# Patient Record
Sex: Female | Born: 1935 | Race: White | Hispanic: No | Marital: Married | Smoking: Former smoker
Health system: Southern US, Community
[De-identification: ages and names within clinical notes are randomized; demographics above are authoritative.]

## PROBLEM LIST (undated history)

## (undated) DIAGNOSIS — IMO0001 Reserved for inherently not codable concepts without codable children: Secondary | ICD-10-CM

## (undated) DIAGNOSIS — M1A30X0 Chronic gout due to renal impairment, unspecified site, without tophus (tophi): Secondary | ICD-10-CM

## (undated) DIAGNOSIS — J189 Pneumonia, unspecified organism: Secondary | ICD-10-CM

## (undated) DIAGNOSIS — I10 Essential (primary) hypertension: Secondary | ICD-10-CM

## (undated) DIAGNOSIS — D649 Anemia, unspecified: Secondary | ICD-10-CM

## (undated) DIAGNOSIS — E669 Obesity, unspecified: Secondary | ICD-10-CM

## (undated) DIAGNOSIS — G4733 Obstructive sleep apnea (adult) (pediatric): Secondary | ICD-10-CM

## (undated) DIAGNOSIS — E039 Hypothyroidism, unspecified: Secondary | ICD-10-CM

## (undated) DIAGNOSIS — I4891 Unspecified atrial fibrillation: Secondary | ICD-10-CM

## (undated) DIAGNOSIS — I5032 Chronic diastolic (congestive) heart failure: Secondary | ICD-10-CM

## (undated) DIAGNOSIS — J9610 Chronic respiratory failure, unspecified whether with hypoxia or hypercapnia: Secondary | ICD-10-CM

## (undated) DIAGNOSIS — K5790 Diverticulosis of intestine, part unspecified, without perforation or abscess without bleeding: Secondary | ICD-10-CM

## (undated) DIAGNOSIS — N189 Chronic kidney disease, unspecified: Secondary | ICD-10-CM

## (undated) DIAGNOSIS — J449 Chronic obstructive pulmonary disease, unspecified: Secondary | ICD-10-CM

## (undated) DIAGNOSIS — K219 Gastro-esophageal reflux disease without esophagitis: Secondary | ICD-10-CM

## (undated) DIAGNOSIS — I739 Peripheral vascular disease, unspecified: Secondary | ICD-10-CM

## (undated) HISTORY — DX: Chronic respiratory failure, unspecified whether with hypoxia or hypercapnia: J96.10

## (undated) HISTORY — DX: Chronic kidney disease, unspecified: N18.9

## (undated) HISTORY — DX: Hypothyroidism, unspecified: E03.9

## (undated) HISTORY — DX: Obesity, unspecified: E66.9

## (undated) HISTORY — DX: Chronic obstructive pulmonary disease, unspecified: J44.9

## (undated) HISTORY — DX: Anemia, unspecified: D64.9

## (undated) HISTORY — DX: Pneumonia, unspecified organism: J18.9

## (undated) HISTORY — DX: Unspecified atrial fibrillation: I48.91

## (undated) HISTORY — DX: Reserved for inherently not codable concepts without codable children: IMO0001

## (undated) HISTORY — DX: Chronic diastolic (congestive) heart failure: I50.32

## (undated) HISTORY — PX: TRACHEOSTOMY: SUR1362

## (undated) HISTORY — DX: Diverticulosis of intestine, part unspecified, without perforation or abscess without bleeding: K57.90

## (undated) HISTORY — DX: Obstructive sleep apnea (adult) (pediatric): G47.33

## (undated) HISTORY — DX: Chronic gout due to renal impairment, unspecified site, without tophus (tophi): M1A.30X0

---

## 2006-06-12 ENCOUNTER — Ambulatory Visit: Payer: Self-pay | Admitting: Critical Care Medicine

## 2008-07-10 DIAGNOSIS — I4891 Unspecified atrial fibrillation: Secondary | ICD-10-CM

## 2008-07-10 HISTORY — DX: Unspecified atrial fibrillation: I48.91

## 2008-07-22 ENCOUNTER — Inpatient Hospital Stay (HOSPITAL_COMMUNITY): Admission: EM | Admit: 2008-07-22 | Discharge: 2008-08-07 | Payer: Self-pay | Admitting: Emergency Medicine

## 2008-07-22 ENCOUNTER — Ambulatory Visit: Payer: Self-pay | Admitting: Pulmonary Disease

## 2008-07-22 ENCOUNTER — Ambulatory Visit: Payer: Self-pay | Admitting: Cardiovascular Disease

## 2008-07-28 ENCOUNTER — Encounter (INDEPENDENT_AMBULATORY_CARE_PROVIDER_SITE_OTHER): Payer: Self-pay | Admitting: Cardiovascular Disease

## 2008-07-31 ENCOUNTER — Ambulatory Visit: Payer: Self-pay | Admitting: Physical Medicine & Rehabilitation

## 2008-08-04 ENCOUNTER — Ambulatory Visit: Payer: Self-pay | Admitting: Vascular Surgery

## 2008-08-09 HISTORY — PX: OTHER SURGICAL HISTORY: SHX169

## 2008-08-11 ENCOUNTER — Encounter: Payer: Self-pay | Admitting: Cardiology

## 2008-08-11 ENCOUNTER — Telehealth: Payer: Self-pay | Admitting: Cardiology

## 2008-08-11 ENCOUNTER — Ambulatory Visit: Payer: Self-pay | Admitting: Cardiovascular Disease

## 2008-08-12 ENCOUNTER — Telehealth: Payer: Self-pay | Admitting: Cardiology

## 2008-08-14 ENCOUNTER — Encounter: Payer: Self-pay | Admitting: Cardiology

## 2008-08-18 ENCOUNTER — Ambulatory Visit (HOSPITAL_COMMUNITY): Admission: RE | Admit: 2008-08-18 | Discharge: 2008-08-18 | Payer: Self-pay | Admitting: General Surgery

## 2008-08-18 ENCOUNTER — Ambulatory Visit: Payer: Self-pay | Admitting: Cardiology

## 2008-08-18 ENCOUNTER — Encounter: Payer: Self-pay | Admitting: Cardiology

## 2008-08-19 ENCOUNTER — Encounter: Payer: Self-pay | Admitting: Cardiology

## 2008-08-19 LAB — CONVERTED CEMR LAB
BUN: 59 mg/dL — ABNORMAL HIGH (ref 6–23)
Calcium: 9.1 mg/dL (ref 8.4–10.5)
Cholesterol: 210 mg/dL — ABNORMAL HIGH (ref 0–200)
Creatinine, Ser: 1.73 mg/dL — ABNORMAL HIGH (ref 0.40–1.20)
HCT: 39.4 % (ref 36.0–46.0)
Hemoglobin: 11.9 g/dL — ABNORMAL LOW (ref 12.0–15.0)
LDL Cholesterol: 124 mg/dL — ABNORMAL HIGH (ref 0–99)
MCHC: 30.2 g/dL (ref 30.0–36.0)
MCV: 88.5 fL (ref 78.0–100.0)
RDW: 16.6 % — ABNORMAL HIGH (ref 11.5–15.5)
Triglycerides: 331 mg/dL — ABNORMAL HIGH (ref <150)

## 2008-08-20 DIAGNOSIS — J439 Emphysema, unspecified: Secondary | ICD-10-CM | POA: Insufficient documentation

## 2008-08-20 DIAGNOSIS — E663 Overweight: Secondary | ICD-10-CM | POA: Insufficient documentation

## 2008-08-20 DIAGNOSIS — N189 Chronic kidney disease, unspecified: Secondary | ICD-10-CM | POA: Insufficient documentation

## 2008-08-20 DIAGNOSIS — E1151 Type 2 diabetes mellitus with diabetic peripheral angiopathy without gangrene: Secondary | ICD-10-CM | POA: Insufficient documentation

## 2008-08-20 DIAGNOSIS — I4891 Unspecified atrial fibrillation: Secondary | ICD-10-CM | POA: Insufficient documentation

## 2008-08-20 DIAGNOSIS — I5032 Chronic diastolic (congestive) heart failure: Secondary | ICD-10-CM | POA: Insufficient documentation

## 2008-08-25 ENCOUNTER — Ambulatory Visit: Payer: Self-pay | Admitting: Cardiology

## 2008-09-02 ENCOUNTER — Telehealth (INDEPENDENT_AMBULATORY_CARE_PROVIDER_SITE_OTHER): Payer: Self-pay

## 2008-09-03 ENCOUNTER — Telehealth: Payer: Self-pay | Admitting: Cardiology

## 2008-09-03 ENCOUNTER — Ambulatory Visit: Payer: Self-pay | Admitting: Cardiology

## 2008-09-03 ENCOUNTER — Ambulatory Visit: Payer: Self-pay

## 2008-09-05 ENCOUNTER — Ambulatory Visit: Payer: Self-pay | Admitting: Cardiology

## 2008-09-08 ENCOUNTER — Emergency Department (HOSPITAL_COMMUNITY): Admission: EM | Admit: 2008-09-08 | Discharge: 2008-09-08 | Payer: Self-pay | Admitting: Emergency Medicine

## 2008-09-09 ENCOUNTER — Telehealth (INDEPENDENT_AMBULATORY_CARE_PROVIDER_SITE_OTHER): Payer: Self-pay | Admitting: Physician Assistant

## 2008-09-10 ENCOUNTER — Telehealth: Payer: Self-pay | Admitting: Family Medicine

## 2008-09-11 ENCOUNTER — Ambulatory Visit: Payer: Self-pay | Admitting: Cardiovascular Disease

## 2008-09-11 ENCOUNTER — Ambulatory Visit: Payer: Self-pay | Admitting: Internal Medicine

## 2008-09-11 ENCOUNTER — Ambulatory Visit: Payer: Self-pay | Admitting: Pulmonary Disease

## 2008-09-11 ENCOUNTER — Ambulatory Visit: Payer: Self-pay | Admitting: Family Medicine

## 2008-09-11 ENCOUNTER — Inpatient Hospital Stay (HOSPITAL_COMMUNITY): Admission: EM | Admit: 2008-09-11 | Discharge: 2008-09-19 | Payer: Self-pay | Admitting: Emergency Medicine

## 2008-09-11 DIAGNOSIS — R000 Tachycardia, unspecified: Secondary | ICD-10-CM | POA: Insufficient documentation

## 2008-09-12 ENCOUNTER — Encounter (INDEPENDENT_AMBULATORY_CARE_PROVIDER_SITE_OTHER): Payer: Self-pay | Admitting: Internal Medicine

## 2008-09-22 ENCOUNTER — Ambulatory Visit: Payer: Self-pay | Admitting: Family Medicine

## 2008-09-23 ENCOUNTER — Ambulatory Visit: Payer: Self-pay | Admitting: Critical Care Medicine

## 2008-09-23 LAB — CONVERTED CEMR LAB
Alkaline Phosphatase: 53 units/L (ref 39–117)
Basophils Absolute: 0 10*3/uL (ref 0.0–0.1)
Bilirubin, Direct: 0 mg/dL (ref 0.0–0.3)
Calcium: 9 mg/dL (ref 8.4–10.5)
Eosinophils Absolute: 0.2 10*3/uL (ref 0.0–0.7)
Glucose, Bld: 108 mg/dL — ABNORMAL HIGH (ref 70–99)
HCT: 29.2 % — ABNORMAL LOW (ref 36.0–46.0)
Lymphs Abs: 1.7 10*3/uL (ref 0.7–4.0)
MCV: 83.2 fL (ref 78.0–100.0)
Monocytes Absolute: 0.6 10*3/uL (ref 0.1–1.0)
Monocytes Relative: 5 % (ref 3.0–12.0)
Platelets: 288 10*3/uL (ref 150.0–400.0)
Potassium: 4 meq/L (ref 3.5–5.1)
RDW: 15.3 % — ABNORMAL HIGH (ref 11.5–14.6)
Sodium: 134 meq/L — ABNORMAL LOW (ref 135–145)
TSH: 2.76 microintl units/mL (ref 0.35–5.50)
Total Protein: 7.4 g/dL (ref 6.0–8.3)

## 2008-09-24 ENCOUNTER — Encounter: Payer: Self-pay | Admitting: Family Medicine

## 2008-09-24 ENCOUNTER — Telehealth: Payer: Self-pay | Admitting: Family Medicine

## 2008-09-29 ENCOUNTER — Ambulatory Visit: Payer: Self-pay | Admitting: Family Medicine

## 2008-09-29 DIAGNOSIS — G4730 Sleep apnea, unspecified: Secondary | ICD-10-CM | POA: Insufficient documentation

## 2008-09-30 LAB — CONVERTED CEMR LAB
Albumin: 3.4 g/dL — ABNORMAL LOW (ref 3.5–5.2)
Calcium: 9.1 mg/dL (ref 8.4–10.5)
Creatinine, Ser: 3.6 mg/dL — ABNORMAL HIGH (ref 0.4–1.2)
Glucose, Bld: 170 mg/dL — ABNORMAL HIGH (ref 70–99)

## 2008-10-01 ENCOUNTER — Ambulatory Visit: Payer: Self-pay | Admitting: Cardiology

## 2008-10-01 ENCOUNTER — Encounter: Payer: Self-pay | Admitting: Family Medicine

## 2008-10-01 ENCOUNTER — Encounter: Payer: Self-pay | Admitting: Cardiology

## 2008-10-01 DIAGNOSIS — I10 Essential (primary) hypertension: Secondary | ICD-10-CM | POA: Insufficient documentation

## 2008-10-02 ENCOUNTER — Ambulatory Visit: Payer: Self-pay | Admitting: Family Medicine

## 2008-10-02 ENCOUNTER — Ambulatory Visit: Payer: Self-pay | Admitting: Critical Care Medicine

## 2008-10-06 ENCOUNTER — Ambulatory Visit: Payer: Self-pay | Admitting: Family Medicine

## 2008-10-07 ENCOUNTER — Ambulatory Visit: Payer: Self-pay | Admitting: Pulmonary Disease

## 2008-10-07 DIAGNOSIS — G4733 Obstructive sleep apnea (adult) (pediatric): Secondary | ICD-10-CM | POA: Insufficient documentation

## 2008-10-09 ENCOUNTER — Ambulatory Visit: Payer: Self-pay | Admitting: Family Medicine

## 2008-10-09 ENCOUNTER — Encounter: Payer: Self-pay | Admitting: Critical Care Medicine

## 2008-10-16 ENCOUNTER — Ambulatory Visit: Payer: Self-pay | Admitting: Family Medicine

## 2008-10-16 LAB — CONVERTED CEMR LAB: INR: 3.4

## 2008-10-22 ENCOUNTER — Encounter: Payer: Self-pay | Admitting: Cardiology

## 2008-10-22 ENCOUNTER — Ambulatory Visit: Payer: Self-pay | Admitting: Family Medicine

## 2008-10-23 ENCOUNTER — Encounter: Payer: Self-pay | Admitting: Cardiology

## 2008-10-23 ENCOUNTER — Telehealth (INDEPENDENT_AMBULATORY_CARE_PROVIDER_SITE_OTHER): Payer: Self-pay | Admitting: *Deleted

## 2008-10-27 ENCOUNTER — Encounter: Payer: Self-pay | Admitting: Critical Care Medicine

## 2008-11-08 ENCOUNTER — Telehealth: Payer: Self-pay | Admitting: Nurse Practitioner

## 2008-11-11 ENCOUNTER — Ambulatory Visit: Payer: Self-pay | Admitting: Cardiology

## 2008-11-18 ENCOUNTER — Encounter: Admission: RE | Admit: 2008-11-18 | Discharge: 2009-02-16 | Payer: Self-pay | Admitting: Family Medicine

## 2008-11-19 ENCOUNTER — Telehealth (INDEPENDENT_AMBULATORY_CARE_PROVIDER_SITE_OTHER): Payer: Self-pay | Admitting: *Deleted

## 2008-11-21 ENCOUNTER — Ambulatory Visit: Payer: Self-pay | Admitting: Family Medicine

## 2008-11-21 LAB — CONVERTED CEMR LAB: Prothrombin Time: 15.9 s

## 2008-11-24 ENCOUNTER — Encounter: Payer: Self-pay | Admitting: Family Medicine

## 2008-11-27 ENCOUNTER — Encounter: Payer: Self-pay | Admitting: Cardiology

## 2008-12-01 ENCOUNTER — Encounter: Payer: Self-pay | Admitting: Cardiology

## 2008-12-04 ENCOUNTER — Encounter: Payer: Self-pay | Admitting: Pulmonary Disease

## 2008-12-04 ENCOUNTER — Ambulatory Visit: Payer: Self-pay | Admitting: Critical Care Medicine

## 2008-12-10 ENCOUNTER — Telehealth (INDEPENDENT_AMBULATORY_CARE_PROVIDER_SITE_OTHER): Payer: Self-pay | Admitting: *Deleted

## 2008-12-11 ENCOUNTER — Ambulatory Visit: Payer: Self-pay | Admitting: Family Medicine

## 2008-12-11 LAB — CONVERTED CEMR LAB
INR: 1.7
Prothrombin Time: 15.9 s

## 2008-12-12 ENCOUNTER — Encounter: Payer: Self-pay | Admitting: Critical Care Medicine

## 2009-01-29 ENCOUNTER — Encounter: Payer: Self-pay | Admitting: Family Medicine

## 2009-02-10 ENCOUNTER — Ambulatory Visit: Payer: Self-pay | Admitting: Cardiology

## 2009-02-13 ENCOUNTER — Encounter: Payer: Self-pay | Admitting: Cardiology

## 2009-02-23 ENCOUNTER — Encounter: Payer: Self-pay | Admitting: Cardiology

## 2009-03-01 LAB — CONVERTED CEMR LAB
ALT: 14 units/L (ref 0–35)
BUN: 69 mg/dL — ABNORMAL HIGH (ref 6–23)
CO2: 24 meq/L (ref 19–32)
Cholesterol: 249 mg/dL — ABNORMAL HIGH (ref 0–200)
Creatinine, Ser: 2.39 mg/dL — ABNORMAL HIGH (ref 0.40–1.20)
HDL: 23 mg/dL — ABNORMAL LOW (ref >39)
Total Bilirubin: 0.5 mg/dL (ref 0.3–1.2)
Total CHOL/HDL Ratio: 10.8

## 2009-03-18 ENCOUNTER — Ambulatory Visit: Payer: Self-pay | Admitting: Critical Care Medicine

## 2009-03-20 ENCOUNTER — Encounter: Payer: Self-pay | Admitting: Cardiology

## 2009-05-27 ENCOUNTER — Telehealth: Payer: Self-pay | Admitting: Cardiology

## 2009-06-02 ENCOUNTER — Ambulatory Visit: Payer: Self-pay | Admitting: Cardiovascular Disease

## 2009-06-02 LAB — CONVERTED CEMR LAB: POC INR: 1

## 2009-06-09 ENCOUNTER — Ambulatory Visit: Payer: Self-pay | Admitting: Cardiovascular Disease

## 2009-07-15 ENCOUNTER — Telehealth: Payer: Self-pay | Admitting: Critical Care Medicine

## 2009-07-16 ENCOUNTER — Ambulatory Visit: Payer: Self-pay | Admitting: Critical Care Medicine

## 2009-07-16 DIAGNOSIS — R892 Abnormal level of other drugs, medicaments and biological substances in specimens from other organs, systems and tissues: Secondary | ICD-10-CM | POA: Insufficient documentation

## 2009-07-17 ENCOUNTER — Telehealth: Payer: Self-pay | Admitting: Critical Care Medicine

## 2009-07-17 LAB — CONVERTED CEMR LAB
ALT: 11 units/L (ref 0–35)
AST: 12 units/L (ref 0–37)
Albumin: 4.4 g/dL (ref 3.5–5.2)
Alkaline Phosphatase: 66 units/L (ref 39–117)
BUN: 58 mg/dL — ABNORMAL HIGH (ref 6–23)
CO2: 30 meq/L (ref 19–32)
Chloride: 96 meq/L (ref 96–112)
Glucose, Bld: 116 mg/dL — ABNORMAL HIGH (ref 70–99)
Potassium: 4.4 meq/L (ref 3.5–5.3)
Sodium: 140 meq/L (ref 135–145)
Total Bilirubin: 0.5 mg/dL (ref 0.3–1.2)
Total CK: 44 units/L (ref 7–177)
Total Protein: 7.2 g/dL (ref 6.0–8.3)

## 2009-07-20 ENCOUNTER — Ambulatory Visit: Payer: Self-pay | Admitting: Internal Medicine

## 2009-07-23 ENCOUNTER — Ambulatory Visit: Payer: Self-pay | Admitting: Cardiology

## 2009-07-27 ENCOUNTER — Ambulatory Visit: Payer: Self-pay | Admitting: Internal Medicine

## 2009-07-27 LAB — CONVERTED CEMR LAB: POC INR: 3.3

## 2009-08-03 ENCOUNTER — Ambulatory Visit: Payer: Self-pay | Admitting: Family Medicine

## 2009-08-03 DIAGNOSIS — E039 Hypothyroidism, unspecified: Secondary | ICD-10-CM | POA: Insufficient documentation

## 2009-08-10 ENCOUNTER — Ambulatory Visit: Payer: Self-pay | Admitting: Cardiovascular Disease

## 2009-08-18 ENCOUNTER — Ambulatory Visit: Payer: Self-pay | Admitting: Cardiovascular Disease

## 2009-09-01 ENCOUNTER — Ambulatory Visit: Payer: Self-pay | Admitting: Cardiovascular Disease

## 2009-09-23 ENCOUNTER — Encounter: Payer: Self-pay | Admitting: Critical Care Medicine

## 2009-09-29 ENCOUNTER — Ambulatory Visit: Payer: Self-pay | Admitting: Cardiovascular Disease

## 2009-10-21 ENCOUNTER — Encounter: Payer: Self-pay | Admitting: Critical Care Medicine

## 2009-10-28 ENCOUNTER — Encounter (INDEPENDENT_AMBULATORY_CARE_PROVIDER_SITE_OTHER): Payer: Self-pay | Admitting: Pharmacist

## 2009-11-12 ENCOUNTER — Ambulatory Visit: Payer: Self-pay | Admitting: Cardiovascular Disease

## 2009-12-02 ENCOUNTER — Ambulatory Visit: Payer: Self-pay | Admitting: Cardiovascular Disease

## 2009-12-02 LAB — CONVERTED CEMR LAB: POC INR: 2.9

## 2009-12-30 ENCOUNTER — Ambulatory Visit: Payer: Self-pay | Admitting: Cardiovascular Disease

## 2009-12-30 LAB — CONVERTED CEMR LAB: POC INR: 1.2

## 2010-01-24 ENCOUNTER — Encounter: Payer: Self-pay | Admitting: Family Medicine

## 2010-01-25 ENCOUNTER — Encounter: Payer: Self-pay | Admitting: Family Medicine

## 2010-01-26 ENCOUNTER — Encounter: Payer: Self-pay | Admitting: Family Medicine

## 2010-01-29 ENCOUNTER — Encounter: Payer: Self-pay | Admitting: Family Medicine

## 2010-01-30 ENCOUNTER — Encounter: Payer: Self-pay | Admitting: Family Medicine

## 2010-01-31 ENCOUNTER — Encounter: Payer: Self-pay | Admitting: Family Medicine

## 2010-02-01 ENCOUNTER — Encounter: Payer: Self-pay | Admitting: Family Medicine

## 2010-02-04 ENCOUNTER — Encounter: Payer: Self-pay | Admitting: Family Medicine

## 2010-02-09 ENCOUNTER — Encounter: Payer: Self-pay | Admitting: Family Medicine

## 2010-02-10 ENCOUNTER — Encounter: Payer: Self-pay | Admitting: Family Medicine

## 2010-03-02 ENCOUNTER — Telehealth: Payer: Self-pay | Admitting: Family Medicine

## 2010-03-02 ENCOUNTER — Encounter: Payer: Self-pay | Admitting: Family Medicine

## 2010-03-03 ENCOUNTER — Telehealth: Payer: Self-pay | Admitting: Family Medicine

## 2010-03-08 ENCOUNTER — Encounter: Payer: Self-pay | Admitting: Family Medicine

## 2010-03-12 ENCOUNTER — Encounter: Payer: Self-pay | Admitting: Family Medicine

## 2010-03-15 ENCOUNTER — Ambulatory Visit: Payer: Self-pay | Admitting: Family Medicine

## 2010-03-15 DIAGNOSIS — J9600 Acute respiratory failure, unspecified whether with hypoxia or hypercapnia: Secondary | ICD-10-CM

## 2010-03-15 DIAGNOSIS — N179 Acute kidney failure, unspecified: Secondary | ICD-10-CM

## 2010-03-16 ENCOUNTER — Telehealth (INDEPENDENT_AMBULATORY_CARE_PROVIDER_SITE_OTHER): Payer: Self-pay | Admitting: *Deleted

## 2010-03-16 LAB — CONVERTED CEMR LAB
ALT: 21 units/L (ref 0–35)
AST: 18 units/L (ref 0–37)
Alkaline Phosphatase: 52 units/L (ref 39–117)
BUN: 65 mg/dL — ABNORMAL HIGH (ref 6–23)
Basophils Absolute: 0.1 10*3/uL (ref 0.0–0.1)
Calcium: 8.7 mg/dL (ref 8.4–10.5)
Eosinophils Relative: 0.6 % (ref 0.0–5.0)
GFR calc non Af Amer: 19.54 mL/min — ABNORMAL LOW (ref >60.00)
Glucose, Bld: 132 mg/dL — ABNORMAL HIGH (ref 70–99)
HCT: 31.3 % — ABNORMAL LOW (ref 36.0–46.0)
Hemoglobin: 10.3 g/dL — ABNORMAL LOW (ref 12.0–15.0)
Lymphocytes Relative: 13.9 % (ref 12.0–46.0)
Lymphs Abs: 1.7 10*3/uL (ref 0.7–4.0)
Monocytes Relative: 4.9 % (ref 3.0–12.0)
Platelets: 262 10*3/uL (ref 150.0–400.0)
Potassium: 3.9 meq/L (ref 3.5–5.1)
RDW: 19.8 % — ABNORMAL HIGH (ref 11.5–14.6)
Sodium: 142 meq/L (ref 135–145)
Total Bilirubin: 0.3 mg/dL (ref 0.3–1.2)
WBC: 11.9 10*3/uL — ABNORMAL HIGH (ref 4.5–10.5)

## 2010-03-22 ENCOUNTER — Ambulatory Visit: Payer: Self-pay | Admitting: Cardiology

## 2010-03-22 ENCOUNTER — Encounter: Payer: Self-pay | Admitting: Cardiology

## 2010-03-22 DIAGNOSIS — E785 Hyperlipidemia, unspecified: Secondary | ICD-10-CM

## 2010-03-23 ENCOUNTER — Encounter: Payer: Self-pay | Admitting: Family Medicine

## 2010-03-24 ENCOUNTER — Encounter: Payer: Self-pay | Admitting: Family Medicine

## 2010-03-24 ENCOUNTER — Telehealth: Payer: Self-pay | Admitting: Family Medicine

## 2010-03-25 ENCOUNTER — Ambulatory Visit: Payer: Self-pay | Admitting: Critical Care Medicine

## 2010-03-29 LAB — CONVERTED CEMR LAB: T3, Free: 1.9 pg/mL — ABNORMAL LOW (ref 2.3–4.2)

## 2010-03-31 ENCOUNTER — Encounter: Payer: Self-pay | Admitting: Cardiovascular Disease

## 2010-04-22 ENCOUNTER — Ambulatory Visit
Admission: RE | Admit: 2010-04-22 | Discharge: 2010-04-22 | Payer: Self-pay | Source: Home / Self Care | Attending: Family Medicine | Admitting: Family Medicine

## 2010-04-22 DIAGNOSIS — J069 Acute upper respiratory infection, unspecified: Secondary | ICD-10-CM | POA: Insufficient documentation

## 2010-04-22 LAB — CONVERTED CEMR LAB: INR: 1.7

## 2010-04-28 ENCOUNTER — Ambulatory Visit: Admit: 2010-04-28 | Payer: Self-pay | Admitting: Cardiology

## 2010-05-06 ENCOUNTER — Ambulatory Visit: Admit: 2010-05-06 | Payer: Self-pay | Admitting: Family Medicine

## 2010-05-09 LAB — CONVERTED CEMR LAB
BUN: 96 mg/dL (ref 6–23)
CO2: 35 meq/L — ABNORMAL HIGH (ref 19–32)
Calcium: 9.8 mg/dL (ref 8.4–10.5)
Chloride: 100 meq/L (ref 96–112)
Creatinine, Ser: 2.83 mg/dL — ABNORMAL HIGH (ref 0.40–1.20)
Free T4: 1.04 ng/dL (ref 0.80–1.80)
Glucose, Bld: 117 mg/dL — ABNORMAL HIGH (ref 70–99)
Glucose, Bld: 136 mg/dL — ABNORMAL HIGH (ref 70–99)
Hgb A1c MFr Bld: 7.9 % — ABNORMAL HIGH (ref 4.6–6.5)
INR: 3.3
Potassium: 4 meq/L (ref 3.5–5.1)
Prothrombin Time: 20.1 s
Sodium: 142 meq/L (ref 135–145)
T3, Free: 2 pg/mL — ABNORMAL LOW (ref 2.3–4.2)
TSH: 4.889 microintl units/mL — ABNORMAL HIGH (ref 0.350–4.500)

## 2010-05-13 ENCOUNTER — Encounter: Payer: Self-pay | Admitting: Family Medicine

## 2010-05-13 ENCOUNTER — Ambulatory Visit (INDEPENDENT_AMBULATORY_CARE_PROVIDER_SITE_OTHER): Payer: Medicare Other

## 2010-05-13 DIAGNOSIS — Z5181 Encounter for therapeutic drug level monitoring: Secondary | ICD-10-CM

## 2010-05-13 DIAGNOSIS — Z79899 Other long term (current) drug therapy: Secondary | ICD-10-CM

## 2010-05-13 DIAGNOSIS — I4891 Unspecified atrial fibrillation: Secondary | ICD-10-CM

## 2010-05-13 LAB — CONVERTED CEMR LAB: Prothrombin Time: 34.4 s

## 2010-05-13 NOTE — Medication Information (Signed)
Summary: CCR/AMD  Anticoagulant Therapy  Managed by: Cloyde Reams, RN, BSN Referring MD: [place] PCP: Hannah Beat MD Supervising MD: Mariah Milling Indication 1: Atrial fibrillation Lab Used: [facility] [place] Site: [place] INR POC 3.5 INR RANGE 2.0-3.0    Bleeding/hemorrhagic complications: no     Any changes in medication regimen? no     Any missed doses?: no       Is patient compliant with meds? yes       Allergies: 1)  ! Sulfa  Anticoagulation Management History:      The patient is taking warfarin and comes in today for a routine follow up visit.  Positive risk factors for bleeding include an age of 77 years or older and presence of serious comorbidities.  The bleeding index is 'intermediate risk'.  Positive CHADS2 values include History of CHF, History of HTN, Age > 59 years old, and History of Diabetes.  Her last INR was 1.0.  Anticoagulation responsible provider: [place].  INR POC: 3.5.  Cuvette Lot#: [id number].  Exp: 11/2010.    Anticoagulation Management Assessment/Plan:      The patient's current anticoagulation dose is Warfarin sodium 5 mg tabs: Use as directed by Anticoagulation Clinic.  The target INR is 2.0-3.0.  The next INR is due 09/15/2009.  Anticoagulation instructions were given to patient.  Results were reviewed/authorized by Cloyde Reams, RN, BSN.  She was notified by Cloyde Reams RN.         Prior Anticoagulation Instructions: INR 3.2  Skip today's dosage of coumadin, then start taking 1/2 tablet daily except 1 tablet on Thursdays.  Recheck in 2 weeks.    Current Anticoagulation Instructions: INR 3.5  Skip tomorrow's dosage of coumadin, then start taking 1/2 tablet daily.  Recheck in 2 weeks.

## 2010-05-13 NOTE — Assessment & Plan Note (Signed)
Summary: Pulmonary OV   Copy to:  [facility] [place]/ Dalton Mclean Primary Ehren Berisha/Referring Alixander Rallis:  Hannah Beat MD  CC:  Follow up to discuss prednisone per PCP request.  Pt currently on prednsione 10mg  daily.  Pt states breathing is doing "good."  Typically no SOB, wheezing, chest tightness, and cough.  Recently in [facility] for 5 wks. .  History of Present Illness: Pulmonary OV This pt not seen >46yr.  Hx in interim as below: Hx per PCP as below:  77 year old female with a history of COPD, history of antral fibrillation with a history of RVR with multiple history of respiratory failure advance, also a history of chronic renal failure followed by Dr. Rayvon Char, most recent prior creatinine was 2.4.  Questionable history of new onset type 2 diabetes during severe hospitalization at the regional.  Patient was hospitalized from January 24, 2010 through February 10, 2010.  Primary diagnosis and admission was 4 a MRSA pneumonia and subsequent respiratory failure. Additionally, she had renal failure, and a supratherapeutic INR. Acute COPD exacerbation secondary to her MRSA and pneumonia as well.  Discharge creatinine was 2.5.  Also notable that the CBC at discharge had a hemoglobin of 8 and a hematocrit of 27.  Bacterial sputum from January 29, 2010 grew MRSA.  Blood cultures were negative x2. Pneumococcal and Legionella antigens were negative  Discharge medication list is different from our list and different from the medications that I had had the patient on as well as pulmonary and cardiology.March 25, 2010 12:04 PM THis pt while staying  at [facility] in [place] and adm 10/16- 11/2 for PNA and sepsis.  THis pt then d/c to snf 11/2 until 11/16 and then d/c to home  several weeks ago. This pt now is better. There is no cough. There is no wheeze. There is no chest pain. There is no abdominal pain.    This pt is now at baseline. Pulmonary F/U 77 yo WF diastolic CHF, OSA, CKD, severe  COPD  09/23/08:  s/p hosp  6/3 -  6/11 for pneumonia with  acute on chronic resp failure,  diastolic chf, CAF, acute on chronic renal insuff BL Ser Cr 2.8,  prior prolonged vent for ARDS/trach 2005 Pt with severe COPD with primary emphysematous component.   Pt now on home O2,  with spiriva.  Since d/c has sl cough but is better on spiriva.  Off all abx.  also on advair.  Off ace inhibitor.   Cough is dry.  Dyspnea is better.  No chest pain.  Has no wheeze.  No f/c/s.  Last seen by PW for copd rx 2008.   December 04, 2008 2:21 PM Pt is doing better with less cough.  No mucous.  No chest pain.  No real wheeze. She questions whether she still needs nocturnal oxygen therapy.   There are not any alleviating or precipitating factors noted.  The symptoms do not generally fluctuate. Pt denies any significant sore throat, nasal congestion or excess secretions, fever, chills, sweats, unintended weight loss, pleurtic or exertional chest pain, orthopnea PND, or leg swelling Pt denies any increase in rescue therapy over baseline, denies waking up needing it or having any early am or nocturnal exacerbations of coughing/wheezing/or dyspnea. She saw [facility] for sleep eval but never had apnea link study completed.  July 16, 2009 12:11 PM No new issues. Dyspnea is stable,  No cough no wheeze,  no excess mucous.  recent pfts stable  Pt denies any significant sore throat,  nasal congestion or excess secretions, fever, chills, sweats, unintended weight loss, pleurtic or exertional chest pain, orthopnea PND, or leg swelling Pt denies any increase in rescue therapy over baseline, denies waking up needing it or having any early am or nocturnal exacerbations of coughing/wheezing/or dyspnea. ONO on RA revealed ongoing need for nocturnal oxygen    Preventive Screening-Counseling & Management  Alcohol-Tobacco     Smoking Status: quit > 6 months     Packs/Day: 1.0  Current Medications (verified): 1)  Spiriva Handihaler 18  Mcg Caps (Tiotropium Bromide Monohydrate) .... Once Daily 2)  Amlodipine Besylate 10 Mg Tabs (Amlodipine Besylate) .... Take One Tablet By Mouth Daily 3)  Amiodarone Hcl 200 Mg Tabs (Amiodarone Hcl) .Marland Kitchen.. 1 Tab Daily 4)  Advair Diskus 250-50 Mcg/dose Misc (Fluticasone-Salmeterol) .Marland Kitchen.. 1 Puff Two Times A Day 5)  Oxygen .... 2l At Bedtime and As Needed During The Day 6)  Magnesium Oxide 400 Mg Tabs (Magnesium Oxide) .... Take 1 Tablet By Mouth Three Times A Day 7)  Metoprolol Tartrate 25 Mg Tabs (Metoprolol Tartrate) .... Take 1 Tablet By Mouth Two Times A Day 8)  Prednisone 10 Mg Tabs (Prednisone) .... Take 1 Tablet By Mouth Once A Day 9)  Ranitidine Hcl 150 Mg Tabs (Ranitidine Hcl) .... Take 1 Tablet By Mouth Once A Day 10)  Xopenex 0.63 Mg/63ml Nebu (Levalbuterol Hcl) .... One Inhalation As Needed. 11)  Coumadin 2 Mg Tabs (Warfarin Sodium) .... As Directed 12)  Furosemide 40 Mg Tabs (Furosemide) .... Take 1 Tablet By Mouth Two Times A Day 13)  Coumadin 5 Mg Tabs (Warfarin Sodium) .... As Directed 14)  Coumadin 6 Mg Tabs (Warfarin Sodium) .... As Directed 15)  Ventolin Hfa 108 (90 Base) Mcg/act Aers (Albuterol Sulfate) .Marland Kitchen.. 1-2 Puffs Every 6 Hours As Needed  Allergies (verified): 1)  ! Sulfa  Past History:  Past medical, surgical, family and social histories (including risk factors) reviewed, and no changes noted (except as noted below).  Past Medical History: Reviewed history from 03/22/2010 and no changes required. 1. Type II diabetes 2. Obesity. 3. Moderate COPD.  PFTs (12/10): FVC 67%, FEV1 58%, ratio 60%, TLC 95%, RV 138%, DLCO 43% (moderate obstructive defect).  4. Diastolic congestive heart failure.  Most recent echo April 2010 showed mild LVH, EF 55-60%, mild left atrial enlargement, and mild mitral regurgitation. 5. Atrial fibrillation. 07/2008,  failed cardioversion with TEE guidance in May 2010.  Patient went back into NSR by 8/10 with amiodarone.  6. Chronic kidney disease, Cr  2.4 when last checked.  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  7. History of pneumonia with ARDS in 2005, MRSA PNA with prolonged hospitalization in [facility] in 11/11.  8. 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 Possible obstructive sleep apnea.  When the patient was sedated for TEE in May 2010, she did develop some upper airway obstruction. 10. Adenosine myoview (5/10):  Normal LV EF and no scar or ischemia (normal stress). 11. Anemia of chronic disease 12. Hypothyroidism 13. Gout   Primary = Dr. Patsy Lager Cardiology = Dr. Shirlee Latch Pulmonology = Dr. Delford Field Nephrology = Dr. Arrie Aran  Past Surgical History: Reviewed history from 03/15/2010 and no changes required. h/o tracheostomy ARDS / ICU admission, Trach (2005) Resp Failure, MRSA PNA (02/2010) Microperforation of jejunem, ICU admission, 08/2008 Acute on Chronic renal failure, 08/2008 COPD exacerbation, mild respiratory failure, 08/2008 A. Fib with RVR, 08/2008  09/2008, ICU Admission, Intubated: Resp failure, PNA, A. Fib, RF 08/2008, Adenosine Myoview, EF 68%  Echo, 07/2008, MR, EF 55%, LAE, mild LVH  06/2006 PFT's: LABORATORY DATA:  Spirometry was obtained today in the office, revealing severe obstructive defect, an FEV1 of 48% predicted, FVC of 61% predicted, FEV1:FVC ratio of 78%.  The patient ambulates around the office and drops to 88%, but no lower after 3 laps.   Past Pulmonary History:  Pulmonary History: Pulmonary Function Test  Date: 10/02/2008 Height (in.): 63 Gender: Female  Pre-Spirometry  FVC     Value: 1.85 L/min   Pred: 2.68 L/min     % Pred: 69 % FEV1     Value: 1.05 L     Pred: 1.86 L     % Pred: 57 % FEV1/FVC   Value: 57 %     Pred: 71 %     FEF 25-75   Value: 0.42 L/min   Pred: 2.11 L/min     % Pred: 20 %  Post-Spirometry  FVC     Value: 1.93 L/min   Pred: 2.68 L/min     % Pred: 72 % FEV1     Value: 1.09 L     Pred: 1.86 L     % Pred: 59 % FEV1/FVC   Value: 57 %      Pred: 71 %     FEF 25-75   Value: 0.42 L/min   Pred: 2.11 L/min     % Pred: 20 %  Lung Volumes  TLC     Value: 4.69 L   % Pred: 98 % RV     Value: 2.84 L   % Pred: 143 % DLCO     Value: 8.5 %   % Pred: 38 % DLCO/VA   Value: 3.49 %   % Pred: 103 %  Family History: Reviewed history from 11/11/2008 and no changes required. No FH premature CAD emphysema: mother asthma: mother  Social History: Reviewed history from 10/07/2008 and no changes required. Married with children. Daughter, NP, is involved Tobacco Use - Former. quit 1970, started at age 13.  1 ppd pt is retired.  previously worked as a Production designer, theatre/television/film for [employer]. Regular Exercise - yes Drug Use - no Alcohol Use - no  Review of Systems       The patient complains of shortness of breath with activity.  The patient denies shortness of breath at rest, productive cough, non-productive cough, coughing up blood, chest pain, irregular heartbeats, acid heartburn, indigestion, loss of appetite, weight change, abdominal pain, difficulty swallowing, sore throat, tooth/dental problems, headaches, nasal congestion/difficulty breathing through nose, sneezing, itching, ear ache, anxiety, depression, hand/feet swelling, joint stiffness or pain, rash, change in color of mucus, and fever.    Vital Signs:  Patient profile:   77 year old female Height:      63 inches Weight:      205.31 pounds BMI:     36.50 O2 Sat:      93 % on Room air Temp:     98.1 degrees F oral Pulse rate:   54 / minute BP sitting:   106 / 70  (right arm) Cuff size:   regular  Vitals Entered By: Gweneth Dimitri RN (March 25, 2010 12:01 PM)  O2 Flow:  Room air CC: Follow up to discuss prednisone per PCP request.  Pt currently on prednsione 10mg  daily.  Pt states breathing is doing "good."  Typically no SOB, wheezing, chest tightness, cough.  Recently in [facility] wks.  Comments Medications reviewed with patient Daytime contact  number verified with  patient. Gweneth Dimitri RN  March 25, 2010 11:58 AM    Physical Exam  Additional Exam:  Gen: Pleasant, well-nourished, in no distress,  normal affect ENT: No lesions,  mouth clear,  oropharynx clear, no postnasal drip Neck: No JVD, no TMG, no carotid bruits Lungs: No use of accessory muscles, no dullness to percussion, distant BS   Cardiovascular: RRR, heart sounds normal, no murmur or gallops, no peripheral edema Abdomen: soft and NT, no HSM,  BS normal Musculoskeletal: No deformities, no cyanosis or clubbing Neuro: alert, non focal Skin: Warm, no lesions or rashes    Impression & Recommendations:  Problem # 1:  COPD (ICD-496) Assessment Unchanged stable copd oxygend dependent.   plan  taper prednisone  to off cont inhaled meds   Problem # 2:  OBSTRUCTIVE SLEEP APNEA (ICD-327.23) Assessment: Unchanged This pt is not able to tolerate cpap plan cont nasal oxygen Orders: Est. Patient Level V (11914)  Medications Added to Medication List This Visit: 1)  Prednisone 10 Mg Tabs (Prednisone) .... Reduce prednisone to 1/2 10mg  tablet daily for 5 days then 1/2 tablet every other day for one week then 1/2 tablet twice a week for one week then stop 2)  Coumadin 2 Mg Tabs (Warfarin sodium) .... As directed 3)  Ventolin Hfa 108 (90 Base) Mcg/act Aers (Albuterol sulfate) .Marland Kitchen.. 1-2 puffs every 6 hours as needed  Complete Medication List: 1)  Spiriva Handihaler 18 Mcg Caps (Tiotropium bromide monohydrate) .... Once daily 2)  Amlodipine Besylate 10 Mg Tabs (Amlodipine besylate) .... Take one tablet by mouth daily 3)  Amiodarone Hcl 200 Mg Tabs (Amiodarone hcl) .Marland Kitchen.. 1 tab daily 4)  Advair Diskus 250-50 Mcg/dose Misc (Fluticasone-salmeterol) .Marland Kitchen.. 1 puff two times a day 5)  Oxygen  .... 2l at bedtime and as needed during the day 6)  Magnesium Oxide 400 Mg Tabs (Magnesium oxide) .... Take 1 tablet by mouth three times a day 7)  Metoprolol Tartrate 25 Mg Tabs (Metoprolol tartrate) ....  Take 1 tablet by mouth two times a day 8)  Prednisone 10 Mg Tabs (Prednisone) .... Reduce prednisone to 1/2 10mg  tablet daily for 5 days then 1/2 tablet every other day for one week then 1/2 tablet twice a week for one week then stop 9)  Ranitidine Hcl 150 Mg Tabs (Ranitidine hcl) .... Take 1 tablet by mouth once a day 10)  Xopenex 0.63 Mg/2ml Nebu (Levalbuterol hcl) .... One inhalation as needed. 11)  Coumadin 2 Mg Tabs (Warfarin sodium) .... As directed 12)  Furosemide 40 Mg Tabs (Furosemide) .... Take 1 tablet by mouth two times a day 13)  Coumadin 5 Mg Tabs (Warfarin sodium) .... As directed 14)  Coumadin 6 Mg Tabs (Warfarin sodium) .... As directed 15)  Ventolin Hfa 108 (90 Base) Mcg/act Aers (Albuterol sulfate) .Marland Kitchen.. 1-2 puffs every 6 hours as needed  Patient Instructions: 1)  Reduce prednisone to 1/2 10mg  tablet daily for 5 days then 1/2 tablet every other day for one week then 1/2 tablet twice a week for one week then stop 2)  No other medication changes 3)  Return 2 months [place]

## 2010-05-13 NOTE — Assessment & Plan Note (Signed)
Summary: Pulmonary OV   Copy to:  [facility] [place]/ [facility]clean Primary Provider/Referring Provider:  Hannah Beat MD  CC:  COPD follow up.  Pt states breathing is doing "good."  States she does wheeze occ.  Denies chest tightness and cough.  .  History of Present Illness: Pulmonary F/U 77 yo WF diastolic CHF, OSA, CKD, severe COPD  09/23/08:  s/p hosp  6/3 -  6/11 for pneumonia with  acute on chronic resp failure,  diastolic chf, CAF, acute on chronic renal insuff BL Ser Cr 2.8,  prior prolonged vent for ARDS/trach 2005 Pt with severe COPD with primary emphysematous component.   Pt now on home O2,  with spiriva.  Since d/c has sl cough but is better on spiriva.  Off all abx.  also on advair.  Off ace inhibitor.   Cough is dry.  Dyspnea is better.  No chest pain.  Has no wheeze.  No f/c/s.  Last seen by PW for copd rx 2008.   December 04, 2008 2:21 PM Pt is doing better with less cough.  No mucous.  No chest pain.  No real wheeze. She questions whether she still needs nocturnal oxygen therapy.   There are not any alleviating or precipitating factors noted.  The symptoms do not generally fluctuate. Pt denies any significant sore throat, nasal congestion or excess secretions, fever, chills, sweats, unintended weight loss, pleurtic or exertional chest pain, orthopnea PND, or leg swelling Pt denies any increase in rescue therapy over baseline, denies waking up needing it or having any early am or nocturnal exacerbations of coughing/wheezing/or dyspnea. She saw [facility], [place] for sleep eval but never had apnea link study completed. July 16, 2009 12:11 PM No new issues. Dyspnea is stable,  No cough no wheeze,  no excess mucous.  recent pfts stable  Pt denies any significant sore throat, nasal congestion or excess secretions, fever, chills, sweats, unintended weight loss, pleurtic or exertional chest pain, orthopnea PND, or leg swelling Pt denies any increase in rescue therapy over baseline, denies waking  up needing it or having any early am or nocturnal exacerbations of coughing/wheezing/or dyspnea. ONO on RA revealed ongoing need for nocturnal oxygen   Preventive Screening-Counseling & Management  Alcohol-Tobacco     Smoking Status: quit > 6 months  Current Medications (verified): 1)  Warfarin Sodium 5 Mg Tabs (Warfarin Sodium) .... Use As Directed By Anticoagulation Clinic 2)  Spiriva Handihaler 18 Mcg Caps (Tiotropium Bromide Monohydrate) .... Once Daily 3)  Multivitamins   Tabs (Multiple Vitamin) .... Once Daily 4)  Metoprolol Succinate 25 Mg Xr24h-Tab (Metoprolol Succinate) .Marland Kitchen.. 1 Tab Once Daily 5)  Furosemide 20 Mg Tabs (Furosemide) .... 4 Tabs By Mouth Q Am and 3 Tabs By Mouth Q Pm, and May Add 1 Tablet If Swelling or Weight Gain 6)  Amlodipine Besylate 10 Mg Tabs (Amlodipine Besylate) .... Take One Tablet By Mouth Daily 7)  Lipitor 20 Mg Tabs (Atorvastatin Calcium) .... Take One Tablet By Mouth Daily. 8)  Amiodarone Hcl 200 Mg Tabs (Amiodarone Hcl) .Marland Kitchen.. 1 Tab Daily 9)  Advair Diskus 250-50 Mcg/dose Misc (Fluticasone-Salmeterol) .Marland Kitchen.. 1 Puff Two Times A Day 10)  Proair Hfa 108 (90 Base) Mcg/act  Aers (Albuterol Sulfate) .... 2 Inh Q4h As Needed Shortness of Breath 11)  Duoneb 0.5-2.5 (3) Mg/61ml Soln (Ipratropium-Albuterol) .... Use Two Times A Day As Needed Shortness of Breath 12)  Oxygen .... 2l At Bedtime and As Needed During The Day 13)  Hydralazine Hcl 25  Mg Tabs (Hydralazine Hcl) .... Take One Tablet By Mouth Three Times A Day 14)  Lovaza 1 Gm Caps (Omega-3-Acid Ethyl Esters) .... Take 2 Caps By With Daily  Allergies (verified): 1)  ! Sulfa  Past History:  Past medical, surgical, family and social histories (including risk factors) reviewed, and no changes noted (except as noted below).  Past Medical History: Reviewed history from 02/10/2009 and no changes required. 1. Type 2 diabetes.  2. Obesity. 3. Moderate COPD.  PFTs (6/10): FVC 72%, FEV1 59%, ratio 57%, TLC 98%,  DLCO 38% 4. Diastolic congestive heart failure.  Most recent echo April 2010 showed mild LVH, EF 55-60%, mild left atrial enlargement, and mild mitral regurgitation. 5. Atrial fibrillation. 07/2008,  failed cardioversion with TEE guidance in May 2010.  Patient went back into NSR by 8/10 with amiodarone.  6. Chronic kidney disease, Cr 2.8.  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  7. History of pneumonia with ARDS in 2005.  8. 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 Possible obstructive sleep apnea.  When the patient was sedated for TEE in May 2010, she did develop some upper airway obstruction. 10.Adenosine myoview (5/10):  Normal LV EF and no scar or ischemia     (normal stress). 11. Anemia of chronic disease   Primary = Dr. Patsy Lager Cardiology = Dr. Shirlee Latch Pulmonology = Dr. Delford Field Nephrology = Dr. Arrie Aran  Past Surgical History: Reviewed history from 09/22/2008 and no changes required. h/o tracheostomy ARDS / ICU admission, Trach (2005) Microperforation of jejunem, ICU admission, 08/2008 Acute on Chronic renal failure, 08/2008 COPD exacerbation, mild respiratory failure, 08/2008 A. Fib with RVR, 08/2008  09/2008, ICU Admission, Intubated: Resp failure, PNA, A. Fib, RF 08/2008, Adenosine Myoview, EF 68% Echo, 07/2008, MR, EF 55%, LAE, mild LVH  06/2006 PFT's: LABORATORY DATA:  Spirometry was obtained today in the office, revealing severe obstructive defect, an FEV1 of 48% predicted, FVC of 61% predicted, FEV1:FVC ratio of 78%.  The patient ambulates around the office and drops to 88%, but no lower after 3 laps.   Past Pulmonary History:  Pulmonary History: Pulmonary Function Test  Date: 10/02/2008 Height (in.): 63 Gender: Female  Pre-Spirometry  FVC     Value: 1.85 L/min   Pred: 2.68 L/min     % Pred: 69 % FEV1     Value: 1.05 L     Pred: 1.86 L     % Pred: 57 % FEV1/FVC   Value: 57 %     Pred: 71 %     FEF 25-75   Value: 0.42 L/min   Pred: 2.11  L/min     % Pred: 20 %  Post-Spirometry  FVC     Value: 1.93 L/min   Pred: 2.68 L/min     % Pred: 72 % FEV1     Value: 1.09 L     Pred: 1.86 L     % Pred: 59 % FEV1/FVC   Value: 57 %     Pred: 71 %     FEF 25-75   Value: 0.42 L/min   Pred: 2.11 L/min     % Pred: 20 %  Lung Volumes  TLC     Value: 4.69 L   % Pred: 98 % RV     Value: 2.84 L   % Pred: 143 % DLCO     Value: 8.5 %   % Pred: 38 % DLCO/VA   Value: 3.49 %   % Pred: 103 %  Family  History: Reviewed history from 11/11/2008 and no changes required. No FH premature CAD emphysema: mother asthma: mother  Social History: Reviewed history from 10/07/2008 and no changes required. Married with children. Daughter, NP, is involved Tobacco Use - Former. quit 1970, started at age 31.  1 ppd pt is retired.  previously worked as a Production designer, theatre/television/film for [employer]. Regular Exercise - yes Drug Use - no Alcohol Use - no  Review of Systems       The patient complains of shortness of breath with activity.  The patient denies shortness of breath at rest, productive cough, non-productive cough, coughing up blood, chest pain, irregular heartbeats, acid heartburn, indigestion, loss of appetite, weight change, abdominal pain, difficulty swallowing, sore throat, tooth/dental problems, headaches, nasal congestion/difficulty breathing through nose, sneezing, itching, ear ache, anxiety, depression, hand/feet swelling, joint stiffness or pain, rash, change in color of mucus, and fever.    Vital Signs:  Patient profile:   77 year old female Height:      63 inches Weight:      211 pounds BMI:     37.51 O2 Sat:      94 % on Room air Temp:     98.1 degrees F oral Pulse rate:   59 / minute BP sitting:   124 / 70  (left arm) Cuff size:   regular  Vitals Entered By: Gweneth Dimitri RN (July 16, 2009 12:07 PM)  O2 Flow:  Room air CC: COPD follow up.  Pt states breathing is doing "good."  States she does wheeze occ.  Denies chest tightness and  cough.   Comments Medications reviewed with patient Daytime contact number verified with patient. Gweneth Dimitri RN  July 16, 2009 12:07 PM    Physical Exam  Additional Exam:  Gen: Pleasant, well-nourished, in no distress,  normal affect ENT: No lesions,  mouth clear,  oropharynx clear, no postnasal drip Neck: No JVD, no TMG, no carotid bruits Lungs: No use of accessory muscles, no dullness to percussion, distant BS   Cardiovascular: RRR, heart sounds normal, no murmur or gallops, no peripheral edema Abdomen: soft and NT, no HSM,  BS normal Musculoskeletal: No deformities, no cyanosis or clubbing Neuro: alert, non focal Skin: Warm, no lesions or rashes    Impression & Recommendations:  Problem # 1:  COPD (ICD-496) Assessment Unchanged  COPD  stable at this time with primary emphysematous component.  plan Maintain Spiriva and Advair for now  ONO revealed ongoing need for O2,  cont at bedtime oxygen   Complete Medication List: 1)  Warfarin Sodium 5 Mg Tabs (Warfarin sodium) .... Use as directed by anticoagulation clinic 2)  Spiriva Handihaler 18 Mcg Caps (Tiotropium bromide monohydrate) .... Once daily 3)  Multivitamins Tabs (Multiple vitamin) .... Once daily 4)  Metoprolol Succinate 25 Mg Xr24h-tab (Metoprolol succinate) .Marland Kitchen.. 1 tab once daily 5)  Furosemide 20 Mg Tabs (Furosemide) .... 4 tabs by mouth q am and 3 tabs by mouth q pm, and may add 1 tablet if swelling or weight gain 6)  Amlodipine Besylate 10 Mg Tabs (Amlodipine besylate) .... Take one tablet by mouth daily 7)  Lipitor 20 Mg Tabs (Atorvastatin calcium) .... Take one tablet by mouth daily. 8)  Amiodarone Hcl 200 Mg Tabs (Amiodarone hcl) .Marland Kitchen.. 1 tab daily 9)  Advair Diskus 250-50 Mcg/dose Misc (Fluticasone-salmeterol) .Marland Kitchen.. 1 puff two times a day 10)  Proair Hfa 108 (90 Base) Mcg/act Aers (Albuterol sulfate) .... 2 inh q4h as needed shortness  of breath 11)  Duoneb 0.5-2.5 (3) Mg/58ml Soln (Ipratropium-albuterol) ....  Use two times a day as needed shortness of breath 12)  Oxygen  .... 2l at bedtime and as needed during the day 13)  Hydralazine Hcl 25 Mg Tabs (Hydralazine hcl) .... Take one tablet by mouth three times a day 14)  Lovaza 1 Gm Caps (Omega-3-acid ethyl esters) .... Take 2 caps by with daily  Other Orders: TLB-BMP (Basic Metabolic Panel-BMET) (80048-METABOL) Est. Patient Level IV (40981) TLB-Hepatic/Liver Function Pnl (80076-HEPATIC) TLB-CK Total Only(Creatine Kinase/CPK) (82550-CK)  Patient Instructions: 1)  Stay on oxygen at night  2)  No change in inhalers  3)  Labs today 4)  Return 4months [place]   Appended Document: Pulmonary OV All labs obtained today stable

## 2010-05-13 NOTE — Medication Information (Signed)
Summary: CCR/AMD  Anticoagulant Therapy  Managed by: Charlena Cross, RN, BSN Referring MD: Golden Circle PCP: Hannah Beat MD Supervising MD: Bensimhon MD,Daniel Indication 1: Atrial fibrillation Lab Used: [facility] [place] Site: [place] INR POC 3.3 INR RANGE 2.0-3.0  Dietary changes: no    Health status changes: no    Bleeding/hemorrhagic complications: no    Recent/future hospitalizations: no    Any changes in medication regimen? no    Recent/future dental: no  Any missed doses?: no       Is patient compliant with meds? yes       Allergies: 1)  ! Sulfa  Anticoagulation Management History:      The patient is taking warfarin and comes in today for a routine follow up visit.  Positive risk factors for bleeding include an age of 75 years or older and presence of serious comorbidities.  The bleeding index is 'intermediate risk'.  Positive CHADS2 values include History of CHF, History of HTN, Age > 75 years old, and History of Diabetes.  Her last INR was 1.0.  Anticoagulation responsible provider: Bensimhon MD,Daniel.  INR POC: 3.3.    Anticoagulation Management Assessment/Plan:      The patient's current anticoagulation dose is Warfarin sodium 5 mg tabs: Use as directed by Anticoagulation Clinic.  The target INR is 2.0-3.0.  The next INR is due 08/10/2009.  Anticoagulation instructions were given to patient.  Results were reviewed/authorized by Charlena Cross, RN, BSN.  She was notified by Charlena Cross, RN, BSN.         Prior Anticoagulation Instructions: hold for 2 days then coumadin 5 mg daily with 2.5 mg on Mon Wed Fri  Current Anticoagulation Instructions: no coumadin today then coumadin 5 mg daily with 2.5 mg on M W F Sat

## 2010-05-13 NOTE — Letter (Signed)
Summary: [facility]  [facility]   Imported By: Lanelle Bal 02/26/2010 12:30:13  _____________________________________________________________________  External Attachment:    Type:   Image     Comment:   External Document

## 2010-05-13 NOTE — Medication Information (Signed)
Summary: CCR/AMD  Anticoagulant Therapy  Managed by: Charlena Cross, RN, BSN Referring MD: Golden Circle PCP: Hannah Beat MD Supervising MD: Mariah Milling Indication 1: Atrial fibrillation Lab Used: [facility] [place] Site: [place] INR POC 1.7 INR RANGE 2.0-3.0  Dietary changes: no    Health status changes: no    Bleeding/hemorrhagic complications: no    Recent/future hospitalizations: no    Any changes in medication regimen? no    Recent/future dental: no  Any missed doses?: no       Is patient compliant with meds? yes       Allergies: 1)  ! Sulfa  Anticoagulation Management History:      Positive risk factors for bleeding include an age of 75 years or older and presence of serious comorbidities.  The bleeding index is 'intermediate risk'.  Positive CHADS2 values include History of CHF, History of HTN, Age > 75 years old, and History of Diabetes.  Her last INR was 1.0.  Anticoagulation responsible provider: Niklaus Mamaril.  INR POC: 1.7.    Anticoagulation Management Assessment/Plan:      The patient's current anticoagulation dose is Warfarin sodium 5 mg tabs: Use as directed by Anticoagulation Clinic.  The target INR is 2.0-3.0.  The next INR is due 06/23/2009.  Results were reviewed/authorized by Charlena Cross, RN, BSN.  She was notified by Charlena Cross, RN, BSN.         Prior Anticoagulation Instructions: coumadin 7.5 mg today and tomorrow then coumadin 2.5 mg daily with 5 mg in Tues Thurs and Sat.  Current Anticoagulation Instructions: coumadin 7.5 mg today then 5 mg daily with 2.5 mg on Monday and Friday

## 2010-05-13 NOTE — Medication Information (Signed)
Summary: CCR/AMD  Anticoagulant Therapy  Managed by: Cloyde Reams, RN, BSN Referring MD: Golden Circle PCP: Hannah Beat MD Supervising MD: Mariah Milling Indication 1: Atrial fibrillation Lab Used: [facility] [place] Site: [place] INR POC 1.6 INR RANGE 2.0-3.0  Dietary changes: no    Health status changes: no    Bleeding/hemorrhagic complications: no    Recent/future hospitalizations: no    Any changes in medication regimen? no    Recent/future dental: no  Any missed doses?: yes     Details: Missed 1 dose yesterday.    Is patient compliant with meds? yes       Allergies: 1)  ! Sulfa  Anticoagulation Management History:      The patient is taking warfarin and comes in today for a routine follow up visit.  Positive risk factors for bleeding include an age of 75 years or older and presence of serious comorbidities.  The bleeding index is 'intermediate risk'.  Positive CHADS2 values include History of CHF, History of HTN, Age > 2 years old, and History of Diabetes.  Her last INR was 1.0.  Anticoagulation responsible provider: [place].  INR POC: 1.6.  Exp: 11/2010.    Anticoagulation Management Assessment/Plan:      The patient's current anticoagulation dose is Warfarin sodium 5 mg tabs: Use as directed by Anticoagulation Clinic.  The target INR is 2.0-3.0.  The next INR is due 10/13/2009.  Anticoagulation instructions were given to patient.  Results were reviewed/authorized by Cloyde Reams, RN, BSN.  She was notified by Cloyde Reams RN.         Prior Anticoagulation Instructions: INR 3.5  Skip tomorrow's dosage of coumadin, then start taking 1/2 tablet daily.  Recheck in 2 weeks.    Current Anticoagulation Instructions: INR 1.6  Start taking 1/2 tablet daily except 1 tablet on Mondays.  Recheck in 2 weeks.

## 2010-05-13 NOTE — Assessment & Plan Note (Signed)
Summary: DRAINAGE,CONGESTION/CLE   Vital Signs:  Patient profile:   77 year old female Height:      63 inches Weight:      216.50 pounds BMI:     38.49 Temp:     97.9 degrees F oral Pulse rate:   60 / minute Pulse rhythm:   regular BP sitting:   120 / 78  (left arm) Cuff size:   large  Vitals Entered By: Benny Lennert CMA Duncan Dull) (April 22, 2010 12:14 PM)  History of Present Illness: Chief complaint drainage and congestion  77 year old female:  Had some runny nose started to feel bad last friday.   very pleasant lady with COPD, multiple medical problems noted below, who comes in today after having had a runny nose it started a few days ago, but now it is actually improved. Today she feels generally per do well. She is not having any pulmonary manifestation, no cough and production of sputum.  She has not taken any medication, and now she feels improved.  His systems: No cough that is significantly productive, no wheezing. No fevers or chills.    Allergies: 1)  ! Sulfa  Past History:  Past medical, surgical, family and social histories (including risk factors) reviewed, and no changes noted (except as noted below).  Past Medical History: Reviewed history from 03/22/2010 and no changes required. 1. Type II diabetes 2. Obesity. 3. Moderate COPD.  PFTs (12/10): FVC 67%, FEV1 58%, ratio 60%, TLC 95%, RV 138%, DLCO 43% (moderate obstructive defect).  4. Diastolic congestive heart failure.  Most recent echo April 2010 showed mild LVH, EF 55-60%, mild left atrial enlargement, and mild mitral regurgitation. 5. Atrial fibrillation. 07/2008,  failed cardioversion with TEE guidance in May 2010.  Patient went back into NSR by 8/10 with amiodarone.  6. Chronic kidney disease, Cr 2.4 when last checked.  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  7. History of pneumonia with ARDS in 2005, MRSA PNA with prolonged hospitalization in [facility] in 11/11.    8. 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 Possible obstructive sleep apnea.  When the patient was sedated for TEE in May 2010, she did develop some upper airway obstruction. 10. Adenosine myoview (5/10):  Normal LV EF and no scar or ischemia (normal stress). 11. Anemia of chronic disease 12. Hypothyroidism 13. Gout   Primary = Dr. Patsy Lager Cardiology = Dr. Shirlee Latch Pulmonology = Dr. Delford Field Nephrology = Dr. Arrie Aran  Past Surgical History: Reviewed history from 03/15/2010 and no changes required. h/o tracheostomy ARDS / ICU admission, Trach (2005) Resp Failure, MRSA PNA (02/2010) Microperforation of jejunem, ICU admission, 08/2008 Acute on Chronic renal failure, 08/2008 COPD exacerbation, mild respiratory failure, 08/2008 A. Fib with RVR, 08/2008  09/2008, ICU Admission, Intubated: Resp failure, PNA, A. Fib, RF 08/2008, Adenosine Myoview, EF 68% Echo, 07/2008, MR, EF 55%, LAE, mild LVH  06/2006 PFT's: LABORATORY DATA:  Spirometry was obtained today in the office, revealing severe obstructive defect, an FEV1 of 48% predicted, FVC of 61% predicted, FEV1:FVC ratio of 78%.  The patient ambulates around the office and drops to 88%, but no lower after 3 laps.   Family History: Reviewed history from 11/11/2008 and no changes required. No FH premature CAD emphysema: mother asthma: mother  Social History: Reviewed history from 10/07/2008 and no changes required. Married with children. Daughter, NP, is involved Tobacco Use - Former. quit 1970, started at age 12.  1 ppd pt is retired.  previously worked as a  Production designer, theatre/television/film for [employer]. Regular Exercise - yes Drug Use - no Alcohol Use - no  Physical Exam  General:  Well-developed,well-nourished,in no acute distress; alert,appropriate and cooperative throughout examination. obese. Head:  Normocephalic and atraumatic without obvious abnormalities. Nose:  no external deformity.   Mouth:  Oral mucosa and oropharynx without  lesions or exudates.  Teeth in good repair. Lungs:  normal respiratory effort, no intercostal retractions, and no accessory muscle use.  decreased BS, no focal crackles and no wheezing Heart:  RRR, no m/g/r on my exam   Impression & Recommendations:  Problem # 1:  URI (ICD-465.9) resolving, ok to continue supportive care only.  albuterol as needed if needed.  Complete Medication List: 1)  Spiriva Handihaler 18 Mcg Caps (Tiotropium bromide monohydrate) .... Once daily 2)  Amlodipine Besylate 10 Mg Tabs (Amlodipine besylate) .... Take one tablet by mouth daily 3)  Amiodarone Hcl 200 Mg Tabs (Amiodarone hcl) .Marland Kitchen.. 1 tab daily 4)  Advair Diskus 250-50 Mcg/dose Misc (Fluticasone-salmeterol) .Marland Kitchen.. 1 puff two times a day 5)  Oxygen  .... 2l at bedtime and as needed during the day 6)  Magnesium Oxide 400 Mg Tabs (Magnesium oxide) .... Take 1 tablet by mouth three times a day 7)  Metoprolol Tartrate 25 Mg Tabs (Metoprolol tartrate) .... Take 1 tablet by mouth two times a day 8)  Ranitidine Hcl 150 Mg Tabs (Ranitidine hcl) .... Take 1 tablet by mouth once a day 9)  Xopenex 0.63 Mg/110ml Nebu (Levalbuterol hcl) .... One inhalation as needed. 10)  Coumadin 2 Mg Tabs (Warfarin sodium) .... As directed 11)  Furosemide 40 Mg Tabs (Furosemide) .... Take 1 tablet by mouth two times a day 12)  Coumadin 5 Mg Tabs (Warfarin sodium) .... As directed 13)  Coumadin 6 Mg Tabs (Warfarin sodium) .... As directed 14)  Ventolin Hfa 108 (90 Base) Mcg/act Aers (Albuterol sulfate) .Marland Kitchen.. 1-2 puffs every 6 hours as needed 15)  Synthroid 25 Mcg Tabs (Levothyroxine sodium) .... One tablet once daily  Other Orders: Protime ([id number])   Orders Added: 1)  Protime [[id number]] 2)  Est. Patient Level III [65784]    Current Allergies (reviewed today): ! SULFA  Laboratory Results   Blood Tests   Date/Time Recieved: April 22, 2010 12:36 PM  Date/Time Reported: April 22, 2010 12:36 PM   PT: 20.9 s   (Normal Range:  10.6-13.4)  INR: 1.7   (Normal Range: 0.88-1.12   Therap INR: 2.0-3.5)      ANTICOAGULATION RECORD PREVIOUS REGIMEN & LAB RESULTS Anticoagulation Diagnosis:  Atrial fibrillation on  09/22/2008 Previous INR Goal Range:  2.0-3.0 on  10/16/2008 Previous INR:  1.63 on  03/23/2010 Previous Coumadin Dose(mg):  5mg  on  12/30/2009 Previous Regimen:  2.5mg  qd, 5mg  Thur,Sun on  12/11/2008 Previous Coagulation Comments:  . on  11/21/2008  NEW REGIMEN & LAB RESULTS Current INR: 1.7 Regimen: 2.5mg  qd, 5mg  T,TH,SAT  Provider: Jayline Kilburg      Repeat testing in: 2 weeks MEDICATIONS SPIRIVA HANDIHALER 18 MCG CAPS (TIOTROPIUM BROMIDE MONOHYDRATE) once daily AMLODIPINE BESYLATE 10 MG TABS (AMLODIPINE BESYLATE) Take one tablet by mouth daily AMIODARONE HCL 200 MG TABS (AMIODARONE HCL) 1 tab daily ADVAIR DISKUS 250-50 MCG/DOSE MISC (FLUTICASONE-SALMETEROL) 1 puff two times a day * OXYGEN 2L at bedtime and as needed during the day MAGNESIUM OXIDE 400 MG TABS (MAGNESIUM OXIDE) Take 1 tablet by mouth three times a day METOPROLOL TARTRATE 25 MG TABS (METOPROLOL TARTRATE) Take 1 tablet by mouth two times a day  RANITIDINE HCL 150 MG TABS (RANITIDINE HCL) Take 1 tablet by mouth once a day XOPENEX 0.63 MG/3ML NEBU (LEVALBUTEROL HCL) One inhalation as needed. COUMADIN 2 MG TABS (WARFARIN SODIUM) as directed FUROSEMIDE 40 MG TABS (FUROSEMIDE) Take 1 tablet by mouth two times a day COUMADIN 5 MG TABS (WARFARIN SODIUM) as directed COUMADIN 6 MG TABS (WARFARIN SODIUM) as directed VENTOLIN HFA 108 (90 BASE) MCG/ACT AERS (ALBUTEROL SULFATE) 1-2 puffs every 6 hours as needed SYNTHROID 25 MCG TABS (LEVOTHYROXINE SODIUM) one tablet once daily  Dose has been reviewed with patient or caretaker during this visit.  Reviewed by: Allison Quarry  Anticoagulation Visit Questionnaire      Coumadin dose missed/changed:  No      Abnormal Bleeding Symptoms:  No Any diet changes including alcohol intake, vegetables or greens since  the last visit:  No Any illnesses or hospitalizations since the last visit:  No Any signs of clotting since the last visit (including chest discomfort, dizziness, shortness of breath, arm tingling, slurred speech, swelling or redness in leg):  No

## 2010-05-13 NOTE — Medication Information (Signed)
Summary: ccr  Anticoagulant Therapy  Managed by: Charlena Cross, RN, BSN Referring MD: Golden Circle PCP: Hannah Beat MD Supervising MD: Graciela Husbands MD, Viviann Spare Indication 1: Atrial fibrillation Lab Used: [facility] [place] Site: [place] INR POC 5.1 INR RANGE 2.0-3.0  Dietary changes: no    Health status changes: no    Bleeding/hemorrhagic complications: no    Recent/future hospitalizations: no    Any changes in medication regimen? no    Recent/future dental: no  Any missed doses?: no       Is patient compliant with meds? yes       Allergies: 1)  ! Sulfa  Anticoagulation Management History:      The patient is taking warfarin and comes in today for a routine follow up visit.  Positive risk factors for bleeding include an age of 75 years or older and presence of serious comorbidities.  The bleeding index is 'intermediate risk'.  Positive CHADS2 values include History of CHF, History of HTN, Age > 70 years old, and History of Diabetes.  Her last INR was 1.0.  Anticoagulation responsible provider: Graciela Husbands MD, Viviann Spare.  INR POC: 5.1.    Anticoagulation Management Assessment/Plan:      The patient's current anticoagulation dose is Warfarin sodium 5 mg tabs: Use as directed by Anticoagulation Clinic.  The target INR is 2.0-3.0.  The next INR is due 07/27/2009.  Anticoagulation instructions were given to patient.  Results were reviewed/authorized by Charlena Cross, RN, BSN.  She was notified by Charlena Cross, RN, BSN.         Prior Anticoagulation Instructions: coumadin 7.5 mg today then 5 mg daily with 2.5 mg on Monday and Friday  Current Anticoagulation Instructions: hold for 2 days then coumadin 5 mg daily with 2.5 mg on Mon Wed Fri

## 2010-05-13 NOTE — Medication Information (Signed)
Summary: rov/ewj  Anticoagulant Therapy  Managed by: Cloyde Reams, RN, BSN Referring MD: [facility] Circle PCP: Hannah Beat MD Supervising MD: Mariah Milling Indication 1: Atrial fibrillation Lab Used: [facility] [place] Site: [place] INR POC 3.2 INR RANGE 2.0-3.0    Bleeding/hemorrhagic complications: no     Any changes in medication regimen? no     Any missed doses?: no         Allergies: 1)  ! Sulfa  Anticoagulation Management History:      The patient is taking warfarin and comes in today for a routine follow up visit.  Positive risk factors for bleeding include an age of 75 years or older and presence of serious comorbidities.  The bleeding index is 'intermediate risk'.  Positive CHADS2 values include History of CHF, History of HTN, Age > 75 years old, and History of Diabetes.  Her last INR was 1.0.  Anticoagulation responsible provider: Lucill Mauck.  INR POC: 3.2.  Cuvette Lot#: [id number].  Exp: 08/2010.    Anticoagulation Management Assessment/Plan:      The patient's current anticoagulation dose is Warfarin sodium 5 mg tabs: Use as directed by Anticoagulation Clinic.  The target INR is 2.0-3.0.  The next INR is due 09/01/2009.  Anticoagulation instructions were given to patient.  Results were reviewed/authorized by Cloyde Reams, RN, BSN.  She was notified by Cloyde Reams RN.         Prior Anticoagulation Instructions: INR 5.2  Skip 2 days of coumadin, then decrease dosage to 1/2 tablet daily except 1 tablet on Thursdays and Sundays.  Recheck in 1 week.    Current Anticoagulation Instructions: INR 3.2  Skip today's dosage of coumadin, then start taking 1/2 tablet daily except 1 tablet on Thursdays.  Recheck in 2 weeks.

## 2010-05-13 NOTE — Medication Information (Signed)
Summary: Coumadin Clinic  Anticoagulant Therapy  Managed by: Cloyde Reams, RN, BSN Referring MD: [facility] Circle PCP: Hannah Beat MD Supervising MD: Mariah Milling Indication 1: Atrial fibrillation Lab Used: [facility] [place] Site: [place] INR POC 2.9 INR RANGE 2.0-3.0  Dietary changes: no    Health status changes: no    Bleeding/hemorrhagic complications: no    Recent/future hospitalizations: no    Any changes in medication regimen? no    Recent/future dental: no  Any missed doses?: no       Is patient compliant with meds? yes       Allergies: 1)  ! Sulfa  Anticoagulation Management History:      The patient is taking warfarin and comes in today for a routine follow up visit.  Positive risk factors for bleeding include an age of 80 years or older and presence of serious comorbidities.  The bleeding index is 'intermediate risk'.  Positive CHADS2 values include History of CHF, History of HTN, Age > 48 years old, and History of Diabetes.  Her last INR was 1.0.  Anticoagulation responsible provider: [place].  INR POC: 2.9.  Cuvette Lot#: [id number].  Exp: 01/2011.    Anticoagulation Management Assessment/Plan:      The patient's current anticoagulation dose is Warfarin sodium 5 mg tabs: Use as directed by Anticoagulation Clinic.  The target INR is 2.0-3.0.  The next INR is due 12/30/2009.  Anticoagulation instructions were given to patient.  Results were reviewed/authorized by Cloyde Reams, RN, BSN.  She was notified by Cloyde Reams RN.         Prior Anticoagulation Instructions: INR 2.4  Continue taking 1/2 tab daily except for 1 tab on Mondays. Recheck in 3 weeks.   Current Anticoagulation Instructions: INR 2.9  Continue on same dosage 1/2 tablet daily except 1 tablet on Mondays.  Recheck in 4 weeks.

## 2010-05-13 NOTE — Assessment & Plan Note (Signed)
Summary: discuss meds   Visit Type:  Follow-up Referring Provider:  Karleen Hampshire Copland/ Marca Ancona Primary Provider:  Hannah Beat MD  CC:  discuss medications.  History of Present Illness: 77 yo with history of COPD, atrial fibrillation, diastolic CHF, and CKD presents for followup.  She is in sinus rhythm on amiodarone.  Patient had a recent prolonged hospitalization (10/11-11/11) at [facility].  She initially entered the hospital with diarrhea and acute on chronic renal failure.  She developed MRSA PNA and was treated for diastolic CHF exacerbation and COPD exacerbation.  She is still on 10 mg daily of prednisone.  She feels like she is steadily improving since leaving the hospital.  She is using a walker to get around and oxygen at night.  She is able to walk around her house without dyspnea.  She has significant lower extremity edema but this has been slowly improving.  She is getting PT and OT at home. She is off Lipitor, Tricor, and hydralazine.  BP is good today.    Labs (8/10): K 4.2, creatinine 2.8 Labs (11/10): creatinine 2.39, TGs 558, HDL 23 Labs (4/11): creatinine 2.05, K 4.4, LFTs normal, TGs 406, HDL 23 Labs (12/11): creatinine 2.5, K 3.9, LFTs normal, HCT 31.3  ECG: NSR with 1st degree AV block, LAFB, poor anterior R wave progression   Current Medications (verified): 1)  Spiriva Handihaler 18 Mcg Caps (Tiotropium Bromide Monohydrate) .... Once Daily 2)  Multivitamins   Tabs (Multiple Vitamin) .... Once Daily 3)  Amlodipine Besylate 10 Mg Tabs (Amlodipine Besylate) .... Take One Tablet By Mouth Daily 4)  Amiodarone Hcl 200 Mg Tabs (Amiodarone Hcl) .Marland Kitchen.. 1 Tab Daily 5)  Advair Diskus 250-50 Mcg/dose Misc (Fluticasone-Salmeterol) .Marland Kitchen.. 1 Puff Two Times A Day 6)  Oxygen .... 2l At Bedtime and As Needed During The Day 7)  Hydralazine Hcl 25 Mg Tabs (Hydralazine Hcl) .... Take One Tablet By Mouth Three Times A Day 8)  Magnesium Oxide 400 Mg Tabs (Magnesium Oxide) ....  Take 1 Tablet By Mouth Three Times A Day 9)  Metoprolol Tartrate 25 Mg Tabs (Metoprolol Tartrate) .... Take 1 Tablet By Mouth Two Times A Day 10)  Prednisone 10 Mg Tabs (Prednisone) .... Take 1 Tablet By Mouth Once A Day 11)  Ranitidine Hcl 150 Mg Tabs (Ranitidine Hcl) .... Take 1 Tablet By Mouth Once A Day 12)  Xopenex 0.63 Mg/72ml Nebu (Levalbuterol Hcl) .... One Inhalation As Needed. 13)  Coumadin 2 Mg Tabs (Warfarin Sodium) .... Take 1 Tablet By Mouth Once A Day 14)  Furosemide 40 Mg Tabs (Furosemide) .... Take 1 Tablet By Mouth Two Times A Day 15)  Coumadin 5 Mg Tabs (Warfarin Sodium) .... As Directed 16)  Coumadin 6 Mg Tabs (Warfarin Sodium) .... As Directed 17)  Tricor 48 Mg Tabs (Fenofibrate) .... One Tablet By Mouth Daily  Allergies (verified): 1)  ! Sulfa  Past History:  Past Surgical History: Last updated: 03/15/2010 h/o tracheostomy ARDS / ICU admission, Trach (2005) Resp Failure, MRSA PNA (02/2010) Microperforation of jejunem, ICU admission, 08/2008 Acute on Chronic renal failure, 08/2008 COPD exacerbation, mild respiratory failure, 08/2008 A. Fib with RVR, 08/2008  09/2008, ICU Admission, Intubated: Resp failure, PNA, A. Fib, RF 08/2008, Adenosine Myoview, EF 68% Echo, 07/2008, MR, EF 55%, LAE, mild LVH  06/2006 PFT's: LABORATORY DATA:  Spirometry was obtained today in the office, revealing severe obstructive defect, an FEV1 of 48% predicted, FVC of 61% predicted, FEV1:FVC ratio of 78%.  The patient ambulates around  the office and drops to 88%, but no lower after 3 laps.   Family History: Last updated: 11/11/2008 No FH premature CAD emphysema: mother asthma: mother  Social History: Last updated: 10/07/2008 Married with children. Daughter, NP, is involved Tobacco Use - Former. quit 1970, started at age 66.  1 ppd pt is retired.  previously worked as a Production designer, theatre/television/film for [employer]. Regular Exercise - yes Drug Use - no Alcohol Use - no  Risk  Factors: Exercise: yes (08/20/2008)  Risk Factors: Smoking Status: quit > 6 months (07/16/2009) Packs/Day: 1.0 (10/07/2008)  Past Medical History: 1. Type II diabetes 2. Obesity. 3. Moderate COPD.  PFTs (12/10): FVC 67%, FEV1 58%, ratio 60%, TLC 95%, RV 138%, DLCO 43% (moderate obstructive defect).  4. Diastolic congestive heart failure.  Most recent echo April 2010 showed mild LVH, EF 55-60%, mild left atrial enlargement, and mild mitral regurgitation. 5. Atrial fibrillation. 07/2008,  failed cardioversion with TEE guidance in May 2010.  Patient went back into NSR by 8/10 with amiodarone.  6. Chronic kidney disease, Cr 2.4 when last checked.  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  7. History of pneumonia with ARDS in 2005, MRSA PNA with prolonged hospitalization in [facility] in 11/11.  8. 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 Possible obstructive sleep apnea.  When the patient was sedated for TEE in May 2010, she did develop some upper airway obstruction. 10. Adenosine myoview (5/10):  Normal LV EF and no scar or ischemia (normal stress). 11. Anemia of chronic disease 12. Hypothyroidism 13. Gout   Primary = Dr. Patsy Lager Cardiology = Dr. Shirlee Latch Pulmonology = Dr. Delford Field Nephrology = Dr. Arrie Aran  Family History: Reviewed history from 11/11/2008 and no changes required. No FH premature CAD emphysema: mother asthma: mother  Social History: Reviewed history from 10/07/2008 and no changes required. Married with children. Daughter, NP, is involved Tobacco Use - Former. quit 1970, started at age 60.  1 ppd pt is retired.  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   Vital Signs:  Patient profile:   77 year old female Height:      63 inches Weight:      203 pounds Pulse rate:   57 /  minute BP sitting:   128 / 78  (left arm) Cuff size:   large  Vitals Entered By: Lysbeth Galas CMA (March 22, 2010 2:13 PM)  Physical Exam  General:  Well developed, well nourished, in no acute distress.  Obese.  Neck:  Neck supple, no JVD. No masses, thyromegaly or abnormal cervical nodes. Lungs:  Crackles at bases bilaterally.  Heart:  Non-displaced PMI, chest non-tender; regular rate and rhythm, S1, S2 without murmurs, rubs or gallops. Carotid upstroke normal, no bruit. 2+ edema 1/2 up bilaterally.  Abdomen:  Bowel sounds positive; abdomen soft and non-tender without masses, organomegaly, or hernias noted. No hepatosplenomegaly. Extremities:  No clubbing or cyanosis. Neurologic:  Alert and oriented x 3. Psych:  Normal affect.   Impression & Recommendations:  Problem # 1:  DIASTOLIC HEART FAILURE, CHRONIC (ICD-428.32) No JVD but significant peripheral edema.  She is not significantly short of breath with exertion but is not very active.  I will continue her on Lasix 40 mg daily.  She feels like her lower extremity edema edema is gradually  improving.    Problem # 2:  HYPERTENSION, UNSPECIFIED (ICD-401.9) Excellent BP.  She is not taking hydralazine and does not need to start back on it.   Problem # 3:  HYPOTHYROIDISM (ICD-244.9) Had low free T4 and high TSH in the past and saw Dr. Patsy Lager.  She is not taking thyroid replacement at this time.  Will repeat thyroid indices.  If low, needs treatment.   Problem # 4:  CKD Creatinine not far from baseline.  Has pauci-immune glomerulonephritis followed by Dr. Arrie Aran.   Problem # 5:  ATRIAL FIBRILLATION (ICD-427.31) Remains in NSR.  Very symptomatic when in atrial fibrillation.  Continue metoprolol, amiodarone 200 mg daily, and warfarin.  LFTs have been ok, has hypothyroidism that may need treatment.  Following up on Thursday with pulmonology.  Needs yearly eye exams.  Will continue amiodarone for now as think lung issues are due to  COPD and OSA unless pulmonary thinks otherwise.   Problem # 6:  HYPERLIPIDEMIA-MIXED (ICD-272.4) Would resume Lipitor and Tricor.  Has had very high triglycerides in the past (up to pancreatitis range).  Restart meds and get lipids/LFTs in 2 months.    Other Orders: T-T4, Free [phone]) T-T3, Free [phone]) T-TSH [phone])  Patient Instructions: 1)  Your physician recommends that you schedule a follow-up appointment in: 2 months 2)  Your physician recommends that you return for a FASTING lipid profile: 2 months (LFT/Lipid) 3)  Your physician has recommended you make the following change in your medication: Stop Hydralazine. Restart Lipitor 20mg  once daily. Restart Tricor 48mg  once daily. Prescriptions: LIPITOR 20 MG TABS (ATORVASTATIN CALCIUM) Take one tablet by mouth daily.  #30 x 6   Entered by:   Lanny Hurst RN   Authorized by:   Marca Ancona, MD   Signed by:   Lanny Hurst RN on 03/22/2010   Method used:   Electronically to        [facility] (retail)       [address]       [place], [place]  [address]       Ph: [phone]       Fax: [phone]   RxID:   [id number]

## 2010-05-13 NOTE — Letter (Signed)
Summary: [facility]  [facility]   Imported By: Lanelle Bal 02/26/2010 12:36:59  _____________________________________________________________________  External Attachment:    Type:   Image     Comment:   External Document

## 2010-05-13 NOTE — Miscellaneous (Signed)
Summary: Coumadin Orders/[facility]  Coumadin Orders/[facility]   Imported By: Lanelle Bal 03/31/2010 07:58:05  _____________________________________________________________________  External Attachment:    Type:   Image     Comment:   External Document

## 2010-05-13 NOTE — Progress Notes (Signed)
Summary: Lab results  Phone Note Outgoing Call   Reason for Call: Discuss lab or test results Summary of Call: call the pt and tell her all labs were ok, kidney function is ok send copy of her labs to her pcp, dr Anastasio Auerbach of [facility] Initial call taken by: Storm Frisk MD,  July 17, 2009 11:53 AM  Follow-up for Phone Call        Called, spoke with pt.  Pt informed labs were ok and kidney function ok  per PW.  Pt also informed copy of labs will be sent to Dr. Geanie Berlin.  Gweneth Dimitri RN  July 17, 2009 1:43 PM  Copy of labs sent to Dr. Nolene Ebbs via [facility]. Gweneth Dimitri RN  July 17, 2009 1:45 PM

## 2010-05-13 NOTE — Assessment & Plan Note (Signed)
Summary: FOLLOW UP FROM REHAB   Vital Signs:  Patient profile:   77 year old female Height:      63 inches Weight:      198.7 pounds BMI:     35.33 Temp:     98.1 degrees F oral Pulse rate:   64 / minute Pulse rhythm:   regular BP sitting:   120 / 70  (left arm) Cuff size:   large  Vitals Entered By: Benny Lennert CMA Duncan Dull) (March 15, 2010 11:52 AM)  Primary Provider/Referring Provider:  Hannah Beat MD   History of Present Illness: Chief complaint follow up rehab  77 year old female with a history of COPD, history of antral fibrillation with a history of RVR with multiple history of respiratory failure advance, also a history of chronic renal failure followed by Dr. Rayvon Char, most recent prior creatinine was 2.4.  Questionable history of new onset type 2 diabetes during severe hospitalization at the regional.  Patient was hospitalized from January 24, 2010 through February 10, 2010.  Primary diagnosis and admission was 4 a MRSA pneumonia and subsequent respiratory failure. Additionally, she had renal failure, and a supratherapeutic INR. Acute COPD exacerbation secondary to her MRSA and pneumonia as well.  Discharge creatinine was 2.5.  Also notable that the CBC at discharge had a hemoglobin of 8 and a hematocrit of 27.  Bacterial sputum from January 29, 2010 grew MRSA.  Blood cultures were negative x2. Pneumococcal and Legionella antigens were negative  Discharge medication list is different from our list and different from the medications that I had had the patient on as well as pulmonary and cardiology.  Allergies: 1)  ! Sulfa  Past History:  Past medical, surgical, family and social histories (including risk factors) reviewed, and no changes noted (except as noted below).  Past Medical History: 1. Prediabetes.  2. Obesity. 3. Moderate COPD.  PFTs (12/10): FVC 67%, FEV1 58%, ratio 60%, TLC 95%, RV 138%, DLCO 43% (moderate obstructive defect).  4.  Diastolic congestive heart failure.  Most recent echo April 2010 showed mild LVH, EF 55-60%, mild left atrial enlargement, and mild mitral regurgitation. 5. Atrial fibrillation. 07/2008,  failed cardioversion with TEE guidance in May 2010.  Patient went back into NSR by 8/10 with amiodarone.  6. Chronic kidney disease, Cr 2.4 when last checked.  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  7. History of pneumonia with ARDS in 2005.  Respiratory Failure, MRSA PNA [facility], 02/2010) 8. 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 Possible obstructive sleep apnea.  When the patient was sedated for TEE in May 2010, she did develop some upper airway obstruction. 10.Adenosine myoview (5/10):  Normal LV EF and no scar or ischemia (normal stress). 11. Anemia of chronic disease 12. Hypothyroidism 13. Gout   Primary = Dr. Patsy Lager Cardiology = Dr. Shirlee Latch Pulmonology = Dr. Delford Field Nephrology = Dr. Arrie Aran  Past Surgical History: h/o tracheostomy ARDS / ICU admission, Trach (2005) Resp Failure, MRSA PNA (02/2010) Microperforation of jejunem, ICU admission, 08/2008 Acute on Chronic renal failure, 08/2008 COPD exacerbation, mild respiratory failure, 08/2008 A. Fib with RVR, 08/2008  09/2008, ICU Admission, Intubated: Resp failure, PNA, A. Fib, RF 08/2008, Adenosine Myoview, EF 68% Echo, 07/2008, MR, EF 55%, LAE, mild LVH  06/2006 PFT's: LABORATORY DATA:  Spirometry was obtained today in the office, revealing severe obstructive defect, an FEV1 of 48% predicted, FVC of 61% predicted, FEV1:FVC ratio of 78%.  The patient ambulates around the office and drops to 88%,  but no lower after 3 laps.   Past Pulmonary History:  Pulmonary History: Pulmonary Function Test  Date: 10/02/2008 Height (in.): 63 Gender: Female  Pre-Spirometry  FVC     Value: 1.85 L/min   Pred: 2.68 L/min     % Pred: 69 % FEV1     Value: 1.05 L     Pred: 1.86 L     % Pred: 57 % FEV1/FVC   Value: 57 %      Pred: 71 %     FEF 25-75   Value: 0.42 L/min   Pred: 2.11 L/min     % Pred: 20 %  Post-Spirometry  FVC     Value: 1.93 L/min   Pred: 2.68 L/min     % Pred: 72 % FEV1     Value: 1.09 L     Pred: 1.86 L     % Pred: 59 % FEV1/FVC   Value: 57 %     Pred: 71 %     FEF 25-75   Value: 0.42 L/min   Pred: 2.11 L/min     % Pred: 20 %  Lung Volumes  TLC     Value: 4.69 L   % Pred: 98 % RV     Value: 2.84 L   % Pred: 143 % DLCO     Value: 8.5 %   % Pred: 38 % DLCO/VA   Value: 3.49 %   % Pred: 103 %  Family History: Reviewed history from 11/11/2008 and no changes required. No FH premature CAD emphysema: mother asthma: mother  Social History: Reviewed history from 10/07/2008 and no changes required. Married with children. Daughter, NP, is involved Tobacco Use - Former. quit 1970, started at age 60.  1 ppd pt is retired.  previously worked as a Production designer, theatre/television/film for [employer]. Regular Exercise - yes Drug Use - no Alcohol Use - no  Review of Systems       walking with a walker without difficulty, she can easily walk with a walker from my examination room to the parking lot without any significant problems. She is getting around the house well. Sure strength is greatly improved. She did not actively having any chest pain. She is not producing any sputum. She is having some occasional shortness of breath.  Physical Exam  General:  Well-developed,well-nourished,in no acute distress; alert,appropriate and cooperative throughout examination. obese. Head:  Normocephalic and atraumatic without obvious abnormalities. Nose:  no external deformity.   Lungs:  normal respiratory effort, no intercostal retractions, and no accessory muscle use.  decreased BS, no focal crackles and no wheezing Heart:  RRR, no m/g/r on my exam Psych:  Cognition and judgment appear intact. Alert and cooperative with normal attention span and concentration. No apparent delusions, illusions, hallucinations   Impression &  Recommendations:  Problem # 1:  RESPIRATORY FAILURE, ACUTE (ICD-518.81) complex history, the patient was on  Zosyn and ciprofloxacin. The patient was receiving 60% O2 on a partial nonrebreather.  She also was diuresed and placed on doses of IV Lasix, high dose steroids, weaned to 4L o2, now on 2L O2  On Prednisone 10 mg a day in this complex COPD case with a history of respiratory failure, think it is most appropriate to have the patient follow up with her pulmonologist to get his input and help with her long term care to prevent recurrence of hospitalization. I would ideally like to get the patient off of steroids. I appreciate Dr. Lynelle Doctor help.  >  45 minutes spent in total face to face time with the patient with >50% of time spent in counselling and coordination of care: I spent much of my time purely a counseling with the patient, and in face to face time, reviewed all of her medications. I not clear that the patient has true diabetes type 2, and I did check laboratories. Think much of this may be steroid induced, and would not assume the patient does have diabetes until she is off steroids orally. I reviewed all of her medications and they were multiple discrepancies based on the medication list as well as the outpatient discharge list. At this point, I simplified list and correlated to what I think she should be on, but I want specialist help here for long term optimization. Orders: Pulmonary Referral (Pulmonary)  Problem # 2:  RENAL FAILURE, ACUTE (ICD-584.9) up to 2.5 during her hospitalization, not greatly increased compared to her prior office visit is in  Orders: Venipuncture (22025) TLB-BMP (Basic Metabolic Panel-BMET) (80048-METABOL)  Problem # 3:  DIASTOLIC HEART FAILURE, CHRONIC (ICD-428.32) the physicians at term [facility] discontinued the patient's Crestor, and additionally discontinued her calcium channel blocker, diltiazem.  Patient does have chronic diastolic heart failure.  She is on amiodarone, and Toprol.  Physical .cardiological case, in the setting of acute respiratory failure and long-term hospitalization. I think the cardiological input in terms of her long-term care in helping me sort out this medication management is needed. I have been asked Dr. Joanne Gavel opinion about these issues.  My proper area question is: Does the patient need to go back on her rate controlling calcium channel blocker, and I believe that she does need to be on a statin.  Her updated medication list for this problem includes:    Metoprolol Tartrate 25 Mg Tabs (Metoprolol tartrate) .Marland Kitchen... Take 1 tablet by mouth two times a day    Coumadin 2 Mg Tabs (Warfarin sodium) .Marland Kitchen... Take 1 tablet by mouth once a day    Furosemide 40 Mg Tabs (Furosemide) .Marland Kitchen... Take 1 tablet by mouth two times a day    Coumadin 5 Mg Tabs (Warfarin sodium) .Marland Kitchen... As directed    Coumadin 6 Mg Tabs (Warfarin sodium) .Marland Kitchen... As directed  Problem # 4:  ATRIAL FIBRILLATION (ICD-427.31)  The following medications were removed from the medication list:    Diltiazem Hcl Cr 240 Mg Xr24h-cap (Diltiazem hcl) .Marland Kitchen... Take 1 capsule by mouth once a day Her updated medication list for this problem includes:    Amlodipine Besylate 10 Mg Tabs (Amlodipine besylate) .Marland Kitchen... Take one tablet by mouth daily    Amiodarone Hcl 200 Mg Tabs (Amiodarone hcl) .Marland Kitchen... 1 tab daily    Metoprolol Tartrate 25 Mg Tabs (Metoprolol tartrate) .Marland Kitchen... Take 1 tablet by mouth two times a day    Coumadin 2 Mg Tabs (Warfarin sodium) .Marland Kitchen... Take 1 tablet by mouth once a day    Coumadin 5 Mg Tabs (Warfarin sodium) .Marland Kitchen... As directed    Coumadin 6 Mg Tabs (Warfarin sodium) .Marland Kitchen... As directed  Orders: Cardiology Referral (Cardiology)  Problem # 5:  COPD (ICD-496) additionally, she is on oxygen, she is wearing this at nighttime. She is also occasionally wearing his p.r.n. during the day.  Currently she is also on prednisone 10 mg daily  Her updated medication list  for this problem includes:    Spiriva Handihaler 18 Mcg Caps (Tiotropium bromide monohydrate) ..... Once daily    Advair Diskus 250-50 Mcg/dose Misc (Fluticasone-salmeterol) .Marland Kitchen... 1 puff two  times a day    Xopenex 0.63 Mg/40ml Nebu (Levalbuterol hcl) ..... One inhalation as needed.  Complete Medication List: 1)  Spiriva Handihaler 18 Mcg Caps (Tiotropium bromide monohydrate) .... Once daily 2)  Multivitamins Tabs (Multiple vitamin) .... Once daily 3)  Amlodipine Besylate 10 Mg Tabs (Amlodipine besylate) .... Take one tablet by mouth daily 4)  Amiodarone Hcl 200 Mg Tabs (Amiodarone hcl) .Marland Kitchen.. 1 tab daily 5)  Advair Diskus 250-50 Mcg/dose Misc (Fluticasone-salmeterol) .Marland Kitchen.. 1 puff two times a day 6)  Oxygen  .... 2l at bedtime and as needed during the day 7)  Hydralazine Hcl 25 Mg Tabs (Hydralazine hcl) .... Take one tablet by mouth three times a day 8)  Magnesium Oxide 400 Mg Tabs (Magnesium oxide) .... Take 1 tablet by mouth three times a day 9)  Metoprolol Tartrate 25 Mg Tabs (Metoprolol tartrate) .... Take 1 tablet by mouth two times a day 10)  Prednisone 10 Mg Tabs (Prednisone) .... Take 1 tablet by mouth once a day 11)  Ranitidine Hcl 150 Mg Tabs (Ranitidine hcl) .... Take 1 tablet by mouth once a day 12)  Xopenex 0.63 Mg/54ml Nebu (Levalbuterol hcl) .... One inhalation as needed. 13)  Coumadin 2 Mg Tabs (Warfarin sodium) .... Take 1 tablet by mouth once a day 14)  Furosemide 40 Mg Tabs (Furosemide) .... Take 1 tablet by mouth two times a day 15)  Coumadin 5 Mg Tabs (Warfarin sodium) .... As directed 16)  Coumadin 6 Mg Tabs (Warfarin sodium) .... As directed  Other Orders: TLB-CBC Platelet - w/Differential (85025-CBCD) TLB-Hepatic/Liver Function Pnl (80076-HEPATIC)  Patient Instructions: 1)  FOLLOW-UP WITH DR. Delford Field AND DR. Shirlee Latch. I HAVE SOME SPECIFIC QUESTIONS FOR THEM AND THEY WILL WANT TO SEE YOU. 2)  Referral Appointment Information 3)  Day/Date: 4)  Time: 5)  Place/MD: 6)   Address: 7)  Phone/Fax: 8)  Patient given appointment information. Information/Orders faxed/mailed.  9)  recheck our office 3 months   Orders Added: 1)  Venipuncture [36415] 2)  TLB-BMP (Basic Metabolic Panel-BMET) [80048-METABOL] 3)  TLB-CBC Platelet - w/Differential [85025-CBCD] 4)  TLB-Hepatic/Liver Function Pnl [80076-HEPATIC] 5)  Cardiology Referral [Cardiology] 6)  Pulmonary Referral [Pulmonary] 7)  Est. Patient Level V [09811]    Current Allergies (reviewed today): ! SULFA

## 2010-05-13 NOTE — Letter (Signed)
Summary: [facility]  [facility]N/[facility]   Imported By: Lester Kingsland 09/29/2009 07:39:20  _____________________________________________________________________  External Attachment:    Type:   Image     Comment:   External Document

## 2010-05-13 NOTE — Letter (Signed)
Summary: [facility] / [facility]  [facility] / [facility]   Imported By: Lennie Odor 10/26/2009 16:02:16  _____________________________________________________________________  External Attachment:    Type:   Image     Comment:   External Document

## 2010-05-13 NOTE — Progress Notes (Signed)
Summary: regarding insulin  Phone Note From Other Clinic   Caller: Lyla Son with [facility]  [phone] Summary of Call: Pt was discharged from nursing facility after rehab.  She was discharged with instructions to take 4 units of novulin at each meal, lantus 20 units at bedtime.  She is to check her sugars 4 times a day.  She is taking the lantus but not the novulin.  She does not have a meter to check her sugars.  She is concerned about having to take the insulin.  She says she is not really diabetic, sugars just go up when she takes prednisone.  Should she continue the lantus until her appt with Dr. Patsy Lager on 12/08? Initial call taken by: Lowella Petties CMA, AAMA,  March 03, 2010 12:47 PM  Follow-up for Phone Call        new dx diabetes recent hospitalization, unclear if truly diabetic or just steroid induced hyperglycemia although A1c 08/2008 7.9%.  If has been taking lantus, should be ok to continue.  However as I don't know patient, may be safest thing to stop insulin until seen by PCP.  can we have sooner appt with [facility]?  will route to PCP. Follow-up by: Eustaquio Boyden  MD,  March 03, 2010 1:12 PM  Additional Follow-up for Phone Call Additional follow up Details #1::        Advised pt, she will stop taking the lantus.  Appt rescheduled to 03/15/10. Additional Follow-up by: Lowella Petties CMA, AAMA,  March 03, 2010 2:56 PM    Additional Follow-up for Phone Call Additional follow up Details #2::    Exceedingly complex case. OK to d/c insulin for now while weaning off steroids,  Heather, help call in a glucometer, test strips, lancets generic for her. Make sure 30 min appt Follow-up by: Hannah Beat MD,  March 08, 2010 11:38 AM  Additional Follow-up for Phone Call Additional follow up Details #3:: Details for Additional Follow-up Action Taken: I did send these to the pharmacy in the computer and have tried to reach patient but, got no answer and the voice mail  box is full.  spoke with nurse and she says that patient is not going to buy the glucometer b/c she says that she doesnt have the money.Consuello Masse CMA     Noted. She can make that choice. This is a difficult case following complex ICU lengthy admission. F/u soon. Spencer Copland MD  March 09, 2010 3:04 PM  Additional Follow-up by: Benny Lennert CMA Duncan Dull),  March 08, 2010 3:32 PM  New/Updated Medications: * GENERIC GLUCOMETER use to check blood sugar (dx.250.0) * GENERIC LANCETS check blood sugar 2 times daily * GENERIC TEST STRIPS check blood sugar 2-3 times daily Prescriptions: GENERIC TEST STRIPS check blood sugar 2-3 times daily  #100 x 0   Entered by:   Benny Lennert CMA (AAMA)   Authorized by:   Hannah Beat MD   Signed by:   Benny Lennert CMA (AAMA) on 03/08/2010   Method used:   Faxed to ...       [facility]       [address]       [place], [place]  [address]       Ph: [phone]       Fax: [phone]   RxID:   [id number] GENERIC GLUCOMETER use to check blood sugar (dx.250.0)  #1 x 0  Entered by:   Benny Lennert CMA (AAMA)   Authorized by:   Hannah Beat MD   Signed by:   Benny Lennert CMA (AAMA) on 03/08/2010   Method used:   Faxed to ...       [facility]       [address]       [place], [place]  [address]       Ph: [phone]       Fax: [phone]   RxID:   [id number] GENERIC LANCETS check blood sugar 2 times daily  #100 x 2   Entered by:   Benny Lennert CMA (AAMA)   Authorized by:   Hannah Beat MD   Signed by:   Benny Lennert CMA (AAMA) on 03/08/2010   Method used:   Faxed to ...       [facility]       [address]       [place], [place]  [address]       Ph: [phone]       Fax: [phone]   RxID:   [id number]

## 2010-05-13 NOTE — Medication Information (Signed)
Summary: CCR/AMD  Anticoagulant Therapy  Managed by: Charlena Cross, RN, BSN Referring MD: [facility] PCP: Hannah Beat MD Supervising MD: Mariah Milling Indication 1: Atrial fibrillation INR POC 1.0 INR RANGE 2.0-3.0  Dietary changes: no    Health status changes: no    Bleeding/hemorrhagic complications: no    Recent/future hospitalizations: no    Any changes in medication regimen? no    Recent/future dental: no  Any missed doses?: yes     Details: pt ran out of coumadin 3 days ago.  Is patient compliant with meds? yes      Comments: pt has not been compliant with INR checks  Allergies: 1)  ! Sulfa  Anticoagulation Management History:      The patient is taking warfarin and comes in today for a routine follow up visit.  Positive risk factors for bleeding include an age of 75 years or older and presence of serious comorbidities.  The bleeding index is 'intermediate risk'.  Positive CHADS2 values include History of CHF, History of HTN, Age > 32 years old, and History of Diabetes.  Her last INR was 1.7 and today's INR is 1.0.  Anticoagulation responsible provider: [facility].  INR POC: 1.0.    Anticoagulation Management Assessment/Plan:      The patient's current anticoagulation dose is Warfarin sodium 5 mg tabs: Use as directed by Anticoagulation Clinic.  The target INR is 2.0-3.0.  The next INR is due 06/09/2009.  Results were reviewed/authorized by Charlena Cross, RN, BSN.  She was notified by Charlena Cross, RN, BSN.         Current Anticoagulation Instructions: coumadin 7.5 mg today and tomorrow then coumadin 2.5 mg daily with 5 mg in Tues Thurs and Sat. Prescriptions: WARFARIN SODIUM 5 MG TABS (WARFARIN SODIUM) Use as directed by Anticoagulation Clinic  #30 x 0   Entered by:   Charlena Cross, RN, BSN   Authorized by:   Marca Ancona, MD   Signed by:   Charlena Cross, RN, BSN on 06/02/2009   Method used:   Electronically to        [facility]  (retail)       [address]       [place], [place]  [address]       Ph: [phone]       Fax: [phone]   RxID:   [id number]

## 2010-05-13 NOTE — Assessment & Plan Note (Signed)
Summary: follow up labs (thyroid)/hmw   Vital Signs:  Patient profile:   77 year old female Height:      63 inches Weight:      212.6 pounds BMI:     37.80 Temp:     97.7 degrees F oral Pulse rate:   64 / minute Pulse rhythm:   regular BP sitting:   130 / 70  (left arm) Cuff size:   large  Vitals Entered By: Benny Lennert CMA Duncan Dull) (August 03, 2009 12:04 PM)  History of Present Illness: Chief complaint follow up labs  Thyroid: hypothyroidism, new diagnosis. Does not feel very symptomatic, some fatigue. Dry skin. Otherwise, feels OK  HTN, at goal.  CKD, sees Dr. Stark Bray  Needs pneumonia vaccine  COPD, stable, seeing pulmonary and compliant  AF, rate controlled on Coumadin and doing well. On Amiodarone, as well.  Allergies: 1)  ! Sulfa  Past History:  Past medical, surgical, family and social histories (including risk factors) reviewed, and no changes noted (except as noted below).  Past Medical History: 1. Prediabetes.  2. Obesity. 3. Moderate COPD.  PFTs (12/10): FVC 67%, FEV1 58%, ratio 60%, TLC 95%, RV 138%, DLCO 43% (moderate obstructive defect).  4. Diastolic congestive heart failure.  Most recent echo April 2010 showed mild LVH, EF 55-60%, mild left atrial enlargement, and mild mitral regurgitation. 5. Atrial fibrillation. 07/2008,  failed cardioversion with TEE guidance in May 2010.  Patient went back into NSR by 8/10 with amiodarone.  6. Chronic kidney disease, Cr 2.4 when last checked.  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  7. History of pneumonia with ARDS in 2005.  8. 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 Possible obstructive sleep apnea.  When the patient was sedated for TEE in May 2010, she did develop some upper airway obstruction. 10.Adenosine myoview (5/10):  Normal LV EF and no scar or ischemia (normal stress). 11. Anemia of chronic disease 12. Hypothyroidism   Primary = Dr. Patsy Lager Cardiology = Dr.  Shirlee Latch Pulmonology = Dr. Delford Field Nephrology = Dr. Arrie Aran  Past Surgical History: Reviewed history from 09/22/2008 and no changes required. h/o tracheostomy ARDS / ICU admission, Trach (2005) Microperforation of jejunem, ICU admission, 08/2008 Acute on Chronic renal failure, 08/2008 COPD exacerbation, mild respiratory failure, 08/2008 A. Fib with RVR, 08/2008  09/2008, ICU Admission, Intubated: Resp failure, PNA, A. Fib, RF 08/2008, Adenosine Myoview, EF 68% Echo, 07/2008, MR, EF 55%, LAE, mild LVH  06/2006 PFT's: LABORATORY DATA:  Spirometry was obtained today in the office, revealing severe obstructive defect, an FEV1 of 48% predicted, FVC of 61% predicted, FEV1:FVC ratio of 78%.  The patient ambulates around the office and drops to 88%, but no lower after 3 laps.   Family History: Reviewed history from 11/11/2008 and no changes required. No FH premature CAD emphysema: mother asthma: mother  Social History: Reviewed history from 10/07/2008 and no changes required. Married with children. Daughter, NP, is involved Tobacco Use - Former. quit 1970, started at age 25.  1 ppd pt is retired.  previously worked as a Production designer, theatre/television/film for [employer]. Regular Exercise - yes Drug Use - no Alcohol Use - no  Review of Systems      See HPI General:  Complains of fatigue; denies chills and fever. Endo:  Denies cold intolerance, excessive hunger, excessive thirst, excessive urination, heat intolerance, and polyuria.  Physical Exam  General:  Well-developed,well-nourished,in no acute distress; alert,appropriate and cooperative throughout examination. obese. Head:  Normocephalic and atraumatic without  obvious abnormalities. Ears:  no external deformities.   Nose:  no external deformity.   Lungs:  normal respiratory effort, no intercostal retractions, and no accessory muscle use.  decreased BS, no focal crackles and no wheezing Heart:  RRR, no m/g/r on my exam Extremities:  tr LE  edema Neurologic:  alert & oriented X3 and gait normal.   Psych:  Cognition and judgment appear intact. Alert and cooperative with normal attention span and concentration. No apparent delusions, illusions, hallucinations   Impression & Recommendations:  Problem # 1:  HYPOTHYROIDISM (ICD-244.9) Assessment New  start low dose supplementation and recheck levels in a few months  Her updated medication list for this problem includes:    Levothyroxine Sodium 50 Mcg Tabs (Levothyroxine sodium) .Marland Kitchen... 1 by mouth daily  Orders: Prescription Created Electronically [phone])  Problem # 2:  HYPERTENSION, UNSPECIFIED (ICD-401.9)  Her updated medication list for this problem includes:    Metoprolol Succinate 25 Mg Xr24h-tab (Metoprolol succinate) .Marland Kitchen... 1 tab once daily    Furosemide 20 Mg Tabs (Furosemide) .Marland Kitchen... 1 tab two times a day    Amlodipine Besylate 10 Mg Tabs (Amlodipine besylate) .Marland Kitchen... Take one tablet by mouth daily    Hydralazine Hcl 25 Mg Tabs (Hydralazine hcl) .Marland Kitchen... Take one tablet by mouth three times a day  Problem # 3:  ATRIAL FIBRILLATION (ICD-427.31) Assessment: Unchanged  Her updated medication list for this problem includes:    Warfarin Sodium 5 Mg Tabs (Warfarin sodium) ..... Use as directed by anticoagulation clinic    Metoprolol Succinate 25 Mg Xr24h-tab (Metoprolol succinate) .Marland Kitchen... 1 tab once daily    Amlodipine Besylate 10 Mg Tabs (Amlodipine besylate) .Marland Kitchen... Take one tablet by mouth daily    Amiodarone Hcl 200 Mg Tabs (Amiodarone hcl) .Marland Kitchen... 1 tab daily  Problem # 4:  RENAL FAILURE, CHRONIC (ICD-585.9) Assessment: Improved Cr has much improved  Labs Reviewed: BUN: 58 (07/16/2009)   Cr: 2.05 (07/16/2009)    Hgb: 10.0 (09/22/2008)   Hct: 29.2 (09/22/2008)   Ca++: 9.4 (07/16/2009)   Phos: 4.1 (09/29/2008) TP: 7.2 (07/16/2009)   Alb: 4.4 (07/16/2009)  Problem # 5:  COPD (ICD-496)  Her updated medication list for this problem includes:    Spiriva Handihaler 18 Mcg Caps  (Tiotropium bromide monohydrate) ..... Once daily    Advair Diskus 250-50 Mcg/dose Misc (Fluticasone-salmeterol) .Marland Kitchen... 1 puff two times a day    Proair Hfa 108 (90 Base) Mcg/act Aers (Albuterol sulfate) .Marland Kitchen... 2 inh q4h as needed shortness of breath    Duoneb 0.5-2.5 (3) Mg/5ml Soln (Ipratropium-albuterol) ..... Use two times a day as needed shortness of breath  Complete Medication List: 1)  Warfarin Sodium 5 Mg Tabs (Warfarin sodium) .... Use as directed by anticoagulation clinic 2)  Spiriva Handihaler 18 Mcg Caps (Tiotropium bromide monohydrate) .... Once daily 3)  Multivitamins Tabs (Multiple vitamin) .... Once daily 4)  Metoprolol Succinate 25 Mg Xr24h-tab (Metoprolol succinate) .Marland Kitchen.. 1 tab once daily 5)  Furosemide 20 Mg Tabs (Furosemide) .Marland Kitchen.. 1 tab two times a day 6)  Amlodipine Besylate 10 Mg Tabs (Amlodipine besylate) .... Take one tablet by mouth daily 7)  Lipitor 20 Mg Tabs (Atorvastatin calcium) .... Take one tablet by mouth daily. 8)  Amiodarone Hcl 200 Mg Tabs (Amiodarone hcl) .Marland Kitchen.. 1 tab daily 9)  Advair Diskus 250-50 Mcg/dose Misc (Fluticasone-salmeterol) .Marland Kitchen.. 1 puff two times a day 10)  Proair Hfa 108 (90 Base) Mcg/act Aers (Albuterol sulfate) .... 2 inh q4h as needed shortness of breath 11)  Duoneb 0.5-2.5 (3) Mg/58ml Soln (Ipratropium-albuterol) .... Use two times a day as needed shortness of breath 12)  Oxygen  .... 2l at bedtime and as needed during the day 13)  Hydralazine Hcl 25 Mg Tabs (Hydralazine hcl) .... Take one tablet by mouth three times a day 14)  Tricor 48 Mg Tabs (Fenofibrate) .Marland Kitchen.. 1 tab by mouth daily 15)  Levothyroxine Sodium 50 Mcg Tabs (Levothyroxine sodium) .Marland Kitchen.. 1 by mouth daily  Patient Instructions: 1)  CALL ABOUT SHINGLES VACCINE (ZOSTAVAX) AND CHECK IF IT IS COVERED. 2)  f/u 3 months 3)  TSH, Free T4, Free T3, several days before Prescriptions: LEVOTHYROXINE SODIUM 50 MCG TABS (LEVOTHYROXINE SODIUM) 1 by mouth daily  #30 x 6   Entered and Authorized  by:   Hannah Beat MD   Signed by:   Hannah Beat MD on 08/03/2009   Method used:   Electronically to        [facility] (retail)       [address]       [place], [place]  [address]       Ph: [phone]       Fax: [phone]   RxID:   [id number] TRICOR 48 MG TABS (FENOFIBRATE) 1 tab by mouth daily  #30 x 6   Entered and Authorized by:   Hannah Beat MD   Signed by:   Hannah Beat MD on 08/03/2009   Method used:   Electronically to        [facility] (retail)       [address]       [place], [place]  [address]       Ph: [phone]       Fax: [phone]   RxID:   [id number]   Current Allergies (reviewed today): ! SULFA  Appended Document: follow up labs (thyroid)/hmw    Clinical Lists Changes  Orders: Added new Service order of Pneumococcal Vaccine (88416) - Signed Added new Service order of Admin 1st Vaccine ([address]) - Signed Observations: Added new observation of PNEUMOVAXVIS: 11/07/95 version given August 03, 2009. (08/03/2009 14:27) Added new observation of PNEUMOVAXLOT: 0211aa (08/03/2009 14:27) Added new observation of PNEUMOVAXEXP: 02/03/2011 (08/03/2009 14:27) Added new observation of PNEUMOVAXBY: Heather Woodard CMA (AAMA) (08/03/2009 14:27) Added new observation of PNEUMOVAXRTE: IM (08/03/2009 14:27) Added new observation of PNEUMOVAXDOS: 0.5 ml (08/03/2009 14:27) Added new observation of PNEUMOVAXMFR: Merck (08/03/2009 14:27) Added new observation of PNEUMOVAXSIT: left deltoid (08/03/2009 14:27) Added new observation of PNEUMOVAX: Pneumovax (08/03/2009 14:27)       Immunizations Administered:  Pneumonia Vaccine:    Vaccine Type: Pneumovax    Site: left deltoid    Mfr: Merck    Dose: 0.5 ml    Route: IM    Given by: Benny Lennert CMA (AAMA)    Exp. Date: 02/03/2011    Lot #: [id number]    VIS given: 11/07/95 version given August 03, 2009.

## 2010-05-13 NOTE — Progress Notes (Signed)
Summary: regarding meds  Phone Note Call from Patient   Caller: Daughter in law  Selena Batten  [phone] Summary of Call: Pt's daughter in law called to report that pt had been taking tricor 45 mg and hydralazine.  She is out of these and Selena Batten is asking if you want her to continue with these.  Also, lovaza is too costly so she is asking if pt should take otc fish oil, and if so, how much.  Uses [facility]. Initial call taken by: Lowella Petties CMA, AAMA,  March 16, 2010 2:30 PM  Follow-up for Phone Call        Lovava: ok to change to 1200 units of OTC fish oil by mouth two times a day   I knew she was on hydralazine.  Tricor 45 mg, 1 by mouth daily, #30, 11 refills.  to her pharmacy  update med list. Hannah Beat MD  March 16, 2010 3:32 PM     New/Updated Medications: TRICOR 48 MG TABS (FENOFIBRATE) ONE TABLET BY MOUTH DAILY Prescriptions: TRICOR 48 MG TABS (FENOFIBRATE) ONE TABLET BY MOUTH DAILY  #30 x 11   Entered by:   Benny Lennert CMA (AAMA)   Authorized by:   Hannah Beat MD   Signed by:   Benny Lennert CMA (AAMA) on 03/17/2010   Method used:   Electronically to        [facility] (retail)       [address]       [place], [place]  [address]       Ph: [phone]       Fax: [phone]   RxID:   [id number]

## 2010-05-13 NOTE — Medication Information (Signed)
Summary: rov/ewj  Anticoagulant Therapy  Managed by: Cloyde Reams, RN, BSN Referring MD: [facility] PCP: Hannah Beat MD Supervising MD: Mariah Milling Indication 1: Atrial fibrillation Lab Used: [facility] [place] Site: [facility] INR POC 1.2 INR RANGE 2.0-3.0  Dietary changes: no    Health status changes: yes       Details: Pt fell 2-3 weeks ago, bruised on R side of body.    Bleeding/hemorrhagic complications: yes       Details: Bruising s/p fall.  Recent/future hospitalizations: no    Any changes in medication regimen? no    Recent/future dental: no  Any missed doses?: no       Is patient compliant with meds? yes       Allergies: 1)  ! Sulfa  Anticoagulation Management History:      The patient is taking warfarin and comes in today for a routine follow up visit.  Positive risk factors for bleeding include an age of 27 years or older and presence of serious comorbidities.  The bleeding index is 'intermediate risk'.  Positive CHADS2 values include History of CHF, History of HTN, Age > 52 years old, and History of Diabetes.  Her last INR was 1.0.  Anticoagulation responsible Vivianne Carles: Gollan.  INR POC: 1.2.  Cuvette Lot#: [id number].  Exp: 01/2011.    Anticoagulation Management Assessment/Plan:      The patient's current anticoagulation dose is Warfarin sodium 5 mg tabs: Use as directed by Anticoagulation Clinic.  The target INR is 2.0-3.0.  The next INR is due 01/13/2010.  Anticoagulation instructions were given to patient.  Results were reviewed/authorized by Cloyde Reams, RN, BSN.  She was notified by Cloyde Reams RN.         Prior Anticoagulation Instructions: INR 2.9  Continue on same dosage 1/2 tablet daily except 1 tablet on Mondays.  Recheck in 4 weeks.    Current Anticoagulation Instructions: INR 1.2  Take 1 tablet today, then resume same dosage 1/2 tablet daily except 1 tablet on Mondays.  Recheck in 10 days-2 weeks.

## 2010-05-13 NOTE — Progress Notes (Signed)
Summary: Needs f/u OV---HFU on 03/25/10 in [facility] office---confirmed w/ pt  Phone Note Outgoing Call   Reason for Call: Confirm/change Appt Summary of Call: this pt will need a post hosp f/u with me at some point soon Initial call taken by: Storm Frisk MD,  March 16, 2010 9:27 AM  Follow-up for Phone Call        Pt is already sch for hfu in the [place] office on Thurs., 03/25/2010 @ 12pm. This was confirmed with the patient. Follow-up by: Michel Bickers CMA,  March 16, 2010 10:14 AM

## 2010-05-13 NOTE — Assessment & Plan Note (Signed)
Summary: ROV/AMD   Visit Type:  rov Primary Provider:  Hannah Beat MD  CC:  no cardiac complaints today.  History of Present Illness: 77 yo with history of COPD, atrial fibrillation, diastolic CHF, and CKD presents for followup.  She is in sinus rhythm on amiodarone.  She has been feeling well in general but has gained 27 lbs over the winter according to our scale (over the last 6 months).  She says that she was very inactive over the winter due to the cold and admits to significant dietary indiscretion.  She has to stop to rest after walking about 50-100 feet. This is better than in the past.  She is able to climb a flight of steps slowly.  Her triglycerides were very high when checked in 11/10, but she did not start Lovaza or Tricor as suggested.  Her heart rate is mildly bradycardic (49 when measured).    Labs (8/10): K 4.2, creatinine 2.8 Labs (11/10): creatinine 2.39, TGs 558, HDL 23 Labs (4/11): creatinine 2.05, K 4.4, LFTs normal   Current Medications (verified): 1)  Warfarin Sodium 5 Mg Tabs (Warfarin Sodium) .... Use As Directed By Anticoagulation Clinic 2)  Spiriva Handihaler 18 Mcg Caps (Tiotropium Bromide Monohydrate) .... Once Daily 3)  Multivitamins   Tabs (Multiple Vitamin) .... Once Daily 4)  Metoprolol Succinate 25 Mg Xr24h-Tab (Metoprolol Succinate) .Marland Kitchen.. 1 Tab Once Daily 5)  Furosemide 20 Mg Tabs (Furosemide) .Marland Kitchen.. 1 Tab Two Times A Day 6)  Amlodipine Besylate 10 Mg Tabs (Amlodipine Besylate) .... Take One Tablet By Mouth Daily 7)  Lipitor 20 Mg Tabs (Atorvastatin Calcium) .... Take One Tablet By Mouth Daily. 8)  Amiodarone Hcl 200 Mg Tabs (Amiodarone Hcl) .Marland Kitchen.. 1 Tab Daily 9)  Advair Diskus 250-50 Mcg/dose Misc (Fluticasone-Salmeterol) .Marland Kitchen.. 1 Puff Two Times A Day 10)  Proair Hfa 108 (90 Base) Mcg/act  Aers (Albuterol Sulfate) .... 2 Inh Q4h As Needed Shortness of Breath 11)  Duoneb 0.5-2.5 (3) Mg/21ml Soln (Ipratropium-Albuterol) .... Use Two Times A Day As Needed  Shortness of Breath 12)  Oxygen .... 2l At Bedtime and As Needed During The Day 13)  Hydralazine Hcl 25 Mg Tabs (Hydralazine Hcl) .... Take One Tablet By Mouth Three Times A Day  Allergies: 1)  ! Sulfa  Past History:  Past Medical History: 1. Type 2 diabetes.  2. Obesity. 3. Moderate COPD.  PFTs (12/10): FVC 67%, FEV1 58%, ratio 60%, TLC 95%, RV 138%, DLCO 43% (moderate obstructive defect).  4. Diastolic congestive heart failure.  Most recent echo April 2010 showed mild LVH, EF 55-60%, mild left atrial enlargement, and mild mitral regurgitation. 5. Atrial fibrillation. 07/2008,  failed cardioversion with TEE guidance in May 2010.  Patient went back into NSR by 8/10 with amiodarone.  6. Chronic kidney disease, Cr 2.4 when last checked.  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  7. History of pneumonia with ARDS in 2005.  8. 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 Possible obstructive sleep apnea.  When the patient was sedated for TEE in May 2010, she did develop some upper airway obstruction. 10.Adenosine myoview (5/10):  Normal LV EF and no scar or ischemia (normal stress). 11. Anemia of chronic disease   Primary = Dr. Patsy Lager Cardiology = Dr. Shirlee Latch Pulmonology = Dr. Delford Field Nephrology = Dr. Arrie Aran  Family History: Reviewed history from 11/11/2008 and no changes required. No FH premature CAD emphysema: mother asthma: mother  Social History: Reviewed history from 10/07/2008 and no changes required. Married with  children. Daughter, NP, is involved Tobacco Use - Former. quit 1970, started at age 44.  1 ppd pt is retired.  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   Vital Signs:  Patient profile:   77 year old female Height:      63 inches Weight:      213 pounds BMI:     37.87 Pulse rate:    49 / minute Pulse rhythm:   irregular BP sitting:   130 / 78  (left arm) Cuff size:   large  Vitals Entered By: Danielle Rankin, CMA (July 23, 2009 11:02 AM)  Physical Exam  General:  overweight female in no acute distress Neck:  Neck supple, no JVD. No masses, thyromegaly or abnormal cervical nodes. Lungs:  Clear bilaterally to auscultation and percussion. Heart:  Non-displaced PMI, chest non-tender; regular rate and rhythm, S1, S2 without murmurs, rubs or gallops. Carotid upstroke normal, no bruit. Pedals normal pulses. 1+ edema 1/2 up left leg, trace ankle edema on the right (left chonically more swollen than right per patient) Abdomen:  Bowel sounds positive; abdomen soft and non-tender without masses, organomegaly, or hernias noted. No hepatosplenomegaly. Obese.  Extremities:  No clubbing or cyanosis. Neurologic:  Alert and oriented x 3. Psych:  Normal affect.   Impression & Recommendations:  Problem # 1:  HYPERTENSION, UNSPECIFIED (ICD-401.9) Good BP control.   Problem # 2:  ATRIAL FIBRILLATION (ICD-427.31) Patient remains in NSR on amiodarone.  Recent PFTs were done as well as LFTs.  She needs thyroid function tests done.  I have asked her to have an eye exam now and then yearly.    Problem # 3:  DIASTOLIC HEART FAILURE, CHRONIC (ICD-428.32) No JVD.  Continue current dose of Lasix.    Problem # 4:  OVERWEIGHT/OBESITY (ICD-278.02) We extensively discussed diet and exercise which I think are imperative for her .  Problem # 5:  RENAL FAILURE, CHRONIC (ICD-585.9) Followup with renal re: pauci-immune glomerulonephritis.   Other Orders: T-Lipid Profile [phone]) T-TSH [phone]) T-T3, Free [phone]) T-T4, Free [id number])  Patient Instructions: 1)  Your physician recommends that you schedule a follow-up appointment in: 6 months 2)  Your physician recommends that you continue on your current medications as directed. Please refer to the Current Medication list  given to you today. 3)  You have been referred to your eye doctor for yearly exam.

## 2010-05-13 NOTE — Medication Information (Signed)
Summary: CCR/AMD  Anticoagulant Therapy  Managed by: Cloyde Reams, RN, BSN Referring MD: [facility] PCP: Hannah Beat MD Supervising MD: Mariah Milling Indication 1: Atrial fibrillation Lab Used: [facility] [place] Site: [place] INR POC 5.2 INR RANGE 2.0-3.0  Dietary changes: no    Health status changes: no    Bleeding/hemorrhagic complications: no    Recent/future hospitalizations: no    Any changes in medication regimen? yes       Details: Started on Levothyroid and Tricor approx 1 week ago.    Recent/future dental: no  Any missed doses?: no       Is patient compliant with meds? yes       Allergies (verified): 1)  ! Sulfa  Anticoagulation Management History:      The patient is taking warfarin and comes in today for a routine follow up visit.  Positive risk factors for bleeding include an age of 75 years or older and presence of serious comorbidities.  The bleeding index is 'intermediate risk'.  Positive CHADS2 values include History of CHF, History of HTN, Age > 75 years old, and History of Diabetes.  Her last INR was 1.0.  Anticoagulation responsible provider: Gollan.  INR POC: 5.2.  Cuvette Lot#: [id number].  Exp: 08/2010.    Anticoagulation Management Assessment/Plan:      The patient's current anticoagulation dose is Warfarin sodium 5 mg tabs: Use as directed by Anticoagulation Clinic.  The target INR is 2.0-3.0.  The next INR is due 08/18/2009.  Anticoagulation instructions were given to patient.  Results were reviewed/authorized by Cloyde Reams, RN, BSN.  She was notified by Cloyde Reams RN.         Prior Anticoagulation Instructions: no coumadin today then coumadin 5 mg daily with 2.5 mg on M W F Sat  Current Anticoagulation Instructions: INR 5.2  Skip 2 days of coumadin, then decrease dosage to 1/2 tablet daily except 1 tablet on Thursdays and Sundays.  Recheck in 1 week.

## 2010-05-13 NOTE — Letter (Signed)
Summary: Custom - Delinquent Coumadin 1  [facility] at [address]   [place], [place] [address]   Phone: [phone]  Fax: [phone]     October 28, 2009 MRN: [id number]   [facility] [address] [place], [place]  [address]   Dear Ms. Demello,  This letter is being sent to you as a reminder that it is necessary for you to get your INR/PT checked regularly so that we can optimize your care.  Our records indicate that you were scheduled to have a test done recently.  As of today, we have not received the results of this test.  It is very important that you have your INR checked.  Please call our office at the number listed above to schedule an appointment at your earliest convenience.    If you have recently had your protime checked or have discontinued this medication, please contact our office at the above phone number to clarify this issue.  Thank you for this prompt attention to this important health care matter.  Sincerely,   [facility] Team    Appended Document: [facility] - Delinquent Coumadin 1 Attempted to contact pt on 10/21/09 and 10/29/09 to r/s missed coumadin appt.  LMOM both times for pt to call back to r/s appt.  Delinquent letter sent to pt as well. EWJ

## 2010-05-13 NOTE — Progress Notes (Signed)
Summary: rx  Phone Note Call from Patient Call back at Home Phone [phone]   Caller: Patient Call For: wright Reason for Call: Refill Medication, Talk to Nurse Summary of Call: pt needs refill on her Duoneb called in to [facility] Initial call taken by: Eugene Gavia,  July 15, 2009 3:45 PM  Follow-up for Phone Call        per last OV note from 11/2008 pt was to f/i in 4 months but no appt has been made, so I scheduled pt for appt to see PW tomorrow at 12 noon at [facility] office per pt request. Refill sent for duoneb. pt aware of both. Carron Curie CMA  July 15, 2009 4:12 PM     Prescriptions: DUONEB 0.5-2.5 (3) MG/3ML SOLN (IPRATROPIUM-ALBUTEROL) use two times a day as needed shortness of breath  #60 x 5   Entered by:   Carron Curie CMA   Authorized by:   Storm Frisk MD   Signed by:   Carron Curie CMA on 07/15/2009   Method used:   Electronically to        [facility] (retail)       [address]       [place], [place]  [address]       Ph: [phone]       Fax: [phone]   RxID:   [id number]

## 2010-05-13 NOTE — Progress Notes (Signed)
Summary: pt/inr results  Phone Note From Other Clinic   Caller: [phone] ext [phone] Summary of Call: Olegario Messier from [facility] calling with patient PT/INR pt 20.1 Inr 1.69 Initial call taken by: Benny Lennert CMA Anushri Casalino [place]ull),  March 02, 2010 3:10 PM  Follow-up for Phone Call        verify all meds and doses and I can dose coumadin.  Follow-up by: Crawford Givens MD,  March 02, 2010 3:31 PM  Additional Follow-up for Phone Call Additional follow up Details #1::        Called for meds list and doses to be faxed.  Lugene Fuquay CMA Brookelynn Hamor Dull)  March 02, 2010 4:14 PM   Meds list in EMR updated to match list sent from [facility].  List in your in box.  Delilah Shan CMA Kamron Portee D[place])  March 02, 2010 4:46 PM       Additional Follow-up for Phone Call Additional follow up Details #2::    warfarin 2mg  on today, Thursday, Saturday. 3mg  (1.5 tabs) on Wednesday, Friday, Sunday.  Recheck INR/PT on Monday 03/08/10. Crawford Givens MD  March 02, 2010 5:13 PM   Reported to [place] at above number.  Lugene Fuquay CMA (AAMA)  March 02, 2010 5:20 PM   New/Updated Medications: HYDRALAZINE HCL 25 MG TABS (HYDRALAZINE HCL) Take one tablet by mouth three times a day CRESTOR 20 MG TABS (ROSUVASTATIN CALCIUM) Take 1 tab by mouth at bedtime DILTIAZEM HCL CR 240 MG XR24H-CAP (DILTIAZEM HCL) Take 1 capsule by mouth once a day GUAIFENESIN 200 MG TABS (GUAIFENESIN) Take 1 tablet by mouth two times a day LANTUS SOLOSTAR 100 UNIT/ML SOLN (INSULIN GLARGINE) Take 20 units at bedtime LOVAZA 1 GM CAPS (OMEGA-3-ACID ETHYL ESTERS) Take 1 capsule by mouth two times a day MAGNESIUM OXIDE 400 MG TABS (MAGNESIUM OXIDE) Take 1 tablet by mouth three times a day METOPROLOL TARTRATE 25 MG TABS (METOPROLOL TARTRATE) Take 1 tablet by mouth two times a day NOVOLOG PENFILL 100 UNIT/ML SOLN (INSULIN ASPART) 4 units subcutaneously three times a day OMEPRAZOLE 20 MG CPDR (OMEPRAZOLE) Take 1 tablet by mouth two times a  day OXYCODONE HCL 5 MG TABS (OXYCODONE HCL) One tablet every 4 - 6 hours as needed PREDNISONE 10 MG TABS (PREDNISONE) Take 1 tablet by mouth once a day RANITIDINE HCL 150 MG TABS (RANITIDINE HCL) Take 1 tablet by mouth once a day XOPENEX 0.63 MG/3ML NEBU (LEVALBUTEROL HCL) One inhalation as needed. CIPRO 500 MG TABS (CIPROFLOXACIN HCL) Take 1 tablet by mouth two times a day COUMADIN 2 MG TABS (WARFARIN SODIUM) Take 1 tablet by mouth once a day FUROSEMIDE 40 MG TABS (FUROSEMIDE) Take 1 tablet by mouth two times a day  Appended Document: pt/inr results Spoke with Olegario Messier, nurse with [facility].  She didnt get message from yesterday regarding pt's coumadin dosing.  Gave instructions for dosing and to recheck.

## 2010-05-13 NOTE — Progress Notes (Signed)
Summary: leg red and swollen   Phone Note Call from Patient Call back at Home Phone [phone]   Caller: Patient Call For: Heidi Beat MD Summary of Call: Patient states that she woke up this morning with her lower leg swollen and red and says that it hurts. I advised her to go to ER to rule out blood clot. Patient states that she doesn't want to go to ER of UC. We have no open appt for today. Please advise.  Initial call taken by: Melody Comas,  March 24, 2010 11:24 AM  Follow-up for Phone Call        Urgent evaluation indicated. I cannot see. Possibly one of my partners would work her in - but this needs to be seen urgently today.   Rule out cellulitis vs. clot.  Will forward to triage to help assist. Heidi Beat MD  March 24, 2010 11:32 AM   Patient advised. She is going to [place].  Follow-up by: Melody Comas,  March 24, 2010 11:47 AM

## 2010-05-13 NOTE — Progress Notes (Signed)
Summary: Refill  Phone Note Refill Request Call back at Home Phone [phone] Message from:  Patient on May 27, 2009 12:12 PM  Refills Requested: Medication #1:  WARFARIN SODIUM 5 MG TABS Use as directed by Anticoagulation Clinic [facility] in [place]  Initial call taken by: [place],  May 27, 2009 12:13 PM Caller: Patient Call For: Dr. Shirlee Latch  Follow-up for Phone Call        this patient needs follow-up in the office, 30 minute appt.  She has not had her INR checked in several months. Unacceptable and dangerous.   Schedule follow-up and ask: is there anyone outside of our office that is following her PT and INR Follow-up by: Hannah Beat MD,  June 01, 2009 8:49 AM     Appended Document: Refill Reviewed records, she had been getting INRs at [facility], but last 2 checks were in August and September.  She needs to come in today for INR and we will begin following her INR at our office in [place].   Appended Document: Refill Contacted pt in regards to INR checks.  Pt cannot come in today and attempted to postpone check until Wednesday.  Was able to convince pt to come in tomorrow for PT/INR.  Explained to pt that she would be given a Rx for a 1 month supply of warfarin when she comes to office for CCR.  Reinforced need for routine monitoring and instructed pt that she would need to be more vigilent with appointments. Charlena Cross RN BSN

## 2010-05-13 NOTE — Miscellaneous (Signed)
Summary: Order/[facility]  Order/[facility]   Imported By: Sherian Rein 03/16/2010 11:55:56  _____________________________________________________________________  External Attachment:    Type:   Image     Comment:   External Document

## 2010-05-13 NOTE — Medication Information (Signed)
Summary: CCR/NE  Anticoagulant Therapy  Managed by: Cloyde Reams, RN, BSN Referring MD: [place] PCP: Hannah Beat MD Supervising MD: Mariah Milling Indication 1: Atrial fibrillation Lab Used: [facility] [place] Site:  INR POC 2.4 INR RANGE 2.0-3.0  Dietary changes: yes       Details: ate cabbage for dinner last night    Bleeding/hemorrhagic complications: no     Any changes in medication regimen? no     Any missed doses?: no       Is patient compliant with meds? yes       Allergies: 1)  ! Sulfa  Anticoagulation Management History:      The patient is taking warfarin and comes in today for a routine follow up visit.  Positive risk factors for bleeding include an age of 75 years or older and presence of serious comorbidities.  The bleeding index is 'intermediate risk'.  Positive CHADS2 values include History of CHF, History of HTN, Age > 30 years old, and History of Diabetes.  Her last INR was 1.0.  Anticoagulation responsible Wisdom Rickey: [facility].  INR POC: 2.4.  [place] Lot#: [id number].  Exp: 01/2011.    Anticoagulation Management Assessment/Plan:      The patient's current anticoagulation dose is Warfarin sodium 5 mg tabs: Use as directed by Anticoagulation Clinic.  The target INR is 2.0-3.0.  The next INR is due 12/02/2009.  Anticoagulation instructions were given to patient.  Results were reviewed/authorized by Cloyde Reams, RN, BSN.  She was notified by Benedict Needy, RN.         Prior Anticoagulation Instructions: INR 1.6  Start taking 1/2 tablet daily except 1 tablet on Mondays.  Recheck in 2 weeks.    Current Anticoagulation Instructions: INR 2.4  Continue taking 1/2 tab daily except for 1 tab on Mondays. Recheck in 3 weeks.

## 2010-05-13 NOTE — Medication Information (Signed)
Summary: No longer Coumadin Clinic pt.  Anticoagulant Therapy  Managed by: Inactive Referring MD: D.Mclean PCP: Hannah Beat MD Supervising MD: Mariah Milling Indication 1: Atrial fibrillation Lab Used: [facility] [place] Site: [place] INR RANGE 2.0-3.0          Comments: Pt now has Coumadin followed by Dr Patsy Lager [facility]. Cloyde Reams RN  March 31, 2010 2:45 PM   Allergies: 1)  ! Sulfa  Anticoagulation Management History:      Positive risk factors for bleeding include an age of 75 years or older and presence of serious comorbidities.  The bleeding index is 'intermediate risk'.  Positive CHADS2 values include History of CHF, History of HTN, Age > 49 years old, and History of Diabetes.  Her last INR was 1.63.  Anticoagulation responsible provider: [facility].  Exp: 01/2011.    Anticoagulation Management Assessment/Plan:      The patient's current anticoagulation dose is Coumadin 2 mg tabs: as directed, Coumadin 5 mg tabs: as directed, Coumadin 6 mg tabs: as directed.  The target INR is 2.0-3.0.  The next INR is due 01/13/2010.  Anticoagulation instructions were given to patient.  Results were reviewed/authorized by Inactive.         Prior Anticoagulation Instructions: INR 1.2  Take 1 tablet today, then resume same dosage 1/2 tablet daily except 1 tablet on Mondays.  Recheck in 10 days-2 weeks.

## 2010-05-19 NOTE — Medication Information (Signed)
Summary: protime/alc  Anticoagulant Therapy  Managed by: Inactive Referring MD: D.Mclean PCP: Hannah Beat MD Supervising MD: Mariah Milling Indication 1: Atrial fibrillation Lab Used: [facility] [place] Site: [place] PT 34.4 INR RANGE 2.0-3.0           Allergies: 1)  ! Sulfa  Anticoagulation Management History:      Positive risk factors for bleeding include an age of 75 years or older and presence of serious comorbidities.  The bleeding index is 'intermediate risk'.  Positive CHADS2 values include History of CHF, History of HTN, Age > 75 years old, and History of Diabetes.  Her last INR was 1.7 and today's INR is 2.9.  Prothrombin time is 34.4.  Anticoagulation responsible provider: [facility].  Exp: 01/2011.    Anticoagulation Management Assessment/Plan:      The patient's current anticoagulation dose is Coumadin 2 mg tabs: as directed, Coumadin 5 mg tabs: as directed, Coumadin 6 mg tabs: as directed.  The target INR is 2.0-3.0.  The next INR is due 4 weeks.  Anticoagulation instructions were given to patient.  Results were reviewed/authorized by Inactive.         Prior Anticoagulation Instructions: INR 1.2  Take 1 tablet today, then resume same dosage 1/2 tablet daily except 1 tablet on Mondays.  Recheck in 10 days-2 weeks.    Laboratory Results   Blood Tests   Date/Time Recieved: May 13, 2010 11:49 AM  Date/Time Reported: May 13, 2010 11:49 AM   PT: 34.4 s   (Normal Range: 10.6-13.4)  INR: 2.9   (Normal Range: 0.88-1.12   Therap INR: 2.0-3.5)      ANTICOAGULATION RECORD PREVIOUS REGIMEN & LAB RESULTS Anticoagulation Diagnosis:  Atrial fibrillation on  09/22/2008 Previous INR Goal Range:  2.0-3.0 on  10/16/2008 Previous INR:  1.7 on  04/22/2010 Previous Coumadin Dose(mg):  5mg  on  12/30/2009 Previous Regimen:  2.5mg  qd, 5mg  T,TH,SAT on  04/22/2010 Previous Coagulation Comments:  . on  11/21/2008  NEW REGIMEN & LAB RESULTS Current  INR: 2.9 Regimen: 2.5mg  qd, 5mg  T,TH,SAT  (no change)  Provider: Cyla Haluska      Repeat testing in: 4 weeks MEDICATIONS SPIRIVA HANDIHALER 18 MCG CAPS (TIOTROPIUM BROMIDE MONOHYDRATE) once daily AMLODIPINE BESYLATE 10 MG TABS (AMLODIPINE BESYLATE) Take one tablet by mouth daily AMIODARONE HCL 200 MG TABS (AMIODARONE HCL) 1 tab daily ADVAIR DISKUS 250-50 MCG/DOSE MISC (FLUTICASONE-SALMETEROL) 1 puff two times a day * OXYGEN 2L at bedtime and as needed during the day MAGNESIUM OXIDE 400 MG TABS (MAGNESIUM OXIDE) Take 1 tablet by mouth three times a day METOPROLOL TARTRATE 25 MG TABS (METOPROLOL TARTRATE) Take 1 tablet by mouth two times a day RANITIDINE HCL 150 MG TABS (RANITIDINE HCL) Take 1 tablet by mouth once a day XOPENEX 0.63 MG/3ML NEBU (LEVALBUTEROL HCL) One inhalation as needed. COUMADIN 2 MG TABS (WARFARIN SODIUM) as directed FUROSEMIDE 40 MG TABS (FUROSEMIDE) Take 1 tablet by mouth two times a day COUMADIN 5 MG TABS (WARFARIN SODIUM) as directed COUMADIN 6 MG TABS (WARFARIN SODIUM) as directed VENTOLIN HFA 108 (90 BASE) MCG/ACT AERS (ALBUTEROL SULFATE) 1-2 puffs every 6 hours as needed SYNTHROID 25 MCG TABS (LEVOTHYROXINE SODIUM) one tablet once daily  Dose has been reviewed with patient or caretaker during this visit.  Reviewed by: Allison Quarry  Anticoagulation Visit Questionnaire      Coumadin dose missed/changed:  No      Abnormal Bleeding Symptoms:  No Any diet changes including alcohol intake, vegetables or greens  since the last visit:  No Any illnesses or hospitalizations since the last visit:  No Any signs of clotting since the last visit (including chest discomfort, dizziness, shortness of breath, arm tingling, slurred speech, swelling or redness in leg):  No

## 2010-05-28 ENCOUNTER — Other Ambulatory Visit: Payer: Self-pay

## 2010-06-04 ENCOUNTER — Encounter: Payer: Self-pay | Admitting: Cardiology

## 2010-06-04 ENCOUNTER — Ambulatory Visit (INDEPENDENT_AMBULATORY_CARE_PROVIDER_SITE_OTHER): Payer: Medicare Other | Admitting: Cardiology

## 2010-06-04 DIAGNOSIS — I5032 Chronic diastolic (congestive) heart failure: Secondary | ICD-10-CM

## 2010-06-04 DIAGNOSIS — I4891 Unspecified atrial fibrillation: Secondary | ICD-10-CM

## 2010-06-07 ENCOUNTER — Telehealth: Payer: Self-pay | Admitting: Cardiology

## 2010-06-07 LAB — CONVERTED CEMR LAB
Glucose, Bld: 118 mg/dL — ABNORMAL HIGH (ref 70–99)
Potassium: 4.2 meq/L (ref 3.5–5.3)
Pro B Natriuretic peptide (BNP): 335.7 pg/mL — ABNORMAL HIGH (ref 0.0–100.0)
Sodium: 141 meq/L (ref 135–145)
TSH: 9.989 microintl units/mL — ABNORMAL HIGH (ref 0.350–4.500)

## 2010-06-10 ENCOUNTER — Ambulatory Visit (INDEPENDENT_AMBULATORY_CARE_PROVIDER_SITE_OTHER): Payer: Medicare Other

## 2010-06-10 ENCOUNTER — Encounter: Payer: Self-pay | Admitting: Family Medicine

## 2010-06-10 DIAGNOSIS — Z5181 Encounter for therapeutic drug level monitoring: Secondary | ICD-10-CM

## 2010-06-10 DIAGNOSIS — I4891 Unspecified atrial fibrillation: Secondary | ICD-10-CM

## 2010-06-10 DIAGNOSIS — Z79899 Other long term (current) drug therapy: Secondary | ICD-10-CM

## 2010-06-10 LAB — CONVERTED CEMR LAB
INR: 3.9
Prothrombin Time: 47 s

## 2010-06-14 ENCOUNTER — Ambulatory Visit (INDEPENDENT_AMBULATORY_CARE_PROVIDER_SITE_OTHER): Payer: Medicare Other | Admitting: Family Medicine

## 2010-06-14 ENCOUNTER — Encounter: Payer: Self-pay | Admitting: Family Medicine

## 2010-06-14 DIAGNOSIS — N179 Acute kidney failure, unspecified: Secondary | ICD-10-CM

## 2010-06-14 DIAGNOSIS — I10 Essential (primary) hypertension: Secondary | ICD-10-CM

## 2010-06-14 DIAGNOSIS — I5032 Chronic diastolic (congestive) heart failure: Secondary | ICD-10-CM

## 2010-06-14 DIAGNOSIS — J449 Chronic obstructive pulmonary disease, unspecified: Secondary | ICD-10-CM

## 2010-06-14 DIAGNOSIS — I4891 Unspecified atrial fibrillation: Secondary | ICD-10-CM

## 2010-06-14 DIAGNOSIS — E039 Hypothyroidism, unspecified: Secondary | ICD-10-CM

## 2010-06-14 DIAGNOSIS — E785 Hyperlipidemia, unspecified: Secondary | ICD-10-CM

## 2010-06-14 DIAGNOSIS — I509 Heart failure, unspecified: Secondary | ICD-10-CM

## 2010-06-17 ENCOUNTER — Other Ambulatory Visit: Payer: Self-pay | Admitting: Cardiology

## 2010-06-17 ENCOUNTER — Other Ambulatory Visit (INDEPENDENT_AMBULATORY_CARE_PROVIDER_SITE_OTHER): Payer: Medicare Other

## 2010-06-17 DIAGNOSIS — I5032 Chronic diastolic (congestive) heart failure: Secondary | ICD-10-CM

## 2010-06-17 NOTE — Assessment & Plan Note (Signed)
Summary: F2M   Visit Type:  Follow-up Primary Provider:  Hannah Beat MD  CC:  c/o swelling in both legs and feet and ankles; more on the left and shortness of breath..  History of Present Illness: 77 yo with history of COPD, atrial fibrillation, diastolic CHF, and CKD presents for followup.  She remains in sinus rhythm on amiodarone. Since I last saw her, she has gained about 13 lbs.  She is short of breath after walking 50 feet.  She is using her walker.  No orthopnea.  No chest pain.  She is using her oxygen at night but not during the day.  Oxygen saturation today is 84% on room air at rest.    Labs (8/10): K 4.2, creatinine 2.8 Labs (11/10): creatinine 2.39, TGs 558, HDL 23 Labs (4/11): creatinine 2.05, K 4.4, LFTs normal, TGs 406, HDL 23 Labs (12/11): creatinine 2.5, K 3.9, LFTs normal, HCT 31.3 Labs (2/12): creatinine 2.55, K 4.5, TSH 9.9, BNP 336  ECG: NSR with 1st degree AV block, LAFB, poor anterior R wave progression   Current Medications (verified): 1)  Spiriva Handihaler 18 Mcg Caps (Tiotropium Bromide Monohydrate) .... Once Daily 2)  Amiodarone Hcl 200 Mg Tabs (Amiodarone Hcl) .Marland Kitchen.. 1 Tab Daily 3)  Advair Diskus 250-50 Mcg/dose Misc (Fluticasone-Salmeterol) .Marland Kitchen.. 1 Puff Two Times A Day 4)  Oxygen .... 2l At Bedtime and As Needed During The Day 5)  Magnesium Oxide 400 Mg Tabs (Magnesium Oxide) .... Take 1 Tablet By Mouth Three Times A Day 6)  Metoprolol Tartrate 25 Mg Tabs (Metoprolol Tartrate) .... Take 1 Tablet By Mouth Two Times A Day 7)  Ranitidine Hcl 150 Mg Tabs (Ranitidine Hcl) .... Take 1 Tablet By Mouth Once A Day 8)  Xopenex 0.63 Mg/39ml Nebu (Levalbuterol Hcl) .... One Inhalation As Needed. 9)  Coumadin 2 Mg Tabs (Warfarin Sodium) .... As Directed 10)  Furosemide 40 Mg Tabs (Furosemide) .... Take 1 Tablet By Mouth Two Times A Day 11)  Coumadin 5 Mg Tabs (Warfarin Sodium) .... As Directed 12)  Coumadin 6 Mg Tabs (Warfarin Sodium) .... As Directed 13)  Ventolin  Hfa 108 (90 Base) Mcg/act Aers (Albuterol Sulfate) .Marland Kitchen.. 1-2 Puffs Every 6 Hours As Needed 14)  Synthroid 25 Mcg Tabs (Levothyroxine Sodium) .... One Tablet Once Daily  Allergies (verified): 1)  ! Sulfa  Past History:  Past Surgical History: Last updated: 03/15/2010 h/o tracheostomy ARDS / ICU admission, Trach (2005) Resp Failure, MRSA PNA (02/2010) Microperforation of jejunem, ICU admission, 08/2008 Acute on Chronic renal failure, 08/2008 COPD exacerbation, mild respiratory failure, 08/2008 A. Fib with RVR, 08/2008  09/2008, ICU Admission, Intubated: Resp failure, PNA, A. Fib, RF 08/2008, Adenosine Myoview, EF 68% Echo, 07/2008, MR, EF 55%, LAE, mild LVH  06/2006 PFT's: LABORATORY DATA:  Spirometry was obtained today in the office, revealing severe obstructive defect, an FEV1 of 48% predicted, FVC of 61% predicted, FEV1:FVC ratio of 78%.  The patient ambulates around the office and drops to 88%, but no lower after 3 laps.   Family History: Last updated: 11/11/2008 No FH premature CAD emphysema: mother asthma: mother  Social History: Last updated: 10/07/2008 Married with children. Daughter, NP, is involved Tobacco Use - Former. quit 1970, started at age 74.  1 ppd pt is retired.  previously worked as a Production designer, theatre/television/film for [employer]. Regular Exercise - yes Drug Use - no Alcohol Use - no  Risk Factors: Exercise: yes (08/20/2008)  Risk Factors: Smoking Status: quit > 6 months (03/25/2010)  Packs/Day: 1.0 (03/25/2010)  Past Medical History: Reviewed history from 03/22/2010 and no changes required. 1. Type II diabetes 2. Obesity. 3. Moderate COPD.  PFTs (12/10): FVC 67%, FEV1 58%, ratio 60%, TLC 95%, RV 138%, DLCO 43% (moderate obstructive defect).  4. Diastolic congestive heart failure.  Most recent echo April 2010 showed mild LVH, EF 55-60%, mild left atrial enlargement, and mild mitral regurgitation. 5. Atrial fibrillation. 07/2008,  failed cardioversion with TEE  guidance in May 2010.  Patient went back into NSR by 8/10 with amiodarone.  6. Chronic kidney disease, Cr 2.4 when last checked.  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  7. History of pneumonia with ARDS in 2005, MRSA PNA with prolonged hospitalization in [facility] in 11/11.  8. 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 Possible obstructive sleep apnea.  When the patient was sedated for TEE in May 2010, she did develop some upper airway obstruction. 10. Adenosine myoview (5/10):  Normal LV EF and no scar or ischemia (normal stress). 11. Anemia of chronic disease 12. Hypothyroidism 13. Gout   Primary = Dr. Patsy Lager Cardiology = Dr. Shirlee Latch Pulmonology = Dr. Delford Field Nephrology = Dr. Arrie Aran  Family History: Reviewed history from 11/11/2008 and no changes required. No FH premature CAD emphysema: mother asthma: mother  Social History: Reviewed history from 10/07/2008 and no changes required. Married with children. Daughter, NP, is involved Tobacco Use - Former. quit 1970, started at age 78.  1 ppd pt is retired.  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   Vital Signs:  Patient profile:   77 year old female Height:      63 inches Weight:      216 pounds BMI:     38.40 O2 Sat:      84 % on Room air Pulse rate:   55 / minute BP sitting:   146 / 69  (left arm) Cuff size:   large  Vitals Entered By: Bishop Dublin, CMA (June 04, 2010 2:19 PM)  O2 Flow:  Room air  Physical Exam  General:  Well developed, well nourished, in no acute distress.  Obese.  Neck:  Neck supple, JVP 9-10 cm. No masses, thyromegaly or abnormal cervical nodes. Lungs:  Mildly prolonged expiratory phase, no crackles.  Heart:  Non-displaced PMI, chest non-tender; regular rate and rhythm, S1, S2 without murmurs, rubs or  gallops. Carotid upstroke normal, no bruit. 2+ edema to knees bilaterally.  Abdomen:  Bowel sounds positive; abdomen soft and non-tender without masses, organomegaly, or hernias noted. No hepatosplenomegaly. Extremities:  No clubbing or cyanosis. Neurologic:  Alert and oriented x 3. Psych:  Normal affect.   Impression & Recommendations:  Problem # 1:  DIASTOLIC HEART FAILURE, CHRONIC (ICD-428.32) Patient is volume overloaded on exam today with NYHA class III symptoms.  She has gained 13 lbs.  It does not help that she is hypoxic and not wearing her home oxygen, as this can combine with her diastolic LV dysfunction to lead to significant pulmonary hypertension.  Increase Lasix to 80 mg qam, 40 mg qpm.  She needs to go back on oxygen at all times.  I will also get an echo to reassess her LV/RV function and to look for pulmonary HTN.  Followup in the office in 2 weeks.   Problem # 2:  HYPOTHYROIDISM (ICD-244.9) TSH is still high.  I am going to have her increase Synthroid to 50 micrograms daily with followup TSH in 1 month.   Problem # 3:  ATRIAL FIBRILLATION (ICD-427.31) Remains in NSR.  Very symptomatic when in atrial fibrillation.  Continue metoprolol, amiodarone 200 mg daily, and warfarin.  LFTs have been ok, has hypothyroidism that is being treated.  Needs yearly eye exams while on amiodarone.  Will continue amiodarone for now as think lung issues are due to COPD and OSA unless pulmonary thinks otherwise.   Problem # 4:  COPD (ICD-496) Hypoxic at rest  today.  Has moderate COPD, diastolic CHF, and possible obesity-hypoventilation syndrome.  As above, should be on oxygen at all times.   Problem # 5:  HYPERLIPIDEMIA-MIXED (ICD-272.4) I am going to have her restart her Lipitor and fenofibrate.  Not sure why she is off them.  Triglycerides have been dangerously high in the past. Lipids/LFTs in 2 months.    Other Orders: EKG w/ Interpretation (93000) T-Basic Metabolic Panel  (16109-60454) T-BNP  (B Natriuretic Peptide) (09811-91478) T-TSH [phone]) Echocardiogram (Echo)  Patient Instructions: 1)  Your physician recommends that you schedule a follow-up appointment in: 2 weeks 2)  TO BE SCHEDULED IN 2 WEEKS WITH F/U:  Your physician recommends that you return for a FASTING lipid profile: (BMP) 3)  Your physician has recommended you make the following change in your medication: INCREASE Furosemide (Lasix) to 80mg  in the AM and 40mg  in the PM. START Lipitor 20mg  once daily. RESTART Tricor 48mg  once daily. 4)  TO BE SCHEDULED BEFORE 2 WEEK F/U:  Your physician has requested that you have an echocardiogram.  Echocardiography is a painless test that uses sound waves to create images of your heart. It provides your doctor with information about the size and shape of your heart and how well your heart's chambers and valves are working.  This procedure takes approximately one hour. There are no restrictions for this procedure. Prescriptions: TRICOR 48 MG TABS (FENOFIBRATE) Take one tablet by mouth once daily.  #60 x 6   Entered by:   Lanny Hurst RN   Authorized by:   Marca Ancona, MD   Signed by:   Lanny Hurst RN on 06/04/2010   Method used:   Electronically to        [facility] (retail)       [address]       [place], [place]  [address]       Ph: [phone]       Fax: [phone]   RxID:   [id number] LIPITOR 20 MG TABS (ATORVASTATIN CALCIUM) Take one tablet by mouth daily.  #30 x 6   Entered by:   Lanny Hurst RN   Authorized by:   Marca Ancona, MD   Signed by:   Lanny Hurst RN on 06/04/2010   Method used:   Electronically to        [facility] (retail)       [address]       [place], [place]  [address]       Ph: [phone]       Fax: [phone]   RxID:   [id number] FUROSEMIDE 40 MG TABS (FUROSEMIDE) Take 2 tablets by mouth in AM and 1 tablet by mouth in PM  #90 x 6    Entered by:  Lanny Hurst RN   Authorized by:   Marca Ancona, MD   Signed by:   Lanny Hurst RN on 06/04/2010   Method used:   Electronically to        [facility] (retail)       [address]       [place], [place]  [address]       Ph: [phone]       Fax: [phone]   RxID:   [id number]

## 2010-06-17 NOTE — Medication Information (Signed)
Summary: PROTIME  Anticoagulant Therapy  Managed by: Inactive Referring MD: D.Mclean PCP: Hannah Beat MD Supervising MD: Mariah Milling Indication 1: Atrial fibrillation Lab Used: [facility] [place] Site: [place] [facility] 47.0 INR RANGE 2.0-3.0           Allergies: 1)  ! Sulfa  Anticoagulation Management History:      Positive risk factors for bleeding include an age of 34 years or older and presence of serious comorbidities.  The bleeding index is 'intermediate risk'.  Positive CHADS2 values include History of CHF, History of HTN, Age > 1 years old, and History of Diabetes.  Her last INR was 2.9 and today's INR is 3.9.  Prothrombin time is 47.0.  Anticoagulation responsible provider: Gollan.  Exp: 01/2011.    Anticoagulation Management Assessment/Plan:      The patient's current anticoagulation dose is Coumadin 2 mg tabs: as directed, Coumadin 5 mg tabs: as directed, Coumadin 6 mg tabs: as directed.  The target INR is 2.0-3.0.  The next INR is due 2 weeks.  Anticoagulation instructions were given to patient.  Results were reviewed/authorized by Inactive.         Prior Anticoagulation Instructions: INR 1.2  Take 1 tablet today, then resume same dosage 1/2 tablet daily except 1 tablet on Mondays.  Recheck in 10 days-2 weeks.     ANTICOAGULATION RECORD PREVIOUS REGIMEN & LAB RESULTS Anticoagulation Diagnosis:  Atrial fibrillation on  09/22/2008 Previous INR Goal Range:  2.0-3.0 on  10/16/2008 Previous INR:  2.9 on  05/13/2010 Previous Coumadin Dose(mg):  5mg  on  12/30/2009 Previous Regimen:  2.5mg  qd, 5mg  T,TH,SAT on  04/22/2010 Previous Coagulation Comments:  . on  11/21/2008  NEW REGIMEN & LAB RESULTS Current INR: 3.9 Current Coumadin Dose(mg):  2.5mg  qd, 5mg  T,TH,SAT Regimen: hold 1 dose, then 2.5 mg daily, 5mg  tues, sat  Provider: Flora Ratz Repeat testing in: 2 weeks Dose has been reviewed with patient or caretaker during this visit. Reviewed by: Celso Sickle  Anticoagulation Visit Questionnaire Coumadin dose missed/changed:  No Abnormal Bleeding Symptoms:  No  Any diet changes including alcohol intake, vegetables or greens since the last visit:  No Any illnesses or hospitalizations since the last visit:  No Any signs of clotting since the last visit (including chest discomfort, dizziness, shortness of breath, arm tingling, slurred speech, swelling or redness in leg):  No  MEDICATIONS SPIRIVA HANDIHALER 18 MCG CAPS (TIOTROPIUM BROMIDE MONOHYDRATE) once daily AMIODARONE HCL 200 MG TABS (AMIODARONE HCL) 1 tab daily ADVAIR DISKUS 250-50 MCG/DOSE MISC (FLUTICASONE-SALMETEROL) 1 puff two times a day * OXYGEN 2L at bedtime and as needed during the day MAGNESIUM OXIDE 400 MG TABS (MAGNESIUM OXIDE) Take 1 tablet by mouth three times a day METOPROLOL TARTRATE 25 MG TABS (METOPROLOL TARTRATE) Take 1 tablet by mouth two times a day RANITIDINE HCL 150 MG TABS (RANITIDINE HCL) Take 1 tablet by mouth once a day XOPENEX 0.63 MG/3ML NEBU (LEVALBUTEROL HCL) One inhalation as needed. COUMADIN 2 MG TABS (WARFARIN SODIUM) as directed FUROSEMIDE 40 MG TABS (FUROSEMIDE) Take 2 tablets by mouth in AM and 1 tablet by mouth in PM COUMADIN 5 MG TABS (WARFARIN SODIUM) as directed COUMADIN 6 MG TABS (WARFARIN SODIUM) as directed VENTOLIN HFA 108 (90 BASE) MCG/ACT AERS (ALBUTEROL SULFATE) 1-2 puffs every 6 hours as needed SYNTHROID 25 MCG TABS (LEVOTHYROXINE SODIUM) one tablet once daily LIPITOR 20 MG TABS (ATORVASTATIN CALCIUM) Take one tablet by mouth daily. TRICOR 48 MG TABS (FENOFIBRATE) Take one tablet by  mouth once daily.     Laboratory Results   Blood Tests   Date/Time Received: June 10, 2010 12:00 PM Date/Time Reported: June 10, 2010 12:00 PM  PT: 47.0 s   (Normal Range: 10.6-13.4)  INR: 3.9   (Normal Range: 0.88-1.12   Therap INR: 2.0-3.5)

## 2010-06-17 NOTE — Progress Notes (Signed)
Summary: O2 change  Phone Note Outgoing Call   Call placed by: Aundra Millet, RN Call placed to: Cherlynn June (daughter-in-law) Summary of Call: Spoke to pt's daughter-in-law Mikal Wisman), informed her to have pt on O2 at all times, and let her know that pt's SaO2 Friday in clinic was 84% at rest. Selena Batten states she will have pt do this. She also requested refill of metoprolol, told her I will send rx today. Initial call taken by: Lanny Hurst RN,  June 07, 2010 10:00 AM    New/Updated Medications: METOPROLOL TARTRATE 25 MG TABS (METOPROLOL TARTRATE) Take 1 tablet by mouth two times a day Prescriptions: METOPROLOL TARTRATE 25 MG TABS (METOPROLOL TARTRATE) Take 1 tablet by mouth two times a day  #60 x 6   Entered by:   Lanny Hurst RN   Authorized by:   Marca Ancona, MD   Signed by:   Lanny Hurst RN on 06/07/2010   Method used:   Electronically to        [facility] (retail)       [address]       [place], [place]  [address]       Ph: [phone]       Fax: [phone]   RxID:   [id number]

## 2010-06-17 NOTE — Progress Notes (Signed)
Summary: RX  Phone Note Call from Patient Call back at [phone]   Caller: Selena Batten (DIL) Call For: gollan Summary of Call: Tricor and Lipitor is too expensive.  Is there something else that the pt can take?  The generic is too expensive. Initial call taken by: Harlon Flor,  June 07, 2010 4:20 PM     Appended Document: RX would stay on fenofibrate but can switch from Lipitor to simvastatin 40 mg daily.   Appended Document: RX Spoke to [employer], notified her that pt needs to stay on fenofibrate and that we can switch her to simvastatin 40mg  daily. Per pt, she does not want to stay on fenofibrate because it is too expensive and that she doesn't want to start on simvastatin due to seeing something on the news that statin medications can harm you. Pt wants to talk to you about this on her 3/9 visit before starting any medications.

## 2010-06-18 ENCOUNTER — Other Ambulatory Visit: Payer: Self-pay | Admitting: Cardiology

## 2010-06-18 ENCOUNTER — Encounter: Payer: Self-pay | Admitting: Cardiology

## 2010-06-18 ENCOUNTER — Ambulatory Visit (INDEPENDENT_AMBULATORY_CARE_PROVIDER_SITE_OTHER): Payer: Medicare Other | Admitting: Cardiology

## 2010-06-18 DIAGNOSIS — I4891 Unspecified atrial fibrillation: Secondary | ICD-10-CM

## 2010-06-18 DIAGNOSIS — I5022 Chronic systolic (congestive) heart failure: Secondary | ICD-10-CM

## 2010-06-19 LAB — BASIC METABOLIC PANEL
BUN: 56 mg/dL — ABNORMAL HIGH (ref 6–23)
Calcium: 8.9 mg/dL (ref 8.4–10.5)
Creat: 2.65 mg/dL — ABNORMAL HIGH (ref 0.40–1.20)
Glucose, Bld: 122 mg/dL — ABNORMAL HIGH (ref 70–99)

## 2010-06-19 LAB — BRAIN NATRIURETIC PEPTIDE: Brain Natriuretic Peptide: 194.2 pg/mL — ABNORMAL HIGH (ref 0.0–100.0)

## 2010-06-20 ENCOUNTER — Encounter: Payer: Self-pay | Admitting: Cardiology

## 2010-06-22 LAB — CONVERTED CEMR LAB
Chloride: 98 meq/L (ref 96–112)
Potassium: 4.4 meq/L (ref 3.5–5.3)
Pro B Natriuretic peptide (BNP): 194.2 pg/mL — ABNORMAL HIGH (ref 0.0–100.0)
Sodium: 144 meq/L (ref 135–145)

## 2010-06-22 NOTE — Assessment & Plan Note (Signed)
Summary: 3 month f/u JRT   Vital Signs:  Patient profile:   77 year old female Height:      63 inches Weight:      217.50 pounds BMI:     38.67 Temp:     98.0 degrees F oral Pulse rate:   64 / minute Pulse rhythm:   regular BP sitting:   120 / 72  (left arm) Cuff size:   regular  Vitals Entered By: Benny Lennert CMA Duncan Dull) (June 14, 2010 12:02 PM)  History of Present Illness: Chief complaint 3 month follow up  1 pound weight gain since last OV at Cardiology:  77 yo with history of COPD, atrial fibrillation, diastolic CHF, and CKD, hypothyroidism presents for followup.    For AF on amiodarone, Coumadin, in sinus rhythm.   Dyspnea has improved with some subjective diuresis with incerased Lasix. She is using her walker.  No orthopnea.  No chest pain.  She is using her oxygen at night but not during the day--instructed to use 02 all the time by Cardiology, but she has not done so and does not have travel 02.Marland Kitchen   COPD: on advair and spiriva, oxygen. Feels as if her dyspnea has improved, and feels dramatically better with 02 on.  CKD: is followed by Dr. Arliss Journey Labs (8/10): K 4.2, creatinine 2.8 Labs (11/10): creatinine 2.39, TGs 558, HDL 23 Labs (4/11): creatinine 2.05, K 4.4, LFTs normal, TGs 406, HDL 23 Labs (12/11): creatinine 2.5, K 3.9, LFTs normal, HCT 31.3 Labs (2/12): creatinine 2.55, K 4.5, TSH 9.9, BNP 336  Hypothyroid: the patient has yet to fill and take her Synthroid. Has been sent in by several different physicians, but she has yet to comply.   Clinical Review Panels:  Immunizations   Last Pneumovax:  Pneumovax (08/03/2009)  Lipid Management   Cholesterol:  260 (07/23/2009)   LDL (bad choesterol):  See Comment mg/dL (16/01/9603)   HDL (good cholesterol):  23 (07/23/2009)  Diabetes Management   HgBA1C:  7.9 (09/03/2008)   Creatinine:  2.55 (06/04/2010)   Last Pneumovax:  Pneumovax (08/03/2009)  CBC   WBC:  11.9 (03/15/2010)   RBC:  3.53  (03/15/2010)   Hgb:  10.3 (03/15/2010)   Hct:  31.3 (03/15/2010)   Platelets:  262.0 (03/15/2010)   MCV  88.6 (03/15/2010)   MCHC  32.8 (03/15/2010)   RDW  19.8 (03/15/2010)   PMN:  80.2 (03/15/2010)   Lymphs:  13.9 (03/15/2010)   Monos:  4.9 (03/15/2010)   Eosinophils:  0.6 (03/15/2010)   Basophil:  0.4 (03/15/2010)  Complete Metabolic Panel   Glucose:  118 (06/04/2010)   Sodium:  141 (06/04/2010)   Potassium:  4.2 (06/04/2010)   Chloride:  98 (06/04/2010)   CO2:  30 (06/04/2010)   BUN:  48 (06/04/2010)   Creatinine:  2.55 (06/04/2010)   Albumin:  3.6 (03/15/2010)   Total Protein:  6.5 (03/15/2010)   Calcium:  9.2 (06/04/2010)   Total Bili:  0.3 (03/15/2010)   Alk Phos:  52 (03/15/2010)   SGPT (ALT):  21 (03/15/2010)   SGOT (AST):  18 (03/15/2010)   Allergies: 1)  ! Sulfa  Past History:  Past medical, surgical, family and social histories (including risk factors) reviewed, and no changes noted (except as noted below).  Past Medical History: Reviewed history from 03/22/2010 and no changes required. 1. Type II diabetes 2. Obesity. 3. Moderate COPD.  PFTs (12/10): FVC 67%, FEV1 58%, ratio 60%, TLC 95%, RV 138%, DLCO 43% (  moderate obstructive defect).  4. Diastolic congestive heart failure.  Most recent echo April 2010 showed mild LVH, EF 55-60%, mild left atrial enlargement, and mild mitral regurgitation. 5. Atrial fibrillation. 07/2008,  failed cardioversion with TEE guidance in May 2010.  Patient went back into NSR by 8/10 with amiodarone.  6. Chronic kidney disease, Cr 2.4 when last checked.  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  7. History of pneumonia with ARDS in 2005, MRSA PNA with prolonged hospitalization in [facility] in 11/11.  8. 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 Possible obstructive sleep apnea.  When the patient was sedated for TEE in May 2010, she did develop some upper airway obstruction. 10. Adenosine myoview  (5/10):  Normal LV EF and no scar or ischemia (normal stress). 11. Anemia of chronic disease 12. Hypothyroidism 13. Gout   Primary = Dr. Patsy Lager Cardiology = Dr. Shirlee Latch Pulmonology = Dr. Delford Field Nephrology = Dr. Arrie Aran  Past Surgical History: h/o tracheostomy ARDS / ICU admission, Trach (2005) Resp Failure, MRSA PNA (02/2010) Microperforation of jejunem, ICU admission, 08/2008  Family History: Reviewed history from 11/11/2008 and no changes required. No FH premature CAD emphysema: mother asthma: mother  Social History: Reviewed history from 10/07/2008 and no changes required. Married with children. Daughter, NP, is involved Tobacco Use - Former. quit 1970, started at age 27.  1 ppd pt is retired.  previously worked as a Production designer, theatre/television/film for [employer]. Regular Exercise - yes Drug Use - no Alcohol Use - no  Review of Systems       Otherwise, the pertinent positives and negatives are listed above and in the HPI, otherwise a full review of systems has been reviewed and is negative unless noted positive.  General:  Denies chills and fever. CV:  See HPI. Resp:  See HPI.  Physical Exam  General:  Well-developed,well-nourished,in no acute distress; alert,appropriate and cooperative throughout examination. obese. Head:  Normocephalic and atraumatic without obvious abnormalities. Ears:  External ear exam shows no significant lesions or deformities.  Otoscopic examination reveals clear canals, tympanic membranes are intact bilaterally without bulging, retraction, inflammation or discharge. Hearing is grossly normal bilaterally. Nose:  External nasal examination shows no deformity or inflammation. Nasal mucosa are pink and moist without lesions or exudates. Mouth:  pharynx pink and moist, no erythema, no exudates, and no posterior lymphoid hypertrophy.   Neck:  No deformities, masses, or tenderness noted. Chest Wall:  No deformities, masses, or tenderness noted. Lungs:   normal respiratory effort, no intercostal retractions, and no accessory muscle use.  decreased BS, no focal crackles and no wheezing Heart:  RRR, no m/g/r on my exam Extremities:  tr LE edema, no c/c Neurologic:  alert & oriented X3.  walks with walker Skin:  no rash Cervical Nodes:  No lymphadenopathy noted Psych:  Cognition and judgment appear intact. Alert and cooperative with normal attention span and concentration. No apparent delusions, illusions, hallucinations   Impression & Recommendations:  Problem # 1:  COPD (ICD-496) Assessment Deteriorated Not wearing 02 all the time. Compliant with inhalers.  With walking in the office today, sats drop to 80% on RA, increase to 90% with addition of 2 L 02 and patient feels subjectively better. No portable oxygen now, will arrange for portable oxygen tank in addition to home oxygen, to wear 02 at all times.  Her updated medication list for this problem includes:    Spiriva Handihaler 18 Mcg Caps (Tiotropium bromide monohydrate) ..... Once daily    Advair  Diskus 250-50 Mcg/dose Misc (Fluticasone-salmeterol) .Marland Kitchen... 1 puff two times a day    Xopenex 0.63 Mg/69ml Nebu (Levalbuterol hcl) ..... One inhalation as needed.    Ventolin Hfa 108 (90 Base) Mcg/act Aers (Albuterol sulfate) .Marland Kitchen... 1-2 puffs every 6 hours as needed  Orders: Home Health Referral (Home Health) Pulse Oximetry, Ambulatory (38756)  Problem # 2:  HYPOTHYROIDISM (ICD-244.9) Noncompliant, has never filled synthroid. Reviewed in detail ramifications.  Sent in again to pharmacy, she says she will pick up and begin. Needs f/u TSH in 1 mo  Her updated medication list for this problem includes:    Levothyroxine Sodium 25 Mcg Tabs (Levothyroxine sodium) .Marland Kitchen... 1 by mouth daily  Problem # 3:  ATRIAL FIBRILLATION (ICD-427.31) Assessment: Improved controlled  Her updated medication list for this problem includes:    Amiodarone Hcl 200 Mg Tabs (Amiodarone hcl) .Marland Kitchen... 1 tab daily     Metoprolol Tartrate 25 Mg Tabs (Metoprolol tartrate) .Marland Kitchen... Take 1 tablet by mouth two times a day    Coumadin 2 Mg Tabs (Warfarin sodium) .Marland Kitchen... As directed    Coumadin 5 Mg Tabs (Warfarin sodium) .Marland Kitchen... As directed    Coumadin 6 Mg Tabs (Warfarin sodium) .Marland Kitchen... As directed  Problem # 4:  HYPERLIPIDEMIA-MIXED (ICD-272.4) Assessment: Improved  The following medications were removed from the medication list:    Lipitor 20 Mg Tabs (Atorvastatin calcium) .Marland Kitchen... Take one tablet by mouth daily.    Tricor 48 Mg Tabs (Fenofibrate) .Marland Kitchen... Take one tablet by mouth once daily.  Labs Reviewed: SGOT: 18 (03/15/2010)   SGPT: 21 (03/15/2010)   HDL:23 (07/23/2009), 23 (02/10/2009)  LDL:See Comment mg/dL (43/32/9518), NOT CALC mg/dL (84/16/6063)  KZSW:109 (07/23/2009), 249 (02/10/2009)  Trig:406 (07/23/2009), 558 (02/10/2009)  Problem # 5:  RENAL FAILURE, ACUTE (ICD-584.9) Assessment: Unchanged CKD, stable  Problem # 6:  HYPERTENSION, UNSPECIFIED (ICD-401.9) Assessment: Improved  Her updated medication list for this problem includes:    Metoprolol Tartrate 25 Mg Tabs (Metoprolol tartrate) .Marland Kitchen... Take 1 tablet by mouth two times a day    Furosemide 40 Mg Tabs (Furosemide) .Marland Kitchen... Take 2 tablets by mouth in am and 1 tablet by mouth in pm  BP today: 120/72 Prior BP: 146/69 (06/04/2010)  Labs Reviewed: K+: 4.2 (06/04/2010) Creat: : 2.55 (06/04/2010)   Chol: 260 (07/23/2009)   HDL: 23 (07/23/2009)   LDL: See Comment mg/dL (32/35/5732)   TG: 202 (07/23/2009)  Problem # 7:  DIASTOLIC HEART FAILURE, CHRONIC (ICD-428.32) Assessment: Improved no crackles, subjectively "less swelling" but 1 pound weight gain  Her updated medication list for this problem includes:    Metoprolol Tartrate 25 Mg Tabs (Metoprolol tartrate) .Marland Kitchen... Take 1 tablet by mouth two times a day    Coumadin 2 Mg Tabs (Warfarin sodium) .Marland Kitchen... As directed    Furosemide 40 Mg Tabs (Furosemide) .Marland Kitchen... Take 2 tablets by mouth in am and 1 tablet by  mouth in pm    Coumadin 5 Mg Tabs (Warfarin sodium) .Marland Kitchen... As directed    Coumadin 6 Mg Tabs (Warfarin sodium) .Marland Kitchen... As directed  Complete Medication List: 1)  Spiriva Handihaler 18 Mcg Caps (Tiotropium bromide monohydrate) .... Once daily 2)  Amiodarone Hcl 200 Mg Tabs (Amiodarone hcl) .Marland Kitchen.. 1 tab daily 3)  Advair Diskus 250-50 Mcg/dose Misc (Fluticasone-salmeterol) .Marland Kitchen.. 1 puff two times a day 4)  Oxygen  .... 2l at bedtime and as needed during the day 5)  Magnesium Oxide 400 Mg Tabs (Magnesium oxide) .... Take 1 tablet by mouth three times a day 6)  Metoprolol Tartrate 25 Mg Tabs (Metoprolol tartrate) .... Take 1 tablet by mouth two times a day 7)  Ranitidine Hcl 150 Mg Tabs (Ranitidine hcl) .... Take 1 tablet by mouth once a day 8)  Xopenex 0.63 Mg/37ml Nebu (Levalbuterol hcl) .... One inhalation as needed. 9)  Coumadin 2 Mg Tabs (Warfarin sodium) .... As directed 10)  Furosemide 40 Mg Tabs (Furosemide) .... Take 2 tablets by mouth in am and 1 tablet by mouth in pm 11)  Coumadin 5 Mg Tabs (Warfarin sodium) .... As directed 12)  Coumadin 6 Mg Tabs (Warfarin sodium) .... As directed 13)  Ventolin Hfa 108 (90 Base) Mcg/act Aers (Albuterol sulfate) .Marland Kitchen.. 1-2 puffs every 6 hours as needed 14)  Levothyroxine Sodium 25 Mcg Tabs (Levothyroxine sodium) .Marland Kitchen.. 1 by mouth daily  Patient Instructions: 1)  f/u 6 months 2)  should get thyroid rechecked in a month (it appears Dr. Shirlee Latch already ordered) 3)  Referral Appointment Information 4)  Day/Date: 5)  Time: 6)  Place/MD: 7)  Address: 8)  Phone/Fax: 9)  Patient given appointment information. Information/Orders faxed/mailed.  10)  (FOR OXYGEN) Prescriptions: LEVOTHYROXINE SODIUM 25 MCG TABS (LEVOTHYROXINE SODIUM) 1 by mouth daily  #30 x 3   Entered and Authorized by:   Hannah Beat MD   Signed by:   Hannah Beat MD on 06/14/2010   Method used:   Electronically to        [facility] (retail)       [address]        [place], [place]  [address]       Ph: [phone]       Fax: [phone]   RxID:   [id number]    Orders Added: 1)  Home Health Referral [Home Health] 2)  Est. Patient Level V [59563] 3)  Pulse Oximetry, Ambulatory [94761]    Current Allergies (reviewed today): ! SULFA    Ambulatory Pulse Oximetry:  Resting HR_68_   O2 Sat_88_  Walk HR_70_   O2 Sat_80_  Room Air Walk HR_74_   O2 Sat_84_  Walking with no oxygen Walk HR_80   O2 Sat_90_   Patient was walking on 2 liters of oxygen  _Y_Test completed without difficulty __Test stopped due to: results achieved

## 2010-06-22 NOTE — Assessment & Plan Note (Signed)
Summary: F2W/AMD   Visit Type:  Follow-up Referring Provider:  Karleen Hampshire Copland/ Marca Ancona Primary Provider:  Hannah Beat MD  CC:  3 week f/u. "doing well".  History of Present Illness: 77 yo with history of COPD, atrial fibrillation, diastolic CHF, and CKD presents for followup.  She remains in sinus rhythm on amiodarone. Since I last saw her, she is doing better.  Weight is stable but she says that her lower extremity edema is improved.  She is breathing better: she can walk around her house now without exertional dyspnea.  She is using her oxygen most of the time at home but does not have a portable tank so does not use it when she goes out.  Her portable oxygen tank is supposed to arrive Monday.  She notes a marked improvement in her symptoms when she uses oxygen.  I did an echocardiogram on her which showed normal LV systolic function, EF 55-60% with no evidence for pulmonary HTN.  She has now started Synthroid for hypothyroidism.  She has not started Lipitor or Tricor (Lipitor is too expensive and her insurance does not cover).    Labs (8/10): K 4.2, creatinine 2.8 Labs (11/10): creatinine 2.39, TGs 558, HDL 23 Labs (4/11): creatinine 2.05, K 4.4, LFTs normal, TGs 406, HDL 23 Labs (12/11): creatinine 2.5, K 3.9, LFTs normal, HCT 31.3 Labs (2/12): creatinine 2.55, K 4.5, TSH 9.9, BNP 33   Current Medications (verified): 1)  Spiriva Handihaler 18 Mcg Caps (Tiotropium Bromide Monohydrate) .... Once Daily 2)  Amiodarone Hcl 200 Mg Tabs (Amiodarone Hcl) .Marland Kitchen.. 1 Tab Daily 3)  Advair Diskus 250-50 Mcg/dose Misc (Fluticasone-Salmeterol) .Marland Kitchen.. 1 Puff Two Times A Day 4)  Oxygen .... 2l At Bedtime and As Needed During The Day 5)  Magnesium Oxide 400 Mg Tabs (Magnesium Oxide) .... Take 1 Tablet By Mouth Three Times A Day 6)  Metoprolol Tartrate 25 Mg Tabs (Metoprolol Tartrate) .... Take 1 Tablet By Mouth Two Times A Day 7)  Ranitidine Hcl 150 Mg Tabs (Ranitidine Hcl) .... Take 1 Tablet By  Mouth Once A Day 8)  Xopenex 0.63 Mg/55ml Nebu (Levalbuterol Hcl) .... One Inhalation As Needed. 9)  Coumadin 2 Mg Tabs (Warfarin Sodium) .... As Directed 10)  Furosemide 40 Mg Tabs (Furosemide) .... Take 2 Tablets By Mouth in Am and 1 Tablet By Mouth in Pm 11)  Coumadin 5 Mg Tabs (Warfarin Sodium) .... As Directed 12)  Coumadin 6 Mg Tabs (Warfarin Sodium) .... As Directed 13)  Ventolin Hfa 108 (90 Base) Mcg/act Aers (Albuterol Sulfate) .Marland Kitchen.. 1-2 Puffs Every 6 Hours As Needed 14)  Levothyroxine Sodium 25 Mcg Tabs (Levothyroxine Sodium) .Marland Kitchen.. 1 By Mouth Daily  Allergies (verified): 1)  ! Sulfa  Past History:  Past Surgical History: Last updated: 06/14/2010 h/o tracheostomy ARDS / ICU admission, Trach (2005) Resp Failure, MRSA PNA (02/2010) Microperforation of jejunem, ICU admission, 08/2008  Family History: Last updated: 11/11/2008 No FH premature CAD emphysema: mother asthma: mother  Social History: Last updated: 10/07/2008 Married with children. Daughter, NP, is involved Tobacco Use - Former. quit 1970, started at age 27.  1 ppd pt is retired.  previously worked as a Production designer, theatre/television/film for [employer]. Regular Exercise - yes Drug Use - no Alcohol Use - no  Risk Factors: Exercise: yes (08/20/2008)  Risk Factors: Smoking Status: quit > 6 months (03/25/2010) Packs/Day: 1.0 (03/25/2010)  Past Medical History: 1. Type II diabetes 2. Obesity. 3. Moderate COPD.  PFTs (12/10): FVC 67%, FEV1 58%, ratio  60%, TLC 95%, RV 138%, DLCO 43% (moderate obstructive defect).  She is on home oxygen that should be worn at all times.  4. Diastolic congestive heart failure.  Echo (3/12): EF 55-60%, mild LV hypertrophy, grade I diastolic dysfunction, mild left atrial enlargement, normal pulmonary artery pressure.  5. Atrial fibrillation. 07/2008,  failed cardioversion with TEE guidance in May 2010.  Patient went back into NSR by 8/10 with amiodarone.  6. Chronic kidney disease, Cr 2.4 when last  checked.  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  7. History of pneumonia with ARDS in 2005, MRSA PNA with prolonged hospitalization in [facility] in 11/11.  8. 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 Possible obstructive sleep apnea.  When the patient was sedated for TEE in May 2010, she did develop some upper airway obstruction. 10. Adenosine myoview (5/10):  Normal LV EF and no scar or ischemia (normal stress). 11. Anemia of chronic disease 12. Hypothyroidism 13. Gout   Primary = Dr. Patsy Lager Cardiology = Dr. Shirlee Latch Pulmonology = Dr. Delford Field Nephrology = Dr. Arrie Aran  Family History: Reviewed history from 11/11/2008 and no changes required. No FH premature CAD emphysema: mother asthma: mother  Social History: Reviewed history from 10/07/2008 and no changes required. Married with children. Daughter, NP, is involved Tobacco Use - Former. quit 1970, started at age 57.  1 ppd pt is retired.  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   Vital Signs:  Patient profile:   77 year old female Height:      63 inches Weight:      216.25 pounds BMI:     38.45 Pulse rate:   56 / minute BP sitting:   122 / 70  (left arm) Cuff size:   large  Vitals Entered By: Lysbeth Galas CMA (June 18, 2010 11:06 AM)  Physical Exam  General:  Well developed, well nourished, in no acute distress.  Obese.  Neck:  Neck supple, no JVD. No masses, thyromegaly or abnormal cervical nodes. Lungs:  Clear bilaterally to auscultation and percussion. Heart:  Non-displaced PMI, chest non-tender; regular rate and rhythm, S1, S2 without murmurs, rubs or gallops. Carotid upstroke normal, no bruit. 2+ edema to knees bilaterally.  Abdomen:  Bowel sounds positive; abdomen soft and non-tender without masses, organomegaly, or hernias  noted. No hepatosplenomegaly. Extremities:  No clubbing or cyanosis. Neurologic:  Alert and oriented x 3. Psych:  Normal affect.   Impression & Recommendations:  Problem # 1:  DIASTOLIC HEART FAILURE, CHRONIC (ICD-428.32) Patient is breathing better today and JVP is not elevated.  She continues to have significant lower extremity edema which I suspect is partially due to venous insufficiency.  I will have her continue current dose of Lasix, but will get a BMET and BNP today to make sure that K and creatinine are stable.    Problem # 2:  HYPOTHYROIDISM (ICD-244.9) She is now taking Synthroid.   Problem # 3:  HYPERLIPIDEMIA-MIXED (ICD-272.4) Lipitor is too expensive.  I will have her start simvastatin 40 mg daily + Tricor 48 mg daily.   Problem # 4:  ATRIAL FIBRILLATION (ICD-427.31) Patient is in NSR on amiodarone.  Have been monitoring PFTs/LFTs/TFTs.  She gets yearly eye exams.  She is on coumadin.   Problem # 5:  COPD (ICD-496) Patient feels  much better wearing oxygen.  She will wear it at all times when she gets her portable tank.   Other Orders: T-Basic Metabolic Panel [phone]) T-BNP  (B Natriuretic Peptide) [phone])  Patient Instructions: 1)  Your physician recommends that you schedule a follow-up appointment in: 3 months 2)  Your physician recommends that you return for lab work in: 2 months (Lipid/LFT) 3)  Your physician has recommended you make the following change in your medication: START Fenofibrate (Tricor) 48mg  once daily. START Simvastatin (Zocor) 40mg  once daily. Prescriptions: TRICOR 48 MG TABS (FENOFIBRATE) Take one tablet by mouth once daily.  #30 x 6   Entered by:   Lanny Hurst RN   Authorized by:   Marca Ancona, MD   Signed by:   Lanny Hurst RN on 06/18/2010   Method used:   Electronically to        [facility] (retail)       [address]       [place], [place]  [address]       Ph: [phone]       Fax:  [phone]   RxID:   [id number] ZOCOR 40 MG TABS (SIMVASTATIN) Take one tablet by mouth once daily.  #30 x 6   Entered by:   Lanny Hurst RN   Authorized by:   Marca Ancona, MD   Signed by:   Lanny Hurst RN on 06/18/2010   Method used:   Electronically to        [facility] (retail)       [address]       [place], [place]  [address]       Ph: [phone]       Fax: [phone]   RxID:   [id number]

## 2010-06-24 ENCOUNTER — Encounter: Payer: Self-pay | Admitting: Family Medicine

## 2010-06-24 ENCOUNTER — Ambulatory Visit (INDEPENDENT_AMBULATORY_CARE_PROVIDER_SITE_OTHER): Payer: Medicare Other

## 2010-06-24 DIAGNOSIS — Z79899 Other long term (current) drug therapy: Secondary | ICD-10-CM

## 2010-06-24 DIAGNOSIS — I4891 Unspecified atrial fibrillation: Secondary | ICD-10-CM

## 2010-06-24 DIAGNOSIS — Z5181 Encounter for therapeutic drug level monitoring: Secondary | ICD-10-CM

## 2010-06-24 LAB — CONVERTED CEMR LAB
INR: 3
Prothrombin Time: 36.6 s

## 2010-06-29 NOTE — Medication Information (Signed)
Summary: protime/rbh  Anticoagulant Therapy  Managed by: Inactive Referring MD: D.Mclean PCP: Hannah Beat MD Supervising MD: Mariah Milling Indication 1: Atrial fibrillation Lab Used: [facility] [place] Site: [place] [facility] 36.6 INR RANGE 2.0-3.0           Allergies: 1)  ! Sulfa  Anticoagulation Management History:      Positive risk factors for bleeding include an age of 75 years or older and presence of serious comorbidities.  The bleeding index is 'intermediate risk'.  Positive CHADS2 values include History of CHF, History of HTN, Age > 19 years old, and History of Diabetes.  Her last INR was 3.9 and today's INR is 3.0.  Prothrombin time is 36.6.  Anticoagulation responsible provider: Gollan.  Exp: 01/2011.    Anticoagulation Management Assessment/Plan:      The patient's current anticoagulation dose is Coumadin 2 mg tabs: as directed, Coumadin 5 mg tabs: as directed, Coumadin 6 mg tabs: as directed.  The target INR is 2.0-3.0.  The next INR is due 2 weeks.  Anticoagulation instructions were given to patient.  Results were reviewed/authorized by Inactive.         Prior Anticoagulation Instructions: INR 1.2  Take 1 tablet today, then resume same dosage 1/2 tablet daily except 1 tablet on Mondays.  Recheck in 10 days-2 weeks.    Laboratory Results   Blood Tests   Date/Time Recieved: June 24, 2010 11:32 AM  Date/Time Reported: June 24, 2010 11:32 AM   PT: 36.6 s   (Normal Range: 10.6-13.4)  INR: 3.0   (Normal Range: 0.88-1.12   Therap INR: 2.0-3.5)      ANTICOAGULATION RECORD PREVIOUS REGIMEN & LAB RESULTS Anticoagulation Diagnosis:  Atrial fibrillation on  09/22/2008 Previous INR Goal Range:  2.0-3.0 on  10/16/2008 Previous INR:  3.9 on  06/10/2010 Previous Coumadin Dose(mg):   2.5mg  qd, 5mg  T,TH,SAT on  06/10/2010 Previous Regimen:  hold 1 dose, then 2.5 mg daily, 5mg  tues, sat on  06/10/2010 Previous Coagulation Comments:  . on   11/21/2008  NEW REGIMEN & LAB RESULTS Current INR: 3.0 Regimen: 2.5mg  qd, 5mg  SAT  Provider: Elfrieda Espino      Repeat testing in: 2 weeks MEDICATIONS SPIRIVA HANDIHALER 18 MCG CAPS (TIOTROPIUM BROMIDE MONOHYDRATE) once daily AMIODARONE HCL 200 MG TABS (AMIODARONE HCL) 1 tab daily ADVAIR DISKUS 250-50 MCG/DOSE MISC (FLUTICASONE-SALMETEROL) 1 puff two times a day * OXYGEN 2L at bedtime and as needed during the day MAGNESIUM OXIDE 400 MG TABS (MAGNESIUM OXIDE) Take 1 tablet by mouth three times a day METOPROLOL TARTRATE 25 MG TABS (METOPROLOL TARTRATE) Take 1 tablet by mouth two times a day RANITIDINE HCL 150 MG TABS (RANITIDINE HCL) Take 1 tablet by mouth once a day XOPENEX 0.63 MG/3ML NEBU (LEVALBUTEROL HCL) One inhalation as needed. COUMADIN 2 MG TABS (WARFARIN SODIUM) as directed FUROSEMIDE 40 MG TABS (FUROSEMIDE) Take 2 tablets by mouth in AM and 1 tablet by mouth in PM COUMADIN 5 MG TABS (WARFARIN SODIUM) as directed COUMADIN 6 MG TABS (WARFARIN SODIUM) as directed VENTOLIN HFA 108 (90 BASE) MCG/ACT AERS (ALBUTEROL SULFATE) 1-2 puffs every 6 hours as needed LEVOTHYROXINE SODIUM 25 MCG TABS (LEVOTHYROXINE SODIUM) 1 by mouth daily ZOCOR 40 MG TABS (SIMVASTATIN) Take one tablet by mouth once daily. TRICOR 48 MG TABS (FENOFIBRATE) Take one tablet by mouth once daily.  Dose has been reviewed with patient or caretaker during this visit.  Reviewed by: Allison Quarry  Anticoagulation Visit Questionnaire      Coumadin dose  missed/changed:  No      Abnormal Bleeding Symptoms:  No Any diet changes including alcohol intake, vegetables or greens since the last visit:  No Any illnesses or hospitalizations since the last visit:  No Any signs of clotting since the last visit (including chest discomfort, dizziness, shortness of breath, arm tingling, slurred speech, swelling or redness in leg):  No

## 2010-07-06 ENCOUNTER — Other Ambulatory Visit: Payer: Medicare Other | Admitting: *Deleted

## 2010-07-08 ENCOUNTER — Ambulatory Visit: Payer: Medicare Other

## 2010-07-08 ENCOUNTER — Telehealth: Payer: Self-pay | Admitting: Radiology

## 2010-07-08 DIAGNOSIS — Z7901 Long term (current) use of anticoagulants: Secondary | ICD-10-CM

## 2010-07-08 DIAGNOSIS — I4891 Unspecified atrial fibrillation: Secondary | ICD-10-CM

## 2010-07-08 DIAGNOSIS — Z5181 Encounter for therapeutic drug level monitoring: Secondary | ICD-10-CM

## 2010-07-08 NOTE — Telephone Encounter (Signed)
Please sign and close standing order for INR  

## 2010-07-12 ENCOUNTER — Ambulatory Visit (INDEPENDENT_AMBULATORY_CARE_PROVIDER_SITE_OTHER): Payer: Medicare Other | Admitting: Family Medicine

## 2010-07-12 ENCOUNTER — Telehealth: Payer: Self-pay | Admitting: Radiology

## 2010-07-12 DIAGNOSIS — I4891 Unspecified atrial fibrillation: Secondary | ICD-10-CM

## 2010-07-12 DIAGNOSIS — Z7901 Long term (current) use of anticoagulants: Secondary | ICD-10-CM

## 2010-07-12 DIAGNOSIS — Z5181 Encounter for therapeutic drug level monitoring: Secondary | ICD-10-CM

## 2010-07-12 MED ORDER — PHYTONADIONE 5 MG PO TABS: 5.0000 mg | ORAL_TABLET | Freq: Once | ORAL | Status: AC

## 2010-07-12 NOTE — Telephone Encounter (Signed)
Critical INR 12.9

## 2010-07-12 NOTE — Patient Instructions (Addendum)
Hold today's dose, we will call you this afternoon or tomorrow morning with lab results and further instructions.  Talked to patient's son today 4.3.12, told him to pick up Vit K from pharmacy, to hold Coumadin until we check her INR on Thursday, [place] [id number]

## 2010-07-12 NOTE — Progress Notes (Signed)
Agree with POC

## 2010-07-12 NOTE — Progress Notes (Signed)
Critical INR received > 12  No bleeding, will call in Vit K, 5 mg Hold Warfarin and recheck PT/INR on Thursday.  Messages left on her home phone, son and daughter-in-laws voicemail.  Mills Koller to assist in return for INR recheck on Thursday.

## 2010-07-13 ENCOUNTER — Telehealth: Payer: Self-pay | Admitting: *Deleted

## 2010-07-13 DIAGNOSIS — J449 Chronic obstructive pulmonary disease, unspecified: Secondary | ICD-10-CM

## 2010-07-13 DIAGNOSIS — I5032 Chronic diastolic (congestive) heart failure: Secondary | ICD-10-CM

## 2010-07-13 DIAGNOSIS — I4891 Unspecified atrial fibrillation: Secondary | ICD-10-CM

## 2010-07-13 NOTE — Telephone Encounter (Signed)
Very reasonable.  Will order hospital bed.

## 2010-07-13 NOTE — Telephone Encounter (Signed)
Patient has been sleeping in a recliner for the past several months because she is unable to get into her bed. Her sister is asking of an order for a hospital [facility] because they feel that they could lower it enough to help her get in and out and also it will have rails to keep her from falling out.

## 2010-07-15 ENCOUNTER — Ambulatory Visit (INDEPENDENT_AMBULATORY_CARE_PROVIDER_SITE_OTHER): Payer: Medicare Other | Admitting: Family Medicine

## 2010-07-15 DIAGNOSIS — I4891 Unspecified atrial fibrillation: Secondary | ICD-10-CM

## 2010-07-15 DIAGNOSIS — Z7901 Long term (current) use of anticoagulants: Secondary | ICD-10-CM

## 2010-07-15 DIAGNOSIS — Z5181 Encounter for therapeutic drug level monitoring: Secondary | ICD-10-CM

## 2010-07-15 LAB — POCT INR: INR: 1.4

## 2010-07-15 NOTE — Patient Instructions (Signed)
2.5mg  daily, check next Tuesday while here seeing DR Copland

## 2010-07-19 LAB — GLUCOSE, CAPILLARY
Glucose-Capillary: 109 mg/dL — ABNORMAL HIGH (ref 70–99)
Glucose-Capillary: 119 mg/dL — ABNORMAL HIGH (ref 70–99)
Glucose-Capillary: 120 mg/dL — ABNORMAL HIGH (ref 70–99)
Glucose-Capillary: 125 mg/dL — ABNORMAL HIGH (ref 70–99)
Glucose-Capillary: 126 mg/dL — ABNORMAL HIGH (ref 70–99)
Glucose-Capillary: 126 mg/dL — ABNORMAL HIGH (ref 70–99)
Glucose-Capillary: 129 mg/dL — ABNORMAL HIGH (ref 70–99)
Glucose-Capillary: 129 mg/dL — ABNORMAL HIGH (ref 70–99)
Glucose-Capillary: 131 mg/dL — ABNORMAL HIGH (ref 70–99)
Glucose-Capillary: 132 mg/dL — ABNORMAL HIGH (ref 70–99)
Glucose-Capillary: 132 mg/dL — ABNORMAL HIGH (ref 70–99)
Glucose-Capillary: 145 mg/dL — ABNORMAL HIGH (ref 70–99)
Glucose-Capillary: 150 mg/dL — ABNORMAL HIGH (ref 70–99)
Glucose-Capillary: 156 mg/dL — ABNORMAL HIGH (ref 70–99)
Glucose-Capillary: 160 mg/dL — ABNORMAL HIGH (ref 70–99)
Glucose-Capillary: 165 mg/dL — ABNORMAL HIGH (ref 70–99)
Glucose-Capillary: 175 mg/dL — ABNORMAL HIGH (ref 70–99)
Glucose-Capillary: 177 mg/dL — ABNORMAL HIGH (ref 70–99)
Glucose-Capillary: 192 mg/dL — ABNORMAL HIGH (ref 70–99)
Glucose-Capillary: 199 mg/dL — ABNORMAL HIGH (ref 70–99)
Glucose-Capillary: 92 mg/dL (ref 70–99)

## 2010-07-19 LAB — URINALYSIS, MICROSCOPIC ONLY
Nitrite: NEGATIVE
Protein, ur: NEGATIVE mg/dL
Specific Gravity, Urine: 1.012 (ref 1.005–1.030)
Urobilinogen, UA: 0.2 mg/dL (ref 0.0–1.0)

## 2010-07-19 LAB — CBC
HCT: 27.3 % — ABNORMAL LOW (ref 36.0–46.0)
HCT: 28.6 % — ABNORMAL LOW (ref 36.0–46.0)
HCT: 30.5 % — ABNORMAL LOW (ref 36.0–46.0)
HCT: 31 % — ABNORMAL LOW (ref 36.0–46.0)
Hemoglobin: 8.5 g/dL — ABNORMAL LOW (ref 12.0–15.0)
Hemoglobin: 9.3 g/dL — ABNORMAL LOW (ref 12.0–15.0)
Hemoglobin: 9.3 g/dL — ABNORMAL LOW (ref 12.0–15.0)
MCHC: 33 g/dL (ref 30.0–36.0)
MCHC: 33.1 g/dL (ref 30.0–36.0)
MCHC: 33.4 g/dL (ref 30.0–36.0)
MCHC: 33.5 g/dL (ref 30.0–36.0)
MCHC: 33.6 g/dL (ref 30.0–36.0)
MCHC: 34.2 g/dL (ref 30.0–36.0)
MCHC: 34.4 g/dL (ref 30.0–36.0)
MCV: 83.3 fL (ref 78.0–100.0)
MCV: 84.7 fL (ref 78.0–100.0)
MCV: 84.8 fL (ref 78.0–100.0)
MCV: 84.9 fL (ref 78.0–100.0)
MCV: 85.9 fL (ref 78.0–100.0)
Platelets: 174 10*3/uL (ref 150–400)
Platelets: 181 10*3/uL (ref 150–400)
Platelets: 200 10*3/uL (ref 150–400)
Platelets: 235 10*3/uL (ref 150–400)
Platelets: 284 10*3/uL (ref 150–400)
RBC: 2.99 MIL/uL — ABNORMAL LOW (ref 3.87–5.11)
RBC: 3.31 MIL/uL — ABNORMAL LOW (ref 3.87–5.11)
RBC: 3.49 MIL/uL — ABNORMAL LOW (ref 3.87–5.11)
RDW: 15.6 % — ABNORMAL HIGH (ref 11.5–15.5)
RDW: 15.9 % — ABNORMAL HIGH (ref 11.5–15.5)
RDW: 16 % — ABNORMAL HIGH (ref 11.5–15.5)
RDW: 16 % — ABNORMAL HIGH (ref 11.5–15.5)
RDW: 16 % — ABNORMAL HIGH (ref 11.5–15.5)
WBC: 11.8 10*3/uL — ABNORMAL HIGH (ref 4.0–10.5)
WBC: 13.6 10*3/uL — ABNORMAL HIGH (ref 4.0–10.5)
WBC: 14.6 10*3/uL — ABNORMAL HIGH (ref 4.0–10.5)

## 2010-07-19 LAB — BASIC METABOLIC PANEL
BUN: 60 mg/dL — ABNORMAL HIGH (ref 6–23)
BUN: 62 mg/dL — ABNORMAL HIGH (ref 6–23)
BUN: 64 mg/dL — ABNORMAL HIGH (ref 6–23)
BUN: 73 mg/dL — ABNORMAL HIGH (ref 6–23)
CO2: 29 mEq/L (ref 19–32)
CO2: 33 mEq/L — ABNORMAL HIGH (ref 19–32)
CO2: 35 mEq/L — ABNORMAL HIGH (ref 19–32)
CO2: 37 mEq/L — ABNORMAL HIGH (ref 19–32)
Calcium: 9 mg/dL (ref 8.4–10.5)
Calcium: 9 mg/dL (ref 8.4–10.5)
Calcium: 9 mg/dL (ref 8.4–10.5)
Chloride: 101 mEq/L (ref 96–112)
Chloride: 96 mEq/L (ref 96–112)
Chloride: 97 mEq/L (ref 96–112)
Creatinine, Ser: 2.77 mg/dL — ABNORMAL HIGH (ref 0.4–1.2)
Creatinine, Ser: 2.8 mg/dL — ABNORMAL HIGH (ref 0.4–1.2)
Creatinine, Ser: 2.96 mg/dL — ABNORMAL HIGH (ref 0.4–1.2)
Creatinine, Ser: 3.01 mg/dL — ABNORMAL HIGH (ref 0.4–1.2)
GFR calc Af Amer: 18 mL/min — ABNORMAL LOW (ref >60)
GFR calc Af Amer: 20 mL/min — ABNORMAL LOW (ref >60)
GFR calc non Af Amer: 16 mL/min — ABNORMAL LOW (ref >60)
Glucose, Bld: 109 mg/dL — ABNORMAL HIGH (ref 70–99)
Glucose, Bld: 116 mg/dL — ABNORMAL HIGH (ref 70–99)
Glucose, Bld: 154 mg/dL — ABNORMAL HIGH (ref 70–99)
Potassium: 4.5 mEq/L (ref 3.5–5.1)
Sodium: 142 mEq/L (ref 135–145)

## 2010-07-19 LAB — PROTEIN ELECTROPHORESIS, SERUM
Beta 2: 7.3 % — ABNORMAL HIGH (ref 3.2–6.5)
Beta Globulin: 6.8 % (ref 4.7–7.2)
Gamma Globulin: 13.1 % (ref 11.1–18.8)
M-Spike, %: NOT DETECTED g/dL
Total Protein ELP: 5.8 g/dL — ABNORMAL LOW (ref 6.0–8.3)

## 2010-07-19 LAB — IRON AND TIBC
Iron: 12 ug/dL — ABNORMAL LOW (ref 42–135)
Saturation Ratios: 7 % — ABNORMAL LOW (ref 20–55)
TIBC: 173 ug/dL — ABNORMAL LOW (ref 250–470)
UIBC: 161 ug/dL

## 2010-07-19 LAB — BLOOD GAS, ARTERIAL
Acid-Base Excess: 1 mmol/L (ref 0.0–2.0)
Acid-Base Excess: 3.2 mmol/L — ABNORMAL HIGH (ref 0.0–2.0)
Acid-Base Excess: 5 mmol/L — ABNORMAL HIGH (ref 0.0–2.0)
Acid-Base Excess: 5.6 mmol/L — ABNORMAL HIGH (ref 0.0–2.0)
Acid-Base Excess: 6.6 mmol/L — ABNORMAL HIGH (ref 0.0–2.0)
Bicarbonate: 28.5 mEq/L — ABNORMAL HIGH (ref 20.0–24.0)
Bicarbonate: 29.8 mEq/L — ABNORMAL HIGH (ref 20.0–24.0)
Delivery systems: POSITIVE
Delivery systems: POSITIVE
Drawn by: 287601
Drawn by: 290241
Expiratory PAP: 16
FIO2: 0.4 %
FIO2: 0.5 %
FIO2: 0.6 %
FIO2: 1 %
FIO2: 40 %
Inspiratory PAP: 10
MECHVT: 500 mL
Mode: POSITIVE
Mode: POSITIVE
O2 Saturation: 92.5 %
O2 Saturation: 94.8 %
O2 Saturation: 95.6 %
O2 Saturation: 97.5 %
PEEP: 5 cmH2O
PEEP: 5 cmH2O
Patient temperature: 98.6
Patient temperature: 98.6
Patient temperature: 98.6
Patient temperature: 98.6
RATE: 20 resp/min
TCO2: 30.1 mmol/L (ref 0–100)
TCO2: 31.2 mmol/L (ref 0–100)
TCO2: 32.6 mmol/L (ref 0–100)
pCO2 arterial: 44.4 mmHg (ref 35.0–45.0)
pCO2 arterial: 61.2 mmHg (ref 35.0–45.0)
pH, Arterial: 7.188 — CL (ref 7.350–7.400)
pH, Arterial: 7.379 (ref 7.350–7.400)
pO2, Arterial: 72.6 mmHg — ABNORMAL LOW (ref 80.0–100.0)
pO2, Arterial: 73.8 mmHg — ABNORMAL LOW (ref 80.0–100.0)
pO2, Arterial: 80.3 mmHg (ref 80.0–100.0)
pO2, Arterial: 96.1 mmHg (ref 80.0–100.0)

## 2010-07-19 LAB — CULTURE, RESPIRATORY W GRAM STAIN

## 2010-07-19 LAB — RENAL FUNCTION PANEL
BUN: 79 mg/dL — ABNORMAL HIGH (ref 6–23)
CO2: 25 mEq/L (ref 19–32)
CO2: 29 mEq/L (ref 19–32)
CO2: 34 mEq/L — ABNORMAL HIGH (ref 19–32)
Calcium: 8.8 mg/dL (ref 8.4–10.5)
Chloride: 102 mEq/L (ref 96–112)
Chloride: 102 mEq/L (ref 96–112)
Chloride: 104 mEq/L (ref 96–112)
Creatinine, Ser: 2.93 mg/dL — ABNORMAL HIGH (ref 0.4–1.2)
Creatinine, Ser: 3.31 mg/dL — ABNORMAL HIGH (ref 0.4–1.2)
GFR calc Af Amer: 16 mL/min — ABNORMAL LOW (ref >60)
GFR calc Af Amer: 19 mL/min — ABNORMAL LOW (ref >60)
GFR calc non Af Amer: 14 mL/min — ABNORMAL LOW (ref >60)
GFR calc non Af Amer: 16 mL/min — ABNORMAL LOW (ref >60)
Glucose, Bld: 158 mg/dL — ABNORMAL HIGH (ref 70–99)
Glucose, Bld: 99 mg/dL (ref 70–99)
Potassium: 3.8 mEq/L (ref 3.5–5.1)

## 2010-07-19 LAB — BRAIN NATRIURETIC PEPTIDE
Pro B Natriuretic peptide (BNP): 422 pg/mL — ABNORMAL HIGH (ref 0.0–100.0)
Pro B Natriuretic peptide (BNP): 867 pg/mL — ABNORMAL HIGH (ref 0.0–100.0)

## 2010-07-19 LAB — CARDIAC PANEL(CRET KIN+CKTOT+MB+TROPI)
CK, MB: 0.6 ng/mL (ref 0.3–4.0)
Relative Index: INVALID (ref 0.0–2.5)
Total CK: 12 U/L (ref 7–177)
Troponin I: 0.04 ng/mL (ref 0.00–0.06)

## 2010-07-19 LAB — URINE CULTURE
Colony Count: NO GROWTH
Culture: NO GROWTH

## 2010-07-19 LAB — PROTIME-INR
INR: 1.7 — ABNORMAL HIGH (ref 0.00–1.49)
INR: 3.2 — ABNORMAL HIGH (ref 0.00–1.49)
Prothrombin Time: 21.3 seconds — ABNORMAL HIGH (ref 11.6–15.2)
Prothrombin Time: 31.2 seconds — ABNORMAL HIGH (ref 11.6–15.2)
Prothrombin Time: 46.2 seconds — ABNORMAL HIGH (ref 11.6–15.2)
Prothrombin Time: 58.2 seconds — ABNORMAL HIGH (ref 11.6–15.2)

## 2010-07-19 LAB — CK TOTAL AND CKMB (NOT AT ARMC)
Relative Index: INVALID (ref 0.0–2.5)
Total CK: 56 U/L (ref 7–177)

## 2010-07-19 LAB — COMPREHENSIVE METABOLIC PANEL
ALT: 23 U/L (ref 0–35)
AST: 18 U/L (ref 0–37)
Albumin: 2.4 g/dL — ABNORMAL LOW (ref 3.5–5.2)
Alkaline Phosphatase: 70 U/L (ref 39–117)
Calcium: 8.7 mg/dL (ref 8.4–10.5)
GFR calc Af Amer: 18 mL/min — ABNORMAL LOW (ref >60)
Glucose, Bld: 100 mg/dL — ABNORMAL HIGH (ref 70–99)
Potassium: 3.7 mEq/L (ref 3.5–5.1)
Sodium: 145 mEq/L (ref 135–145)
Total Protein: 6.3 g/dL (ref 6.0–8.3)

## 2010-07-19 LAB — TSH: TSH: 4.359 u[IU]/mL (ref 0.350–4.500)

## 2010-07-19 LAB — DIFFERENTIAL
Basophils Absolute: 0 10*3/uL (ref 0.0–0.1)
Basophils Relative: 0 % (ref 0–1)
Eosinophils Absolute: 0.1 10*3/uL (ref 0.0–0.7)
Monocytes Relative: 5 % (ref 3–12)
Neutro Abs: 10 10*3/uL — ABNORMAL HIGH (ref 1.7–7.7)
Neutrophils Relative %: 85 % — ABNORMAL HIGH (ref 43–77)

## 2010-07-19 LAB — POCT I-STAT, CHEM 8
BUN: 79 mg/dL — ABNORMAL HIGH (ref 6–23)
Calcium, Ion: 1.18 mmol/L (ref 1.12–1.32)
Hemoglobin: 11.2 g/dL — ABNORMAL LOW (ref 12.0–15.0)
TCO2: 30 mmol/L (ref 0–100)

## 2010-07-19 LAB — HEMOCCULT GUIAC POC 1CARD (OFFICE): Fecal Occult Bld: POSITIVE

## 2010-07-19 LAB — POCT I-STAT 3, ART BLOOD GAS (G3+)
Acid-Base Excess: 2 mmol/L (ref 0.0–2.0)
Bicarbonate: 31.2 mEq/L — ABNORMAL HIGH (ref 20.0–24.0)
Patient temperature: 99.7
TCO2: 34 mmol/L (ref 0–100)

## 2010-07-19 LAB — SODIUM, URINE, RANDOM: Sodium, Ur: 112 mEq/L

## 2010-07-19 LAB — PROTEIN, URINE, RANDOM: Total Protein, Urine: 26 mg/dL

## 2010-07-19 LAB — CREATININE, URINE, RANDOM: Creatinine, Urine: 27.7 mg/dL

## 2010-07-19 LAB — APTT: aPTT: 50 seconds — ABNORMAL HIGH (ref 24–37)

## 2010-07-19 LAB — LEGIONELLA ANTIGEN, URINE

## 2010-07-19 LAB — C3 COMPLEMENT: C3 Complement: 122 mg/dL (ref 88–201)

## 2010-07-20 ENCOUNTER — Ambulatory Visit (INDEPENDENT_AMBULATORY_CARE_PROVIDER_SITE_OTHER): Payer: Medicare Other | Admitting: Family Medicine

## 2010-07-20 ENCOUNTER — Encounter: Payer: Self-pay | Admitting: Family Medicine

## 2010-07-20 VITALS — BP 120/70 | HR 58 | Temp 97.6°F | Ht 63.0 in | Wt 214.0 lb

## 2010-07-20 DIAGNOSIS — Z5181 Encounter for therapeutic drug level monitoring: Secondary | ICD-10-CM

## 2010-07-20 DIAGNOSIS — J449 Chronic obstructive pulmonary disease, unspecified: Secondary | ICD-10-CM

## 2010-07-20 DIAGNOSIS — R5383 Other fatigue: Secondary | ICD-10-CM | POA: Insufficient documentation

## 2010-07-20 DIAGNOSIS — I509 Heart failure, unspecified: Secondary | ICD-10-CM

## 2010-07-20 DIAGNOSIS — R5381 Other malaise: Secondary | ICD-10-CM

## 2010-07-20 DIAGNOSIS — M791 Myalgia, unspecified site: Secondary | ICD-10-CM

## 2010-07-20 DIAGNOSIS — IMO0001 Reserved for inherently not codable concepts without codable children: Secondary | ICD-10-CM

## 2010-07-20 DIAGNOSIS — Z7901 Long term (current) use of anticoagulants: Secondary | ICD-10-CM

## 2010-07-20 DIAGNOSIS — I5032 Chronic diastolic (congestive) heart failure: Secondary | ICD-10-CM

## 2010-07-20 DIAGNOSIS — I4891 Unspecified atrial fibrillation: Secondary | ICD-10-CM

## 2010-07-20 DIAGNOSIS — N189 Chronic kidney disease, unspecified: Secondary | ICD-10-CM

## 2010-07-20 LAB — BRAIN NATRIURETIC PEPTIDE: Pro B Natriuretic peptide (BNP): 810 pg/mL — ABNORMAL HIGH (ref 0.0–100.0)

## 2010-07-20 LAB — POCT CARDIAC MARKERS
Myoglobin, poc: 152 ng/mL (ref 12–200)
Troponin i, poc: <0.05 ng/mL (ref 0.00–0.09)

## 2010-07-20 LAB — DIFFERENTIAL
Lymphs Abs: 0.9 10*3/uL (ref 0.7–4.0)
Monocytes Relative: 6 % (ref 3–12)
Neutro Abs: 11.3 10*3/uL — ABNORMAL HIGH (ref 1.7–7.7)
Neutrophils Relative %: 86 % — ABNORMAL HIGH (ref 43–77)

## 2010-07-20 LAB — URINALYSIS, ROUTINE W REFLEX MICROSCOPIC
Ketones, ur: NEGATIVE mg/dL
Nitrite: NEGATIVE
Specific Gravity, Urine: 1.016 (ref 1.005–1.030)
pH: 5.5 (ref 5.0–8.0)

## 2010-07-20 LAB — POCT I-STAT, CHEM 8
BUN: 69 mg/dL — ABNORMAL HIGH (ref 6–23)
Calcium, Ion: 1.17 mmol/L (ref 1.12–1.32)
Chloride: 101 mEq/L (ref 96–112)
HCT: 33 % — ABNORMAL LOW (ref 36.0–46.0)
Sodium: 138 mEq/L (ref 135–145)
TCO2: 32 mmol/L (ref 0–100)

## 2010-07-20 LAB — BASIC METABOLIC PANEL
BUN: 67 mg/dL — ABNORMAL HIGH (ref 6–23)
CO2: 32 mEq/L (ref 19–32)
Chloride: 103 mEq/L (ref 96–112)
Creatinine, Ser: 2.8 mg/dL — ABNORMAL HIGH (ref 0.4–1.2)
Glucose, Bld: 152 mg/dL — ABNORMAL HIGH (ref 70–99)

## 2010-07-20 LAB — PROTIME-INR
INR: 2.5 — ABNORMAL HIGH (ref 0.00–1.49)
Prothrombin Time: 28.5 seconds — ABNORMAL HIGH (ref 11.6–15.2)

## 2010-07-20 LAB — CBC
HCT: 31 % — ABNORMAL LOW (ref 36.0–46.0)
Hemoglobin: 10.5 g/dL — ABNORMAL LOW (ref 12.0–15.0)
MCHC: 33.8 g/dL (ref 30.0–36.0)
MCV: 84.1 fL (ref 78.0–100.0)
RDW: 16 % — ABNORMAL HIGH (ref 11.5–15.5)

## 2010-07-20 LAB — URINE MICROSCOPIC-ADD ON

## 2010-07-20 NOTE — Progress Notes (Signed)
77 year old:  Here to discuss the need for potential hospital bed, and sleep disturbance, as well as followup on COPD and myalgias.  COPD: 2 L On Advair and Spiriva When lying dwn, feels like she cannot breathe and feels short winded. Since Nov. Has been in a recliner. When lyind down in her bed, feels like she cannot breathe.  Orthopnea - cannot get breath with moving.  The patient is on oxygen 100% of the time, and when she reclines, she is significantly short of breath, therefore she has been sleeping in a recliner since November. Whenever she sleeps in a recumbent position, she cannot read. The patient is on maximal medical management for her COPD, and is also on 2 L of oxygen at all times. She knows that she is compliant currently.  Congestive heart failure, diastolic, recent ejection fraction is normal.  Mild as, patient does have some noted diffuse myalgias but will very from joint to joint.  Renal failure, chronic, followed by [facility], but most recent creatinine has been around 2.7. She generally does not feel that well, and has some complaints of myalgias as well as cramping.  Patient Active Problem List  Diagnoses  . HYPOTHYROIDISM  . DIABETES MELLITUS, TYPE II  . OVERWEIGHT/OBESITY  . OBSTRUCTIVE SLEEP APNEA  . HYPERTENSION, UNSPECIFIED  . ATRIAL FIBRILLATION  . DIASTOLIC HEART FAILURE, CHRONIC  . COPD  . RENAL FAILURE, CHRONIC  . SLEEP APNEA  . TACHYCARDIA  . NONSPECIFIC ABNORMAL TOXICOLOGICAL FINDINGS  . HYPERLIPIDEMIA-MIXED  . Fatigue   Past Medical History  Diagnosis Date  . Diabetes mellitus   . Obesity   . COPD (chronic obstructive pulmonary disease)   . Diastolic CHF, chronic   . Atrial fibrillation   . CKD (chronic kidney disease)   . PNA (pneumonia)     h/o with ARDS in 2005; MRSA PNA with prolonged hospitalization in [facility] 11/11  . Diverticulum     Jejunal  . OSA (obstructive sleep apnea)     possible  . Anemia     of chronic  disease  . Hypothyroidism   . Gout    Past Surgical History  Procedure Date  . Tracheostomy     ARDS/ ICU admission, trach 2005   History  Substance Use Topics  . Smoking status: Former Smoker -- 1.0 packs/day    Quit date: 04/11/1968  . Smokeless tobacco: Not on file  . Alcohol Use: No   Family History  Problem Relation Age of Onset  . Emphysema Mother     and asthma  . Heart disease Neg Hx     premature   Allergies  Allergen Reactions  . Sulfonamide Derivatives     REACTION: Reaction not known   Current Outpatient Prescriptions on File Prior to Visit  Medication Sig Dispense Refill  . albuterol (VENTOLIN HFA) 108 (90 BASE) MCG/ACT inhaler Inhale 2 puffs into the lungs every 6 (six) hours as needed.        Marland Kitchen amiodarone (PACERONE) 200 MG tablet Take 200 mg by mouth daily.        Marland Kitchen amLODipine (NORVASC) 10 MG tablet Take 10 mg by mouth daily.        . Fluticasone-Salmeterol (ADVAIR DISKUS) 250-50 MCG/DOSE AEPB Inhale 1 puff into the lungs every 12 (twelve) hours.        . furosemide (LASIX) 40 MG tablet Take 40 mg by mouth 2 (two) times daily.        Marland Kitchen levalbuterol (XOPENEX)  0.63 MG/3ML nebulizer solution Take 1 ampule by nebulization every 4 (four) hours as needed.        Marland Kitchen levothyroxine (SYNTHROID, LEVOTHROID) 25 MCG tablet Take 25 mcg by mouth daily.        . magnesium oxide (MAG-OX) 400 MG tablet Take 400 mg by mouth 3 (three) times daily.        . metoprolol tartrate (LOPRESSOR) 25 MG tablet Take 25 mg by mouth 2 (two) times daily.        Marland Kitchen tiotropium (SPIRIVA) 18 MCG inhalation capsule Place 18 mcg into inhaler and inhale daily.        Marland Kitchen warfarin (COUMADIN) 2 MG tablet Take 2 mg by mouth as directed.        . warfarin (COUMADIN) 5 MG tablet Take 5 mg by mouth as directed.        . warfarin (COUMADIN) 6 MG tablet Take 6 mg by mouth as directed.        . ranitidine (ZANTAC) 150 MG capsule Take 150 mg by mouth every evening.         ROS: GEN: No acute illnesses, no  fevers, chills. GI: No n/v/d, eating normally Pulm: as above No chest pain Interactive and getting along well at home.  Otherwise, ROS is as per the HPI.  GEN: WDWN, NAD, Non-toxic, A & O x 3, uses a walker.  HEENT: Atraumatic, Normocephalic. Neck supple. No masses, No LAD. Ears and Nose: No external deformity. CV: RRR, No M/G/R. No JVD. No thrill. No extra heart sounds. PULM: CTA B, no wheezes, crackles, rhonchi. No retractions. No resp. distress. No accessory muscle use. ABD: S, NT, ND, +BS. No rebound tenderness. No HSM.  EXTR: 2 + le edema with chronic venous stasis changes NEURO Normal gait.  PSYCH: Normally interactive. Conversant. Not depressed or anxious appearing.  Calm demeanor.

## 2010-07-20 NOTE — Assessment & Plan Note (Signed)
Continue with current medications, as well as oxygen at all times.  Hospital Bed documentation: Face to face encounter documentation follows: Patient needs hospital bed for the following reasons: Severe COPD with oxygen dependence. The patient is unable to sleep without significant shortness of breath and requires greater than 30 of elevation at the head of the bed for appropriate sleep. Pillows, and other associated manual manipulation has been attempted without good success. Currently, the patient is sleeping in a recliner without significant rest.  The patient meets criteria for a manual hospital bed. Given health, I would anticipate a lifelong need.  Current COPD status, fair. Current shortness of breath with lying flat.  Length of need: Lifelong, 99 years

## 2010-07-20 NOTE — Assessment & Plan Note (Signed)
Check BMP and CK

## 2010-07-20 NOTE — Patient Instructions (Signed)
Continue 2.5 mg daily, recheck in 2 weeks

## 2010-07-20 NOTE — Patient Instructions (Signed)
REFERRAL: GO THE THE FRONT ROOM AT THE ENTRANCE OF OUR CLINIC, NEAR CHECK IN. ASK FOR [name]. SHE WILL HELP YOU SET UP YOUR REFERRAL. DATE: TIME:  

## 2010-07-21 LAB — GLUCOSE, CAPILLARY
Glucose-Capillary: 107 mg/dL — ABNORMAL HIGH (ref 70–99)
Glucose-Capillary: 108 mg/dL — ABNORMAL HIGH (ref 70–99)
Glucose-Capillary: 112 mg/dL — ABNORMAL HIGH (ref 70–99)
Glucose-Capillary: 113 mg/dL — ABNORMAL HIGH (ref 70–99)
Glucose-Capillary: 117 mg/dL — ABNORMAL HIGH (ref 70–99)
Glucose-Capillary: 119 mg/dL — ABNORMAL HIGH (ref 70–99)
Glucose-Capillary: 120 mg/dL — ABNORMAL HIGH (ref 70–99)
Glucose-Capillary: 122 mg/dL — ABNORMAL HIGH (ref 70–99)
Glucose-Capillary: 124 mg/dL — ABNORMAL HIGH (ref 70–99)
Glucose-Capillary: 127 mg/dL — ABNORMAL HIGH (ref 70–99)
Glucose-Capillary: 130 mg/dL — ABNORMAL HIGH (ref 70–99)
Glucose-Capillary: 132 mg/dL — ABNORMAL HIGH (ref 70–99)
Glucose-Capillary: 132 mg/dL — ABNORMAL HIGH (ref 70–99)
Glucose-Capillary: 133 mg/dL — ABNORMAL HIGH (ref 70–99)
Glucose-Capillary: 135 mg/dL — ABNORMAL HIGH (ref 70–99)
Glucose-Capillary: 138 mg/dL — ABNORMAL HIGH (ref 70–99)
Glucose-Capillary: 139 mg/dL — ABNORMAL HIGH (ref 70–99)
Glucose-Capillary: 139 mg/dL — ABNORMAL HIGH (ref 70–99)
Glucose-Capillary: 141 mg/dL — ABNORMAL HIGH (ref 70–99)
Glucose-Capillary: 142 mg/dL — ABNORMAL HIGH (ref 70–99)
Glucose-Capillary: 142 mg/dL — ABNORMAL HIGH (ref 70–99)
Glucose-Capillary: 145 mg/dL — ABNORMAL HIGH (ref 70–99)
Glucose-Capillary: 147 mg/dL — ABNORMAL HIGH (ref 70–99)
Glucose-Capillary: 148 mg/dL — ABNORMAL HIGH (ref 70–99)
Glucose-Capillary: 154 mg/dL — ABNORMAL HIGH (ref 70–99)
Glucose-Capillary: 162 mg/dL — ABNORMAL HIGH (ref 70–99)
Glucose-Capillary: 164 mg/dL — ABNORMAL HIGH (ref 70–99)
Glucose-Capillary: 176 mg/dL — ABNORMAL HIGH (ref 70–99)
Glucose-Capillary: 180 mg/dL — ABNORMAL HIGH (ref 70–99)
Glucose-Capillary: 183 mg/dL — ABNORMAL HIGH (ref 70–99)
Glucose-Capillary: 187 mg/dL — ABNORMAL HIGH (ref 70–99)
Glucose-Capillary: 190 mg/dL — ABNORMAL HIGH (ref 70–99)
Glucose-Capillary: 193 mg/dL — ABNORMAL HIGH (ref 70–99)
Glucose-Capillary: 194 mg/dL — ABNORMAL HIGH (ref 70–99)
Glucose-Capillary: 207 mg/dL — ABNORMAL HIGH (ref 70–99)
Glucose-Capillary: 209 mg/dL — ABNORMAL HIGH (ref 70–99)
Glucose-Capillary: 219 mg/dL — ABNORMAL HIGH (ref 70–99)
Glucose-Capillary: 222 mg/dL — ABNORMAL HIGH (ref 70–99)
Glucose-Capillary: 224 mg/dL — ABNORMAL HIGH (ref 70–99)
Glucose-Capillary: 236 mg/dL — ABNORMAL HIGH (ref 70–99)
Glucose-Capillary: 244 mg/dL — ABNORMAL HIGH (ref 70–99)
Glucose-Capillary: 257 mg/dL — ABNORMAL HIGH (ref 70–99)
Glucose-Capillary: 96 mg/dL (ref 70–99)
Glucose-Capillary: 107 mg/dL — ABNORMAL HIGH (ref 70–99)
Glucose-Capillary: 118 mg/dL — ABNORMAL HIGH (ref 70–99)
Glucose-Capillary: 119 mg/dL — ABNORMAL HIGH (ref 70–99)
Glucose-Capillary: 125 mg/dL — ABNORMAL HIGH (ref 70–99)
Glucose-Capillary: 127 mg/dL — ABNORMAL HIGH (ref 70–99)
Glucose-Capillary: 147 mg/dL — ABNORMAL HIGH (ref 70–99)
Glucose-Capillary: 213 mg/dL — ABNORMAL HIGH (ref 70–99)
Glucose-Capillary: 92 mg/dL (ref 70–99)
Glucose-Capillary: 99 mg/dL (ref 70–99)

## 2010-07-21 LAB — DIFFERENTIAL
Basophils Absolute: 0 10*3/uL (ref 0.0–0.1)
Basophils Absolute: 0 10*3/uL (ref 0.0–0.1)
Basophils Relative: 0 % (ref 0–1)
Basophils Relative: 1 % (ref 0–1)
Basophils Relative: 1 % (ref 0–1)
Basophils Relative: 0 % (ref 0–1)
Eosinophils Absolute: 0 10*3/uL (ref 0.0–0.7)
Eosinophils Absolute: 0.2 10*3/uL (ref 0.0–0.7)
Eosinophils Absolute: 0.3 10*3/uL (ref 0.0–0.7)
Eosinophils Absolute: 0.1 10*3/uL (ref 0.0–0.7)
Eosinophils Absolute: 0.2 10*3/uL (ref 0.0–0.7)
Lymphocytes Relative: 5 % — ABNORMAL LOW (ref 12–46)
Lymphs Abs: 0.6 10*3/uL — ABNORMAL LOW (ref 0.7–4.0)
Lymphs Abs: 1.3 10*3/uL (ref 0.7–4.0)
Lymphs Abs: 1.4 10*3/uL (ref 0.7–4.0)
Monocytes Absolute: 0.4 10*3/uL (ref 0.1–1.0)
Monocytes Absolute: 0.2 10*3/uL (ref 0.1–1.0)
Monocytes Relative: 4 % (ref 3–12)
Monocytes Relative: 4 % (ref 3–12)
Monocytes Relative: 5 % (ref 3–12)
Monocytes Relative: 9 % (ref 3–12)
Monocytes Relative: 2 % — ABNORMAL LOW (ref 3–12)
Neutro Abs: 11.3 10*3/uL — ABNORMAL HIGH (ref 1.7–7.7)
Neutro Abs: 7 10*3/uL (ref 1.7–7.7)
Neutro Abs: 9.7 10*3/uL — ABNORMAL HIGH (ref 1.7–7.7)
Neutrophils Relative %: 77 % (ref 43–77)
Neutrophils Relative %: 80 % — ABNORMAL HIGH (ref 43–77)
Neutrophils Relative %: 91 % — ABNORMAL HIGH (ref 43–77)
Neutrophils Relative %: 86 % — ABNORMAL HIGH (ref 43–77)

## 2010-07-21 LAB — URINALYSIS, MICROSCOPIC ONLY
Nitrite: NEGATIVE
Specific Gravity, Urine: 1.02 (ref 1.005–1.030)
pH: 5 (ref 5.0–8.0)

## 2010-07-21 LAB — BASIC METABOLIC PANEL
BUN: 41 mg/dL — ABNORMAL HIGH (ref 6–23)
BUN: 41 mg/dL — ABNORMAL HIGH (ref 6–23)
BUN: 42 mg/dL — ABNORMAL HIGH (ref 6–23)
BUN: 50 mg/dL — ABNORMAL HIGH (ref 6–23)
BUN: 50 mg/dL — ABNORMAL HIGH (ref 6–23)
BUN: 63 mg/dL — ABNORMAL HIGH (ref 6–23)
BUN: 64 mg/dL — ABNORMAL HIGH (ref 6–23)
BUN: 58 mg/dL — ABNORMAL HIGH (ref 6–23)
BUN: 59 mg/dL — ABNORMAL HIGH (ref 6–23)
BUN: 60 mg/dL — ABNORMAL HIGH (ref 6–23)
CO2: 35 mEq/L — ABNORMAL HIGH (ref 19–32)
CO2: 44 mEq/L — ABNORMAL HIGH (ref 19–32)
CO2: 30 mEq/L (ref 19–32)
Calcium: 8.4 mg/dL (ref 8.4–10.5)
Calcium: 8.8 mg/dL (ref 8.4–10.5)
Calcium: 8.9 mg/dL (ref 8.4–10.5)
Calcium: 9 mg/dL (ref 8.4–10.5)
Calcium: 8.2 mg/dL — ABNORMAL LOW (ref 8.4–10.5)
Calcium: 8.8 mg/dL (ref 8.4–10.5)
Chloride: 100 mEq/L (ref 96–112)
Chloride: 101 mEq/L (ref 96–112)
Chloride: 101 mEq/L (ref 96–112)
Chloride: 102 mEq/L (ref 96–112)
Chloride: 97 mEq/L (ref 96–112)
Creatinine, Ser: 1.83 mg/dL — ABNORMAL HIGH (ref 0.4–1.2)
Creatinine, Ser: 1.85 mg/dL — ABNORMAL HIGH (ref 0.4–1.2)
Creatinine, Ser: 1.9 mg/dL — ABNORMAL HIGH (ref 0.4–1.2)
Creatinine, Ser: 2.26 mg/dL — ABNORMAL HIGH (ref 0.4–1.2)
Creatinine, Ser: 1.91 mg/dL — ABNORMAL HIGH (ref 0.4–1.2)
Creatinine, Ser: 2.22 mg/dL — ABNORMAL HIGH (ref 0.4–1.2)
Creatinine, Ser: 2.32 mg/dL — ABNORMAL HIGH (ref 0.4–1.2)
GFR calc Af Amer: 25 mL/min — ABNORMAL LOW (ref >60)
GFR calc Af Amer: 38 mL/min — ABNORMAL LOW (ref >60)
GFR calc Af Amer: 31 mL/min — ABNORMAL LOW (ref >60)
GFR calc non Af Amer: 21 mL/min — ABNORMAL LOW (ref >60)
GFR calc non Af Amer: 26 mL/min — ABNORMAL LOW (ref >60)
GFR calc non Af Amer: 27 mL/min — ABNORMAL LOW (ref >60)
GFR calc non Af Amer: 30 mL/min — ABNORMAL LOW (ref >60)
GFR calc non Af Amer: 20 mL/min — ABNORMAL LOW (ref >60)
GFR calc non Af Amer: 21 mL/min — ABNORMAL LOW (ref >60)
Glucose, Bld: 103 mg/dL — ABNORMAL HIGH (ref 70–99)
Glucose, Bld: 105 mg/dL — ABNORMAL HIGH (ref 70–99)
Glucose, Bld: 125 mg/dL — ABNORMAL HIGH (ref 70–99)
Glucose, Bld: 202 mg/dL — ABNORMAL HIGH (ref 70–99)
Glucose, Bld: 97 mg/dL (ref 70–99)
Glucose, Bld: 229 mg/dL — ABNORMAL HIGH (ref 70–99)
Potassium: 4.4 mEq/L (ref 3.5–5.1)
Potassium: 4.6 mEq/L (ref 3.5–5.1)
Potassium: 4.6 mEq/L (ref 3.5–5.1)
Sodium: 148 mEq/L — ABNORMAL HIGH (ref 135–145)

## 2010-07-21 LAB — COMPREHENSIVE METABOLIC PANEL
ALT: 12 U/L (ref 0–35)
ALT: 13 U/L (ref 0–35)
ALT: 21 U/L (ref 0–35)
ALT: 18 U/L (ref 0–35)
Albumin: 2.5 g/dL — ABNORMAL LOW (ref 3.5–5.2)
Albumin: 3.2 g/dL — ABNORMAL LOW (ref 3.5–5.2)
Alkaline Phosphatase: 48 U/L (ref 39–117)
Alkaline Phosphatase: 49 U/L (ref 39–117)
Alkaline Phosphatase: 70 U/L (ref 39–117)
Alkaline Phosphatase: 60 U/L (ref 39–117)
BUN: 43 mg/dL — ABNORMAL HIGH (ref 6–23)
BUN: 52 mg/dL — ABNORMAL HIGH (ref 6–23)
BUN: 57 mg/dL — ABNORMAL HIGH (ref 6–23)
CO2: 31 mEq/L (ref 19–32)
Calcium: 8.4 mg/dL (ref 8.4–10.5)
Chloride: 106 mEq/L (ref 96–112)
Chloride: 99 mEq/L (ref 96–112)
GFR calc Af Amer: 36 mL/min — ABNORMAL LOW (ref >60)
GFR calc non Af Amer: 28 mL/min — ABNORMAL LOW (ref >60)
Glucose, Bld: 108 mg/dL — ABNORMAL HIGH (ref 70–99)
Glucose, Bld: 162 mg/dL — ABNORMAL HIGH (ref 70–99)
Glucose, Bld: 218 mg/dL — ABNORMAL HIGH (ref 70–99)
Potassium: 3 mEq/L — ABNORMAL LOW (ref 3.5–5.1)
Potassium: 4.1 mEq/L (ref 3.5–5.1)
Potassium: 4.8 mEq/L (ref 3.5–5.1)
Potassium: 4.2 mEq/L (ref 3.5–5.1)
Sodium: 136 mEq/L (ref 135–145)
Sodium: 142 mEq/L (ref 135–145)
Sodium: 143 mEq/L (ref 135–145)
Sodium: 147 mEq/L — ABNORMAL HIGH (ref 135–145)
Sodium: 136 mEq/L (ref 135–145)
Total Bilirubin: 0.4 mg/dL (ref 0.3–1.2)
Total Bilirubin: 0.6 mg/dL (ref 0.3–1.2)
Total Protein: 5 g/dL — ABNORMAL LOW (ref 6.0–8.3)
Total Protein: 5.3 g/dL — ABNORMAL LOW (ref 6.0–8.3)
Total Protein: 6.1 g/dL (ref 6.0–8.3)
Total Protein: 6.4 g/dL (ref 6.0–8.3)

## 2010-07-21 LAB — URINALYSIS, ROUTINE W REFLEX MICROSCOPIC
Bilirubin Urine: NEGATIVE
Glucose, UA: NEGATIVE mg/dL
Ketones, ur: NEGATIVE mg/dL
Ketones, ur: 15 mg/dL — AB
Leukocytes, UA: NEGATIVE
Nitrite: NEGATIVE
Nitrite: NEGATIVE
Nitrite: NEGATIVE
Protein, ur: 30 mg/dL — AB
Protein, ur: 100 mg/dL — AB
Protein, ur: 100 mg/dL — AB
Specific Gravity, Urine: 1.013 (ref 1.005–1.030)
Specific Gravity, Urine: 1.019 (ref 1.005–1.030)
pH: 5 (ref 5.0–8.0)
pH: 5 (ref 5.0–8.0)

## 2010-07-21 LAB — CBC
HCT: 37.3 % (ref 36.0–46.0)
HCT: 37.6 % (ref 36.0–46.0)
HCT: 42.3 % (ref 36.0–46.0)
HCT: 44.7 % (ref 36.0–46.0)
HCT: 44.9 % (ref 36.0–46.0)
HCT: 45.8 % (ref 36.0–46.0)
Hemoglobin: 12.5 g/dL (ref 12.0–15.0)
Hemoglobin: 13.9 g/dL (ref 12.0–15.0)
Hemoglobin: 14.5 g/dL (ref 12.0–15.0)
Hemoglobin: 15 g/dL (ref 12.0–15.0)
MCHC: 33.1 g/dL (ref 30.0–36.0)
MCHC: 33.4 g/dL (ref 30.0–36.0)
MCHC: 33.7 g/dL (ref 30.0–36.0)
MCV: 85.9 fL (ref 78.0–100.0)
MCV: 85.9 fL (ref 78.0–100.0)
MCV: 86.5 fL (ref 78.0–100.0)
MCV: 86.9 fL (ref 78.0–100.0)
MCV: 89.3 fL (ref 78.0–100.0)
MCV: 89.9 fL (ref 78.0–100.0)
MCV: 86.7 fL (ref 78.0–100.0)
MCV: 88.8 fL (ref 78.0–100.0)
Platelets: 161 10*3/uL (ref 150–400)
Platelets: 161 10*3/uL (ref 150–400)
Platelets: 163 10*3/uL (ref 150–400)
Platelets: 165 10*3/uL (ref 150–400)
Platelets: 170 10*3/uL (ref 150–400)
Platelets: 179 10*3/uL (ref 150–400)
Platelets: 186 10*3/uL (ref 150–400)
Platelets: 145 10*3/uL — ABNORMAL LOW (ref 150–400)
Platelets: 150 10*3/uL (ref 150–400)
Platelets: 151 10*3/uL (ref 150–400)
Platelets: 185 10*3/uL (ref 150–400)
RBC: 4.38 MIL/uL (ref 3.87–5.11)
RBC: 4.42 MIL/uL (ref 3.87–5.11)
RBC: 4.87 MIL/uL (ref 3.87–5.11)
RBC: 4.96 MIL/uL (ref 3.87–5.11)
RBC: 5.07 MIL/uL (ref 3.87–5.11)
RBC: 4.9 MIL/uL (ref 3.87–5.11)
RDW: 15.6 % — ABNORMAL HIGH (ref 11.5–15.5)
RDW: 15.8 % — ABNORMAL HIGH (ref 11.5–15.5)
RDW: 15.8 % — ABNORMAL HIGH (ref 11.5–15.5)
RDW: 15.8 % — ABNORMAL HIGH (ref 11.5–15.5)
RDW: 15.9 % — ABNORMAL HIGH (ref 11.5–15.5)
RDW: 15.9 % — ABNORMAL HIGH (ref 11.5–15.5)
RDW: 16.2 % — ABNORMAL HIGH (ref 11.5–15.5)
RDW: 16.4 % — ABNORMAL HIGH (ref 11.5–15.5)
RDW: 15.9 % — ABNORMAL HIGH (ref 11.5–15.5)
RDW: 16 % — ABNORMAL HIGH (ref 11.5–15.5)
WBC: 10 10*3/uL (ref 4.0–10.5)
WBC: 10.6 10*3/uL — ABNORMAL HIGH (ref 4.0–10.5)
WBC: 11.3 10*3/uL — ABNORMAL HIGH (ref 4.0–10.5)
WBC: 12 10*3/uL — ABNORMAL HIGH (ref 4.0–10.5)
WBC: 12.5 10*3/uL — ABNORMAL HIGH (ref 4.0–10.5)
WBC: 12.9 10*3/uL — ABNORMAL HIGH (ref 4.0–10.5)
WBC: 12.1 10*3/uL — ABNORMAL HIGH (ref 4.0–10.5)
WBC: 14.9 10*3/uL — ABNORMAL HIGH (ref 4.0–10.5)

## 2010-07-21 LAB — BLOOD GAS, ARTERIAL
Acid-Base Excess: 3 mmol/L — ABNORMAL HIGH (ref 0.0–2.0)
Acid-Base Excess: 5.3 mmol/L — ABNORMAL HIGH (ref 0.0–2.0)
Acid-Base Excess: 9.6 mmol/L — ABNORMAL HIGH (ref 0.0–2.0)
FIO2: 1 %
Inspiratory PAP: 10
Patient temperature: 98.6
TCO2: 31.9 mmol/L (ref 0–100)
TCO2: 37.1 mmol/L (ref 0–100)
pCO2 arterial: 53.6 mmHg — ABNORMAL HIGH (ref 35.0–45.0)
pCO2 arterial: 61.5 mmHg (ref 35.0–45.0)
pCO2 arterial: 66.3 mmHg (ref 35.0–45.0)
pO2, Arterial: 53.1 mmHg — ABNORMAL LOW (ref 80.0–100.0)

## 2010-07-21 LAB — POCT I-STAT 3, ART BLOOD GAS (G3+)
O2 Saturation: 89 %
pCO2 arterial: 72.5 mmHg (ref 35.0–45.0)
pH, Arterial: 7.354 (ref 7.350–7.400)
pO2, Arterial: 63 mmHg — ABNORMAL LOW (ref 80.0–100.0)

## 2010-07-21 LAB — PHOSPHORUS
Phosphorus: 5.1 mg/dL — ABNORMAL HIGH (ref 2.3–4.6)
Phosphorus: 5.7 mg/dL — ABNORMAL HIGH (ref 2.3–4.6)

## 2010-07-21 LAB — URINE MICROSCOPIC-ADD ON

## 2010-07-21 LAB — PREALBUMIN
Prealbumin: 11.7 mg/dL — ABNORMAL LOW (ref 18.0–45.0)
Prealbumin: 26.9 mg/dL (ref 18.0–45.0)

## 2010-07-21 LAB — URINE CULTURE: Culture: NO GROWTH

## 2010-07-21 LAB — MAGNESIUM
Magnesium: 2 mg/dL (ref 1.5–2.5)
Magnesium: 2.1 mg/dL (ref 1.5–2.5)
Magnesium: 2.1 mg/dL (ref 1.5–2.5)
Magnesium: 2.2 mg/dL (ref 1.5–2.5)

## 2010-07-21 LAB — PROTIME-INR
INR: 1.1 (ref 0.00–1.49)
INR: 1.5 (ref 0.00–1.49)
INR: 2.1 — ABNORMAL HIGH (ref 0.00–1.49)
Prothrombin Time: 14 seconds (ref 11.6–15.2)
Prothrombin Time: 14.5 seconds (ref 11.6–15.2)
Prothrombin Time: 18.5 seconds — ABNORMAL HIGH (ref 11.6–15.2)
Prothrombin Time: 22.3 seconds — ABNORMAL HIGH (ref 11.6–15.2)
Prothrombin Time: 23.5 seconds — ABNORMAL HIGH (ref 11.6–15.2)

## 2010-07-21 LAB — CHOLESTEROL, TOTAL
Cholesterol: 129 mg/dL (ref 0–200)
Cholesterol: 185 mg/dL (ref 0–200)

## 2010-07-21 LAB — PROTEIN, URINE, 24 HOUR
Collection Interval-UPROT: 24 hours
Protein, 24H Urine: 2040 mg/d — ABNORMAL HIGH (ref 50–100)
Urine Total Volume-UPROT: 4250 mL

## 2010-07-21 LAB — C4 COMPLEMENT: Complement C4, Body Fluid: 22 mg/dL (ref 16–47)

## 2010-07-21 LAB — CRYOGLOBULIN

## 2010-07-21 LAB — BRAIN NATRIURETIC PEPTIDE
Pro B Natriuretic peptide (BNP): 656 pg/mL — ABNORMAL HIGH (ref 0.0–100.0)
Pro B Natriuretic peptide (BNP): 743 pg/mL — ABNORMAL HIGH (ref 0.0–100.0)
Pro B Natriuretic peptide (BNP): 448 pg/mL — ABNORMAL HIGH (ref 0.0–100.0)

## 2010-07-21 LAB — CREATININE, URINE, 24 HOUR
Creatinine, 24H Ur: 837 mg/d (ref 700–1800)
Creatinine, Urine: 19.7 mg/dL

## 2010-07-21 LAB — C3 COMPLEMENT: C3 Complement: 92 mg/dL (ref 88–201)

## 2010-07-21 LAB — TRIGLYCERIDES: Triglycerides: 163 mg/dL — ABNORMAL HIGH (ref <150)

## 2010-08-03 ENCOUNTER — Ambulatory Visit (INDEPENDENT_AMBULATORY_CARE_PROVIDER_SITE_OTHER): Payer: Medicare Other | Admitting: Family Medicine

## 2010-08-03 ENCOUNTER — Other Ambulatory Visit: Payer: Self-pay | Admitting: Family Medicine

## 2010-08-03 DIAGNOSIS — I4891 Unspecified atrial fibrillation: Secondary | ICD-10-CM

## 2010-08-03 DIAGNOSIS — Z7901 Long term (current) use of anticoagulants: Secondary | ICD-10-CM

## 2010-08-03 DIAGNOSIS — Z5181 Encounter for therapeutic drug level monitoring: Secondary | ICD-10-CM

## 2010-08-03 LAB — PROTIME-INR: INR: 13.1 ratio (ref 0.8–1.0)

## 2010-08-03 MED ORDER — PHYTONADIONE 5 MG PO TABS: 5.0000 mg | ORAL_TABLET | Freq: Once | ORAL | Status: AC

## 2010-08-03 NOTE — Progress Notes (Signed)
Spoke to [employer], CMA.  No bleeding. Hold Coumadin today and tomorrow. Vitamin K, 5 mg po sent in now.  Resume at 2 mg po daily for Coumadin dosage. Call in 2 mg tablets, #30, 0 refills Recheck 1 week. Hannah Beat, MD

## 2010-08-10 ENCOUNTER — Ambulatory Visit (INDEPENDENT_AMBULATORY_CARE_PROVIDER_SITE_OTHER): Payer: Medicare Other | Admitting: Family Medicine

## 2010-08-10 DIAGNOSIS — Z7901 Long term (current) use of anticoagulants: Secondary | ICD-10-CM

## 2010-08-10 DIAGNOSIS — I4891 Unspecified atrial fibrillation: Secondary | ICD-10-CM

## 2010-08-10 DIAGNOSIS — Z5181 Encounter for therapeutic drug level monitoring: Secondary | ICD-10-CM

## 2010-08-10 NOTE — Patient Instructions (Signed)
2 mg daily, check in 1 week 

## 2010-08-16 ENCOUNTER — Ambulatory Visit (INDEPENDENT_AMBULATORY_CARE_PROVIDER_SITE_OTHER): Payer: Medicare Other | Admitting: Family Medicine

## 2010-08-16 DIAGNOSIS — Z7901 Long term (current) use of anticoagulants: Secondary | ICD-10-CM

## 2010-08-16 DIAGNOSIS — I4891 Unspecified atrial fibrillation: Secondary | ICD-10-CM

## 2010-08-16 DIAGNOSIS — Z5181 Encounter for therapeutic drug level monitoring: Secondary | ICD-10-CM

## 2010-08-16 NOTE — Patient Instructions (Signed)
2 mg daily, check in 1 week

## 2010-08-18 ENCOUNTER — Other Ambulatory Visit: Payer: Medicare Other | Admitting: *Deleted

## 2010-08-20 ENCOUNTER — Telehealth: Payer: Self-pay | Admitting: Family Medicine

## 2010-08-20 MED ORDER — WARFARIN SODIUM 2 MG PO TABS: 2.0000 mg | ORAL_TABLET | ORAL | Status: DC

## 2010-08-20 NOTE — Telephone Encounter (Signed)
Pt called, says her coumadin medication has been increased to  2 mg per Dr. Patsy Lager.  Pt needs a new precription.

## 2010-08-23 ENCOUNTER — Ambulatory Visit (INDEPENDENT_AMBULATORY_CARE_PROVIDER_SITE_OTHER): Payer: Medicare Other | Admitting: Family Medicine

## 2010-08-23 DIAGNOSIS — I4891 Unspecified atrial fibrillation: Secondary | ICD-10-CM

## 2010-08-23 DIAGNOSIS — Z5181 Encounter for therapeutic drug level monitoring: Secondary | ICD-10-CM

## 2010-08-23 DIAGNOSIS — Z7901 Long term (current) use of anticoagulants: Secondary | ICD-10-CM

## 2010-08-23 NOTE — Patient Instructions (Signed)
Continue current dose, recheck in 2 weeks.

## 2010-08-24 NOTE — Consult Note (Signed)
NAME:  [name], [name]             ACCOUNT NO.:  [id number]   MEDICAL RECORD NO.:  [id number]          PATIENT TYPE:  INP   LOCATION:  2910                         FACILITY:  [facility]   PHYSICIAN:  Noralyn Pick. Eden Emms, MD, FACCDATE OF BIRTH:  1930-09-10   DATE OF CONSULTATION:  DATE OF DISCHARGE:                                 CONSULTATION   PRIMARY CARDIOLOGIST:  Marca Ancona, MD   PRIMARY CARE PHYSICIAN:  Juleen China, MD   PULMONOLOGIST:  Charlcie Cradle. Delford Field, MD, FCCP   REASON FOR CONSULTATION:  Acute on chronic diastolic congestive heart  failure.   HISTORY OF PRESENT ILLNESS:  [name] is a 77 year old female with  multiple medical problems including chronic atrial fibrillation (on  Coumadin); chronic kidney disease with a baseline creatinine of 1.7,  recently up trending to 3.3 today; history of ARDS; COPD; obstructive  sleep apnea, as well as hypertension; diabetes mellitus (HbA1c equal to  7.9); and hyperlipidemia but with no known coronary artery disease  (normal Myoview, May 2010).  The patient recently had ACE inhibitor  discontinued, and her Lasix held due to her up trending creatinine.  Today, she presents with likely pneumonia and volume overloaded  complaining of worsening shortness of breath, productive cough,  worsening orthopnea/dyspnea on exertion, and lower extremity edema.  The  patient denies chest pain at any time.   PAST MEDICAL HISTORY:  1. Type 2 diabetes (HbA1c equal to 7.9%).  2. Obesity.  3. COPD (quit smoking 20 years ago).  4. Diastolic congestive heart failure with EF of 55-60% and mitral      regurgitation from most recent echo, Aug 18, 2008.  5. Chronic atrial fibrillation (on chronic Coumadin) with unsuccessful      TEE-guided cardioversion, May 2010.  6. Chronic kidney disease, baseline creatinine 1.7.  7. History of pneumonia with ARDS in 2005, required tracheostomy and      home O2 at that point.  Went off home O2 for some time but now  back      on again since discharge in April 2010.  8. Jejunal diverticulum microperforation, managed conservatively,      April 2010.  9. Probable obstructive sleep apnea.   SOCIAL HISTORY:  The patient lives in [place] with her husband and  his son.  She is retired.  She has a 10-pack year smoking history,  quitting at least 20 years ago.  She denies any EtOH or illicit drug use  or herbal medication use.  She tries to eat a low salt, low sugar diet.  She is unable to regularly exercise secondary to her comorbidities.   FAMILY HISTORY:  No family history of premature coronary artery disease.   REVIEW OF SYSTEMS:  Questionable recent fevers, the patient has felt  warm; weight increase, question amount; no appetite recently; worsening  of chronic shortness of breath, dyspnea on exertion, orthopnea, PND;  lower extremity edema, as well as palpitations over the last month; also  reports productive cough with yellow/green sputum mild abdominal pain,  otherwise, see HPI.  All other systems reviewed and were negative.   ALLERGIES:  SULFA.   MEDICATIONS:  1. Albuterol 2.5 mg q.8 h.  2. Lasix 40 mg IV q.8 h.  3. Multivitamin daily.  4. Zosyn 2.25 g IV q.6 h.  5. Vancomycin 500 mg IV q.24 h.  6. Warfarin 2.5 mg p.o. daily (per pharmacy).   HOME MEDICATIONS:  Warfarin, Spiriva, omeprazole, hydralazine 50 mg  t.i.d., Toprol-XL 75 mg p.o. daily.   PHYSICAL EXAMINATION:  VITAL SIGNS:  T-max 99.7 degrees Fahrenheit,  current temperature 97.2 degrees Fahrenheit, BP 112/61, pulse 98,  respiration rate 20, O2 saturation 93% on BiPAP with 50% O2, weight is  217 pounds on Sep 05, 2008.  GENERAL:  The patient is alert and oriented x3, in mild distress with  speech, no apparent distress while resting with head of bed at 30  degrees.  HEENT:  Head is normocephalic, atraumatic.  Pupils equal, round, and  reactive to light.  Extraocular muscles are intact.  Nares are patent  without discharge.   Dentition is fair.  Oropharynx without erythema or  exudates.  NECK:  Supple without lymphadenopathy.  No thyromegaly.  Questionable  JVD as the patient is morbidly obese.  HEART:  Irregularly irregular with audible S1 and S2, difficult to  assess for S3, S4/clicks, rubs, murmurs, or gallops due to distant heart  sounds.  Pulses are 2+ and equal in upper extremities and 2+ in the left  lower extremity, and 1+ in the right lower extremity.  LUNGS:  Mild crackles on the left lower side and no breath sounds on the  right lower lobe.  No wheezing noted.  SKIN:  Chronic brawny changes in the lower extremities bilaterally.  ABDOMEN:  Soft.  Right upper quadrant is moderately tender.  Left upper  and lower quadrants mildly tender and normal abdominal bowel sounds.  Mild guarding.  Hepatosplenomegaly and pulsations not assessed secondary  to tenderness.  Abdomen is nondistended.  The patient is morbidly obese.  EXTREMITIES:  No clubbing or cyanosis but 2+ lower extremities  bilaterally, left lower extremity greater than right.  MUSCULOSKELETAL:  No joint deformity or effusions.  No spinal or CVA  tenderness.  NEUROLOGIC:  Cranial nerves II through XII are grossly intact.  Strength  5/5 in all extremities and axial groups.  Normal sensation throughout.  Cerebellar function not assessed.   RADIOLOGY:  The patient had a chest x-ray that showed right lower lobe  atelectasis versus atelectatic pneumonia.  Cardiomegaly without definite  CHF.  The patient had a TEE on Aug 18, 2008, that showed LVEF in the  range of 55-60%.  Mild mitral regurgitation.  Left atrium, mildly to  moderately dilated without evidence of thrombus in the atrial cavity or  appendage.  1. EKG showing a rhythm of atrial fibrillation at a rate of 105,      borderline R-wave progression, otherwise, no acute ST-T wave      changes, no significant Q waves, normal axis, no evidence of      hypertrophy, QRS 84, and QTC 453.  No prior  EKG for comparison.   LABORATORY DATA:  WBC 11.9 with neutrophils at 85%, hemoglobin measured  at 11.2 and 10.2, hematocrit measured at 33.0 and 30.5, PLT count 174.  Sodium 139, potassium 4.5, chloride 104, CO2 30, BUN 79, creatinine 3.3,  glucose 154.  BNP 867.  Blood gas showed a pH of 7.2, PCO2 at 80.5, PO2  at 63, HCO3 31.2.  PT was 35.5, and INR was 3.2.   ASSESSMENT AND PLAN:  This is a 77 year old female with the above  problem list presents with probable right lower lobe pneumonia, as well  as acute on chronic diastolic congestive heart failure in the setting of  acute on  chronic renal failure.  We should watch CO2 on BiPAP, check CO2/ABGs.  We will defer to Renal for the dosing of diuretics.  INR currently  supratherapeutic, likely volume overload affecting liver function.  Will  likely need lower dosing of Coumadin.  Thank you for the consult.  Will  continue to follow.      Jarrett Ables, PAC      Peter C. Eden Emms, MD, [facility]  Electronically Signed    MS/MEDQ  D:  09/11/2008  T:  09/12/2008  Job:  [id number] [id number]

## 2010-08-24 NOTE — Discharge Summary (Signed)
NAME:  [name], [name] NO.:  [id number]   MEDICAL RECORD NO.:  [id number]          PATIENT TYPE:  INP   LOCATION:  2013                         FACILITY:  [facility]   PHYSICIAN:  Gordy Savers, MDDATE OF BIRTH:  08-15-1930   DATE OF ADMISSION:  09/11/2008  DATE OF DISCHARGE:  09/19/2008                               DISCHARGE SUMMARY   DISCHARGE DIAGNOSES:  1. __________.  2. Atrial fibrillation.  3. Acute-on-chronic renal insufficiency.   [name] is a 77 year old __________ female with __________ shortness  of breath.  She has multiple medical problems and was found in the  office to be tachycardic __________.  She was sent to the emergency  department for further evaluation.  She was stabilized in ED on biphasic  positive airway pressure and the hospitalist team was called to admit  her.   PAST MEDICAL HISTORY:  __________ on Coumadin.  Chronic kidney disease with baseline creatinine 1.7.  __________ with ARDS in 2005 requiring tracheostomy.  Microperforation __________ diverticula April 2010 __________.   COURSE OF HOSPITALIZATION:  1. Acute respiratory failure.  The patient was admitted.  She was      placed on BiPAP.  She was seen in consultation by the critical care      medicine service the day of admission.  She was intubated on September 11, 2008, and remained intubated until September 13, 2008.  In addition,      she was seen in consultation by Dr. Terrial Rhodes of the renal      service.  Her baseline creatinine is 1.7 based on previous records.      She was noted to have a creatinine on admission of 2.96, creatinine      at the time of discharge is 2.8.  She was on an Ace inhibitor prior      to admission, which has been discontinued and will continue to be      held at the time of discharge.  2. Acute-on-chronic diastolic heart failure.  The patient was      admitted.  She was also seen in consultation by Dr. Charlton Haws of      [facility]  Cardiology, and was diuresed with IV Lasix.  She is      currently stable on 60 mg of Lasix p.o. b.i.d.  She will need      continued outpatient monitor for heart failure status.  3. Chronic atrial fibrillation.  The patient was started on amiodarone      during this admission.  Her INR remained therapeutic, will need      very close monitoring in setting of Avelox, and we have set her up      with her primary MD on Monday for a followup visit as well as a      PT/INR check.  She is also set up to see Dr. Marca Ancona in the      office at which time her amiodarone will be decreased.  He plans to      reattempt cardioversion on amiodarone in approximately 4 weeks.  4. Pneumonia.  The patient will be treated with antibiotics as well as      her home O2.  She is currently on day #8 of antibiotics.  We will      continue until her followup appointment and defer longer treatment      to her primary MD.  She will be given a prescription for an      additional 6 days of Avelox.   MEDICATIONS AT TIME OF DISCHARGE:  1. Coumadin 5 mg p.o. daily.  2. Spiriva HandiHaler 18 mcg inhalation daily.  3. Multivitamin 1 tab p.o. daily.  4. Amlodipine 5 mg p.o. daily.  5. Lipitor 20 mg p.o. daily.  6. Amiodarone 200 mg p.o. b.i.d.  7. Toprol-XL 25 mg p.o. daily.  8. Avelox 400 mg p.o. daily for 6 additional days.  9. Lasix 60 mg p.o. b.i.d.   DISPOSITION:  The patient will be discharged to home and continued on  home O2 with two liters nasal cannula continuously.  We will defer  placing her back onto a p.r.n. basis to her primary MD at followup.   PERTINENT LABORATORIES AT TIME OF DISCHARGE:  BUN 60, creatinine 2.87,  hemoglobin 10.2, and hematocrit 31.   FOLLOWUP:  She is scheduled to follow up with Dr. Marca Ancona of  [facility] Cardiology on June 23, at 10 o'clock a.m. which was his first  available appointment.  In addition, she is scheduled to follow up with  Dr. Patsy Lager on June 14, at 10:30  a.m.  She is also at that appointment  will need to have her Coumadin, PT/INR evaluated and adjusted.   Greater than 30 minutes spent on discharge planning.      Sandford Craze, NP      Gordy Savers, MD  Electronically Signed    MO/MEDQ  D:  09/19/2008  T:  09/20/2008  Job:  [id number]   cc:   Dr. Patsy Lager.  Marca Ancona, MD

## 2010-08-24 NOTE — Cardiovascular Report (Signed)
NAME:  [name], EISENHUTH             ACCOUNT NO.:  [id number]   MEDICAL RECORD NO.:  [id number]          PATIENT TYPE:  AMB   LOCATION:  ENDO                         FACILITY:  [facility]   PHYSICIAN:  Marca Ancona, MD      DATE OF BIRTH:  [birth year]   DATE OF PROCEDURE:  08/18/2008  DATE OF DISCHARGE:  08/18/2008                            CARDIAC CATHETERIZATION   PROCEDURE:  Direct current cardioversion.   INDICATION:  Atrial fibrillation with diastolic congestive heart  failure.   PROCEDURE NOTE:  After informed consent was obtained, the patient was  sedated using Versed and fentanyl and underwent transesophageal  echocardiogram.  This was a limited study due to desaturation during the  procedure requiring flumazenil and Narcan; however, there was no clot  noted in the left atrial appendage.  After completion of the study,  anesthesia was summoned.  The patient was sedated using a small amount  propofol.  Direct current cardioversion was attempted at 200 joules,  then 200 joules with sternal pressure, and then repeated again 200  joules with sternal pressure; however, the patient remained in atrial  fibrillation.  Her heart rate is well controlled in the 70s.  There were  no complications, and she awoke easily.  We would plan on continuing  current medical management with rate control and Coumadin.  The patient  does need a sleep study to assess for sleep apnea.  If she remains  symptomatic with her fibrillation, we will consider starting her on an  antiarrhythmic medication and trying to repeat the cardioversion.      Marca Ancona, MD  Electronically Signed     DM/MEDQ  D:  08/18/2008  T:  08/19/2008  Job:  [id number]

## 2010-08-24 NOTE — H&P (Signed)
NAME:  TERRALYN, MATSUMURA             ACCOUNT NO.:  [id number]   MEDICAL RECORD NO.:  [id number]          PATIENT TYPE:  INP   LOCATION:  2910                         FACILITY:  [facility]   PHYSICIAN:  Lonia Blood, M.D.       DATE OF BIRTH:  09/20/1930   DATE OF ADMISSION:  09/11/2008  DATE OF DISCHARGE:                              HISTORY & PHYSICAL   PRIMARY CARE PHYSICIAN:  Spencer T. Copland, MD   PRIMARY CARDIOLOGIST:  Marca Ancona, MD   CHIEF COMPLAINT:  Shortness of breath.   HISTORY OF PRESENT ILLNESS:  Ms. Vankleeck is a 77 year old woman with  multiple medical problems who presented to her primary care physician's  office today for shortness of breath.  In the office, she was found to  be tachycardiac and severely hypoxic, so she was sent to the emergency  room for evaluation.  In the emergency room, the patient was stabilized  on biphasic positive airway pressure and we were called to admit her to  hospital.  Currently, the patient seems to be fairly comfortable with  the BiPAP, easily arousable, and denies any chest pain.  Reports that  she did not have any significant fevers or chills prior to this event.  She had a visit to her Cardiologist office on Sep 05, 2008, and was  noted at that time that her creatinine has climbed up to 2.7.  At that  point in time, she was told to hold her furosemide.  The patient reports  that she does not follow a low-salt diet and she actually would eat  anything including ham, cheese bake.   PAST MEDICAL HISTORY:  1. Questionable COPD.  2. Obstructive sleep apnea.  3. Diastolic congestive heart failure.  4. Chronic atrial fibrillation, on Coumadin.  5. Chronic kidney disease with baseline creatinine around 1.7, which      will make her a chronic kidney disease stage IV.  6. Pneumonia with ARDS in 2005, requiring tracheostomy.  7. Micro perforation of a jejunal diverticulum in April 2010.  8. Hypertension.  9. Diabetes mellitus type 2.  10.Hyperlipidemia.   HOME MEDICATIONS:  Coumadin, Spiriva, omeprazole, Toprol, and  hydralazine.   SOCIAL HISTORY:  The patient has a remote history of tobacco use.  Denies drinking or alcohol.  Lives with her husband and son.   FAMILY HISTORY:  Positive for hypertension.   REVIEW OF SYSTEMS:  Difficult to obtain due to the patient's significant  respiratory distress.  All other systems per HPI.  Other systems, again  difficult to review.   PHYSICAL EXAMINATION UPON ADMISSION:  VITAL SIGNS:  Temperature 99.7,  heart rate 94, respiratory rate 22, blood pressure 136/80, and  saturation 86% on room air 99% on biphasic positive airway pressure.  GENERAL:  The patient is arousable and able to talk, but cannot drift  easily to sleep.  She is morbidly obese.  HEENT:  Head appears normocephalic and atraumatic.  Eyes:  Pupils equal,  round and reactive to light and accommodation.  Extraocular movements  are intact.  NECK:  Positive JVD.  CHEST:  Bibasilar crackles.  HEART:  Irregularly irregular, very distant sounds.  ABDOMEN:  Obese and soft.  EXTREMITIES:  Lower extremity with +3 edema.  NEUROLOGIC:  The patient has 5/5 strength in all 4 extremities and her  facial exam does not show any facial droop.   LABORATORY VALUES ON ADMISSION:  Sodium is 140, potassium 4.5, chloride  101, bicarbonate 29, BUN 73, creatinine 3, and calcium 9.  BNP is 867.  White blood cell count is 12, hemoglobin 10, and platelet count 174.  The pH 7.2, pCO2 of 80, pO2 of 63, bicarbonate 31, and INR 3.2.  I have  personally reviewed the portable chest x-ray, which shows marked  cardiomegaly and bilateral infiltrates.   ASSESSMENT AND PLAN:  This is a 77 year old woman presenting with:  1. Acute respiratory failure most likely multifactorial with most      likely the driving force being diastolic congestive heart      exacerbation in the setting of worsening renal failure.  The plan      is to admit Ms.  Ferd Glassing.  Continue her on biphasic positive      pressure, try to aggressively diurese her and place her on empiric      antibiotics.  Pulmonary and Cardiology consultation will be      obtained.  2. Chronic atrial fibrillation.  The patient will be continued on      Coumadin.  3. Diabetes mellitus.  For now, we will use a sliding scale and hold      the other medications.  4. The patient was asked if she would like to receive intubation,      mechanical ventilation, and she elected to be full code and full      resuscitation efforts to be employed.  5. Acute renal failure- unclear cause. no recent NSAIDS or ACEI use.      She is currently volmue overloaded - so we will try diuretics. will      send UA, will check renal ultrasound and follow closely Is and Os -      a nephrology consultation was requested as well.      Lonia Blood, M.D.  Electronically Signed     SL/MEDQ  D:  09/11/2008  T:  09/12/2008  Job:  [id number]   cc:   Juleen China, MD  Marca Ancona, MD

## 2010-08-24 NOTE — Assessment & Plan Note (Signed)
[facility]                            [facility] OFFICE NOTE   NAME:Weatherall, SUPRINA MANDEVILLE                    MRN:          [id number]  DATE:09/05/2008                            DOB:          1930-05-18    PRIMARY CARE PHYSICIAN:  Juleen China, MD   HISTORY OF PRESENT ILLNESS:  This is a 77 year old with history of  obesity, diastolic heart failure, atrial fibrillation, and chronic  kidney disease as well as a history of pneumonia and ARDS, who returns  to Cardiology Clinic for evaluation of acute renal failure.  The patient  did develop atrial fibrillation with rapid ventricular response and  diastolic heart failure while hospitalized at [facility] with a bowel  perforation.  Earlier in the spring, we did anticoagulate her and  attempted to cardiovert her, however, despite 3 attempts with  progressively increasing energy, we were unable to cardiovert her, so  she remained in atrial fibrillation.  Given her significant diastolic  heart failure, we have been diuresing her with Lasix.  We have actually  gotten to the point where she is off oxygen during the day.  Her lower  extremity edema is present, but it is much improved.  We also started  her on a low-dose of an ACE inhibitor.  However, after starting her on  the ACE inhibitor and increasing her Lasix from 60 twice a day to 80  twice a day, her creatinine rose from 1.7 to 2.7; therefore, 2 days ago,  we did stop her lisinopril and held her Lasix.  The patient is actually  feeling fairly good.  She is now back to walking without a walker.  She  is not short of breath when she walks around her house.  When she goes  outside the house to walk for longer distances, she does still become  short of breath.  She is no longer using oxygen during the day.  She is  just using oxygen at night now.  She has had no episodes of chest pain.  We did do an adenosine Myoview on her this week which showed EF of  68%  and normal perfusion.  She has had no recurrences of the chest heaviness  that she had complained about in the last clinic appointment.   Most recent labs May 2010, creatinine 2.7, potassium 4.0.  The  creatinine is up from 1.73 when checked prior.  LDL is 124, HDL 20,  triglycerides 331, BNP 184 which is down from 762 in April 2010.  Hemoglobin A1c was 7.9%   PAST MEDICAL HISTORY:  1. Type 2 diabetes.  The patient is not on any medications for this.      Her hemoglobin A1c has just come back at 7.9% which is elevated.      The patient states that she has been told in the past that she has      had impaired fasting glucose.  2. Obesity.  3. Possible COPD.  The patient did smoke a pack a day of 20 years, but      quit back in the 1970s.  4. Diastolic congestive heart failure.  Most recent echo April 2010      showed mild LVH, EF 55-60%, mild left atrial enlargement, and      mitral regurgitation.  5. Atrial fibrillation.  The first noted episode of atrial      fibrillation was in April 2010, when the patient was in the      hospital for microperforation of the jejunal diverticulum.  The      atrial fibrillation is become persistent.  We did attempt to      cardiovert with TEE guidance in May 2010.  However, this was      unsuccessful.  The patient is rate controlled and is on Coumadin.  6. Chronic kidney disease.  Baseline creatinine seems to be around      1.7.  However, her most recent creatinine was 2.7.  7. History of pneumonia with ARDS in 2005.  The patient actually      required tracheostomy and is on home oxygen at that point, however,      she was off home oxygen since 2005.  After her discharge from [facility] in April 2010, she was again on oxygen probably due to      diastolic heart failure.  However, she is now currently off oxygen      during the day and uses it during the night.  8. Jejunal diverticulum.  The patient had marked perforation that was       managed conservatively in April 2010.  9. Probable obstructive sleep apnea.  When the patient was sedated for      TEE in May 2010, she did develop some upper airway obstruction.  10.Adenosine myoview (5/10):  Normal LV EF and no scar or ischemia      (normal stress).   SOCIAL HISTORY:  The patient smokes about half pack a day for 20 years,  but quit in 1960.  She lives by herself.  She does have a daughter, who  is a Publishing rights manager, who is quite involved in her care.  She does  not drink alcohol.   FAMILY HISTORY:  No family history of premature coronary artery disease.   REVIEW OF SYSTEMS:  All systems were reviewed and were negative except  as per the history of present illness.   PHYSICAL EXAMINATION:  VITAL SIGNS:  Blood pressure today is 116/68,  heart rate is 78 and irregular.  The patient's weight was 208 pounds  today and is down from 217 pounds when I last saw her.  GENERAL:  This is a obese female in no apparent distress.  NEUROLOGICALLY:  Alert and oriented x3.  Normal affect.  LUNGS:  Clear to auscultation bilaterally with normal respiratory  effort.  NECK:  JVP is difficult to discern given the patient's thick neck, but  does not appear to be elevated.  There is no thyromegaly or thyroid  nodule.  CARDIOVASCULAR:  Heart irregular S1 and S2.  No S3.  No S4.  There is no  murmur.  There is 1+ pitting edema about three-quarters in the away to  the knees bilaterally.  Pedal pulses are difficult to palpate due to  edema.  There is no carotid bruit.  ABDOMEN:  Obese, soft, nontender.  No hepatosplenomegaly.  Normal bowel sounds.  EXTREMITIES:  No clubbing or cyanosis.  SKIN:  Normal exam.   MEDICATIONS:  1. Warfarin.  2. Spiriva.  3. Omeprazole.  4. Hydralazine 50 mg 3  times a day.  5. Toprol-XL 75 mg daily.   ASSESSMENT AND PLAN:  This is a 77 year old with history of obesity,  prior adult respiratory distress syndrome, diastolic congestive heart  failure,  persistent atrial fibrillation, and chronic kidney disease, who  presents to Cardiology Clinic for followup of acute renal failure.  1. Atrial fibrillation.  The patient continues in atrial fibrillation      today.  She is well rate controlled.  She is on Coumadin.  We are      going to continue her for now with rate control and on Coumadin.      She is on Toprol-XL 75 mg a day.  She did fail TEE-guided      cardioversion.  2. Diastolic congestive heart failure.  The patient had preserved EF      on echo.  She is doing better now from a symptomatic standpoint.      She is able to walk around in her house without being short of      breath.  She is not using oxygen except at nighttime.  She is no      longer using a walker when she walks around in the house.  Her neck      veins did not appear elevated though she still does have      significant peripheral edema.  Her BNP did improve and her weight      has come down almost 10 pounds since last appointment.  I am going      to go ahead and continue to hold her Lasix for now as her      creatinine had been significantly elevated.  We will have her back      next Tuesday and we will check a Chem-7 at that time and if her      creatinine is improving, we will start her back on Lasix probably      at a lower dose of 40 mg twice a day.  I will keep her off      lisinopril for now.  3. Acute on chronic renal failure.  The patient's creatinine rose from      1.7-2.7.  This was in the setting of significant diuresis and also      starting on an ACE inhibitor.  Her ACE inhibitor has been stopped.      We will wait for her creatinine to come down.  Once it gets down      below about 2 or 2.1, we will start her back on 40 mg twice a day      of Lasix instead of 80 mg twice a day.  We will check her      chemistries next week to see what things look like.  4. Hypertension.  The patient's blood pressure is well-controlled      today.  I had told her  that we are going to try to get her off the      3 times a day hydralazine.  Therefore, we are going to taper her      off the hydralazine over the next couple of days and start her on      Norvasc 5 mg a day.  She will be on Norvasc to replace hydralazine.  5. Pulmonary.  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  She also carries a diagnosis of COPD.  However, she has not smoked since the 1960s.  After we have gotten      her over her acute problems, I will check her PFTs to see what her      lung function looks like.  6. Probable obstructive sleep apnea.  The patient has been scheduled      for a sleep study.  7. Diabetes.  The patient's hemoglobin A1c was 7.9%.  She is going to      see Dr. Patsy Lager on June 4, and we will have her start on diabetes      control medicines at that time.  She has not been on anything in      the past.  8. Coronary artery disease.  The patient did have a negative adenosine      Myoview.  I think the chest heaviness that she had was probably      noncardiac.  9. Hyperlipidemia.  The patient's LDL was 127.  Given the fact that      she has diabetes, I am going to start her on a low dose of statin.      We will begin her on Lipitor 20 mg daily.  10.I will see the patient back in follow up in 2 weeks.     Marca Ancona, MD  Electronically Signed    DM/MedQ  DD: 09/05/2008  DT: 09/06/2008  Job #: [id number]   cc:   Juleen China, MD

## 2010-08-24 NOTE — Assessment & Plan Note (Signed)
[facility]                            [place] OFFICE NOTE   NAME:[name], [name]                    MRN:          [id number]  DATE:08/25/2008                            DOB:          1930-10-29    HISTORY OF PRESENT ILLNESS:  This is a 77 year old with history of  obesity, diastolic heart failure, atrial fibrillation, chronic kidney  disease, and a history of pneumonia and ARDS who returns to Cardiology  Clinic for followup after recent attempt at cardioversion.  The patient  did undergo TEE-directed cardioversion last week at [facility].  Despite three attempts with progressively increasing energy, we were  unable to cardiovert her.  She remained in atrial fibrillation.  She is  in atrial fibrillation today.  We did increase her Lasix.  She actually  said she is feeling better today.  She is less short of breath.  She is  able to walk around in her house now without oxygen without being short  of breath.  She does use her walker when she is outside of the house.  If she were to go longer distance, she would become somewhat short of  breath and she is unable to negotiate steps.  Her lower extremity edema  is still present, but it has decreased.  The patient does report an  episode of chest pain Saturday night.  This happened she thinks several  hours after her last meal.  She has no history of gastroesophageal  reflux.  She did feel a heaviness substernally that lasted for about 30  minutes.  There was mild-to-moderate in intensity and resolved on its  own.  This is not recurred and she has not had chest pain before that.   Most recent labs from May 2010 show hematocrit 39.4, creatinine 1.73  (which is down from 1.85), LDL 124, HDL 20, triglycerides 331, BNP is  184 (that is down from 762 in April 2010).   PAST MEDICAL HISTORY:  1. Type 2 diabetes.  The patient is not on any medications for this.      She denies actual diabetes and  says it is impaired fasting glucose.  2. Obesity.  3. Questionable chronic obstructive pulmonary disease.  The patient      did smoke pack a day for 20 years, but quit back in the 1970s.  4. Diastolic congestive heart failure.  Most recent echo April 2010,      showed mild LVH, EF 55-60%, mild left atrial enlargement.  No      mitral regurgitation.  5. Atrial fibrillation.  The first noted episode of atrial      fibrillation was in April 2010 when the patient was in the hospital      for microperforation of a jejunal diverticulum.  The atrial      fibrillation has become persistent.  We did tend to cardiovert with      TEE guidance in May 2010; however, this was unsuccessful.  The      patient is rate controlled and is on Coumadin.  6. Chronic kidney disease, most recent creatinine 1.73.  7. History of pneumonia with ARDS in 2005.  The patient actually      required tracheostomy and was on home oxygen at that point;      however, she had been off home oxygen since 2005.  She is back on      oxygen again since her discharge in April 2010.  8. Jejunal diverticulum, microperforation managed conservatively in      April 2010.  9. Probable obstructive sleep apnea when the patient was sedated for      TEE in May 2010.  She did develop some airway obstruction.   SOCIAL HISTORY:  The patient has smoked pack a day for 20 years, but  quit in 1960.  She lives by herself.  She does have a daughter who is a  Publishing rights manager, is quite involved in her care.  She does not drink  alcohol.   FAMILY HISTORY:  No family history of premature coronary artery disease.   REVIEW OF SYSTEMS:  All systems reviewed and were negative except as per  the history of present illness.   PHYSICAL EXAMINATION:  VITAL SIGNS:  Blood pressure is 140/77, heart  rate is in the 80s and irregular.  GENERAL:  This is an obese female in no apparent distress.  NEUROLOGIC:  Alert and oriented x3.  Normal affect.  LUNGS:   Clear to auscultation bilaterally with normal respiratory  effort.  NECK:  JVP is difficult to discern given the patient's thick neck.  There is no thyromegaly or thyroid nodule.  CARDIOVASCULAR:  Heart irregular.  S1 and S2.  No S3, no S4.  There is  no murmur.  There is 1+ pitting edema to the knees bilaterally.  Pedal  pulses are difficult to palpate due to edema.  There is no carotid  bruit.  ABDOMEN:  Obese, soft, nontender.  No hepatosplenomegaly.  Normal bowel  sounds.  EXTREMITIES:  No clubbing, cyanosis.  SKIN:  Normal exam.  MUSCULOSKELETAL:  Normal exam.  HEENT:  Normal exam.   ASSESSMENT/PLAN:  This is a 77 year old with history of obesity, prior  ARDS, diastolic congestive heart failure, persistent atrial  fibrillation, and chronic kidney disease who presents to Cardiology  Clinic for followup after a recent attempt at cardioversion.  1. Atrial fibrillation.  The patient continues on atrial fibrillation      today.  She is well rate controlled.  She is on Coumadin.  She does      appear less volume overloaded today than she has been in the past.      For now, as her symptoms are improving, we are going to continue      current management with rate control and Coumadin.  She will      continue on Toprol-XL 75 mg a day.  She did fail TEE-guided      cardioversion.  2. Diastolic congestive heart failure.  The patient had preserved EF      on echo.  She is doing better now from a symptomatic standpoint.      She is able to walk in her house without oxygen without being short      of breath now.  She is short of breath if she walks further      distances outside; however, if she puts her oxygen on, she does      pretty well on flat ground.  She still does appear a bit volume      overloaded.  JVP is difficult to  read due to her thick neck;      however, she does have significant peripheral edema.  Her BNP is      improved compared to April 2010.  Today, we will plan on  increasing      her Lasix just a bit to 80 mg twice a day.  We will have her back      in 1 week and check her CHEM-7.  3. Chronic kidney disease.  Most recent creatinine is 1.73.  As      mentioned, we will check her a CHEM-7 on the increased dose of      Lasix.  4. Hypertension, the patient's blood pressure is 140/77 today, it is      little bit increased.  I am going to try her on a low-dose of ACE      inhibitor.  I will put her on lisinopril 5 mg daily.  As mentioned,      in a week, we will get a CHEM-7.  She can continue on the      hydralazine for now; however, I would like to eventually change it      to a once a day medication.  I do not think the calcium-channel      blocker caused her peripheral edema, as stopping diltiazem did not      completely resolve the edema.  Therefore, I think it would be safe      to have her on Norvasc, so when she comes back again to see [place] in      the office, I am going to take her off the hydralazine and put her      on Norvasc.  I will not do that today because I do not want to make      too many changes on the same day.  5. Pulmonary.  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  She also carries a diagnosis of      chronic obstructive pulmonary disease; however, she has not smoked      since the 1960s.  After she has been more effectively diuresed, I      will check PFTs to see what her lung function looks like.  6. Probable obstructive sleep apnea.  We will set the patient up to      get a sleep study.  The patient did seem to close off her airway      with sedation at the time of her TEE.  7. Probable diabetes.  We will check a hemoglobin A1c and a fasting      glucose the patient is on the medications for diabetes.  I am going      to try to set her up with a primary care physician.  8. Coronary coronary artery disease.  The patient does not have a      history of coronary artery disease;  however, she did have an      episode of chest pain at rest and she does have significant recent      history of diastolic congestive heart failure.  I do think it would      be reasonable to do an adenosine Myoview to assess for significant      ischemia.  We will set her up for this at our [facility] office.  9. Hyperlipidemia.  The patient's LDL is 124.  If she is  confirmed to      have diabetes or an abnormal stress test, we will start her on a      statin.     Marca Ancona, MD  Electronically Signed    DM/MedQ  DD: 08/25/2008  DT: 08/26/2008  Job #: [id number]

## 2010-08-24 NOTE — Consult Note (Signed)
NAME:  [name], [name]             ACCOUNT NO.:  [id number]   MEDICAL RECORD NO.:  [id number]          PATIENT TYPE:  INP   LOCATION:  5530                         FACILITY:  [facility]   PHYSICIAN:  Marcellus Scott, MD     DATE OF BIRTH:  01-16-1931   DATE OF CONSULTATION:  07/24/2008  DATE OF DISCHARGE:                                 CONSULTATION   REQUESTING PHYSICIAN:  Dr. Darnell Level.   PRIMARY MEDICAL DOCTOR:  Dr. Laruth Bouchard of [facility] on [address], [place].   PRIMARY PULMONOLOGIST:  Dr. Shan Levans.   REASON CONSULT REQUESTED:  1. Evaluation of worsening renal failure.  2. Evaluation and management of increasing oxygen requirement.   CHIEF COMPLAINT:  Abdominal pain.   HISTORY OF PRESENT ILLNESS:  [name] is a very pleasant 77 year old  Caucasian female patient with history of severe COPD, primarily  emphysema, who is not on home oxygen, previous history of ventilatory  dependent respiratory failure 5 years ago and hypertension, who was  admitted by the primary service on July 22, 2008, for acute onset of  abdominal pain.  She was evaluated to have perforated jejunum and is  being managed nonsurgically and apparently is improving.  However, on  her lab data apparently her creatinine has been progressively increasing  since admission and also her oxygen requirements have gone up.  The  patient indicates that her abdominal pain actually is much better than  on admission.  She indicates 3/10 diffuse abdominal pain, mainly on  touching her abdomen and is nonradiating.  It is not associated with  nausea or vomiting.  Her last BM was on Sunday, which was 4 days ago.  She passed flatus last night.  The patient denies fever.   Specifically, the patient denies any respiratory symptoms.  She denies  dyspnea, chest pain, cough, chest tightness or wheezing.  She indicates  her breathing is at baseline.  The patient also denies any dysuria.  She  indicates she  has a good urine output.   The patient states she took four 200 mg tablets of Aleve between the  onset of abdominal pain and arrival to the ED.  She was also taking her  antihypertensive medications.  She had decreased oral intake since the  onset of pain to arrival in the hospital and the patient has been nil  per oral since then.  She denies feeling excessively thirsty.   PAST MEDICAL HISTORY:  1. Hypertension.  2. Severe COPD/emphysema, not on home oxygen.  3. History of ventilatory dependent respiratory failure 5 years ago      for double pneumonia.  The patient was then admitted to the [facility].   PAST SURGICAL HISTORY:  Tubal ligation.   ALLERGIES:  THE PATIENT INDICATES THAT WHEN SHE WAS A CHILD SHE HAD SOME  SULFA POWDER PLACED ON HER FOOT FOR A FOOT INFECTION AND THIS  SUBSEQUENTLY GOT WORSE.  SHE DENIES ALLERGIES TO ORAL MEDICATIONS.   HOME MEDICATIONS:  1. Lisinopril/hydrochlorothiazide 20/25 mg p.o. daily.  2. Multivitamins 1 p.o. daily.  3.  Albuterol nebulizations p.r.n.  4. Albuterol inhaler p.r.n.  5. Aleve 200 mg p.r.n.   FAMILY HISTORY:  The patient's mother had history of asthma, otherwise  noncontributory.   SOCIAL HISTORY:  The patient is an ex-smoker.  She indicates she smoked  from age [age] years old and smoked a pack per day.  She has not smoked  since.  She occasionally drinks wine, maybe once a month.  She denies  any recreational drug abuse.  The patient is ambulatory and active at  home.   REVIEW OF SYSTEMS:  A comprehensive 14 system review done and apart from  history of presenting illness is noncontributory.  Specifically, the  patient has not been tested for sleep apnea.  Her husband who is at the  bedside indicates that the patient at times snores, but denies any  apneic spells.  She denies any history of dysuria or frequency.   PHYSICAL EXAMINATION:  GENERAL:  [name] is a moderately built and  morbidly obese female who is  currently sitting on a chair and is in no  obvious distress.  She is quite pleasant.  VITAL SIGNS:  Temperature 98.6 degrees Fahrenheit, pulse 86 per minute.  Regular rate and rhythm.  Respirations 20 per minute, blood pressure  154/74 mmHg.  Oxygen saturation of 94% on 3 liters per minute of oxygen  by nasal cannula.  HEAD:  Nontraumatic, normocephalic.  EYES:  Pupils are equally reacting to light and accommodation.  Bilateral cataracts, the left one seems mature.  ORAL CAVITY/ENT:  Oral mucosa appears dry, but no oropharyngeal erythema  or thrush.  NECK:  Supple, but thick.  No JVD or carotid bruit.  LYMPHATICS:  No lymphadenopathy.  RESPIRATORY:  Distant breath sounds, but apart from occasional crackles  in the bases which clear with coughing, is essentially clear.  CARDIOVASCULAR:  First and second heart sounds heard.  No murmurs.  Regular rate and rhythm.  ABDOMINAL:  Obese.  Mild tenderness in the left upper quadrant.  No  rigidity, guarding or rebound.  Bowel sounds are normally heard.  No  organomegaly or mass appreciated.  CENTRAL NERVOUS SYSTEM:  The patient is awake, alert and oriented x3  with no focal neurological deficits.  EXTREMITIES:  With no cyanosis, clubbing or edema.  Peripheral pulses  are symmetrically felt.  SKIN:  There is some chronic hyperpigmented discrete lesions on the left  lower leg which she indicates is chronic since her hospitalization at  [facility].  MUSCULOSKELETAL:  Unremarkable.   LABORATORY DATA:  Venous lactic acid is 0.7.  Basic metabolic panel  today with BUN 58, creatinine 2.32.  Admitting creatinine was 1.68.  CBCs with hemoglobin 13.4, hematocrit 42, white blood cell 12.1,  platelets 150.  Urinalysis on July 22, 2008, was turbid with moderate  bilirubin, 15 ketones, large amount of blood, 100 mg/dL of protein, 0-2  white blood cells, too numerous to count red blood cells, but rare  bacteria.  INR was 1.2.  CT of the abdomen without  contrast on July 22, 2008,  impression:  A)  Proximal jejunal diverticulosis and diverticulitis with  perforation.  B)  Cholelithiasis.  CT of the pelvis without contrast,  impression:  A)  Sigmoid diverticulosis.  B)  No other acute pelvic  abnormality.  Acute abdominal series which included abdominal x-ray and  chest x-ray, impression:  No acute findings.   ASSESSMENT AND PLAN:  1. Acute nonoliguric renal failure.  Unsure if the patient had an  element of chronic kidney disease prior to hospitalization.  The      etiology of her acute renal failure seems to be secondary to      dehydration secondary to poor oral intake. The other contributory      factors could be her ACE inhibitors and hydrochlorothiazide at home      and use of NSAIDs.  The patient has not received contrast over this      hospitalization.  Also, the patient has significant proteinuria and      hematuria.  Unclear if there is an element of chronic      glomerulonephritis.  I will repeat her urinalysis.  We will hold      her ACE inhibitors, ARBs and diuretics.  Also to avoid nonsteroidal      anti-inflammatory agents and contrast.  The patient is being      aggressively hydrated by the primary team and we agree with the      same.  Will monitor her input and outputs strictly.  We will follow      her daily basic metabolic panel and expect improvement in her renal      functions.  However, if there is deterioration of her creatinine,      consider renal ultrasound to rule out obstructive hydronephrosis      and a nephrology consultation.  2. Severe chronic obstructive pulmonary disease of the emphysematous      type.  The patient is asymptomatic of respiratory symptoms.  We      will titrate oxygen down to maintain saturations between 88-92%.      We will also repeat a chest x-ray.  Continue nebulizations.  There      are no clinical features suggestive of congestive heart failure.      We will also use incentive  spirometer.  If she develops symptoms or      her oxygen requirements do increase, consider further evaluation      such as an ABG.  3. Hypertension, which is fairly controlled without medications.  To      monitor closely.  4. Mild leukocytosis which is secondary to her abdominal pathology.      This is improving.  No clinical features suggestive of sepsis at      this time.  5. Obesity.  6. Perforated jejunum.  Managed conservatively by the primary team and      improving.  7. Deep venous thrombosis and gastrointestinal prophylaxis.     Time spent coordinating this consult was an hour and five minutes.      Marcellus Scott, MD  Electronically Signed     AH/MEDQ  D:  07/24/2008  T:  07/24/2008  Job:  [id number]   cc:   Velora Heckler, MD  Laruth Bouchard, MD  Charlcie Cradle Delford Field, MD, FCCP

## 2010-08-24 NOTE — Discharge Summary (Signed)
NAME:  [name], [name]             ACCOUNT NO.:  [id number]   MEDICAL RECORD NO.:  [id number]          PATIENT TYPE:  INP   LOCATION:  5156                         FACILITY:  [facility]   PHYSICIAN:  Juanetta Gosling, MDDATE OF BIRTH:  February 04, 1931   DATE OF ADMISSION:  07/21/2008  DATE OF DISCHARGE:  08/07/2008                               DISCHARGE SUMMARY   ADMITTING PHYSICIAN:  Amber L. Freida Busman, MD   DISCHARGING PHYSICIAN:  Troy Sine. Dwain Sarna, MD   CONSULTANTS:  1. Coralyn Helling, MD, with Critical Care Medicine, [facility]  2. Luis Abed, MD, [facility], with [facility]   CHIEF COMPLAINT AND REASON FOR ADMISSION:  [name] is a 77 year old  female patient who presented with acute abdominal pain, was found to  have free air on x-ray known to take Aleve periodically for pain.  CT  scan showed jejunal thickening and small pockets of free air.  No  significant amount of fluid or diverticulosis, etc.  White count was  15,900, BUN 57, and creatinine 1.6.  On exam, the patient's abdomen was  distended and tender, right greater than left.  The patient was admitted  by Dr. Freida Busman with the following diagnoses.  1. Abdominal pain, secondary to microperforation jejunum with small      pockets of free air, plans for medical treatment.  2. History of prior pneumonia with ventilator-dependent respiratory      therapy and tracheostomy.  3. Diet-controlled diabetes.  4. Obesity.   HOSPITAL COURSE:  The patient was admitted, placed on n.p.o. status, IV  fluid hydration and was started on empiric antibiotic therapy with plans  to manage this from medical standpoint.  Empiric antibiotics included  Cipro and Flagyl IV.   In an immediate hospitalization, the patient continued to have pain that  slowly decreased.  Her sugars were also elevated at 229.  She is  normally diet controlled.  She continued with leukocytosis.  She was  placed on sliding scale insulin for  diabetic management.  She also had  issues related to persistent acute, possibly a mild chronic renal  insufficiency with creatinines steadily increasing from the 1.8 to the  2.32 range.  Aggressive hydration was initiated and because of the renal  failure and other multiple medical problems including some issues with  mild shortness of breath and O2 requirements, [facility]  was asked to evaluate the patient and manage her medical problems.   By around hospital day 3 or 4, the patient had worsening of her  respiratory failure, rapid response was notified.  The patient was  transferred to the critical care unit because of increasing respiratory  failure and increasing O2 needs.  She was also found to be in atrial  fibrillation with rapid ventricular response.  A Cardiology consult was  warranted and the patient never required intubation and it was felt, her  respiratory failure with a combination of underlying COPD with  exacerbation of volume overload related to ventricular dysfunction due  to atrial fibrillation and rapid ventricular response.  The patient's  initial atrial fibrillation  was treated with IV amiodarone, because she  was n.p.o. due to the abdominal issue, this was later transitioned over  to IV and n.p.o. Cardizem.   From an abdominal standpoint prior to the onset of the respiratory  issues, the patient's abdominal pain had begun to improve.  At one  point, her white count had actually decreased to about 10.6, but  increased after the respiratory events.  Follow up CTs did reveal some  phlegmonous change at the level of the jejunum, but no additional  contrast extravasation.  The patient had had 2 additional CTs since  admission.  These were done while she was in the critical care unit and  then an additional was done after she was transferred to the floor and  diet was initiated.   By hospital day #6, the patient was improved to the point she got   transferred to the step-down then to the telemetry floors.  Her  respiratory status remained relatively stable.  Her diet was slowly  advanced.  Though, she was able to tolerate a low residue diet, this was  eventually advanced to a soft diet.  Also, during the hospitalization  while the patient was on bowel rest, she did require parental nutrition.  Because of hyperglycemia related to this, she was also placed on low-  dose Lantus b.i.d.  The patient normally does not take diabetic  medications in the outpatient setting.   By hospital day 8 to 9, the patient developed additional respiratory  issues and was still requiring O2.  It was determined that in the past,  the patient has gone home on O2 therapy, but as precaution [facility] was  asked to come back and reevaluate the patient.  They reviewed her x-rays  and her clinical status, and although she probably had a loud COPD  exacerbation, there was some concern about possible atelectasis or early  evolving pneumonia, so the patient was kept in the hospital for several  additional days to give her IV Avelox and to monitor for possible  evolution of pneumonia.  Plans were to send the patient home on O2.  Also, a V/Q scan was ordered to rule out PE, this was negative.  She had  mild bilateral pleural effusions, for which Cardiology eventually added  Lasix at discharge.   From a cardiovascular standpoint, the patient was rate controlled on  Cardizem.  Because, she had renal insufficiency, her prior lisinopril  and hydrochlorothiazide was placed on hold.  Apresoline was used for  blood pressure management and as previously mentioned, her Lasix was  added on the date of discharge by Cardiology.  Once the patient was  tolerating a diet without further increases in abdominal pain or  leukocytosis, her Coumadin was initiated on date of discharge.  INR was  therapeutic at 2.1.  The patient's most recent lab work was obtained on  August 06, 2008.   Sodium 141, potassium 4.4, CO2 of 32, BUN 42, and  creatinine 1.85.  White count was 8000 on August 05, 2008.  Prealbumin  26.90 on August 04, 2008.  Hemoglobin A1c was 7.8 on August 04, 2008.  Off  Lantus, the patient's Glucometer in the past 24 hours have ranged from  135-208.  She is still requiring O2 at 2 L/min and is sating 94% and as  previously noted, will go home on O2.  From an abdominal standpoint, she  is tolerating a solid diet without any abdominal pain or fevers or  chills.  FINAL DISCHARGE DIAGNOSES:  1. Abdominal pain, secondary to microperforation of jejunum and      medical management.  2. Mild respiratory failure, multifactorial, secondary to chronic      obstructive pulmonary disease exacerbation, volume overload, and      left ventricular dysfunction, secondary to new onset atrial      fibrillation with rapid ventricular response.  3. New onset atrial fibrillation, this admission, on rate control      therapy.  4. New initiation of systemic Coumadin anticoagulation for atrial      fibrillation.  5. Hypertension, controlled.   DISCHARGE MEDICATIONS:  1. Please stop lisinopril and hydrochlorothiazide until instructed to      change this medication by primary care or Cardiology.  Multiple      vitamin daily.  2. Albuterol and Xopenex nebulizers as prior to admission.   NEW MEDICATIONS:  1. Coumadin 5 mg on 4 days a week Monday, Wednesday, Friday, and      Saturday with 2.5 mg on the remaining 3 days.  2. Cardizem 240 mg daily.  3. Spiriva 18 mcg inhale daily.  4. Apresoline 25 mg t.i.d.  5. Protonix 40 mg daily.  6. Lasix 40 mg daily.   DIET:  Low residue x1 week, then heart-healthy, carb-modified after  that.   WOUND CARE:  Not applicable.   ACTIVITY:  Increase activity slowly.   May walk up steps.  May shower.  The treatment is O2 via nasal cannula  at 2 L/min.  Home health to follow.   On follow up appointment, she need to call our office to see  either Dr.  Freida Busman in 2 weeks.   Cardiology appointment.  You have an appointment on May 17 at 11:15 a.m.  with Dr. Clifton James who assumed care of the patient during this  hospitalization regarding cardiac issues, their telephone number is [phone], he also have an appointment at the [facility] on  Thursday August 07, 2008, at 3:15 p.m.  Since she has not been discharged  until this date, she need to call to reschedule a new appointment.  Call  them to tell the near INR has already been checked today.   The patient does not have a primary care physician and needs to  establish.  She has been given the physician reference number to call to  find a primary care physician to manage her diabetes and hypertension  issues.   OTHER INSTRUCTIONS:  She is to call the surgery office for worsening  abdominal pain, fevers, or chills.      Allison L. Marya Landry, MD  Electronically Signed    ALE/MEDQ  D:  08/07/2008  T:  08/07/2008  Job:  [id number]   cc:   Ardeth Sportsman, MD  Verne Carrow, MD

## 2010-08-24 NOTE — Consult Note (Signed)
NAME:  [name], [name] NO.:  [id number]   MEDICAL RECORD NO.:  [id number]          PATIENT TYPE:  INP   LOCATION:  5156                         FACILITY:  [facility]   PHYSICIAN:  Eduard Clos, MDDATE OF BIRTH:  1930/08/26   DATE OF CONSULTATION:  08/04/2008  DATE OF DISCHARGE:                                 CONSULTATION   Consult called by [facility] for management of shortness  of breath.   CHIEF COMPLAINT:  Shortness of breath on exertion.   HISTORY OF PRESENT ILLNESS:  A 77 year old female with known history of  hypertension and COPD who presented to the ER 2 weeks ago with  significant abdominal pain and was found to have a jejunal perforation  on imaging and was treated conservatively.  The patient subsequently was  found to be in atrial fibrillation, cardiology was consulted, and  medications were adjusted per cardiology.  A 2-D echocardiogram was done  at that time which showed an ejection fraction of 55-60%.  Systolic  function was normal.   Eventually, patient started to tolerate diet but was found the last two  to three days that they were not able to wean her oxygen off, and  patient was noted to get short of breath on exertion.  The patient  denies any chest pain.  Denies any palpitations, dizzy, nausea,  vomiting, abdominal pain, fevers, chills, dysuria, discharge.  Chest x-  ray done on April 23 shows pleural effusion.  We have consulted for  management of her shortness of breath.   PAST MEDICAL HISTORY:  1. Hypertension.  2. COPD.   PAST SURGICAL HISTORY:  Tubal ligation.   MEDICATIONS:  Prior to admission, lisinopril hydrochlorothiazide.  Presently, the patient is on:  1. Albuterol.  2. Dulcolax.  3. __________  4. Ciprofloxacin.  5. Diltiazem 240 mg daily.  6. Lovenox 40 mg subcutaneous q.12 h.  7. Hydralazine 25 mg p.o. t.i.d.  8. NovoLog sliding scale.  9. Lantus 5 units q.12 h.  10.Coumadin.  11.Protonix 40 mg  p.o. daily.   ALLERGIES:  SULFA.   FAMILY HISTORY:  Nothing significant.   SOCIAL HISTORY:  Patient lives with her son.  Denies smoking,  cigarettes, alcohol or drug abuse.   REVIEW OF SYSTEMS:  As in the history of present illness.  Nothing else  significant.   PHYSICAL EXAMINATION:  The patient examined at bedside, not in acute  distress.  VITAL SIGNS:  Blood pressure is 130/60, pulse 90 per minute, temperature  97.5, respirations 18 per minute, O2 saturation 94% on 2 liters and  easily desaturates to 86% on room air.  HEENT:  Anicteric.  No pallor.  CHEST:  Bilateral air entry present.  No rhonchi.  No crepitation.  HEART:  S1-S2 heard.  ABDOMEN:  Soft, nontender.  Bowel sounds heard.  CENTRAL NERVOUS SYSTEM:  Alert, awake, oriented to time, place and  person.  Moves upper and lower extremities.  EXTREMITIES:  +1 edema both lower extremities.  Pulses felt.   LABORATORY DATA:  On August 04, 2008, WBCs 9, hemoglobin 12.5, hematocrit  37.6, platelets 183, neutrophils  77%.  PT/INR 25.1 and 2.1.  CBG 194,  112, 142.  Complete metabolic panel:  Sodium 142, potassium 4.8,  chloride 106, carbon dioxide 31, glucose 108, BUN 43, creatinine 1.7.  Albumin 2.8, calcium 9, phosphorus 5.1, magnesium 1.9.   ASSESSMENT:  1. Shortness of breath with exertion, probably multifactorial.      a.     Mild fluid overload.      b.     Chronic obstructive pulmonary disease.      c.     Deconditioning.  2. Duodenal perforation, conservatively treated.  3. Atrial fibrillation, weight controlled, on Coumadin.  4. Hypertension.  5. Chronic kidney disease.   PLAN:  Will place patient on spirometer and Spiriva.  Will get a dose of  Lasix as the patient is mildly fluid overloaded.  Due to patient having  chronic kidney disease, will get a VQ scan and a Doppler of the lower  extremities and I will get a CAT scan that will further compromise her  kidney function.  Will continue all of her other  medications.  Will  follow strict intake, output and daily weights.  Will check a BMET and  CBC in a.m.  Follow her creatinine closely.  Further recommendations as  condition evolves.   Thanks for involving [place] in the patient's care.  Will follow along with  you.      Eduard Clos, MD  Electronically Signed     ANK/MEDQ  D:  08/04/2008  T:  08/04/2008  Job:  [id number]

## 2010-08-24 NOTE — H&P (Signed)
NAME:  [name], [name] NO.:  [id number]   MEDICAL RECORD NO.:  [id number]          PATIENT TYPE:  EMS   LOCATION:  MAJO                         FACILITY:  [facility]   PHYSICIAN:  Lennie Muckle, MD      DATE OF BIRTH:  1931/03/11   DATE OF ADMISSION:  07/22/2008  DATE OF DISCHARGE:                              HISTORY & PHYSICAL   CHIEF COMPLAINT:  Abdominal pain.   HISTORY OF PRESENT ILLNESS:  [name] is a 77 year old female who had  acute onset of abdominal pain approximately at 2:20 p.m. on Sunday  afternoon.  She said she had eaten lunch and then gone out in sun.  The  pain was described as all over her abdomen.  It was at 10/10 as worse.  She states she took Aleve, felt somewhat better, but then continued to  have severe abdominal pain.  She had nausea and emesis x1.  The pain  persisted.  She went to [facility] yesterday.  She was sent to emergency  room and from [facility].  She says her pain was worse with movement and  coughing.  She has some improved pain in the emergency department.  She  had last received morphine approximately around midnight.  She did have  a CT scan performed, which showed jejunal thickening and small pockets  of free air, no significant amount of fluid, diverticulosis, but no  evidence of diverticulitis in the sigmoid colon.  White count was  elevated at 15.9, hemoglobin/hematocrit 16 and 50.  BUN and creatinine  are elevated at 57 and 1.6.   Past medical history:  pneumonia several years ago with prolonged  hospitalization and tracheostomy   Past surgical history:  tracheostomy   Medications:  see chart   Allergy: None   Social History:  No tobacco use (previous smoker 40 years ago).  NO  ETOH.  Lives with her husband and son.  She walks 3x week around a  track.   ROS: occasional SOB.  no chest pain, urinary problems.  She did have C  diff per her daughter several years ago after her hostpializaton for  pnuemonia.   Physical Examination: (previous dictaion was unretrievable and therfore,  I do not recall the vitals upon admission;  she did have a fever in the  ED).   General:  laying on a stretcher in NAD  HEENT:  EOMI, oral mucosa moist, sclear clear.  Incisional scar on  anterior neck.  Chest:  CTA bilaterally  CVS:  RRR  Abdomen:  distended, tender right>>left.  Extremties/musk: skin changes noted on lower extremities; R>L with  hyperpigmentation.  No pitting edema noted.  Skin:  warm to the touch.   ASSESSMENT AND PLAN:  Perforated jejunum with small pockets of free air.  Clinically, she has improved over the last 12 hours.  I do not think  there is an acute need for surgical intervention at the present time.  I  will be therefore admitting her for n.p.o. status, IV antibiotics, and  observation.  I have discussed with the patient and her daughter is  present in the room.  She does have increased abdominal pain or has  worsening heart rate or hypotension.  This would warrant performing  surgery, but hopefully she will be able to have this seal over without  any surgical intervention.  I will repeat a CBC later today and continue  with IV fluids, Cipro, and Flagyl had been started in the emergency  department.  We will hold her antihypertensives for now.      Lennie Muckle, MD  Electronically Signed     ALA/MEDQ  D:  07/22/2008  T:  07/22/2008  Job:  [id number]

## 2010-08-24 NOTE — Consult Note (Signed)
NAME:  [name], [name] NO.:  [id number]   MEDICAL RECORD NO.:  [id number]          PATIENT TYPE:  INP   LOCATION:  2106                         FACILITY:  [facility]   PHYSICIAN:  Christell Faith, MD   DATE OF BIRTH:  1931/01/23   DATE OF CONSULTATION:  07/25/2008  DATE OF DISCHARGE:                                 CONSULTATION   The patient previously unassigned to Cardiology and will be followed by  Dr. Myrtis Ser with [facility].   REQUESTING PHYSICIAN:  On-call general surgeon.   CHIEF COMPLAINT:  Abdominal pain, now in atrial fibrillation.   HISTORY OF PRESENT ILLNESS:  This is a 77 year old white female who was  admitted on Monday, (today is Friday) with acute onset of severe  abdominal pain.  She was found to have a small amount of free  intraperitoneal air secondary to perforated jejunal diverticuli.  The  surgery team has pursued conservative management with n.p.o. status and  the patient is slowly improving.  She has been kept n.p.o. and has been  passing gas, has not had a bowel movement.  She does still have  significant abdominal pain.  Today, she went into rapid atrial  fibrillation with normal to elevated blood pressure, was placed on a  diltiazem drip and has been in and out of atrial fibrillation throughout  the evening.  According to her family, the patient has been lethargic  and confused this evening.  She is presently denying shortness of breath  or chest pain.  Her family denies past history of arrhythmias.   PAST MEDICAL HISTORY:  1. Severe emphysema.  2. Recurrent pneumonia with a prolonged hospitalization at [facility] in 2005 for ARDS requiring tracheostomy and several months      in the intensive care unit.  3. Hypertension.   SOCIAL HISTORY:  Lives in [place].  She and her husband live with  their son's family.  She quit smoking 40 years ago, prior to that had  approximately 20 pack year history.  She has an occasional glass of  wine  and is fully ambulatory, drives, exercises 3-4 times a week.   FAMILY HISTORY:  Mother had asthma.  Father is deceased, otherwise  noncontributory.   ALLERGIES:  SULFA.   HOME MEDICINES:  1. Lisinopril/hydrochlorothiazide 20/25 mg p.o. daily.  2. Albuterol nebulizer p.r.n.  3. Albuterol MDI p.r.n.  4. Multivitamin daily p.r.n.  5. Aleve p.r.n.   INPATIENT MEDICINES:  1. IV ciprofloxacin.  2. IV Flagyl.  3. Diltiazem drip at 10 mg an hour.  4. Protonix IV.  5. Lovenox 30 mg subcu daily.  6. Albuterol and Atrovent breathing treatments.   REVIEW OF SYSTEMS:  Positive for palpitations, altered mental status,  nausea, vomiting, abdominal pain, and constipation.  The balance of 14  systems are reviewed and negative.   PHYSICAL EXAMINATION:  VITAL SIGNS:  Temperature 98; pulse 136;  respiratory rate 18; blood pressure 191/66; and saturation 88% on  oxygen, facial.  GENERAL:  This is a slightly confused older female.  She is giddy, very  pleasant, in no  acute distress.  Pupils pinpoint.  Mucous membranes  moist.  Dentition fair.  NECK:  Supple.  Neck veins are flat.  No carotid bruits.  No JVD.  No  cervical adenopathy.  CARDIAC:  Rate is tachycardic.  Rhythm, irregularly irregular.  No  murmurs.  Dorsalis pedis 2+ and radial pulses bilaterally.  PULMONARY:  Limited to an anterior exam, sounds clear.  ABDOMEN:  Hyperactive bowel sounds in the upper quadrants.  She does  have prominent peritoneal signs with rebound and guarding.  EXTREMITIES:  No edema or cyanosis.  NEUROLOGIC:  She is oriented to person and place and is actually  oriented to the date, although she does still appear mildly, pleasantly,  confused.   DIAGNOSTIC TESTS:  Abdominal CT on July 22, 2008, multiple small  loculus of free peritoneal air adjacent to perforated proximal jejunal  diverticuli.  Abdominal CT scan, July 25, 2008, reduced amount of gas  adjacent to the jejunum.  No abscess.  Moderate  right-sided pleural  effusion and small left-sided pleural effusion.  Chest x-ray, July 25, 2008, progressive pulmonary edema.  EKG July 25, 2008, rhythm atrial  fibrillation, rate 131 beats per minute.   LABORATORIES:  White blood cell 11.4, hemoglobin 14.7, and platelets  161.  Sodium 137, potassium 4.6, BUN 64, creatinine 2.2, glucose 202,  BNP 743, CK-MB 2.8, and troponin 0.05.  Blood gas; pH 7.26, PaCO2 of 66,  and PaO2 62.   IMPRESSION:  A 77 year old white female with severe emphysema, free  peritoneal air, and intermittent rapid atrial fibrillation with  hypertension.  She has mild altered mental status and CO2 retention  consistent with respiratory acidosis.   PLAN:  1. For respiratory acidosis we would recommend utilizing BiPAP at      night to decrease her CO2 level and raise her pH.  This will likely      help her mental status considerably.  2. Atrial fibrillation.  We would continue the diltiazem drip for now      with a plan to convert to p.o. eventually.  We would also add IV      amiodarone as short term rhythm stabilizing medicine.  We will      check TSH and LFTs at baseline.  Would not like to use this      medicine long-term given her pulmonary status.  We would recommend      full anticoagulation with unfractionated heparin if this is safe      surgically.  This can be addressed in the morning by the surgical      team.  If full anticoagulation is not safe from bleeding      standpoint, we would recommend considering aspirin 325 mg p.o.      daily.  3. Check magnesium level.  4. Check transthoracic echocardiogram.  5. IV Lasix p.r.n. as you are.  6. Her hypertension may be related to her abdominal pain.  Ultimately      if her kidney disease is thought to be chronic, she would probably      benefit from being back on her antihypertensives.  We will defer      this for now.      Christell Faith, MD  Electronically Signed     NDL/MEDQ  D:   07/26/2008  T:  07/26/2008  Job:  [id number]

## 2010-08-24 NOTE — Assessment & Plan Note (Signed)
[facility]                            [place] OFFICE NOTE   NAME:[name], [name]                    MRN:          [id number]  DATE:08/11/2008                            DOB:          03/24/1931    HISTORY OF PRESENT ILLNESS:  This is a 77 year old with history of  diabetes, obesity, diastolic congestive heart failure, atrial  fibrillation, chronic kidney disease, and  history of a pneumonia and  ARDS who presents to Cardiology Clinic for followup after recent  admission to the hospital.  The patient was admitted to [facility] in April 2010 with acute abdominal pain and was found to have  free air on abdominal plain film.  She was then found on CT to have  microperforation of a jejunal diverticulum.  She was managed medically  for this and actually did quite well and is no longer having any  abdominal pain whatsoever.  While in the hospital, she developed atrial  fibrillation with rapid ventricular response as well as diastolic  congestive heart failure.  She was diuresed.  She had a V/Q scan that  was negative.  She remained in atrial fibrillation and was begun on  Coumadin.  She was discharged from the hospital on August 07, 2008.  Since being discharged from the hospital, the patient and her daughter  state that her leg edema has increased considerably.  She has gained 5  pounds since discharge from the hospital.  The day after she went home,  because of her significant leg swelling, her daughter who is a Scientist, water quality, increased her Lasix to 40 mg twice a day.  However, this  has not seemed to make a whole lot of difference.  She is on oxygen  currently.  She had been on home oxygen in the past, however, this was  back in 2005 after her episode of ARDS since then she had been off  oxygen for about 5 years.  Symptomatically, she is short of breath after  walking about 50 feet.  She does have some orthopnea.  She has no PND or  no chest pain.  She is weak.  She does feel like she has made  considerable improvement symptomatically, however, since she was in the  hospital.   EKG was reviewed, shows atrial fibrillation with left anterior  fascicular block, poor anterior R-wave progression.   Most recent labs from April 2010, potassium 4.4, creatinine 1.85, and  BNP 762.   MEDICATIONS:  1. Coumadin.  2. Diltiazem CD 240 mg daily.  3. Lasix 40 mg b.i.d.  4. Spiriva.  5. Hydralazine 25 mg 3 times a day.  6. Protonix 40 mg daily.   PAST MEDICAL HISTORY:  1. Type 2 diabetes.  2. Obesity.  3. Questionable COPD.  The patient did smoke for a pack a day for 20      years, but quit back in the 1960s.  4. Diastolic congestive heart failure.  Most recent echocardiogram in      April 2010 showed mild LVH, EF 55-60%, mild left atrial      enlargement.  No mitral regurgitation.  5. Atrial fibrillation.  The first noted episode of atrial      fibrillation was in April 2010 when the patient was in the hospital      for microperforation of the jejunal diverticulum.  6. Chronic kidney disease, most recent creatinine 1.85.  7. History of pneumonia with ARDS in 2005.  The patient actually      required tracheostomy and was on home oxygen at that point,      however, she had been off home oxygen since 2005.  She is gone back      on oxygen since her discharge in April 2010.  8. Jejunal diverticular microperforation managed conservatively in      4/10.   SOCIAL HISTORY:  The patient smoked a pack a day for 20 years, but quit  in 1960s.  She lives by herself.  She does have a daughter who is a  Publishing rights manager, quite involved in her care.  She does not drink  alcohol.   FAMILY HISTORY:  No family history of premature coronary artery disease.   REVIEW OF SYSTEMS  All systems were reviewed and were negative except as per the HPI.   PHYSICAL EXAMINATION:  VITAL SIGNS:  Blood pressure is 156/75, heart  rate is 70 and  irregular.  GENERAL:  This is an obese female in no apparent distress.  NEUROLOGIC:  Alert and oriented x3.  Normal affect.  LUNGS:  Somewhat distant breath sounds.  There is slight crackles in  bases bilaterally.  Normal respiratory effort.  NECK:  JVP is difficult to discern given the patient's thick neck.  There is no thyromegaly or thyroid nodule.  CARDIOVASCULAR:  Heart irregular, S1 and S2.  No S3.  No S4.  There is  no murmur.  There is a 2+ pitting edema to knees bilaterally.  The pedal  pulses are difficult to palpate due to edema.  There is no carotid  bruits.  ABDOMEN:  Obese, soft, and nontender.  No hepatosplenomegaly.  Normal  bowel sounds.  EXTREMITIES:  No clubbing or cyanosis.  SKIN:  Normal.  MUSCULOSKELETAL:  Normal.  HEENT:  Normal.   ASSESSMENT AND PLAN:  This is a 77 year old with history of obesity,  prior adult respiratory distress syndrome, diastolic congestive heart  failure, new onset of atrial fibrillation and chronic kidney disease who  presents to Cardiology Clinic for followup after recent hospitalization.  1. Atrial fibrillation.  The patient does continue to be in atrial      fibrillation today.  Her heart rate is well controlled in the 70s.      She does appear volume overloaded and has known diastolic      congestive heart failure for which she was treated while she was in      the hospital.  I do think the atrial fibrillation probably does      contribute to her heart failure as this was not an issue for her      prior to her hospitalization.  Also the fluid she received in the      hospital around the time of her admission contributes to this.  The      atrial fibrillation is probably a stress response to her abdominal      process, although she certainly probably had a prior predisposition      to atrial fibrillation.  Given the fact that she does have a      significant congestive heart failure  symptoms and is volume      overloaded, I think  her best course may be to go ahead and      cardiovert her and try to get her back into sinus rhythm.  We will      try to set this up for next Monday.  We will do a TEE cardioversion      as she has not been on therapeutic Coumadin for 4 weeks.  As the      diltiazem may be contributing to her lower extremity swelling, I am      going to stop that and put her on Toprol-XL 75 mg daily.  I do not      think that bronchospasm is a significant part of her lung disease.      She never had history of asthma and quit smoking back in the 1960s.      I think any underlying lung processes she has would probably be due      to fibrosis after ARDS.  The patient will continue her Coumadin.  2. Diastolic congestive heart failure.  The patient had preserved EF      on echo this month with mild left atrial enlargement.  She is      certainly volume overloaded today with New York Heart Association      class III symptoms of shortness of breath after walking of 50 feet.      She is doing somewhat better than she was in the hospital, however,      she still does appear volume overloaded.  It may be that the      diltiazem is leading to some of the peripheral edema.  Her JVP is      very difficult to tell due to her thick neck.  I am going to go      ahead and check a BNP today.  I am also going to stop her diltiazem      and instead put her on Toprol-XL 75 mg daily.  We will call her to      follow up on her blood pressure and her heart rate.  I will      increase her Lasix to 60 mg twice a day.  We will check a Chem-7      today as well.  3. Chronic kidney disease, most recent creatinine is 1.85.  We are      increasing her Lasix today.  We will check a Chem-7.  I will see      her back in the office in 2 weeks.  We will check a Chem-7 prior to      that visit as well.  4. Hypertension.  The patient's blood pressure is elevated today.  I      do not want to put her back on ACE inhibitor as we do not know  what      her kidney function is currently.  I am going to have her to go      ahead and increase her hydralazine to 50 mg 3 times a day today.  5. Pulmonary.  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  She carries a diagnosis of COPD.      However, she has not smoked since the 1960s.  After we have      diuresed her more effectively, I will check PFTs to see what her  lung function looks like.     Marca Ancona, MD  Electronically Signed    DM/MedQ  DD: 08/11/2008  DT: 08/12/2008  Job #: [id number]

## 2010-08-24 NOTE — Consult Note (Signed)
NAME:  [name], HAKEEM NO.:  [id number]   MEDICAL RECORD NO.:  [id number]          PATIENT TYPE:  INP   LOCATION:  2910                         FACILITY:  [facility]   PHYSICIAN:  Terrial Rhodes, M.D.DATE OF BIRTH:  Nov 17, 1930   DATE OF CONSULTATION:  09/11/2008  DATE OF DISCHARGE:                                 CONSULTATION   REASON FOR CONSULTATION:  Acute kidney injury on chronic kidney disease,  respiratory distress, and congestive heart failure.   HISTORY OF PRESENT ILLNESS:  Mrs. Fray is a 77 year old white female  with multiple medical problems most notable for hypertension, morbid  obesity, oxygen-dependent COPD, diabetes mellitus, hypertension, history  of perforated jejunum, and a history of acute kidney injury secondary to  ATN following an admission for a perforated jejunum in the setting of  volume depletion and concomitant ACE inhibitor therapy.  She presented  to her primary care physician's office at [facility] with a 2-  day history of increasing shortness of breath and a cough productive of  yellow sputum.  Oxygen saturations were 76% at that time and given her  history of vent-dependent respiratory failure with pneumonia in the past  and need for a trache in the past, she was referred to [facility] for  further evaluation.  We were asked to see the patient due to worsening  renal function.  Her creatinine was 1.6 on July 21, 2008 at the time of  her admission.  No prior studies are available.  Her creatinine peaked  to 2.32 but decreased to 1.62 after ACE inhibitor was discontinued.  By  the time of discharge, her creatinine was 1.85.  Her trend in BUN and  creatinine is as follows:  On August 06, 2008 BUN 42, creatinine 1.85;  Sep 03, 2008 BUN 96, creatinine 2.7; Sep 08, 2008 BUN 69, creatinine  2.7; September 11, 2008 BUN 79 and creatinine 3.3.  We were asked to see the  patient for further evaluation of her progressive kidney disease  and  underlying chronic kidney disease.  The etiology is unclear.  I will try  to obtain outpatient records preceding her hospitalization in April.   ALLERGIES:  She has allergies to SULFA.   PAST MEDICAL HISTORY:  As per HPI.  1. Morbid obesity.  2. Hypertension.  3. Diabetes mellitus.  4. COPD, oxygen-dependent.  5. Atrial fibrillation with rapid ventricular response and history of      failed cardioversion in the past.  6. Chronic kidney disease.  Baseline creatinine 1.7.  7. History of pneumonia with ARDS in 2005 requiring tracheostomy.  8. Jejunal diverticulum perforation in April 2010.  Continued to be      conservatively treated.  9. Probable obstructive sleep apnea versus obesity hypoventilation      syndrome.  10.Normal LV function.  No ischemia on adenosine Myoview, Aug 18, 2008.   OUTPATIENT MEDICATIONS:  Coumadin, unknown dose.  She is on the sliding  scale, Spiriva, omeprazole, hydralazine 50 mg t.i.d., Toprol-XL 75 mg a  day, Lipitor, and amlodipine.  She does not have a  list or the dosages.  She denies taking lisinopril or diuretics at this time that she had at  her initial presentation on July 22, 2008.   FAMILY HISTORY:  Possible kidney disease in her grandfather but nothing  known specifically.   SOCIAL HISTORY:  She is married, lives in [place] with her husband  and her son's family.  She quit smoking 4 years ago and has a 20-pack  year history prior to that.  Occasional glass of wine and is fully  ambulatory.   REVIEW OF SYSTEMS:  As per HPI.  Over the last 48 hours has malaise and  fatigue and generalized weakness.  CARDIAC:  No chest pain or  palpitations.  PULMONARY:  Has had some shortness of breath, dyspnea on  exertion, and cough productive of yellow sputum.  GI:  No nausea,  vomiting, hematochezia, or bright red blood per rectum.  GU:  No  dysuria, polyuria, hematuria, urgency, frequency, or retention.  RHEUMATOLOGIC:  She has some lower  extremity edema that has been worse  over last 2 days.  All other systems negative.   PHYSICAL EXAMINATION:  GENERAL:  This is a well-developed obese female  lying in bed, wearing a CPAP machine, and appears to be in mild  distress.  VITAL SIGNS:  Blood pressure is 133/68, pulse 101, and respiratory rate  was 25.  HEENT:  Head normocephalic and atraumatic.  Extraocular muscles intact.  No icterus.  Oropharynx could not be examined because of BiPAP mask on  her face.  NECK:  Short and difficult to assess.  No lymphadenopathy or bruits  could be appreciated.  LUNGS:  Have diminished breath sounds at the bases with occasional  rhonchi.  Poor air movement.  CARDIAC:  Irregularly irregular with a heart rate of 101.  ABDOMEN:  Obese and distended.  She had hypoactive bowel sounds and was  tender to deep palpation.  No guarding or rebound.  EXTREMITIES:  Have 1+ pretibial edema bilaterally.   LABORATORY DATA:  Sodium 140, potassium 4.5, chloride 101, CO2 29, BUN  73, creatinine 2.96, glucose 154, and calcium 9.  B-type natriuretic  peptide was 867.  CPK 56, MB 1.5, troponin-I was 0.04, and INR was 3.2.  White blood cell count 11.9, hemoglobin 10.2, and platelets 174.  Chest  x-ray showed right lower lobe atelectasis or pneumonia and cardiomegaly.   ASSESSMENT AND PLAN:  1. Respiratory distress, presumed to be combination of pneumonia and      heart failure.  Pulmonary has been consulted.  She is on BiPAP and      oxygen nebulizers.  Agree with broad-spectrum antibiotics and      continue to follow.  2. Hypercapnic respiratory failure, pCO2 is 80.5.  She is on a BiPAP      as above and pulmonary is following.  3. Acute kidney injury on chronic kidney disease.  It is likely due to      decompensated heart failure in the setting of pneumonia.  It is      unclear if she was taking an ACE inhibitor but according to her      husband that was not on her med list.  Her underlying etiology is       also not clear, as the only creatinine we have in our system dates      to July 22, 2008.  We do not know what her renal function was      prior to this.  Possible  etiologies are chronic kidney disease from      hypertension or hemodynamic effects due to her right-sided heart      failure and volume she has.  Also on differential would be post-      infectious glomerulonephritis given her history of perforated      jejunum and has been treated conservatively.  We will continue to      follow for now.  There is no acute indication for dialysis but      agree with IV Lasix and we will follow closely and educate the      patient about dialysis as she will likely need this in the future.  4. Congestive heart failure, volume overload.  Increase her Lasix to      80 mg IV q.8 and follow her I's and O's and daily weights.  5. Anemia, likely due to chronic kidney disease.  We will check iron      stores and treat with Epogen plus or minus iron.  6. Hypertension.  Blood pressures at goal.  7. Morbid obesity.  8. Atrial fibrillation.  Rate controlled on Coumadin.  9. Diabetes mellitus per Primary Service.  10.Perforated jejunum.  Consider checking a CT scan and see if she has      developed an increased abscess that may be amenable to percutaneous      drainage.   Thank you for this consultation.  We will continue to follow along with  you.           ______________________________  Terrial Rhodes, M.D.     JC/MEDQ  D:  09/11/2008  T:  09/12/2008  Job:  [id number]

## 2010-08-27 NOTE — Assessment & Plan Note (Signed)
[facility]                             PULMONARY OFFICE NOTE   NAME:[name], [name]                    MRN:          [id number]  DATE:06/12/2006                            DOB:          [birth year]    CHIEF COMPLAINT:  Evaluate desaturation.   HISTORY OF PRESENT ILLNESS:  This is a 77 year old white female with a  history of pneumonia x2, the last one 3 years ago.  Then she began 3  weeks ago with a scratchy throat, increased dyspnea, heaviness in the  chest.  Her primary care physician was not available, so she went to  [facility].  They found her to have saturation in the mid 80% range.  Chest x-ray showed slight infiltrate, left lower lobe.  She was treated  with an antibiotic, of which she cannot remember the name.  She was  given prednisone and M.D.I. albuterol inhaler.  She then went to her  primary care physician and placed her on DuoNeb b.i.d. by nebulization  for the last 2 weeks and Asmanex one spray daily.  Since that time, she  is much better.  She is less short of breath.  She is coughing less, and  she is having less symptom complexes.  Currently, she is having no  dyspnea, no cough, no hemoptysis, no chest pain, no acid reflux.  There  is no indigestion.  She denies loss of appetite, weight change,  abdominal pain or difficulty swallowing.  She denies sore throat,  headaches, nasal congestion, sneezing, itching, earache, anxiety.  She  smoked a pack a day for 20 years, but quit smoking 40 years ago.  She is  retired.  She is referred now for further evaluation.   PAST HISTORY:   MEDICAL:  No history of hypertension, heart disease, asthma, emphysema,  diabetes, cholesterol, stroke, cancer or other medical conditions.   SURGICAL:  She has had a tubal ligation.   MEDICATION ALLERGIES:  None.   CURRENT MEDICATIONS:  1. DuoNeb b.i.d. by nebulization.  2. Asmanex one spray daily.  3. Lasix daily; she cannot remember the dose.  4.  Multivitamins daily.  5. Vitamin C daily.   SOCIAL HISTORY:  Smoking history as noted above.  She is retired.  She  lives at home with her husband.   FAMILY HISTORY:  Asthma in her mother; otherwise, noncontributory.   PHYSICAL EXAMINATION:  VITAL SIGNS:  Blood pressure 180/100, pulse 77,  weight 212 pounds, temperature 98.3, saturation 92% on room air.  GENERAL:  This is an obese elderly female in no distress.  CHEST:  Clear with distant breath sounds.  No wheezing, rale or rhonchi.  CARDIAC:  A regular rate and rhythm without S3.  Normal S1 and S2.  ABDOMEN:  Soft, nontender.  EXTREMITIES:  No edema or clubbing.  SKIN:  Clear.  NEUROLOGIC:  Intact.  HEENT:  Nares were clear.  Oropharynx was clear.  NECK:  Supple.   LABORATORY DATA:  Spirometry was obtained today in the office, revealing  severe obstructive defect, an FEV1 of 48% predicted, FVC of 61%  predicted, FEV1:FVC ratio of  78%.  The patient ambulates around the  office and drops to 88%, but no lower after 3 laps.   CHEST X-RAY:  A chest x-ray was obtained and reviewed and revealed  resolving left lower lobe infiltrate, COPD changes.   IMPRESSION:  Severe chronic obstructive lung disease with primary  emphysematous component and chronic air flow obstruction with previous  history of smoking and documented airflow obstruction at this time.   RECOMMENDATIONS FOR THIS PATIENT:  Maintain DuoNeb on a b.i.d. dosing  basis and Asmanex one spray daily.  Proper instructions to the  medications were given.  No additional therapies were prescribed, and no  additional oxygen therapy is necessary.  Will see the patient back in  follow up in 6 weeks.     Charlcie Cradle Delford Field, MD, [facility]  Electronically Signed    PEW/MedQ  DD: 06/13/2006  DT: 06/14/2006  Job #: [id number]   cc:   Aida Puffer

## 2010-09-07 ENCOUNTER — Ambulatory Visit (INDEPENDENT_AMBULATORY_CARE_PROVIDER_SITE_OTHER): Payer: Medicare Other | Admitting: Family Medicine

## 2010-09-07 DIAGNOSIS — Z7901 Long term (current) use of anticoagulants: Secondary | ICD-10-CM

## 2010-09-07 DIAGNOSIS — Z5181 Encounter for therapeutic drug level monitoring: Secondary | ICD-10-CM

## 2010-09-07 DIAGNOSIS — I4891 Unspecified atrial fibrillation: Secondary | ICD-10-CM

## 2010-09-07 LAB — POCT INR: INR: 2.9

## 2010-09-07 NOTE — Patient Instructions (Signed)
Continue current dose and return in 4 weeks.  

## 2010-09-17 ENCOUNTER — Encounter: Payer: Self-pay | Admitting: Cardiovascular Disease

## 2010-09-17 ENCOUNTER — Encounter: Payer: Self-pay | Admitting: Cardiology

## 2010-09-24 ENCOUNTER — Ambulatory Visit: Payer: Medicare Other | Admitting: Cardiology

## 2010-10-14 ENCOUNTER — Ambulatory Visit: Payer: Self-pay | Admitting: Family Medicine

## 2010-10-14 ENCOUNTER — Other Ambulatory Visit: Payer: Self-pay | Admitting: Family Medicine

## 2010-10-19 ENCOUNTER — Ambulatory Visit (INDEPENDENT_AMBULATORY_CARE_PROVIDER_SITE_OTHER): Payer: Medicare Other | Admitting: Family Medicine

## 2010-10-19 DIAGNOSIS — I4891 Unspecified atrial fibrillation: Secondary | ICD-10-CM

## 2010-10-19 DIAGNOSIS — Z7901 Long term (current) use of anticoagulants: Secondary | ICD-10-CM

## 2010-10-19 DIAGNOSIS — Z5181 Encounter for therapeutic drug level monitoring: Secondary | ICD-10-CM

## 2010-10-19 LAB — POCT INR: INR: 2.7

## 2010-10-19 NOTE — Patient Instructions (Signed)
Continue current dose, check in 4 weeks  

## 2010-11-05 ENCOUNTER — Other Ambulatory Visit: Payer: Self-pay | Admitting: Cardiology

## 2010-11-15 ENCOUNTER — Other Ambulatory Visit: Payer: Self-pay | Admitting: Family Medicine

## 2010-11-16 ENCOUNTER — Ambulatory Visit (INDEPENDENT_AMBULATORY_CARE_PROVIDER_SITE_OTHER): Payer: Medicare Other | Admitting: Family Medicine

## 2010-11-16 DIAGNOSIS — I4891 Unspecified atrial fibrillation: Secondary | ICD-10-CM

## 2010-11-16 DIAGNOSIS — Z7901 Long term (current) use of anticoagulants: Secondary | ICD-10-CM

## 2010-11-16 DIAGNOSIS — Z5181 Encounter for therapeutic drug level monitoring: Secondary | ICD-10-CM

## 2010-12-06 ENCOUNTER — Other Ambulatory Visit: Payer: Self-pay | Admitting: Cardiology

## 2010-12-14 ENCOUNTER — Ambulatory Visit: Payer: Medicare Other | Admitting: Family Medicine

## 2010-12-14 DIAGNOSIS — Z7901 Long term (current) use of anticoagulants: Secondary | ICD-10-CM

## 2010-12-14 DIAGNOSIS — Z5181 Encounter for therapeutic drug level monitoring: Secondary | ICD-10-CM

## 2010-12-14 DIAGNOSIS — I4891 Unspecified atrial fibrillation: Secondary | ICD-10-CM

## 2010-12-14 NOTE — Patient Instructions (Signed)
Continue current dose, check in 4 weeks  

## 2010-12-23 ENCOUNTER — Ambulatory Visit: Payer: Medicare Other | Admitting: Family Medicine

## 2010-12-29 ENCOUNTER — Ambulatory Visit: Payer: Self-pay | Admitting: Family Medicine

## 2011-01-04 ENCOUNTER — Ambulatory Visit (INDEPENDENT_AMBULATORY_CARE_PROVIDER_SITE_OTHER): Payer: Medicare Other | Admitting: Family Medicine

## 2011-01-04 ENCOUNTER — Encounter: Payer: Self-pay | Admitting: Family Medicine

## 2011-01-04 DIAGNOSIS — Z7901 Long term (current) use of anticoagulants: Secondary | ICD-10-CM

## 2011-01-04 DIAGNOSIS — J4489 Other specified chronic obstructive pulmonary disease: Secondary | ICD-10-CM

## 2011-01-04 DIAGNOSIS — I4891 Unspecified atrial fibrillation: Secondary | ICD-10-CM

## 2011-01-04 DIAGNOSIS — E785 Hyperlipidemia, unspecified: Secondary | ICD-10-CM

## 2011-01-04 DIAGNOSIS — Z5181 Encounter for therapeutic drug level monitoring: Secondary | ICD-10-CM

## 2011-01-04 DIAGNOSIS — M1990 Unspecified osteoarthritis, unspecified site: Secondary | ICD-10-CM

## 2011-01-04 DIAGNOSIS — J449 Chronic obstructive pulmonary disease, unspecified: Secondary | ICD-10-CM

## 2011-01-04 DIAGNOSIS — Z79899 Other long term (current) drug therapy: Secondary | ICD-10-CM

## 2011-01-04 DIAGNOSIS — E119 Type 2 diabetes mellitus without complications: Secondary | ICD-10-CM

## 2011-01-04 DIAGNOSIS — Z23 Encounter for immunization: Secondary | ICD-10-CM

## 2011-01-04 DIAGNOSIS — E039 Hypothyroidism, unspecified: Secondary | ICD-10-CM

## 2011-01-04 DIAGNOSIS — N189 Chronic kidney disease, unspecified: Secondary | ICD-10-CM

## 2011-01-04 DIAGNOSIS — I10 Essential (primary) hypertension: Secondary | ICD-10-CM

## 2011-01-04 LAB — CBC WITH DIFFERENTIAL/PLATELET
Basophils Absolute: 0 10*3/uL (ref 0.0–0.1)
Eosinophils Absolute: 0.1 10*3/uL (ref 0.0–0.7)
Hemoglobin: 10 g/dL — ABNORMAL LOW (ref 12.0–15.0)
Lymphocytes Relative: 9.8 % — ABNORMAL LOW (ref 12.0–46.0)
Lymphs Abs: 1.4 10*3/uL (ref 0.7–4.0)
MCHC: 31.5 g/dL (ref 30.0–36.0)
Monocytes Relative: 5.7 % (ref 3.0–12.0)
Neutro Abs: 11.6 10*3/uL — ABNORMAL HIGH (ref 1.4–7.7)
Platelets: 182 10*3/uL (ref 150.0–400.0)
RDW: 16.3 % — ABNORMAL HIGH (ref 11.5–14.6)

## 2011-01-04 LAB — T3, FREE: T3, Free: 2.1 pg/mL — ABNORMAL LOW (ref 2.3–4.2)

## 2011-01-04 LAB — TSH: TSH: 3.64 u[IU]/mL (ref 0.35–5.50)

## 2011-01-04 LAB — POCT INR: INR: 2.4

## 2011-01-04 LAB — HEPATIC FUNCTION PANEL
AST: 17 U/L (ref 0–37)
Albumin: 3.4 g/dL — ABNORMAL LOW (ref 3.5–5.2)
Alkaline Phosphatase: 63 U/L (ref 39–117)
Total Bilirubin: 0.7 mg/dL (ref 0.3–1.2)

## 2011-01-04 LAB — T4, FREE: Free T4: 0.98 ng/dL (ref 0.60–1.60)

## 2011-01-04 MED ORDER — IPRATROPIUM-ALBUTEROL 18-103 MCG/ACT IN AERO: 2.0000 | INHALATION_SPRAY | Freq: Four times a day (QID) | RESPIRATORY_TRACT | Status: DC

## 2011-01-04 NOTE — Patient Instructions (Signed)
Continue 2 mg daily, recheck 4 weeks 

## 2011-01-04 NOTE — Patient Instructions (Signed)
F/u 6 months

## 2011-01-04 NOTE — Progress Notes (Signed)
Subjective:    Patient ID: [name], female    DOB: 1931/03/19, 78 y.o.   MRN: [id number]  HPI  [name], a 77 y.o. female presents today in the office for the following:    F/u elderly lady with oxygen-dependent COPD, atrial fibrillation on chronic Coumadin, diastolic heart failure, hypothyroidism, and multiple other comorbidities. She generally is doing well, and really has no complaints and is feeling good  COPD: Advair, 2 L O2 Spiriva? --- stopped spiriva. 300 dollars a month. Try combivent She had to stop her Spiriva due to financial issues, but she is breathing well, has not had any difficulty. She is not having any shortness of breath at baseline. She is able to cook her own food, but her son and daughter-in-law do the cleaning. She is able to walk to the porch and at least down to the mailbox  Hypothyroid: recheck labs needed  ? DM - check labs - the patient has had some elevated blood sugars in the past. Not clear this is a correct diagnosis. I want to recheck a hemoglobin A1c as well as checking a random blood sugar today to verify this. Do all future  HTN: Tolerating all medications without side effects Stable and at goal No CP, no sob. No HA.  BP Readings from Last 3 Encounters:  01/04/11 130/84  07/20/10 120/70  06/18/10 122/70    Basic Metabolic Panel:    Component Value Date/Time   NA 144 07/20/2010 1214   K 4.4 07/20/2010 1214   CL 103 07/20/2010 1214   CO2 32 07/20/2010 1214   BUN 67* 07/20/2010 1214   CREATININE 2.8* 07/20/2010 1214   CREATININE 2.65* 06/18/2010 1715   GLUCOSE 152* 07/20/2010 1214   CALCIUM 9.0 07/20/2010 1214   CKD at baseline, sees Neph  Lipids: Doing well, stable. Tolerating meds fine with no SE. Panel reviewed with patient.  Lipids:    Component Value Date/Time   CHOL 260* 07/23/2009 0000   TRIG 406* 07/23/2009 0000   HDL 23* 07/23/2009 0000   VLDL NOT CALC mg/dL 07/18/8117 1478   CHOLHDL 11.3 Ratio 07/23/2009 0000     Lab Results  Component Value Date   ALT 21 03/15/2010   AST 18 03/15/2010   ALKPHOS 52 03/15/2010   BILITOT 0.3 03/15/2010   Patient Active Problem List  Diagnoses  . HYPOTHYROIDISM  . DIABETES MELLITUS, TYPE II  . OVERWEIGHT/OBESITY  . OBSTRUCTIVE SLEEP APNEA  . HYPERTENSION, UNSPECIFIED  . ATRIAL FIBRILLATION  . DIASTOLIC HEART FAILURE, CHRONIC  . COPD  . RENAL FAILURE, CHRONIC  . SLEEP APNEA  . HYPERLIPIDEMIA-MIXED  . Fatigue  . Osteoarthritis   Past Medical History  Diagnosis Date  . Diabetes mellitus   . Obesity   . COPD (chronic obstructive pulmonary disease)     Moderate. PFTs (12/10): FVC 67%, FEV1 58%, ratio 60%, TLC 95%, RV 138%, DLCO 43% (moderate obstructive defect). She is on home oxygen that should be worn at all times.  . Diastolic CHF, chronic     Echo (3/12): EF 55-60%, mild LV hypertrophy, grade I diastolic dysfunction, mild left atrial enlargement, normal pulmonary artery pressure  . CKD (chronic kidney disease)     Cr 2.4 when last checked. 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 PNA (pneumonia)     h/o with ARDS in 2005; MRSA PNA with prolonged hospitalization in [facility] 11/11  . Diverticulum     Jejunal  . OSA (obstructive  sleep apnea)     possible. When pt was sedated for TEE in May 2010, she did develop some upper airway obstruction  . Anemia     of chronic disease  . Hypothyroidism   . Gout   . Atrial fibrillation 07/2008    Failed cardioversion with TEE guidance in May 2010. Pt went back to NSR by 11/2008 with amiodarne   Past Surgical History  Procedure Date  . Tracheostomy     ARDS/ ICU admission, trach 2005  . Microperforation 08/2008    Of jejunem, ICU admission   History  Substance Use Topics  . Smoking status: Former Smoker -- 1.0 packs/day for 23 years    Quit date: 04/11/1968  . Smokeless tobacco: Not on file   Comment: Started at age 37 (quit at age 65)  . Alcohol Use: No   Family  History  Problem Relation Age of Onset  . Emphysema Mother     and asthma  . Asthma Mother   . Heart disease Neg Hx     premature   Allergies  Allergen Reactions  . Sulfonamide Derivatives     REACTION: Reaction not known   Current Outpatient Prescriptions on File Prior to Visit  Medication Sig Dispense Refill  . albuterol (VENTOLIN HFA) 108 (90 BASE) MCG/ACT inhaler Inhale 2 puffs into the lungs every 6 (six) hours as needed.        Marland Kitchen amLODipine (NORVASC) 10 MG tablet TAKE ONE TABLET BY MOUTH EVERY DAY  30 tablet  6  . fenofibrate (TRICOR) 48 MG tablet Take 48 mg by mouth daily.        . Fluticasone-Salmeterol (ADVAIR DISKUS) 250-50 MCG/DOSE AEPB Inhale 1 puff into the lungs every 12 (twelve) hours.        . furosemide (LASIX) 40 MG tablet Take 40 mg by mouth 2 (two) times daily.        Marland Kitchen levalbuterol (XOPENEX) 0.63 MG/3ML nebulizer solution Take 1 ampule by nebulization every 4 (four) hours as needed.        Marland Kitchen levothyroxine (SYNTHROID, LEVOTHROID) 25 MCG tablet Take 25 mcg by mouth daily.        . magnesium oxide (MAG-OX) 400 MG tablet Take 400 mg by mouth 3 (three) times daily.        . metoprolol tartrate (LOPRESSOR) 25 MG tablet Take 25 mg by mouth 2 (two) times daily.        . NON FORMULARY OXYGEN. 2 liters at bedtime and as needed during the day.       . ranitidine (ZANTAC) 150 MG capsule Take 150 mg by mouth every evening.        . simvastatin (ZOCOR) 40 MG tablet Take 40 mg by mouth at bedtime.        Marland Kitchen warfarin (COUMADIN) 2 MG tablet TAKE ONE TABLET BY MOUTH EVERY DAY AS DIRECTED  30 tablet  5  . warfarin (COUMADIN) 5 MG tablet Take 5 mg by mouth as directed.        . warfarin (COUMADIN) 6 MG tablet Take 6 mg by mouth as directed.        Marland Kitchen PACERONE 200 MG tablet TAKE ONE TABLET BY MOUTH EVERY DAY  30 each  6     Review of Systems As above. Doing well. No chest pain. She does have some venous stasis disease at baseline. Chest viral and walker. She is not really having any  significant pain now.    Objective:  Physical Exam   Physical Exam  Blood pressure 130/84, pulse 59, temperature 97.8 F (36.6 C), temperature source Oral, height 5\' 4"  (1.626 m), weight 209 lb 1.9 oz (94.856 kg), SpO2 91.00%.  GEN: WDWN, NAD, Non-toxic, A & O x 3 HEENT: Atraumatic, Normocephalic. Neck supple. No masses, No LAD. Ears and Nose: No external deformity. CV: RRR, No M/G/R. Appears regular today PULM: CTA B, no wheezes, crackles, rhonchi. No retractions. No resp. distress. No accessory muscle use. ABD: S, NT, ND, +BS. No rebound tenderness. No HSM.  EXTR: 1 + LE edema with venous stasis changes NEURO Normal gait.  PSYCH: Normally interactive. Conversant. Not depressed or anxious appearing.  Calm demeanor.        Assessment & Plan:   1. HYPOTHYROIDISM  TSH, T3, free, T4, free  2. DIABETES MELLITUS, TYPE II - check to sure this is correct Hemoglobin A1c  3. HYPERLIPIDEMIA-MIXED  Hepatic function panel, LDL cholesterol, direct  4. HYPERTENSION, UNSPECIFIED    5. Encounter for long-term (current) use of other medications  Basic metabolic panel, CBC with Differential  6. Osteoarthritis    7. Need for prophylactic vaccination and inoculation against influenza  Flu vaccine greater than or equal to 3yo preservative free IM  8. COPD  albuterol-ipratropium (COMBIVENT) 18-103 MCG/ACT inhaler  9. RENAL FAILURE, CHRONIC -- seeing neph     The patient does need a rolling walker. Exam best interest to have a rolling walker for ambulatory reasons. She is on oxygen at all times due to COPD, she also has congestive heart failure atrial fibrillation and arthritis. It is reasonable for her to have the type of rolling walker that has a seat for intermittent resting. This is my suggestion. Dispense #1 with a length of need of 99 years.

## 2011-01-05 ENCOUNTER — Telehealth: Payer: Self-pay | Admitting: *Deleted

## 2011-01-05 DIAGNOSIS — N189 Chronic kidney disease, unspecified: Secondary | ICD-10-CM

## 2011-01-05 LAB — BASIC METABOLIC PANEL
BUN: 79 mg/dL — ABNORMAL HIGH (ref 6–23)
CO2: 32 mEq/L (ref 19–32)
Calcium: 8.8 mg/dL (ref 8.4–10.5)
GFR: 13.68 mL/min — CL (ref >60.00)
Glucose, Bld: 115 mg/dL — ABNORMAL HIGH (ref 70–99)

## 2011-01-05 LAB — LDL CHOLESTEROL, DIRECT: Direct LDL: 71.4 mg/dL

## 2011-01-05 NOTE — Telephone Encounter (Signed)
Lab called with critical GFR on patient- 13.68.

## 2011-01-05 NOTE — Telephone Encounter (Signed)
Patient advised and appt made

## 2011-01-05 NOTE — Telephone Encounter (Signed)
Have her hold lasix today and tomorrow.  Kidney function up a little bit. Let's make sure not due to dehydration and overuse of diuretics.  (baseline cr 2.8) And recheck BMP on Friday morning - set up lab

## 2011-01-07 ENCOUNTER — Other Ambulatory Visit (INDEPENDENT_AMBULATORY_CARE_PROVIDER_SITE_OTHER): Payer: Medicare Other

## 2011-01-07 DIAGNOSIS — N189 Chronic kidney disease, unspecified: Secondary | ICD-10-CM

## 2011-01-07 LAB — BASIC METABOLIC PANEL
Calcium: 9.4 mg/dL (ref 8.4–10.5)
GFR: 15.37 mL/min — ABNORMAL LOW (ref >60.00)
Sodium: 143 mEq/L (ref 135–145)

## 2011-01-11 ENCOUNTER — Ambulatory Visit: Payer: Medicare Other

## 2011-01-11 ENCOUNTER — Ambulatory Visit: Payer: Medicare Other | Admitting: Family Medicine

## 2011-01-20 ENCOUNTER — Other Ambulatory Visit: Payer: Self-pay | Admitting: Cardiology

## 2011-01-20 MED ORDER — SIMVASTATIN 20 MG PO TABS: 20.0000 mg | ORAL_TABLET | Freq: Every day | ORAL | Status: DC

## 2011-01-20 NOTE — Telephone Encounter (Signed)
Rx refill request. Medication is not within our pt med list but said was prescribed by your office.

## 2011-01-20 NOTE — Telephone Encounter (Signed)
Please let pt know that i refilled her cholesterol medicine (Zocor/simvistatin) but lowered the dose to 20 mg. I did this based on some newer information that came out suggesting lower doses with some of the medicine she is on.

## 2011-01-20 NOTE — Telephone Encounter (Signed)
Patient advised.

## 2011-02-10 ENCOUNTER — Telehealth: Payer: Self-pay

## 2011-02-10 ENCOUNTER — Ambulatory Visit (INDEPENDENT_AMBULATORY_CARE_PROVIDER_SITE_OTHER): Payer: Medicare Other | Admitting: Family Medicine

## 2011-02-10 DIAGNOSIS — Z5181 Encounter for therapeutic drug level monitoring: Secondary | ICD-10-CM

## 2011-02-10 DIAGNOSIS — Z7901 Long term (current) use of anticoagulants: Secondary | ICD-10-CM

## 2011-02-10 DIAGNOSIS — I4891 Unspecified atrial fibrillation: Secondary | ICD-10-CM

## 2011-02-10 NOTE — Telephone Encounter (Signed)
Form from social security dropped off for completion. Placed in Dr Brayton Layman in box.

## 2011-02-10 NOTE — Telephone Encounter (Signed)
i will evaluate when next in the office.

## 2011-02-10 NOTE — Patient Instructions (Signed)
Hold tomorrow and sat (has already taken todays dose) then take 2 mg Sunday, recheck Monday

## 2011-02-13 ENCOUNTER — Other Ambulatory Visit: Payer: Self-pay | Admitting: Cardiology

## 2011-02-14 ENCOUNTER — Ambulatory Visit (INDEPENDENT_AMBULATORY_CARE_PROVIDER_SITE_OTHER): Payer: Medicare Other | Admitting: Family Medicine

## 2011-02-14 ENCOUNTER — Encounter: Payer: Self-pay | Admitting: Family Medicine

## 2011-02-14 VITALS — BP 130/80 | HR 50 | Temp 96.8°F

## 2011-02-14 DIAGNOSIS — Z5181 Encounter for therapeutic drug level monitoring: Secondary | ICD-10-CM

## 2011-02-14 DIAGNOSIS — R21 Rash and other nonspecific skin eruption: Secondary | ICD-10-CM

## 2011-02-14 DIAGNOSIS — Z7901 Long term (current) use of anticoagulants: Secondary | ICD-10-CM

## 2011-02-14 DIAGNOSIS — J4489 Other specified chronic obstructive pulmonary disease: Secondary | ICD-10-CM

## 2011-02-14 DIAGNOSIS — N189 Chronic kidney disease, unspecified: Secondary | ICD-10-CM

## 2011-02-14 DIAGNOSIS — I4891 Unspecified atrial fibrillation: Secondary | ICD-10-CM

## 2011-02-14 DIAGNOSIS — J449 Chronic obstructive pulmonary disease, unspecified: Secondary | ICD-10-CM

## 2011-02-14 DIAGNOSIS — I872 Venous insufficiency (chronic) (peripheral): Secondary | ICD-10-CM

## 2011-02-14 LAB — POCT INR: INR: 3.5

## 2011-02-14 MED ORDER — ALBUTEROL SULFATE HFA 108 (90 BASE) MCG/ACT IN AERS: 2.0000 | INHALATION_SPRAY | RESPIRATORY_TRACT | Status: DC | PRN

## 2011-02-14 MED ORDER — TRIAMCINOLONE ACETONIDE 0.1 % EX CREA: TOPICAL_CREAM | Freq: Two times a day (BID) | CUTANEOUS | Status: DC

## 2011-02-14 MED ORDER — TIOTROPIUM BROMIDE MONOHYDRATE 18 MCG IN CAPS: 18.0000 ug | ORAL_CAPSULE | Freq: Every day | RESPIRATORY_TRACT | Status: DC

## 2011-02-14 MED ORDER — PREDNISONE 20 MG PO TABS: ORAL_TABLET | ORAL | Status: DC

## 2011-02-14 MED ORDER — MOMETASONE FURO-FORMOTEROL FUM 200-5 MCG/ACT IN AERO: 2.0000 | INHALATION_SPRAY | Freq: Two times a day (BID) | RESPIRATORY_TRACT | Status: DC

## 2011-02-14 MED ORDER — ALBUTEROL SULFATE (2.5 MG/3ML) 0.083% IN NEBU: 2.5000 mg | INHALATION_SOLUTION | Freq: Four times a day (QID) | RESPIRATORY_TRACT | Status: DC | PRN

## 2011-02-14 NOTE — Progress Notes (Signed)
Subjective:    Patient ID: [name], female    DOB: 1930/07/17, 77 y.o.   MRN: [id number]  HPI  77 yo female with multiple med probs presents:  All of the sudden in the last week has felt like she cannot breath well. Feeling SOB. No cough. Not movin around well. Normally using walker. Ran out of spiriva.  Out of albuteral Historically the patient has been noncompliant. She is on oxygen, and she is supposed to be wearing this at all times. She is currently taking do where at 2 puffs twice daily. At this time, she is on neither Spiriva nor Combivent. She also ran out of her albuterol nebulizer solution, and she also has no albuterol HFA's. She has not been febrile, not coughing, not producing any sputum. She generally just feels short of breath.  Walking from bedroom to kitchen.   Chronic renal failure. Basic Metabolic Panel:    Component Value Date/Time   NA 143 01/07/2011 1141   K 4.9 01/07/2011 1141   CL 103 01/07/2011 1141   CO2 30 01/07/2011 1141   BUN 61* 01/07/2011 1141   CREATININE 3.1* 01/07/2011 1141   CREATININE 2.65* 06/18/2010 1715   GLUCOSE 114* 01/07/2011 1141   CALCIUM 9.4 01/07/2011 1141    Ms. Regan recent creatinine is 3.1. This is trended upwards over the last 1-2 years. She has historically been seen by [facility]. They have discussed dialysis with her in the past. Apparently, in our discussion today, the patient decided that she was angry with her physician there and has not been back recently.  Venous stasis disease, lower extremities with rash, chronic.  Patient Active Problem List  Diagnoses  . HYPOTHYROIDISM  . DIABETES MELLITUS, TYPE II  . OVERWEIGHT/OBESITY  . OBSTRUCTIVE SLEEP APNEA  . HYPERTENSION, UNSPECIFIED  . ATRIAL FIBRILLATION  . DIASTOLIC HEART FAILURE, CHRONIC  . COPD  . RENAL FAILURE, CHRONIC  . SLEEP APNEA  . HYPERLIPIDEMIA-MIXED  . Fatigue  . Osteoarthritis  . Venous reflux   Past Medical History  Diagnosis Date    . Diabetes mellitus   . Obesity   . COPD (chronic obstructive pulmonary disease)     Moderate. PFTs (12/10): FVC 67%, FEV1 58%, ratio 60%, TLC 95%, RV 138%, DLCO 43% (moderate obstructive defect). She is on home oxygen that should be worn at all times.  . Diastolic CHF, chronic     Echo (3/12): EF 55-60%, mild LV hypertrophy, grade I diastolic dysfunction, mild left atrial enlargement, normal pulmonary artery pressure  . CKD (chronic kidney disease)     Cr 2.4 when last checked. 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 PNA (pneumonia)     h/o with ARDS in 2005; MRSA PNA with prolonged hospitalization in [facility] regional 11/11  . Diverticulum     Jejunal  . OSA (obstructive sleep apnea)     possible. When pt was sedated for TEE in May 2010, she did develop some upper airway obstruction  . Anemia     of chronic disease  . Hypothyroidism   . Gout   . Atrial fibrillation 07/2008    Failed cardioversion with TEE guidance in May 2010. Pt went back to NSR by 11/2008 with amiodarne   Past Surgical History  Procedure Date  . Tracheostomy     ARDS/ ICU admission, trach 2005  . Microperforation 08/2008    Of jejunem, ICU admission   History  Substance Use Topics  . Smoking status:  Former Smoker -- 1.0 packs/day for 23 years    Quit date: 04/11/1968  . Smokeless tobacco: Not on file   Comment: Started at age 63 (quit at age 22)  . Alcohol Use: No   Family History  Problem Relation Age of Onset  . Emphysema Mother     and asthma  . Asthma Mother   . Heart disease Neg Hx     premature   Allergies  Allergen Reactions  . Sulfonamide Derivatives     REACTION: Reaction not known   Current Outpatient Prescriptions on File Prior to Visit  Medication Sig Dispense Refill  . amLODipine (NORVASC) 10 MG tablet TAKE ONE TABLET BY MOUTH EVERY DAY  30 tablet  6  . fenofibrate (TRICOR) 48 MG tablet Take 48 mg by mouth daily.        . furosemide (LASIX) 40 MG  tablet Take 40 mg by mouth 2 (two) times daily.        Marland Kitchen levalbuterol (XOPENEX) 0.63 MG/3ML nebulizer solution Take 1 ampule by nebulization every 4 (four) hours as needed.        Marland Kitchen levothyroxine (SYNTHROID, LEVOTHROID) 25 MCG tablet Take 25 mcg by mouth daily.        . magnesium oxide (MAG-OX) 400 MG tablet Take 400 mg by mouth 3 (three) times daily.        . metoprolol tartrate (LOPRESSOR) 25 MG tablet TAKE ONE TABLET BY MOUTH TWICE DAILY  60 tablet  5  . NON FORMULARY OXYGEN. 2 liters at bedtime and as needed during the day.       . ranitidine (ZANTAC) 150 MG capsule Take 150 mg by mouth every evening.        . simvastatin (ZOCOR) 20 MG tablet Take 1 tablet (20 mg total) by mouth at bedtime.  90 tablet  3  . warfarin (COUMADIN) 2 MG tablet TAKE ONE TABLET BY MOUTH EVERY DAY AS DIRECTED  30 tablet  5  . warfarin (COUMADIN) 5 MG tablet Take 5 mg by mouth as directed.        . warfarin (COUMADIN) 6 MG tablet Take 6 mg by mouth as directed.           Review of Systems ROS: GEN: No acute illnesses, no fevers, chills. GI: No n/v/d, eating normally Pulm: + SOB as above Interactive and getting along well at home. Otherwise, the pertinent positives and negatives are listed above and in the HPI, otherwise a full review of systems has been reviewed and is negative unless noted positive.      Objective:   Physical Exam   Physical Exam  Blood pressure 130/80, pulse 50, temperature 96.8 F (36 C), temperature source Tympanic, SpO2 92.00%.  GEN: WDWN, NAD, Non-toxic, A & O x 3 HEENT: Atraumatic, Normocephalic. Neck supple. No masses, No LAD. Ears and Nose: No external deformity. CV: RRR, No M/G/R. No JVD. No thrill. No extra heart sounds. PULM: Decreased breath sounds diffusely. No crackles. No increased work of breathing. Mild wheezing. ABD: S, NT, ND, +BS. No rebound tenderness. No HSM.  EXTR: diffuse venous stasis changes NEURO Normal gait.  PSYCH: Normally interactive. Conversant. Not  depressed or anxious appearing.  Calm demeanor.        Assessment & Plan:   1. COPD  predniSONE (DELTASONE) 20 MG tablet  2. RENAL FAILURE, CHRONIC    3. Venous reflux    4. Rash      >40 minutes spent in face to face  time with patient, >50% spent in counselling or coordination of care: The patient is having an acute COPD exacerbation. Prednisone burst and taper. Filled out paperwork for her to obtain Spiriva. Also given albuterol and albuterol nebulizers. Triamcinolone for rash secondary to venous reflux. Additionally, additional time was spent in discussion regarding chronic kidney disease. The patient did not want to go back to nephrology. We discussed end-of-life care, chronic kidney disease, and that it is overall not a good thing that her creatinine has been increasing ever steadily over the last year. I recommended that she followup with her nephrologist for assistance with long term care. Also recommended that she discuss her end-of-life care and chronic kidney disease with her family. She is accompanied today with her nurse practitioner daughter, who fully understands this topic.

## 2011-02-14 NOTE — Patient Instructions (Signed)
Hold tonight then 2 mg daily, recheck 1 week

## 2011-02-22 ENCOUNTER — Ambulatory Visit (INDEPENDENT_AMBULATORY_CARE_PROVIDER_SITE_OTHER): Payer: Medicare Other | Admitting: Family Medicine

## 2011-02-22 DIAGNOSIS — Z5181 Encounter for therapeutic drug level monitoring: Secondary | ICD-10-CM

## 2011-02-22 DIAGNOSIS — I4891 Unspecified atrial fibrillation: Secondary | ICD-10-CM

## 2011-02-22 DIAGNOSIS — Z7901 Long term (current) use of anticoagulants: Secondary | ICD-10-CM

## 2011-02-22 NOTE — Patient Instructions (Signed)
2 mg daily, 1 mg Tu/Th. recheck 1 week

## 2011-02-28 ENCOUNTER — Ambulatory Visit (INDEPENDENT_AMBULATORY_CARE_PROVIDER_SITE_OTHER): Payer: Medicare Other | Admitting: Family Medicine

## 2011-02-28 DIAGNOSIS — Z5181 Encounter for therapeutic drug level monitoring: Secondary | ICD-10-CM

## 2011-02-28 DIAGNOSIS — Z7901 Long term (current) use of anticoagulants: Secondary | ICD-10-CM

## 2011-02-28 DIAGNOSIS — I4891 Unspecified atrial fibrillation: Secondary | ICD-10-CM

## 2011-02-28 LAB — POCT INR: INR: 3.9

## 2011-02-28 NOTE — Patient Instructions (Signed)
Hold today then resume the 2 mg daily, 1 mg Tu/Th. recheck 1 week

## 2011-03-07 ENCOUNTER — Ambulatory Visit (INDEPENDENT_AMBULATORY_CARE_PROVIDER_SITE_OTHER): Payer: Medicare Other | Admitting: Family Medicine

## 2011-03-07 DIAGNOSIS — I4891 Unspecified atrial fibrillation: Secondary | ICD-10-CM

## 2011-03-07 DIAGNOSIS — Z7901 Long term (current) use of anticoagulants: Secondary | ICD-10-CM

## 2011-03-07 DIAGNOSIS — Z5181 Encounter for therapeutic drug level monitoring: Secondary | ICD-10-CM

## 2011-03-07 NOTE — Patient Instructions (Signed)
Continue current dose, check in 4 weeks  

## 2011-03-28 ENCOUNTER — Other Ambulatory Visit: Payer: Self-pay | Admitting: Cardiology

## 2011-03-30 NOTE — Telephone Encounter (Signed)
Should get from PCP or provider that manages her hypothroidism

## 2011-04-02 ENCOUNTER — Inpatient Hospital Stay (HOSPITAL_COMMUNITY)
Admission: EM | Admit: 2011-04-02 | Discharge: 2011-04-19 | DRG: 871 | Disposition: A | Discharge status: Other Acute Inpatient Hospital | Payer: Medicare Other | Attending: Internal Medicine | Admitting: Internal Medicine

## 2011-04-02 ENCOUNTER — Encounter (HOSPITAL_COMMUNITY): Payer: Self-pay | Admitting: *Deleted

## 2011-04-02 ENCOUNTER — Emergency Department (HOSPITAL_COMMUNITY): Payer: Medicare Other

## 2011-04-02 ENCOUNTER — Other Ambulatory Visit: Payer: Self-pay

## 2011-04-02 DIAGNOSIS — N184 Chronic kidney disease, stage 4 (severe): Secondary | ICD-10-CM | POA: Diagnosis present

## 2011-04-02 DIAGNOSIS — I4891 Unspecified atrial fibrillation: Secondary | ICD-10-CM | POA: Diagnosis present

## 2011-04-02 DIAGNOSIS — R5381 Other malaise: Secondary | ICD-10-CM | POA: Diagnosis not present

## 2011-04-02 DIAGNOSIS — R001 Bradycardia, unspecified: Secondary | ICD-10-CM | POA: Diagnosis present

## 2011-04-02 DIAGNOSIS — I5032 Chronic diastolic (congestive) heart failure: Secondary | ICD-10-CM | POA: Diagnosis present

## 2011-04-02 DIAGNOSIS — I129 Hypertensive chronic kidney disease with stage 1 through stage 4 chronic kidney disease, or unspecified chronic kidney disease: Secondary | ICD-10-CM | POA: Diagnosis present

## 2011-04-02 DIAGNOSIS — R040 Epistaxis: Secondary | ICD-10-CM | POA: Diagnosis not present

## 2011-04-02 DIAGNOSIS — G4733 Obstructive sleep apnea (adult) (pediatric): Secondary | ICD-10-CM | POA: Diagnosis present

## 2011-04-02 DIAGNOSIS — M109 Gout, unspecified: Secondary | ICD-10-CM | POA: Diagnosis present

## 2011-04-02 DIAGNOSIS — N390 Urinary tract infection, site not specified: Secondary | ICD-10-CM | POA: Diagnosis present

## 2011-04-02 DIAGNOSIS — J9620 Acute and chronic respiratory failure, unspecified whether with hypoxia or hypercapnia: Secondary | ICD-10-CM | POA: Diagnosis not present

## 2011-04-02 DIAGNOSIS — N189 Chronic kidney disease, unspecified: Secondary | ICD-10-CM | POA: Diagnosis present

## 2011-04-02 DIAGNOSIS — K219 Gastro-esophageal reflux disease without esophagitis: Secondary | ICD-10-CM | POA: Diagnosis present

## 2011-04-02 DIAGNOSIS — E46 Unspecified protein-calorie malnutrition: Secondary | ICD-10-CM | POA: Diagnosis not present

## 2011-04-02 DIAGNOSIS — K8020 Calculus of gallbladder without cholecystitis without obstruction: Secondary | ICD-10-CM | POA: Diagnosis present

## 2011-04-02 DIAGNOSIS — N179 Acute kidney failure, unspecified: Secondary | ICD-10-CM | POA: Clinically undetermined

## 2011-04-02 DIAGNOSIS — Z794 Long term (current) use of insulin: Secondary | ICD-10-CM

## 2011-04-02 DIAGNOSIS — A419 Sepsis, unspecified organism: Secondary | ICD-10-CM | POA: Diagnosis present

## 2011-04-02 DIAGNOSIS — I498 Other specified cardiac arrhythmias: Secondary | ICD-10-CM | POA: Diagnosis present

## 2011-04-02 DIAGNOSIS — A0472 Enterocolitis due to Clostridium difficile, not specified as recurrent: Secondary | ICD-10-CM | POA: Diagnosis not present

## 2011-04-02 DIAGNOSIS — J4489 Other specified chronic obstructive pulmonary disease: Secondary | ICD-10-CM | POA: Diagnosis present

## 2011-04-02 DIAGNOSIS — R509 Fever, unspecified: Secondary | ICD-10-CM | POA: Diagnosis present

## 2011-04-02 DIAGNOSIS — E1151 Type 2 diabetes mellitus with diabetic peripheral angiopathy without gangrene: Secondary | ICD-10-CM | POA: Diagnosis present

## 2011-04-02 DIAGNOSIS — Z87891 Personal history of nicotine dependence: Secondary | ICD-10-CM

## 2011-04-02 DIAGNOSIS — Z825 Family history of asthma and other chronic lower respiratory diseases: Secondary | ICD-10-CM

## 2011-04-02 DIAGNOSIS — E875 Hyperkalemia: Secondary | ICD-10-CM | POA: Diagnosis not present

## 2011-04-02 DIAGNOSIS — K56609 Unspecified intestinal obstruction, unspecified as to partial versus complete obstruction: Secondary | ICD-10-CM | POA: Diagnosis present

## 2011-04-02 DIAGNOSIS — K5712 Diverticulitis of small intestine without perforation or abscess without bleeding: Secondary | ICD-10-CM | POA: Diagnosis present

## 2011-04-02 DIAGNOSIS — J449 Chronic obstructive pulmonary disease, unspecified: Secondary | ICD-10-CM | POA: Diagnosis present

## 2011-04-02 DIAGNOSIS — K560 Paralytic ileus: Secondary | ICD-10-CM | POA: Diagnosis present

## 2011-04-02 DIAGNOSIS — I509 Heart failure, unspecified: Secondary | ICD-10-CM | POA: Diagnosis present

## 2011-04-02 DIAGNOSIS — R0902 Hypoxemia: Secondary | ICD-10-CM | POA: Diagnosis present

## 2011-04-02 DIAGNOSIS — J439 Emphysema, unspecified: Secondary | ICD-10-CM | POA: Diagnosis present

## 2011-04-02 DIAGNOSIS — R109 Unspecified abdominal pain: Secondary | ICD-10-CM

## 2011-04-02 DIAGNOSIS — K630 Abscess of intestine: Secondary | ICD-10-CM | POA: Diagnosis present

## 2011-04-02 DIAGNOSIS — J189 Pneumonia, unspecified organism: Secondary | ICD-10-CM | POA: Diagnosis present

## 2011-04-02 DIAGNOSIS — R1031 Right lower quadrant pain: Secondary | ICD-10-CM | POA: Diagnosis present

## 2011-04-02 DIAGNOSIS — Z79899 Other long term (current) drug therapy: Secondary | ICD-10-CM

## 2011-04-02 DIAGNOSIS — Z882 Allergy status to sulfonamides status: Secondary | ICD-10-CM

## 2011-04-02 DIAGNOSIS — E039 Hypothyroidism, unspecified: Secondary | ICD-10-CM | POA: Diagnosis present

## 2011-04-02 DIAGNOSIS — G9341 Metabolic encephalopathy: Secondary | ICD-10-CM | POA: Diagnosis not present

## 2011-04-02 DIAGNOSIS — E782 Mixed hyperlipidemia: Secondary | ICD-10-CM | POA: Diagnosis present

## 2011-04-02 DIAGNOSIS — E119 Type 2 diabetes mellitus without complications: Secondary | ICD-10-CM | POA: Diagnosis present

## 2011-04-02 DIAGNOSIS — E860 Dehydration: Secondary | ICD-10-CM

## 2011-04-02 DIAGNOSIS — J9811 Atelectasis: Secondary | ICD-10-CM | POA: Diagnosis not present

## 2011-04-02 DIAGNOSIS — I959 Hypotension, unspecified: Secondary | ICD-10-CM | POA: Diagnosis present

## 2011-04-02 DIAGNOSIS — R6510 Systemic inflammatory response syndrome (SIRS) of non-infectious origin without acute organ dysfunction: Secondary | ICD-10-CM | POA: Diagnosis present

## 2011-04-02 HISTORY — DX: Pneumonia, unspecified organism: J18.9

## 2011-04-02 HISTORY — DX: Gastro-esophageal reflux disease without esophagitis: K21.9

## 2011-04-02 HISTORY — DX: Peripheral vascular disease, unspecified: I73.9

## 2011-04-02 HISTORY — DX: Essential (primary) hypertension: I10

## 2011-04-02 HISTORY — DX: Chronic kidney disease, unspecified: N18.9

## 2011-04-02 LAB — CBC
HCT: 36.6 % (ref 36.0–46.0)
Hemoglobin: 11.1 g/dL — ABNORMAL LOW (ref 12.0–15.0)
MCH: 27.2 pg (ref 26.0–34.0)
MCV: 89.7 fL (ref 78.0–100.0)
RBC: 4.08 MIL/uL (ref 3.87–5.11)
WBC: 9.3 10*3/uL (ref 4.0–10.5)

## 2011-04-02 LAB — COMPREHENSIVE METABOLIC PANEL
ALT: 15 U/L (ref 0–35)
Alkaline Phosphatase: 85 U/L (ref 39–117)
BUN: 79 mg/dL — ABNORMAL HIGH (ref 6–23)
CO2: 24 mEq/L (ref 19–32)
Calcium: 9.8 mg/dL (ref 8.4–10.5)
GFR calc Af Amer: 16 mL/min — ABNORMAL LOW (ref >90)
GFR calc non Af Amer: 14 mL/min — ABNORMAL LOW (ref >90)
Glucose, Bld: 177 mg/dL — ABNORMAL HIGH (ref 70–99)
Potassium: 5.9 mEq/L — ABNORMAL HIGH (ref 3.5–5.1)
Sodium: 134 mEq/L — ABNORMAL LOW (ref 135–145)
Total Protein: 6.9 g/dL (ref 6.0–8.3)

## 2011-04-02 LAB — DIFFERENTIAL
Eosinophils Absolute: 0.1 10*3/uL (ref 0.0–0.7)
Eosinophils Relative: 1 % (ref 0–5)
Lymphocytes Relative: 10 % — ABNORMAL LOW (ref 12–46)
Lymphs Abs: 0.9 10*3/uL (ref 0.7–4.0)
Monocytes Relative: 3 % (ref 3–12)

## 2011-04-02 LAB — POCT I-STAT, CHEM 8
Calcium, Ion: 1.14 mmol/L (ref 1.12–1.32)
Creatinine, Ser: 3 mg/dL — ABNORMAL HIGH (ref 0.50–1.10)
Glucose, Bld: 180 mg/dL — ABNORMAL HIGH (ref 70–99)
HCT: 38 % (ref 36.0–46.0)
Hemoglobin: 12.9 g/dL (ref 12.0–15.0)
TCO2: 29 mmol/L (ref 0–100)

## 2011-04-02 LAB — URINALYSIS, ROUTINE W REFLEX MICROSCOPIC
Bilirubin Urine: NEGATIVE
Nitrite: NEGATIVE
Protein, ur: NEGATIVE mg/dL
Specific Gravity, Urine: 1.017 (ref 1.005–1.030)
Urobilinogen, UA: 0.2 mg/dL (ref 0.0–1.0)

## 2011-04-02 LAB — OCCULT BLOOD, POC DEVICE: Fecal Occult Bld: NEGATIVE

## 2011-04-02 LAB — TROPONIN I: Troponin I: <0.3 ng/mL (ref <0.30)

## 2011-04-02 LAB — LACTIC ACID, PLASMA: Lactic Acid, Venous: 1.7 mmol/L (ref 0.5–2.2)

## 2011-04-02 MED ORDER — SIMVASTATIN 20 MG PO TABS
20.0000 mg | ORAL_TABLET | Freq: Every day | ORAL | Status: DC
  Administered 2011-04-03 – 2011-04-06 (×4): 20 mg via ORAL
  Filled 2011-04-02 (×5): qty 1

## 2011-04-02 MED ORDER — SODIUM CHLORIDE 0.9 % IV SOLN
INTRAVENOUS | Status: AC
  Filled 2011-04-02: qty 100

## 2011-04-02 MED ORDER — INSULIN ASPART 100 UNIT/ML ~~LOC~~ SOLN
5.0000 [IU] | Freq: Once | SUBCUTANEOUS | Status: AC
  Administered 2011-04-02: 5 [IU] via SUBCUTANEOUS
  Filled 2011-04-02: qty 1

## 2011-04-02 MED ORDER — ONDANSETRON HCL 4 MG/2ML IJ SOLN
4.0000 mg | Freq: Once | INTRAMUSCULAR | Status: AC
  Administered 2011-04-02: 4 mg via INTRAVENOUS
  Filled 2011-04-02: qty 2

## 2011-04-02 MED ORDER — ONDANSETRON HCL 4 MG/2ML IJ SOLN
4.0000 mg | Freq: Four times a day (QID) | INTRAMUSCULAR | Status: DC | PRN
  Administered 2011-04-05 – 2011-04-06 (×2): 4 mg via INTRAVENOUS
  Filled 2011-04-02 (×2): qty 2

## 2011-04-02 MED ORDER — SODIUM CHLORIDE 0.9 % IV SOLN
INTRAVENOUS | Status: DC
  Administered 2011-04-02 – 2011-04-05 (×4): via INTRAVENOUS

## 2011-04-02 MED ORDER — FENOFIBRATE 54 MG PO TABS
54.0000 mg | ORAL_TABLET | Freq: Every day | ORAL | Status: DC
  Administered 2011-04-03 – 2011-04-07 (×5): 54 mg via ORAL
  Filled 2011-04-02 (×5): qty 1

## 2011-04-02 MED ORDER — HYDROMORPHONE HCL PF 1 MG/ML IJ SOLN
0.5000 mg | Freq: Once | INTRAMUSCULAR | Status: AC
  Administered 2011-04-02: 0.5 mg via INTRAVENOUS
  Filled 2011-04-02: qty 1

## 2011-04-02 MED ORDER — SODIUM CHLORIDE 0.9 % IV SOLN
1.0000 g | Freq: Once | INTRAVENOUS | Status: AC
  Administered 2011-04-02: 1 g via INTRAVENOUS

## 2011-04-02 MED ORDER — ALBUTEROL SULFATE (5 MG/ML) 0.5% IN NEBU
2.5000 mg | INHALATION_SOLUTION | Freq: Four times a day (QID) | RESPIRATORY_TRACT | Status: DC
  Administered 2011-04-03 (×2): 2.5 mg via RESPIRATORY_TRACT
  Filled 2011-04-02 (×2): qty 0.5

## 2011-04-02 MED ORDER — TIOTROPIUM BROMIDE MONOHYDRATE 18 MCG IN CAPS
18.0000 ug | ORAL_CAPSULE | Freq: Every day | RESPIRATORY_TRACT | Status: DC
  Administered 2011-04-03 – 2011-04-04 (×2): 18 ug via RESPIRATORY_TRACT
  Filled 2011-04-02: qty 5

## 2011-04-02 MED ORDER — DEXTROSE 5 % IV SOLN
1.0000 g | Freq: Once | INTRAVENOUS | Status: AC
  Administered 2011-04-02: 1 g via INTRAVENOUS
  Filled 2011-04-02: qty 10

## 2011-04-02 MED ORDER — CALCIUM GLUCONATE 10 % IV SOLN
INTRAVENOUS | Status: AC
  Filled 2011-04-02: qty 10

## 2011-04-02 MED ORDER — DEXTROSE 50 % IV SOLN
25.0000 mL | Freq: Once | INTRAVENOUS | Status: AC
  Administered 2011-04-02: 25 mL via INTRAVENOUS
  Filled 2011-04-02: qty 50

## 2011-04-02 MED ORDER — ACETAMINOPHEN 325 MG PO TABS: 650.0000 mg | ORAL_TABLET | Freq: Four times a day (QID) | ORAL | Status: DC | PRN

## 2011-04-02 MED ORDER — MOMETASONE FURO-FORMOTEROL FUM 200-5 MCG/ACT IN AERO: 2.0000 | INHALATION_SPRAY | Freq: Two times a day (BID) | RESPIRATORY_TRACT | Status: DC

## 2011-04-02 MED ORDER — ALBUTEROL SULFATE (5 MG/ML) 0.5% IN NEBU
2.5000 mg | INHALATION_SOLUTION | RESPIRATORY_TRACT | Status: DC | PRN
  Administered 2011-04-06: 2.5 mg via RESPIRATORY_TRACT
  Filled 2011-04-02: qty 0.5

## 2011-04-02 MED ORDER — ONDANSETRON HCL 4 MG PO TABS
4.0000 mg | ORAL_TABLET | Freq: Four times a day (QID) | ORAL | Status: DC | PRN
  Administered 2011-04-04: 4 mg via ORAL
  Filled 2011-04-02: qty 1

## 2011-04-02 MED ORDER — ALBUTEROL SULFATE HFA 108 (90 BASE) MCG/ACT IN AERS
2.0000 | INHALATION_SPRAY | RESPIRATORY_TRACT | Status: DC | PRN
  Filled 2011-04-02: qty 6.7

## 2011-04-02 MED ORDER — FLUTICASONE-SALMETEROL 250-50 MCG/DOSE IN AEPB
1.0000 | INHALATION_SPRAY | Freq: Two times a day (BID) | RESPIRATORY_TRACT | Status: DC
  Administered 2011-04-03 – 2011-04-04 (×3): 1 via RESPIRATORY_TRACT
  Filled 2011-04-02: qty 14

## 2011-04-02 MED ORDER — SODIUM POLYSTYRENE SULFONATE 15 GM/60ML PO SUSP: 30.0000 g | Freq: Once | ORAL | Status: DC

## 2011-04-02 MED ORDER — MORPHINE SULFATE 2 MG/ML IJ SOLN
2.0000 mg | INTRAMUSCULAR | Status: DC | PRN
  Administered 2011-04-03 (×4): 2 mg via INTRAVENOUS
  Filled 2011-04-02 (×4): qty 1

## 2011-04-02 MED ORDER — ACETAMINOPHEN 650 MG RE SUPP: 650.0000 mg | Freq: Four times a day (QID) | RECTAL | Status: DC | PRN

## 2011-04-02 MED ORDER — METRONIDAZOLE IN NACL 5-0.79 MG/ML-% IV SOLN
500.0000 mg | Freq: Three times a day (TID) | INTRAVENOUS | Status: DC
  Administered 2011-04-03 – 2011-04-06 (×11): 500 mg via INTRAVENOUS
  Filled 2011-04-02 (×13): qty 100

## 2011-04-02 NOTE — ED Notes (Signed)
Patient with abdominal pain since last night and today it got worse and patient is experiencing nausea and vomiting

## 2011-04-02 NOTE — ED Provider Notes (Signed)
History     CSN: [id number]  Arrival date & time 04/02/11  1508   First MD Initiated Contact with Patient 04/02/11 1601      Chief Complaint  Patient presents with  . Abdominal Pain    (Consider location/radiation/quality/duration/timing/severity/associated sxs/prior treatment) Patient is a 77 y.o. female presenting with abdominal pain. The history is provided by the patient and a relative.  Abdominal Pain The primary symptoms of the illness include abdominal pain. The current episode started 6 to 12 hours ago. The onset of the illness was gradual.  The patient has not had a change in bowel habit. Symptoms associated with the illness do not include chills, anorexia or heartburn. Associated symptoms comments: Nausea and vomiting.   she states she has passed gas today, and had a normal bowel movement earlier this morning. She denies fever or change in urinary habits , or discomfort.  Past Medical History  Diagnosis Date  . Diabetes mellitus   . Obesity   . COPD (chronic obstructive pulmonary disease)     Moderate. PFTs (12/10): FVC 67%, FEV1 58%, ratio 60%, TLC 95%, RV 138%, DLCO 43% (moderate obstructive defect). She is on home oxygen that should be worn at all times.  . Diastolic CHF, chronic     Echo (3/12): EF 55-60%, mild LV hypertrophy, grade I diastolic dysfunction, mild left atrial enlargement, normal pulmonary artery pressure  . CKD (chronic kidney disease)     Cr 2.4 when last checked. 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 PNA (pneumonia)     h/o with ARDS in 2005; MRSA PNA with prolonged hospitalization in [facility] regional 11/11  . Diverticulum     Jejunal  . OSA (obstructive sleep apnea)     possible. When pt was sedated for TEE in May 2010, she did develop some upper airway obstruction  . Anemia     of chronic disease  . Hypothyroidism   . Gout   . Atrial fibrillation 07/2008    Failed cardioversion with TEE guidance in May 2010. Pt  went back to NSR by 11/2008 with amiodarne    Past Surgical History  Procedure Date  . Tracheostomy     ARDS/ ICU admission, trach 2005  . Microperforation 08/2008    Of jejunem, ICU admission    Family History  Problem Relation Age of Onset  . Emphysema Mother     and asthma  . Asthma Mother   . Heart disease Neg Hx     premature    History  Substance Use Topics  . Smoking status: Former Smoker -- 1.0 packs/day for 23 years    Quit date: 04/11/1968  . Smokeless tobacco: Not on file   Comment: Started at age 62 (quit at age 5)  . Alcohol Use: No    OB History    Grav Para Term Preterm Abortions TAB SAB Ect Mult Living                  Review of Systems  Constitutional: Negative for chills.  Gastrointestinal: Positive for abdominal pain. Negative for heartburn and anorexia.  All other systems reviewed and are negative.    Allergies  Sulfonamide derivatives  Home Medications   Current Outpatient Rx  Name Route Sig Dispense Refill  . ALBUTEROL SULFATE (2.5 MG/3ML) 0.083% IN NEBU Nebulization Take 3 mLs (2.5 mg total) by nebulization every 6 (six) hours as needed for wheezing. 75 mL 12  . ALBUTEROL SULFATE HFA 108 (90 BASE)  MCG/ACT IN AERS Inhalation Inhale 2 puffs into the lungs every 4 (four) hours as needed for wheezing. 1 Inhaler 5  . AMLODIPINE BESYLATE 10 MG PO TABS Oral Take 10 mg by mouth daily.      . FENOFIBRATE 48 MG PO TABS Oral Take 48 mg by mouth daily.     Marland Kitchen MAGNESIUM OXIDE 400 MG PO TABS Oral Take 400 mg by mouth 3 (three) times daily.      Marland Kitchen METOPROLOL TARTRATE 25 MG PO TABS Oral Take 25 mg by mouth 2 (two) times daily.      . MOMETASONE FURO-FORMOTEROL FUM 200-5 MCG/ACT IN AERO Inhalation Inhale 2 puffs into the lungs 2 (two) times daily. 1 Inhaler 0  . PREDNISONE 20 MG PO TABS Oral Take 40 mg by mouth daily. Leftover from previous course     . RANITIDINE HCL 150 MG PO CAPS Oral Take 150 mg by mouth every evening.      Marland Kitchen SIMVASTATIN 20 MG PO  TABS Oral Take 1 tablet (20 mg total) by mouth at bedtime. 90 tablet 3  . TIOTROPIUM BROMIDE MONOHYDRATE 18 MCG IN CAPS Inhalation Place 1 capsule (18 mcg total) into inhaler and inhale daily. 30 capsule 11  . TRIAMCINOLONE ACETONIDE 0.1 % EX CREA Topical Apply topically 2 (two) times daily. 454 g 1  . WARFARIN SODIUM 2 MG PO TABS Oral Take 2 mg by mouth daily. As directed: need to verify dose     . FUROSEMIDE 40 MG PO TABS Oral Take 40 mg by mouth 2 (two) times daily.      . NON FORMULARY  OXYGEN. 2 liters at bedtime and as needed during the day.     . WARFARIN SODIUM 2 MG PO TABS  TAKE ONE TABLET BY MOUTH EVERY DAY AS DIRECTED 30 tablet 5    BP 122/70  Pulse 41  Temp(Src) 98.6 F (37 C) (Oral)  Resp 20  SpO2 95%  Physical Exam  Nursing note and vitals reviewed. Constitutional: She is oriented to person, place, and time. She appears well-developed and well-nourished.  HENT:  Head: Normocephalic and atraumatic.  Eyes: Conjunctivae and EOM are normal. Pupils are equal, round, and reactive to light.  Neck: Normal range of motion and phonation normal. Neck supple.  Cardiovascular: Normal rate, regular rhythm and intact distal pulses.   Pulmonary/Chest: Effort normal and breath sounds normal. She exhibits no tenderness.  Abdominal: Soft. She exhibits distension. There is tenderness. There is no rebound and no guarding.       Hypoactive bowel sounds.  Musculoskeletal: Normal range of motion.  Neurological: She is alert and oriented to person, place, and time. She has normal strength and normal reflexes. She exhibits normal muscle tone.  Skin: Skin is warm and dry.  Psychiatric: She has a normal mood and affect. Her behavior is normal. Judgment and thought content normal.    ED Course  Procedures (including critical care time) Emergency department treatment: IV fluids. IV insulin IV calcium gluconate IV, D50 IV, Dilaudid, and IV Zofran  CRITICAL CARE Performed by: Mancel Bale  L   Total critical care time: 55  Critical care time was exclusive of separately billable procedures and treating other patients.  Critical care was necessary to treat or prevent imminent or life-threatening deterioration.  Critical care was time spent personally by me on the following activities: development of treatment plan with patient and/or surrogate as well as nursing, discussions with consultants, evaluation of patient's response to treatment, examination of  patient, obtaining history from patient or surrogate, ordering and performing treatments and interventions, ordering and review of laboratory studies, ordering and review of radiographic studies, pulse oximetry and re-evaluation of patient's condition. Labs Reviewed  CBC - Abnormal; Notable for the following:    Hemoglobin 11.1 (*)    RDW 16.9 (*)    All other components within normal limits  DIFFERENTIAL - Abnormal; Notable for the following:    Neutrophils Relative 86 (*)    Neutro Abs 8.0 (*)    Lymphocytes Relative 10 (*)    All other components within normal limits  COMPREHENSIVE METABOLIC PANEL - Abnormal; Notable for the following:    Sodium 134 (*)    Potassium 5.9 (*)    Glucose, Bld 177 (*)    BUN 79 (*)    Creatinine, Ser 2.99 (*)    Albumin 2.9 (*)    GFR calc non Af Amer 14 (*)    GFR calc Af Amer 16 (*)    All other components within normal limits  URINALYSIS, ROUTINE W REFLEX MICROSCOPIC - Abnormal; Notable for the following:    APPearance CLOUDY (*)    Hgb urine dipstick SMALL (*)    Leukocytes, UA SMALL (*)    All other components within normal limits  POCT I-STAT, CHEM 8 - Abnormal; Notable for the following:    Potassium 5.8 (*)    BUN 87 (*)    Creatinine, Ser 3.00 (*)    Glucose, Bld 180 (*)    All other components within normal limits  URINE MICROSCOPIC-ADD ON - Abnormal; Notable for the following:    Squamous Epithelial / LPF FEW (*)    Bacteria, UA MANY (*)    All other components within  normal limits  TROPONIN I  POTASSIUM  OCCULT BLOOD, POC DEVICE  I-STAT, CHEM 8  URINE CULTURE  POCT OCCULT BLOOD STOOL, DEVICE   Dg Chest Port 1 View  04/02/2011  *RADIOLOGY REPORT*  Clinical Data: Chest pain  PORTABLE CHEST - 1 VIEW  Comparison: 09/23/2008  Findings: There is cardiomegaly with vascular congestion.  Stable right basilar density, likely scarring.  No focal opacity on the left.  No overt edema or effusions.  No acute bony abnormality.  IMPRESSION: Cardiomegaly, vascular congestion.  Original Report Authenticated By: Cyndie Chime, M.D.   Dg Abd Portable 2v  04/02/2011  *RADIOLOGY REPORT*  Clinical Data: Abdominal pain, distention.  ABDOMEN - 2 VIEW  Comparison: 09/12/2008.  Findings: No free air.  Nonobstructive bowel gas pattern.  No organomegaly or suspicious calcification.  Degenerative changes in the lumbar spine and hips.  IMPRESSION: No acute findings.  Original Report Authenticated By: Cyndie Chime, M.D.   Emergency department course: 21:11- Rocephin ordered for UTI. Repeat potassium 4.8, much improved.  Date: 04/02/2011  Rate: 40  Rhythm: sinus bradycardia  QRS Axis: Right axis deviation  Intervals: normal  ST/T Wave abnormalities: normal  Conduction Disutrbances:none  Narrative Interpretation:   Old EKG Reviewed: changes noted   1. Abdominal  pain, other specified site   2. Bradycardia   3. Hyperkalemia   4. Dehydration   5. Urinary Tract Infection    MDM  Patient with abdominal pain, and bradycardia, with hyperkalemia; all improved emergency department treatment. Since abdominal pain, source is not clear yet. She'll be admitted for further management and evaluation.        Flint Melter, MD 04/02/11 [phone]

## 2011-04-02 NOTE — ED Notes (Signed)
Attempted to call report. Nurse unable to take at this time.

## 2011-04-02 NOTE — ED Notes (Addendum)
Admitting MD at bedside. CT delivered contrast to drink for 2 hours. Admitting MD and EDP aware.

## 2011-04-02 NOTE — Progress Notes (Addendum)
ANTICOAGULATION/ANTIBIOTIC CONSULT NOTE - INITIAL  Pharmacy Consult for Cipro/Coumadin Indication: r/o bowel infection + h/o Afib  Allergies  Allergen Reactions  . Sulfonamide Derivatives     Childhood reaction    Patient Measurements: Height: 5\' 4"  (162.6 cm) Weight: 209 lb (94.802 kg) (Copied from recent outpt OV) IBW/kg (Calculated) : 54.7   Vital Signs: Temp: 103 F (39.4 C) (12/22 2134) Temp src: Oral (12/22 1601) BP: 121/36 mmHg (12/22 2134) Pulse Rate: 46  (12/22 2134)  Labs:  Basename 04/02/11 1718 04/02/11 1619  WBC -- 9.3  HGB 12.9 11.1*  PLT -- 270  LABCREA -- --  CREATININE 3.00* 2.99*   Estimated Creatinine Clearance: 17.2 ml/min (by C-G formula based on Cr of 3).  Microbiology: No results found for this or any previous visit (from the past 720 hour(s)).  Medical History: Past Medical History  Diagnosis Date  . Diabetes mellitus   . Obesity   . COPD (chronic obstructive pulmonary disease)     Moderate. PFTs (12/10): FVC 67%, FEV1 58%, ratio 60%, TLC 95%, RV 138%, DLCO 43% (moderate obstructive defect). She is on home oxygen that should be worn at all times.  . Diastolic CHF, chronic     Echo (3/12): EF 55-60%, mild LV hypertrophy, grade I diastolic dysfunction, mild left atrial enlargement, normal pulmonary artery pressure  . CKD (chronic kidney disease)     Cr 2.4 when last checked. 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 PNA (pneumonia)     h/o with ARDS in 2005; MRSA PNA with prolonged hospitalization in [facility] regional 11/11  . Diverticulum     Jejunal  . OSA (obstructive sleep apnea)     possible. When pt was sedated for TEE in May 2010, she did develop some upper airway obstruction  . Anemia     of chronic disease  . Hypothyroidism   . Gout   . Atrial fibrillation 07/2008    Failed cardioversion with TEE guidance in May 2010. Pt went back to NSR by 11/2008 with amiodarne    Medications:  Prescriptions prior  to admission  Medication Sig Dispense Refill  . albuterol (PROVENTIL) (2.5 MG/3ML) 0.083% nebulizer solution Take 3 mLs (2.5 mg total) by nebulization every 6 (six) hours as needed for wheezing.  75 mL  12  . albuterol (VENTOLIN HFA) 108 (90 BASE) MCG/ACT inhaler Inhale 2 puffs into the lungs every 4 (four) hours as needed for wheezing.  1 Inhaler  5  . amLODipine (NORVASC) 10 MG tablet Take 10 mg by mouth daily.        . fenofibrate (TRICOR) 48 MG tablet Take 48 mg by mouth daily.       . magnesium oxide (MAG-OX) 400 MG tablet Take 400 mg by mouth 3 (three) times daily.        . metoprolol tartrate (LOPRESSOR) 25 MG tablet Take 25 mg by mouth 2 (two) times daily.        . Mometasone Furo-Formoterol Fum (DULERA) 200-5 MCG/ACT AERO Inhale 2 puffs into the lungs 2 (two) times daily.  1 Inhaler  0  . ranitidine (ZANTAC) 150 MG capsule Take 150 mg by mouth every evening.        . simvastatin (ZOCOR) 20 MG tablet Take 1 tablet (20 mg total) by mouth at bedtime.  90 tablet  3  . tiotropium (SPIRIVA) 18 MCG inhalation capsule Place 1 capsule (18 mcg total) into inhaler and inhale daily.  30 capsule  11  .  triamcinolone (KENALOG) 0.1 % cream Apply topically 2 (two) times daily.  454 g  1  . warfarin (COUMADIN) 2 MG tablet Take 2 mg by mouth daily. As directed: need to verify dose       . furosemide (LASIX) 40 MG tablet Take 40 mg by mouth 2 (two) times daily.        . NON FORMULARY OXYGEN. 2 liters at bedtime and as needed during the day.       . predniSONE (DELTASONE) 20 MG tablet Take 40 mg by mouth daily. Leftover from previous course       . warfarin (COUMADIN) 2 MG tablet TAKE ONE TABLET BY MOUTH EVERY DAY AS DIRECTED  30 tablet  5   Scheduled:    . albuterol  2.5 mg Nebulization Q6H  . calcium gluconate  1 g Intravenous Once  . calcium gluconate      . cefTRIAXone (ROCEPHIN)  IV  1 g Intravenous Once  . dextrose  25 mL Intravenous Once  . fenofibrate  54 mg Oral Daily  .  Fluticasone-Salmeterol  1 puff Inhalation BID  .  HYDROmorphone (DILAUDID) injection  0.5 mg Intravenous Once  . insulin aspart  5 Units Subcutaneous Once  . metronidazole  500 mg Intravenous Q8H  . ondansetron  4 mg Intravenous Once  . simvastatin  20 mg Oral QHS  . sodium chloride      . sodium polystyrene  30 g Oral Once  . tiotropium  18 mcg Inhalation Daily  . DISCONTD: Mometasone Furo-Formoterol Fum  2 puff Inhalation BID   Assessment: 77yo female c/o worsening abdominal pain, found with distention, to begin IV ABX for possible bowel source (DDx appendicitis vs perf viscus vs obstruction); also to continue Coumadin during admission.  Goal of Therapy:  INR 2-3  Plan:  Will begin Cipro 400mg  IV Q24H.  Will obtain INR (most recent available ~81mo ago during regular f/u) and dose Coumadin accordingly.  Colleen Can PharmD BCPS 04/02/2011,11:52 PM  Addendum: INR supratherapeutic (3.3), so will hold off on dose today.  VB

## 2011-04-02 NOTE — H&P (Signed)
PCP:   Hannah Beat, MD, MD  Dr. Arrie Aran, nephrology   Chief Complaint:  Abd pain  HPI: 80yoF with h/o moderate COPD/home O2, grade I diastolic CHF, CKD with ANCA positive  pauci-immune GN, h/o ARDS/tracheostomy in 2005, AFib/on Coumadin s/p failed  cardioversion 08/2008, h/o microperforation of jejunum 08/2008.  Daughter and pt report "grumbling stomach" for the past 1.5 weeks that was minimally painful but for which she did not seek care, but took Activia without relief. No associated diarrhea, fevers, systemic illness at that time. Last night her abdominal pain abruptly worsened, worse in the RLQ. She took tylenol and went to bed, woke up better but through the am as she was getting ready to go shopping with daughter, was c/o 5/10 abd pain. Around noon it got much worse, and now associated with nausea, cold sweats from the pain, still no diarrhea or fever noted. Had last BM this am that was normal, no constipation, bleeding, straining. Daughter notes flatus earlier this am.   In the ED, pt had bradycardia to 41, and spiked a fever to 103 otherwise vitals  stable. Chem panel initially Na 134, K 5.9, renal 79/2.99. LFT's normal, Trop negative  x1, CBC normal. Repeat chem an hour later with K 5.8, renal 87/3.0. Then repeat K was  4.8. UA with small LE/neg nitrite, 7-10 WBC, many bacteria. Fecal occult blood  negative. Abd plain film negative. CXR with cardiomegaly, vascular congestion.   Pt was given 1g Ca gluconate, insulin/dextrose, zofran, 0.5 mg Dilaudid, and 1g Ceftriaxone. Getting CTAP with oral but NO IV contrast due to renal fxn.   ROS as above, o/w negative for cardiopulmonary sxs, dizziness, LH, chest palpitations, dysuria. Has chronic pitting edema. O/w negative.   Past Medical History  Diagnosis Date  . Diabetes mellitus   . Obesity   . COPD (chronic obstructive pulmonary disease)     Moderate. PFTs (12/10): FVC 67%, FEV1 58%, ratio 60%, TLC 95%, RV 138%, DLCO 43%  (moderate obstructive defect). She is on home oxygen that should be worn at all times.  . Diastolic CHF, chronic     Echo (3/12): EF 55-60%, mild LV hypertrophy, grade I diastolic dysfunction, mild left atrial enlargement, normal pulmonary artery pressure  . CKD (chronic kidney disease)     Cr 2.4 when last checked. 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 PNA (pneumonia)     h/o with ARDS in 2005; MRSA PNA with prolonged hospitalization in [facility] regional 11/11  . Diverticulum     Jejunal  . OSA (obstructive sleep apnea)     possible. When pt was sedated for TEE in May 2010, she did develop some upper airway obstruction  . Anemia     of chronic disease  . Hypothyroidism   . Gout   . Atrial fibrillation 07/2008    Failed cardioversion with TEE guidance in May 2010. Pt went back to NSR by 11/2008 with amiodarne    Past Surgical History  Procedure Date  . Tracheostomy     ARDS/ ICU admission, trach 2005  . Microperforation 08/2008    Of jejunem, ICU admission    Medications:  HOME MEDS:  Reconciled with daughter and pt  Prior to Admission medications   Medication Sig Start Date End Date Taking? Authorizing Provider  albuterol (PROVENTIL) (2.5 MG/3ML) 0.083% nebulizer solution Take 3 mLs (2.5 mg total) by nebulization every 6 (six) hours as needed for wheezing. 02/14/11 02/14/12 Yes Hannah Beat, MD  albuterol (VENTOLIN  HFA) 108 (90 BASE) MCG/ACT inhaler Inhale 2 puffs into the lungs every 4 (four) hours as needed for wheezing. 02/14/11  Yes Spencer Copland, MD  amLODipine (NORVASC) 10 MG tablet Take 10 mg by mouth daily.     Yes Historical Provider, MD  fenofibrate (TRICOR) 48 MG tablet Take 48 mg by mouth daily.    Yes Historical Provider, MD  magnesium oxide (MAG-OX) 400 MG tablet Take 400 mg by mouth 3 (three) times daily.     Yes Historical Provider, MD  metoprolol tartrate (LOPRESSOR) 25 MG tablet Take 25 mg by mouth 2 (two) times daily.     Yes  Historical Provider, MD  Mometasone Furo-Formoterol Fum (DULERA) 200-5 MCG/ACT AERO Inhale 2 puffs into the lungs 2 (two) times daily. 02/14/11  Yes Spencer Copland, MD  ranitidine (ZANTAC) 150 MG capsule Take 150 mg by mouth every evening.     Yes Historical Provider, MD  simvastatin (ZOCOR) 20 MG tablet Take 1 tablet (20 mg total) by mouth at bedtime. 01/20/11 01/20/12 Yes Spencer Copland, MD  tiotropium (SPIRIVA) 18 MCG inhalation capsule Place 1 capsule (18 mcg total) into inhaler and inhale daily. 02/14/11  Yes Spencer Copland, MD  triamcinolone (KENALOG) 0.1 % cream Apply topically 2 (two) times daily. 02/14/11 02/14/12 Yes Spencer Copland, MD  warfarin (COUMADIN) 2 MG tablet Take 2 mg by mouth daily. As directed: need to verify dose    Yes Historical Provider, MD  furosemide (LASIX) 40 MG tablet Take 40 mg by mouth 2 (two) times daily.      Historical Provider, MD  NON FORMULARY OXYGEN. 2 liters at bedtime and as needed during the day.     Historical Provider, MD  predniSONE (DELTASONE) 20 MG tablet Take 40 mg by mouth daily. Leftover from previous course  02/14/11   Hannah Beat, MD  warfarin (COUMADIN) 2 MG tablet TAKE ONE TABLET BY MOUTH EVERY DAY AS DIRECTED 11/15/10   Kerby Nora, MD    Allergies:  Allergies  Allergen Reactions  . Sulfonamide Derivatives     Childhood reaction    Social History:   reports that she quit smoking about 43 years ago. She does not have any smokeless tobacco history on file. She reports that she does not drink alcohol or use illicit drugs. Still living at home, married and has at least a daughter [name] [phone] and a son. Daughter is an NP. Pt emergency contact per [name] is [name] (sister) [phone]  Family History: Family History  Problem Relation Age of Onset  . Emphysema Mother     and asthma  . Asthma Mother   . Heart disease Neg Hx     premature    Physical Exam: Filed Vitals:   04/02/11 1510 04/02/11 1601 04/02/11 1856 04/02/11 2134    BP: 138/46 140/48 122/70 121/36  Pulse: 41 69 41 46  Temp: 97.4 F (36.3 C) 98.6 F (37 C)  103 F (39.4 C)  TempSrc: Oral Oral    Resp: 18 22 20 20   SpO2: 93% 92% 95% 96%   Blood pressure 121/36, pulse 46, temperature 103 F (39.4 C), temperature source Oral, resp. rate 20, SpO2 96.00%. Gen: Elderly but still robust appearing F in discomfort but not distress due to abd  pain. Is alert, attentive, can converse. Daughter at bedside. No cardiopulmonary  distress HEENT: PERRL, EOMI, sclera clear, irises normal, conjunctivae normal. Mouth/tongue  minimally dry appearing.  Neck: thick, with neck pannus Lungs: Poor to fair air  movement with some course breath sounds but overall OK  sounding. Difficult exam though Heart: Faint, soft S1/2 without apparent murmurs or gallops Abd: Very apparently distended, but not tight, rigid, or frankly peritoneal. Very  tender though with gaurding, facial grimacing, seems worse in the BLQ's and less in the  upper quadrants, but fairly diffusely tender. BS's hypoactive.  Extrem: Warm, perfusing OK with fair bulk and tone. BLE's have impressive  hyperkeratosis and scaling on background of pink erythema and soft pitting edema Neuro: Alert, awake, attentive, follows conversation, CN 2-12 intact, moving extrems  spontan. Grossly non-focal   Labs & Imaging Results for orders placed during the hospital encounter of 04/02/11 (from the past 48 hour(s))  CBC     Status: Abnormal   Collection Time   04/02/11  4:19 PM      Component Value Range Comment   WBC 9.3  4.0 - 10.5 (K/uL)    RBC 4.08  3.87 - 5.11 (MIL/uL)    Hemoglobin 11.1 (*) 12.0 - 15.0 (g/dL)    HCT 16.1  09.6 - 04.5 (%)    MCV 89.7  78.0 - 100.0 (fL)    MCH 27.2  26.0 - 34.0 (pg)    MCHC 30.3  30.0 - 36.0 (g/dL)    RDW 40.9 (*) 81.1 - 15.5 (%)    Platelets 270  150 - 400 (K/uL)   DIFFERENTIAL     Status: Abnormal   Collection Time   04/02/11  4:19 PM      Component Value Range Comment    Neutrophils Relative 86 (*) 43 - 77 (%)    Neutro Abs 8.0 (*) 1.7 - 7.7 (K/uL)    Lymphocytes Relative 10 (*) 12 - 46 (%)    Lymphs Abs 0.9  0.7 - 4.0 (K/uL)    Monocytes Relative 3  3 - 12 (%)    Monocytes Absolute 0.3  0.1 - 1.0 (K/uL)    Eosinophils Relative 1  0 - 5 (%)    Eosinophils Absolute 0.1  0.0 - 0.7 (K/uL)    Basophils Relative 0  0 - 1 (%)    Basophils Absolute 0.0  0.0 - 0.1 (K/uL)   COMPREHENSIVE METABOLIC PANEL     Status: Abnormal   Collection Time   04/02/11  4:19 PM      Component Value Range Comment   Sodium 134 (*) 135 - 145 (mEq/L)    Potassium 5.9 (*) 3.5 - 5.1 (mEq/L)    Chloride 96  96 - 112 (mEq/L)    CO2 24  19 - 32 (mEq/L)    Glucose, Bld 177 (*) 70 - 99 (mg/dL)    BUN 79 (*) 6 - 23 (mg/dL)    Creatinine, Ser 9.14 (*) 0.50 - 1.10 (mg/dL)    Calcium 9.8  8.4 - 10.5 (mg/dL)    Total Protein 6.9  6.0 - 8.3 (g/dL)    Albumin 2.9 (*) 3.5 - 5.2 (g/dL)    AST 19  0 - 37 (U/L)    ALT 15  0 - 35 (U/L)    Alkaline Phosphatase 85  39 - 117 (U/L)    Total Bilirubin 0.5  0.3 - 1.2 (mg/dL)    GFR calc non Af Amer 14 (*) >90 (mL/min)    GFR calc Af Amer 16 (*) >90 (mL/min)   TROPONIN I     Status: Normal   Collection Time   04/02/11  4:19 PM      Component Value Range  Comment   Troponin I <0.30  <0.30 (ng/mL)   POCT I-STAT, CHEM 8     Status: Abnormal   Collection Time   04/02/11  5:18 PM      Component Value Range Comment   Sodium 136  135 - 145 (mEq/L)    Potassium 5.8 (*) 3.5 - 5.1 (mEq/L)    Chloride 104  96 - 112 (mEq/L)    BUN 87 (*) 6 - 23 (mg/dL)    Creatinine, Ser 8.29 (*) 0.50 - 1.10 (mg/dL)    Glucose, Bld 562 (*) 70 - 99 (mg/dL)    Calcium, Ion 1.30  1.12 - 1.32 (mmol/L)    TCO2 29  0 - 100 (mmol/L)    Hemoglobin 12.9  12.0 - 15.0 (g/dL)    HCT 86.5  78.4 - 69.6 (%)   URINALYSIS, ROUTINE W REFLEX MICROSCOPIC     Status: Abnormal   Collection Time   04/02/11  7:48 PM      Component Value Range Comment   Color, Urine YELLOW  YELLOW      APPearance CLOUDY (*) CLEAR     Specific Gravity, Urine 1.017  1.005 - 1.030     pH 5.0  5.0 - 8.0     Glucose, UA NEGATIVE  NEGATIVE (mg/dL)    Hgb urine dipstick SMALL (*) NEGATIVE     Bilirubin Urine NEGATIVE  NEGATIVE     Ketones, ur NEGATIVE  NEGATIVE (mg/dL)    Protein, ur NEGATIVE  NEGATIVE (mg/dL)    Urobilinogen, UA 0.2  0.0 - 1.0 (mg/dL)    Nitrite NEGATIVE  NEGATIVE     Leukocytes, UA SMALL (*) NEGATIVE    URINE MICROSCOPIC-ADD ON     Status: Abnormal   Collection Time   04/02/11  7:48 PM      Component Value Range Comment   Squamous Epithelial / LPF FEW (*) RARE     WBC, UA 7-10  <3 (WBC/hpf)    RBC / HPF 3-6  <3 (RBC/hpf)    Bacteria, UA MANY (*) RARE    POTASSIUM     Status: Normal   Collection Time   04/02/11  7:52 PM      Component Value Range Comment   Potassium 4.8  3.5 - 5.1 (mEq/L) DELTA CHECK NOTED  OCCULT BLOOD, POC DEVICE     Status: Normal   Collection Time   04/02/11  8:12 PM      Component Value Range Comment   Fecal Occult Bld NEGATIVE      Dg Chest Port 1 View  04/02/2011  *RADIOLOGY REPORT*  Clinical Data: Chest pain  PORTABLE CHEST - 1 VIEW  Comparison: 09/23/2008  Findings: There is cardiomegaly with vascular congestion.  Stable right basilar density, likely scarring.  No focal opacity on the left.  No overt edema or effusions.  No acute bony abnormality.  IMPRESSION: Cardiomegaly, vascular congestion.  Original Report Authenticated By: Cyndie Chime, M.D.   Dg Abd Portable 2v  04/02/2011  *RADIOLOGY REPORT*  Clinical Data: Abdominal pain, distention.  ABDOMEN - 2 VIEW  Comparison: 09/12/2008.  Findings: No free air.  Nonobstructive bowel gas pattern.  No organomegaly or suspicious calcification.  Degenerative changes in the lumbar spine and hips.  IMPRESSION: No acute findings.  Original Report Authenticated By: Cyndie Chime, M.D.   ECG: poor baseline, but no apparent P waves, however some of the QRS complexes are  regular, therefore not sure  if sinus or fib, or both.  Review of telemetry showed some P  waves and regularity but also with some irregulary. Rate is slow at 40 bpm, right axis.  QRS complexes are small but this was previously noted. Sub 1mm ST depressions in V5-6,  subtle. T waves are prominent, all upright but are wide with symmetry. T waves are  different compared to prior.  Impression Present on Admission:  .Abdominal pain, acute, right lower quadrant .Fever .RENAL FAILURE, CHRONIC .Bradycardia .DIABETES MELLITUS, TYPE II .Atrial fibrillation .DIASTOLIC HEART FAILURE, CHRONIC .COPD  80yoF with COPD on home O2, worsening CKD/pauci-immune GN, h/o ARDS, AFib/Coumadin, h/o  perforated jejunum 08/2008 presents with abdominal distention and RLQ pain, fever,  possibly acute on chronic renal failure, hyperkalemia, bradycardia.   1. Abdominal pain, fever: Exam is concerning. She quite distended, in obvious pain, and  although overall her belly is soft and not quite peritoneal, it's quite painful to  minimal palpation. She has a h/o of jejunal perforation. DDx at this point:  appendicitis, perforated viscus, obstruction.  - BCx's, CTAP without contrast, venous lactate  - Will likely need surgical consult, will get imaging first - ABx: S/p Ceftriaxone, will start cipro/flagyl instead for presumed bowel source  - NPO, IVF's, bedrest, place Foley if needed   2. Hyperkalemia: s/p Ca gluconate, dextrose/insulin. Giving IVF's, kayexalate, and trend. Likely  due to worsening renal fxn and possibly acute illness  3. Bradycardia: Trop is negative. Suspect possibly medication induced in setting of  worsening renal failure? Currently no symtpoms, so will monitor this in SDU, hold meds,  trend ECG/enzymes.  - Troponin x1 more, trend ECG - Holding home amlodipine, metoprolol.   3. Chronic renal insufficiency: Cr now 3.0. It looks like baseline was 2.5-2.6 in the  early part of 2012, but has risen to 3.1-3.4 in 12/2010, so  not sure what her true  baseline is. Her BUN has also steadily increased over the year.   Per note from PCP [facility], she was being seen at [facility] and HD had  been discussed but apparently per the note she got mad at someone there and hasn't been  back since.   - IVF's, trend BMET. Holding home Lasix. Ulytes to calculate FeNa, FeUrea  - Needs to see [place] in f/u as inpt vs outpt   4. Atrial fibrillation: Holding metoprolol. Continue coumadin, get daily INR's   5. UTI: No reported dysuria, could be asymptomatic bactiuria. Getting ABx for above  problems anyways. Get UCx and just follow. Placing Foley.   6. HTN: holding home Metoprolol, Amlodipine, will tolerate higher BP's in setting of acute  illness for now  7. COPD: continue home regimen and keep on O2. Scheduled nebs.  8. HL: continue home statin, tricor   Stepdown unit, [facility] team 2 Full code, discussed with pt and daughter   Other plans as per orders.  [name] 04/02/2011, 10:49 PM

## 2011-04-03 ENCOUNTER — Inpatient Hospital Stay (HOSPITAL_COMMUNITY): Payer: Medicare Other

## 2011-04-03 ENCOUNTER — Encounter (HOSPITAL_COMMUNITY): Payer: Self-pay | Admitting: *Deleted

## 2011-04-03 DIAGNOSIS — K5712 Diverticulitis of small intestine without perforation or abscess without bleeding: Secondary | ICD-10-CM | POA: Diagnosis present

## 2011-04-03 LAB — GLUCOSE, CAPILLARY: Glucose-Capillary: 153 mg/dL — ABNORMAL HIGH (ref 70–99)

## 2011-04-03 LAB — COMPREHENSIVE METABOLIC PANEL
AST: 23 U/L (ref 0–37)
Albumin: 2.4 g/dL — ABNORMAL LOW (ref 3.5–5.2)
BUN: 83 mg/dL — ABNORMAL HIGH (ref 6–23)
Calcium: 9.2 mg/dL (ref 8.4–10.5)
Chloride: 98 mEq/L (ref 96–112)
Creatinine, Ser: 3.25 mg/dL — ABNORMAL HIGH (ref 0.50–1.10)
GFR calc non Af Amer: 13 mL/min — ABNORMAL LOW (ref >90)
Total Bilirubin: 0.3 mg/dL (ref 0.3–1.2)

## 2011-04-03 LAB — LACTIC ACID, PLASMA: Lactic Acid, Venous: 1 mmol/L (ref 0.5–2.2)

## 2011-04-03 LAB — CBC
Hemoglobin: 8.9 g/dL — ABNORMAL LOW (ref 12.0–15.0)
MCH: 26.6 pg (ref 26.0–34.0)
Platelets: 208 10*3/uL (ref 150–400)
RBC: 3.34 MIL/uL — ABNORMAL LOW (ref 3.87–5.11)
WBC: 10.6 10*3/uL — ABNORMAL HIGH (ref 4.0–10.5)

## 2011-04-03 LAB — UREA NITROGEN, URINE: Urea Nitrogen, Ur: 547 mg/dL

## 2011-04-03 LAB — TROPONIN I: Troponin I: <0.3 ng/mL (ref <0.30)

## 2011-04-03 LAB — OSMOLALITY, URINE: Osmolality, Ur: 371 mOsm/kg — ABNORMAL LOW (ref 390–1090)

## 2011-04-03 LAB — CREATININE, URINE, RANDOM: Creatinine, Urine: 101.09 mg/dL

## 2011-04-03 MED ORDER — INSULIN ASPART 100 UNIT/ML ~~LOC~~ SOLN: 0.0000 [IU] | Freq: Every day | SUBCUTANEOUS | Status: DC

## 2011-04-03 MED ORDER — SODIUM CHLORIDE 0.9 % IV BOLUS (SEPSIS)
500.0000 mL | Freq: Once | INTRAVENOUS | Status: AC
  Administered 2011-04-03: 500 mL via INTRAVENOUS

## 2011-04-03 MED ORDER — MUPIROCIN 2 % EX OINT
1.0000 "application " | TOPICAL_OINTMENT | Freq: Two times a day (BID) | CUTANEOUS | Status: AC
  Administered 2011-04-03 – 2011-04-07 (×9): 1 via NASAL
  Filled 2011-04-03: qty 22

## 2011-04-03 MED ORDER — CIPROFLOXACIN IN D5W 400 MG/200ML IV SOLN
400.0000 mg | INTRAVENOUS | Status: DC
  Administered 2011-04-03 – 2011-04-05 (×3): 400 mg via INTRAVENOUS
  Filled 2011-04-03 (×3): qty 200

## 2011-04-03 MED ORDER — INSULIN ASPART 100 UNIT/ML ~~LOC~~ SOLN
0.0000 [IU] | Freq: Three times a day (TID) | SUBCUTANEOUS | Status: DC
  Administered 2011-04-03: 2 [IU] via SUBCUTANEOUS
  Administered 2011-04-04 – 2011-04-06 (×5): 1 [IU] via SUBCUTANEOUS
  Administered 2011-04-06: 2 [IU] via SUBCUTANEOUS
  Administered 2011-04-06: 1 [IU] via SUBCUTANEOUS
  Administered 2011-04-06 – 2011-04-07 (×2): 2 [IU] via SUBCUTANEOUS
  Filled 2011-04-03: qty 3

## 2011-04-03 MED ORDER — CHLORHEXIDINE GLUCONATE CLOTH 2 % EX PADS
6.0000 | MEDICATED_PAD | Freq: Every day | CUTANEOUS | Status: AC
  Administered 2011-04-03 – 2011-04-07 (×5): 6 via TOPICAL

## 2011-04-03 MED ORDER — CIPROFLOXACIN IN D5W 400 MG/200ML IV SOLN
400.0000 mg | INTRAVENOUS | Status: AC
  Administered 2011-04-03: 400 mg via INTRAVENOUS
  Filled 2011-04-03: qty 200

## 2011-04-03 NOTE — Progress Notes (Signed)
MD/N pt BP 90-110/30-50, HR 40-50, Urine output 425 cc since 7am, new orders received

## 2011-04-03 NOTE — Progress Notes (Signed)
Physical Therapy Evaluation Patient Details Name: [name] MRN: [id number] DOB: 1931/01/30 Today's Date: 04/03/2011  Problem List:  Patient Active Problem List  Diagnoses  . HYPOTHYROIDISM  . DIABETES MELLITUS, TYPE II  . OVERWEIGHT/OBESITY  . OBSTRUCTIVE SLEEP APNEA  . HYPERTENSION, UNSPECIFIED  . Atrial fibrillation  . DIASTOLIC HEART FAILURE, CHRONIC  . COPD  . RENAL FAILURE, CHRONIC  . SLEEP APNEA  . HYPERLIPIDEMIA-MIXED  . Fatigue  . Osteoarthritis  . Venous reflux  . Abdominal pain, acute, right lower quadrant  . Fever  . Acute renal failure  . Bradycardia    Past Medical History:  Past Medical History  Diagnosis Date  . Diabetes mellitus   . Obesity   . COPD (chronic obstructive pulmonary disease)     Moderate. PFTs (12/10): FVC 67%, FEV1 58%, ratio 60%, TLC 95%, RV 138%, DLCO 43% (moderate obstructive defect). She is on home oxygen that should be worn at all times.  . Diastolic CHF, chronic     Echo (3/12): EF 55-60%, mild LV hypertrophy, grade I diastolic dysfunction, mild left atrial enlargement, normal pulmonary artery pressure  . CKD (chronic kidney disease)     Cr 2.4 when last checked. 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 PNA (pneumonia)     h/o with ARDS in 2005; MRSA PNA with prolonged hospitalization in [facility] 11/11  . Diverticulum     Jejunal  . OSA (obstructive sleep apnea)     possible. When pt was sedated for TEE in May 2010, she did develop some upper airway obstruction  . Anemia     of chronic disease  . Hypothyroidism   . Gout   . Atrial fibrillation 07/2008    Failed cardioversion with TEE guidance in May 2010. Pt went back to NSR by 11/2008 with amiodarne  . Hypertension   . GERD (gastroesophageal reflux disease)   . Peripheral vascular disease   . Pneumonia   . Chronic kidney disease    Past Surgical History:  Past Surgical History  Procedure Date  . Tracheostomy     ARDS/ ICU  admission, trach 2005  . Microperforation 08/2008    Of jejunem, ICU admission    PT Assessment/Plan/Recommendation PT Assessment Clinical Impression Statement: Pt is a 77 y/o female admitted with left sided abdominal pain, who presents with the below PT problem list.  Pt would benefit from acute PT to maximize independence and facilitate d/c home. PT Recommendation/Assessment: Patient will need skilled PT in the acute care venue PT Problem List: Decreased activity tolerance;Decreased balance;Decreased mobility;Decreased skin integrity Barriers to Discharge: Decreased caregiver support (Son and daughter-in-law both work.) PT Therapy Diagnosis : Acute pain PT Plan PT Frequency: Min 3X/week PT Treatment/Interventions: DME instruction;Gait training;Functional mobility training;Therapeutic activities;Balance training;Patient/family education PT Recommendation Follow Up Recommendations: Home health PT (as long as pain decreases and pt is able to increase activity) Equipment Recommended: None recommended by PT PT Goals  Acute Rehab PT Goals PT Goal Formulation: With patient/family Time For Goal Achievement: 7 days Pt will Roll Supine to Right Side: with supervision PT Goal: Rolling Supine to Right Side - Progress: Not met Pt will Roll Supine to Left Side: with supervision PT Goal: Rolling Supine to Left Side - Progress: Not met Pt will go Supine/Side to Sit: with supervision PT Goal: Supine/Side to Sit - Progress: Not met Pt will go Sit to Supine/Side: with supervision PT Goal: Sit to Supine/Side - Progress: Not met Pt will go Sit to  Stand: with supervision PT Goal: Sit to Stand - Progress: Not met Pt will go Stand to Sit: with supervision PT Goal: Stand to Sit - Progress: Not met Pt will Ambulate: >150 feet;with supervision;with least restrictive assistive device PT Goal: Ambulate - Progress: Not met Pt will Go Up / Down Stairs: 3-5 stairs;with supervision;with least restrictive  assistive device PT Goal: Up/Down Stairs - Progress: Not met  PT Evaluation Precautions/Restrictions  Precautions Required Braces or Orthoses: No Restrictions Weight Bearing Restrictions: No Prior Functioning  Home Living Lives With: Son (and Daughter-in-law.) Receives Help From: Family Type of Home: House Home Layout: One level Home Access: Stairs to enter Entrance Stairs-Rails: Right;Left;Can reach both Entrance Stairs-Number of Steps: 4 Home Adaptive Equipment: Environmental consultant - four wheeled;Wheelchair - Architectural technologist with back Prior Function Level of Independence: Independent with basic ADLs;Independent with transfers;Requires assistive device for independence;Needs assistance with homemaking Able to Take Stairs?: Yes Driving: No Vocation: Retired Producer, television/film/video: Awake/alert Overall Cognitive Status: Appears within functional limits for tasks assessed Sensation/Coordination Sensation Light Touch: Appears Intact Stereognosis: Not tested Hot/Cold: Not tested Proprioception: Not tested Coordination Gross Motor Movements are Fluid and Coordinated: Yes Fine Motor Movements are Fluid and Coordinated: Not tested Extremity Assessment RUE Assessment RUE Assessment: Within Functional Limits LUE Assessment LUE Assessment: Within Functional Limits RLE Assessment RLE Assessment: Within Functional Limits LLE Assessment LLE Assessment: Within Functional Limits Pain 5/10 in left side of abdomen.  Pt repositioned, and both RN and MD aware. Mobility (including Balance) Bed Mobility Bed Mobility: Yes Rolling Right: 2: Max assist Rolling Right Details (indicate cue type and reason): Assist to facilitate rotation of trunk at shoulders and hips while assisting left UE to reach across body.  Cues for sequence.  Limited by pain. Rolling Left: 2: Max assist Rolling Left Details (indicate cue type and reason): Assist to facilitate rotation of trunk at shoulders and  hips with assist to right UE to reach across body.  Cues for sequence.  Limited by pain. Transfers Transfers: No Ambulation/Gait Ambulation/Gait: No Stairs: No Wheelchair Mobility Wheelchair Mobility: No  Posture/Postural Control Posture/Postural Control: No significant limitations Balance Balance Assessed: No End of Session PT - End of Session Activity Tolerance: Patient limited by pain Patient left: in bed;with call bell in reach;with family/visitor present Nurse Communication: Other (comment) (Mobility status for bed mobility and limitations by pain.) General Behavior During Session: [facility] for tasks performed Cognition: [facility] for tasks performed  Cephus Shelling 04/03/2011, 10:54 AM  04/03/2011 Cephus Shelling, PT, DPT [phone]

## 2011-04-03 NOTE — Progress Notes (Addendum)
Subjective: 77 y/o admitted with abdominl pain and distension and noted to be bradycardic down to 40s on admission. She is asymptomatic in regards to the bradycardia. She continues to have abd pain but feels the distension is improving. She has not had any further vomiting and has has a normal BM since being admitted. She has had diverticulitis in the past and states that this episode is similar to her last.   Objective: Blood pressure 98/79, pulse 53, temperature 99.4 F (37.4 C), temperature source Oral, resp. rate 20, height 5\' 4"  (1.626 m), weight 89 kg (196 lb 3.4 oz), SpO2 92.00%. Weight change:   Intake/Output Summary (Last 24 hours) at 04/03/11 1347 Last data filed at 04/03/11 1218  Gross per 24 hour  Intake   1800 ml  Output    475 ml  Net   1325 ml    Physical Exam: General appearance: alert, cooperative and no distress Head: Normocephalic, without obvious abnormality, atraumatic Nose: Nares normal. Septum midline. Mucosa normal. No drainage or sinus tenderness. Throat: lips, mucosa, and tongue normal; teeth and gums normal Lungs: clear to auscultation bilaterally Heart: regular rate and rhythm, S1, S2 normal, no murmur, click, rub or gallop Abdomen: distended, tympanic, soft and tender in left lower quadrant. Bowel sounds are present.  Extremities: extremities normal, atraumatic, no cyanosis or edema Neurologic: Grossly normal  Lab Results:  Basename 04/03/11 0500 04/02/11 1952 04/02/11 1718 04/02/11 1619  NA 136 -- 136 --  K 4.6 4.8 -- --  CL 98 -- 104 --  CO2 25 -- -- 24  GLUCOSE 146* -- 180* --  BUN 83* -- 87* --  CREATININE 3.25* -- 3.00* --  CALCIUM 9.2 -- -- 9.8  MG -- -- -- --  PHOS -- -- -- --    Basename 04/03/11 0500 04/02/11 1619  AST 23 19  ALT 21 15  ALKPHOS 65 85  BILITOT 0.3 0.5  PROT 6.0 6.9  ALBUMIN 2.4* 2.9*   No results found for this basename: LIPASE:2,AMYLASE:2 in the last 72 hours  Basename 04/03/11 0500 04/02/11 1718 04/02/11 1619    WBC 10.6* -- 9.3  NEUTROABS -- -- 8.0*  HGB 8.9* 12.9 --  HCT 29.8* 38.0 --  MCV 89.2 -- 89.7  PLT 208 -- 270    Basename 04/03/11 0515 04/02/11 1619  CKTOTAL -- --  CKMB -- --  CKMBINDEX -- --  TROPONINI <0.30 <0.30   No components found with this basename: POCBNP:3 No results found for this basename: DDIMER:2 in the last 72 hours No results found for this basename: HGBA1C:2 in the last 72 hours No results found for this basename: CHOL:2,HDL:2,LDLCALC:2,TRIG:2,CHOLHDL:2,LDLDIRECT:2 in the last 72 hours No results found for this basename: TSH,T4TOTAL,FREET3,T3FREE,THYROIDAB in the last 72 hours No results found for this basename: VITAMINB12:2,FOLATE:2,FERRITIN:2,TIBC:2,IRON:2,RETICCTPCT:2 in the last 72 hours  Micro Results: Recent Results (from the past 240 hour(s))  MRSA PCR SCREENING     Status: Abnormal   Collection Time   04/02/11 11:37 PM      Component Value Range Status Comment   MRSA by PCR POSITIVE (*) NEGATIVE  Final     Studies/Results: Ct Abdomen Pelvis Wo Contrast  04/03/2011  *RADIOLOGY REPORT*  Clinical Data: Generalized abdominal pain, worse on the left.  CT ABDOMEN AND PELVIS WITHOUT CONTRAST  Technique:  Multidetector CT imaging of the abdomen and pelvis was performed following the standard protocol without intravenous contrast.  Comparison: CT of the abdomen and pelvis performed 07/29/2008  Findings: Bibasilar atelectasis is  noted.  Scattered coronary artery calcifications are noted.  Trace free fluid about the liver and spleen likely reflects the inflammatory process described below.  The liver and spleen are unremarkable in appearance.  Stones and sludge are seen layering dependently within the gallbladder; the gallbladder is otherwise unremarkable in appearance.  The pancreas and adrenal glands are unremarkable.  There is mild bilateral renal atrophy.  Nonspecific bilateral perinephric stranding is noted.  Mild vascular calcification is noted at the left  renal hilum.  The kidneys are otherwise unremarkable in appearance.  There is no evidence of hydronephrosis.  No renal or ureteral stones are seen.  There is diffuse diverticulosis along the distal jejunum, more prominent than on the prior study, with significant associated diffuse soft tissue inflammation at the left mid abdomen, and associated inflamed contrast-filled diverticula.  Findings are compatible with an acute diverticulitis of the jejunum, more diffuse than on the prior study.  Associated free fluid is noted tracking along Gerota's fascia on the left side, to the left paracolic gutter and into the pelvis.  There is no definite evidence of perforation on the current study.  No abscess formation is seen.  The stomach is unremarkable in appearance.  Soft tissue inflammation inferior to the stomach reflects the jejunal process. The remainder of the small bowel is unremarkable.  No acute vascular abnormalities are seen.  Diffuse calcification is noted along the abdominal aorta and its branches.  The appendix is decompressed, without evidence for appendicitis. Diffuse diverticulosis is again noted along the sigmoid colon, without evidence of associated diverticulitis.  The colon is otherwise unremarkable in appearance.  The bladder is decompressed, with a Foley catheter in place.  Air is noted within the bladder, with stable associated bladder diverticula.  The uterus is grossly unremarkable in appearance.  No suspicious adnexal masses are seen.  No inguinal lymphadenopathy is seen.  No acute osseous abnormalities are identified.  There is multilevel disc space narrowing and vacuum phenomenon along the lumbar spine, with complete osseous fusion at L4-L5.  IMPRESSION:  1.  Acute diffuse diverticulitis involving numerous prominent diverticula along the jejunum, more diffuse than in 2010. Significant soft tissue inflammation noted, with free fluid tracking about Gerota's fascia to the left paracolic gutter, about  the liver and spleen, and within the pelvis.  No definite evidence of perforation or abscess formation. 2.  Diffuse diverticulosis along the sigmoid colon, without evidence of diverticulitis. 3.  Bladder diverticula again noted. 4.  Diffuse calcification along the abdominal aorta and its branches. 5.  Diffuse degenerative change along the lumbar spine, with complete osseous fusion at L4-L5. 6.  Mild bilateral renal atrophy noted. 7.  Scattered coronary artery calcifications seen. 8.  Bibasilar atelectasis noted.  Original Report Authenticated By: Tonia Ghent, M.D.   Dg Chest Port 1 View  04/02/2011  *RADIOLOGY REPORT*  Clinical Data: Chest pain  PORTABLE CHEST - 1 VIEW  Comparison: 09/23/2008  Findings: There is cardiomegaly with vascular congestion.  Stable right basilar density, likely scarring.  No focal opacity on the left.  No overt edema or effusions.  No acute bony abnormality.  IMPRESSION: Cardiomegaly, vascular congestion.  Original Report Authenticated By: Cyndie Chime, M.D.   Dg Abd Portable 2v  04/02/2011  *RADIOLOGY REPORT*  Clinical Data: Abdominal pain, distention.  ABDOMEN - 2 VIEW  Comparison: 09/12/2008.  Findings: No free air.  Nonobstructive bowel gas pattern.  No organomegaly or suspicious calcification.  Degenerative changes in the lumbar spine and hips.  IMPRESSION:  No acute findings.  Original Report Authenticated By: Cyndie Chime, M.D.    Medications: Scheduled Meds:   . calcium gluconate  1 g Intravenous Once  . calcium gluconate      . cefTRIAXone (ROCEPHIN)  IV  1 g Intravenous Once  . Chlorhexidine Gluconate Cloth  6 each Topical Q0600  . ciprofloxacin  400 mg Intravenous NOW  . ciprofloxacin  400 mg Intravenous Q24H  . dextrose  25 mL Intravenous Once  . fenofibrate  54 mg Oral Daily  . Fluticasone-Salmeterol  1 puff Inhalation BID  .  HYDROmorphone (DILAUDID) injection  0.5 mg Intravenous Once  . insulin aspart  5 Units Subcutaneous Once  . metronidazole   500 mg Intravenous Q8H  . mupirocin ointment  1 application Nasal BID  . ondansetron  4 mg Intravenous Once  . simvastatin  20 mg Oral QHS  . sodium chloride      . sodium polystyrene  30 g Oral Once  . tiotropium  18 mcg Inhalation Daily  . DISCONTD: albuterol  2.5 mg Nebulization Q6H  . DISCONTD: Mometasone Furo-Formoterol Fum  2 puff Inhalation BID   Continuous Infusions:   . sodium chloride 125 mL/hr at 04/02/11 2330   PRN Meds:.acetaminophen, acetaminophen, albuterol, albuterol, morphine, ondansetron (ZOFRAN) IV, ondansetron  Assessment/Plan: Sepsis due to Diverticlulitis- continue with Cipro and flagyl . I will start clear liquids today and we will further advance tomorrow if she tolerates them. Pain control appears to be adequate.   Atrial fibrillation with Bradycardia- possibly due to B Blocker and CCB both of which are currently on hold.  Coumadin is on hold as well due to elevated INR.  If her HR improves or if she remains asymptomatic despite the bradycardia, she can be transferred out of the step down unit tomorrow.   Hypotension- related to sepsis and volume depletion. Cont to hold antihypertensives.    Acute renal failure/ hyperkalemia superimposed  - related to volume depletion and sepsis. She is currently on maintenance fluids continues to have satisfactory urine output - we will continue to hold lasix until volume status returns to baseline.  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   DIASTOLIC HEART FAILURE, CHRONIC  COPD- she is on home O2 continuously        LOS: 1 day   [facility] [phone] 04/03/2011, 1:47 PM

## 2011-04-04 LAB — GLUCOSE, CAPILLARY
Glucose-Capillary: 117 mg/dL — ABNORMAL HIGH (ref 70–99)
Glucose-Capillary: 126 mg/dL — ABNORMAL HIGH (ref 70–99)
Glucose-Capillary: 120 mg/dL — ABNORMAL HIGH (ref 70–99)
Glucose-Capillary: 127 mg/dL — ABNORMAL HIGH (ref 70–99)

## 2011-04-04 LAB — PROTIME-INR: Prothrombin Time: 29.2 seconds — ABNORMAL HIGH (ref 11.6–15.2)

## 2011-04-04 MED ORDER — IPRATROPIUM BROMIDE 0.02 % IN SOLN
0.5000 mg | Freq: Four times a day (QID) | RESPIRATORY_TRACT | Status: DC
  Administered 2011-04-05 – 2011-04-08 (×13): 0.5 mg via RESPIRATORY_TRACT
  Filled 2011-04-04 (×14): qty 2.5

## 2011-04-04 MED ORDER — BUDESONIDE 0.25 MG/2ML IN SUSP
0.2500 mg | Freq: Two times a day (BID) | RESPIRATORY_TRACT | Status: DC
  Administered 2011-04-04 – 2011-04-07 (×6): 0.25 mg via RESPIRATORY_TRACT
  Filled 2011-04-04 (×10): qty 2

## 2011-04-04 MED ORDER — OXYCODONE HCL 5 MG PO TABS
5.0000 mg | ORAL_TABLET | ORAL | Status: DC | PRN
  Administered 2011-04-04 – 2011-04-05 (×4): 10 mg via ORAL
  Filled 2011-04-04 (×4): qty 2

## 2011-04-04 MED ORDER — ALBUTEROL SULFATE (5 MG/ML) 0.5% IN NEBU
2.5000 mg | INHALATION_SOLUTION | Freq: Four times a day (QID) | RESPIRATORY_TRACT | Status: DC
  Administered 2011-04-05 – 2011-04-08 (×13): 2.5 mg via RESPIRATORY_TRACT
  Filled 2011-04-04 (×14): qty 0.5

## 2011-04-04 MED ORDER — WARFARIN SODIUM 1 MG PO TABS
1.0000 mg | ORAL_TABLET | Freq: Once | ORAL | Status: AC
  Administered 2011-04-04: 1 mg via ORAL
  Filled 2011-04-04: qty 1

## 2011-04-04 NOTE — Progress Notes (Signed)
ANTICOAGULATION CONSULT NOTE - Follow Up Consult  Pharmacy Consult for Coumadin Indication: atrial fibrillation  Allergies  Allergen Reactions  . Sulfonamide Derivatives     Childhood reaction    Patient Measurements: Height: 5\' 4"  (162.6 cm) Weight: 196 lb 3.4 oz (89 kg) (Copied from recent outpt OV) IBW/kg (Calculated) : 54.7  Adjusted Body Weight:   Vital Signs: Temp: 98.6 F (37 C) (12/24 0800) Temp src: Oral (12/24 0800) BP: 117/41 mmHg (12/24 0800) Pulse Rate: 64  (12/24 0800)  Labs:  Alvira Philips 04/04/11 0450 04/03/11 0515 04/03/11 0500 04/03/11 0033 04/02/11 1718 04/02/11 1619  HGB -- -- 8.9* -- 12.9 --  HCT -- -- 29.8* -- 38.0 36.6  PLT -- -- 208 -- -- 270  APTT -- -- -- -- -- --  LABPROT 29.2* -- -- 34.1* -- --  INR 2.71* -- -- 3.31* -- --  HEPARINUNFRC -- -- -- -- -- --  CREATININE -- -- 3.25* -- 3.00* 2.99*  CKTOTAL -- -- -- -- -- --  CKMB -- -- -- -- -- --  TROPONINI -- <0.30 -- -- -- <0.30   Estimated Creatinine Clearance: 15.4 ml/min (by C-G formula based on Cr of 3.25).   Medications:  Scheduled:    . Chlorhexidine Gluconate Cloth  6 each Topical Q0600  . ciprofloxacin  400 mg Intravenous Q24H  . fenofibrate  54 mg Oral Daily  . Fluticasone-Salmeterol  1 puff Inhalation BID  . insulin aspart  0-5 Units Subcutaneous QHS  . insulin aspart  0-9 Units Subcutaneous TID WC  . metronidazole  500 mg Intravenous Q8H  . mupirocin ointment  1 application Nasal BID  . simvastatin  20 mg Oral QHS  . sodium chloride  500 mL Intravenous Once  . sodium polystyrene  30 g Oral Once  . tiotropium  18 mcg Inhalation Daily   Pharmacist System-Based Medication Review: Anticoagulation - Afib: Coumadin PTA, INR 2.71. H/H low, pltc wnl. No bleeding noted. Infectious Disease -  Hx of jejunal perforation. CT remarkable for diffuse diverticulitis of the jejunum. Day #2 ciprofloxacin 400 mg IV daily, metronidazole 500 mg IV q8h. Tmax 99.8, WBC 10.6. Cardiovascular - Hx  Diastolic heart failure/HTN/afib/HL: on home simvastatin, fenofibrate, home BP meds currently on hold d/t bradycardia and soft pressures. Endocrinology - DM2: No home meds meds for DM2,  CBGs 117-179, stable on sensitive SSI. Hypothyroid: TSH 3.64 01/04/2011, no home medication listed. Gout: No home meds or symptomatic complaints. Gastrointestinal / Nutrition -  Diverticulitis. On clear liquids. Neurology - PRN pain meds Nephrology - CRI: Lytes wnl. Scr 3.25, which is above baseline. UOP 0.40ml/kg/hr. Pulmonary - COPD: On home advair and spiriva. Came in finishing prednisone taper (?COPD exacerbation). 91% 5L [place]. Hematology / Oncology - Anemia. H/H low, pltc wnl PTA Medication Issues -f/u resuming home BP meds when able Best Practices - VTE prophylaxis: Coumadin  Home Coumadin dose 2mg  MWFSS 1mg  TT  Assessment: [name] on Coumadin PTA for h/o Afib. INR is within goal range. Patient also on Cipro and Flagyl which have DDI with Coumadin. Will watch INR closely.  Goal of Therapy:  INR 2-3   Plan:  1. Coumadin 1 mg po tonight 2. INR daily  Parks, Victorino Dike Danielle 04/04/2011,9:11 AM

## 2011-04-04 NOTE — Progress Notes (Signed)
   CARE MANAGEMENT NOTE 04/04/2011  Patient:  [name], [name]   Account Number:  [id number]  Date Initiated:  04/04/2011  Documentation initiated by:  Onnie Boer  Subjective/Objective Assessment:   PT WAS ADMITTED WITH DIVERTICULITIS     Action/Plan:   PROGRESSION OF CARE AND DISCHARGE PLANNING   Anticipated DC Date:  04/11/2011   Anticipated DC Plan:  HOME W HOME HEALTH SERVICES      DC Planning Services  CM consult      Choice offered to / List presented to:             Status of service:  In process, will continue to follow Medicare Important Message given?   (If response is "NO", the following Medicare IM given date fields will be blank) Date Medicare IM given:   Date Additional Medicare IM given:    Discharge Disposition:    Per UR Regulation:  Reviewed for med. necessity/level of care/duration of stay  Comments:  UR COMPLETED 04/04/2011 Ramani Riva, RN,BSN 1254 PT WAS ADMITTED WITH DIVERTICULITIS AND HR IN THE 40'S.  PT WILL NEED PT AT DC, WILL F/U

## 2011-04-04 NOTE — Progress Notes (Signed)
Subjective: 77 y/o admitted with abdominal pain and distension and noted to be bradycardic down to 40s on admission. She is asymptomatic in regards to the bradycardia.  Her primary issue has been abdominal pain.  At the time of my exam today she reports that her abdominal pain persists but has improved compared to yesterday.  She denies chest pain shortness of breath nausea or vomiting.  Objective: Weight change:   Intake/Output Summary (Last 24 hours) at 04/04/11 1052 Last data filed at 04/04/11 0800  Gross per 24 hour  Intake   3125 ml  Output   1000 ml  Net   2125 ml   Blood pressure 117/41, pulse 64, temperature 98.6 F (37 C), temperature source Oral, resp. rate 19, height 5\' 4"  (1.626 m), weight 89 kg (196 lb 3.4 oz), SpO2 91.00%.  Physical Exam: General: No acute respiratory distress Lungs: Very distant breath sounds throughout all fields with no appreciable active wheeze Cardiovascular: Regular rate and rhythm without murmur gallop or rub at present Abdomen: Obese, protuberant but soft, no rebound, diffusely tender but less so than yesterday per patient history, bowel sounds positive Extremities: No significant cyanosis, clubbing, or edema bilateral lower extremities  Lab Results:  Basename 04/03/11 0500 04/02/11 1952 04/02/11 1718 04/02/11 1619  NA 136 -- 136 134*  K 4.6 4.8 5.8* --  CL 98 -- 104 96  CO2 25 -- -- 24  GLUCOSE 146* -- 180* 177*  BUN 83* -- 87* 79*  CREATININE 3.25* -- 3.00* 2.99*  CALCIUM 9.2 -- -- 9.8  MG -- -- -- --  PHOS -- -- -- --    Basename 04/03/11 0500 04/02/11 1619  AST 23 19  ALT 21 15  ALKPHOS 65 85  BILITOT 0.3 0.5  PROT 6.0 6.9  ALBUMIN 2.4* 2.9*    Basename 04/03/11 0500 04/02/11 1718 04/02/11 1619  WBC 10.6* -- 9.3  NEUTROABS -- -- 8.0*  HGB 8.9* 12.9 11.1*  HCT 29.8* 38.0 36.6  MCV 89.2 -- 89.7  PLT 208 -- 270    Basename 04/03/11 0515 04/02/11 1619  CKTOTAL -- --  CKMB -- --  CKMBINDEX -- --  TROPONINI <0.30 <0.30     Micro Results: Recent Results (from the past 240 hour(s))  CULTURE, BLOOD (ROUTINE X 2)     Status: Normal (Preliminary result)   Collection Time   04/02/11 10:21 PM      Component Value Range Status Comment   Specimen Description BLOOD RIGHT HAND   Final    Special Requests BOTTLES DRAWN AEROBIC AND ANAEROBIC 10CC EA   Final    Setup Time [id number]   Final    Culture     Final    Value:        BLOOD CULTURE RECEIVED NO GROWTH TO DATE CULTURE WILL BE HELD FOR 5 DAYS BEFORE ISSUING A FINAL NEGATIVE REPORT   Report Status PENDING   Incomplete   CULTURE, BLOOD (ROUTINE X 2)     Status: Normal (Preliminary result)   Collection Time   04/02/11 10:29 PM      Component Value Range Status Comment   Specimen Description BLOOD LEFT FOREARM   Final    Special Requests BOTTLES DRAWN AEROBIC ONLY 10CC   Final    Setup Time [id number]   Final    Culture     Final    Value:        BLOOD CULTURE RECEIVED NO GROWTH TO DATE CULTURE WILL BE HELD FOR  5 DAYS BEFORE ISSUING A FINAL NEGATIVE REPORT   Report Status PENDING   Incomplete   MRSA PCR SCREENING     Status: Abnormal   Collection Time   04/02/11 11:37 PM      Component Value Range Status Comment   MRSA by PCR POSITIVE (*) NEGATIVE  Final    Assessment/Plan:  Acute diffuse Diverticlulitis of the jejunum There is no evidence of perforation or abscess formation on CT scan of the abdomen-continue with Cipro and flagyl-given the extent of diverticulitis noted on her CT scan I will not attempt to advance her diet yet-clinically she does appear to slowly be improving  Atrial fibrillation with Bradycardia The patient's bradycardia is likely due to a medication response possibly worsened due to high vagal tone in the setting of persistent abdominal pain and nausea-her heart rate appears to be stable at the present time-we will continue to follow on telemetry  Hypotension related to acute infection and volume depletion-improving-cont to hold  antihypertensives   Acute renal failure/ hyperkalemia superimposed  related to volume depletion and hypovolemia-recheck renal function in AM-no labs were ordered for today-we will continue to hold lasix until volume status returns to baseline.   RENAL FAILURE, CHRONIC stage 4/ANCA+ pauci-immune glomerulonephritis  Baseline creatinine is reported to be 2.4-the patient is followed by [facility]-we will recheck her renal function in the morning  DIABETES MELLITUS, TYPE II Reasonably controlled at the present time-follow  DIASTOLIC HEART FAILURE, CHRONIC  No evidence of volume overload at the present time  COPD on home O2 continuously-diminished inspiratory effort related to abdominal pain is currently felt to be contributing to persistent hypoxia and increased O2 requirements-we will repeat a chest x-ray in the morning   LOS: 2 days  04/04/2011, 10:52 AM  Lonia Blood, MD [facility] Office  [phone] Pager [phone]  On-Call/Text Page:      [email or web address]      password [facility]

## 2011-04-04 NOTE — Progress Notes (Signed)
04/04/11 9:08 PT Note: Pt refused PT this morning secondary to still with increased pain.  Educated pt on importance of mobility, but pt still declining mobility.  Will follow as able.  04/04/2011 Cephus Shelling, PT, DPT [phone]

## 2011-04-05 ENCOUNTER — Inpatient Hospital Stay (HOSPITAL_COMMUNITY): Payer: Medicare Other

## 2011-04-05 LAB — BASIC METABOLIC PANEL
Chloride: 104 mEq/L (ref 96–112)
Creatinine, Ser: 2.72 mg/dL — ABNORMAL HIGH (ref 0.50–1.10)
GFR calc Af Amer: 18 mL/min — ABNORMAL LOW (ref >90)
Potassium: 4.2 mEq/L (ref 3.5–5.1)
Sodium: 139 mEq/L (ref 135–145)

## 2011-04-05 LAB — CBC
Platelets: 258 10*3/uL (ref 150–400)
RDW: 17.3 % — ABNORMAL HIGH (ref 11.5–15.5)
WBC: 11.1 10*3/uL — ABNORMAL HIGH (ref 4.0–10.5)

## 2011-04-05 LAB — GLUCOSE, CAPILLARY: Glucose-Capillary: 134 mg/dL — ABNORMAL HIGH (ref 70–99)

## 2011-04-05 LAB — URINE CULTURE
Colony Count: >=100000
Culture  Setup Time: 201212231119

## 2011-04-05 LAB — PROTIME-INR
INR: 2.39 — ABNORMAL HIGH (ref 0.00–1.49)
Prothrombin Time: 26.5 seconds — ABNORMAL HIGH (ref 11.6–15.2)

## 2011-04-05 MED ORDER — AMLODIPINE BESYLATE 10 MG PO TABS
10.0000 mg | ORAL_TABLET | Freq: Every day | ORAL | Status: DC
  Administered 2011-04-05: 10 mg via ORAL
  Filled 2011-04-05 (×2): qty 1

## 2011-04-05 MED ORDER — WARFARIN SODIUM 2 MG PO TABS
2.0000 mg | ORAL_TABLET | Freq: Once | ORAL | Status: AC
  Administered 2011-04-05: 2 mg via ORAL
  Filled 2011-04-05: qty 1

## 2011-04-05 MED ORDER — FUROSEMIDE 10 MG/ML IJ SOLN
40.0000 mg | Freq: Two times a day (BID) | INTRAMUSCULAR | Status: DC
  Administered 2011-04-05 (×2): 40 mg via INTRAVENOUS
  Filled 2011-04-05 (×4): qty 4

## 2011-04-05 NOTE — Progress Notes (Signed)
PHARMACY CONSULT NOTE - Follow Up Consult  Pharmacy Consult for Coumadin and Ciprofloxacin Indication: atrial fibrillation and diverticulitis of jejunum  Assessment: [name] on Coumadin PTA for h/o Afib and being managed on ciprofloxacin for diverticulitis.  INR is therapeutic at 2.39 but trending down (on arrival INR 3.31; dose 12/22 & 12/23 held). Patient also on Cipro and Flagyl for diverticulitis which have DDI with Coumadin so will need to watch closely. Home dose of Coumadin dose 2mg  MWFSS and 1mg  Tues/Thurs. H/H low but stable; no bleeding noted in chart. Tmax 98.9'F, WBC 11.1; Scr improved 2.72 (from 3.25); CrCl ~19 ml/min. Blood cultures pending, urine culture positive for E.Coli  Goal of Therapy:  INR 2-3   Plan:  Coumadin:  Coumadin 2 mg po tonight x 1  F/U daily INR  Ciprofloxacin:   Continue Cipro 400mg  IV Q24  Monitor renal function and clinical improvement  Benjaman Pott, PharmD     Pager [phone] 04/05/2011   9:23 AM   Allergies  Allergen Reactions  . Sulfonamide Derivatives     Childhood reaction    Patient Measurements: Height: 5\' 4"  (162.6 cm) Weight: 214 lb 8.1 oz (97.3 kg) IBW/kg (Calculated) : 54.7   Vital Signs: Temp: 98.7 F (37.1 C) (12/25 0800) Temp src: Oral (12/25 0800) BP: 138/57 mmHg (12/25 0800) Pulse Rate: 73  (12/25 0800)  Labs:  [name] 04/05/11 0358 04/04/11 0450 04/03/11 0515 04/03/11 0500 04/03/11 0033 04/02/11 1718 04/02/11 1619  HGB 9.3* -- -- 8.9* -- -- --  HCT 31.3* -- -- 29.8* -- 38.0 --  PLT 258 -- -- 208 -- -- 270  APTT -- -- -- -- -- -- --  LABPROT 26.5* 29.2* -- -- 34.1* -- --  INR 2.39* 2.71* -- -- 3.31* -- --  HEPARINUNFRC -- -- -- -- -- -- --  CREATININE 2.72* -- -- 3.25* -- 3.00* --  CKTOTAL -- -- -- -- -- -- --  CKMB -- -- -- -- -- -- --  TROPONINI -- -- <0.30 -- -- -- <0.30   Estimated Creatinine Clearance: 19.3 ml/min (by C-G formula based on Cr of 2.72).

## 2011-04-05 NOTE — Progress Notes (Signed)
Pt. Being transferred in her bed to room 2022. Phone report called to [place], [employer] fundraiser. Pt. Made aware of new room number.

## 2011-04-05 NOTE — Progress Notes (Signed)
Subjective: 77 y/o admitted with abdominl pain and distension and noted to be bradycardic down to 40s on admission.  She has had diverticulitis in the past and states that this episode is similar to her last.  She states she has been nauseated since last night but abdominal pain is improving.   Objective: Blood pressure 138/57, pulse 73, temperature 98.7 F (37.1 C), temperature source Oral, resp. rate 19, height 5\' 4"  (1.626 m), weight 97.3 kg (214 lb 8.1 oz), SpO2 91.00%. Weight change:   Intake/Output Summary (Last 24 hours) at 04/05/11 1106 Last data filed at 04/05/11 0900  Gross per 24 hour  Intake   1830 ml  Output   1125 ml  Net    705 ml    Physical Exam: General appearance: alert, cooperative and no distress  Head: Normocephalic, without obvious abnormality, atraumatic  Nose: Nares normal. Septum midline. Mucosa normal. No drainage or sinus tenderness.  Throat: lips, mucosa, and tongue normal; teeth and gums normal  Lungs: clear to auscultation bilaterally  Heart: regular rate and rhythm, S1, S2 normal, no murmur, click, rub or gallop  Abdomen: distended, tympanic, soft and tender in left lower quadrant. Bowel sounds are present.  Extremities: extremities normal, atraumatic, no cyanosis or edema  Neurologic: Grossly normal   Lab Results:  Basename 04/05/11 0358 04/03/11 0500  NA 139 136  K 4.2 4.6  CL 104 98  CO2 24 25  GLUCOSE 135* 146*  BUN 68* 83*  CREATININE 2.72* 3.25*  CALCIUM 8.9 9.2  MG -- --  PHOS -- --    Basename 04/03/11 0500 04/02/11 1619  AST 23 19  ALT 21 15  ALKPHOS 65 85  BILITOT 0.3 0.5  PROT 6.0 6.9  ALBUMIN 2.4* 2.9*   No results found for this basename: LIPASE:2,AMYLASE:2 in the last 72 hours  Basename 04/05/11 0358 04/03/11 0500 04/02/11 1619  WBC 11.1* 10.6* --  NEUTROABS -- -- 8.0*  HGB 9.3* 8.9* --  HCT 31.3* 29.8* --  MCV 90.5 89.2 --  PLT 258 208 --    Basename 04/03/11 0515 04/02/11 1619  CKTOTAL -- --  CKMB -- --   CKMBINDEX -- --  TROPONINI <0.30 <0.30   No components found with this basename: POCBNP:3 No results found for this basename: DDIMER:2 in the last 72 hours No results found for this basename: HGBA1C:2 in the last 72 hours No results found for this basename: CHOL:2,HDL:2,LDLCALC:2,TRIG:2,CHOLHDL:2,LDLDIRECT:2 in the last 72 hours No results found for this basename: TSH,T4TOTAL,FREET3,T3FREE,THYROIDAB in the last 72 hours No results found for this basename: VITAMINB12:2,FOLATE:2,FERRITIN:2,TIBC:2,IRON:2,RETICCTPCT:2 in the last 72 hours  Micro Results: Recent Results (from the past 240 hour(s))  CULTURE, BLOOD (ROUTINE X 2)     Status: Normal (Preliminary result)   Collection Time   04/02/11 10:21 PM      Component Value Range Status Comment   Specimen Description BLOOD RIGHT HAND   Final    Special Requests BOTTLES DRAWN AEROBIC AND ANAEROBIC 10CC EA   Final    Setup Time [id number]   Final    Culture     Final    Value:        BLOOD CULTURE RECEIVED NO GROWTH TO DATE CULTURE WILL BE HELD FOR 5 DAYS BEFORE ISSUING A FINAL NEGATIVE REPORT   Report Status PENDING   Incomplete   CULTURE, BLOOD (ROUTINE X 2)     Status: Normal (Preliminary result)   Collection Time   04/02/11 10:29 PM  Component Value Range Status Comment   Specimen Description BLOOD LEFT FOREARM   Final    Special Requests BOTTLES DRAWN AEROBIC ONLY 10CC   Final    Setup Time [id number]   Final    Culture     Final    Value:        BLOOD CULTURE RECEIVED NO GROWTH TO DATE CULTURE WILL BE HELD FOR 5 DAYS BEFORE ISSUING A FINAL NEGATIVE REPORT   Report Status PENDING   Incomplete   MRSA PCR SCREENING     Status: Abnormal   Collection Time   04/02/11 11:37 PM      Component Value Range Status Comment   MRSA by PCR POSITIVE (*) NEGATIVE  Final   URINE CULTURE     Status: Normal   Collection Time   04/02/11 11:58 PM      Component Value Range Status Comment   Specimen Description URINE, CATHETERIZED   Final     Special Requests     Final    Value: ADDED ON 04/03/11 AT 0053 PATIENT ON FOLLOWING ROCPEHIN   Setup Time [id number]   Final    Colony Count >=100,000 COLONIES/ML   Final    Culture ESCHERICHIA COLI   Final    Report Status 04/05/2011 FINAL   Final    Organism ID, Bacteria ESCHERICHIA COLI   Final     Studies/Results:  Dg Abd Portable 2v  04/05/2011  *RADIOLOGY REPORT*  Clinical Data: Abdominal distention, nausea  ABDOMEN - 2 VIEW  Comparison: CT abdomen pelvis of 04/03/2011  Findings: Both large and small bowel gas is present most consistent with mild ileus.  Some contrast is noted in the right colon.  There is abnormal opacity at the left lung base which may be due to atelectasis, effusion, or pneumonia.  IMPRESSION:  1.  No bowel obstruction.  Question mild ileus. 2.  Opacity at the left lung base.  Consider atelectasis, pneumonia, and/or left effusion.  Original Report Authenticated By: Juline Patch, M.D.     Medications: Scheduled Meds:   . ipratropium  0.5 mg Nebulization QID   And  . albuterol  2.5 mg Nebulization QID  . budesonide  0.25 mg Nebulization BID  . Chlorhexidine Gluconate Cloth  6 each Topical Q0600  . ciprofloxacin  400 mg Intravenous Q24H  . fenofibrate  54 mg Oral Daily  . insulin aspart  0-5 Units Subcutaneous QHS  . insulin aspart  0-9 Units Subcutaneous TID WC  . metronidazole  500 mg Intravenous Q8H  . mupirocin ointment  1 application Nasal BID  . simvastatin  20 mg Oral QHS  . warfarin  1 mg Oral ONCE-1800  . warfarin  2 mg Oral ONCE-1800  . DISCONTD: Fluticasone-Salmeterol  1 puff Inhalation BID  . DISCONTD: sodium polystyrene  30 g Oral Once  . DISCONTD: tiotropium  18 mcg Inhalation Daily   Continuous Infusions:   . sodium chloride 75 mL/hr at 04/05/11 2130   PRN Meds:.acetaminophen, acetaminophen, albuterol, morphine, ondansetron (ZOFRAN) IV, ondansetron, oxyCODONE  Assessment/Plan: Acute diffuse Diverticlulitis of the jejunum  There  is no evidence of perforation or abscess formation on CT scan of the abdomen-continue with Cipro and flagyl-As she has recurrent nausea today and a mild ileus on xray, I will not be advancing her diet beyond clear liquids today.   Atrial fibrillation with Bradycardia  The patient's bradycardia was likely due to a medication response possibly worsened due to high vagal tone in the setting  of persistent abdominal pain and nausea-her heart rate has improved to 60's and 70s upon holding her rate controlling medications.  BP is increasing therefore, I will resume her Norvasc today but continue to hold Metoprolol. Continue to monitor HR on telemetry.   Hypotension  related to acute infection and volume depletion-improving- resuming Norvasc.   Acute renal failure/ hyperkalemia superimposed  related to volume depletion and hypovolemia- improving. Will resume Lasix.   RENAL FAILURE, CHRONIC stage 4/ANCA+ pauci-immune glomerulonephritis  Baseline creatinine is reported to be 2.4-  DIABETES MELLITUS, TYPE II  Reasonably controlled at the present time-follow   DIASTOLIC HEART FAILURE, CHRONIC  REpeat CXR reveals pulmonary edema- resuming Lasix today. Will stop IVF for now. Will need to keep in mind that she is not taking much PO yet and is a high risk for developing dehydration.   COPD  on home O2 continuously-diminished inspiratory effort related to abdominal pain is currently felt to be contributing to persistent hypoxia and increased O2 requirements-      LOS: 3 days   [facility] [phone] 04/05/2011, 11:06 AM

## 2011-04-06 ENCOUNTER — Inpatient Hospital Stay (HOSPITAL_COMMUNITY): Payer: Medicare Other

## 2011-04-06 LAB — CBC
HCT: 31.9 % — ABNORMAL LOW (ref 36.0–46.0)
MCH: 26.5 pg (ref 26.0–34.0)
MCHC: 28.5 g/dL — ABNORMAL LOW (ref 30.0–36.0)
MCV: 92.7 fL (ref 78.0–100.0)
RDW: 17.6 % — ABNORMAL HIGH (ref 11.5–15.5)

## 2011-04-06 LAB — GLUCOSE, CAPILLARY
Glucose-Capillary: 153 mg/dL — ABNORMAL HIGH (ref 70–99)
Glucose-Capillary: 150 mg/dL — ABNORMAL HIGH (ref 70–99)

## 2011-04-06 LAB — BASIC METABOLIC PANEL
BUN: 66 mg/dL — ABNORMAL HIGH (ref 6–23)
Chloride: 104 mEq/L (ref 96–112)
Creatinine, Ser: 2.79 mg/dL — ABNORMAL HIGH (ref 0.50–1.10)
GFR calc Af Amer: 18 mL/min — ABNORMAL LOW (ref >90)

## 2011-04-06 LAB — PROTIME-INR: Prothrombin Time: 28.1 seconds — ABNORMAL HIGH (ref 11.6–15.2)

## 2011-04-06 MED ORDER — WARFARIN SODIUM 2 MG PO TABS
2.0000 mg | ORAL_TABLET | Freq: Once | ORAL | Status: AC
  Administered 2011-04-06: 2 mg via ORAL
  Filled 2011-04-06: qty 1

## 2011-04-06 MED ORDER — PIPERACILLIN-TAZOBACTAM IN DEX 2-0.25 GM/50ML IV SOLN
2.2500 g | Freq: Four times a day (QID) | INTRAVENOUS | Status: DC
  Administered 2011-04-06 – 2011-04-10 (×16): 2.25 g via INTRAVENOUS
  Filled 2011-04-06 (×20): qty 50

## 2011-04-06 MED ORDER — VANCOMYCIN HCL IN DEXTROSE 1-5 GM/200ML-% IV SOLN
1000.0000 mg | INTRAVENOUS | Status: DC
  Administered 2011-04-07 – 2011-04-10 (×4): 1000 mg via INTRAVENOUS
  Filled 2011-04-06 (×4): qty 200

## 2011-04-06 MED ORDER — VANCOMYCIN HCL 1000 MG IV SOLR
1500.0000 mg | Freq: Once | INTRAVENOUS | Status: AC
  Administered 2011-04-06: 1500 mg via INTRAVENOUS
  Filled 2011-04-06: qty 1500

## 2011-04-06 MED ORDER — FUROSEMIDE 10 MG/ML IJ SOLN
60.0000 mg | Freq: Two times a day (BID) | INTRAMUSCULAR | Status: DC
  Administered 2011-04-06 (×2): 60 mg via INTRAVENOUS
  Filled 2011-04-06 (×4): qty 6

## 2011-04-06 NOTE — Progress Notes (Signed)
ANTIBIOTIC CONSULT NOTE - INITIAL  Pharmacy Consult for vancomycin/zosyn, coumadin Indication: rule out pneumonia, afib  Allergies  Allergen Reactions  . Sulfonamide Derivatives     Childhood reaction    Patient Measurements: Height: 5\' 4"  (162.6 cm) Weight: 212 lb 11.9 oz (96.5 kg) IBW/kg (Calculated) : 54.7  Adjusted Body Weight: 67.2 Vital Signs: Temp: 98.5 F (36.9 C) (12/26 0520) Temp src: Oral (12/26 0520) BP: 145/56 mmHg (12/26 0520) Pulse Rate: 64  (12/26 0520) Intake/Output from previous day: 12/25 0701 - 12/26 0700 In: 1319 [P.O.:840; I.V.:225; IV Piggyback:254] Out: 750 [Urine:750]    Labs:  [facility] 04/06/11 0615 04/05/11 0358  WBC 14.8* 11.1*  HGB 9.1* 9.3*  PLT 323 258  LABCREA -- --  CREATININE 2.79* 2.72*   Estimated Creatinine Clearance: 18.7 ml/min (by C-G formula based on Cr of 2.79). Microbiology: Recent Results (from the past 720 hour(s))  CULTURE, BLOOD (ROUTINE X 2)     Status: Normal (Preliminary result)   Collection Time   04/02/11 10:21 PM      Component Value Range Status Comment   Specimen Description BLOOD RIGHT HAND   Final    Special Requests BOTTLES DRAWN AEROBIC AND ANAEROBIC 10CC EA   Final    Setup Time [id number]   Final    Culture     Final    Value:        BLOOD CULTURE RECEIVED NO GROWTH TO DATE CULTURE WILL BE HELD FOR 5 DAYS BEFORE ISSUING A FINAL NEGATIVE REPORT   Report Status PENDING   Incomplete   CULTURE, BLOOD (ROUTINE X 2)     Status: Normal (Preliminary result)   Collection Time   04/02/11 10:29 PM      Component Value Range Status Comment   Specimen Description BLOOD LEFT FOREARM   Final    Special Requests BOTTLES DRAWN AEROBIC ONLY 10CC   Final    Setup Time [id number]   Final    Culture     Final    Value:        BLOOD CULTURE RECEIVED NO GROWTH TO DATE CULTURE WILL BE HELD FOR 5 DAYS BEFORE ISSUING A FINAL NEGATIVE REPORT   Report Status PENDING   Incomplete   MRSA PCR SCREENING     Status: Abnormal     Collection Time   04/02/11 11:37 PM      Component Value Range Status Comment   MRSA by PCR POSITIVE (*) NEGATIVE  Final   URINE CULTURE     Status: Normal   Collection Time   04/02/11 11:58 PM      Component Value Range Status Comment   Specimen Description URINE, CATHETERIZED   Final    Special Requests     Final    Value: ADDED ON 04/03/11 AT 0053 PATIENT ON FOLLOWING ROCPEHIN   Setup Time [id number]   Final    Colony Count >=100,000 COLONIES/ML   Final    Culture ESCHERICHIA COLI   Final    Report Status 04/05/2011 FINAL   Final    Organism ID, Bacteria ESCHERICHIA COLI   Final      Medications:  Flagyl stopped today after 3 fulls days of therapy, cipro stopped today after 3 full days of tx Assessment: 77 yo w/ CT 12/25 w/ L lung opacity concerning for PNA; 12/22 urine cx + for E coli > 100k.  Afebrile. Web 14.8; creat 2.79. Creat cl ~ 19 ml/min;  S/p 3 full days of flagyl and cipro  tx. INR  2.58; Home dose of Coumadin dose 2mg  MWFSS and 1mg  Tues/Thurs. No bleeding noted.  Goal of Therapy:  Vancomycin trough level 15-20 mcg/ml INR 2-3  Plan:  1. Vancomycin 1500 mg load then vancomycin 1000mg  IV q24 hrs  2. Zosyn 2.25 gm IV q6h 3. Coumadin 2 mg x1 dose today 4. F/u renal function, culture data. INR.   Len Childs T 04/06/2011,9:45 AM Pager: [phone]

## 2011-04-06 NOTE — Progress Notes (Signed)
Subjective: 77 y/o admitted with abdominl pain and distension and noted to be bradycardic down to 40s on admission.  She has had diverticulitis in the past and states that this episode is similar to her last.  She endorses the abdominal pain has markedly improved since admission. She denies shortness of breath or cough.  She feels that her breathing is at its baseline.  Objective: Blood pressure 145/56, pulse 64, temperature 98.5 F (36.9 C), temperature source Oral, resp. rate 18, height 5\' 4"  (1.626 m), weight 96.5 kg (212 lb 11.9 oz), SpO2 94.00%. Weight change: -0.8 kg (-1 lb 12.2 oz)  Intake/Output Summary (Last 24 hours) at 04/06/11 1031 Last data filed at 04/05/11 2358  Gross per 24 hour  Intake   1094 ml  Output    550 ml  Net    544 ml   Physical Exam: General appearance: alert, cooperative and no distress  Head: Normocephalic, without obvious abnormality, atraumatic   Lungs: clear to auscultation bilaterally  Heart: regular rate and rhythm, S1, S2 normal, no murmur, click, rub or gallop  Abdomen: distended, tympanic, soft and tender in left lower quadrant. Bowel sounds are present.  Extremities: extremities normal, atraumatic, no cyanosis or edema   Lab Results:  [facility] 04/06/11 0615 04/05/11 0358  NA 138 139  K 4.7 4.2  CL 104 104  CO2 20 24  GLUCOSE 169* 135*  BUN 66* 68*  CREATININE 2.79* 2.72*  CALCIUM 9.0 8.9  MG -- --  PHOS -- --    Basename 04/06/11 0615 04/05/11 0358  WBC 14.8* 11.1*  NEUTROABS -- --  HGB 9.1* 9.3*  HCT 31.9* 31.3*  MCV 92.7 90.5  PLT 323 258   Micro Results: Recent Results (from the past 240 hour(s))  CULTURE, BLOOD (ROUTINE X 2)     Status: Normal (Preliminary result)   Collection Time   04/02/11 10:21 PM      Component Value Range Status Comment   Specimen Description BLOOD RIGHT HAND   Final    Special Requests BOTTLES DRAWN AEROBIC AND ANAEROBIC 10CC EA   Final    Setup Time [id number]   Final    Culture     Final      Value:        BLOOD CULTURE RECEIVED NO GROWTH TO DATE CULTURE WILL BE HELD FOR 5 DAYS BEFORE ISSUING A FINAL NEGATIVE REPORT   Report Status PENDING   Incomplete   CULTURE, BLOOD (ROUTINE X 2)     Status: Normal (Preliminary result)   Collection Time   04/02/11 10:29 PM      Component Value Range Status Comment   Specimen Description BLOOD LEFT FOREARM   Final    Special Requests BOTTLES DRAWN AEROBIC ONLY 10CC   Final    Setup Time [id number]   Final    Culture     Final    Value:        BLOOD CULTURE RECEIVED NO GROWTH TO DATE CULTURE WILL BE HELD FOR 5 DAYS BEFORE ISSUING A FINAL NEGATIVE REPORT   Report Status PENDING   Incomplete   MRSA PCR SCREENING     Status: Abnormal   Collection Time   04/02/11 11:37 PM      Component Value Range Status Comment   MRSA by PCR POSITIVE (*) NEGATIVE  Final   URINE CULTURE     Status: Normal   Collection Time   04/02/11 11:58 PM      Component Value Range  Status Comment   Specimen Description URINE, CATHETERIZED   Final    Special Requests     Final    Value: ADDED ON 04/03/11 AT 0053 PATIENT ON FOLLOWING ROCPEHIN   Setup Time [id number]   Final    Colony Count >=100,000 COLONIES/ML   Final    Culture ESCHERICHIA COLI   Final    Report Status 04/05/2011 FINAL   Final    Organism ID, Bacteria ESCHERICHIA COLI   Final    Assessment/Plan:  Acute hypoxemic respiratory failure/question healthcare acquired pneumonia Has developed progressive hypoxemia over the past 24 hours with abnormal findings on chest x-ray concerning for left lower lobe atelectasis versus evolving pneumonia.  It is also appreciated however that this patient has severe COPD and likely has unobserved hypoxic spells on a frequent basis at home.  Given an associated elevated white blood cell count we will broaden her antibiotic coverage to Zosyn and vancomycin to treat for a possible healthcare associated pneumonia. We will check a two-view chest x-ray in the  morning.  Acute diffuse Diverticlulitis of the jejunum  There was no evidence of perforation or abscess formation on the initial CT scan of the abdomen. Given concerns over possible healthcare acquired pneumonia we will discontinue Cipro and flagyl. Due to persistent ileus as well as continued abdominal pain and now leukocytosis we will not advance the diet past clears. We will also repeat CT scan of the abdomen and pelvis with oral contrast especially in the setting of persistent abdominal pain and leukocytosis.  Atrial fibrillation with Bradycardia  The patient's bradycardia was likely due to a medication response possibly worsened due to high vagal tone in the setting of persistent abdominal pain and nausea-her heart rate has improved to 60's and 70s upon holding her rate controlling medications.  BP was increasing therefore Norvasc was resumed 04/05/2011. Her blood pressure has markedly improved and her heart rate remains in the 60s therefore we'll continue to hold Metoprolol. Continue to monitor HR on telemetry.   Hypotension  RESOLVED  Acute renal failure/ hyperkalemia superimposed  related to volume depletion and hypovolemia- improving  RENAL FAILURE, CHRONIC stage 4/ANCA+ pauci-immune glomerulonephritis  Baseline creatinine is reported to be 2.4  DIABETES MELLITUS, TYPE II  Reasonably controlled at the present time-follow   DIASTOLIC HEART FAILURE, CHRONIC  Repeat CXR 04/05/2011 was concerning for pulmonary edema therefore Lasix was resumed 40 mg IV every 12 hours and IV fluids were discontinued. She still remains in a positive fluid balance so either the x-ray changes were more consistent with noncardiogenic edema or her Lasix dosage is not high enough given her chronic kidney disease. We will go ahead and increase the Lasix to 60 mg IV every 12 hours and monitor response.  Chronic hypoxemic respiratory failure secondary to COPD  on home O2 continuously at 2 L per minute-diminished  inspiratory effort related to abdominal pain is currently felt to be contributing to persistent hypoxia and increased O2 requirements-will add incentive spirometry.   LOS: 4 days   ELLIS,ALLISON L. [phone] 04/06/2011, 10:31 AM  I have personally examined this patient and reviewed the entire database. I have reviewed the above note, made any necessary editorial changes, and agree with its content.  Lonia Blood, MD [facility]

## 2011-04-06 NOTE — Progress Notes (Signed)
PT Cancellation Note Attempted PT today.  Patient politely refused after maximal encouragement to participate.  (Pt had just returned from CT).  Discussed with patient importance of mobility. Will return at a later time. Durenda Hurt Renaldo Fiddler, [facility] Acute Rehab Services Pager [phone]

## 2011-04-06 NOTE — Progress Notes (Signed)
Pt resting in bed SaO2 75% on 4LNC. Increased to [facility] and repositioned; SaO2 82%. Administered Albuterol nebulizer per order at 0526. Pt pt complaints during this occurrence. SaO2 during breathing treatment reached 93%. Other vital signs remain stable. Pt SaO2 93% currently. Told pt to call. Call light within reach. Will continue to monitor

## 2011-04-06 NOTE — Progress Notes (Signed)
PT SaO2 level quickly trended back down to 82% on 4LNC. No pt complaints. Lung sounds the same as earlier (clear in the upper lobes, diminished in the bases). Through encouraged deep breathing pt slowly increased SaO2 to 90%. No pt complaints. No signs of distress. Will continue to monitor

## 2011-04-07 ENCOUNTER — Ambulatory Visit: Payer: Medicare Other

## 2011-04-07 ENCOUNTER — Inpatient Hospital Stay (HOSPITAL_COMMUNITY): Payer: Medicare Other

## 2011-04-07 DIAGNOSIS — A419 Sepsis, unspecified organism: Secondary | ICD-10-CM

## 2011-04-07 DIAGNOSIS — I509 Heart failure, unspecified: Secondary | ICD-10-CM

## 2011-04-07 DIAGNOSIS — R6510 Systemic inflammatory response syndrome (SIRS) of non-infectious origin without acute organ dysfunction: Secondary | ICD-10-CM | POA: Diagnosis present

## 2011-04-07 DIAGNOSIS — K5732 Diverticulitis of large intestine without perforation or abscess without bleeding: Secondary | ICD-10-CM

## 2011-04-07 DIAGNOSIS — J9811 Atelectasis: Secondary | ICD-10-CM | POA: Diagnosis not present

## 2011-04-07 DIAGNOSIS — J9620 Acute and chronic respiratory failure, unspecified whether with hypoxia or hypercapnia: Secondary | ICD-10-CM | POA: Diagnosis not present

## 2011-04-07 DIAGNOSIS — E875 Hyperkalemia: Secondary | ICD-10-CM | POA: Diagnosis not present

## 2011-04-07 DIAGNOSIS — J9600 Acute respiratory failure, unspecified whether with hypoxia or hypercapnia: Secondary | ICD-10-CM

## 2011-04-07 LAB — PROTIME-INR: Prothrombin Time: 36.7 seconds — ABNORMAL HIGH (ref 11.6–15.2)

## 2011-04-07 LAB — BLOOD GAS, ARTERIAL
Bicarbonate: 23.9 mEq/L (ref 20.0–24.0)
Drawn by: 258031
Expiratory PAP: 8
Expiratory PAP: 8
FIO2: 0.4 %
Mode: POSITIVE
O2 Content: 4 L/min
O2 Saturation: 86.5 %
O2 Saturation: 93.9 %
Patient temperature: 98.6
Patient temperature: 98.6
TCO2: 24.7 mmol/L (ref 0–100)
pH, Arterial: 7.199 — CL (ref 7.350–7.400)
pH, Arterial: 7.196 — CL (ref 7.350–7.400)
pO2, Arterial: 72.9 mmHg — ABNORMAL LOW (ref 80.0–100.0)

## 2011-04-07 LAB — BASIC METABOLIC PANEL
BUN: 65 mg/dL — ABNORMAL HIGH (ref 6–23)
CO2: 24 mEq/L (ref 19–32)
GFR calc non Af Amer: 15 mL/min — ABNORMAL LOW (ref >90)
Glucose, Bld: 103 mg/dL — ABNORMAL HIGH (ref 70–99)
Potassium: 4.5 mEq/L (ref 3.5–5.1)

## 2011-04-07 LAB — POCT I-STAT 3, ART BLOOD GAS (G3+)
Acid-base deficit: 3 mmol/L — ABNORMAL HIGH (ref 0.0–2.0)
Patient temperature: 98.7
Patient temperature: 98.6
TCO2: 28 mmol/L (ref 0–100)
pCO2 arterial: 65.6 mmHg (ref 35.0–45.0)
pH, Arterial: 7.205 — ABNORMAL LOW (ref 7.350–7.400)
pH, Arterial: 7.288 — ABNORMAL LOW (ref 7.350–7.400)

## 2011-04-07 LAB — COMPREHENSIVE METABOLIC PANEL
ALT: 14 U/L (ref 0–35)
AST: 12 U/L (ref 0–37)
Albumin: 2.2 g/dL — ABNORMAL LOW (ref 3.5–5.2)
CO2: 23 mEq/L (ref 19–32)
Calcium: 9 mg/dL (ref 8.4–10.5)
GFR calc non Af Amer: 15 mL/min — ABNORMAL LOW (ref >90)
Sodium: 137 mEq/L (ref 135–145)
Total Protein: 6.1 g/dL (ref 6.0–8.3)

## 2011-04-07 LAB — CBC
MCH: 26.3 pg (ref 26.0–34.0)
Platelets: 453 10*3/uL — ABNORMAL HIGH (ref 150–400)
RBC: 3.76 MIL/uL — ABNORMAL LOW (ref 3.87–5.11)
RDW: 17.6 % — ABNORMAL HIGH (ref 11.5–15.5)

## 2011-04-07 LAB — LACTIC ACID, PLASMA: Lactic Acid, Venous: 0.8 mmol/L (ref 0.5–2.2)

## 2011-04-07 LAB — GLUCOSE, CAPILLARY: Glucose-Capillary: 101 mg/dL — ABNORMAL HIGH (ref 70–99)

## 2011-04-07 LAB — PROCALCITONIN: Procalcitonin: 3.42 ng/mL

## 2011-04-07 MED ORDER — FENTANYL CITRATE 0.05 MG/ML IJ SOLN
100.0000 ug | INTRAMUSCULAR | Status: DC | PRN
  Administered 2011-04-07 – 2011-04-08 (×3): 100 ug via INTRAVENOUS
  Filled 2011-04-07 (×2): qty 2

## 2011-04-07 MED ORDER — FENTANYL CITRATE 0.05 MG/ML IJ SOLN
100.0000 ug | Freq: Once | INTRAMUSCULAR | Status: AC
Start: 2011-04-07 — End: 2011-04-07
  Administered 2011-04-07 (×2): 100 ug via INTRAVENOUS

## 2011-04-07 MED ORDER — MIDAZOLAM HCL 2 MG/2ML IJ SOLN
INTRAMUSCULAR | Status: AC
  Filled 2011-04-07: qty 4

## 2011-04-07 MED ORDER — SODIUM CHLORIDE 0.9 % IV SOLN
INTRAVENOUS | Status: DC
  Administered 2011-04-07: 50 mL/h via INTRAVENOUS
  Administered 2011-04-08: 19:00:00 via INTRAVENOUS
  Administered 2011-04-09: 20 mL/h via INTRAVENOUS
  Administered 2011-04-10: 22:00:00 via INTRAVENOUS
  Administered 2011-04-12 – 2011-04-16 (×2): 20 mL/h via INTRAVENOUS

## 2011-04-07 MED ORDER — MIDAZOLAM HCL 2 MG/2ML IJ SOLN
2.0000 mg | Freq: Once | INTRAMUSCULAR | Status: AC
  Administered 2011-04-07: 2 mg via INTRAVENOUS

## 2011-04-07 MED ORDER — CHLORHEXIDINE GLUCONATE 0.12 % MT SOLN
15.0000 mL | Freq: Four times a day (QID) | OROMUCOSAL | Status: DC
  Administered 2011-04-08 (×2): 15 mL via OROMUCOSAL
  Filled 2011-04-07 (×3): qty 15

## 2011-04-07 MED ORDER — FENTANYL CITRATE 0.05 MG/ML IJ SOLN
INTRAMUSCULAR | Status: AC
  Administered 2011-04-07: 100 ug via INTRAVENOUS
  Filled 2011-04-07: qty 4

## 2011-04-07 MED ORDER — ROCURONIUM BROMIDE 50 MG/5ML IV SOLN
0.6000 mg/kg | Freq: Once | INTRAVENOUS | Status: AC
  Administered 2011-04-07: 50 mg via INTRAVENOUS

## 2011-04-07 MED ORDER — PROPOFOL 10 MG/ML IV EMUL
5.0000 ug/kg/min | INTRAVENOUS | Status: DC
  Administered 2011-04-07 – 2011-04-08 (×3): 5 ug/kg/min via INTRAVENOUS
  Administered 2011-04-09: 3 ug/kg/min via INTRAVENOUS
  Filled 2011-04-07 (×4): qty 100

## 2011-04-07 MED ORDER — BIOTENE DRY MOUTH MT LIQD: 15.0000 mL | OROMUCOSAL | Status: DC | PRN

## 2011-04-07 MED ORDER — ETOMIDATE 2 MG/ML IV SOLN
10.0000 mg | Freq: Once | INTRAVENOUS | Status: AC
  Administered 2011-04-07: 10 mg via INTRAVENOUS

## 2011-04-07 MED ORDER — INSULIN ASPART 100 UNIT/ML ~~LOC~~ SOLN
0.0000 [IU] | SUBCUTANEOUS | Status: DC
  Administered 2011-04-07: 1 [IU] via SUBCUTANEOUS
  Administered 2011-04-10: 2 [IU] via SUBCUTANEOUS
  Administered 2011-04-11: 5 [IU] via SUBCUTANEOUS
  Administered 2011-04-11: 3 [IU] via SUBCUTANEOUS
  Filled 2011-04-07: qty 3

## 2011-04-07 NOTE — Procedures (Signed)
Arterial Catheter Insertion Procedure Note [name] [id number] 04/24/1930  Procedure: Insertion of Arterial Catheter  Indications: Blood pressure monitoring  Procedure Details Consent: Risks of procedure as well as the alternatives and risks of each were explained to the (patient/caregiver).  Consent for procedure obtained. Time Out: Verified patient identification, verified procedure, site/side was marked, verified correct patient position, special equipment/implants available, medications/allergies/relevent history reviewed, required imaging and test results available.  Performed  Maximum sterile technique was used including antiseptics, cap, gloves, gown, hand hygiene, mask and sheet. Skin prep: Chlorhexidine; local anesthetic administered 20 gauge catheter was inserted into right radial artery using the Seldinger technique.  Evaluation Blood flow good; BP tracing good. Complications: No apparent complications.   [name] 04/07/2011

## 2011-04-07 NOTE — Procedures (Signed)
Intubation Procedure Note ORTENCIA ASKARI [id number] [birth year]  Procedure: Intubation Indications: Airway protection and maintenance  Procedure Details Consent: Risks of procedure as well as the alternatives and risks of each were explained to the (patient/caregiver).  Consent for procedure obtained. Time Out: Verified patient identification, verified procedure, site/side was marked, verified correct patient position, special equipment/implants available, medications/allergies/relevent history reviewed, required imaging and test results available.  Performed  Maximum sterile technique was used including antiseptics, cap, gloves, hand hygiene and mask.  MAC and 3    Evaluation Hemodynamic Status: BP stable throughout; O2 sats: stable throughout Patient's Current Condition: stable Complications: No apparent complications Patient did tolerate procedure well. Chest X-ray ordered to verify placement.  CXR: pending.   [name] 04/07/2011

## 2011-04-07 NOTE — Procedures (Signed)
Central Venous Catheter Insertion Procedure Note [name] [id number] 1930/09/15  Procedure: Insertion of Central Venous Catheter Indications: Assessment of intravascular volume and Drug and/or fluid administration  Procedure Details Consent: Risks of procedure as well as the alternatives and risks of each were explained to the (patient/caregiver).  Consent for procedure obtained. Time Out: Verified patient identification, verified procedure, site/side was marked, verified correct patient position, special equipment/implants available, medications/allergies/relevent history reviewed, required imaging and test results available.  Performed  Maximum sterile technique was used including antiseptics, cap, gloves, gown, hand hygiene, mask and sheet. Skin prep: Chlorhexidine; local anesthetic administered A antimicrobial bonded/coated triple lumen catheter was placed in the right internal jugular vein using the Seldinger technique.  Evaluation Blood flow good Complications: No apparent complications Patient did tolerate procedure well. Chest X-ray ordered to verify placement.  CXR: pending.   Procedure performed under direct supervision of Dr. Molli Knock and with ultrasound guidance.    Heidi Brim, NP-C [facility] Pgr: [phone]
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
## 2011-04-07 NOTE — Progress Notes (Signed)
CRITICAL VALUE ALERT  Critical value received:  PH 7.22, CO2 61, P02 73, HCO3 23.9, BASE DEFECIT -2.8  Date of notification:  04/07/2011   Time of notification:  4:06 PM   Critical value read back:yes  Nurse who received alert:  Allyn Kenner   MD notified (1st page):    Time of first page:    MD notified (2nd page):  Time of second page:  Responding MD:  Marilynne Drivers, NP  Time MD responded:  4:06 PM   PLAN AT THIS TIME: TRANSFER TO ICU AND INTUBATE PER CCM

## 2011-04-07 NOTE — Consult Note (Signed)
Reason for Consult:Diverticulitis Referring Physician: [name] is an 77 y.o. female.  HPI: Patient is a 77 year old female with multiple medical problems. She was admitted for abdominal discomfort. She says she's had a problem for week her H&P says about a week and a half prior to admission she was having some abdominal discomfort. On day of admission she woke up with 5 / 10 abdominal pain became worse she developed nausea cold sweats and was brought to the ER by her family. In the emergency room she was found to be bradycardic with fever up to 103. Potassium was 5.9 BUN was 79 creatinine was 2.99 troponins were negative. Shows white cells in the urine abdominal film was negative chest x-ray showed cardiomegaly and vascular congestion. Since admission she's had ongoing abdominal discomfort. She's been treated for diverticulitis with a history of prior diverticulitis and perforation, May 2010. Since admission she's had a persistent ileus with increasing leukocytosis. Chest x-ray yesterday shows cardiomegaly with mild interstitial edema and bilateral effusions. CT scan yesterday shows worsening acute diverticulitis of the proximal jejunum with 2 fluid collections 2.5 x 3 cm and 1.9 x 2.6 cm. Increasing abdominal wall subcutaneous edema and stranding at the site of the diverticulitis. She also has the appearance of a small bowel obstruction at the level of the acute diverticulitis. General surgery was asked to see in consultation. Additional problems on admission include: COPD on home O2, diastolic congestive heart failure, Chronic kidney disease, sleep apnea, history of anemia, A. fib, gout, hypothyroidism.  Past Medical History  Diagnosis Date  . Diabetes mellitus   . Obesity   . COPD (chronic obstructive pulmonary disease)     Moderate. PFTs (12/10): FVC 67%, FEV1 58%, ratio 60%, TLC 95%, RV 138%, DLCO 43% (moderate obstructive defect). She is on home oxygen that should be worn at all  times.  . Diastolic CHF, chronic     Echo (3/12): EF 55-60%, mild LV hypertrophy, grade I diastolic dysfunction, mild left atrial enlargement, normal pulmonary artery pressure  . CKD (chronic kidney disease)     Cr 2.4 when last checked. ANCA +, pauci-immune glomerulnephritis followed by Dr. Arrie Burch  . PNA (pneumonia)     h/o with ARDS in 2005; MRSA PNA with prolonged hospitalization in [facility] 11/11  . Diverticulum     Jejunal  . OSA (obstructive sleep apnea)     possible. When pt was sedated for TEE in May 2010, she did develop some upper airway obstruction  . Anemia     of chronic disease  . Hypothyroidism   . Gout   . Atrial fibrillation 07/2008    Failed cardioversion with TEE guidance in May 2010. Pt went back to NSR by 11/2008 with amiodarne  . Hypertension   . GERD (gastroesophageal reflux disease)   . Peripheral vascular disease   . Pneumonia   . Chronic kidney disease     Past Surgical History  Procedure Date  . Tracheostomy     ARDS/ ICU admission, trach 2005  . Microperforation 08/2008    Of jejunem, ICU admission   she reports no surgeries aside from tracheostomy.  Family History  Problem Relation Age of Onset  . Emphysema Mother     and asthma  . Asthma Mother   . Heart disease Neg Hx     premature    Social History:  reports that she quit smoking about 43 years ago. She does not have any smokeless tobacco history on file. She  reports that she does not drink alcohol or use illicit drugs.  Allergies:  Allergies  Allergen Reactions  . Sulfonamide Derivatives     Childhood reaction    Medications:  Prior to Admission:  Prescriptions prior to admission  Medication Sig Dispense Refill  . albuterol (PROVENTIL) (2.5 MG/3ML) 0.083% nebulizer solution Take 3 mLs (2.5 mg total) by nebulization every 6 (six) hours as needed for wheezing.  75 mL  12  . albuterol (VENTOLIN HFA) 108 (90 BASE) MCG/ACT inhaler Inhale 2 puffs into the lungs every 4  (four) hours as needed for wheezing.  1 Inhaler  5  . amLODipine (NORVASC) 10 MG tablet Take 10 mg by mouth daily.        . fenofibrate (TRICOR) 48 MG tablet Take 48 mg by mouth daily.       . magnesium oxide (MAG-OX) 400 MG tablet Take 400 mg by mouth 3 (three) times daily.        . metoprolol tartrate (LOPRESSOR) 25 MG tablet Take 25 mg by mouth 2 (two) times daily.        . Mometasone Furo-Formoterol Fum (DULERA) 200-5 MCG/ACT AERO Inhale 2 puffs into the lungs 2 (two) times daily.  1 Inhaler  0  . ranitidine (ZANTAC) 150 MG capsule Take 150 mg by mouth every evening.        . simvastatin (ZOCOR) 20 MG tablet Take 1 tablet (20 mg total) by mouth at bedtime.  90 tablet  3  . tiotropium (SPIRIVA) 18 MCG inhalation capsule Place 1 capsule (18 mcg total) into inhaler and inhale daily.  30 capsule  11  . triamcinolone (KENALOG) 0.1 % cream Apply topically 2 (two) times daily.  454 g  1  . warfarin (COUMADIN) 2 MG tablet Take 2 mg by mouth daily. As directed: need to verify dose       . furosemide (LASIX) 40 MG tablet Take 40 mg by mouth 2 (two) times daily.        . NON FORMULARY OXYGEN. 2 liters at bedtime and as needed during the day.       . predniSONE (DELTASONE) 20 MG tablet Take 40 mg by mouth daily. Leftover from previous course       . warfarin (COUMADIN) 2 MG tablet TAKE ONE TABLET BY MOUTH EVERY DAY AS DIRECTED  30 tablet  5   Scheduled:   . ipratropium  0.5 mg Nebulization QID   And  . albuterol  2.5 mg Nebulization QID  . budesonide  0.25 mg Nebulization BID  . Chlorhexidine Gluconate Cloth  6 each Topical Q0600  . insulin aspart  0-9 Units Subcutaneous Q4H  . mupirocin ointment  1 application Nasal BID  . piperacillin-tazobactam (ZOSYN)  IV  2.25 g Intravenous Q6H  . vancomycin  1,500 mg Intravenous Once  . vancomycin  1,000 mg Intravenous Q24H  . warfarin  2 mg Oral ONCE-1800  . DISCONTD: fenofibrate  54 mg Oral Daily  . DISCONTD: furosemide  60 mg Intravenous Q12H  .  DISCONTD: insulin aspart  0-5 Units Subcutaneous QHS  . DISCONTD: insulin aspart  0-9 Units Subcutaneous TID WC  . DISCONTD: simvastatin  20 mg Oral QHS   Continuous:   . sodium chloride 50 mL/hr (04/07/11 0950)    Results for orders placed during the hospital encounter of 04/02/11 (from the past 48 hour(s))  GLUCOSE, CAPILLARY     Status: Abnormal   Collection Time   04/05/11  4:26 PM  Component Value Range Comment   Glucose-Capillary 134 (*) 70 - 99 (mg/dL)    Comment 1 Documented in Chart      Comment 2 Notify RN     GLUCOSE, CAPILLARY     Status: Abnormal   Collection Time   04/05/11  9:48 PM      Component Value Range Comment   Glucose-Capillary 151 (*) 70 - 99 (mg/dL)   PROTIME-INR     Status: Abnormal   Collection Time   04/06/11  6:15 AM      Component Value Range Comment   Prothrombin Time 28.1 (*) 11.6 - 15.2 (seconds)    INR 2.58 (*) 0.00 - 1.49    BASIC METABOLIC PANEL     Status: Abnormal   Collection Time   04/06/11  6:15 AM      Component Value Range Comment   Sodium 138  135 - 145 (mEq/L)    Potassium 4.7  3.5 - 5.1 (mEq/L)    Chloride 104  96 - 112 (mEq/L)    CO2 20  19 - 32 (mEq/L)    Glucose, Bld 169 (*) 70 - 99 (mg/dL)    BUN 66 (*) 6 - 23 (mg/dL)    Creatinine, Ser 1.61 (*) 0.50 - 1.10 (mg/dL)    Calcium 9.0  8.4 - 10.5 (mg/dL)    GFR calc non Af Amer 15 (*) >90 (mL/min)    GFR calc Af Amer 18 (*) >90 (mL/min)   CBC     Status: Abnormal   Collection Time   04/06/11  6:15 AM      Component Value Range Comment   WBC 14.8 (*) 4.0 - 10.5 (K/uL)    RBC 3.44 (*) 3.87 - 5.11 (MIL/uL)    Hemoglobin 9.1 (*) 12.0 - 15.0 (g/dL)    HCT 09.6 (*) 04.5 - 46.0 (%)    MCV 92.7  78.0 - 100.0 (fL)    MCH 26.5  26.0 - 34.0 (pg)    MCHC 28.5 (*) 30.0 - 36.0 (g/dL)    RDW 40.9 (*) 81.1 - 15.5 (%)    Platelets 323  150 - 400 (K/uL)   GLUCOSE, CAPILLARY     Status: Abnormal   Collection Time   04/06/11  6:27 AM      Component Value Range Comment    Glucose-Capillary 153 (*) 70 - 99 (mg/dL)   GLUCOSE, CAPILLARY     Status: Abnormal   Collection Time   04/06/11 11:22 AM      Component Value Range Comment   Glucose-Capillary 146 (*) 70 - 99 (mg/dL)   GLUCOSE, CAPILLARY     Status: Abnormal   Collection Time   04/06/11  4:50 PM      Component Value Range Comment   Glucose-Capillary 150 (*) 70 - 99 (mg/dL)   GLUCOSE, CAPILLARY     Status: Abnormal   Collection Time   04/06/11  9:49 PM      Component Value Range Comment   Glucose-Capillary 117 (*) 70 - 99 (mg/dL)   PROTIME-INR     Status: Abnormal   Collection Time   04/07/11  5:15 AM      Component Value Range Comment   Prothrombin Time 36.7 (*) 11.6 - 15.2 (seconds)    INR 3.63 (*) 0.00 - 1.49    CBC     Status: Abnormal   Collection Time   04/07/11  5:15 AM      Component Value Range Comment   WBC  22.8 (*) 4.0 - 10.5 (K/uL)    RBC 3.76 (*) 3.87 - 5.11 (MIL/uL)    Hemoglobin 9.9 (*) 12.0 - 15.0 (g/dL)    HCT 84.1 (*) 32.4 - 46.0 (%)    MCV 93.9  78.0 - 100.0 (fL)    MCH 26.3  26.0 - 34.0 (pg)    MCHC 28.0 (*) 30.0 - 36.0 (g/dL)    RDW 40.1 (*) 02.7 - 15.5 (%)    Platelets 453 (*) 150 - 400 (K/uL)   COMPREHENSIVE METABOLIC PANEL     Status: Abnormal   Collection Time   04/07/11  5:15 AM      Component Value Range Comment   Sodium 137  135 - 145 (mEq/L)    Potassium 5.3 (*) 3.5 - 5.1 (mEq/L)    Chloride 99  96 - 112 (mEq/L)    CO2 23  19 - 32 (mEq/L)    Glucose, Bld 173 (*) 70 - 99 (mg/dL)    BUN 65 (*) 6 - 23 (mg/dL)    Creatinine, Ser 2.53 (*) 0.50 - 1.10 (mg/dL)    Calcium 9.0  8.4 - 10.5 (mg/dL)    Total Protein 6.1  6.0 - 8.3 (g/dL)    Albumin 2.2 (*) 3.5 - 5.2 (g/dL)    AST 12  0 - 37 (U/L)    ALT 14  0 - 35 (U/L)    Alkaline Phosphatase 77  39 - 117 (U/L)    Total Bilirubin 0.2 (*) 0.3 - 1.2 (mg/dL)    GFR calc non Af Amer 15 (*) >90 (mL/min)    GFR calc Af Amer 18 (*) >90 (mL/min)   GLUCOSE, CAPILLARY     Status: Abnormal   Collection Time   04/07/11   6:45 AM      Component Value Range Comment   Glucose-Capillary 165 (*) 70 - 99 (mg/dL)   PROCALCITONIN     Status: Normal   Collection Time   04/07/11  9:15 AM      Component Value Range Comment   Procalcitonin 3.42     LACTIC ACID, PLASMA     Status: Normal   Collection Time   04/07/11  9:19 AM      Component Value Range Comment   Lactic Acid, Venous 0.8  0.5 - 2.2 (mmol/L)   BLOOD GAS, ARTERIAL     Status: Abnormal   Collection Time   04/07/11  9:22 AM      Component Value Range Comment   O2 Content 4.0      Delivery systems NASAL CANNULA      pH, Arterial 7.199 (*) 7.350 - 7.400     pCO2 arterial 60.9 (*) 35.0 - 45.0 (mmHg)    pO2, Arterial 54.8 (*) 80.0 - 100.0 (mmHg)    Bicarbonate 22.8  20.0 - 24.0 (mEq/L)    TCO2 24.7  0 - 100 (mmol/L)    Acid-base deficit 4.1 (*) 0.0 - 2.0 (mmol/L)    O2 Saturation 86.5      Patient temperature 98.6      Collection site RIGHT RADIAL      Drawn by [id number]      Sample type ARTERIAL      Allens test (pass/fail) PASS  PASS      Ct Abdomen Pelvis Wo Contrast  04/06/2011  *RADIOLOGY REPORT*  Clinical Data: Follow-up acute diverticulitis  CT ABDOMEN AND PELVIS WITHOUT CONTRAST  Technique:  Multidetector CT imaging of the abdomen and pelvis was performed following the  standard protocol without intravenous contrast.  Comparison: CT abdomen pelvis - 04/03/2011; 07/29/2008  Findings:  The lack of intravenous contrast limits the ability to evaluate solid abdominal organs.  Redemonstrated acute diverticulitis involving the several loops of proximal aspect of the jejunum. There is stranding within the ventral mesentery with an approximately 2.5 x 3.3 cm fluid collection within the deep ventral mesentery (image 32, series five), previously, 1.9 x 2.6 cm. Several adjacent scattered foci of air are favored to be intraluminal.  No definite pneumoperitoneum.  There is mild dilatation of several loops of small bowel adjacent to the area of acute diverticulitis  with decompression of the distal small bowel loops.  Previously ingested enteric contrast extends to the level of the colon.  The amount of fluid along the left pericolic gutter has minimally increased interval.  This is associated with interval increase in the amount of subcutaneous edema about the lateral abdominal wall. Interval increase in amount of fluid in the pelvic cul-de-sac. There is extensive diverticular disease involving the colon, in particular the sigmoid colon.  Interval increase in amount of perihepatic fluid.  Cholelithiasis.  The bilateral kidneys remain atrophic.  No evidence of urinary obstruction.  Normal noncontrast appearance of the bilateral adrenal glands and pancreas.  The spleen is noted to be diminutive but otherwise normal.  Scattered calcified atherosclerotic disease within a normal caliber abdominal aorta.  Scattered retroperitoneal lymph nodes are not enlarged by CT criteria.  Redemonstrated bladder diverticuli.  Limited visualization of the lower thorax demonstrates interval development of small bilateral effusions and adjacent dependent consolidative opacities, left greater than right, possibly atelectasis or infection not excluded.  Cardiomegaly. Calcifications of the mitral valve annulus.  No pericardial effusion.  No acute or aggressive osseous abnormalities.  Stable support vertebral body fusion mild to moderate multilevel thoracic and lumbar spine degenerative change, worse at L3-L4 and L5 - S1.  IMPRESSION: 1. Overall findings suggestive of worsening acute diverticulitis of the proximal jejunum with increase in the amount of fluid within the left paracolic gutter, about liver and extending into the pelvis.  There is a small fluid collection within the deep ventral mesentery which is grossly unchanged in size, measuring 2.5 x 3.3 cm in diameter. Interval increase in amount of subcutaneous edema about the left lateral abdominal wall.  2.  Findings suggestive of early/partial  small bowel obstruction about the level of acute diverticulitis.  3.  Interval increase in size of bilateral pleural effusions and adjacent consolidative opacities, left greater than right, possibly atelectasis although underlying infection is not excluded.  4.  Cholelithiasis.  Original Report Authenticated By: Waynard Reeds, M.D.   Dg Chest 2 View  04/06/2011  *RADIOLOGY REPORT*  Clinical Data: Hypoxia, abdominal pain  CHEST - 2 VIEW  Comparison: 04/05/2011  Findings: Cardiomegaly with suspected mild interstitial edema and bilateral lower lobe airspace opacities, likely a combination of atelectasis and pleural effusions.  IMPRESSION: Cardiomegaly with suspected mild interstitial edema and bilateral pleural effusions.  Original Report Authenticated By: Charline Bills, M.D.    Review of Systems  Constitutional: Negative.   HENT: Negative.   Eyes: Negative.   Cardiovascular: Negative.   Gastrointestinal: Positive for abdominal pain. Negative for nausea, vomiting, diarrhea and constipation.  Genitourinary: Negative.   Musculoskeletal: Negative.        Needs a walker to ambulate prior to admission.  Skin: Negative.   Neurological: Negative.   Endo/Heme/Allergies: Bruises/bleeds easily.  Psychiatric/Behavioral: Negative.    Blood pressure 130/44, pulse 83, temperature  98.2 F (36.8 C), temperature source Oral, resp. rate 12, height 5\' 4"  (1.626 m), weight 98 kg (216 lb 0.8 oz), SpO2 92.00%. Physical Exam  Constitutional: She is oriented to person, place, and time. No distress.       Morbidly obese WF on BiPap.  Alert,knows where she is. Sats 92% 40% FiO2.  HENT:  Head: Atraumatic.  Eyes: Conjunctivae and EOM are normal. Pupils are equal, round, and reactive to light.  Neck: No JVD present. No tracheal deviation present. No thyromegaly present.  Cardiovascular: Normal heart sounds.  Exam reveals no friction rub.   No murmur heard.      Afib, HR 77  GI: Bowel sounds are normal. She  exhibits distension. She exhibits no mass. There is no tenderness. There is no rebound and no guarding.  Musculoskeletal: She exhibits edema (LLE with stasis changes.). She exhibits no tenderness.  Lymphadenopathy:    She has no cervical adenopathy.  Neurological: She is alert and oriented to person, place, and time. No cranial nerve deficit.  Skin: Skin is warm and dry. No rash noted. She is not diaphoretic. No erythema. No pallor.       ecchymosis  Psychiatric: She has a normal mood and affect. Her behavior is normal.    Assessment/Plan: 1. Severe diverticulitis of the jejunum with ileus vs SBO. 2. Acute hypoxic respiratory failure, now on BiPAP, probable pneumonia. 3. Atrial fibrillation, admitted with bradycardia. 4. Chronic renal insufficiency stage IV with ANCA positive glomerulonephritis. 5. It also diabetes mellitus 6. Severe COPD on home O2. 7. Morbid obesity with a BMI of 33.7 Plan: Agree with current plans for medical management. She does not have an acute abdomen. Continue antibiotics and bowel rest. If she were not on BiPAP I would recommend NG placement for decompression. She is currently on Zosyn and vancomycin. Continue hydration for renal insufficiency. We will follow with you. Dr. Lindie Spruce has reviewed the CT scan. Will [facility] physician assistant for Dr. Jimmye Norman.  [name] 04/07/2011, 12:04 PM

## 2011-04-07 NOTE — Procedures (Signed)
Central Venous Catheter Insertion Procedure Note SANAZ SCARLETT [id number] 09-09-1930  Procedure: Insertion of Central Venous Catheter Indications: Assessment of intravascular volume and Drug and/or fluid administration  Procedure Details Consent: Risks of procedure as well as the alternatives and risks of each were explained to the (patient/caregiver).  Consent for procedure obtained. Time Out: Verified patient identification, verified procedure, site/side was marked, verified correct patient position, special equipment/implants available, medications/allergies/relevent history reviewed, required imaging and test results available.  Performed  Maximum sterile technique was used including antiseptics, cap, gloves, gown, hand hygiene, mask and sheet. Skin prep: Chlorhexidine; local anesthetic administered A antimicrobial bonded/coated triple lumen catheter was placed in the left internal jugular vein using the Seldinger technique.  Evaluation Blood flow good Complications: No apparent complications Patient did tolerate procedure well. Chest X-ray ordered to verify placement.  CXR: pending.  U/S used in placement, picture in chart.  Echo Allsbrook 04/07/2011, 5:38 PM

## 2011-04-07 NOTE — Consult Note (Signed)
Name: [name] MRN: [id number] DOB: Jan 24, 1931    LOS: 5  [place] [facility]  History of Present Illness:  77 year old white female with multiple medical co-morbids: COPD w/ chronic resp failure (O2 dependant 2 liters), CRI w/ BL scr of ~2.55, MO, ANCA positive pauci-immune GN, prior ARDS required trach, CAF and prior perforation of jejunum. Presented on 12/22 with ~ 1-1.5 wk h/o abd pain, particularly over RLQ.Dx eval demonstrated: severe diverticulitis of jejunum. She was was admitted to medical service. Rx has included IVFs, and empiric antibiotics. On 12/26 developed progressive hypoxia and antibiotic selection was widened to cover for possible HCAP.  On 7/27 she developed progressive MS change, ABG showed mixed respiratory and metabolic acidosis. She was placed on NIPPV, PCCM asked to eval .     Lines / Drains:  Cultures: 12/22 MRSA: positive 12/22 BCX2>>> 12/22 UC: e coli (>100K/pan sens) procalcitonin 12/27>>> Lactate 12/27>>> procalcitonin 12/27>>>  Antibiotics: cipro 12/22>>>12/26 Flagyl 12/22>>>12/26 Zosyn (sepsis) 12/26>>> vanc (sepsis) 12/26>>>  Tests / Events: CT abd 12/26: 1. Overall findings suggestive of worsening acute diverticulitis of  the proximal jejunum with increase in the amount of fluid within the left paracolic gutter, about liver and extending into the  pelvis. There is a small fluid collection within the deep ventral  mesentery which is grossly unchanged in size, measuring 2.5 x 3.3  cm in diameter. Interval increase in amount of subcutaneous edema  about the left lateral abdominal wall. 2. Findings suggestive of early/partial small bowel obstruction about the level of acute diverticulitis.  3. Interval increase in size of bilateral pleural effusions and adjacent consolidative opacities, left greater than right, possibly atelectasis although underlying infection is not excluded. 4. Cholelithiasis.    HPI 77 year old white female with  multiple medical co-morbids: COPD w/ chronic resp failure (O2 dependant 2 liters), CRI w/ BL scr of ~2.55, MO, ANCA positive pauci-immune GN, prior ARDS required trach, CAF and prior perforation of jejunum. Presented on 12/22 with ~ 1-1.5 wk h/o abd pain, particularly over RLQ.Dx eval demonstrated: severe diverticulitis of jejunum. She was was admitted to medical service. Rx has included IVFs, and empiric antibiotics. On 12/26 developed progressive hypoxia and antibiotic selection was widened to cover for possible HCAP.  On 7/27 she developed progressive MS change, ABG showed mixed respiratory and metabolic acidosis. She was placed on NIPPV, PCCM asked to eval .   Past Medical History  Diagnosis Date  . Diabetes mellitus   . Obesity   . COPD (chronic obstructive pulmonary disease)     Moderate. PFTs (12/10): FVC 67%, FEV1 58%, ratio 60%, TLC 95%, RV 138%, DLCO 43% (moderate obstructive defect). She is on home oxygen that should be worn at all times.  . Diastolic CHF, chronic     Echo (3/12): EF 55-60%, mild LV hypertrophy, grade I diastolic dysfunction, mild left atrial enlargement, normal pulmonary artery pressure  . CKD (chronic kidney disease)     Cr 2.4 when last checked. 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 PNA (pneumonia)     h/o with ARDS in 2005; MRSA PNA with prolonged hospitalization in [facility] 11/11  . Diverticulum     Jejunal  . OSA (obstructive sleep apnea)     possible. When pt was sedated for TEE in May 2010, she did develop some upper airway obstruction  . Anemia     of chronic disease  . Hypothyroidism   . Gout   . Atrial fibrillation  07/2008    Failed cardioversion with TEE guidance in May 2010. Pt went back to NSR by 11/2008 with amiodarne  . Hypertension   . GERD (gastroesophageal reflux disease)   . Peripheral vascular disease   . Pneumonia   . Chronic kidney disease    Past Surgical History  Procedure Date  . Tracheostomy       ARDS/ ICU admission, trach 2005  . Microperforation 08/2008    Of jejunem, ICU admission    #has been intubated in past 4-5 times for acute on chronic respiratory failure.  Prior to Admission medications   Medication Sig Start Date End Date Taking? Authorizing Provider  albuterol (PROVENTIL) (2.5 MG/3ML) 0.083% nebulizer solution Take 3 mLs (2.5 mg total) by nebulization every 6 (six) hours as needed for wheezing. 02/14/11 02/14/12 Yes Spencer Copland, MD  albuterol (VENTOLIN HFA) 108 (90 BASE) MCG/ACT inhaler Inhale 2 puffs into the lungs every 4 (four) hours as needed for wheezing. 02/14/11  Yes Spencer Copland, MD  amLODipine (NORVASC) 10 MG tablet Take 10 mg by mouth daily.     Yes Historical Provider, MD  fenofibrate (TRICOR) 48 MG tablet Take 48 mg by mouth daily.    Yes Historical Provider, MD  magnesium oxide (MAG-OX) 400 MG tablet Take 400 mg by mouth 3 (three) times daily.     Yes Historical Provider, MD  metoprolol tartrate (LOPRESSOR) 25 MG tablet Take 25 mg by mouth 2 (two) times daily.     Yes Historical Provider, MD  Mometasone Furo-Formoterol Fum (DULERA) 200-5 MCG/ACT AERO Inhale 2 puffs into the lungs 2 (two) times daily. 02/14/11  Yes Spencer Copland, MD  ranitidine (ZANTAC) 150 MG capsule Take 150 mg by mouth every evening.     Yes Historical Provider, MD  simvastatin (ZOCOR) 20 MG tablet Take 1 tablet (20 mg total) by mouth at bedtime. 01/20/11 01/20/12 Yes Spencer Copland, MD  tiotropium (SPIRIVA) 18 MCG inhalation capsule Place 1 capsule (18 mcg total) into inhaler and inhale daily. 02/14/11  Yes Spencer Copland, MD  triamcinolone (KENALOG) 0.1 % cream Apply topically 2 (two) times daily. 02/14/11 02/14/12 Yes Spencer Copland, MD  warfarin (COUMADIN) 2 MG tablet Take 2 mg by mouth daily. As directed: need to verify dose    Yes Historical Provider, MD  furosemide (LASIX) 40 MG tablet Take 40 mg by mouth 2 (two) times daily.      Historical Provider, MD  NON FORMULARY OXYGEN. 2  liters at bedtime and as needed during the day.     Historical Provider, MD  predniSONE (DELTASONE) 20 MG tablet Take 40 mg by mouth daily. Leftover from previous course  02/14/11   Hannah Beat, MD  warfarin (COUMADIN) 2 MG tablet TAKE ONE TABLET BY MOUTH EVERY DAY AS DIRECTED 11/15/10   Kerby Nora, MD   Allergies Allergies  Allergen Reactions  . Sulfonamide Derivatives     Childhood reaction    Family History Family History  Problem Relation Age of Onset  . Emphysema Mother     and asthma  . Asthma Mother   . Heart disease Neg Hx     premature    Social History  reports that she quit smoking about 43 years ago. She does not have any smokeless tobacco history on file. She reports that she does not drink alcohol or use illicit drugs.  Vital Signs: Temp:  [97.1 F (36.2 C)-98.2 F (36.8 C)] 98.2 F (36.8 C) (12/27 0522) Pulse Rate:  [82-107] 84  (12/27  1500) Resp:  [12-22] 15  (12/27 1500) BP: (110-130)/(37-59) 123/58 mmHg (12/27 1500) SpO2:  [84 %-95 %] 93 % (12/27 1500) FiO2 (%):  [40 %] 40 % (12/27 1302) Weight:  [98 kg (216 lb 0.8 oz)] 216 lb 0.8 oz (98 kg) (12/27 0500)      . sodium chloride 50 mL/hr (04/07/11 0950)   I/O last 3 completed shifts: In: 694 [P.O.:240; IV Piggyback:454] Out: 200 [Urine:200]  Physical Examination: General:  Chronically ill appearing white female, lethargic w/ asterixis when asked to hold out hands.   Neuro:  Speech slurred. Oriented X3, somnolent, but arousable  HEENT:  Mucous membranes are dry, No JVD Neck:  + neck vein distention Cardiovascular:  Regular irregular, AF on tele Lungs: diffuse rhonchi, decreased in bases Abdomen:  Distended, firm to palp. Marked rebound tenderness to RLQ Musculoskeletal:  No sig edema, brisk CR Skin:  Chronic venous stasis changes.   Ventilator settings: Vent Mode:  [-]  FiO2 (%):  [40 %] 40 %  Labs and Imaging:   Lab 04/07/11 0515 04/06/11 0615 04/05/11 0358  NA 137 138 139  K 5.3* 4.7  4.2  CL 99 104 104  CO2 23 20 24   BUN 65* 66* 68*  CREATININE 2.80* 2.79* 2.72*  GLUCOSE 173* 169* 135*    Lab 04/07/11 0515 04/06/11 0615 04/05/11 0358  HGB 9.9* 9.1* 9.3*  HCT 35.3* 31.9* 31.3*  WBC 22.8* 14.8* 11.1*  PLT 453* 323 258   Lab Results  Component Value Date   INR 3.63* 04/07/2011   INR 2.58* 04/06/2011   INR 2.39* 04/05/2011   ABG    Component Value Date/Time   PHART 7.196* 04/07/2011 1330   PCO2ART 61.3* 04/07/2011 1330   PO2ART 85.8 04/07/2011 1330   HCO3 22.8 04/07/2011 1330   TCO2 24.7 04/07/2011 1330   ACIDBASEDEF 4.2* 04/07/2011 1330   O2SAT 96.0 04/07/2011 1330   PCXR: CM, bibasilar volume loss w/ marked ATX and some degree of effusion  Assessment and Plan:  Diverticulitis of jejunum with resultant SIRS/sepsis: this has gotten worse since admit and now complicated by ileus. Surgery following. CT does show small fluid collection. If WBC ct cont to rise and no improvement in procalcitonin may need to consider follow up CT in next 48 hrs. There is a noted fluid collection which may warrant drainage by vs surgical exploration.   Lab 04/07/11 0515 04/06/11 0615 04/05/11 0358  WBC 22.8* 14.8* 11.1*    Lab 04/07/11 0915  PROCALCITON 3.42  plan: -transfer to ICU -central access -continue current abx -follow up flat plate of abd  -place NGT for decompression -check lactate  Acute-on-chronic respiratory failure in setting of  COPD now acutelydecompensating in the setting of hypoventilation d/t obesity, abd pain and Atelectasis: failed NIPPV.  Plan: -intubate, ventilate -scheduled BDs -follow up abg and cxr  Atrial fibrillation,   Plan: -rate control -will stop coumadin -when   Coumadin induced coagulopathy Lab Results  Component Value Date   INR 3.63* 04/07/2011   INR 2.58* 04/06/2011   INR 2.39* 04/05/2011  plan: -stop coumadin, heparin when INR <2, given possible need for surgical intervention  DIASTOLIC HEART FAILURE,  CHRONIC Plan: -BP control  Acute on chronic renal failure: baseline ~2.55-2.6  Recent Labs  Basename 04/07/11 0515 04/06/11 0615 04/05/11 0358   CREATININE 2.80* 2.79* 2.72*  plan -central access -avoid nephrotoxins -follow up chemistry  DIABETES MELLITUS, TYPE II Plan: -ssi Encephalopathy/ transient and multifactorial d/t hypercarbia and sepsis.  Plan: -  supportive care   Hyperkalemia: suspect acidosis contributing.   Lab 04/07/11 0515 04/06/11 0615 04/05/11 0358  K 5.3* 4.7 4.2  plan: -f/u bmp after intubation    Best practices / Disposition: -->ICU status under PCCM -->full code -->Heparin for AF after INR <2.  -->Protonix for GI Px -->ventilator bundle -->diet: NPO, will need TNA.  -->family updated at bedside -->code status: full code. Does not want trach.   BABCOCK,PETE 04/07/2011, 4:01 PM  Multifactorial respiratory failure with abdominal distension, developing acidosis and mental status changes.  Will bring to ICU and intubate, mechanically ventilate and treat abdominal concern medically.  CCS following.   CC 90 minutes.  Patient seen and examined, agree with above note.  I dictated the care and orders written for this patient under my direction.  [name], M.D.

## 2011-04-07 NOTE — Progress Notes (Addendum)
ANTICOAGULATION CONSULT NOTE - Follow Up Consult  Pharmacy Consult for Coumadin Indication: atrial fibrillation  Allergies  Allergen Reactions  . Sulfonamide Derivatives     Childhood reaction    Patient Measurements: Height: 5\' 4"  (162.6 cm) Weight: 216 lb 0.8 oz (98 kg) IBW/kg (Calculated) : 54.7  Adjusted Body Weight:    Vital Signs: Temp: 98.2 F (36.8 C) (12/27 0522) Temp src: Oral (12/27 0522) BP: 121/54 mmHg (12/27 0522) Pulse Rate: 91  (12/27 0522)  Labs:  Basename 04/07/11 0515 04/06/11 0615 04/05/11 0358  HGB 9.9* 9.1* --  HCT 35.3* 31.9* 31.3*  PLT 453* 323 258  APTT -- -- --  LABPROT 36.7* 28.1* 26.5*  INR 3.63* 2.58* 2.39*  HEPARINUNFRC -- -- --  CREATININE 2.80* 2.79* 2.72*  CKTOTAL -- -- --  CKMB -- -- --  TROPONINI -- -- --   Estimated Creatinine Clearance: 18.8 ml/min (by C-G formula based on Cr of 2.8).   Medications:  Prescriptions prior to admission  Medication Sig Dispense Refill  . albuterol (PROVENTIL) (2.5 MG/3ML) 0.083% nebulizer solution Take 3 mLs (2.5 mg total) by nebulization every 6 (six) hours as needed for wheezing.  75 mL  12  . albuterol (VENTOLIN HFA) 108 (90 BASE) MCG/ACT inhaler Inhale 2 puffs into the lungs every 4 (four) hours as needed for wheezing.  1 Inhaler  5  . amLODipine (NORVASC) 10 MG tablet Take 10 mg by mouth daily.        . fenofibrate (TRICOR) 48 MG tablet Take 48 mg by mouth daily.       . magnesium oxide (MAG-OX) 400 MG tablet Take 400 mg by mouth 3 (three) times daily.        . metoprolol tartrate (LOPRESSOR) 25 MG tablet Take 25 mg by mouth 2 (two) times daily.        . Mometasone Furo-Formoterol Fum (DULERA) 200-5 MCG/ACT AERO Inhale 2 puffs into the lungs 2 (two) times daily.  1 Inhaler  0  . ranitidine (ZANTAC) 150 MG capsule Take 150 mg by mouth every evening.        . simvastatin (ZOCOR) 20 MG tablet Take 1 tablet (20 mg total) by mouth at bedtime.  90 tablet  3  . tiotropium (SPIRIVA) 18 MCG inhalation  capsule Place 1 capsule (18 mcg total) into inhaler and inhale daily.  30 capsule  11  . triamcinolone (KENALOG) 0.1 % cream Apply topically 2 (two) times daily.  454 g  1  . warfarin (COUMADIN) 2 MG tablet Take 2 mg by mouth daily. As directed: need to verify dose       . furosemide (LASIX) 40 MG tablet Take 40 mg by mouth 2 (two) times daily.        . NON FORMULARY OXYGEN. 2 liters at bedtime and as needed during the day.       . predniSONE (DELTASONE) 20 MG tablet Take 40 mg by mouth daily. Leftover from previous course       . warfarin (COUMADIN) 2 MG tablet TAKE ONE TABLET BY MOUTH EVERY DAY AS DIRECTED  30 tablet  5    Assessment: 77 y/o female on Coumadin PTA for h/o afib. INR now up to 3.63 likely due to residual Flagyl hanging around with ARF which can cause major elevations in INR. Hgb stable at 9.9. Platelets climbing to 453. No acute bleeding noted.  Goal of Therapy:  INR 2-3   Plan:  NO Coumadin today.  Merilynn Finland, [employer]  04/07/2011,10:02 AM     Addendum @ 1600  Patient with severe diverticulitis and ?SBO.  Given possible need for surgical intervention, Coumadin d/c'ed and pharmacy consulted to manage heparin gtt once INR < 2.  Plan:  F/u INR and start heparin bridge once INR < 2.   Phillips Climes, PharmD 04/07/2011

## 2011-04-08 ENCOUNTER — Telehealth: Payer: Self-pay | Admitting: Internal Medicine

## 2011-04-08 ENCOUNTER — Inpatient Hospital Stay (HOSPITAL_COMMUNITY): Payer: Medicare Other

## 2011-04-08 DIAGNOSIS — K5732 Diverticulitis of large intestine without perforation or abscess without bleeding: Secondary | ICD-10-CM

## 2011-04-08 DIAGNOSIS — A419 Sepsis, unspecified organism: Secondary | ICD-10-CM

## 2011-04-08 DIAGNOSIS — J9600 Acute respiratory failure, unspecified whether with hypoxia or hypercapnia: Secondary | ICD-10-CM

## 2011-04-08 DIAGNOSIS — I509 Heart failure, unspecified: Secondary | ICD-10-CM

## 2011-04-08 LAB — PROCALCITONIN: Procalcitonin: 2.26 ng/mL

## 2011-04-08 LAB — COMPREHENSIVE METABOLIC PANEL
ALT: 12 U/L (ref 0–35)
Alkaline Phosphatase: 44 U/L (ref 39–117)
CO2: 22 mEq/L (ref 19–32)
Calcium: 7.5 mg/dL — ABNORMAL LOW (ref 8.4–10.5)
Chloride: 106 mEq/L (ref 96–112)
GFR calc Af Amer: 20 mL/min — ABNORMAL LOW (ref >90)
GFR calc non Af Amer: 17 mL/min — ABNORMAL LOW (ref >90)
Glucose, Bld: 81 mg/dL (ref 70–99)
Potassium: 3.9 mEq/L (ref 3.5–5.1)
Sodium: 138 mEq/L (ref 135–145)
Total Bilirubin: 0.2 mg/dL — ABNORMAL LOW (ref 0.3–1.2)

## 2011-04-08 LAB — POCT I-STAT 3, ART BLOOD GAS (G3+)
Acid-base deficit: 3 mmol/L — ABNORMAL HIGH (ref 0.0–2.0)
O2 Saturation: 97 %
Patient temperature: 99.4
pO2, Arterial: 105 mmHg — ABNORMAL HIGH (ref 80.0–100.0)

## 2011-04-08 LAB — GLUCOSE, CAPILLARY
Glucose-Capillary: 89 mg/dL (ref 70–99)
Glucose-Capillary: 103 mg/dL — ABNORMAL HIGH (ref 70–99)
Glucose-Capillary: 92 mg/dL (ref 70–99)

## 2011-04-08 LAB — CBC
Hemoglobin: 8.3 g/dL — ABNORMAL LOW (ref 12.0–15.0)
MCH: 26.1 pg (ref 26.0–34.0)
RBC: 3.18 MIL/uL — ABNORMAL LOW (ref 3.87–5.11)
WBC: 18.3 10*3/uL — ABNORMAL HIGH (ref 4.0–10.5)

## 2011-04-08 LAB — PROTIME-INR
INR: 4.89 — ABNORMAL HIGH (ref 0.00–1.49)
Prothrombin Time: 46.3 seconds — ABNORMAL HIGH (ref 11.6–15.2)

## 2011-04-08 MED ORDER — FLUTICASONE PROPIONATE HFA 110 MCG/ACT IN AERO
2.0000 | INHALATION_SPRAY | Freq: Two times a day (BID) | RESPIRATORY_TRACT | Status: DC
  Administered 2011-04-08 – 2011-04-19 (×22): 2 via RESPIRATORY_TRACT
  Filled 2011-04-08: qty 12

## 2011-04-08 MED ORDER — IPRATROPIUM BROMIDE HFA 17 MCG/ACT IN AERS
2.0000 | INHALATION_SPRAY | RESPIRATORY_TRACT | Status: DC
  Filled 2011-04-08: qty 12.9

## 2011-04-08 MED ORDER — ALBUTEROL SULFATE HFA 108 (90 BASE) MCG/ACT IN AERS
6.0000 | INHALATION_SPRAY | Freq: Four times a day (QID) | RESPIRATORY_TRACT | Status: DC
  Administered 2011-04-08 – 2011-04-10 (×9): 6 via RESPIRATORY_TRACT
  Filled 2011-04-08: qty 6.7

## 2011-04-08 MED ORDER — BIOTENE DRY MOUTH MT LIQD
15.0000 mL | Freq: Four times a day (QID) | OROMUCOSAL | Status: DC
  Administered 2011-04-08 – 2011-04-19 (×35): 15 mL via OROMUCOSAL

## 2011-04-08 MED ORDER — PANTOPRAZOLE SODIUM 40 MG IV SOLR
40.0000 mg | INTRAVENOUS | Status: DC
  Administered 2011-04-08 – 2011-04-18 (×11): 40 mg via INTRAVENOUS
  Filled 2011-04-08 (×13): qty 40

## 2011-04-08 MED ORDER — CHLORHEXIDINE GLUCONATE 0.12 % MT SOLN
15.0000 mL | Freq: Two times a day (BID) | OROMUCOSAL | Status: DC
  Administered 2011-04-08 – 2011-04-09 (×3): 15 mL via OROMUCOSAL
  Filled 2011-04-08 (×2): qty 15

## 2011-04-08 MED ORDER — CHLORHEXIDINE GLUCONATE 0.12 % MT SOLN: 15.0000 mL | Freq: Two times a day (BID) | OROMUCOSAL | Status: DC

## 2011-04-08 MED ORDER — IPRATROPIUM BROMIDE HFA 17 MCG/ACT IN AERS
6.0000 | INHALATION_SPRAY | Freq: Four times a day (QID) | RESPIRATORY_TRACT | Status: DC
  Administered 2011-04-08 – 2011-04-10 (×9): 6 via RESPIRATORY_TRACT

## 2011-04-08 MED ORDER — VITAMIN K1 10 MG/ML IJ SOLN
10.0000 mg | Freq: Once | INTRAMUSCULAR | Status: AC
  Administered 2011-04-08: 10 mg via SUBCUTANEOUS
  Filled 2011-04-08 (×3): qty 1

## 2011-04-08 NOTE — Telephone Encounter (Signed)
Patient is was having a severe abdominal over the weekend before christmas and her daughter took her to the ER at [facility] and was dx with diverticulitis.  She is in ICU.  Just wanted to let you know.

## 2011-04-08 NOTE — Progress Notes (Signed)
[facility]/Maxie Debose Progress Note    Subjective: The  Patient is now in the 2100 ICU, intubated for respiratory failure of multiple causes including sepsis and chronic COPD.  She is not on any pressors.  She is alert on the ventilator and answers questions well.  Objective: Vital signs in last 24 hours: Temp:  [97.5 F (36.4 C)-99.4 F (37.4 C)] 99.4 F (37.4 C) (12/28 0400) Pulse Rate:  [70-107] 73  (12/28 0700) Resp:  [12-29] 18  (12/28 0700) BP: (81-148)/(30-65) 109/38 mmHg (12/28 0700) SpO2:  [92 %-100 %] 99 % (12/28 0700) Arterial Line BP: (103-181)/(27-56) 137/41 mmHg (12/28 0700) FiO2 (%):  [40 %-60.3 %] 49.9 % (12/28 0700) Weight:  [217 lb 9.5 oz (98.7 kg)-222 lb 10.6 oz (101 kg)] 217 lb 9.5 oz (98.7 kg) (12/28 0454) Last BM Date: 04/01/11  Intake/Output from previous day: 12/27 0701 - 12/28 0700 In: 1313.6 [I.V.:893.6; NG/GT:20; IV Piggyback:400] Out: 1720 [Urine:620; Emesis/NG output:1100] Intake/Output this shift:    General: No acute distress when not manipulated, but has some moderate abdominal pain when palpated.  Lungs: clear to auscultation, CXR shows bilateral atelectasis, L>R, with likely effusion on the left.  Some ASD.  ETT okay.  No evidence of free air on CXR  Abd: Distended, moderately tender, good bowel sounds.  NGT in place and has drained about 900cc of bilious fluid.  Extremities: Chronic venous stasis disease, but not markedly edematous.  Neuro: Responding appropriately and alertly on the ventilator  Lab Results:  @LABLAST2 (wbc:2,hgb:2,hct:2,plt:2) BMET  [facility] 04/08/11 0430 04/07/11 1829  NA 138 136  K 3.9 4.5  CL 106 101  CO2 22 24  GLUCOSE 81 103*  BUN 61* 65*  CREATININE 2.52* 2.78*  CALCIUM 7.5* 8.7   PT/INR  Basename 04/08/11 0430 04/07/11 0515  LABPROT 46.3* 36.7*  INR 4.89* 3.63*   ABG  Basename 04/08/11 0431 04/07/11 2056  PHART 7.305* 7.288*  HCO3 22.9 23.8    Studies/Results: Ct Abdomen Pelvis Wo Contrast  04/06/2011   *RADIOLOGY REPORT*  Clinical Data: Follow-up acute diverticulitis  CT ABDOMEN AND PELVIS WITHOUT CONTRAST  Technique:  Multidetector CT imaging of the abdomen and pelvis was performed following the standard protocol without intravenous contrast.  Comparison: CT abdomen pelvis - 04/03/2011; 07/29/2008  Findings:  The lack of intravenous contrast limits the ability to evaluate solid abdominal organs.  Redemonstrated acute diverticulitis involving the several loops of proximal aspect of the jejunum. There is stranding within the ventral mesentery with an approximately 2.5 x 3.3 cm fluid collection within the deep ventral mesentery (image 32, series five), previously, 1.9 x 2.6 cm. Several adjacent scattered foci of air are favored to be intraluminal.  No definite pneumoperitoneum.  There is mild dilatation of several loops of small bowel adjacent to the area of acute diverticulitis with decompression of the distal small bowel loops.  Previously ingested enteric contrast extends to the level of the colon.  The amount of fluid along the left pericolic gutter has minimally increased interval.  This is associated with interval increase in the amount of subcutaneous edema about the lateral abdominal wall. Interval increase in amount of fluid in the pelvic cul-de-sac. There is extensive diverticular disease involving the colon, in particular the sigmoid colon.  Interval increase in amount of perihepatic fluid.  Cholelithiasis.  The bilateral kidneys remain atrophic.  No evidence of urinary obstruction.  Normal noncontrast appearance of the bilateral adrenal glands and pancreas.  The spleen is noted to be diminutive but otherwise normal.  Scattered calcified atherosclerotic disease within a normal caliber abdominal aorta.  Scattered retroperitoneal lymph nodes are not enlarged by CT criteria.  Redemonstrated bladder diverticuli.  Limited visualization of the lower thorax demonstrates interval development of small bilateral  effusions and adjacent dependent consolidative opacities, left greater than right, possibly atelectasis or infection not excluded.  Cardiomegaly. Calcifications of the mitral valve annulus.  No pericardial effusion.  No acute or aggressive osseous abnormalities.  Stable support vertebral body fusion mild to moderate multilevel thoracic and lumbar spine degenerative change, worse at L3-L4 and L5 - S1.  IMPRESSION: 1. Overall findings suggestive of worsening acute diverticulitis of the proximal jejunum with increase in the amount of fluid within the left paracolic gutter, about liver and extending into the pelvis.  There is a small fluid collection within the deep ventral mesentery which is grossly unchanged in size, measuring 2.5 x 3.3 cm in diameter. Interval increase in amount of subcutaneous edema about the left lateral abdominal wall.  2.  Findings suggestive of early/partial small bowel obstruction about the level of acute diverticulitis.  3.  Interval increase in size of bilateral pleural effusions and adjacent consolidative opacities, left greater than right, possibly atelectasis although underlying infection is not excluded.  4.  Cholelithiasis.  Original Report Authenticated By: Waynard Reeds, M.D.   Dg Chest 2 View  04/06/2011  *RADIOLOGY REPORT*  Clinical Data: Hypoxia, abdominal pain  CHEST - 2 VIEW  Comparison: 04/05/2011  Findings: Cardiomegaly with suspected mild interstitial edema and bilateral lower lobe airspace opacities, likely a combination of atelectasis and pleural effusions.  IMPRESSION: Cardiomegaly with suspected mild interstitial edema and bilateral pleural effusions.  Original Report Authenticated By: Charline Bills, M.D.   Dg Chest Port 1 View  04/07/2011  *RADIOLOGY REPORT*  Clinical Data: Respiratory failure and status post intubation and central line placement.  PORTABLE CHEST - 1 VIEW  Comparison: Film 1515 hours  Findings: Endotracheal tube tip lies approximately 1 cm  above the carina.  Left jugular central lines been placed with the catheter tip transversely oriented in the left innominate vein.  No pneumothorax present.  Persistent dense consolidation of the left lung.  IMPRESSION: Endotracheal tube tip approximately 1 cm above the carina.  Central line tip lies in the left innominate vein.  No pneumothorax. Stable left lung consolidation.  Original Report Authenticated By: Reola Calkins, M.D.   Dg Chest Port 1 View  04/07/2011  *RADIOLOGY REPORT*  Clinical Data: 77 year old female with respiratory failure.  PORTABLE CHEST - 1 VIEW  Comparison: 04/06/2011 earlier.  CT abdomen and pelvis 04/06/2011.  Findings: Portable semi upright AP view 1515 hours.  Continued confluent left mid and lower lung opacity with air bronchograms at the left hilum.  A component of pleural effusion is suspected. Less pronounced veiling opacity at the right lung base is stable. No overt pulmonary edema.  No pneumothorax.  Stable cardiac size and mediastinal contours.  IMPRESSION: 1.  Continued left pleural effusion with left lower lobe consolidation which most resembled pneumonia on the recent CT comparison. 2.  Stable small right pleural effusion.  Original Report Authenticated By: Harley Hallmark, M.D.   Dg Abd Portable 1v  04/07/2011  *RADIOLOGY REPORT*  Clinical Data: Follow up of ileus.  ABDOMEN - 1 VIEW  Comparison: CT of 04/06/2011.  Plain films of 04/05/2011.  Findings: Single supine view of the left side of the abdomen. Moderately degraded by patient's size, positioning, and exclusion of the right side of the abdomen.  Gas filled  bowel loop in the left side of the abdomen is favored to be within the colon.  Gas filled small bowel loops are improved.  Contrast is seen throughout the majority of the colon.  No gross pneumatosis or free intraperitoneal air.  Contrast seen within mildly prominent rectum and sigmoid.  A nasogastric tube terminates at the distal stomach.  IMPRESSION:  Moderate to markedly degraded, nearly nondiagnostic single view of the abdomen.  Probable improvement in adynamic ileus, without gross free intraperitoneal air or pneumatosis.  Original Report Authenticated By: Consuello Bossier, M.D.    Anti-infectives: Anti-infectives     Start     Dose/Rate Route Frequency Ordered Stop   04/07/11 1200   vancomycin (VANCOCIN) IVPB 1000 mg/200 mL premix        1,000 mg 200 mL/hr over 60 Minutes Intravenous Every 24 hours 04/06/11 0941     04/06/11 1200   vancomycin (VANCOCIN) 1,500 mg in sodium chloride 0.9 % 500 mL IVPB        1,500 mg 250 mL/hr over 120 Minutes Intravenous  Once 04/06/11 0941 04/06/11 1343   04/06/11 1200  piperacillin-tazobactam (ZOSYN) IVPB 2.25 g       2.25 g 100 mL/hr over 30 Minutes Intravenous 4 times per day 04/06/11 0941     04/04/11 0000   ciprofloxacin (CIPRO) IVPB 400 mg  Status:  Discontinued        400 mg 200 mL/hr over 60 Minutes Intravenous Every 24 hours 04/03/11 0036 04/06/11 0847   04/03/11 0045   ciprofloxacin (CIPRO) IVPB 400 mg        400 mg 200 mL/hr over 60 Minutes Intravenous NOW 04/03/11 0036 04/03/11 0145   04/02/11 2359   metroNIDAZOLE (FLAGYL) IVPB 500 mg  Status:  Discontinued        500 mg 100 mL/hr over 60 Minutes Intravenous Every 8 hours 04/02/11 2328 04/06/11 0847   04/02/11 2115   cefTRIAXone (ROCEPHIN) 1 g in dextrose 5 % 50 mL IVPB        1 g 100 mL/hr over 30 Minutes Intravenous  Once 04/02/11 2110 04/02/11 2314          Assessment/Plan: s/p  Continue ABX therapy due to likely pulmonary infection and urinary track infection with E.coli form 12/22.  She is on Vanco and Zosyn Continue foley due to Continue foley due to strict I&O, Continue foley due to patient critically ill, Continue foley due to  patient in ICU and Continue foley due to Urinary output monitoring Will need nutrition, and would elect for TNA per pharmacy  INR elevated to almost 5.0.  If she needs volume, then I would  elect to give her some FFP.  If not I would let her INR drift down.  There is no urgent reason for OR at this point as I do not believe that the patient is septic from an abdominal source.  She has had a jejunal perforation before that did not require surgical intervention.  I will review her CT again.  LOS: 6 days   Marta Lamas. Gae Bon, MD, FACS [phone] [facility] Surgery 04/08/2011

## 2011-04-08 NOTE — Consult Note (Signed)
Name: EVONA WESTRA MRN: [id number] DOB: 05-01-1930    LOS: 6  [place] [facility]  History of Present Illness:  77 year old white female with multiple medical co-morbids: COPD w/ chronic resp failure (O2 dependant 2 liters), CRI w/ BL scr of ~2.55, MO, ANCA positive pauci-immune GN, prior ARDS required trach, CAF and prior perforation of jejunum. Presented on 12/22 with ~ 1-1.5 wk h/o abd pain, particularly over RLQ.Dx eval demonstrated: severe diverticulitis of jejunum. She was was admitted to medical service. Rx has included IVFs, and empiric antibiotics. On 12/26 developed progressive hypoxia and antibiotic selection was widened to cover for possible HCAP.  On 7/27 she developed progressive MS change, ABG showed mixed respiratory and metabolic acidosis. She was placed on NIPPV, PCCM asked to eval .     Lines / Drains: L IJ CVC 12/27 >> R radial art line 12/27 >>  Cultures: 12/22 MRSA: positive 12/22 BCX2>>> 12/22 UC: e coli (>100K/pan sens) Lactate 12/27>>> 0.6 -> 0.7 procalcitonin 12/27>>> 3.42 -> 2.26  Antibiotics: cipro 12/22>>>12/26 Flagyl 12/22>>>12/26 Zosyn (sepsis, ? HCAP) 12/26>>> vanc (sepsis ? HCAP) 12/26>>>  Tests / Events: CT abd 12/26: 1. Overall findings suggestive of worsening acute diverticulitis of  the proximal jejunum with increase in the amount of fluid within the left paracolic gutter, about liver and extending into the  pelvis. There is a small fluid collection within the deep ventral  mesentery which is grossly unchanged in size, measuring 2.5 x 3.3  cm in diameter. Interval increase in amount of subcutaneous edema  about the left lateral abdominal wall. 2. Findings suggestive of early/partial small bowel obstruction about the level of acute diverticulitis.  3. Interval increase in size of bilateral pleural effusions and adjacent consolidative opacities, left greater than right, possibly atelectasis although underlying infection is not  excluded. 4. Cholelithiasis.   HPI 77 year old white female with multiple medical co-morbids: COPD w/ chronic resp failure (O2 dependant 2 liters), CRI w/ BL scr of ~2.55, MO, ANCA positive pauci-immune GN, prior ARDS required trach, CAF and prior perforation of jejunum. Presented on 12/22 with ~ 1-1.5 wk h/o abd pain, particularly over RLQ.Dx eval demonstrated: severe diverticulitis of jejunum. She was was admitted to medical service. Rx has included IVFs, and empiric antibiotics. On 12/26 developed progressive hypoxia and antibiotic selection was widened to cover for possible HCAP.  On 7/27 she developed progressive MS change, ABG showed mixed respiratory and metabolic acidosis. She was placed on NIPPV, PCCM asked to eval .   Vital Signs: Temp:  [97.5 F (36.4 C)-99.4 F (37.4 C)] 98.1 F (36.7 C) (12/28 1200) Pulse Rate:  [70-101] 78  (12/28 1300) Resp:  [14-29] 18  (12/28 1300) BP: (81-148)/(30-65) 110/42 mmHg (12/28 1300) SpO2:  [93 %-100 %] 98 % (12/28 1300) Arterial Line BP: (103-181)/(27-56) 138/37 mmHg (12/28 1300) FiO2 (%):  [39.9 %-60.3 %] 39.9 % (12/28 1300) Weight:  [98.7 kg (217 lb 9.5 oz)-101 kg (222 lb 10.6 oz)] 217 lb 9.5 oz (98.7 kg) (12/28 0454) CVP:  [9 mmHg-307 mmHg] 10 mmHg    . sodium chloride 50 mL/hr (04/07/11 0950)  . propofol 5 mcg/kg/min (04/08/11 0550)   I/O last 3 completed shifts: In: 1413.6 [I.V.:893.6; NG/GT:20; IV Piggyback:500] Out: 1920 [Urine:820; Emesis/NG output:1100]  Physical Examination: General:  Chronically ill appearing white female, lethargic w/ asterixis when asked to hold out hands.   Neuro:  Speech slurred. Oriented X3, somnolent, but arousable  HEENT:  Mucous membranes are dry, No JVD Neck:  +  neck vein distention Cardiovascular:  Regular irregular, AF on tele Lungs: diffuse rhonchi, decreased in bases Abdomen:  Distended, firm to palp. Marked rebound tenderness to RLQ Musculoskeletal:  No sig edema, brisk CR Skin:  Chronic venous  stasis changes.   Ventilator settings: Vent Mode:  [-] PRVC FiO2 (%):  [39.9 %-60.3 %] 39.9 % Set Rate:  [14 bmp-18 bmp] 18 bmp Vt Set:  [440 mL] 440 mL PEEP:  [5 cmH20-5.8 cmH20] 5 cmH20 Plateau Pressure:  [19 cmH20-27 cmH20] 27 cmH20  Labs and Imaging:   Lab 04/08/11 0430 04/07/11 1829 04/07/11 0515  NA 138 136 137  K 3.9 4.5 5.3*  CL 106 101 99  CO2 22 24 23   BUN 61* 65* 65*  CREATININE 2.52* 2.78* 2.80*  GLUCOSE 81 103* 173*    Lab 04/08/11 0430 04/07/11 0515 04/06/11 0615  HGB 8.3* 9.9* 9.1*  HCT 28.5* 35.3* 31.9*  WBC 18.3* 22.8* 14.8*  PLT 309 453* 323   Lab Results  Component Value Date   INR 4.89* 04/08/2011   INR 3.63* 04/07/2011   INR 2.58* 04/06/2011   ABG    Component Value Date/Time   PHART 7.305* 04/08/2011 0431   PCO2ART 46.3* 04/08/2011 0431   PO2ART 105.0* 04/08/2011 0431   HCO3 22.9 04/08/2011 0431   TCO2 24 04/08/2011 0431   ACIDBASEDEF 3.0* 04/08/2011 0431   O2SAT 97.0 04/08/2011 0431   PCXR: CM, bibasilar volume loss w/ marked ATX and some degree of effusion  Assessment and Plan:  Diverticulitis of jejunum with resultant SIRS/sepsis: this has gotten worse since admit and now complicated by ileus. Surgery following. CT does show small fluid collection, CCS following on medical rx, consider repeat CT if she clinically changes  Lab 04/08/11 0430 04/07/11 0515 04/06/11 0615  WBC 18.3* 22.8* 14.8*    Lab 04/08/11 0430 04/07/11 0915  PROCALCITON 2.26 3.42  plan: -continue current abx -place NGT for decompression  Acute-on-chronic respiratory failure in setting of  COPD now acutely decompensating in the setting of hypoventilation d/t obesity, abd pain and Atelectasis: failed NIPPV.  Plan: -continue MV until underlying abd process stabilizes -scheduled BDs  Atrial fibrillation,   Plan: -rate control -will stop coumadin -Vit K x 1 12/28  Coumadin induced coagulopathy Lab Results  Component Value Date   INR 4.89* 04/08/2011    INR 3.63* 04/07/2011   INR 2.58* 04/06/2011  plan: - Vit K as above  DIASTOLIC HEART FAILURE, CHRONIC Plan: -BP control  Acute on chronic renal failure: baseline ~2.55-2.6  Recent Labs  Basename 04/08/11 0430 04/07/11 1829 04/07/11 0515   CREATININE 2.52* 2.78* 2.80*  plan -avoid nephrotoxins -follow up chemistry  DIABETES MELLITUS, TYPE II Plan: -ssi  Encephalopathy/ transient and multifactorial d/t hypercarbia and sepsis.  Plan: -supportive care   Hyperkalemia: suspect acidosis contributing.   Lab 04/08/11 0430 04/07/11 1829 04/07/11 0515  K 3.9 4.5 5.3*  plan: -f/u bmp am   Best practices / Disposition: -->ICU status under PCCM -->full code -->Heparin for AF after INR <2.  -->Protonix for GI Px -->ventilator bundle -->diet: NPO, may need TNA.  -->family updated at bedside -->code status: full code. Does not want trach.    Levy Pupa, MD, PhD 04/08/2011, 2:13 PM [facility] [phone] or if no answer [phone]

## 2011-04-08 NOTE — Consult Note (Signed)
I have seen the patient and currently she does not require emergency surgery, although this may change  [name]. Gae Bon, MD, FACS [phone] [facility] Surgery

## 2011-04-08 NOTE — Progress Notes (Signed)
INITIAL ADULT NUTRITION ASSESSMENT Date: 04/08/2011   Time: 2:20 PM  Reason for Assessment: VDRF  ASSESSMENT: Female 77 y.o.  Dx: Diverticulitis large intestine  Hx:  Past Medical History  Diagnosis Date  . Diabetes mellitus   . Obesity   . COPD (chronic obstructive pulmonary disease)     Moderate. PFTs (12/10): FVC 67%, FEV1 58%, ratio 60%, TLC 95%, RV 138%, DLCO 43% (moderate obstructive defect). She is on home oxygen that should be worn at all times.  . Diastolic CHF, chronic     Echo (3/12): EF 55-60%, mild LV hypertrophy, grade I diastolic dysfunction, mild left atrial enlargement, normal pulmonary artery pressure  . CKD (chronic kidney disease)     Cr 2.4 when last checked. 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 PNA (pneumonia)     h/o with ARDS in 2005; MRSA PNA with prolonged [facility] 11/11  . Diverticulum     Jejunal  . OSA (obstructive sleep apnea)     possible. When pt was sedated for TEE in May 2010, she did develop some upper airway obstruction  . Anemia     of chronic disease  . Hypothyroidism   . Gout   . Atrial fibrillation 07/2008    Failed cardioversion with TEE guidance in May 2010. Pt went back to NSR by 11/2008 with amiodarne  . Hypertension   . GERD (gastroesophageal reflux disease)   . Peripheral vascular disease   . Pneumonia   . Chronic kidney disease    Related Meds:     . albuterol  6 puff Inhalation QID  . antiseptic oral rinse  15 mL Mouth Rinse QID  . chlorhexidine  15 mL Mouth/Throat BID  . etomidate  10 mg Intravenous Once  . fentaNYL  100 mcg Intravenous Once  . fluticasone  2 puff Inhalation BID  . insulin aspart  0-9 Units Subcutaneous Q4H  . ipratropium  2 puff Inhalation Q4H  . midazolam      . midazolam  2 mg Intravenous Once  . mupirocin ointment  1 application Nasal BID  . piperacillin-tazobactam (ZOSYN)  IV  2.25 g Intravenous Q6H  . rocuronium  0.6 mg/kg Intravenous  Once  . vancomycin  1,000 mg Intravenous Q24H  . DISCONTD: albuterol  2.5 mg Nebulization QID  . DISCONTD: budesonide  0.25 mg Nebulization BID  . DISCONTD: chlorhexidine  15 mL Mouth/Throat QID  . DISCONTD: chlorhexidine  15 mL Mouth/Throat BID  . DISCONTD: ipratropium  0.5 mg Nebulization QID   Ht: 5\' 4"  (162.6 cm)  Wt: 217 lb 9.5 oz (98.7 kg)  Ideal Wt: 54.5 kg  % Ideal Wt: 181%  Usual Wt: unable to obtain % Usual Wt: n/a  Body mass index is 37.35 kg/(m^2). Pt is obese.  Food/Nutrition Related Hx: unable to obtain at this time  Labs:  CMP     Component Value Date/Time   NA 138 04/08/2011 0430   K 3.9 04/08/2011 0430   CL 106 04/08/2011 0430   CO2 22 04/08/2011 0430   GLUCOSE 81 04/08/2011 0430   BUN 61* 04/08/2011 0430   CREATININE 2.52* 04/08/2011 0430   CREATININE 2.65* 06/18/2010 1715   CALCIUM 7.5* 04/08/2011 0430   PROT 4.3* 04/08/2011 0430   ALBUMIN 1.6* 04/08/2011 0430   AST 25 04/08/2011 0430   ALT 12 04/08/2011 0430   ALKPHOS 44 04/08/2011 0430   BILITOT 0.2* 04/08/2011 0430   GFRNONAA 17* 04/08/2011 0430   GFRAA  20* 04/08/2011 0430   Intake/Output: I/O last 3 completed shifts: In: 1413.6 [I.V.:893.6; NG/GT:20; IV Piggyback:500] Out: 1920 [Urine:820; Emesis/NG output:1100] Total I/O In: 490.5 [I.V.:278; IV Piggyback:212.5] Out: 525 [Urine:375; Emesis/NG output:150]   Diet Order: NPO  Supplements/Tube Feeding: none  IVF:    sodium chloride Last Rate: 50 mL/hr (04/07/11 0950)  propofol Last Rate: 5 mcg/kg/min (04/08/11 0550)    Estimated Nutritional Needs:   Kcal:  1696 kcal   Permissive underfeeding kcal goal (60-70%) per APSEN guidelines: 1017 - 1187 kcal Protein: 75 - 85 g Fluid:  1.6 - 1.8 L/d  Pt admitted with diverticulitis. Intubated 12/27. Hx of worsening CKD. Propofol currently at 2.9 ml/hr, provides 77 kcal/d.  NUTRITION DIAGNOSIS: -Inadequate oral intake (NI-2.1).  Status: Ongoing  RELATED TO: inability to eat  AS EVIDENCE  BY: NPO status  MONITORING/EVALUATION(Goals): Goal: Initiate nutrition support within 24-48 hours if unable to extubate. Enteral nutrition to provide 60-70% of estimated calorie needs (22-25 kcals/kg ideal body weight) and 100% of estimated protein needs, based on ASPEN guidelines for permissive underfeeding in critically ill obese individuals. Monitor: extubation, weights, labs, I/O's, nutrition support initiation  EDUCATION NEEDS: -No education needs identified at this time  INTERVENTION: 1. If EN warranted, recommend initiation of Vital 1.2 via enteral feeding tube at 10 ml/hr, advance by 10 ml/hr every 4 hours or as tolerated to goal of 30 ml/hr. 30 ml Prostat liquid protein via tube BID. TF regimen + current propofol will provide: 1085 kcal, 84 g protein, 584 ml free water. 2. Once propofol discontinued, recommend Vital 1.2 at 35 ml/hr with 30 ml Prostat liquid protein via tube daily. This TF regimen will provide: 1080 kcal, 78 g protein, 681 ml free water. 3. RD to follow nutrition care plan.  Dietitian #: [phone]  DOCUMENTATION CODES Per approved criteria  -Obesity Unspecified    Adair Laundry 04/08/2011, 2:20 PM

## 2011-04-08 NOTE — Progress Notes (Signed)
UR Completed.  [name] Jane [phone] 04/08/2011  

## 2011-04-08 NOTE — Progress Notes (Signed)
Pharmacist System-Based Medication Review: Anticoagulation - Afib: Coumadin PTA, INR up to 4.89 s/p 3 days flagyl/cipro -(Home Coumadin dose 2mg  MWFSS 1mg  TT) .  Heparin when INR <2  Infectious Disease - Hx of jejunal perforation. CT remarkable for diffuse diverticulitis of the jejunum. Vanc/zosyn for #2 for R/O HCAP, stopped flagyl/cipro; 12/22 urine > 100 K e coli. Wbc down to 18.3. CCS doesn't think abd is the source. PCT = 2.26  Cardiovascular - VSS; Hx Diastolic heart failure/HTN/afib/HL: on home simvastatin, fenofibrate, home BP meds currently on hold d/t bradycardia and soft pressures.  Endocrinology - DM2: No home meds meds for DM2, CBGs <117-165, stable on sensitive SSI. Hypothyroid: TSH 3.64 01/04/2011, no home medication listed. Gout: No home meds or symptomatic complaints.  Gastrointestinal / Nutrition - Diverticulitis. May need repeat CT if worsen. No emergent OR needed per CCS  Neurology -  Propofol Nephrology - CRI: Scr down to 2.52 (from 3.25) CrCl ~35ml/min, baseline is 2.4 (per MD note). I/O -459  Pulmonary - COPD: On home advair and spiriva. Came in finishing prednisone taper (?COPD exacerbation). 98% vent FiO2 40%  Hematology / Oncology - Anemia. H/H trending down, pltc wnl  PTA Medication Issues -f/u resuming home BP meds when able  Best Practices - VTE prophylaxis: Coumadin (Home Coumadin dose 2mg  MWFSS 1mg  TT) Plan:  1. Day 3 Vancomycin 1000mg  IV q24 hrs  2. Day 3 Zosyn 2.25 gm IV q6h 3. Hold coumadin 4. Heparin when INR <2

## 2011-04-08 NOTE — Progress Notes (Signed)
Physical Therapy Note: P.T. Was following pt who is not intubated and P.T. Order was discontinued. Signing off and await reorder when pt appropriate for therapy. Thanks Toney Sang, PT [phone]

## 2011-04-09 ENCOUNTER — Inpatient Hospital Stay (HOSPITAL_COMMUNITY): Payer: Medicare Other

## 2011-04-09 LAB — CBC
HCT: 30.2 % — ABNORMAL LOW (ref 36.0–46.0)
Hemoglobin: 9 g/dL — ABNORMAL LOW (ref 12.0–15.0)
MCV: 89.9 fL (ref 78.0–100.0)
RBC: 3.36 MIL/uL — ABNORMAL LOW (ref 3.87–5.11)
RDW: 17.1 % — ABNORMAL HIGH (ref 11.5–15.5)
WBC: 22.1 10*3/uL — ABNORMAL HIGH (ref 4.0–10.5)

## 2011-04-09 LAB — POCT I-STAT 3, ART BLOOD GAS (G3+)
pCO2 arterial: 43.3 mmHg (ref 35.0–45.0)
pH, Arterial: 7.375 (ref 7.350–7.400)

## 2011-04-09 LAB — GLUCOSE, CAPILLARY
Glucose-Capillary: 110 mg/dL — ABNORMAL HIGH (ref 70–99)
Glucose-Capillary: 84 mg/dL (ref 70–99)
Glucose-Capillary: 89 mg/dL (ref 70–99)
Glucose-Capillary: 94 mg/dL (ref 70–99)
Glucose-Capillary: 97 mg/dL (ref 70–99)

## 2011-04-09 LAB — BASIC METABOLIC PANEL
BUN: 56 mg/dL — ABNORMAL HIGH (ref 6–23)
CO2: 23 mEq/L (ref 19–32)
GFR calc non Af Amer: 20 mL/min — ABNORMAL LOW (ref >90)
Glucose, Bld: 71 mg/dL (ref 70–99)
Potassium: 4.8 mEq/L (ref 3.5–5.1)
Sodium: 140 mEq/L (ref 135–145)

## 2011-04-09 LAB — CULTURE, BLOOD (ROUTINE X 2)
Culture  Setup Time: 201212230139
Culture  Setup Time: 201212230139
Culture: NO GROWTH

## 2011-04-09 LAB — PHOSPHORUS: Phosphorus: 3.2 mg/dL (ref 2.3–4.6)

## 2011-04-09 MED ORDER — FUROSEMIDE 10 MG/ML IJ SOLN
40.0000 mg | Freq: Three times a day (TID) | INTRAMUSCULAR | Status: AC
  Administered 2011-04-09 – 2011-04-10 (×2): 40 mg via INTRAVENOUS
  Filled 2011-04-09: qty 4

## 2011-04-09 MED ORDER — SODIUM CHLORIDE 0.9 % IJ SOLN
INTRAMUSCULAR | Status: AC
  Administered 2011-04-09: 20 mL
  Filled 2011-04-09: qty 20

## 2011-04-09 MED ORDER — CLINIMIX/DEXTROSE (5/15) 5 % IV SOLN
INTRAVENOUS | Status: AC
  Filled 2011-04-09: qty 1000

## 2011-04-09 MED ORDER — FUROSEMIDE 10 MG/ML IJ SOLN
INTRAMUSCULAR | Status: AC
  Filled 2011-04-09: qty 4

## 2011-04-09 MED ORDER — VITAMIN K1 10 MG/ML IJ SOLN
10.0000 mg | Freq: Once | INTRAMUSCULAR | Status: AC
  Administered 2011-04-09: 10 mg via SUBCUTANEOUS
  Filled 2011-04-09 (×2): qty 1

## 2011-04-09 NOTE — Progress Notes (Signed)
PARENTERAL NUTRITION CONSULT NOTE - INITIAL  Pharmacy Consult for TPN Indication: Ileus  Allergies  Allergen Reactions  . Sulfonamide Derivatives     Childhood reaction    Patient Measurements: Height: 5\' 4"  (162.6 cm) Weight: 221 lb 12.5 oz (100.6 kg) IBW/kg (Calculated) : 54.7  Adjusted Body Weight: 68.5kg  Vital Signs: Temp: 97.8 F (36.6 C) (12/29 1100) Temp src: Oral (12/29 1100) BP: 108/41 mmHg (12/29 1000) Pulse Rate: 80  (12/29 1000) Intake/Output from previous day: 12/28 0701 - 12/29 0700 In: 1529 [I.V.:1119; IV Piggyback:410] Out: 1965 [Urine:1415; Emesis/NG output:550] Intake/Output from this shift: Total I/O In: 100 [I.V.:100] Out: 200 [Urine:200]  Labs:  [facility] 04/09/11 0500 04/08/11 0430 04/07/11 0515  WBC 22.1* 18.3* 22.8*  HGB 9.0* 8.3* 9.9*  HCT 30.2* 28.5* 35.3*  PLT 324 309 453*  APTT -- -- --  INR 3.91* 4.89* 3.63*     Basename 04/09/11 0500 04/08/11 0430 04/07/11 1829 04/07/11 0515  NA 140 138 136 --  K 4.8 3.9 4.5 --  CL 105 106 101 --  CO2 23 22 24  --  GLUCOSE 71 81 103* --  BUN 56* 61* 65* --  CREATININE 2.19* 2.52* 2.78* --  LABCREA -- -- -- --  CREAT24HRUR -- -- -- --  CALCIUM 8.2* 7.5* 8.7 --  MG 2.3 -- -- --  PHOS 3.2 -- -- --  PROT -- 4.3* -- 6.1  ALBUMIN -- 1.6* -- 2.2*  AST -- 25 -- 12  ALT -- 12 -- 14  ALKPHOS -- 44 -- 77  BILITOT -- 0.2* -- 0.2*  BILIDIR -- -- -- --  IBILI -- -- -- --  PREALBUMIN -- -- -- --  TRIG -- -- -- --  CHOLHDL -- -- -- --  CHOL -- -- -- --   Estimated Creatinine Clearance: 24.4 ml/min (by C-G formula based on Cr of 2.19).    Basename 04/09/11 0749 04/09/11 0415 04/09/11 0005  GLUCAP 96 89 84    Medical History: Past Medical History  Diagnosis Date  . Diabetes mellitus   . Obesity   . COPD (chronic obstructive pulmonary disease)     Moderate. PFTs (12/10): FVC 67%, FEV1 58%, ratio 60%, TLC 95%, RV 138%, DLCO 43% (moderate obstructive defect). She is on home oxygen that should  be worn at all times.  . Diastolic CHF, chronic     Echo (3/12): EF 55-60%, mild LV hypertrophy, grade I diastolic dysfunction, mild left atrial enlargement, normal pulmonary artery pressure  . CKD (chronic kidney disease)     Cr 2.4 when last checked. 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 PNA (pneumonia)     h/o with ARDS in 2005; MRSA PNA with prolonged hospitalization in [facility] regional 11/11  . Diverticulum     Jejunal  . OSA (obstructive sleep apnea)     possible. When pt was sedated for TEE in May 2010, she did develop some upper airway obstruction  . Anemia     of chronic disease  . Hypothyroidism   . Gout   . Atrial fibrillation 07/2008    Failed cardioversion with TEE guidance in May 2010. Pt went back to NSR by 11/2008 with amiodarne  . Hypertension   . GERD (gastroesophageal reflux disease)   . Peripheral vascular disease   . Pneumonia   . Chronic kidney disease     Medications:  Scheduled:    . albuterol  6 puff Inhalation QID  . antiseptic oral rinse  15 mL Mouth Rinse QID  . fluticasone  2 puff Inhalation BID  . furosemide  40 mg Intravenous Q8H  . insulin aspart  0-9 Units Subcutaneous Q4H  . ipratropium  6 puff Inhalation QID  . pantoprazole (PROTONIX) IV  40 mg Intravenous Q24H  . phytonadione  10 mg Subcutaneous Once  . phytonadione  10 mg Subcutaneous Once  . piperacillin-tazobactam (ZOSYN)  IV  2.25 g Intravenous Q6H  . sodium chloride      . vancomycin  1,000 mg Intravenous Q24H  . DISCONTD: albuterol  2.5 mg Nebulization QID  . DISCONTD: budesonide  0.25 mg Nebulization BID  . DISCONTD: chlorhexidine  15 mL Mouth/Throat BID  . DISCONTD: furosemide      . DISCONTD: ipratropium  2 puff Inhalation Q4H  . DISCONTD: ipratropium  0.5 mg Nebulization QID   Infusions:    . sodium chloride 50 mL/hr at 04/08/11 1835  . DISCONTD: propofol 3 mcg/kg/min (04/09/11 0900)     Insulin Requirements in the past 24 hours:  None,  CBGs currently <100  Current Nutrition:  None 0.9% sodium chloride infusing at 50 ml/hr Propofol discontinued this morning for extubation  Assessment: Diverticulitis with abscess and ileus: to begin nutritional support with TPN.  Note permissive underfeeding kcal goals per ASPEN guidelines and RD recommendations.  She also has chronic renal insufficiency and her electrolytes are currently within normal limits without supplementation, so I will select our electrolyte-free formula for her initial TPN. Please note: Lipids, MVI, and Trace Elements will be provided on MWF only due to shortages.  These shortages, combined with use of premix TPN, will limit our ability to meet protein goals.  Nutritional Goals:  1017-1187 kCal, 75-85 grams of protein per day  Plan:  1. Start Clinimix 5/15 (without electrolytes) at 73ml/hr.  This will provide 682kCal/day and 48gm protein/day.   2. Advance as tolerated to a goal rate of 76ml/hr.  This will provide a weekly average of 1151kcal/day and 66gm of protein/day. 3. Monitor electrolytes closely given history of CRI and potential for refeeding. 4. Monitor CBGs and insulin requirements.  No adjustments currently indicated to her existing orders. 5. Decrease IVF to [facility] when TPN starts.  Estella Husk, Pharm.D., BCPS Clinical Pharmacist  Pager [phone] 04/09/2011, 11:42 AM

## 2011-04-09 NOTE — Progress Notes (Signed)
ANTICOAGULATION CONSULT NOTE - Follow Up Consult  Pharmacy Consult for Heparin when INR <2 Indication: atrial fibrillation  Allergies  Allergen Reactions  . Sulfonamide Derivatives     Childhood reaction    Pharmacist System-Based Medication Review: Anticoagulation - Afib: Coumadin PTA, INR down to 3.24 this afternoon after more vitamin K 10 mg SQ,-(Home Coumadin dose 2mg  MWFSS 1mg  TT) .  Asses INR in am and start Heparin when INR <2 Infectious Disease - R/O HCAP d#4 Vanc/zosyn, History of  perforation. CT remarkable for diffuse diverticulitis of the jejunum.  stopped flagyl/cipro; 12/22 urine > 100 K e coli.  Bld cxs 12/22 Final Neg, Blood 12/27 NGTD, Wbc up to 22 today.  Cardiovascular  Hx Diastolic heart failure/HTN/afib/HL: Lasix 40 mg IV q8h; PTA simvastatin, fenofibrate, home BP meds currently on hold d/t bradycardia and low pressures SBP 108-150 Endocrinology - DM2: No home meds meds for DM2, stable on sensitive SSI. Hypothyroid: TSH 3.64 01/04/2011, no home medication listed. Gout: No home meds or symptomatic complaints. Gastrointestinal / Nutrition - Diverticulitis. May need repeat CT if worsen. No emergent OR needed per CCS, But NPO and started TPN today Neurology - Propofol  Nephrology - CRI: CrCl ~67ml/min, baseline SCr is 2.4 (per MD note).  Pulmonary - COPD: Extubated this am, On home advair and spiriva. Came in finishing prednisone taper (?COPD exacerbation). Hematology / Oncology - Anemia. H/H trending upward now, pltc wnl PTA Medication Issues -f/u resuming home BP meds when able Best Practices - VTE prophylaxis: Coumadin (Home Coumadin dose 2mg  MWFSS 1mg  TT); IV protonix  Medications:  Prescriptions prior to admission  Medication Sig Dispense Refill  . albuterol (PROVENTIL) (2.5 MG/3ML) 0.083% nebulizer solution Take 3 mLs (2.5 mg total) by nebulization every 6 (six) hours as needed for wheezing.  75 mL  12  . albuterol (VENTOLIN HFA) 108 (90 BASE) MCG/ACT inhaler Inhale  2 puffs into the lungs every 4 (four) hours as needed for wheezing.  1 Inhaler  5  . amLODipine (NORVASC) 10 MG tablet Take 10 mg by mouth daily.        . fenofibrate (TRICOR) 48 MG tablet Take 48 mg by mouth daily.       . magnesium oxide (MAG-OX) 400 MG tablet Take 400 mg by mouth 3 (three) times daily.        . metoprolol tartrate (LOPRESSOR) 25 MG tablet Take 25 mg by mouth 2 (two) times daily.        . Mometasone Furo-Formoterol Fum (DULERA) 200-5 MCG/ACT AERO Inhale 2 puffs into the lungs 2 (two) times daily.  1 Inhaler  0  . ranitidine (ZANTAC) 150 MG capsule Take 150 mg by mouth every evening.        . simvastatin (ZOCOR) 20 MG tablet Take 1 tablet (20 mg total) by mouth at bedtime.  90 tablet  3  . tiotropium (SPIRIVA) 18 MCG inhalation capsule Place 1 capsule (18 mcg total) into inhaler and inhale daily.  30 capsule  11  . triamcinolone (KENALOG) 0.1 % cream Apply topically 2 (two) times daily.  454 g  1  . warfarin (COUMADIN) 2 MG tablet Take 2 mg by mouth daily. As directed: need to verify dose       . furosemide (LASIX) 40 MG tablet Take 40 mg by mouth 2 (two) times daily.        . NON FORMULARY OXYGEN. 2 liters at bedtime and as needed during the day.       Marland Kitchen  predniSONE (DELTASONE) 20 MG tablet Take 40 mg by mouth daily. Leftover from previous course       . warfarin (COUMADIN) 2 MG tablet TAKE ONE TABLET BY MOUTH EVERY DAY AS DIRECTED  30 tablet  5    Assessment: [name] with Afib and ongoing supertherapeutic INR after course of metronidazole,  INR remains >3 despite vitamin K, will follow up INR in am and start heparin when INR <2.  Goal of Therapy:  Heparin level 0.3-0.7 units/ml   Plan:  1. No warfarin today 2. Follow up INR in am.  Patient Measurements: Height: 5\' 4"  (162.6 cm) Weight: 221 lb 12.5 oz (100.6 kg) IBW/kg (Calculated) : 54.7   Vital Signs: Temp: 97.8 F (36.6 C) (12/29 1100) Temp src: Oral (12/29 1100) BP: 148/46 mmHg (12/29 1120) Pulse Rate: 81   (12/29 1120)  Labs:  Basename 04/09/11 1130 04/09/11 0500 04/08/11 0430 04/07/11 1829 04/07/11 0515  HGB -- 9.0* 8.3* -- --  HCT -- 30.2* 28.5* -- 35.3*  PLT -- 324 309 -- 453*  APTT -- -- -- -- --  LABPROT 33.6* 38.9* 46.3* -- --  INR 3.24* 3.91* 4.89* -- --  HEPARINUNFRC -- -- -- -- --  CREATININE -- 2.19* 2.52* 2.78* --  CKTOTAL -- -- -- -- --  CKMB -- -- -- -- --  TROPONINI -- -- -- -- --   Estimated Creatinine Clearance: 24.4 ml/min (by C-G formula based on Cr of 2.19).  [name] [name] 04/09/2011,3:03 PM

## 2011-04-09 NOTE — Progress Notes (Signed)
Name: [name] MRN: [id number] DOB: [birth year]    LOS: 7  [place] [facility]/Critical Care  History of Present Illness:  77 year old white female with multiple medical co-morbids: COPD w/ chronic resp failure (O2 dependant 2 liters), CRI w/ BL scr of ~2.55, MO, ANCA positive pauci-immune GN, prior ARDS required trach, CAF and prior perforation of jejunum. Presented on 12/22 with ~ 1-1.5 wk h/o abd pain, particularly over RLQ.Dx eval demonstrated: severe diverticulitis of jejunum. She was admitted to medical service. Rx has included IVFs, and empiric antibiotics. On 12/26 developed progressive hypoxia and antibiotic selection was widened to cover for possible HCAP.  On 7/27 she developed progressive MS change, ABG showed mixed respiratory and metabolic acidosis. She was placed on NIPPV, PCCM asked to eval.   Lines / Drains: L IJ CVC 12/27 >> R radial art line 12/27 >> oett 12/27>>>12/29  Cultures: 12/22 MRSA: positive 12/22 BCX2>>> 12/22 UC: e coli (>100K/pan sens) Lactate 12/27>>> 0.6 -> 0.7 Procalcitonin 12/27>>> 3.42 -> 2.26  Antibiotics: Cipro 12/22>>>12/26 Flagyl 12/22>>>12/26 Zosyn (sepsis, ? HCAP) 12/26>>> Vanc (sepsis ? HCAP) 12/26>>>  Tests / Events: CT abd 12/26: 1. Overall findings suggestive of worsening acute diverticulitis of  the proximal jejunum with increase in the amount of fluid within the left paracolic gutter, about liver and extending into the  pelvis. There is a small fluid collection within the deep ventral  mesentery which is grossly unchanged in size, measuring 2.5 x 3.3  cm in diameter. Interval increase in amount of subcutaneous edema  about the left lateral abdominal wall. 2. Findings suggestive of early/partial small bowel obstruction about the level of acute diverticulitis.  3. Interval increase in size of bilateral pleural effusions and adjacent consolidative opacities, left greater than right, possibly atelectasis although underlying infection  is not excluded. 4. Cholelithiasis.   Subjective No distress. Very comfortable on SBT.  Vital Signs: Temp:  [97.7 F (36.5 C)-99.5 F (37.5 C)] 97.7 F (36.5 C) (12/29 0810) Pulse Rate:  [76-101] 77  (12/29 0820) Resp:  [16-32] 18  (12/29 0820) BP: (94-159)/(34-54) 150/42 mmHg (12/29 0820) SpO2:  [95 %-99 %] 99 % (12/29 0820) Arterial Line BP: (104-158)/(33-53) 146/39 mmHg (12/29 0800) FiO2 (%):  [39.6 %-40.4 %] 40.4 % (12/29 0820) Weight:  [100.6 kg (221 lb 12.5 oz)] 221 lb 12.5 oz (100.6 kg) (12/29 0500) CVP:  7-9    . sodium chloride 50 mL/hr at 04/08/11 1835  . propofol 3 mcg/kg/min (04/09/11 0651)   I/O last 3 completed shifts: In: 2242.6 [I.V.:1662.6; NG/GT:20; IV Piggyback:560] Out: 2785 [Urine:2035; Emesis/NG output:750]  Physical Examination: General:  Chronically ill appearing white female, awake, oriented and appropriate on vent  Neuro:  Baseline, has some generalized weakness HEENT:  Mucous membranes are dry, No JVD, orally intubated Cardiovascular:  Regular irregular, AF on tele Lungs: scattered rhonchi, decreased in bases.  Abdomen:  large, soft to palp. Tenderness improved.  Musculoskeletal:  No sig edema, brisk CR Skin:  Chronic venous stasis changes.   Ventilator settings: Vent Mode:  [-] CPAP FiO2 (%):  [39.6 %-40.4 %] 40.4 % Set Rate:  [18 bmp] 18 bmp Vt Set:  [440 mL] 440 mL PEEP:  [5 cmH20-5.3 cmH20] 5 cmH20 Pressure Support:  [5 cmH20-10 cmH20] 5 cmH20 Plateau Pressure:  [15 cmH20-20 cmH20] 16 cmH20  Labs and Imaging:   Lab 04/09/11 0500 04/08/11 0430 04/07/11 1829  NA 140 138 136  K 4.8 3.9 4.5  CL 105 106 101  CO2 23 22 24  BUN 56* 61* 65*  CREATININE 2.19* 2.52* 2.78*  GLUCOSE 71 81 103*    Lab 04/09/11 0500 04/08/11 0430 04/07/11 0515  HGB 9.0* 8.3* 9.9*  HCT 30.2* 28.5* 35.3*  WBC 22.1* 18.3* 22.8*  PLT 324 309 453*   Lab Results  Component Value Date   INR 3.91* 04/09/2011   INR 4.89* 04/08/2011   INR 3.63* 04/07/2011    ABG    Component Value Date/Time   PHART 7.305* 04/08/2011 0431   PCO2ART 46.3* 04/08/2011 0431   PO2ART 105.0* 04/08/2011 0431   HCO3 22.9 04/08/2011 0431   TCO2 24 04/08/2011 0431   ACIDBASEDEF 3.0* 04/08/2011 0431   O2SAT 97.0 04/08/2011 0431   PCXR: CM, slight increase in bibasilar volume loss R>L, w/ marked ATX and some degree of effusion,   Assessment and Plan:  Diverticulitis of jejunum with resultant SIRS/sepsis:  complicated by ileus. Surgery following. CT did show small fluid collection, CCS following on medical rx, consider repeat CT if she clinically changes, otherwise she will need CT scan on Monday.   Lab 04/09/11 0500 04/08/11 0430 04/07/11 0515  WBC 22.1* 18.3* 22.8*    Lab 04/08/11 0430 04/07/11 0915  PROCALCITON 2.26 3.42  plan: -continue current abx/surgery rec cont conservative rx.  -placed NGT for decompression -TNA for bowel rest  Acute-on-chronic respiratory failure in setting of  COPD now acutely decompensating in the setting of hypoventilation d/t obesity, abd pain and Atelectasis: CXR showing persistent volume loss and some degree of pleural effusion. She does have a history of underlying diastolic dysfunction so there may be also some degree of volume excess complicating this picture. This being said her vent mechanics are excellent and she has passed SBT.  Plan: -extubated wean FIO2 and initiate early activity -scheduled BDs -will try diuresis as BP, and creatine allow  Atrial fibrillation,   Plan: -rate control -holding coumadin, heparin when INR drifts below 2  Coumadin induced coagulopathy Lab Results  Component Value Date   INR 3.91* 04/09/2011   INR 4.89* 04/08/2011   INR 3.63* 04/07/2011  plan: - Vit K again today.   DIASTOLIC HEART FAILURE, CHRONIC  Lab 04/08/11 0430  PROBNP 24310.0*  Plan: -diurese today -trend BNP -BP control  Acute on chronic renal failure: baseline ~2.55-2.6  Recent Labs  Basename 04/09/11 0500  04/08/11 0430 04/07/11 1829   CREATININE 2.19* 2.52* 2.78*  plan -avoid nephrotoxins -follow up chemistry -diurese today, follow up am chemistry  DIABETES MELLITUS, TYPE II: excellent control  CBG (last 3)   Basename 04/09/11 0415 04/09/11 0005 04/08/11 2033  GLUCAP 89 84 92  Plan: -ssi  Hyperkalemia: suspect acidosis contributing. Now better.   Lab 04/09/11 0500 04/08/11 0430 04/07/11 1829  K 4.8 3.9 4.5  plan: -f/u bmp am  Best practices / Disposition: -->ICU status under PCCM -->full code -->Heparin for AF after INR <2.  -->Protonix for GI Px -->ventilator bundle -->diet: NPO, add TNA.  -->code status: full code. Does not want trach.   CC time 35 minutes.  Alyson Reedy, M.D. [facility] Pulmonary/Critical Care Medicine. Pager: [phone]. After hours pager: [phone].

## 2011-04-09 NOTE — Progress Notes (Signed)
Patient ID: [name], female   DOB: November 05, 1930, 77 y.o.   MRN: [id number]    Subjective: Remains on vent, but awake and following commands.  Still with abdominal pain greatest in LUQ but no worse.  Objective: Vital signs in last 24 hours: Temp:  [97.7 F (36.5 C)-99.5 F (37.5 C)] 97.7 F (36.5 C) (12/29 0810) Pulse Rate:  [76-101] 77  (12/29 0820) Resp:  [16-32] 18  (12/29 0820) BP: (94-159)/(34-54) 150/42 mmHg (12/29 0820) SpO2:  [95 %-99 %] 99 % (12/29 0820) Arterial Line BP: (104-158)/(33-53) 146/39 mmHg (12/29 0800) FiO2 (%):  [39.6 %-40.4 %] 40.4 % (12/29 0820) Weight:  [221 lb 12.5 oz (100.6 kg)] 221 lb 12.5 oz (100.6 kg) (12/29 0500) Last BM Date: 04/09/11  Intake/Output this shift: Total I/O In: -  Out: 200 [Urine:200]  Physical Exam: BP 150/42  Pulse 77  Temp(Src) 97.7 F (36.5 C) (Oral)  Resp 18  Ht 5\' 4"  (1.626 m)  Wt 221 lb 12.5 oz (100.6 kg)  BMI 38.07 kg/m2  SpO2 99% Abdomen obese, soft with guarding, tenderness greatest in LUQ  Labs: CBC  Basename 04/09/11 0500 04/08/11 0430  WBC 22.1* 18.3*  HGB 9.0* 8.3*  HCT 30.2* 28.5*  PLT 324 309   BMET  Basename 04/09/11 0500 04/08/11 0430  NA 140 138  K 4.8 3.9  CL 105 106  CO2 23 22  GLUCOSE 71 81  BUN 56* 61*  CREATININE 2.19* 2.52*  CALCIUM 8.2* 7.5*   LFT  Basename 04/08/11 0430  PROT 4.3*  ALBUMIN 1.6*  AST 25  ALT 12  ALKPHOS 44  BILITOT 0.2*  BILIDIR --  IBILI --  LIPASE --   PT/INR  Basename 04/09/11 0500 04/08/11 0430  LABPROT 38.9* 46.3*  INR 3.91* 4.89*   ABG  Basename 04/08/11 0431 04/07/11 2056  PHART 7.305* 7.288*  HCO3 22.9 23.8    Studies/Results: Dg Chest Port 1 View  04/09/2011  *RADIOLOGY REPORT*  Clinical Data: Evaluate lines and endotracheal tube  PORTABLE CHEST - 1 VIEW  Comparison: 04/08/2011, 04/07/2011, 04/06/2011 and 04/02/2011  Findings: Patient is markedly rotated to the left.  Endotracheal tube remains in satisfactory position.  A left IJ  central venous catheter does not cross the midline appears to terminate in the left brachiocephalic vein.  Nasogastric tube be followed to the lower esophagus and continues below the edge of the image.  There is dense consolidation with some air bronchograms in the mid to lower left lung with an associated moderate-sized left pleural effusion.  Posteriorly layering right pleural effusion is present, mild hazy opacities in the right lung.  No significant change in aeration.  IMPRESSION: No significant change in the chest.  Moderate bilateral pleural effusions and there is bilateral opacities and (atelectasis and/or airspace disease).  Original Report Authenticated By: Britta Mccreedy, M.D.   Dg Chest Port 1 View  04/08/2011  *RADIOLOGY REPORT*  Clinical Data: Respiratory difficulty  PORTABLE CHEST - 1 VIEW  Comparison: Yesterday  Findings: Endotracheal tube, NG tube, left neck vascular catheter are stable.  Bilateral pulmonary opacity is not significantly changed and remains worse on the left.  It likely represents a combination of pleural effusion and airspace disease.  No pneumothorax.  IMPRESSION: No significant interval change.  Stable support structures and bilateral pulmonary opacities left greater than right.  Original Report Authenticated By: Donavan Burnet, M.D.   Dg Chest Port 1 View  04/07/2011  *RADIOLOGY REPORT*  Clinical Data: Respiratory failure and  status post intubation and central line placement.  PORTABLE CHEST - 1 VIEW  Comparison: Film 1515 hours  Findings: Endotracheal tube tip lies approximately 1 cm above the carina.  Left jugular central lines been placed with the catheter tip transversely oriented in the left innominate vein.  No pneumothorax present.  Persistent dense consolidation of the left lung.  IMPRESSION: Endotracheal tube tip approximately 1 cm above the carina.  Central line tip lies in the left innominate vein.  No pneumothorax. Stable left lung consolidation.  Original  Report Authenticated By: Reola Calkins, M.D.   Dg Chest Port 1 View  04/07/2011  *RADIOLOGY REPORT*  Clinical Data: 77 year old female with respiratory failure.  PORTABLE CHEST - 1 VIEW  Comparison: 04/06/2011 earlier.  CT abdomen and pelvis 04/06/2011.  Findings: Portable semi upright AP view 1515 hours.  Continued confluent left mid and lower lung opacity with air bronchograms at the left hilum.  A component of pleural effusion is suspected. Less pronounced veiling opacity at the right lung base is stable. No overt pulmonary edema.  No pneumothorax.  Stable cardiac size and mediastinal contours.  IMPRESSION: 1.  Continued left pleural effusion with left lower lobe consolidation which most resembled pneumonia on the recent CT comparison. 2.  Stable small right pleural effusion.  Original Report Authenticated By: Harley Hallmark, M.D.   Dg Abd Portable 1v  04/07/2011  *RADIOLOGY REPORT*  Clinical Data: Follow up of ileus.  ABDOMEN - 1 VIEW  Comparison: CT of 04/06/2011.  Plain films of 04/05/2011.  Findings: Single supine view of the left side of the abdomen. Moderately degraded by patient's size, positioning, and exclusion of the right side of the abdomen.  Gas filled  bowel loop in the left side of the abdomen is favored to be within the colon.  Gas filled small bowel loops are improved.  Contrast is seen throughout the majority of the colon.  No gross pneumatosis or free intraperitoneal air.  Contrast seen within mildly prominent rectum and sigmoid.  A nasogastric tube terminates at the distal stomach.  IMPRESSION: Moderate to markedly degraded, nearly nondiagnostic single view of the abdomen.  Probable improvement in adynamic ileus, without gross free intraperitoneal air or pneumatosis.  Original Report Authenticated By: Consuello Bossier, M.D.    Assessment: Principal Problem:  *Diverticulitis large intestine Active Problems:  DIABETES MELLITUS, TYPE II  Atrial fibrillation  DIASTOLIC HEART  FAILURE, CHRONIC  COPD  RENAL FAILURE, CHRONIC  Abdominal pain, acute, right lower quadrant  Fever  Acute renal failure  Bradycardia  Acute-on-chronic respiratory failure  Atelectasis  Hyperkalemia  SIRS (systemic inflammatory response syndrome)     Plan: Will continue conservative management with IV antibiotics, TNA, NG, etc.  Will probably need repeat CT scan first of the week to re-evaluate for abscess INR drifting down.  LOS: 7 days    Kia Varnadore A 04/09/2011

## 2011-04-09 NOTE — Procedures (Signed)
Extubation Procedure Note  Patient Details:   Name: [name] DOB: 1930/11/29 MRN: [id number]   Airway Documentation:    Evaluation  O2 sats: stable throughout Complications: No apparent complications Patient did tolerate procedure well. Bilateral Breath Sounds: Diminished Suctioning: Airway Yes  Clearance Coots 04/09/2011, 5:46 PM Pt trialed on PS 5/5 x3hrs. VC 800cc's, good cuff leak. Pt extubated and now wearing 4lpm [place].

## 2011-04-09 NOTE — Progress Notes (Signed)
ANTICOAGULATION CONSULT NOTE - Follow Up Consult Pharmacist System-Based Medication Review: Anticoagulation - Afib: Coumadin PTA, INR down  to 3.9 today s/p 3 days flagyl/cipro -(Home Coumadin dose 2mg  MWFSS 1mg  TT) .  Heparin when INR <2  Infectious Disease - Hx of jejunal perforation. CT remarkable for diffuse diverticulitis of the jejunum. Vanc/zosyn for #3 for R/O HCAP, stopped flagyl/cipro; 12/22 urine > 100 K e coli. Which is pan sensitive Bld cxs have been ngtd. Wbc up to 22 today. CCS doesn't think abd is the source.  Cardiovascular - VSS; Hx Diastolic heart failure/HTN/afib/HL: on home simvastatin, fenofibrate, home BP meds currently on hold d/t bradycardia and soft pressures which is resolving with HR in 80-100s.  Endocrinology - DM2: No home meds meds for DM2, CBGs <84-103, stable on sensitive SSI. Hypothyroid: TSH 3.64 01/04/2011, no home medication listed. Gout: No home meds or symptomatic complaints.  Gastrointestinal / Nutrition - Diverticulitis. May need repeat CT if worsen. No emergent OR needed per CCS  Neurology - Propofol  Nephrology - CRI: Scr down to 2.52 (from 3.25) CrCl ~23ml/min, baseline is 2.4 (per MD note). I/O +13  Pulmonary - COPD: On home advair and spiriva. Came in finishing prednisone taper (?COPD exacerbation). 97% vent FiO2 40%  Hematology / Oncology - Anemia. H/H trending upward now, pltc wnl  PTA Medication Issues -f/u resuming home BP meds when able  Best Practices - VTE prophylaxis: Coumadin (Home Coumadin dose 2mg  MWFSS 1mg  TT) Plan:  1. Day 3 Vancomycin 1000mg  IV q24 hrs  2.Consider vancomycin trough next 1-2 days 3. Day 3 Zosyn 2.25 gm IV q6h 4. Hold coumadin 5.INR with am labs 6. Heparin when INR <2 04/09/2011 Sheppard Coil PharmD.

## 2011-04-09 NOTE — Progress Notes (Signed)
Nutrition Follow-up/Consult (New TPN)  Diet Order:  NPO; patient requiring TPN due to altered GI function complicated by ileus.  Patient to begin TPN with Clinimix 5/15 @ 40 ml/hr.  Lipids (20% IVFE), multivitamins, and trace elements are provided 3 times weekly (MWF) due to national backorder.  Provides 682 kcal and 48 grams protein daily.  Meets 40% minimum estimated kcal and 64% minimum estimated protein needs.  Additional IVF with NS @ 10-20 ml/hr.  Propofol is off.  NG tube in place for decompression.    Meds: Scheduled Meds:   . albuterol  6 puff Inhalation QID  . antiseptic oral rinse  15 mL Mouth Rinse QID  . fluticasone  2 puff Inhalation BID  . furosemide  40 mg Intravenous Q8H  . insulin aspart  0-9 Units Subcutaneous Q4H  . ipratropium  6 puff Inhalation QID  . pantoprazole (PROTONIX) IV  40 mg Intravenous Q24H  . phytonadione  10 mg Subcutaneous Once  . phytonadione  10 mg Subcutaneous Once  . piperacillin-tazobactam (ZOSYN)  IV  2.25 g Intravenous Q6H  . sodium chloride      . vancomycin  1,000 mg Intravenous Q24H  . DISCONTD: albuterol  2.5 mg Nebulization QID  . DISCONTD: budesonide  0.25 mg Nebulization BID  . DISCONTD: chlorhexidine  15 mL Mouth/Throat BID  . DISCONTD: furosemide      . DISCONTD: ipratropium  2 puff Inhalation Q4H  . DISCONTD: ipratropium  0.5 mg Nebulization QID   Continuous Infusions:   . sodium chloride 50 mL/hr at 04/08/11 1835  . TPN (CLINIMIX) +/- additives    . DISCONTD: propofol 3 mcg/kg/min (04/09/11 0900)   PRN Meds:.acetaminophen, DISCONTD: fentaNYL  Labs:  CMP     Component Value Date/Time   NA 140 04/09/2011 0500   K 4.8 04/09/2011 0500   CL 105 04/09/2011 0500   CO2 23 04/09/2011 0500   GLUCOSE 71 04/09/2011 0500   BUN 56* 04/09/2011 0500   CREATININE 2.19* 04/09/2011 0500   CREATININE 2.65* 06/18/2010 1715   CALCIUM 8.2* 04/09/2011 0500   PROT 4.3* 04/08/2011 0430   ALBUMIN 1.6* 04/08/2011 0430   AST 25 04/08/2011  0430   ALT 12 04/08/2011 0430   ALKPHOS 44 04/08/2011 0430   BILITOT 0.2* 04/08/2011 0430   GFRNONAA 20* 04/09/2011 0500   GFRAA 24* 04/09/2011 0500   CBG (last 3)   Basename 04/09/11 0749 04/09/11 0415 04/09/11 0005  GLUCAP 96 89 84     Intake/Output Summary (Last 24 hours) at 04/09/11 1200 Last data filed at 04/09/11 0900  Gross per 24 hour  Intake   1194 ml  Output   1540 ml  Net   -346 ml    Weight Status:  100.6 kg (up slightly due to fluids)  Nutrition Dx:  Inadequate oral intake, ongoing.  Goal:  Nutrition therapy to provide 60-70% of estimated calorie needs (22-25 kcals/kg ideal body weight) and 100% of estimated protein needs, based on ASPEN guidelines for permissive underfeeding in critically ill obese individuals.  Intervention:  Goal for calories from TPN is 1000-1200 kcals.  May not be able to meet nutrition goals for patient due to lipid backorder and premixed Clinimix solution.  Monitor:  Labs, weight trend, tolerance of TPN.   Hettie Holstein Pager #:  [phone]

## 2011-04-10 ENCOUNTER — Inpatient Hospital Stay (HOSPITAL_COMMUNITY): Payer: Medicare Other

## 2011-04-10 LAB — BLOOD GAS, ARTERIAL
Drawn by: 347791
TCO2: 27.6 mmol/L (ref 0–100)
pCO2 arterial: 45.4 mmHg — ABNORMAL HIGH (ref 35.0–45.0)
pH, Arterial: 7.376 (ref 7.350–7.400)
pO2, Arterial: 66.1 mmHg — ABNORMAL LOW (ref 80.0–100.0)

## 2011-04-10 LAB — DIFFERENTIAL
Basophils Relative: 0 % (ref 0–1)
Eosinophils Relative: 2 % (ref 0–5)
Monocytes Absolute: 0.4 10*3/uL (ref 0.1–1.0)
Monocytes Relative: 2 % — ABNORMAL LOW (ref 3–12)
Neutrophils Relative %: 93 % — ABNORMAL HIGH (ref 43–77)

## 2011-04-10 LAB — TRIGLYCERIDES: Triglycerides: 293 mg/dL — ABNORMAL HIGH (ref <150)

## 2011-04-10 LAB — COMPREHENSIVE METABOLIC PANEL
ALT: 10 U/L (ref 0–35)
Albumin: 1.6 g/dL — ABNORMAL LOW (ref 3.5–5.2)
Calcium: 8.5 mg/dL (ref 8.4–10.5)
GFR calc Af Amer: 29 mL/min — ABNORMAL LOW (ref >90)
Glucose, Bld: 85 mg/dL (ref 70–99)
Potassium: 3.5 mEq/L (ref 3.5–5.1)
Sodium: 145 mEq/L (ref 135–145)
Total Protein: 5.1 g/dL — ABNORMAL LOW (ref 6.0–8.3)

## 2011-04-10 LAB — PREALBUMIN: Prealbumin: 8 mg/dL — ABNORMAL LOW (ref 17.0–34.0)

## 2011-04-10 LAB — PROCALCITONIN: Procalcitonin: 1.45 ng/mL

## 2011-04-10 LAB — GLUCOSE, CAPILLARY
Glucose-Capillary: 160 mg/dL — ABNORMAL HIGH (ref 70–99)
Glucose-Capillary: 88 mg/dL (ref 70–99)
Glucose-Capillary: 93 mg/dL (ref 70–99)

## 2011-04-10 LAB — CBC
Hemoglobin: 8.7 g/dL — ABNORMAL LOW (ref 12.0–15.0)
MCH: 27.1 pg (ref 26.0–34.0)
MCHC: 30.2 g/dL (ref 30.0–36.0)
Platelets: 298 10*3/uL (ref 150–400)
RDW: 17.1 % — ABNORMAL HIGH (ref 11.5–15.5)

## 2011-04-10 LAB — PROTIME-INR
INR: 1.79 — ABNORMAL HIGH (ref 0.00–1.49)
Prothrombin Time: 21.1 seconds — ABNORMAL HIGH (ref 11.6–15.2)

## 2011-04-10 LAB — MAGNESIUM: Magnesium: 2 mg/dL (ref 1.5–2.5)

## 2011-04-10 MED ORDER — CLINIMIX/DEXTROSE (5/15) 5 % IV SOLN
INTRAVENOUS | Status: AC
  Administered 2011-04-10: 19:00:00 via INTRAVENOUS
  Filled 2011-04-10: qty 1000

## 2011-04-10 MED ORDER — PIPERACILLIN-TAZOBACTAM 3.375 G IVPB
3.3750 g | Freq: Three times a day (TID) | INTRAVENOUS | Status: DC
  Administered 2011-04-10 – 2011-04-19 (×27): 3.375 g via INTRAVENOUS
  Filled 2011-04-10 (×29): qty 50

## 2011-04-10 MED ORDER — METHYLPREDNISOLONE SODIUM SUCC 40 MG IJ SOLR
40.0000 mg | Freq: Every day | INTRAMUSCULAR | Status: DC
  Administered 2011-04-10 – 2011-04-12 (×3): 40 mg via INTRAVENOUS
  Filled 2011-04-10 (×3): qty 1

## 2011-04-10 MED ORDER — SODIUM CHLORIDE 0.9 % IJ SOLN
INTRAMUSCULAR | Status: AC
  Administered 2011-04-10: 30 mL
  Filled 2011-04-10: qty 30

## 2011-04-10 MED ORDER — HEPARIN SOD (PORCINE) IN D5W 100 UNIT/ML IV SOLN
1650.0000 [IU]/h | INTRAVENOUS | Status: DC
  Administered 2011-04-10 (×2): 1400 [IU]/h via INTRAVENOUS
  Administered 2011-04-11 – 2011-04-15 (×7): 1650 [IU]/h via INTRAVENOUS
  Filled 2011-04-10 (×19): qty 250

## 2011-04-10 MED ORDER — ALBUTEROL SULFATE HFA 108 (90 BASE) MCG/ACT IN AERS
2.0000 | INHALATION_SPRAY | Freq: Four times a day (QID) | RESPIRATORY_TRACT | Status: DC
  Administered 2011-04-10 – 2011-04-11 (×2): 2 via RESPIRATORY_TRACT

## 2011-04-10 MED ORDER — MORPHINE SULFATE 2 MG/ML IJ SOLN
2.0000 mg | INTRAMUSCULAR | Status: DC | PRN
  Administered 2011-04-10 – 2011-04-18 (×8): 2 mg via INTRAVENOUS
  Filled 2011-04-10 (×9): qty 1

## 2011-04-10 MED ORDER — IPRATROPIUM BROMIDE HFA 17 MCG/ACT IN AERS
2.0000 | INHALATION_SPRAY | Freq: Four times a day (QID) | RESPIRATORY_TRACT | Status: DC
  Administered 2011-04-10 – 2011-04-11 (×2): 2 via RESPIRATORY_TRACT

## 2011-04-10 NOTE — Progress Notes (Signed)
Patient ID: [name], female   DOB: 06/05/1930, 77 y.o.   MRN: [id number]    Subjective: Extubated. Denies flatus. Denies abd pain but states 'tenderness' on left; also c/o right heel pain  Objective: Vital signs in last 24 hours: Temp:  [97.4 F (36.3 C)-98.3 F (36.8 C)] 97.6 F (36.4 C) (12/30 0800) Pulse Rate:  [67-84] 67  (12/30 0700) Resp:  [14-20] 15  (12/30 0700) BP: (99-148)/(33-85) 111/36 mmHg (12/30 0700) SpO2:  [95 %-100 %] 99 % (12/30 0814) Arterial Line BP: (134-164)/(37-54) 137/50 mmHg (12/29 2000) FiO2 (%):  [39.6 %-40.3 %] 40.1 % (12/29 1120) Weight:  [213 lb 10 oz (96.9 kg)] 213 lb 10 oz (96.9 kg) (12/30 0500) Last BM Date: 04/09/11  Intake/Output this shift:    Physical Exam: BP 111/36  Pulse 67  Temp(Src) 97.6 F (36.4 C) (Oral)  Resp 15  Ht 5\' 4"  (1.626 m)  Wt 213 lb 10 oz (96.9 kg)  BMI 36.67 kg/m2  SpO2 99%  Awake, alert, nad cta ant Reg Obese, soft, +bs, some TTP in LUQ/left side. No RT. No peritonitis  Labs: CBC  Basename 04/10/11 0530 04/09/11 0500  WBC 22.4* 22.1*  HGB 8.7* 9.0*  HCT 28.8* 30.2*  PLT 298 324   BMET  Basename 04/10/11 0530 04/09/11 0500  NA 145 140  K 3.5 4.8  CL 106 105  CO2 26 23  GLUCOSE 85 71  BUN 46* 56*  CREATININE 1.84* 2.19*  CALCIUM 8.5 8.2*   LFT  Basename 04/10/11 0530  PROT 5.1*  ALBUMIN 1.6*  AST 16  ALT 10  ALKPHOS 60  BILITOT 0.3  BILIDIR --  IBILI --  LIPASE --   PT/INR  Basename 04/10/11 0530 04/09/11 1130  LABPROT 21.1* 33.6*  INR 1.79* 3.24*   ABG  Basename 04/10/11 0440 04/09/11 1536  PHART 7.376 7.375  HCO3 26.2* 25.4*    Studies/Results: Dg Chest Port 1 View  04/09/2011  *RADIOLOGY REPORT*  Clinical Data: Evaluate lines and endotracheal tube  PORTABLE CHEST - 1 VIEW  Comparison: 04/08/2011, 04/07/2011, 04/06/2011 and 04/02/2011  Findings: Patient is markedly rotated to the left.  Endotracheal tube remains in satisfactory position.  A left IJ central venous  catheter does not cross the midline appears to terminate in the left brachiocephalic vein.  Nasogastric tube be followed to the lower esophagus and continues below the edge of the image.  There is dense consolidation with some air bronchograms in the mid to lower left lung with an associated moderate-sized left pleural effusion.  Posteriorly layering right pleural effusion is present, mild hazy opacities in the right lung.  No significant change in aeration.  IMPRESSION: No significant change in the chest.  Moderate bilateral pleural effusions and there is bilateral opacities and (atelectasis and/or airspace disease).  Original Report Authenticated By: Britta Mccreedy, M.D.   Dg Abd Portable 1v  04/09/2011  *RADIOLOGY REPORT*  Clinical Data: Follow-up ileus.  ABDOMEN - 1 VIEW  Comparison: 04/07/2011  Findings: A single view of the abdomen demonstrates gas within nondilated colonic loops.  There is contrast within the rectosigmoid colon following previous CT exam on 04/06/2011.  No dilated loops are identified.  There is degenerative change in the lumbar spine.  IMPRESSION: No evidence for bowel obstruction.  Original Report Authenticated By: Patterson Hammersmith, M.D.    Assessment: Principal Problem:  *Diverticulitis- Jejunal Active Problems:  DIABETES MELLITUS, TYPE II  Atrial fibrillation  DIASTOLIC HEART FAILURE, CHRONIC  COPD  RENAL  FAILURE, CHRONIC  Abdominal pain, acute, right lower quadrant  Fever  Acute renal failure  Bradycardia  Acute-on-chronic respiratory failure  Atelectasis  Hyperkalemia  SIRS (systemic inflammatory response syndrome) Protein calorie malnutrition     Plan: Will continue conservative management with IV antibiotics, NG tube, bowel rest. INR drifting down. Needs TPN Will repeat CT abd/pelvis tues/wed  [name]. Andrey Campanile, MD, FACS General, Bariatric, & Minimally Invasive Surgery [facility], [place]   LOS: 8 days    [name] 04/10/2011

## 2011-04-10 NOTE — Progress Notes (Addendum)
ANTICOAGULATION CONSULT NOTE - Follow Up Consult  Pharmacy Consult for Heparin when INR <2 Indication: atrial fibrillation  Allergies  Allergen Reactions  . Sulfonamide Derivatives     Childhood reaction    Pharmacist System-Based Medication Review: Anticoagulation - Afib: Coumadin PTA, INR down to 1.79 s/p vitamin K 10 mg SQ,x2 -(Home Coumadin dose 2mg  MWFSS 1mg  TT) .  Will start heparin working around plan for PICC placement today, plan no bolus given plan for line. Infectious Disease - R/O HCAP d#5 Vanc/zosyn, History of  perforation. CT remarkable for diffuse diverticulitis of the jejunum.  stopped flagyl/cipro; 12/22 urine > 100 K e coli.  Bld cxs 12/22 Final Neg, Blood 12/27 NGTD, Afeb, Wbc remains 22 today.  SCr improved to 1.8 will change Zosyn to 3.375 g IV q8h and continue vancomycin at current dose, anticipate trough tomorrow if antibiotics continue. Cardiovascular  Hx Diastolic heart failure/HTN/afib/HL: Lasix 40 mg IV q8h; PTA simvastatin, fenofibrate, home BP meds currently on hold d/t bradycardia and low pressures SBP 105-111 Endocrinology - DM2: No home meds meds for DM2, stable on sensitive SSI. Hypothyroid: TSH 3.64 01/04/2011, no home medication listed. Gout: No home meds or symptomatic complaints. Gastrointestinal / Nutrition - Diverticulitis. May need repeat CT if worsen. No emergent OR needed per CCS, But NPO and started TPN today Neurology - Propofol  Nephrology - CRI: CrCl ~28 ml/min, baseline SCr is 2.4 (per MD note).  Pulmonary - COPD: Extubated this am, On home advair and spiriva. Came in finishing prednisone taper (?COPD exacerbation). Hematology / Oncology - Anemia. H/H 8.7/28.8 pltc wnl PTA Medication Issues -f/u resuming home BP meds when clinically appropriate Best Practices - VTE prophylaxis: Coumadin (Home Coumadin dose 2mg  MWFSS 1mg  TT); IV protonix  Assessment: [name] with Afib and ongoing supertherapeutic INR after course of metronidazole,  INR now <2,  will start heparin at 1400 units/h with no bolus given plan for PICC placement this am Goal of Therapy:  Heparin level 0.3-0.7 units/ml   Plan:  1. Heparin 1400 units/hr 2. Check heparin level 8h s/p ggt starts 3. Daily heparin level and CBC 4. Zosyn 3.375 g IV q8h infuse over 4h  Medications:  Prescriptions prior to admission  Medication Sig Dispense Refill  . albuterol (PROVENTIL) (2.5 MG/3ML) 0.083% nebulizer solution Take 3 mLs (2.5 mg total) by nebulization every 6 (six) hours as needed for wheezing.  75 mL  12  . albuterol (VENTOLIN HFA) 108 (90 BASE) MCG/ACT inhaler Inhale 2 puffs into the lungs every 4 (four) hours as needed for wheezing.  1 Inhaler  5  . amLODipine (NORVASC) 10 MG tablet Take 10 mg by mouth daily.        . fenofibrate (TRICOR) 48 MG tablet Take 48 mg by mouth daily.       . magnesium oxide (MAG-OX) 400 MG tablet Take 400 mg by mouth 3 (three) times daily.        . metoprolol tartrate (LOPRESSOR) 25 MG tablet Take 25 mg by mouth 2 (two) times daily.        . Mometasone Furo-Formoterol Fum (DULERA) 200-5 MCG/ACT AERO Inhale 2 puffs into the lungs 2 (two) times daily.  1 Inhaler  0  . ranitidine (ZANTAC) 150 MG capsule Take 150 mg by mouth every evening.        . simvastatin (ZOCOR) 20 MG tablet Take 1 tablet (20 mg total) by mouth at bedtime.  90 tablet  3  . tiotropium (SPIRIVA) 18 MCG inhalation  capsule Place 1 capsule (18 mcg total) into inhaler and inhale daily.  30 capsule  11  . triamcinolone (KENALOG) 0.1 % cream Apply topically 2 (two) times daily.  454 g  1  . warfarin (COUMADIN) 2 MG tablet Take 2 mg by mouth daily. As directed: need to verify dose       . furosemide (LASIX) 40 MG tablet Take 40 mg by mouth 2 (two) times daily.        . NON FORMULARY OXYGEN. 2 liters at bedtime and as needed during the day.       . predniSONE (DELTASONE) 20 MG tablet Take 40 mg by mouth daily. Leftover from previous course       . warfarin (COUMADIN) 2 MG tablet TAKE ONE  TABLET BY MOUTH EVERY DAY AS DIRECTED  30 tablet  5   Patient Measurements: Height: 5\' 4"  (162.6 cm) Weight: 213 lb 10 oz (96.9 kg) IBW/kg (Calculated) : 54.7   Vital Signs: Temp: 97.6 F (36.4 C) (12/30 0800) Temp src: Oral (12/30 0800) BP: 111/36 mmHg (12/30 0700) Pulse Rate: 67  (12/30 0700)  Labs:  Basename 04/10/11 0530 04/09/11 1130 04/09/11 0500 04/08/11 0430  HGB 8.7* -- 9.0* --  HCT 28.8* -- 30.2* 28.5*  PLT 298 -- 324 309  APTT -- -- -- --  LABPROT 21.1* 33.6* 38.9* --  INR 1.79* 3.24* 3.91* --  HEPARINUNFRC -- -- -- --  CREATININE 1.84* -- 2.19* 2.52*  CKTOTAL -- -- -- --  CKMB -- -- -- --  TROPONINI -- -- -- --   Estimated Creatinine Clearance: 28.5 ml/min (by C-G formula based on Cr of 1.84).  [name] [name] 04/10/2011,10:47 AM

## 2011-04-10 NOTE — Progress Notes (Signed)
PARENTERAL NUTRITION CONSULT NOTE - FOLLOW UP  Pharmacy Consult for TPN Indication: Prolonged Ileus  Allergies  Allergen Reactions  . Sulfonamide Derivatives     Childhood reaction    Patient Measurements: Height: 5\' 4"  (162.6 cm) Weight: 213 lb 10 oz (96.9 kg) IBW/kg (Calculated) : 54.7  Adjusted Body Weight: 67.4kg  Vital Signs: Temp: 97.6 F (36.4 C) (12/30 0800) Temp src: Oral (12/30 0800) BP: 111/36 mmHg (12/30 0700) Pulse Rate: 67  (12/30 0700) Intake/Output from previous day: 12/29 0701 - 12/30 0700 In: 1373.6 [I.V.:969.6; IV Piggyback:404] Out: 3425 [Urine:3175; Emesis/NG output:250] Intake/Output from this shift: Total I/O In: 40 [I.V.:40] Out: -   Labs:  Basename 04/10/11 0530 04/09/11 1130 04/09/11 0500 04/08/11 0430  WBC 22.4* -- 22.1* 18.3*  HGB 8.7* -- 9.0* 8.3*  HCT 28.8* -- 30.2* 28.5*  PLT 298 -- 324 309  APTT -- -- -- --  INR 1.79* 3.24* 3.91* --     Basename 04/10/11 0530 04/09/11 0500 04/08/11 0430  NA 145 140 138  K 3.5 4.8 3.9  CL 106 105 106  CO2 26 23 22   GLUCOSE 85 71 81  BUN 46* 56* 61*  CREATININE 1.84* 2.19* 2.52*  LABCREA -- -- --  CREAT24HRUR -- -- --  CALCIUM 8.5 8.2* 7.5*  MG 2.0 2.3 --  PHOS 3.2 3.2 --  PROT 5.1* -- 4.3*  ALBUMIN 1.6* -- 1.6*  AST 16 -- 25  ALT 10 -- 12  ALKPHOS 60 -- 44  BILITOT 0.3 -- 0.2*  BILIDIR -- -- --  IBILI -- -- --  PREALBUMIN 8.0* -- --  TRIG 293* -- --  CHOLHDL -- -- --  CHOL 91 -- --   Estimated Creatinine Clearance: 28.5 ml/min (by C-G formula based on Cr of 1.84).    Basename 04/10/11 0746 04/10/11 0422 04/10/11 0021  GLUCAP 93 94 93    Medications:  Scheduled:    . albuterol  6 puff Inhalation QID  . antiseptic oral rinse  15 mL Mouth Rinse QID  . fluticasone  2 puff Inhalation BID  . furosemide  40 mg Intravenous Q8H  . insulin aspart  0-9 Units Subcutaneous Q4H  . ipratropium  6 puff Inhalation QID  . pantoprazole (PROTONIX) IV  40 mg Intravenous Q24H  .  phytonadione  10 mg Subcutaneous Once  . piperacillin-tazobactam (ZOSYN)  IV  2.25 g Intravenous Q6H  . sodium chloride      . vancomycin  1,000 mg Intravenous Q24H   Infusions:    . sodium chloride 20 mL/hr (04/09/11 1246)  . heparin    . TPN (CLINIMIX) +/- additives      Insulin Requirements in the past 24 hours:  None, CBGs <100  Current Nutrition:  None, TPN was not started due to a mispositioned central line  Assessment: Diverticulitis with abscess and ileus: to begin nutritional support with TPN today. Note permissive underfeeding kcal goals per ASPEN guidelines and RD recommendations. Her current prealbumin is 8, as would be expected due to critical illness and elapsed time without nutritional support.   Her triglycerides are elevated, and from a review of her records she had TG levels in the 400-500 range in 2010-2011.  Some of this elevation may be due to recent therapy with Propofol. She also has chronic renal insufficiency and her electrolytes are currently within normal limits without supplementation, so I will select our electrolyte-free formula for her initial TPN.  Please note: Lipids, MVI, and Trace Elements will be  provided on MWF only due to shortages. These shortages, combined with use of premix TPN, will limit our ability to meet protein goals.   Nutritional Goals:  1017-1187 kCal, 75-85 grams of protein per day  Plan:  1. Start Clinimix 5/15 (without electrolytes) at 34ml/hr. This will provide 682kCal/day and 48gm protein/day.  2. Advance as tolerated to a goal rate of 89ml/hr. This will provide an average of 1151kcal/day and 66gm of protein/day.  3. Monitor electrolytes closely given history of CRI and potential for refeeding.  4. Monitor CBGs and insulin requirements. No adjustments currently indicated to her existing orders. 5. Monitor Triglycerides at least weekly while receiving Lipid supplementation.  Estella Husk, Pharm.D., BCPS Clinical Pharmacist    Pager [phone] 04/10/2011, 11:35 AM

## 2011-04-10 NOTE — Procedures (Signed)
Central Venous Catheter Insertion Procedure Note [name] [id number] 1930/07/24  Procedure: Insertion of Central Venous Catheter Indications: Drug and/or fluid administration  Procedure Details Consent: Risks of procedure as well as the alternatives and risks of each were explained to the (patient/caregiver).  Consent for procedure obtained. Time Out: Verified patient identification, verified procedure, site/side was marked, verified correct patient position, special equipment/implants available, medications/allergies/relevent history reviewed, required imaging and test results available.  Performed  Maximum sterile technique was used including antiseptics, cap, gloves, gown, hand hygiene, mask and sheet. Skin prep: Chlorhexidine; local anesthetic administered A antimicrobial bonded/coated triple lumen catheter was placed in the right internal jugular vein using the Seldinger technique.  Evaluation Blood flow good Complications: No apparent complications Patient did tolerate procedure well. Chest X-ray ordered to verify placement.  CXR: pending.  Monta Police 04/10/2011, 5:03 PM

## 2011-04-10 NOTE — Progress Notes (Signed)
Attempt to insert PICC line x 2.  Unable to thread guidewire.   No suitable veins Rt. Arm.  Attempted Lt. Arm x 2.  May consider referral to IR.

## 2011-04-10 NOTE — Progress Notes (Signed)
Name: [name] MRN: [id number] DOB: 03/12/1931    LOS: 8  [facility]  History of Present Illness:  77 year old white female with multiple medical co-morbids: COPD w/ chronic resp failure (O2 dependant 2 liters), CRI w/ BL scr of ~2.55, MO, ANCA positive pauci-immune GN, prior ARDS required trach, CAF and prior perforation of jejunum. Presented on 12/22 with ~ 1-1.5 wk h/o abd pain, particularly over RLQ.Dx eval demonstrated: severe diverticulitis of jejunum. She was admitted to medical service. Rx has included IVFs, and empiric antibiotics. On 12/26 developed progressive hypoxia and antibiotic selection was widened to cover for possible HCAP.  On 7/27 she developed progressive MS change, ABG showed mixed respiratory and metabolic acidosis. She was placed on NIPPV, PCCM asked to eval.   Lines / Drains: L IJ CVC 12/27 >> R radial art line 12/27 >>12/30 oett 12/27>>>12/29  Cultures: 12/22 MRSA: positive 12/22 BCX2>>>neg 12/22 UC: e coli (>100K/pan sens) Lactate 12/27>>> 0.6 -> 0.7 Procalcitonin 12/27>>> 3.42 -> 2.26  Antibiotics: Cipro 12/22>>>12/26 Flagyl 12/22>>>12/26 Zosyn (sepsis, ? HCAP) 12/26>>> Vanc (sepsis ? HCAP) 12/26>>>12/30  Tests / Events: CT abd 12/26: 1. Overall findings suggestive of worsening acute diverticulitis of  the proximal jejunum with increase in the amount of fluid within the left paracolic gutter, about liver and extending into the  pelvis. There is a small fluid collection within the deep ventral  mesentery which is grossly unchanged in size, measuring 2.5 x 3.3  cm in diameter. Interval increase in amount of subcutaneous edema  about the left lateral abdominal wall. 2. Findings suggestive of early/partial small bowel obstruction about the level of acute diverticulitis.  3. Interval increase in size of bilateral pleural effusions and adjacent consolidative opacities, left greater than right, possibly atelectasis although  underlying infection is not excluded. 4. Cholelithiasis.   Subjective No distress. Very comfortable on SBT.  Vital Signs: Temp:  [97.4 F (36.3 C)-98.3 F (36.8 C)] 97.6 F (36.4 C) (12/30 0800) Pulse Rate:  [67-84] 67  (12/30 0700) Resp:  [14-20] 15  (12/30 0700) BP: (99-140)/(33-85) 111/36 mmHg (12/30 0700) SpO2:  [95 %-100 %] 99 % (12/30 0814) Arterial Line BP: (134-164)/(37-54) 137/50 mmHg (12/29 2000) Weight:  [96.9 kg (213 lb 10 oz)] 213 lb 10 oz (96.9 kg) (12/30 0500) CVP:  7-9    . sodium chloride 20 mL/hr (04/09/11 1246)  . heparin    . TPN (CLINIMIX) +/- additives      Intake/Output Summary (Last 24 hours) at 04/10/11 1147 Last data filed at 04/10/11 0900  Gross per 24 hour  Intake   1220 ml  Output   2975 ml  Net  -1755 ml     Physical Examination: General:  Chronically ill appearing white female, awake, oriented and appropriate on vent  Neuro:  Baseline, has some generalized weakness HEENT:  Mucous membranes are dry, No JVD, orally intubated Cardiovascular:  Regular irregular, AF on tele Lungs: scattered rhonchi, decreased in bases.  Abdomen:  large, soft to palp. Tenderness improved.  Musculoskeletal:  No sig edema, brisk CR Skin:  Chronic venous stasis changes.   Ventilator settings:    Labs and Imaging:   Lab 04/10/11 0530 04/09/11 0500 04/08/11 0430  NA 145 140 138  K 3.5 4.8 3.9  CL 106 105 106  CO2 26 23 22   BUN 46* 56* 61*  CREATININE 1.84* 2.19* 2.52*  GLUCOSE 85 71 81    Lab 04/10/11 0530 04/09/11 0500 04/08/11 0430  HGB 8.7* 9.0*  8.3*  HCT 28.8* 30.2* 28.5*  WBC 22.4* 22.1* 18.3*  PLT 298 324 309   Lab Results  Component Value Date   INR 1.79* 04/10/2011   INR 3.24* 04/09/2011   INR 3.91* 04/09/2011   ABG    Component Value Date/Time   PHART 7.376 04/10/2011 0440   PCO2ART 45.4* 04/10/2011 0440   PO2ART 66.1* 04/10/2011 0440   HCO3 26.2* 04/10/2011 0440   TCO2 27.6 04/10/2011 0440   ACIDBASEDEF 3.0* 04/08/2011 0431    O2SAT 92.8 04/10/2011 0440   PCXR: CM, aeration improved. Does have bibasilar volume loss.   Assessment and Plan: Diverticulitis of jejunum with resultant SIRS/sepsis:  complicated by ileus. Surgery following. CT did show small fluid collection, CCS following on medical rx, consider repeat CT if she clinically changes, otherwise she will need CT scan on tues/wed.   Lab 04/10/11 0530 04/09/11 0500 04/08/11 0430  WBC 22.4* 22.1* 18.3*    Lab 04/10/11 0530 04/08/11 0430 04/07/11 0915  PROCALCITON 1.45 2.26 3.42  plan: -continue current abx/surgery rec cont conservative rx.  -placed NGT for decompression -TNA for bowel rest  Acute-on-chronic respiratory failure in setting of  COPD now acutely decompensating in the setting of hypoventilation d/t obesity, abd pain and Atelectasis: CXR improved after diuresis. Tolerated extubation well on 12/29. Plan: -wean FIO2 -scheduled BDs -keep even to negative  fluid balance -mobilize  Atrial fibrillation,   Plan: -rate control -holding coumadin, heparin today as  INR drifted below 2  Coumadin induced coagulopathy Lab Results  Component Value Date   INR 1.79* 04/10/2011   INR 3.24* 04/09/2011   INR 3.91* 04/09/2011  plan: -heparin gtt  DIASTOLIC HEART FAILURE, CHRONIC  Lab 04/08/11 0430  PROBNP 24310.0*  Plan: -keep even balance  -trend BNP -BP control  Acute on chronic renal failure: baseline ~2.55-2.6 , tolerated lasix well on 12/29 Recent Labs  Basename 04/10/11 0530 04/09/11 0500 04/08/11 0430   CREATININE 1.84* 2.19* 2.52*  plan -avoid nephrotoxins -follow up chemistry  DIABETES MELLITUS, TYPE II: excellent control  CBG (last 3)   Basename 04/10/11 0746 04/10/11 0422 04/10/11 0021  GLUCAP 93 94 93  Plan: -ssi  Hyporkalemia: Lab 04/10/11 0530 04/09/11 0500 04/08/11 0430  K 3.5 4.8 3.9  plan: -replace and recheck  -f/u bmp am  Gout flair Plan: -add solumedrol   Best practices / Disposition: -->tele given  AF, triad to reassume.  -->full code -->Heparin for AF after INR <2.  -->Protonix for GI Px -->ventilator bundle -->diet: NPO, add TNA.  -->code status: full code. Does not want trach.   Patient seen and examined, agree with above note.  I dictated the care and orders written for this patient under my direction.  [name], M.D. [phone]

## 2011-04-11 ENCOUNTER — Inpatient Hospital Stay (HOSPITAL_COMMUNITY): Payer: Medicare Other

## 2011-04-11 ENCOUNTER — Encounter (HOSPITAL_COMMUNITY): Payer: Self-pay | Admitting: Radiology

## 2011-04-11 LAB — CBC
MCH: 26.5 pg (ref 26.0–34.0)
MCHC: 29.7 g/dL — ABNORMAL LOW (ref 30.0–36.0)
MCV: 89.3 fL (ref 78.0–100.0)
Platelets: 318 10*3/uL (ref 150–400)

## 2011-04-11 LAB — COMPREHENSIVE METABOLIC PANEL
ALT: 10 U/L (ref 0–35)
Alkaline Phosphatase: 54 U/L (ref 39–117)
BUN: 44 mg/dL — ABNORMAL HIGH (ref 6–23)
CO2: 27 mEq/L (ref 19–32)
Chloride: 106 mEq/L (ref 96–112)
GFR calc Af Amer: 32 mL/min — ABNORMAL LOW (ref >90)
GFR calc non Af Amer: 27 mL/min — ABNORMAL LOW (ref >90)
Glucose, Bld: 269 mg/dL — ABNORMAL HIGH (ref 70–99)
Potassium: 3.7 mEq/L (ref 3.5–5.1)
Sodium: 143 mEq/L (ref 135–145)
Total Bilirubin: 0.2 mg/dL — ABNORMAL LOW (ref 0.3–1.2)

## 2011-04-11 LAB — DIFFERENTIAL
Eosinophils Absolute: 0 10*3/uL (ref 0.0–0.7)
Eosinophils Relative: 0 % (ref 0–5)
Lymphs Abs: 1 10*3/uL (ref 0.7–4.0)
Monocytes Absolute: 0.2 10*3/uL (ref 0.1–1.0)
Neutrophils Relative %: 95 % — ABNORMAL HIGH (ref 43–77)

## 2011-04-11 LAB — TRIGLYCERIDES: Triglycerides: 306 mg/dL — ABNORMAL HIGH (ref <150)

## 2011-04-11 LAB — GLUCOSE, CAPILLARY
Glucose-Capillary: 255 mg/dL — ABNORMAL HIGH (ref 70–99)
Glucose-Capillary: 265 mg/dL — ABNORMAL HIGH (ref 70–99)
Glucose-Capillary: 302 mg/dL — ABNORMAL HIGH (ref 70–99)

## 2011-04-11 LAB — HEPARIN LEVEL (UNFRACTIONATED): Heparin Unfractionated: 0.55 IU/mL (ref 0.30–0.70)

## 2011-04-11 LAB — PREALBUMIN: Prealbumin: 9 mg/dL — ABNORMAL LOW (ref 17.0–34.0)

## 2011-04-11 LAB — PROTIME-INR: INR: 1.33 (ref 0.00–1.49)

## 2011-04-11 LAB — MAGNESIUM: Magnesium: 2.1 mg/dL (ref 1.5–2.5)

## 2011-04-11 MED ORDER — INSULIN GLARGINE 100 UNIT/ML ~~LOC~~ SOLN
10.0000 [IU] | Freq: Every day | SUBCUTANEOUS | Status: DC
  Administered 2011-04-11: 10 [IU] via SUBCUTANEOUS

## 2011-04-11 MED ORDER — FAT EMULSION 20 % IV EMUL
168.0000 mL | INTRAVENOUS | Status: AC
  Administered 2011-04-11: 168 mL via INTRAVENOUS
  Filled 2011-04-11: qty 200

## 2011-04-11 MED ORDER — IPRATROPIUM BROMIDE HFA 17 MCG/ACT IN AERS
2.0000 | INHALATION_SPRAY | Freq: Two times a day (BID) | RESPIRATORY_TRACT | Status: DC
  Administered 2011-04-12 – 2011-04-19 (×15): 2 via RESPIRATORY_TRACT

## 2011-04-11 MED ORDER — ALBUTEROL SULFATE HFA 108 (90 BASE) MCG/ACT IN AERS
2.0000 | INHALATION_SPRAY | Freq: Two times a day (BID) | RESPIRATORY_TRACT | Status: DC
  Administered 2011-04-12 – 2011-04-19 (×15): 2 via RESPIRATORY_TRACT

## 2011-04-11 MED ORDER — MUPIROCIN 2 % EX OINT
1.0000 "application " | TOPICAL_OINTMENT | Freq: Two times a day (BID) | CUTANEOUS | Status: AC
  Administered 2011-04-11 – 2011-04-15 (×10): 1 via NASAL
  Filled 2011-04-11: qty 22

## 2011-04-11 MED ORDER — INSULIN ASPART 100 UNIT/ML ~~LOC~~ SOLN
0.0000 [IU] | SUBCUTANEOUS | Status: DC
  Administered 2011-04-11: 5 [IU] via SUBCUTANEOUS
  Administered 2011-04-11: 8 [IU] via SUBCUTANEOUS
  Administered 2011-04-11: 11 [IU] via SUBCUTANEOUS
  Administered 2011-04-12: 5 [IU] via SUBCUTANEOUS
  Administered 2011-04-12: 8 [IU] via SUBCUTANEOUS
  Administered 2011-04-12: 5 [IU] via SUBCUTANEOUS
  Administered 2011-04-12: 11 [IU] via SUBCUTANEOUS
  Administered 2011-04-12: 5 [IU] via SUBCUTANEOUS
  Administered 2011-04-12 – 2011-04-13 (×4): 3 [IU] via SUBCUTANEOUS
  Administered 2011-04-13: 8 [IU] via SUBCUTANEOUS
  Administered 2011-04-13: 3 [IU] via SUBCUTANEOUS
  Administered 2011-04-14: 2 [IU] via SUBCUTANEOUS
  Filled 2011-04-11: qty 3

## 2011-04-11 MED ORDER — INSULIN GLARGINE 100 UNIT/ML ~~LOC~~ SOLN
5.0000 [IU] | Freq: Every day | SUBCUTANEOUS | Status: DC
  Filled 2011-04-11: qty 3

## 2011-04-11 MED ORDER — TRACE MINERALS CR-CU-MN-SE-ZN 10-1000-500-60 MCG/ML IV SOLN
INTRAVENOUS | Status: AC
  Administered 2011-04-11: 18:00:00 via INTRAVENOUS
  Filled 2011-04-11: qty 2000

## 2011-04-11 MED ORDER — CHLORHEXIDINE GLUCONATE CLOTH 2 % EX PADS
6.0000 | MEDICATED_PAD | Freq: Every day | CUTANEOUS | Status: AC
  Administered 2011-04-11 – 2011-04-12 (×2): 6 via TOPICAL

## 2011-04-11 NOTE — Progress Notes (Signed)
ANTICOAGULATION CONSULT NOTE - Follow Up Consult  Pharmacy Consult for heparin Indication: atrial fibrillation  Allergies  Allergen Reactions  . Sulfonamide Derivatives     Childhood reaction    Patient Measurements: Height: 5\' 4"  (162.6 cm) Weight: 213 lb 10 oz (96.9 kg) IBW/kg (Calculated) : 54.7  Adjusted Body Weight: 76  Vital Signs: Temp: 97.4 F (36.3 C) (12/30 2000) Temp src: Oral (12/30 2000) BP: 120/47 mmHg (12/31 0000) Pulse Rate: 72  (12/31 0000)  Labs:  Basename 04/10/11 2207 04/10/11 0530 04/09/11 1130 04/09/11 0500 04/08/11 0430  HGB -- 8.7* -- 9.0* --  HCT -- 28.8* -- 30.2* 28.5*  PLT -- 298 -- 324 309  APTT -- -- -- -- --  LABPROT -- 21.1* 33.6* 38.9* --  INR -- 1.79* 3.24* 3.91* --  HEPARINUNFRC 0.18* -- -- -- --  CREATININE -- 1.84* -- 2.19* 2.52*  CKTOTAL -- -- -- -- --  CKMB -- -- -- -- --  TROPONINI -- -- -- -- --   Estimated Creatinine Clearance: 28.5 ml/min (by C-G formula based on Cr of 1.84).   Medications:  Infusions:    . sodium chloride 20 mL/hr at 04/10/11 2131  . heparin 1,400 Units/hr (04/10/11 2117)  . TPN (CLINIMIX) +/- additives    . TPN (CLINIMIX) +/- additives 40 mL/hr at 04/10/11 1833    Assessment: [name] with Afib and ongoing supertherapeutic INR after course of metronidazole, INR now <2, started on heparin for PICC placement, initial level is below goal (0.18), no bleeding complications noted. Will adjust rate and recheck level  Goal of Therapy:  Heparin level 0.3-0.7 units/ml   Plan:  Increase IV heparin to 1650 units/hr Recheck HL in 8 hours  [name] 04/11/2011,1:21 AM

## 2011-04-11 NOTE — Progress Notes (Signed)
Name: [name] MRN: [id number] DOB: 1930-11-17    LOS: 9  [facility]  History of Present Illness:  77 year old white female with multiple medical co-morbids: COPD w/ chronic resp failure (O2 dependant 2 liters), CRI w/ BL scr of ~2.55, MO, ANCA positive pauci-immune GN, prior ARDS required trach, CAF and prior perforation of jejunum. Presented on 12/22 with ~ 1-1.5 wk h/o abd pain, particularly over RLQ.Dx eval demonstrated: severe diverticulitis of jejunum. She was admitted to medical service. Rx has included IVFs, and empiric antibiotics. On 12/26 developed progressive hypoxia and antibiotic selection was widened to cover for possible HCAP.  On 7/27 she developed progressive MS change, ABG showed mixed respiratory and metabolic acidosis. She was placed on NIPPV, PCCM asked to eval.   Lines / Drains: L IJ CVC 12/27 >> R radial art line 12/27 >>12/30 oett 12/27>>>12/29  Cultures: 12/22 MRSA: positive 12/22 BCX2>>>neg 12/22 UC: e coli (>100K/pan sens) Lactate 12/27>>> 0.6 -> 0.7 Procalcitonin 12/27>>> 3.42 -> 2.26  Antibiotics: Cipro 12/22>>>12/26 Flagyl 12/22>>>12/26 Zosyn (sepsis, ? HCAP) 12/26>>> Vanc (sepsis ? HCAP) 12/26>>>12/30  Tests / Events: CT abd 12/26: 1. Overall findings suggestive of worsening acute diverticulitis of  the proximal jejunum with increase in the amount of fluid within the left paracolic gutter, about liver and extending into the  pelvis. There is a small fluid collection within the deep ventral  mesentery which is grossly unchanged in size, measuring 2.5 x 3.3  cm in diameter. Interval increase in amount of subcutaneous edema  about the left lateral abdominal wall. 2. Findings suggestive of early/partial small bowel obstruction about the level of acute diverticulitis.  3. Interval increase in size of bilateral pleural effusions and adjacent consolidative opacities, left greater than right, possibly atelectasis although  underlying infection is not excluded. 4. Cholelithiasis.   Subjective No distress. Very comfortable on SBT.  Vital Signs: Temp:  [97.4 F (36.3 C)-98 F (36.7 C)] 97.5 F (36.4 C) (12/31 0748) Pulse Rate:  [64-89] 89  (12/31 1036) Resp:  [15-26] 18  (12/31 1036) BP: (110-139)/(38-84) 115/84 mmHg (12/31 1036) SpO2:  [95 %-100 %] 96 % (12/31 1036) Weight:  [99 kg (218 lb 4.1 oz)] 218 lb 4.1 oz (99 kg) (12/31 0500) CVP:  7-9    . sodium chloride 20 mL/hr at 04/10/11 2131  . fat emulsion    . heparin 1,650 Units/hr (04/11/11 0123)  . TPN (CLINIMIX) +/- additives    . TPN (CLINIMIX) +/- additives 40 mL/hr at 04/10/11 1833  . TPN (CLINIMIX) +/- additives      Intake/Output Summary (Last 24 hours) at 04/11/11 1154 Last data filed at 04/11/11 1000  Gross per 24 hour  Intake 1662.68 ml  Output   1250 ml  Net 412.68 ml     Physical Examination: General:  Chronically ill appearing white female, no distress Neuro:  Baseline, has some generalized weakness Cardiovascular:  Regular irregular, AF on tele Lungs: scattered rhonchi, reduced bases Abdomen:  large, soft to palp. Tenderness improved daily, BS increased  Musculoskeletal:  No sig edema, brisk CR Skin:  Chronic venous stasis changes.   Labs and Imaging:   Lab 04/11/11 0400 04/10/11 0530 04/09/11 0500  NA 143 145 140  K 3.7 3.5 4.8  CL 106 106 105  CO2 27 26 23   BUN 44* 46* 56*  CREATININE 1.71* 1.84* 2.19*  GLUCOSE 269* 85 71    Lab 04/11/11 0400 04/10/11 0530 04/09/11 0500  HGB 8.4* 8.7* 9.0*  HCT 28.3* 28.8* 30.2*  WBC 24.5* 22.4* 22.1*  PLT 318 298 324   Lab Results  Component Value Date   INR 1.33 04/11/2011   INR 1.79* 04/10/2011   INR 3.24* 04/09/2011   ABG    Component Value Date/Time   PHART 7.376 04/10/2011 0440   PCO2ART 45.4* 04/10/2011 0440   PO2ART 66.1* 04/10/2011 0440   HCO3 26.2* 04/10/2011 0440   TCO2 27.6 04/10/2011 0440   ACIDBASEDEF 3.0* 04/08/2011 0431   O2SAT 92.8 04/10/2011  0440   PCXR: CM, bibasilar haziness , rotated, left effusion?  Assessment and Plan: Diverticulitis of jejunum with resultant SIRS/sepsis:  complicated by ileus. Surgery following. CT did show small fluid collection For repeat CT today abdo   Lab 04/11/11 0400 04/10/11 0530 04/09/11 0500  WBC 24.5* 22.4* 22.1*    Lab 04/10/11 0530 04/08/11 0430 04/07/11 0915  PROCALCITON 1.45 2.26 3.42  plan: -continue current abx/surgery rec cont conservative rx.  -per Surgery   Acute-on-chronic respiratory failure in setting of  COPD now acutely decompensating in the setting of hypoventilation d/t obesity, abd pain and Atx. Tolerated extubation well on 12/29. Plan: -wean FIO2 -I have reviewed lung bases from prior CT and will review the bases on the CT done today of abdomen Prior CT with ATX of LLL on left base and a small effusion on rt  Agree would concentrate on treating abdo distention and increasing mobilization and Aggressive IS She is on home O2 If any increase in distress would add pos pressure early CPAP No indication bronch for that Left base prior Pending my review of bases lungs on CT today  Atrial fibrillation,   Plan: -rate control -hep drip for fib until can retake coumadin  Coumadin induced coagulopathy Lab Results  Component Value Date   INR 1.33 04/11/2011   INR 1.79* 04/10/2011   INR 3.24* 04/09/2011  plan: -heparin gtt  DIASTOLIC HEART FAILURE, CHRONIC  Lab 04/08/11 0430  PROBNP 24310.0*  Plan: -keep even balance or allow neg balance as such seen with improved crt -BP controlled  Acute on chronic renal failure: baseline ~2.55-2.6 , tolerated lasix well on 12/29 Recent Labs  Basename 04/11/11 0400 04/10/11 0530 04/09/11 0500   CREATININE 1.71* 1.84* 2.19*  plan -avoid nephrotoxins -follow up chemistry Neg balance noted  DIABETES MELLITUS, TYPE II: excellent control  CBG (last 3)   Basename 04/11/11 0746 04/11/11 0402 04/11/11 0003  GLUCAP 265* 255*  206*  Plan: -ssi, escalate lantus to 10   Hyporkalemia:  Lab 04/11/11 0400 04/10/11 0530 04/09/11 0500  K 3.7 3.5 4.8  plan: -replace and recheck  -f/u bmp am  Gout flair Plan: - solumedrol to pred when able to take oral   Best practices / Disposition: -->tele given AF, triad to reassume.  -->full code -->Heparin for AF after INR <2.  -->Protonix for GI Px -->ventilator bundle -->diet: NPO, add TNA.  -->code status: full code. Does not want trach.   Patient seen and examined  Will follow pcxr and CT bases and make any further recs. Otherwise will sign off. Call if needed  Heidi Bucks., M.D.  Heidi Rossetti. Tyson Alias, MD, FACP Pgr: [phone] [facility]

## 2011-04-11 NOTE — Progress Notes (Signed)
Occupational Therapy Evaluation Patient Details Name: [name] MRN: [id number] DOB: Apr 26, 1930 Today's Date: 04/11/2011  Problem List:  Patient Active Problem List  Diagnoses  . HYPOTHYROIDISM  . DIABETES MELLITUS, TYPE II  . OVERWEIGHT/OBESITY  . OBSTRUCTIVE SLEEP APNEA  . HYPERTENSION, UNSPECIFIED  . Campath-induced atrial fibrillation  . DIASTOLIC HEART FAILURE, CHRONIC  . COPD  . RENAL FAILURE, CHRONIC  . SLEEP APNEA  . HYPERLIPIDEMIA-MIXED  . Fatigue  . Osteoarthritis  . Venous reflux  . Fever  . Bradycardia  . Diverticulitis large intestine  . Atelectasis  . Hyperkalemia    Past Medical History:  Past Medical History  Diagnosis Date  . Diabetes mellitus   . Obesity   . COPD (chronic obstructive pulmonary disease)     Moderate. PFTs (12/10): FVC 67%, FEV1 58%, ratio 60%, TLC 95%, RV 138%, DLCO 43% (moderate obstructive defect). She is on home oxygen that should be worn at all times.  . Diastolic CHF, chronic     Echo (3/12): EF 55-60%, mild LV hypertrophy, grade I diastolic dysfunction, mild left atrial enlargement, normal pulmonary artery pressure  . CKD (chronic kidney disease)     Cr 2.4 when last checked. 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 PNA (pneumonia)     h/o with ARDS in 2005; MRSA PNA with prolonged hospitalization in [facility] 11/11  . Diverticulum     Jejunal  . OSA (obstructive sleep apnea)     possible. When pt was sedated for TEE in May 2010, she did develop some upper airway obstruction  . Anemia     of chronic disease  . Hypothyroidism   . Gout   . Campath-induced atrial fibrillation 07/2008    Failed cardioversion with TEE guidance in May 2010. Pt went back to NSR by 11/2008 with amiodarne  . Hypertension   . GERD (gastroesophageal reflux disease)   . Peripheral vascular disease   . Pneumonia   . Chronic kidney disease    Past Surgical History:  Past Surgical History  Procedure Date  .  Tracheostomy     ARDS/ ICU admission, trach 2005  . Microperforation 08/2008    Of jejunem, ICU admission    OT Assessment/Plan/Recommendation OT Assessment Clinical Impression Statement: Pt. will benefit from skilled OT in acute setting to increase functional independence with ADLs and decrease burden of care at D/C. OT Recommendation/Assessment: Patient will need skilled OT in the acute care venue OT Problem List: Decreased strength;Decreased activity tolerance;Impaired balance (sitting and/or standing);Decreased safety awareness;Decreased knowledge of use of DME or AE Barriers to Discharge: None OT Therapy Diagnosis : Generalized weakness OT Plan OT Frequency: Min 2X/week OT Treatment/Interventions: Self-care/ADL training;Energy conservation;DME and/or AE instruction;Therapeutic activities;Patient/family education;Balance training OT Recommendation Recommendations for Other Services: Rehab consult Follow Up Recommendations: Inpatient Rehab Equipment Recommended: Defer to next venue Individuals Consulted Consulted and Agree with Results and Recommendations: Patient OT Goals Acute Rehab OT Goals OT Goal Formulation: With patient Time For Goal Achievement: 2 weeks ADL Goals Pt Will Perform Grooming: with min assist;with set-up;Standing at sink ADL Goal: Grooming - Progress: Progressing toward goals Pt Will Perform Lower Body Bathing: with min assist;Sit to stand from bed ADL Goal: Lower Body Bathing - Progress: Not met Pt Will Perform Lower Body Dressing: with min assist;Sit to stand from bed;Unsupported;with adaptive equipment ADL Goal: Lower Body Dressing - Progress: Not met Pt Will Transfer to Toilet: with mod assist;3-in-1;Stand pivot transfer ADL Goal: Toilet Transfer - Progress: Progressing toward goals  Pt Will Perform Toileting - Hygiene: with min assist;Sit to stand from 3-in-1/toilet ADL Goal: Toileting - Hygiene - Progress: Progressing toward goals  OT  Evaluation Precautions/Restrictions  Precautions Precautions: Fall Required Braces or Orthoses: No Restrictions Weight Bearing Restrictions: No Prior Functioning Home Living Lives With: Sheran Spine Help From: Family Type of Home: House Home Layout: One level Home Access: Stairs to enter Entrance Stairs-Rails: Right;Left;Can reach both Entrance Stairs-Number of Steps: 4 Bathroom Shower/Tub: Forensic scientist: Standard Bathroom Accessibility: Yes How Accessible: Accessible via walker Home Adaptive Equipment: Dan Humphreys - four wheeled;Wheelchair - manual;Tub transfer bench Prior Function Level of Independence: Independent with basic ADLs;Independent with transfers;Requires assistive device for independence;Needs assistance with homemaking Able to Take Stairs?: Yes Driving: No Vocation: Retired Comments: plans to go home with daughter; house, one level with no steps to enter, will have 24 hour assist at home ADL ADL Eating/Feeding: Simulated;Set up Where Assessed - Eating/Feeding: Edge of bed Grooming: Performed;Brushing hair;Minimal assistance Grooming Details (indicate cue type and reason): Assist for thoroughness due to pt. relying on UE support to maintain sitting balance Where Assessed - Grooming: Sitting, bed Upper Body Bathing: Simulated;Chest;Right arm;Left arm;Abdomen;Moderate assistance Where Assessed - Upper Body Bathing: Sitting, bed Lower Body Bathing: Simulated;+1 Total assistance Where Assessed - Lower Body Bathing: Sit to stand from bed Upper Body Dressing: Performed;Minimal assistance Upper Body Dressing Details (indicate cue type and reason): with donning gown Where Assessed - Upper Body Dressing: Sitting, bed Lower Body Dressing: Performed;+1 Total assistance Lower Body Dressing Details (indicate cue type and reason): With donning bilateral socks. Pt. able to bend knee in attempts to cross foot over opposite knee, but due to abdomen pt.  unable to complete. Where Assessed - Lower Body Dressing: Supine, head of bed up Toilet Transfer: Simulated;+2 Total assistance;Comment for patient % (pt=40%) Toilet Transfer Details (indicate cue type and reason): Sit to stand and side step towards head of bed and max verbal cues for hand placement and technique. Toilet Transfer Method: Other (comment) (sit-stand) Toileting - Clothing Manipulation: Simulated;Maximal assistance Where Assessed - Glass blower/designer Manipulation: Standing Toileting - Hygiene: Performed;+1 Total assistance Toileting - Hygiene Details (indicate cue type and reason): assist for thoroughness for completing hygiene of backside Where Assessed - Toileting Hygiene: Standing Tub/Shower Transfer: Not assessed Tub/Shower Transfer Method: Not assessed Equipment Used: Rolling walker ADL Comments: Pt. awaiting procedure and with need to return to bed after session. Pt with deconditioning and requiring assist for mobility and ADLs. Vision/Perception  Vision - History Baseline Vision: No visual deficits Patient Visual Report: No change from baseline Vision - Assessment Eye Alignment: Within Functional Limits Vision Assessment: Vision not tested Cognition Cognition Arousal/Alertness: Awake/alert Overall Cognitive Status: Appears within functional limits for tasks assessed Orientation Level: Oriented X4 Sensation/Coordination Sensation Light Touch: Appears Intact Stereognosis: Not tested Hot/Cold: Not tested Proprioception: Not tested Extremity Assessment RUE Assessment RUE Assessment:  (defer to OT) LUE Assessment LUE Assessment:  (defer to OT) Mobility  Bed Mobility Bed Mobility: Yes Rolling Right: 2: Max assist Rolling Right Details (indicate cue type and reason): use of pad to assist and initiate rolling  Right Sidelying to Sit: 2: Max assist;With rails Supine to Sit: 2: Max assist Supine to Sit Details (indicate cue type and reason): max for sequencing  and follow through Sitting - Scoot to Edge of Bed: 3: Mod assist Sitting - Scoot to Edge of Bed Details (indicate cue type and reason): modA to sequence Sit to Supine - Left: 1: +2 Total assist Sit  to Supine - Left Details (indicate cue type and reason): +3totalpt30-40% with assist to elevate legs and reposition; once in bed pt having difficulty shifting hips needing facilitation with pad to get her better positioned Scooting to [facility]: 1: +2 Total assist Scooting to [facility] Details (indicate cue type and reason): +2totalpt10% with use of pad to scoot Transfers Transfers: Yes Sit to Stand: 1: +2 Total assist;With upper extremity assist;From elevated surface;From bed Sit to Stand Details (indicate cue type and reason): pt stood x2 with +2totalpt40-45% on the first trial needing sequencing cues and significant facilitation for hip/trunk extension; the second time pt stood she needed more sequencing cues as pt more segmental with hands/trunk vs fluidity; cueing for anterior translation and increased facilitatory cues for follow through trunk extension pt performing 35-40% seond stand Stand to Sit: 3: Mod assist Exercises Total Joint Exercises Ankle Circles/Pumps: AROM;5 reps;Supine End of Session OT - End of Session Equipment Utilized During Treatment: Gait belt Activity Tolerance: Patient tolerated treatment well Patient left: in bed;with call bell in reach Nurse Communication: Mobility status for transfers General Behavior During Session: [facility] for tasks performed Cognition: [facility] for tasks performed  Co-treat with Flora Lipps, OTR/L Pager [phone] 04/11/2011, 10:47 AM

## 2011-04-11 NOTE — Progress Notes (Signed)
PARENTERAL NUTRITION CONSULT NOTE - FOLLOW UP  Pharmacy Consult for TPN Indication: prolonged ileus  Patient Measurements: Height: 5\' 4"  (162.6 cm) Weight: 218 lb 4.1 oz (99 kg) IBW/kg (Calculated) : 54.7   Vital Signs: Temp: 97.5 F (36.4 C) (12/31 0748) Temp src: Oral (12/31 0748) BP: 123/45 mmHg (12/31 0400) Pulse Rate: 72  (12/31 0400)  Intake/Output from previous day: 12/30 0701 - 12/31 0700 In: 1410.7 [I.V.:575.2; IV Piggyback:337.5; TPN:498] Out: 1350 [Urine:1350]  Labs:  [facility] 04/11/11 0400 04/10/11 0530 04/09/11 1130 04/09/11 0500  WBC 24.5* 22.4* -- 22.1*  HGB 8.4* 8.7* -- 9.0*  HCT 28.3* 28.8* -- 30.2*  PLT 318 298 -- 324  APTT -- -- -- --  INR -- 1.79* 3.24* 3.91*     Basename 04/11/11 0400 04/10/11 0530 04/09/11 0500  NA 143 145 140  K 3.7 3.5 4.8  CL 106 106 105  CO2 27 26 23   GLUCOSE 269* 85 71  BUN 44* 46* 56*  CREATININE 1.71* 1.84* 2.19*  LABCREA -- -- --  CREAT24HRUR -- -- --  CALCIUM 8.4 8.5 8.2*  MG 2.1 2.0 2.3  PHOS 3.5 3.2 3.2  PROT 5.3* 5.1* --  ALBUMIN 1.8* 1.6* --  AST 10 16 --  ALT 10 10 --  ALKPHOS 54 60 --  BILITOT 0.2* 0.3 --  BILIDIR -- -- --  IBILI -- -- --  PREALBUMIN -- 8.0* --  TRIG 306* 293* --  CHOLHDL -- -- --  CHOL 107 91 --   Estimated Creatinine Clearance: 31 ml/min (by C-G formula based on Cr of 1.71).    Basename 04/11/11 0402 04/11/11 0003 04/10/11 2000  GLUCAP 255* 206* 160*   Insulin Requirements in the past 24 hours:  10 units of sliding scale CBGs 160-269 - not at goal   Current Nutrition:  TPN Clinimix 5/15 at 79ml/hr  Assessment: Diverticulitis with abscess and ileus. Permissive underfeeding Kcal goals per ASPEN guidelines and RD recommendations. TPN started last night and since initiation CBGs have been uncontrolled. Noted that pt does have a history of DM and is also on methylprednisolone. Most recent prealbumin was 8. Triglycerides were elevated today at 306. However, will not hold lipids  since <400. Will start lipids today and assess effects on triglycerides (also will only be getting lipids on MWF d/t national backorder). Remains off propofol therapy. Electrolytes are WNL and actually increased slightly even though there was no electrolytes in her TPN. Will continue to give electrolyte-free TPN since lytes are stable and Scr is elevated. May need to add back lytes if renal function continues to improve.   Nutritional Goals:  1017-1187 kCal, 75-85 grams of protein per day (premix TPN will not allow [place] to meet her protein goals)  Plan:  1. Increase clinimix 5/15 to goal rate of 45ml/hr + lipids at 72ml/hr. (Provides 1273 Kcal with lipids + 66 gm protein) 2. Check BMET, Mg, + Phos in AM 3. Add 10 units of insulin to TPN bag - f/u CBG control 4. F/u triglycerides on Wed to assess effect from lipid supplementation  Hien Perreira, Drake Leach 04/11/2011,8:03 AM

## 2011-04-11 NOTE — Progress Notes (Signed)
[facility] Progress Note    Subjective: The patient is awake, alert, and oriented.  Has minimal to no abdominal pain.  Objective: Vital signs in last 24 hours: Temp:  [97.4 F (36.3 C)-98 F (36.7 C)] 97.5 F (36.4 C) (12/31 0748) Pulse Rate:  [67-79] 72  (12/31 0400) Resp:  [15-26] 26  (12/31 0400) BP: (108-128)/(38-59) 123/45 mmHg (12/31 0400) SpO2:  [95 %-100 %] 95 % (12/31 0400) Weight:  [218 lb 4.1 oz (99 kg)] 218 lb 4.1 oz (99 kg) (12/31 0500) Last BM Date: 04/10/11  Intake/Output from previous day: 12/30 0701 - 12/31 0700 In: 1410.7 [I.V.:575.2; IV Piggyback:337.5; TPN:498] Out: 1350 [Urine:1350] Intake/Output this shift:    General: No acute distress.  Good spirits  Lungs: Decreased breath sounds bilaterally, L>R.  No wheezes rales, or rhonchi.  Abd: Soft with some mild discomfort to palpation in the LLQ.  Had a large BM yesterday, smaller one this AM.  Extremities: Less edematous.  Neuro: Intact  Lab Results:  @LABLAST2 (wbc:2,hgb:2,hct:2,plt:2) BMET  Basename 04/11/11 0400 04/10/11 0530  NA 143 145  K 3.7 3.5  CL 106 106  CO2 27 26  GLUCOSE 269* 85  BUN 44* 46*  CREATININE 1.71* 1.84*  CALCIUM 8.4 8.5   PT/INR  Basename 04/10/11 0530 04/09/11 1130  LABPROT 21.1* 33.6*  INR 1.79* 3.24*   ABG  Basename 04/10/11 0440 04/09/11 1536  PHART 7.376 7.375  HCO3 26.2* 25.4*    Studies/Results: Dg Chest Port 1 View  04/10/2011  *RADIOLOGY REPORT*  Clinical Data: Bedside central venous catheter placement.  PORTABLE CHEST - 1 VIEW 04/10/2011 1736 hours:  Comparison: Portable chest x-ray earlier same date 0626 hours.  Findings: New right jugular central venous catheter tip in the upper SVC.  No evidence of pneumothorax mediastinal hematoma.  Left jugular venous catheter tip remains at the junction of the IJ and innominate vein.  Nasogastric tube courses below the diaphragm into the stomach.  Abrupt occlusion of the left lower lobe bronchus, with dense  consolidation in the left lower lobe.  Interval increase in size of bilateral pleural effusion since earlier today. Worsening pulmonary venous hypertension and perhaps mild interstitial pulmonary edema currently.  IMPRESSION:  1.  New right jugular central venous catheter tip in the upper SVC. No acute complicating features. 2.  Remaining support apparatus satisfactory. 3.  Occluded left lower lobe bronchus, query mucus plugging. Associated dense left lower lobe atelectasis and/or pneumonia. 4.  Developing pulmonary venous hypertension and mild interstitial pulmonary edema, query fluid overload. 5.  Enlarging bilateral pleural effusions.  Original Report Authenticated By: Arnell Sieving, M.D.   Dg Chest Port 1 View  04/10/2011  *RADIOLOGY REPORT*  Clinical Data: Removed endotracheal tube  PORTABLE CHEST - 1 VIEW  Comparison: 04/09/2011  Findings: Cardiomegaly again noted.  Stable NG tube position. Endotracheal tube has been removed.  There is no diagnostic pneumothorax.  Bilateral small to moderate pleural effusion with bilateral basilar atelectasis or infiltrate.  IMPRESSION:  Stable NG tube position.  Endotracheal tube has been removed. There is no diagnostic pneumothorax.  Bilateral small to moderate pleural effusion with bilateral basilar atelectasis or infiltrate.  Original Report Authenticated By: Natasha Mead, M.D.   Dg Abd Portable 1v  04/09/2011  *RADIOLOGY REPORT*  Clinical Data: Follow-up ileus.  ABDOMEN - 1 VIEW  Comparison: 04/07/2011  Findings: A single view of the abdomen demonstrates gas within nondilated colonic loops.  There is contrast within the rectosigmoid colon following previous CT exam on 04/06/2011.  No dilated loops are identified.  There is degenerative change in the lumbar spine.  IMPRESSION: No evidence for bowel obstruction.  Original Report Authenticated By: Patterson Hammersmith, M.D.    Anti-infectives: Anti-infectives     Start     Dose/Rate Route Frequency Ordered Stop     04/10/11 1400  piperacillin-tazobactam (ZOSYN) IVPB 3.375 g       3.375 g 12.5 mL/hr over 240 Minutes Intravenous 3 times per day 04/10/11 1135     04/07/11 1200   vancomycin (VANCOCIN) IVPB 1000 mg/200 mL premix  Status:  Discontinued        1,000 mg 200 mL/hr over 60 Minutes Intravenous Every 24 hours 04/06/11 0941 04/10/11 1152   04/06/11 1200   vancomycin (VANCOCIN) 1,500 mg in sodium chloride 0.9 % 500 mL IVPB        1,500 mg 250 mL/hr over 120 Minutes Intravenous  Once 04/06/11 0941 04/06/11 1343   04/06/11 1200   piperacillin-tazobactam (ZOSYN) IVPB 2.25 g  Status:  Discontinued        2.25 g 100 mL/hr over 30 Minutes Intravenous 4 times per day 04/06/11 0941 04/10/11 1135   04/04/11 0000   ciprofloxacin (CIPRO) IVPB 400 mg  Status:  Discontinued        400 mg 200 mL/hr over 60 Minutes Intravenous Every 24 hours 04/03/11 0036 04/06/11 0847   04/03/11 0045   ciprofloxacin (CIPRO) IVPB 400 mg        400 mg 200 mL/hr over 60 Minutes Intravenous NOW 04/03/11 0036 04/03/11 0145   04/02/11 2359   metroNIDAZOLE (FLAGYL) IVPB 500 mg  Status:  Discontinued        500 mg 100 mL/hr over 60 Minutes Intravenous Every 8 hours 04/02/11 2328 04/06/11 0847   04/02/11 2115   cefTRIAXone (ROCEPHIN) 1 g in dextrose 5 % 50 mL IVPB        1 g 100 mL/hr over 30 Minutes Intravenous  Once 04/02/11 2110 04/02/11 2314          Assessment/Plan: s/p  WBC is increasing but the amount of abdominal pain does not appear to be worsening.  No fever. Last CT scan of the abdomen was 5 days ago,  No rule that you have to wait one week before doing another one. Renal insufficiency is slowly resolving.  Making adequate urine. No urgent need for surgical intervention, but will go ahead and repeat her CT with oral contrast only.   LOS: 9 days   Marta Lamas. Gae Bon, MD, FACS [phone] [facility] 04/11/2011

## 2011-04-11 NOTE — Progress Notes (Addendum)
ANTICOAGULATION & ANTIBIOTIC CONSULT NOTE - Follow Up Consult  Pharmacy Consult for heparin/Zosyn Indication: atrial fibrillation  Allergies  Allergen Reactions  . Sulfonamide Derivatives     Childhood reaction    Patient Measurements: Height: 5\' 4"  (162.6 cm) Weight: 218 lb 4.1 oz (99 kg) IBW/kg (Calculated) : 54.7  Adjusted Body Weight: 76  Vital Signs: Temp: 97.5 F (36.4 C) (12/31 0748) Temp src: Oral (12/31 0748) BP: 115/84 mmHg (12/31 1036) Pulse Rate: 89  (12/31 1036)  Labs:  Basename 04/11/11 0900 04/11/11 0400 04/10/11 2207 04/10/11 0530 04/09/11 1130 04/09/11 0500  HGB -- 8.4* -- 8.7* -- --  HCT -- 28.3* -- 28.8* -- 30.2*  PLT -- 318 -- 298 -- 324  APTT -- -- -- -- -- --  LABPROT 16.7* -- -- 21.1* 33.6* --  INR 1.33 -- -- 1.79* 3.24* --  HEPARINUNFRC 0.55 -- 0.18* -- -- --  CREATININE -- 1.71* -- 1.84* -- 2.19*  CKTOTAL -- -- -- -- -- --  CKMB -- -- -- -- -- --  TROPONINI -- -- -- -- -- --   Estimated Creatinine Clearance: 31 ml/min (by C-G formula based on Cr of 1.71).   Medications:  Infusions:     . sodium chloride 20 mL/hr at 04/10/11 2131  . fat emulsion    . heparin 1,650 Units/hr (04/11/11 0123)  . TPN (CLINIMIX) +/- additives    . TPN (CLINIMIX) +/- additives 40 mL/hr at 04/10/11 1833  . TPN (CLINIMIX) +/- additives      Assessment: [name] with Afib and ongoing supertherapeutic INR after course of metronidazole, INR now <2, started on heparin for PICC placement, initial level is below goal (0.18), no bleeding complications noted. Will adjust rate and recheck level  Pharmacy Problem List/Assessment:  Anticoagulation: Afib (Coumadin PTA) on IV heparin, HL =0.55 on 1650 units/hr (16.77ml/h). Hgb 8.4 (decr), Plts 318-stable, no bleeding reported. INR 1.33 today, plan to continue holding coumadin until can take orals Infectious disease: wbc up at 24.5 yesterday, blood cx-neg, 12/22 Ecoli UTI-pan sensitive. On Zosyn d6 for abdominal distenstion.  Dose appropriate. Vanc stopped 12/30.  Neuro:   Pulm:  Atelectasis L-lung, Effusion on R, on Flovent, Atrovent, Albuterol, SoluMedrol 40 daily Card: BP 115/84, HR 89,  FEN/GI/Endo: TPN; CBGs 250-265 on Lantus 5 and mod SSI-- increasing lantus today. S/p CT abd today Renal: SCr improving, CrCl ~31, UOP ~0.7 cc/kg/hr.  Heme/Onc: Hgb down at 8.4, plts stable, no bleeding reported Best practices: PPI, IV heparin   Goal of Therapy:  Heparin level 0.3-0.7 units/ml   Plan:  Continue IV heparin to 1650 units/hr Recheck HL in 8 hours to confirm Continue Zosyn at 3.375g IV q8h.   Fayne Norrie 04/11/2011,11:56 AM

## 2011-04-11 NOTE — Progress Notes (Signed)
Subjective: 77 y/o admitted with abdominal pain and distension and noted to be bradycardic down to 40s on admission.  She has had diverticulitis in the past and states that this episode is similar to her last. By 04/06/2011 she developed progressive hypoxia and antibiotics were broadened to cover for possible healthcare acquired pneumonia. Due to persistent abdominal pain a CT scan of the abdomen was obtained and question evolving jejunal abscesses in association with acute diverticulitis. By 04/07/2011 she developed progressive mental status changes an ABG demonstrated mixed respiratory and metabolic acidosis that was not responsive to noninvasive positive ventilation. Care was eventually assumed by pulmonary critical care medicine and surgery was consulted. She was eventually intubated for airway protection. It was also felt a degree of her respiratory failure was due to mechanical issues do to significant abdominal distention therefore an NG tube was placed because of her partial small bowel obstruction.  Today we have assumed care of this patient. Her mental status has greatly improved as compared to 04/07/2011 and her abdomen although distended is much less than previously is soft and minimally tender in the right mid abdomen. She denies chest pain or shortness of breath, coughing. She is passing what appears to be residual stool but denies any passage of flatus.    Objective: Blood pressure 139/84, pulse 72, temperature 97.5 F (36.4 C), temperature source Oral, resp. rate 20, height 5\' 4"  (1.626 m), weight 99 kg (218 lb 4.1 oz), SpO2 96.00%. Weight change: 2.1 kg (4 lb 10.1 oz)  Intake/Output Summary (Last 24 hours) at 04/11/11 1025 Last data filed at 04/11/11 1000  Gross per 24 hour  Intake 1882.68 ml  Output   1600 ml  Net 282.68 ml   Physical Exam: General appearance: Pale but alert, cooperative and mild distress, despite answering orientation questions appropriately her overall affect  appears disoriented and flat. Head: Normocephalic, without obvious abnormality, atraumatic   Lungs: Coarse to auscultation bilaterally, 4 L nasal cannula, respiratory effort shallow with diminished sounds in the bases, no tachypnea Heart: regular rate and rhythm, S1, S2 normal, no murmur, click, rub or gallop, IV fluids at keep open, IV heparin infusing Abdomen: distended, tympanic, soft and diffusely tender with minimal guarding but no rebounding. Bowel sounds are quite diminished, NG tube to low suction with green bilious returns noted, has parenteral nutrition infusing Extremities: extremities normal, atraumatic, no cyanosis or edema   Lab Results:  [facility] 04/11/11 0400 04/10/11 0530  NA 143 145  K 3.7 3.5  CL 106 106  CO2 27 26  GLUCOSE 269* 85  BUN 44* 46*  CREATININE 1.71* 1.84*  CALCIUM 8.4 8.5  MG 2.1 2.0  PHOS 3.5 3.2    Basename 04/11/11 0400 04/10/11 0530  WBC 24.5* 22.4*  NEUTROABS 23.3* 20.9*  HGB 8.4* 8.7*  HCT 28.3* 28.8*  MCV 89.3 89.7  PLT 318 298   Micro Results: Recent Results (from the past 240 hour(s))  CULTURE, BLOOD (ROUTINE X 2)     Status: Normal   Collection Time   04/02/11 10:21 PM      Component Value Range Status Comment   Specimen Description BLOOD RIGHT HAND   Final    Special Requests BOTTLES DRAWN AEROBIC AND ANAEROBIC 10CC EA   Final    Setup Time [id number]   Final    Culture NO GROWTH 5 DAYS   Final    Report Status 04/09/2011 FINAL   Final   CULTURE, BLOOD (ROUTINE X 2)     Status:  Normal   Collection Time   04/02/11 10:29 PM      Component Value Range Status Comment   Specimen Description BLOOD LEFT FOREARM   Final    Special Requests BOTTLES DRAWN AEROBIC ONLY 10CC   Final    Setup Time [id number]   Final    Culture NO GROWTH 5 DAYS   Final    Report Status 04/09/2011 FINAL   Final   MRSA PCR SCREENING     Status: Abnormal   Collection Time   04/02/11 11:37 PM      Component Value Range Status Comment   MRSA by PCR  POSITIVE (*) NEGATIVE  Final   URINE CULTURE     Status: Normal   Collection Time   04/02/11 11:58 PM      Component Value Range Status Comment   Specimen Description URINE, CATHETERIZED   Final    Special Requests     Final    Value: ADDED ON 04/03/11 AT 0053 PATIENT ON FOLLOWING ROCPEHIN   Setup Time [id number]   Final    Colony Count >=100,000 COLONIES/ML   Final    Culture ESCHERICHIA COLI   Final    Report Status 04/05/2011 FINAL   Final    Organism ID, Bacteria ESCHERICHIA COLI   Final   CULTURE, BLOOD (ROUTINE X 2)     Status: Normal (Preliminary result)   Collection Time   04/07/11  6:00 PM      Component Value Range Status Comment   Specimen Description BLOOD A-LINE   Final    Special Requests BOTTLES DRAWN AEROBIC AND ANAEROBIC 10CC   Final    Setup Time [id number]   Final    Culture     Final    Value:        BLOOD CULTURE RECEIVED NO GROWTH TO DATE CULTURE WILL BE HELD FOR 5 DAYS BEFORE ISSUING A FINAL NEGATIVE REPORT   Report Status PENDING   Incomplete   CULTURE, BLOOD (ROUTINE X 2)     Status: Normal (Preliminary result)   Collection Time   04/07/11  9:30 PM      Component Value Range Status Comment   Specimen Description BLOOD A-LINE DRAW   Final    Special Requests BOTTLES DRAWN AEROBIC AND ANAEROBIC 10CC   Final    Setup Time [id number]   Final    Culture     Final    Value:        BLOOD CULTURE RECEIVED NO GROWTH TO DATE CULTURE WILL BE HELD FOR 5 DAYS BEFORE ISSUING A FINAL NEGATIVE REPORT   Report Status PENDING   Incomplete    Assessment/Plan:  Acute hypoxemic and hypercarbic respiratory failure/question healthcare acquired pneumonia Markedly improved. He is still requiring higher doses of oxygen and was utilizing at home but is in no acute distress. Last chest x-ray 04/10/2011 shows occluded left lower lobe bronchus and a question of mucous plugging with associated dense left lower lobe atelectasis and/or pneumonia as well as developing pulmonary  venous hypertension and mild interstitial pulmonary edema and enlarging bilateral pleural effusions. We discussed this with Dr. Tyson Alias who is with care medicine and he will continue to assist [place] in regards to management of this problem. We will begin mobilization and continue supportive care with oxygen and nebs.  Acute diffuse Diverticlulitis of the jejunum  There was no evidence of perforation or abscess formation on the initial CT scan of the abdomen performed 04/03/2011. Repeat CT scan performed  04/06/2011 shows ventral mesenteric stranding with a 2.5 x 2.3 cm fluid collection within the deep ventral mesentery. The radiologist also favors that despite prior report saying there was no fluid collections or abscess formations he discerned a 1.9 x 2.6 cm fluid collection on the initial CT in the same location. There is no pneumoperitoneum. Her pro calcitonin has been elevated but is tending downward. Surgery continues to follow. No evidence of acute abdomen. Repeat CT scan of the abdomen and pelvis with oral contrast only is pending for today.  Metabolic encephalopathy RESOLVED  Partial small bowel obstruction Continue NG tube to low suction as well as parenteral nutrition. Pre-albumin level is pending.  Atrial fibrillation with Bradycardia  The patient's bradycardia was likely due to a medication response possibly worsened due to high vagal tone in the setting of persistent abdominal pain and nausea-her heart rate has improved to 60's and 70s upon holding her rate controlling medications. Currently due to n.p.o. status her normal antihypertensive medications as well as rate control medications have been on hold. Currently no evidence of rapid ventricular response or recurrent bradycardia.  Hypotension  RESOLVED  Acute renal failure/ hyperkalemia  Marked improvement in renal function/creatinine. Potassium is normalized and creatinine is stable at 1.71 consistent with the patient's  baseline.  RENAL FAILURE, CHRONIC stage 4/ANCA+ pauci-immune glomerulonephritis  Baseline creatinine is reported to be 2.4  DIABETES MELLITUS, TYPE II /Hyperglycemia CBGs have been elevated greater than 200 in combination with apparent cure nutrition as well as IV steroids. We will continue sliding scale insulin but increase to a moderate scale and add low-dose Lantus and follow. If not done yet this admission we will check a hemoglobin A1c.  DIASTOLIC HEART FAILURE, CHRONIC/small bilateral pleural effusions  Repeat CXR 04/05/2011 was concerning for pulmonary edema therefore Lasix was resumed 40 mg IV every 12 hours and IV fluids were discontinued. She remained in a positive fluid balance after that. She continues with what appears to be evolving effusions. Lasix remains on hold but will consider resuming, defer to pulmonary medicine recommendations.  Chronic hypoxemic respiratory failure secondary to COPD  on home O2 continuously at 2 L per minute-diminished inspiratory effort related to abdominal pain is currently felt to be contributing to persistent hypoxia and increased oxygen needs which are stable at this point. We'll continue incentive spirometry and begin mobilization. PT and OT have been ordered.  Disposition Physiologically stable to transfer to the surgical floor/5100 as was recommended by critical care medicine on 04/10/2011    LOS: 9 days   ELLIS,ALLISON L. [phone] 04/11/2011, 10:25 AM

## 2011-04-11 NOTE — Progress Notes (Signed)
I have examined the patient and reviewed the chart. Abdomen is still distended but less than before. Not much bowel movement heard yet therefore will keep NG tube for now. Transferring to med/surg bed and increasing mobility today.   Calvert Cantor MD [phone]

## 2011-04-11 NOTE — Progress Notes (Signed)
ANTICOAGULATION & ANTIBIOTIC CONSULT NOTE - Follow Up Consult  Pharmacy Consult for heparin/Zosyn Indication: atrial fibrillation  Allergies  Allergen Reactions  . Sulfonamide Derivatives     Childhood reaction    Patient Measurements: Height: 5\' 4"  (162.6 cm) Weight: 218 lb 4.1 oz (99 kg) IBW/kg (Calculated) : 54.7  Adjusted Body Weight: 76  Assessment: [name] with Afib and ongoing supertherapeutic INR after course of metronidazole, INR now <2, started on heparin for PICC placement, level reported as 0.46 on 1650/hr nobleeding complications noted.  Goal of Therapy:  Heparin level 0.3-0.7 units/ml   Plan:  Continue IV heparin to 1650 units/hr  Vital Signs: Temp: 97.4 F (36.3 C) (12/31 1217) Temp src: Axillary (12/31 1217) BP: 133/50 mmHg (12/31 1400) Pulse Rate: 67  (12/31 1400)  Labs:  Basename 04/11/11 1730 04/11/11 0900 04/11/11 0400 04/10/11 2207 04/10/11 0530 04/09/11 1130 04/09/11 0500  HGB -- -- 8.4* -- 8.7* -- --  HCT -- -- 28.3* -- 28.8* -- 30.2*  PLT -- -- 318 -- 298 -- 324  APTT -- -- -- -- -- -- --  LABPROT -- 16.7* -- -- 21.1* 33.6* --  INR -- 1.33 -- -- 1.79* 3.24* --  HEPARINUNFRC 0.46 0.55 -- 0.18* -- -- --  CREATININE -- -- 1.71* -- 1.84* -- 2.19*  CKTOTAL -- -- -- -- -- -- --  CKMB -- -- -- -- -- -- --  TROPONINI -- -- -- -- -- -- --   Estimated Creatinine Clearance: 31 ml/min (by C-G formula based on Cr of 1.71).   Medications:  Infusions:     . sodium chloride 20 mL/hr at 04/10/11 2131  . fat emulsion 168 mL (04/11/11 1745)  . heparin 1,650 Units/hr (04/11/11 1500)  . TPN (CLINIMIX) +/- additives 40 mL/hr at 04/10/11 1833  . TPN [facility]) +/- additives 55 mL/hr at 04/11/11 1745    [name] [name] 04/11/2011,7:43 PM

## 2011-04-11 NOTE — Progress Notes (Signed)
Physical Therapy Evaluation Patient Details Name: [name] MRN: [id number] DOB: 03/24/1931 Today's Date: 04/11/2011  Problem List:  Patient Active Problem List  Diagnoses  . HYPOTHYROIDISM  . DIABETES MELLITUS, TYPE II  . OVERWEIGHT/OBESITY  . OBSTRUCTIVE SLEEP APNEA  . HYPERTENSION, UNSPECIFIED  . Campath-induced atrial fibrillation  . DIASTOLIC HEART FAILURE, CHRONIC  . COPD  . RENAL FAILURE, CHRONIC  . SLEEP APNEA  . HYPERLIPIDEMIA-MIXED  . Fatigue  . Osteoarthritis  . Venous reflux  . Fever  . Bradycardia  . Diverticulitis large intestine  . Atelectasis  . Hyperkalemia    Past Medical History:  Past Medical History  Diagnosis Date  . Diabetes mellitus   . Obesity   . COPD (chronic obstructive pulmonary disease)     Moderate. PFTs (12/10): FVC 67%, FEV1 58%, ratio 60%, TLC 95%, RV 138%, DLCO 43% (moderate obstructive defect). She is on home oxygen that should be worn at all times.  . Diastolic CHF, chronic     Echo (3/12): EF 55-60%, mild LV hypertrophy, grade I diastolic dysfunction, mild left atrial enlargement, normal pulmonary artery pressure  . CKD (chronic kidney disease)     Cr 2.4 when last checked. 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 PNA (pneumonia)     h/o with ARDS in 2005; MRSA PNA with prolonged hospitalization in [facility] regional 11/11  . Diverticulum     Jejunal  . OSA (obstructive sleep apnea)     possible. When pt was sedated for TEE in May 2010, she did develop some upper airway obstruction  . Anemia     of chronic disease  . Hypothyroidism   . Gout   . Campath-induced atrial fibrillation 07/2008    Failed cardioversion with TEE guidance in May 2010. Pt went back to NSR by 11/2008 with amiodarne  . Hypertension   . GERD (gastroesophageal reflux disease)   . Peripheral vascular disease   . Pneumonia   . Chronic kidney disease    Past Surgical History:  Past Surgical History  Procedure Date  .  Tracheostomy     ARDS/ ICU admission, trach 2005  . Microperforation 08/2008    Of jejunem, ICU admission    PT Assessment/Plan/Recommendation PT Assessment Clinical Impression Statement: Pt a 77 y/o female admitted for L sided abdominal pain. Was being followed by PT but because of medical decline and intubation PT was discharged on 04/08/11. PT reordered 04/10/11 and is now being seen for PT eval. Upon eval pt demonstrates generalized weakness and decreased activity tolerance secondary to extended immobilization and bed rest during stay here in hospital. Will benefit PT in the acute setting for these and the following problem list to maximize independence and facilitate a faster d/c home. Would also benefit from a CIR consult for continued therapies prior to pt d/c home.  PT Recommendation/Assessment: Patient will need skilled PT in the acute care venue PT Problem List: Decreased strength;Decreased range of motion;Decreased activity tolerance;Decreased balance;Decreased mobility;Decreased knowledge of use of DME;Obesity PT Recommendation Recommendations for Other Services: Rehab consult Follow Up Recommendations: Inpatient Rehab Equipment Recommended: Defer to next venue PT Goals  Acute Rehab PT Goals PT Goal Formulation: With patient Time For Goal Achievement: 7 days Pt will Roll Supine to Right Side: with supervision PT Goal: Rolling Supine to Right Side - Progress: Progressing toward goal Pt will Roll Supine to Left Side: with supervision PT Goal: Rolling Supine to Left Side - Progress: Progressing toward goal Pt will go Supine/Side  to Sit: with supervision PT Goal: Supine/Side to Sit - Progress: Progressing toward goal Pt will go Sit to Supine/Side: with supervision PT Goal: Sit to Supine/Side - Progress: Progressing toward goal Pt will go Sit to Stand: with supervision PT Goal: Sit to Stand - Progress: Progressing toward goal Pt will go Stand to Sit: with supervision PT Goal:  Stand to Sit - Progress: Progressing toward goal Pt will Ambulate: 51 - 150 feet;with least restrictive assistive device;with supervision PT Goal: Ambulate - Progress: Progressing toward goal  PT Evaluation Precautions/Restrictions  Precautions Precautions: Fall Required Braces or Orthoses: No Restrictions Weight Bearing Restrictions: No Prior Functioning    Prior Function Comments: plans to go home with daughter; house, one level with no steps to enter, will have 24 hour assist at home Cognition Cognition Arousal/Alertness: Awake/alert Overall Cognitive Status: Appears within functional limits for tasks assessed Orientation Level: Oriented X4 Sensation/Coordination Sensation Light Touch: Appears Intact Extremity Assessment RUE Assessment RUE Assessment:  (defer to OT) LUE Assessment LUE Assessment:  (defer to OT) RLE Assessment RLE Assessment:  (grossly 3-4/5, knees buckling during side stepping) LLE Assessment LLE Assessment:  (grossly 3-4/5; knees buckling during side stepping) Mobility (including Balance) Bed Mobility Bed Mobility: Yes Rolling Right: 2: Max assist Rolling Right Details (indicate cue type and reason): use of pad to assist and initiate rolling  Supine to Sit: 2: Max assist Supine to Sit Details (indicate cue type and reason): max for sequencing and follow through Sitting - Scoot to Edge of Bed: 3: Mod assist Sitting - Scoot to Edge of Bed Details (indicate cue type and reason): modA to sequence Sit to Supine - Left: 1: +2 Total assist Sit to Supine - Left Details (indicate cue type and reason): +3totalpt30-40% with assist to elevate legs and reposition; once in bed pt having difficulty shifting hips needing facilitation with pad to get her better positioned Scooting to [facility]: 1: +2 Total assist Scooting to [facility] Details (indicate cue type and reason): +2totalpt10% with use of pad to scoot Transfers Transfers: Yes Sit to Stand: 1: +2 Total assist;With upper  extremity assist;From elevated surface;From bed Sit to Stand Details (indicate cue type and reason): pt stood x2 with +2totalpt40-45% on the first trial needing sequencing cues and significant facilitation for hip/trunk extension; the second time pt stood she needed more sequencing cues as pt more segmental with hands/trunk vs fluidity; cueing for anterior translation and increased facilitatory cues for follow through trunk extension pt performing 35-40% seond stand Stand to Sit: 3: Mod assist Ambulation/Gait Ambulation/Gait: Yes Ambulation/Gait Assistance: 1: +2 Total assist Ambulation/Gait Assistance Details (indicate cue type and reason): pt took 4-5 side steps to the [facility] in the right direction with RW; pt performed this with +2totalpt40-50% needing cueing for upright posture, sequencing with RW; knees tending towards buckling during stepping given lower extremity weakness Assistive device: Rolling walker  Posture/Postural Control Posture/Postural Control: No significant limitations Exercise  Total Joint Exercises Ankle Circles/Pumps: AROM;5 reps;Supine End of Session PT - End of Session Equipment Utilized During Treatment: Gait belt Activity Tolerance: Patient tolerated treatment well;Patient limited by fatigue Patient left: in bed;with call bell in reach (per RN request ) Nurse Communication: Mobility status for transfers General Behavior During Session: [facility] for tasks performed Cognition: [facility] for tasks performed  [facility] HELEN 04/11/2011, 10:47 AM

## 2011-04-11 NOTE — Telephone Encounter (Signed)
Noted - i saw the hospital records

## 2011-04-12 LAB — PHOSPHORUS: Phosphorus: 2.4 mg/dL (ref 2.3–4.6)

## 2011-04-12 LAB — GLUCOSE, CAPILLARY
Glucose-Capillary: 177 mg/dL — ABNORMAL HIGH (ref 70–99)
Glucose-Capillary: 257 mg/dL — ABNORMAL HIGH (ref 70–99)
Glucose-Capillary: 257 mg/dL — ABNORMAL HIGH (ref 70–99)

## 2011-04-12 LAB — HEPARIN LEVEL (UNFRACTIONATED): Heparin Unfractionated: 0.61 IU/mL (ref 0.30–0.70)

## 2011-04-12 LAB — CBC
Hemoglobin: 8.9 g/dL — ABNORMAL LOW (ref 12.0–15.0)
RBC: 3.41 MIL/uL — ABNORMAL LOW (ref 3.87–5.11)
WBC: 31 10*3/uL — ABNORMAL HIGH (ref 4.0–10.5)

## 2011-04-12 LAB — BASIC METABOLIC PANEL
CO2: 30 mEq/L (ref 19–32)
Calcium: 8.9 mg/dL (ref 8.4–10.5)
Chloride: 105 mEq/L (ref 96–112)
Sodium: 142 mEq/L (ref 135–145)

## 2011-04-12 LAB — HEMOGLOBIN A1C: Mean Plasma Glucose: 126 mg/dL — ABNORMAL HIGH (ref <117)

## 2011-04-12 LAB — PROTIME-INR
INR: 1.24 (ref 0.00–1.49)
Prothrombin Time: 15.9 seconds — ABNORMAL HIGH (ref 11.6–15.2)

## 2011-04-12 LAB — MAGNESIUM: Magnesium: 2 mg/dL (ref 1.5–2.5)

## 2011-04-12 MED ORDER — SODIUM CHLORIDE 0.9 % IJ SOLN
10.0000 mL | Freq: Two times a day (BID) | INTRAMUSCULAR | Status: DC
  Administered 2011-04-13 – 2011-04-19 (×6): 10 mL

## 2011-04-12 MED ORDER — SODIUM CHLORIDE 0.9 % IJ SOLN
10.0000 mL | INTRAMUSCULAR | Status: DC | PRN
  Administered 2011-04-12 – 2011-04-18 (×12): 10 mL

## 2011-04-12 MED ORDER — INSULIN GLARGINE 100 UNIT/ML ~~LOC~~ SOLN
15.0000 [IU] | Freq: Every day | SUBCUTANEOUS | Status: DC
  Administered 2011-04-12 – 2011-04-13 (×2): 15 [IU] via SUBCUTANEOUS
  Filled 2011-04-12: qty 3

## 2011-04-12 MED ORDER — ALBUTEROL SULFATE (5 MG/ML) 0.5% IN NEBU
INHALATION_SOLUTION | RESPIRATORY_TRACT | Status: AC
  Filled 2011-04-12: qty 0.5

## 2011-04-12 MED ORDER — IPRATROPIUM BROMIDE 0.02 % IN SOLN
RESPIRATORY_TRACT | Status: AC
  Filled 2011-04-12: qty 2.5

## 2011-04-12 MED ORDER — DIPHENHYDRAMINE HCL 50 MG/ML IJ SOLN
25.0000 mg | Freq: Four times a day (QID) | INTRAMUSCULAR | Status: DC | PRN
  Administered 2011-04-12 – 2011-04-13 (×3): 25 mg via INTRAVENOUS
  Administered 2011-04-13: 09:00:00 via INTRAVENOUS
  Administered 2011-04-13 – 2011-04-19 (×7): 25 mg via INTRAVENOUS
  Filled 2011-04-12 (×11): qty 1

## 2011-04-12 MED ORDER — INSULIN REGULAR HUMAN 100 UNIT/ML IJ SOLN
INTRAVENOUS | Status: AC
  Administered 2011-04-12: 18:00:00 via INTRAVENOUS
  Filled 2011-04-12: qty 2000

## 2011-04-12 MED ORDER — LORAZEPAM 2 MG/ML PO CONC: 0.5000 mg | Freq: Every day | ORAL | Status: DC

## 2011-04-12 MED ORDER — LORAZEPAM 0.5 MG PO TABS
0.5000 mg | ORAL_TABLET | Freq: Every day | ORAL | Status: DC
  Administered 2011-04-13 – 2011-04-18 (×7): 0.5 mg via SUBLINGUAL
  Filled 2011-04-12 (×7): qty 1

## 2011-04-12 NOTE — Progress Notes (Signed)
Subjective: 78 y/o admitted with abdominal pain and distension and noted to be bradycardic down to 40s on admission.  She has had diverticulitis in the past and states that this episode is similar to her last. By 04/06/2011 she developed progressive hypoxia and antibiotics were broadened to cover for possible healthcare acquired pneumonia. Due to persistent abdominal pain a CT scan of the abdomen was obtained and question evolving jejunal abscesses in association with acute diverticulitis. By 04/07/2011 she developed progressive mental status changes an ABG demonstrated mixed respiratory and metabolic acidosis that was not responsive to noninvasive positive ventilation. Care was eventually assumed by pulmonary critical care medicine and surgery was consulted. She was eventually intubated for airway protection. It was also felt a degree of her respiratory failure was due to mechanical issues do to significant abdominal distention therefore an NG tube was placed because of her partial small bowel obstruction.  She is disappointed today that the repeat Ct finding are not encouraging. She is comfortable without abdominal pain. She is only asking for something to help her relax and fall asleep tonight.    Objective: Blood pressure 147/42, pulse 68, temperature 97.4 F (36.3 C), temperature source Oral, resp. rate 18, height 5\' 3"  (1.6 m), weight 98.884 kg (218 lb), SpO2 97.00%. Weight change:   Intake/Output Summary (Last 24 hours) at 04/12/11 1758 Last data filed at 04/12/11 1300  Gross per 24 hour  Intake  459.5 ml  Output   1236 ml  Net -776.5 ml   Physical Exam: General appearance:  alert, cooperative, no distress.  Head: Normocephalic, without obvious abnormality, atraumatic   Lungs: Coarse to auscultation bilaterally, 4 L nasal cannula, respiratory effort shallow with diminished sounds in the bases, no tachypnea Heart: regular rate and rhythm, S1, S2 normal, no murmur, click, rub or gallop, IV  fluids at keep open, IV heparin infusing Abdomen: distended, tympanic, soft. Mild diffusely tenderness. Bowel sounds are diminished NG tube to low suction with green bilious returns noted, has parenteral nutrition infusing Extremities: extremities normal, atraumatic, no cyanosis or edema   Lab Results:  Basename 04/12/11 0500 04/11/11 0400  NA 142 143  K 3.2* 3.7  CL 105 106  CO2 30 27  GLUCOSE 212* 269*  BUN 49* 44*  CREATININE 1.57* 1.71*  CALCIUM 8.9 8.4  MG 2.0 2.1  PHOS 2.4 3.5    Basename 04/12/11 0500 04/11/11 0400 04/10/11 0530  WBC 31.0* 24.5* --  NEUTROABS -- 23.3* 20.9*  HGB 8.9* 8.4* --  HCT 30.8* 28.3* --  MCV 90.3 89.3 --  PLT 370 318 --  CT ABDOMEN AND PELVIS WITHOUT CONTRAST  IMPRESSION:  Decreased inflammation involving the proximal jejunal loops and  decreased small bowel distention.  There are approximately five small low density collections adjacent  to the jejunal loops in the mid abdomen. Findings are probably  related to focal fluid pockets but could represent phlegmonous  collections. Recommend follow-up to exclude early abscess  formations.  There is increased perihepatic ascites. Slightly decreased fluid  in the left paracolic gutter. Pleural effusions may have slightly  enlarged.  Cholelithiasis with mild distention of the gallbladder.   Assessment/Plan:  Acute hypoxemic and hypercarbic respiratory failure/question healthcare acquired pneumonia Markedly improved. He is still requiring higher doses of oxygen and was utilizing at home but is in no acute distress. Last chest x-ray 04/10/2011 shows occluded left lower lobe bronchus and a question of mucous plugging with associated dense left lower lobe atelectasis and/or pneumonia as well as  developing pulmonary venous hypertension and mild interstitial pulmonary edema and enlarging bilateral pleural effusions. We discussed this with Dr. Tyson Alias who is with care medicine and he will continue to  assist [place] in regards to management of this problem. We will begin mobilization and continue supportive care with oxygen and nebs.  Acute diffuse Diverticlulitis of the jejunum  There was no evidence of perforation or abscess formation on the initial CT scan of the abdomen performed 04/03/2011.  Unfortunately leukocytosis is worsening and CT reveals what may be developing abscesses. She is on Zosyn.   Metabolic encephalopathy RESOLVED  Partial small bowel obstruction Continue NG tube to low suction as well as parenteral nutrition.   Atrial fibrillation with Bradycardia  The patient's bradycardia was likely due to a medication response possibly worsened due to high vagal tone in the setting of persistent abdominal pain and nausea-her heart rate has improved to 60's and 70s upon holding her rate controlling medications. Currently due to n.p.o. status her normal antihypertensive medications as well as rate control medications have been on hold. Currently no evidence of rapid ventricular response or recurrent bradycardia.  Hypotension  RESOLVED  Acute renal failure/ hyperkalemia  Marked improvement in renal function/creatinine. Potassium is normalized and creatinine is stable consistent with the patient's baseline.  RENAL FAILURE, CHRONIC stage 4/ANCA+ pauci-immune glomerulonephritis  Baseline creatinine is reported to be 2.4  DIABETES MELLITUS, TYPE II /Hyperglycemia CBGs have been elevated greater than 200 in combination with apparent cure nutrition as well as IV steroids. We will continue sliding scale insulin but increase to a moderate scale and have added low-dose Lantus- will increase to 15 today.   DIASTOLIC HEART FAILURE, CHRONIC/small bilateral pleural effusions  Repeat CXR 04/05/2011 was concerning for pulmonary edema therefore Lasix was resumed 40 mg IV every 12 hours and IV fluids were discontinued. She remained in a positive fluid balance after that. She continues with what  appears to be evolving effusions. Lasix remains on hold but will consider resuming, defer to pulmonary medicine recommendations.  Chronic hypoxemic respiratory failure secondary to COPD  on home O2 continuously at 2 L per minute-diminished inspiratory effort related to abdominal pain is currently felt to be contributing to persistent hypoxia and increased oxygen needs which are stable at this point.     LOS: 10 days   [facility] [phone] 04/12/2011, 5:58 PM

## 2011-04-12 NOTE — Progress Notes (Signed)
PARENTERAL NUTRITION CONSULT NOTE - FOLLOW UP  Pharmacy Consult for TPN Indication: prolonged ileus  Patient Measurements: Height: 5\' 4"  (162.6 cm) Weight: 218 lb 4.1 oz (99 kg) IBW/kg (Calculated) : 54.7   Vital Signs: Temp: 97.5 F (36.4 C) (01/01 0528) Temp src: Oral (01/01 0528) BP: 149/62 mmHg (01/01 0528) Pulse Rate: 74  (01/01 0528)  Intake/Output from previous day: 12/31 0701 - 01/01 0700 In: 1744.5 [I.V.:262.5; NG/GT:650; IV Piggyback:87; TPN:745] Out: 1286 [Urine:950; Emesis/NG output:135; Stool:201]  Labs:  Basename 04/12/11 0500 04/11/11 0900 04/11/11 0400 04/10/11 0530  WBC 31.0* -- 24.5* 22.4*  HGB 8.9* -- 8.4* 8.7*  HCT 30.8* -- 28.3* 28.8*  PLT 370 -- 318 298  APTT -- -- -- --  INR 1.24 1.33 -- 1.79*     Basename 04/12/11 0500 04/11/11 0400 04/10/11 0530  NA 142 143 145  K 3.2* 3.7 3.5  CL 105 106 106  CO2 30 27 26   GLUCOSE 212* 269* 85  BUN 49* 44* 46*  CREATININE 1.57* 1.71* 1.84*  LABCREA -- -- --  CREAT24HRUR -- -- --  CALCIUM 8.9 8.4 8.5  MG 2.0 2.1 2.0  PHOS 2.4 3.5 3.2  PROT -- 5.3* 5.1*  ALBUMIN -- 1.8* 1.6*  AST -- 10 16  ALT -- 10 10  ALKPHOS -- 54 60  BILITOT -- 0.2* 0.3  BILIDIR -- -- --  IBILI -- -- --  PREALBUMIN -- 9.0* 8.0*  TRIG -- 306* 293*  CHOLHDL -- -- --  CHOL -- 107 91   Estimated Creatinine Clearance: 33.8 ml/min (by C-G formula based on Cr of 1.57).    Basename 04/12/11 0629 04/12/11 0418 04/12/11 0029  GLUCAP 165* 246* 316*   Insulin Requirements in the past 24 hours:  10 units of sliding scale CBGs 160-269 - not at goal   Current Nutrition:  TPN Clinimix 5/15 at 67ml/hr  Assessment: Diverticulitis with abscess and ileus. Permissive underfeeding Kcal goals per ASPEN guidelines and RD recommendations. TPN started last night and since initiation CBGs have been uncontrolled. Noted that pt does have a history of DM and is also on methylprednisolone.CBGs trending down as Lantus increased and insulin in  TPN.Last prealbumin was 9. Triglycerides were elevated at 306. Lipids not held since <400. Will check triglycerides in am (also will only be getting lipids on MWF d/t national backorder). Remains off propofol therapy. Electrolytes are trending down and will add lytes to TPN today.   Nutritional Goals:  1017-1187 kCal, 75-85 grams of protein per day (premix TPN will not allow [place] to meet her protein goals)  Plan:  1. Continue clinimix 5/15 to goal rate of 61ml/hr.  No lipids today 2. Check BMET, Mg, + Phos in AM 3. F/u triglycerides in am to assess effect from lipid supplementation  Kyce Ging Poteet 04/12/2011,8:19 AM

## 2011-04-12 NOTE — Progress Notes (Signed)
See details in note by WJ,PA. She wants to eat, says she is still sore in the abdomen.  On exam she is alert oriented and does not appear uncomfortable. Her abdomen is soft very mildly tender. She notes it is considerably improved over the last couple of days.  Labs: Reviewed. Her white count unfortunately is going up.her CT is reviewed and she does have fluid collections. These may need to be drained. There does not appear to be a current leak of intestinal contents.  We'll continue current management. Because her pulmonary status she is high risk for any operative intervention.

## 2011-04-12 NOTE — Progress Notes (Signed)
ANTICOAGULATION CONSULT NOTE - Follow Up Consult  Pharmacy Consult for Heparin Indication: atrial fibrillation  Allergies  Allergen Reactions  . Sulfonamide Derivatives     Childhood reaction    Patient Measurements: Height: 5\' 4"  (162.6 cm) Weight: 218 lb 4.1 oz (99 kg) IBW/kg (Calculated) : 54.7   Vital Signs: Temp: 97.5 F (36.4 C) (01/01 0528) Temp src: Oral (01/01 0528) BP: 149/62 mmHg (01/01 0528) Pulse Rate: 74  (01/01 0528)  Labs:  Basename 04/12/11 0500 04/11/11 1730 04/11/11 0900 04/11/11 0400 04/10/11 0530  HGB 8.9* -- -- 8.4* --  HCT 30.8* -- -- 28.3* 28.8*  PLT 370 -- -- 318 298  APTT -- -- -- -- --  LABPROT 15.9* -- 16.7* -- 21.1*  INR 1.24 -- 1.33 -- 1.79*  HEPARINUNFRC 0.61 0.46 0.55 -- --  CREATININE 1.57* -- -- 1.71* 1.84*  CKTOTAL -- -- -- -- --  CKMB -- -- -- -- --  TROPONINI -- -- -- -- --   Estimated Creatinine Clearance: 33.8 ml/min (by C-G formula based on Cr of 1.57).   Medications:  Scheduled:    . albuterol  2 puff Inhalation BID  . antiseptic oral rinse  15 mL Mouth Rinse QID  . Chlorhexidine Gluconate Cloth  6 each Topical Q0600  . fluticasone  2 puff Inhalation BID  . insulin aspart  0-15 Units Subcutaneous Q4H  . insulin glargine  10 Units Subcutaneous QHS  . ipratropium  2 puff Inhalation BID  . methylPREDNISolone (SOLU-MEDROL) injection  40 mg Intravenous Daily  . mupirocin ointment  1 application Nasal BID  . pantoprazole (PROTONIX) IV  40 mg Intravenous Q24H  . piperacillin-tazobactam (ZOSYN)  IV  3.375 g Intravenous Q8H  . sodium chloride  10 mL Intracatheter Q12H  . DISCONTD: albuterol  2 puff Inhalation QID  . DISCONTD: insulin glargine  5 Units Subcutaneous QHS  . DISCONTD: ipratropium  2 puff Inhalation QID    Assessment: 78 y/o female patient receiving heparin infusion for h/o afib. INR subtherapeutic, coumadin on hold d/t NPO status. We could give coumadin IV and transition off heparin if desired. Heparin level  therapeutic. No bleeding reported.   Goal of Therapy:  INR 2-3   Plan:  Continue heparin gtt at 1650 units/hr and f/u in am.  Verlene Mayer, PharmD, BCPS Pager [phone] 04/12/2011,10:35 AM

## 2011-04-12 NOTE — Progress Notes (Signed)
Subjective: She denies pain in abd.  + Flatus, +BM CT scan yesterday shows improvement in SB distension., but some fluid collections, which could be early abscess.WBC is up.  Objective: Vital signs in last 24 hours: Temp:  [97.4 F (36.3 C)-98 F (36.7 C)] 97.5 F (36.4 C) (01/01 0528) Pulse Rate:  [67-75] 74  (01/01 0528) Resp:  [21] 21  (01/01 0528) BP: (109-164)/(50-105) 149/62 mmHg (01/01 0528) SpO2:  [94 %-97 %] 96 % (01/01 0528) Last BM Date: 04/11/11  Intake/Output from previous day: 12/31 0701 - 01/01 0700 In: 1744.5 [I.V.:262.5; NG/GT:650; IV Piggyback:87; TPN:745] Out: 1286 [Urine:950; Emesis/NG output:135; Stool:201] Intake/Output this shift:    EXAM:  Alert no discomfort, Abd fairly soft no tenderness on palpation.  + BS, +BM  Lab Results:   Basename 04/12/11 0500 04/11/11 0400  WBC 31.0* 24.5*  HGB 8.9* 8.4*  HCT 30.8* 28.3*  PLT 370 318    BMET  Basename 04/12/11 0500 04/11/11 0400  NA 142 143  K 3.2* 3.7  CL 105 106  CO2 30 27  GLUCOSE 212* 269*  BUN 49* 44*  CREATININE 1.57* 1.71*  CALCIUM 8.9 8.4   PT/INR  Basename 04/12/11 0500 04/11/11 0900  LABPROT 15.9* 16.7*  INR 1.24 1.33     Studies/Results: Ct Abdomen Pelvis Wo Contrast  04/11/2011  *RADIOLOGY REPORT*  Clinical Data: 78 year-old with abdominal pain and distention. Jejunal diverticulitis.  CT ABDOMEN AND PELVIS WITHOUT CONTRAST  Technique:  Multidetector CT imaging of the abdomen and pelvis was performed following the standard protocol without intravenous contrast.  Comparison: 04/06/2011  Findings: Again noted are small-moderate sized pleural effusions with consolidation at the lung bases, particularly the left lung base.  Nasogastric extends into the stomach.  There is increased amount of perihepatic ascites.  The gallbladder is distended and contains high density stones and/or sludge.  Again noted is subcutaneous edema, particularly in the left flank. No gross abnormality to the  spleen, adrenal glands or pancreatic tissue.  Stable appearance of the kidneys without stones or hydronephrosis.  No evidence for an aortic aneurysm. Slightly decreased free fluid in the left paracolic gutter.  Slightly decreased fluid in the pelvis.  There are multiple diverticula involving the sigmoid colon.  Oral contrast throughout small and large bowel.  There is decreased small bowel distention in the left abdomen.  There appears to be decreased inflammation involving loops of jejunum within the left abdomen.  There are at least five scattered low density collections around the loops of jejunum in the mid abdomen.  These findings could represent focal fluid collections or fluid filled diverticula.  Largest measures 3.7 cm on sequence 2, image 48.  There is subcutaneous edema.  There is ankylosis of the L4 and L5 vertebral bodies with severe degenerative disease at L5-S1.  IMPRESSION: Decreased inflammation involving the proximal jejunal loops and decreased small bowel distention.  There are approximately five small low density collections adjacent to the jejunal loops in the mid abdomen.  Findings are probably related to focal fluid pockets but could represent phlegmonous collections.  Recommend follow-up to exclude early abscess formations.  There is increased perihepatic ascites.  Slightly decreased fluid in the left paracolic gutter.  Pleural effusions may have slightly enlarged.  Cholelithiasis with mild distention of the gallbladder.  Original Report Authenticated By: Richarda Overlie, M.D.   Dg Chest Port 1 View  04/10/2011  *RADIOLOGY REPORT*  Clinical Data: Bedside central venous catheter placement.  PORTABLE CHEST - 1 VIEW 04/10/2011  1736 hours:  Comparison: Portable chest x-ray earlier same date 0626 hours.  Findings: New right jugular central venous catheter tip in the upper SVC.  No evidence of pneumothorax mediastinal hematoma.  Left jugular venous catheter tip remains at the junction of the IJ and  innominate vein.  Nasogastric tube courses below the diaphragm into the stomach.  Abrupt occlusion of the left lower lobe bronchus, with dense consolidation in the left lower lobe.  Interval increase in size of bilateral pleural effusion since earlier today. Worsening pulmonary venous hypertension and perhaps mild interstitial pulmonary edema currently.  IMPRESSION:  1.  New right jugular central venous catheter tip in the upper SVC. No acute complicating features. 2.  Remaining support apparatus satisfactory. 3.  Occluded left lower lobe bronchus, query mucus plugging. Associated dense left lower lobe atelectasis and/or pneumonia. 4.  Developing pulmonary venous hypertension and mild interstitial pulmonary edema, query fluid overload. 5.  Enlarging bilateral pleural effusions.  Original Report Authenticated By: Arnell Sieving, M.D.    Anti-infectives: Anti-infectives     Start     Dose/Rate Route Frequency Ordered Stop   04/10/11 1400  piperacillin-tazobactam (ZOSYN) IVPB 3.375 g       3.375 g 12.5 mL/hr over 240 Minutes Intravenous 3 times per day 04/10/11 1135     04/07/11 1200   vancomycin (VANCOCIN) IVPB 1000 mg/200 mL premix  Status:  Discontinued        1,000 mg 200 mL/hr over 60 Minutes Intravenous Every 24 hours 04/06/11 0941 04/10/11 1152   04/06/11 1200   vancomycin (VANCOCIN) 1,500 mg in sodium chloride 0.9 % 500 mL IVPB        1,500 mg 250 mL/hr over 120 Minutes Intravenous  Once 04/06/11 0941 04/06/11 1343   04/06/11 1200   piperacillin-tazobactam (ZOSYN) IVPB 2.25 g  Status:  Discontinued        2.25 g 100 mL/hr over 30 Minutes Intravenous 4 times per day 04/06/11 0941 04/10/11 1135   04/04/11 0000   ciprofloxacin (CIPRO) IVPB 400 mg  Status:  Discontinued        400 mg 200 mL/hr over 60 Minutes Intravenous Every 24 hours 04/03/11 0036 04/06/11 0847   04/03/11 0045   ciprofloxacin (CIPRO) IVPB 400 mg        400 mg 200 mL/hr over 60 Minutes Intravenous NOW 04/03/11 0036  04/03/11 0145   04/02/11 2359   metroNIDAZOLE (FLAGYL) IVPB 500 mg  Status:  Discontinued        500 mg 100 mL/hr over 60 Minutes Intravenous Every 8 hours 04/02/11 2328 04/06/11 0847   04/02/11 2115   cefTRIAXone (ROCEPHIN) 1 g in dextrose 5 % 50 mL IVPB        1 g 100 mL/hr over 30 Minutes Intravenous  Once 04/02/11 2110 04/02/11 2314         Current Facility-Administered Medications  Medication Dose Route Frequency Provider Last Rate Last Dose  . 0.9 %  sodium chloride infusion   Intravenous Continuous Anders Simmonds, NP 20 mL/hr at 04/10/11 2131    . acetaminophen (TYLENOL) suppository 650 mg  650 mg Rectal Q6H PRN Carlota Raspberry, MD      . albuterol (PROVENTIL HFA;VENTOLIN HFA) 108 (90 BASE) MCG/ACT inhaler 2 puff  2 puff Inhalation BID Mcarthur Rossetti. Feinstein   2 puff at 04/12/11 0739  . antiseptic oral rinse (BIOTENE) solution 15 mL  15 mL Mouth Rinse QID Lonia Farber, MD   15 mL at 04/12/11 0400  .  Chlorhexidine Gluconate Cloth 2 % PADS 6 each  6 each Topical Q0600 Mcarthur Rossetti. Feinstein   6 each at 04/12/11 0545  . diphenhydrAMINE (BENADRYL) injection 25 mg  25 mg Intravenous Q6H PRN Saima Rizwan, MD      . fat emulsion 20 % infusion 168 mL  168 mL Intravenous Continuous TPN Drake Leach Rumbarger, PHARMD 7 mL/hr at 04/11/11 2300 168 mL at 04/11/11 2300  . fluticasone (FLOVENT HFA) 110 MCG/ACT inhaler 2 puff  2 puff Inhalation BID Wesam Yacoub   2 puff at 04/12/11 0739  . heparin ADULT infusion 100 units/ml (25000 units/250 ml)  1,650 Units/hr Intravenous Continuous Severiano Gilbert, PHARMD 16.5 mL/hr at 04/12/11 0435 1,650 Units/hr at 04/12/11 0435  . insulin aspart (novoLOG) injection 0-15 Units  0-15 Units Subcutaneous Q4H Calvert Cantor, MD   3 Units at 04/12/11 [phone]  . insulin glargine (LANTUS) injection 10 Units  10 Units Subcutaneous QHS Mcarthur Rossetti. Feinstein   10 Units at 04/11/11 2113  . ipratropium (ATROVENT HFA) inhaler 2 puff  2 puff Inhalation BID Mcarthur Rossetti. Feinstein   2  puff at 04/12/11 0739  . methylPREDNISolone sodium succinate (SOLU-MEDROL) 40 MG injection 40 mg  40 mg Intravenous Daily Anders Simmonds, NP   40 mg at 04/12/11 0938  . morphine 2 MG/ML injection 2 mg  2 mg Intravenous Q2H PRN Shan Levans, MD   2 mg at 04/12/11 0026  . mupirocin ointment (BACTROBAN) 2 % 1 application  1 application Nasal BID Mcarthur Rossetti. Feinstein   1 application at 04/12/11 0944  . pantoprazole (PROTONIX) injection 40 mg  40 mg Intravenous Q24H Billy Fischer, MD   40 mg at 04/11/11 2111  . piperacillin-tazobactam (ZOSYN) IVPB 3.375 g  3.375 g Intravenous Q8H [address] [place]   3.375 g at 04/12/11 0545  . sodium chloride 0.9 % injection 10 mL  10 mL Intracatheter Q12H Saima Rizwan, MD      . sodium chloride 0.9 % injection 10 mL  10 mL Intracatheter PRN Calvert Cantor, MD   10 mL at 04/12/11 0523  . tpn solution (CLINIMIX 5/15) 1,000 mL infusion   Intravenous Continuous TPN Madolyn Frieze, PHARMD 40 mL/hr at 04/10/11 1833    . tpn solution [facility] 5/15) 2,000 mL with multivitamins adult 10 mL, trace elements Cr-Cu-Mn-Se-Zn 1 mL, insulin regular 10 Units infusion   Intravenous Continuous TPN Drake Leach Rumbarger, PHARMD 55 mL/hr at 04/11/11 1745    . tpn solution (CLINIMIX E 5/15) 2,000 mL with insulin regular 10 Units infusion   Intravenous Continuous TPN Lora Poteet Seay, PHARMD      . DISCONTD: albuterol (PROVENTIL HFA;VENTOLIN HFA) 108 (90 BASE) MCG/ACT inhaler 2 puff  2 puff Inhalation QID Wesam Yacoub   2 puff at 04/11/11 1034  . DISCONTD: insulin glargine (LANTUS) injection 5 Units  5 Units Subcutaneous QHS Calvert Cantor, MD      . DISCONTD: ipratropium (ATROVENT HFA) inhaler 2 puff  2 puff Inhalation QID Wesam Yacoub   2 puff at 04/11/11 1035    Assessment/Plan Jejunal Diverticulitis, Improving SB distension, but fluid collections, and rising WBC. On solumedrol and Zosyn. COPD respiratory failure/OSA/Bradycardia on admission, CXR 12/30 LLL  bronchus plugging,atelectasis, ?pnemonia Hx DCHF/Afib Chronic renal Insuffiencey, improving. Hypothyroid AODM Anemia  Morbid obesity  Plan: Clinically on exam she's better.  WBC worrisome, along with fluid collections, but she has other sources for high WBC too.  Discuss with DR. Streck.  LOS: 10 days  Kimbree Casanas 04/12/2011

## 2011-04-13 LAB — CBC
Hemoglobin: 9.2 g/dL — ABNORMAL LOW (ref 12.0–15.0)
MCH: 26.4 pg (ref 26.0–34.0)
RBC: 3.49 MIL/uL — ABNORMAL LOW (ref 3.87–5.11)

## 2011-04-13 LAB — BASIC METABOLIC PANEL
CO2: 29 mEq/L (ref 19–32)
Chloride: 103 mEq/L (ref 96–112)
Sodium: 140 mEq/L (ref 135–145)

## 2011-04-13 LAB — PROTIME-INR
INR: 1.04 (ref 0.00–1.49)
Prothrombin Time: 13.8 seconds (ref 11.6–15.2)

## 2011-04-13 LAB — PHOSPHORUS: Phosphorus: 2.6 mg/dL (ref 2.3–4.6)

## 2011-04-13 LAB — TRIGLYCERIDES: Triglycerides: 336 mg/dL — ABNORMAL HIGH (ref <150)

## 2011-04-13 LAB — MAGNESIUM: Magnesium: 2.2 mg/dL (ref 1.5–2.5)

## 2011-04-13 LAB — HEPARIN LEVEL (UNFRACTIONATED): Heparin Unfractionated: 0.56 IU/mL (ref 0.30–0.70)

## 2011-04-13 MED ORDER — FUROSEMIDE 10 MG/ML IJ SOLN
20.0000 mg | Freq: Two times a day (BID) | INTRAMUSCULAR | Status: DC
  Administered 2011-04-13 – 2011-04-16 (×6): 20 mg via INTRAVENOUS
  Filled 2011-04-13 (×9): qty 2

## 2011-04-13 MED ORDER — FAT EMULSION 20 % IV EMUL
250.0000 mL | INTRAVENOUS | Status: AC
  Administered 2011-04-13: 250 mL via INTRAVENOUS
  Filled 2011-04-13: qty 250

## 2011-04-13 MED ORDER — POTASSIUM CHLORIDE 10 MEQ/100ML IV SOLN
10.0000 meq | INTRAVENOUS | Status: AC
  Administered 2011-04-13 (×4): 10 meq via INTRAVENOUS
  Filled 2011-04-13 (×4): qty 100

## 2011-04-13 MED ORDER — TRACE MINERALS CR-CU-MN-SE-ZN 10-1000-500-60 MCG/ML IV SOLN
INTRAVENOUS | Status: AC
  Administered 2011-04-13: 18:00:00 via INTRAVENOUS
  Filled 2011-04-13: qty 2000

## 2011-04-13 NOTE — Progress Notes (Signed)
Patient ID: [name], female   DOB: [birth year], 78 y.o.   MRN: [id number]    Subjective: Pt without c/o.  No abdominal pain.  Wants to eat.  Objective: Vital signs in last 24 hours: Temp:  [97.4 F (36.3 C)-98.7 F (37.1 C)] 98.2 F (36.8 C) (01/02 0630) Pulse Rate:  [68-83] 83  (01/02 0630) Resp:  [16-18] 16  (01/02 0630) BP: (111-147)/(42-75) 112/75 mmHg (01/02 0630) SpO2:  [95 %-97 %] 96 % (01/02 0700) Weight:  [218 lb (98.884 kg)] 218 lb (98.884 kg) (01/01 1420) Last BM Date: 04/12/11  Intake/Output from previous day: 01/01 0701 - 01/02 0700 In: 2690 [I.V.:876; IV Piggyback:160; TPN:1654] Out: 1602 [Urine:1500; Emesis/NG output:100; Stool:2] Intake/Output this shift:    PE: Abd: soft, minimally tender, few BS, obese  Lab Results:   Basename 04/13/11 0530 04/12/11 0500  WBC 27.9* 31.0*  HGB 9.2* 8.9*  HCT 31.4* 30.8*  PLT 349 370   BMET  Basename 04/13/11 0530 04/12/11 0500  NA 140 142  K 3.2* 3.2*  CL 103 105  CO2 29 30  GLUCOSE 182* 212*  BUN 57* 49*  CREATININE 1.52* 1.57*  CALCIUM 9.2 8.9   PT/INR  Basename 04/13/11 0530 04/12/11 0500  LABPROT 13.8 15.9*  INR 1.04 1.24     Studies/Results: Ct Abdomen Pelvis Wo Contrast  04/11/2011  *RADIOLOGY REPORT*  Clinical Data: 78 year-old with abdominal pain and distention. Jejunal diverticulitis.  CT ABDOMEN AND PELVIS WITHOUT CONTRAST  Technique:  Multidetector CT imaging of the abdomen and pelvis was performed following the standard protocol without intravenous contrast.  Comparison: 04/06/2011  Findings: Again noted are small-moderate sized pleural effusions with consolidation at the lung bases, particularly the left lung base.  Nasogastric extends into the stomach.  There is increased amount of perihepatic ascites.  The gallbladder is distended and contains high density stones and/or sludge.  Again noted is subcutaneous edema, particularly in the left flank. No gross abnormality to the spleen, adrenal  glands or pancreatic tissue.  Stable appearance of the kidneys without stones or hydronephrosis.  No evidence for an aortic aneurysm. Slightly decreased free fluid in the left paracolic gutter.  Slightly decreased fluid in the pelvis.  There are multiple diverticula involving the sigmoid colon.  Oral contrast throughout small and large bowel.  There is decreased small bowel distention in the left abdomen.  There appears to be decreased inflammation involving loops of jejunum within the left abdomen.  There are at least five scattered low density collections around the loops of jejunum in the mid abdomen.  These findings could represent focal fluid collections or fluid filled diverticula.  Largest measures 3.7 cm on sequence 2, image 48.  There is subcutaneous edema.  There is ankylosis of the L4 and L5 vertebral bodies with severe degenerative disease at L5-S1.  IMPRESSION: Decreased inflammation involving the proximal jejunal loops and decreased small bowel distention.  There are approximately five small low density collections adjacent to the jejunal loops in the mid abdomen.  Findings are probably related to focal fluid pockets but could represent phlegmonous collections.  Recommend follow-up to exclude early abscess formations.  There is increased perihepatic ascites.  Slightly decreased fluid in the left paracolic gutter.  Pleural effusions may have slightly enlarged.  Cholelithiasis with mild distention of the gallbladder.  Original Report Authenticated By: Richarda Overlie, M.D.    Anti-infectives: Anti-infectives     Start     Dose/Rate Route Frequency Ordered Stop   04/10/11 1400  piperacillin-tazobactam (ZOSYN) IVPB 3.375 g       3.375 g 12.5 mL/hr over 240 Minutes Intravenous 3 times per day 04/10/11 1135     04/07/11 1200   vancomycin (VANCOCIN) IVPB 1000 mg/200 mL premix  Status:  Discontinued        1,000 mg 200 mL/hr over 60 Minutes Intravenous Every 24 hours 04/06/11 0941 04/10/11 1152    04/06/11 1200   vancomycin (VANCOCIN) 1,500 mg in sodium chloride 0.9 % 500 mL IVPB        1,500 mg 250 mL/hr over 120 Minutes Intravenous  Once 04/06/11 0941 04/06/11 1343   04/06/11 1200   piperacillin-tazobactam (ZOSYN) IVPB 2.25 g  Status:  Discontinued        2.25 g 100 mL/hr over 30 Minutes Intravenous 4 times per day 04/06/11 0941 04/10/11 1135   04/04/11 0000   ciprofloxacin (CIPRO) IVPB 400 mg  Status:  Discontinued        400 mg 200 mL/hr over 60 Minutes Intravenous Every 24 hours 04/03/11 0036 04/06/11 0847   04/03/11 0045   ciprofloxacin (CIPRO) IVPB 400 mg        400 mg 200 mL/hr over 60 Minutes Intravenous NOW 04/03/11 0036 04/03/11 0145   04/02/11 2359   metroNIDAZOLE (FLAGYL) IVPB 500 mg  Status:  Discontinued        500 mg 100 mL/hr over 60 Minutes Intravenous Every 8 hours 04/02/11 2328 04/06/11 0847   04/02/11 2115   cefTRIAXone (ROCEPHIN) 1 g in dextrose 5 % 50 mL IVPB        1 g 100 mL/hr over 30 Minutes Intravenous  Once 04/02/11 2110 04/02/11 2314           Assessment/Plan  1. Jejunal diverticulitis, improving  Plan:  1. Pt with very low NG tube output.  No further discomfort.  Will likely d/c NGT and allow clears today.  Will d/w MD. 2. Will need repeat CT scan in several days.  LOS: 11 days    Nazeer Romney E 04/13/2011

## 2011-04-13 NOTE — Progress Notes (Signed)
+  bm.  + flatus.  Will discontinue NG

## 2011-04-13 NOTE — Progress Notes (Signed)
Physical Therapy Treatment Patient Details Name: CABELLA KIMM MRN: [id number] DOB: 05/07/1930 Today's Date: 04/13/2011  PT Assessment/Plan  PT - Assessment/Plan Comments on Treatment Session: Pt very excited to work with therapy and is very motivated. Pt continues to be a great canidate for inpatient rehab. PT Plan: Discharge plan remains appropriate PT Frequency: Min 3X/week Follow Up Recommendations: Inpatient Rehab Equipment Recommended: Defer to next venue PT Goals  Acute Rehab PT Goals PT Goal: Rolling Supine to Left Side - Progress: Progressing toward goal PT Goal: Supine/Side to Sit - Progress: Progressing toward goal PT Goal: Sit to Stand - Progress: Progressing toward goal PT Goal: Stand to Sit - Progress: Progressing toward goal  PT Treatment Precautions/Restrictions  Precautions Precautions: Fall Required Braces or Orthoses: No Restrictions Weight Bearing Restrictions: No Mobility (including Balance) Bed Mobility Rolling Left: 1: +2 Total assist;Other (comment);With rail (60%) Right Sidelying to Sit: 1: +2 Total assist;Patient percentage (comment);HOB elevated (comment degrees);With rails Right Sidelying to Sit Details (indicate cue type and reason): A to facilitate rolling of trunk and shoulders. Cues for positioning. A to push up at shoulders and trunk and positioning of LEs Sitting - Scoot to Edge of Bed: 4: Min assist Sitting - Scoot to Edge of Bed Details (indicate cue type and reason): Min A with pad for positioning.  Transfers Sit to Stand: 1: +2 Total assist;Patient percentage (comment);From bed;From elevated surface;With upper extremity assist (p=40%) Sit to Stand Details (indicate cue type and reason): Pt stood x4. Pt unable to obtain fully upright posture with extended hips. Pt stood x2 using Steady and was able to pull up into full upright posture. Cues for safe hand placement and technique.  Stand to Sit: 3: Mod assist;To chair/3-in-1;With upper extremity  assist Stand to Sit Details: Cues for safe hand placement. A to control descent into recliner.  Stand Pivot Transfers: 1: +2 Total assist;Other (comment);From elevated surface (with steady) Stand Pivot Transfer Details (indicate cue type and reason): Pt postioned in Steady. Unable to ambulate todays session secondary to complaints of weakness and fatique Ambulation/Gait Ambulation/Gait: No    Exercise    End of Session PT - End of Session Equipment Utilized During Treatment: Gait belt;Other (comment) (Steady) Activity Tolerance: Patient limited by fatigue Patient left: in chair;with call bell in reach Nurse Communication: Mobility status for transfers;Mobility status for ambulation General Behavior During Session: [facility] for tasks performed Cognition: [facility] for tasks performed  Jackye Dever, Adline Potter 04/13/2011, 11:12 AM 04/13/2011 Fredrich Birks PTA [phone] pager [phone] office

## 2011-04-13 NOTE — Progress Notes (Signed)
ANTICOAGULATION CONSULT NOTE - Follow Up Consult  Pharmacy Consult for Heparin Indication: atrial fibrillation  Allergies  Allergen Reactions  . Sulfonamide Derivatives     Childhood reaction    Patient Measurements: Height: 5\' 3"  (160 cm) Weight: 218 lb (98.884 kg) IBW/kg (Calculated) : 52.4   Vital Signs: Temp: 98.2 F (36.8 C) (01/02 0630) BP: 112/75 mmHg (01/02 0630) Pulse Rate: 83  (01/02 0630)  Labs:  Basename 04/13/11 0530 04/12/11 0500 04/11/11 1730 04/11/11 0900 04/11/11 0400  HGB 9.2* 8.9* -- -- --  HCT 31.4* 30.8* -- -- 28.3*  PLT 349 370 -- -- 318  APTT -- -- -- -- --  LABPROT 13.8 15.9* -- 16.7* --  INR 1.04 1.24 -- 1.33 --  HEPARINUNFRC 0.56 0.61 0.46 -- --  CREATININE 1.52* 1.57* -- -- 1.71*  CKTOTAL -- -- -- -- --  CKMB -- -- -- -- --  TROPONINI -- -- -- -- --   Estimated Creatinine Clearance: 34.2 ml/min (by C-G formula based on Cr of 1.52).   Medications:  Scheduled:    . albuterol  2 puff Inhalation BID  . antiseptic oral rinse  15 mL Mouth Rinse QID  . Chlorhexidine Gluconate Cloth  6 each Topical Q0600  . fluticasone  2 puff Inhalation BID  . furosemide  20 mg Intravenous BID  . insulin aspart  0-15 Units Subcutaneous Q4H  . insulin glargine  15 Units Subcutaneous QHS  . ipratropium  2 puff Inhalation BID  . LORazepam  0.5 mg Sublingual QHS  . mupirocin ointment  1 application Nasal BID  . pantoprazole (PROTONIX) IV  40 mg Intravenous Q24H  . piperacillin-tazobactam (ZOSYN)  IV  3.375 g Intravenous Q8H  . potassium chloride  10 mEq Intravenous Q1 Hr x 4  . sodium chloride  10 mL Intracatheter Q12H  . DISCONTD: insulin glargine  10 Units Subcutaneous QHS  . DISCONTD: LORazepam  0.5 mg Oral QHS  . DISCONTD: methylPREDNISolone (SOLU-MEDROL) injection  40 mg Intravenous Daily    Assessment: 78 y/o female patient receiving heparin infusion for afib, heparin level is therapeutic. Coumadin on hold d/t partial bowel obstxn. No bleeding  reported.  Goal of Therapy:  Heparin level 0.3-0.7 units/ml   Plan:  Continue heparin gtt at 1650 units/hr and f/u in am. Consider resume coumadin if no surgery planned.  Verlene Mayer, PharmD, BCPS Pager [phone] 04/13/2011,12:49 PM

## 2011-04-13 NOTE — Progress Notes (Signed)
Subjective: Abdominal pain is better, breathing ok, passing gas, had 2BMs today  Objective: Blood pressure 112/75, pulse 83, temperature 98.2 F (36.8 C), temperature source Oral, resp. rate 16, height 5\' 3"  (1.6 m), weight 98.884 kg (218 lb), SpO2 96.00%. Weight change:   Intake/Output Summary (Last 24 hours) at 04/13/11 1136 Last data filed at 04/13/11 0700  Gross per 24 hour  Intake   2690 ml  Output   1602 ml  Net   1088 ml   Physical Exam: General appearance:  alert, cooperative, no distress.  Head: Normocephalic, without obvious abnormality, atraumatic   Lungs: Coarse to auscultation bilaterally, 4 L nasal cannula, respiratory effort shallow with diminished sounds in the bases, no tachypnea Heart: regular rate and rhythm, S1, S2 normal, no murmur, click, rub or gallop, IV fluids at keep open, IV heparin infusing Abdomen: distended, tympanic, soft. Mild diffusely tenderness. Bowel sounds are diminished NG tube to low suction with green bilious returns noted, has parenteral nutrition infusing Extremities: extremities normal, atraumatic, trace edema Lab Results:  Basename 04/13/11 0530 04/12/11 0500  NA 140 142  K 3.2* 3.2*  CL 103 105  CO2 29 30  GLUCOSE 182* 212*  BUN 57* 49*  CREATININE 1.52* 1.57*  CALCIUM 9.2 8.9  MG 2.2 2.0  PHOS 2.6 2.4    Basename 04/13/11 0530 04/12/11 0500 04/11/11 0400  WBC 27.9* 31.0* --  NEUTROABS -- -- 23.3*  HGB 9.2* 8.9* --  HCT 31.4* 30.8* --  MCV 90.0 90.3 --  PLT 349 370 --  CT ABDOMEN AND PELVIS WITHOUT CONTRAST  IMPRESSION:  Decreased inflammation involving the proximal jejunal loops and  decreased small bowel distention.  There are approximately five small low density collections adjacent  to the jejunal loops in the mid abdomen. Findings are probably  related to focal fluid pockets but could represent phlegmonous  collections. Recommend follow-up to exclude early abscess  formations.  There is increased perihepatic  ascites. Slightly decreased fluid  in the left paracolic gutter. Pleural effusions may have slightly  enlarged.  Cholelithiasis with mild distention of the gallbladder.   Assessment/Plan:  Acute hypoxemic and hypercarbic respiratory failure/question healthcare acquired pneumonia Markedly improved. He is still requiring higher doses of oxygen and was utilizing at home but is in no acute distress.  Secondary to pleural effusions, and possible consolidation Continue Zosyn, start low dose lasix BID, follow 1/0s, repeat CXR in am, wean O2  Acute diffuse Diverticlulitis of the jejunum  There was no evidence of perforation or abscess formation on the initial CT scan of the abdomen performed 04/03/2011.  WBC somewhat better,  CT yesterday reveals what may be developing abscesses. She is on Zosyn.  Per CCS, ? DC NG and start clears  Metabolic encephalopathy RESOLVED  Partial small bowel obstruction Clinically improving, differ to CCS  Atrial fibrillation with Bradycardia  The patient's bradycardia was likely due to a medication response possibly worsened due to high vagal tone in the setting of persistent abdominal pain and nausea-her heart rate has improved to 60's and 70s upon holding her rate controlling medications. Currently due to n.p.o. status her normal antihypertensive medications as well as rate control medications have been on hold. Currently no evidence of rapid ventricular response or recurrent bradycardia.  Hypotension  RESOLVED  Acute renal failure/ hyperkalemia  Marked improvement in renal function/creatinine. Potassium is normalized and creatinine is stable consistent with the patient's baseline.  RENAL FAILURE, CHRONIC stage 4/ANCA+ pauci-immune glomerulonephritis  Baseline creatinine is reported to  be 2.4  DIABETES MELLITUS, TYPE II /Hyperglycemia CBGs have been elevated greater than 200 in combination with apparent cure nutrition as well as IV steroids. We will  continue sliding scale insulin but increase to a moderate scale and have added low-dose Lantus- will increase to 15 today.   Chronic hypoxemic respiratory failure secondary to COPD  on home O2 continuously at 2 L per minute-diminished inspiratory effort related to abdominal pain is currently felt to be contributing to persistent hypoxia and increased oxygen needs which are stable at this point.     LOS: 11 days   [name] [phone] 04/13/2011, 11:36 AM

## 2011-04-13 NOTE — Progress Notes (Signed)
Occupational Therapy Treatment Patient Details Name: [name] MRN: [id number] DOB: Jan 10, 1931 Today's Date: 04/13/2011  OT Assessment/Plan OT Assessment/Plan Comments on Treatment Session: Pt would benefit greatly from inpatient rehab prior to d/c home.  Pt educated on benefits of rehab, however pt reports she would like to d/c home. Pt is very motivated to participate in therapy. OT Plan: Discharge plan remains appropriate OT Frequency: Min 2X/week Follow Up Recommendations: Inpatient Rehab Equipment Recommended: Defer to next venue OT Goals ADL Goals Pt Will Perform Grooming: with min assist;with set-up;Standing at sink ADL Goal: Grooming - Progress: Progressing toward goals Pt Will Transfer to Toilet: with mod assist;3-in-1;Stand pivot transfer ADL Goal: Toilet Transfer - Progress: Progressing toward goals  OT Treatment Precautions/Restrictions  Precautions Precautions: Fall Restrictions Weight Bearing Restrictions: No   ADL ADL Grooming: Performed;Brushing hair;Set up Grooming Details (indicate cue type and reason): Pt took several rest breaks during grooming task due to fatigue Where Assessed - Grooming: Sitting, bed Toilet Transfer: Simulated;+2 Total assistance;Comment for patient % (40) Toilet Transfer Details (indicate cue type and reason): Sit to stand with +2 assit pt 40%. Pt pivoted with use of steady from bed to recliner and performed stand to sit with mod A Toilet Transfer Method: Other (comment) (steady lift) Equipment Used: Other (comment) (steady lift) Mobility  Bed Mobility Bed Mobility: Yes Rolling Left: 1: +2 Total assist;Other (comment);With rail (60%) Right Sidelying to Sit: 1: +2 Total assist;Patient percentage (comment);HOB elevated (comment degrees);With rails Right Sidelying to Sit Details (indicate cue type and reason): A to facilitate rolling of trunk and shoulders. Cues for positioning. A to push up at shoulders and trunk and positioning of  LEs Sitting - Scoot to Edge of Bed: 4: Min assist Sitting - Scoot to Edge of Bed Details (indicate cue type and reason): Min A with pad for positioning.  Transfers Transfers: Yes Sit to Stand: 1: +2 Total assist;With upper extremity assist;From elevated surface;From bed;Patient percentage (comment) (40%) Sit to Stand Details (indicate cue type and reason): Pt performed sit to stand x4. Max VC and tactile cues for full upright posture. Pt performed sit to stand x2 with use of Steady. Stand to Sit: 3: Mod assist;To chair/3-in-1;With upper extremity assist Stand to Sit Details: Assist to control descent into recliner. Exercises    End of Session OT - End of Session Equipment Utilized During Treatment: Gait belt Activity Tolerance: Patient limited by fatigue Patient left: in chair;with call bell in reach Nurse Communication: Mobility status for transfers General Behavior During Session: [facility] for tasks performed Cognition: [facility] for tasks performed  Cipriano Mile  04/13/2011, 11:24 AM 04/13/2011 Cipriano Mile OTR/L Pager [phone] Office [phone]

## 2011-04-13 NOTE — Progress Notes (Signed)
PARENTERAL NUTRITION CONSULT NOTE - FOLLOW UP  Pharmacy Consult for TPN Indication: prolonged ileus  Patient Measurements: Height: 5\' 3"  (160 cm) Weight: 218 lb (98.884 kg) IBW/kg (Calculated) : 52.4   Vital Signs: Temp: 98.2 F (36.8 C) (01/02 0630) BP: 112/75 mmHg (01/02 0630) Pulse Rate: 83  (01/02 0630)  Intake/Output from previous day: 01/01 0701 - 01/02 0700 In: 2690 [I.V.:876; IV Piggyback:160; TPN:1654] Out: 1602 [Urine:1500; Emesis/NG output:100; Stool:2]  Labs:  [facility] 04/13/11 0530 04/12/11 0500 04/11/11 0900 04/11/11 0400  WBC 27.9* 31.0* -- 24.5*  HGB 9.2* 8.9* -- 8.4*  HCT 31.4* 30.8* -- 28.3*  PLT 349 370 -- 318  APTT -- -- -- --  INR 1.04 1.24 1.33 --     Basename 04/13/11 0530 04/12/11 0500 04/11/11 0400  NA 140 142 143  K 3.2* 3.2* 3.7  CL 103 105 106  CO2 29 30 27   GLUCOSE 182* 212* 269*  BUN 57* 49* 44*  CREATININE 1.52* 1.57* 1.71*  LABCREA -- -- --  CREAT24HRUR -- -- --  CALCIUM 9.2 8.9 8.4  MG 2.2 2.0 2.1  PHOS 2.6 2.4 3.5  PROT -- -- 5.3*  ALBUMIN -- -- 1.8*  AST -- -- 10  ALT -- -- 10  ALKPHOS -- -- 54  BILITOT -- -- 0.2*  BILIDIR -- -- --  IBILI -- -- --  PREALBUMIN -- -- 9.0*  TRIG 336* -- 306*  CHOLHDL -- -- --  CHOL -- -- 107   Estimated Creatinine Clearance: 34.2 ml/min (by C-G formula based on Cr of 1.52).    Basename 04/13/11 0716 04/13/11 0410 04/12/11 2355  GLUCAP 157* 178* 257*   Insulin Requirements in the past 24 hours:  22 units of sliding scale insulin CBGs - greater than goal   Current Nutrition:  TPN Clinimix 5/15 at 55 ml/hr plus lipids at 7 ml/hr MWF   Assessment: Diverticulitis with abscess and ileus. Permissive underfeeding Kcal goals per ASPEN guidelines and RD recommendations. TPN started last night and since initiation CBGs have been uncontrolled. Noted that pt does have a history of DM and is also on methylprednisolone.CBGs trending down as Lantus increased and insulin in TPN.Last prealbumin  was 9. Triglycerides continue to increase.  Lipids not held since <400. Continue to follow triglycerides Mon, Thurs. (Only receiving lipids MWF d/t national backorder). Remains off propofol therapy. Electrolytes are trending down and will add lytes to TPN today.   Nutritional Goals:  1017-1187 kCal, 75-85 grams of protein per day (premix TPN will not allow [place] to meet her protein goals)  Plan:  1. Continue clinimix E5/15 at goal rate of 81ml/hr. 2. Check BMET, Mg, + Phos in AM 3. Continue to monitor triglycerides 4.  MVI, TE, lipids at 7 ml/hr MWF only 2/2 national [employer] L. Illene Bolus, PharmD, BCPS Clinical Pharmacist Pager: [phone] 04/13/2011 10:22 AM

## 2011-04-14 ENCOUNTER — Inpatient Hospital Stay (HOSPITAL_COMMUNITY): Payer: Medicare Other

## 2011-04-14 ENCOUNTER — Ambulatory Visit: Payer: Medicare Other

## 2011-04-14 ENCOUNTER — Encounter (HOSPITAL_COMMUNITY): Payer: Self-pay | Admitting: *Deleted

## 2011-04-14 DIAGNOSIS — R5381 Other malaise: Secondary | ICD-10-CM

## 2011-04-14 LAB — COMPREHENSIVE METABOLIC PANEL
Alkaline Phosphatase: 56 U/L (ref 39–117)
BUN: 58 mg/dL — ABNORMAL HIGH (ref 6–23)
Chloride: 105 mEq/L (ref 96–112)
GFR calc Af Amer: 33 mL/min — ABNORMAL LOW (ref >90)
Glucose, Bld: 110 mg/dL — ABNORMAL HIGH (ref 70–99)
Potassium: 3.5 mEq/L (ref 3.5–5.1)
Total Bilirubin: 0.2 mg/dL — ABNORMAL LOW (ref 0.3–1.2)

## 2011-04-14 LAB — CBC
Hemoglobin: 9.4 g/dL — ABNORMAL LOW (ref 12.0–15.0)
MCH: 26.9 pg (ref 26.0–34.0)
MCHC: 29.6 g/dL — ABNORMAL LOW (ref 30.0–36.0)
MCV: 91.1 fL (ref 78.0–100.0)
Platelets: 270 10*3/uL (ref 150–400)

## 2011-04-14 LAB — GLUCOSE, CAPILLARY
Glucose-Capillary: 114 mg/dL — ABNORMAL HIGH (ref 70–99)
Glucose-Capillary: 138 mg/dL — ABNORMAL HIGH (ref 70–99)

## 2011-04-14 LAB — MAGNESIUM: Magnesium: 2 mg/dL (ref 1.5–2.5)

## 2011-04-14 LAB — HEPARIN LEVEL (UNFRACTIONATED): Heparin Unfractionated: <0.1 IU/mL — ABNORMAL LOW (ref 0.30–0.70)

## 2011-04-14 LAB — CULTURE, BLOOD (ROUTINE X 2): Culture  Setup Time: 201212280137

## 2011-04-14 MED ORDER — INSULIN REGULAR HUMAN 100 UNIT/ML IJ SOLN
INTRAMUSCULAR | Status: AC
  Administered 2011-04-14: 18:00:00 via INTRAVENOUS
  Filled 2011-04-14: qty 2000

## 2011-04-14 MED ORDER — INSULIN GLARGINE 100 UNIT/ML ~~LOC~~ SOLN
10.0000 [IU] | Freq: Every day | SUBCUTANEOUS | Status: DC
  Administered 2011-04-14 – 2011-04-15 (×2): 10 [IU] via SUBCUTANEOUS
  Filled 2011-04-14: qty 3

## 2011-04-14 MED ORDER — POTASSIUM CHLORIDE 10 MEQ/100ML IV SOLN
10.0000 meq | INTRAVENOUS | Status: AC
  Administered 2011-04-14 (×4): 10 meq via INTRAVENOUS
  Filled 2011-04-14 (×4): qty 100

## 2011-04-14 NOTE — Consult Note (Signed)
Physical Medicine and Rehabilitation Consult Reason for Consult: Physical deconditioning Referring Phsyician: [facility] LATRESHIA BEAUCHAINE is an 78 y.o. female.   HPI: 78 year old right-handed female with history of atrial fibrillation with Coumadin therapy, chronic obstructive pulmonary disease with home oxygen therapy, congestive heart failure and chronic renal insufficiency with baseline 2.5. Admitted December 22 with abdominal discomfort mainly to the right lower quadrant and associated nausea. She denied any diarrhea or fever.  CT of the abdomen revealed acute diffuse diverticulitis. Seen by general surgery and initially placed on TPN for nutritional support and later changed a nasogastric tube feeds. Diet slowly advanced as tolerated nasogastric tube central removed. Question plan resume Coumadin therapy for atrial fibrillation. Latest physical therapy notes patient is +2 total assist to roll to the left very deconditioned as well as total assist for sit to stand moderate assist for stand to sit with ambulation yet to be tested. Physical medicine and rehabilitation was consulted at the request of physical therapy due to profound deconditioning to consider inpatient rehabilitation services Review of Systems  Constitutional: Positive for malaise/fatigue.  Eyes: Negative for double vision.  Respiratory: Positive for shortness of breath. Negative for cough.   Cardiovascular: Negative for chest pain.  Gastrointestinal: Negative for nausea and blood in stool.  Genitourinary: Negative for dysuria.  Musculoskeletal: Negative for myalgias.  Skin: Negative.   Neurological: Negative for dizziness and headaches.  Psychiatric/Behavioral: Negative.    Past Medical History  Diagnosis Date  . Diabetes mellitus   . Obesity   . COPD (chronic obstructive pulmonary disease)     Moderate. PFTs (12/10): FVC 67%, FEV1 58%, ratio 60%, TLC 95%, RV 138%, DLCO 43% (moderate obstructive defect). She is on  home oxygen that should be worn at all times.  . Diastolic CHF, chronic     Echo (3/12): EF 55-60%, mild LV hypertrophy, grade I diastolic dysfunction, mild left atrial enlargement, normal pulmonary artery pressure  . CKD (chronic kidney disease)     Cr 2.4 when last checked. 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 PNA (pneumonia)     h/o with ARDS in 2005; MRSA PNA with prolonged hospitalization in [facility] 11/11  . Diverticulum     Jejunal  . OSA (obstructive sleep apnea)     possible. When pt was sedated for TEE in May 2010, she did develop some upper airway obstruction  . Anemia     of chronic disease  . Hypothyroidism   . Gout   . [place]ath-induced atrial fibrillation 07/2008    Failed cardioversion with TEE guidance in May 2010. Pt went back to NSR by 11/2008 with amiodarne  . Hypertension   . GERD (gastroesophageal reflux disease)   . Peripheral vascular disease   . Pneumonia   . Chronic kidney disease    Past Surgical History  Procedure Date  . Tracheostomy     ARDS/ ICU admission, trach 2005  . Microperforation 08/2008    Of jejunem, ICU admission   Family History  Problem Relation Age of Onset  . Emphysema Mother     and asthma  . Asthma Mother   . Heart disease Neg Hx     premature   Social History:  reports that she quit smoking about 43 years ago. She does not have any smokeless tobacco history on file. She reports that she does not drink alcohol or use illicit drugs. Allergies:  Allergies  Allergen Reactions  . Sulfonamide Derivatives     Childhood reaction  Medications Prior to Admission  Medication Dose Route Frequency Provider Last Rate Last Dose  . 0.9 %  sodium chloride infusion   Intravenous Continuous Anders Simmonds, NP 20 mL/hr at 04/12/11 2109 20 mL/hr at 04/12/11 2109  . acetaminophen (TYLENOL) suppository 650 mg  650 mg Rectal Q6H PRN Carlota Raspberry, MD      . albuterol (PROVENTIL HFA;VENTOLIN HFA) 108 (90 BASE)  MCG/ACT inhaler 2 puff  2 puff Inhalation BID Mcarthur Rossetti. Feinstein   2 puff at 04/14/11 0950  . antiseptic oral rinse (BIOTENE) solution 15 mL  15 mL Mouth Rinse QID Lonia Farber, MD   15 mL at 04/14/11 0400  . calcium gluconate 1 g in sodium chloride 0.9 % 100 mL IVPB  1 g Intravenous Once Flint Melter, MD   1 g at 04/02/11 1813  . calcium gluconate 10 % injection           . cefTRIAXone (ROCEPHIN) 1 g in dextrose 5 % 50 mL IVPB  1 g Intravenous Once Flint Melter, MD   1 g at 04/02/11 2244  . Chlorhexidine Gluconate Cloth 2 % PADS 6 each  6 each Topical Q0600 Carlota Raspberry, MD   6 each at 04/07/11 0950  . Chlorhexidine Gluconate Cloth 2 % PADS 6 each  6 each Topical Q0600 Mcarthur Rossetti. Feinstein   6 each at 04/12/11 0545  . ciprofloxacin (CIPRO) IVPB 400 mg  400 mg Intravenous NOW Colleen Can, PHARMD   400 mg at 04/03/11 0045  . dextrose 50 % solution 25 mL  25 mL Intravenous Once Flint Melter, MD   25 mL at 04/02/11 1807  . diphenhydrAMINE (BENADRYL) injection 25 mg  25 mg Intravenous Q6H PRN Calvert Cantor, MD   25 mg at 04/13/11 2154  . etomidate (AMIDATE) injection 10 mg  10 mg Intravenous Once Anders Simmonds, NP   10 mg at 04/07/11 1745  . fat emulsion 20 % infusion 168 mL  168 mL Intravenous Continuous TPN Drake Leach Rumbarger, PHARMD 7 mL/hr at 04/11/11 2300 168 mL at 04/11/11 2300  . fat emulsion 20 % infusion 250 mL  250 mL Intravenous Continuous TPN Drusilla Kanner, PHARMD 7 mL/hr at 04/13/11 1730 250 mL at 04/13/11 1730  . fentaNYL (SUBLIMAZE) injection 100 mcg  100 mcg Intravenous Once Anders Simmonds, NP   100 mcg at 04/07/11 1900  . fluticasone (FLOVENT HFA) 110 MCG/ACT inhaler 2 puff  2 puff Inhalation BID Wesam Yacoub   2 puff at 04/14/11 0948  . furosemide (LASIX) injection 20 mg  20 mg Intravenous BID Preetha Joseph   20 mg at 04/14/11 0800  . furosemide (LASIX) injection 40 mg  40 mg Intravenous Q8H Anders Simmonds, NP   40 mg at 04/10/11 0031  . heparin ADULT  infusion 100 units/ml (25000 units/250 ml)  1,650 Units/hr Intravenous Continuous Severiano Gilbert, PHARMD 16.5 mL/hr at 04/14/11 0929 1,650 Units/hr at 04/14/11 0929  . HYDROmorphone (DILAUDID) injection 0.5 mg  0.5 mg Intravenous Once Flint Melter, MD   0.5 mg at 04/02/11 1643  . insulin aspart (novoLOG) injection 0-15 Units  0-15 Units Subcutaneous Q4H Calvert Cantor, MD   3 Units at 04/13/11 1726  . insulin aspart (novoLOG) injection 5 Units  5 Units Subcutaneous Once Flint Melter, MD   5 Units at 04/02/11 1810  . insulin glargine (LANTUS) injection 10 Units  10 Units Subcutaneous QHS Gildardo Cranker [place], [place]      .  ipratropium (ATROVENT HFA) inhaler 2 puff  2 puff Inhalation BID Mcarthur Rossetti. Feinstein   2 puff at 04/14/11 0951  . LORazepam (ATIVAN) tablet 0.5 mg  0.5 mg Sublingual QHS Maren Reamer, NP   0.5 mg at 04/13/11 2155  . midazolam (VERSED) 2 MG/2ML injection           . midazolam (VERSED) injection 2 mg  2 mg Intravenous Once Anders Simmonds, NP   2 mg at 04/07/11 1745  . morphine 2 MG/ML injection 2 mg  2 mg Intravenous Q2H PRN Shan Levans, MD   2 mg at 04/12/11 0026  . mupirocin ointment (BACTROBAN) 2 % 1 application  1 application Nasal BID Carlota Raspberry, MD   1 application at 04/07/11 0950  . mupirocin ointment (BACTROBAN) 2 % 1 application  1 application Nasal BID Mcarthur Rossetti. Feinstein   1 application at 04/14/11 1120  . ondansetron (ZOFRAN) injection 4 mg  4 mg Intravenous Once Flint Melter, MD   4 mg at 04/02/11 1643  . pantoprazole (PROTONIX) injection 40 mg  40 mg Intravenous Q24H Billy Fischer, MD   40 mg at 04/13/11 2200  . phytonadione (VITAMIN K) SQ injection 10 mg  10 mg Subcutaneous Once Leslye Peer, MD   10 mg at 04/08/11 1615  . phytonadione (VITAMIN K) SQ injection 10 mg  10 mg Subcutaneous Once Anders Simmonds, NP   10 mg at 04/09/11 1242  . piperacillin-tazobactam (ZOSYN) IVPB 3.375 g  3.375 g Intravenous Q8H Juliette [name], PHARMD   3.375 g at  04/14/11 0600  . potassium chloride 10 mEq in 100 mL IVPB  10 mEq Intravenous Q1 Hr x 4 Drusilla Kanner, PHARMD   10 mEq at 04/13/11 1500  . potassium chloride 10 mEq in 100 mL IVPB  10 mEq Intravenous Q1 Hr x 4 Drusilla Kanner, PHARMD   10 mEq at 04/14/11 1120  . rocuronium (ZEMURON) injection 58.8 mg  0.6 mg/kg Intravenous Once Anders Simmonds, NP   50 mg at 04/07/11 1745  . sodium chloride 0.9 % bolus 500 mL  500 mL Intravenous Once Calvert Cantor, MD   500 mL at 04/03/11 1745  . sodium chloride 0.9 % injection 10 mL  10 mL Intracatheter Q12H Calvert Cantor, MD   10 mL at 04/13/11 0045  . sodium chloride 0.9 % injection 10 mL  10 mL Intracatheter PRN Calvert Cantor, MD   10 mL at 04/14/11 0834  . sodium chloride 0.9 % injection        20 mL at 04/09/11 1614  . sodium chloride 0.9 % injection        30 mL at 04/10/11 1819  . sodium chloride 0.9 % injection        30 mL at 04/10/11 2000  . tpn solution (CLINIMIX 5/15) 1,000 mL infusion   Intravenous Continuous TPN Madolyn Frieze, PHARMD      . tpn solution [facility] 5/15) 1,000 mL infusion   Intravenous Continuous TPN Madolyn Frieze, PHARMD 40 mL/hr at 04/10/11 1833    . tpn solution [facility] 5/15) 2,000 mL with multivitamins adult 10 mL, trace elements Cr-Cu-Mn-Se-Zn 1 mL, insulin regular 10 Units infusion   Intravenous Continuous TPN Drake Leach Rumbarger, PHARMD 55 mL/hr at 04/11/11 1745    . tpn solution (CLINIMIX E 5/15) 2,000 mL with insulin regular 10 Units infusion   Intravenous Continuous TPN Lora Poteet Seay, PHARMD 55 mL/hr at 04/12/11 1750    .  tpn solution (CLINIMIX E 5/15) 2,000 mL with multivitamins adult 10 mL, trace elements Cr-Cu-Mn-Se-Zn 1 mL, insulin regular 10 Units infusion   Intravenous Continuous TPN Drusilla Kanner, PHARMD 55 mL/hr at 04/13/11 1730    . vancomycin (VANCOCIN) 1,500 mg in sodium chloride 0.9 % 500 mL IVPB  1,500 mg Intravenous Once Elpidio Eric McClung   1,500 mg at 04/06/11 1143  .  warfarin (COUMADIN) tablet 1 mg  1 mg Oral ONCE-1800 [facility] [place], [place]   1 mg at 04/04/11 1804  . warfarin (COUMADIN) tablet 2 mg  2 mg Oral ONCE-1800 Memory Argue Sucic, PHARMD   2 mg at 04/05/11 1712  . warfarin (COUMADIN) tablet 2 mg  2 mg Oral ONCE-1800 Elpidio Eric McClung   2 mg at 04/06/11 1729  . DISCONTD: 0.9 %  sodium chloride infusion   Intravenous Continuous Elpidio Eric McClung 75 mL/hr at 04/05/11 1191    . DISCONTD: acetaminophen (TYLENOL) tablet 650 mg  650 mg Oral Q6H PRN Carlota Raspberry, MD      . DISCONTD: albuterol (PROVENTIL HFA;VENTOLIN HFA) 108 (90 BASE) MCG/ACT inhaler 2 puff  2 puff Inhalation Q4H PRN Carlota Raspberry, MD      . DISCONTD: albuterol (PROVENTIL HFA;VENTOLIN HFA) 108 (90 BASE) MCG/ACT inhaler 2 puff  2 puff Inhalation QID Wesam Yacoub   2 puff at 04/11/11 1034  . DISCONTD: albuterol (PROVENTIL HFA;VENTOLIN HFA) 108 (90 BASE) MCG/ACT inhaler 6 puff  6 puff Inhalation QID Wesam Yacoub   6 puff at 04/10/11 1518  . DISCONTD: albuterol (PROVENTIL) (5 MG/ML) 0.5% nebulizer solution 2.5 mg  2.5 mg Nebulization Q6H Carlota Raspberry, MD   2.5 mg at 04/03/11 0737  . DISCONTD: albuterol (PROVENTIL) (5 MG/ML) 0.5% nebulizer solution 2.5 mg  2.5 mg Nebulization Q2H PRN Carlota Raspberry, MD   2.5 mg at 04/06/11 0526  . DISCONTD: albuterol (PROVENTIL) (5 MG/ML) 0.5% nebulizer solution 2.5 mg  2.5 mg Nebulization QID Tinnie Gens T McClung   2.5 mg at 04/08/11 1227  . DISCONTD: albuterol (PROVENTIL) (5 MG/ML) 0.5% nebulizer solution           . DISCONTD: amLODipine (NORVASC) tablet 10 mg  10 mg Oral Daily Calvert Cantor, MD   10 mg at 04/05/11 1212  . DISCONTD: antiseptic oral rinse (BIOTENE) solution 15 mL  15 mL Mouth Rinse PRN [name]      . DISCONTD: budesonide (PULMICORT) nebulizer solution 0.25 mg  0.25 mg Nebulization BID Elpidio Eric McClung   0.25 mg at 04/07/11 1925  . DISCONTD: chlorhexidine (PERIDEX) 0.12 % solution 15 mL  15 mL Mouth/Throat QID Wesam Yacoub   15 mL at 04/08/11 0425  .  DISCONTD: chlorhexidine (PERIDEX) 0.12 % solution 15 mL  15 mL Mouth/Throat BID Lonia Farber, MD      . DISCONTD: chlorhexidine (PERIDEX) 0.12 % solution 15 mL  15 mL Mouth/Throat BID Lonia Farber, MD   15 mL at 04/09/11 0814  . DISCONTD: ciprofloxacin (CIPRO) IVPB 400 mg  400 mg Intravenous Q24H Colleen Can, PHARMD   400 mg at 04/05/11 2358  . DISCONTD: fenofibrate tablet 54 mg  54 mg Oral Daily Carlota Raspberry, MD   54 mg at 04/07/11 0950  . DISCONTD: fentaNYL (SUBLIMAZE) injection 100 mcg  100 mcg Intravenous Q2H PRN Anders Simmonds, NP   100 mcg at 04/08/11 1531  . DISCONTD: Fluticasone-Salmeterol (ADVAIR) 250-50 MCG/DOSE inhaler 1 puff  1 puff Inhalation BID Janice Coffin, RPH   1 puff at 04/04/11  0901  . DISCONTD: furosemide (LASIX) 10 MG/ML injection           . DISCONTD: furosemide (LASIX) injection 40 mg  40 mg Intravenous Q12H Calvert Cantor, MD   40 mg at 04/05/11 2348  . DISCONTD: furosemide (LASIX) injection 60 mg  60 mg Intravenous Q12H Allison L. Rennis Harding, NP   60 mg at 04/06/11 2135  . DISCONTD: insulin aspart (novoLOG) injection 0-5 Units  0-5 Units Subcutaneous QHS Calvert Cantor, MD      . DISCONTD: insulin aspart (novoLOG) injection 0-9 Units  0-9 Units Subcutaneous TID WC Calvert Cantor, MD   2 Units at 04/07/11 0646  . DISCONTD: insulin aspart (novoLOG) injection 0-9 Units  0-9 Units Subcutaneous Q4H Allison L. Rennis Harding, NP   5 Units at 04/11/11 0423  . DISCONTD: insulin glargine (LANTUS) injection 10 Units  10 Units Subcutaneous QHS Mcarthur Rossetti. Feinstein   10 Units at 04/11/11 2113  . DISCONTD: insulin glargine (LANTUS) injection 15 Units  15 Units Subcutaneous QHS Calvert Cantor, MD   15 Units at 04/13/11 2155  . DISCONTD: insulin glargine (LANTUS) injection 5 Units  5 Units Subcutaneous QHS Calvert Cantor, MD      . DISCONTD: ipratropium (ATROVENT HFA) inhaler 2 puff  2 puff Inhalation Q4H Wesam Yacoub      . DISCONTD: ipratropium (ATROVENT HFA) inhaler 2 puff   2 puff Inhalation QID Wesam Yacoub   2 puff at 04/11/11 1035  . DISCONTD: ipratropium (ATROVENT HFA) inhaler 6 puff  6 puff Inhalation QID Wesam Yacoub   6 puff at 04/10/11 1518  . DISCONTD: ipratropium (ATROVENT) 0.02 % nebulizer solution           . DISCONTD: ipratropium (ATROVENT) nebulizer solution 0.5 mg  0.5 mg Nebulization QID Tinnie Gens T McClung   0.5 mg at 04/08/11 1227  . DISCONTD: LORazepam (ATIVAN) 2 MG/ML concentrated solution 0.5 mg  0.5 mg Oral QHS Calvert Cantor, MD      . DISCONTD: methylPREDNISolone sodium succinate (SOLU-MEDROL) 40 MG injection 40 mg  40 mg Intravenous Daily Anders Simmonds, NP   40 mg at 04/12/11 0938  . DISCONTD: metroNIDAZOLE (FLAGYL) IVPB 500 mg  500 mg Intravenous Q8H Carlota Raspberry, MD   500 mg at 04/06/11 0758  . DISCONTD: Mometasone Furo-Formoterol Fum 200-5 MCG/ACT AERO 2 puff  2 puff Inhalation BID Carlota Raspberry, MD      . DISCONTD: morphine 2 MG/ML injection 2 mg  2 mg Intravenous Q4H PRN Carlota Raspberry, MD   2 mg at 04/03/11 2345  . DISCONTD: ondansetron (ZOFRAN) injection 4 mg  4 mg Intravenous Q6H PRN Carlota Raspberry, MD   4 mg at 04/06/11 1018  . DISCONTD: ondansetron (ZOFRAN) tablet 4 mg  4 mg Oral Q6H PRN Carlota Raspberry, MD   4 mg at 04/04/11 2346  . DISCONTD: oxyCODONE (Oxy IR/ROXICODONE) immediate release tablet 5-10 mg  5-10 mg Oral Q4H PRN Elpidio Eric McClung   10 mg at 04/05/11 0847  . DISCONTD: piperacillin-tazobactam (ZOSYN) IVPB 2.25 g  2.25 g Intravenous Q6H Jeffrey T McClung   2.25 g at 04/10/11 1610  . DISCONTD: propofol (DIPRIVAN) 10 MG/ML infusion  5-70 mcg/kg/min Intravenous Continuous Anders Simmonds, NP 1.8 mL/hr at 04/09/11 0900 3 mcg/kg/min at 04/09/11 0900  . DISCONTD: simvastatin (ZOCOR) tablet 20 mg  20 mg Oral QHS Carlota Raspberry, MD   20 mg at 04/06/11 2135  . DISCONTD: sodium polystyrene (KAYEXALATE) 15 GM/60ML suspension 30 g  30 g Oral Once Carlota Raspberry,  MD      . DISCONTD: tiotropium (SPIRIVA) inhalation capsule 18 mcg  18 mcg Inhalation Daily Carlota Raspberry, MD    18 mcg at 04/04/11 0902  . DISCONTD: vancomycin (VANCOCIN) IVPB 1000 mg/200 mL premix  1,000 mg Intravenous Q24H Tinnie Gens T McClung   1,000 mg at 04/10/11 1140   Medications Prior to Admission  Medication Sig Dispense Refill  . albuterol (PROVENTIL) (2.5 MG/3ML) 0.083% nebulizer solution Take 3 mLs (2.5 mg total) by nebulization every 6 (six) hours as needed for wheezing.  75 mL  12  . albuterol (VENTOLIN HFA) 108 (90 BASE) MCG/ACT inhaler Inhale 2 puffs into the lungs every 4 (four) hours as needed for wheezing.  1 Inhaler  5  . fenofibrate (TRICOR) 48 MG tablet Take 48 mg by mouth daily.       . magnesium oxide (MAG-OX) 400 MG tablet Take 400 mg by mouth 3 (three) times daily.        . Mometasone Furo-Formoterol Fum (DULERA) 200-5 MCG/ACT AERO Inhale 2 puffs into the lungs 2 (two) times daily.  1 Inhaler  0  . ranitidine (ZANTAC) 150 MG capsule Take 150 mg by mouth every evening.        . simvastatin (ZOCOR) 20 MG tablet Take 1 tablet (20 mg total) by mouth at bedtime.  90 tablet  3  . tiotropium (SPIRIVA) 18 MCG inhalation capsule Place 1 capsule (18 mcg total) into inhaler and inhale daily.  30 capsule  11  . triamcinolone (KENALOG) 0.1 % cream Apply topically 2 (two) times daily.  454 g  1  . furosemide (LASIX) 40 MG tablet Take 40 mg by mouth 2 (two) times daily.        . NON FORMULARY OXYGEN. 2 liters at bedtime and as needed during the day.       . predniSONE (DELTASONE) 20 MG tablet Take 40 mg by mouth daily. Leftover from previous course       . warfarin (COUMADIN) 2 MG tablet TAKE ONE TABLET BY MOUTH EVERY DAY AS DIRECTED  30 tablet  5    Home: Home Living Lives With: Sheran Spine Help From: Family Type of Home: House Home Layout: One level Home Access: Stairs to enter Entrance Stairs-Rails: Right;Left;Can reach both Entrance Stairs-Number of Steps: 4 Bathroom Shower/Tub: Forensic scientist: Standard Bathroom Accessibility: Yes How Accessible: Accessible  via walker Home Adaptive Equipment: Dan Humphreys - four wheeled;Wheelchair - manual;Tub transfer bench  Functional History: Prior Function Level of Independence: Independent with basic ADLs;Independent with transfers;Requires assistive device for independence;Needs assistance with homemaking Able to Take Stairs?: Yes Driving: No Vocation: Retired Comments: plans to go home with daughter; house, one level with no steps to enter, will have 24 hour assist at home Functional Status:  Mobility: Bed Mobility Bed Mobility: Yes Rolling Right: 2: Max assist Rolling Right Details (indicate cue type and reason): use of pad to assist and initiate rolling  Rolling Left: 1: +2 Total assist;Other (comment);With rail (60%) Rolling Left Details (indicate cue type and reason): Assist to facilitate rotation of trunk at shoulders and hips with assist to right UE to reach across body.  Cues for sequence.  Limited by pain. Right Sidelying to Sit: 1: +2 Total assist;Patient percentage (comment);HOB elevated (comment degrees);With rails Right Sidelying to Sit Details (indicate cue type and reason): A to facilitate rolling of trunk and shoulders. Cues for positioning. A to push up at shoulders and trunk and positioning of LEs Supine to Sit: 2:  Max assist Supine to Sit Details (indicate cue type and reason): max for sequencing and follow through Sitting - Scoot to Edge of Bed: 4: Min assist Sitting - Scoot to Edge of Bed Details (indicate cue type and reason): Min A with pad for positioning.  Sit to Supine - Left: 1: +2 Total assist Sit to Supine - Left Details (indicate cue type and reason): +3totalpt30-40% with assist to elevate legs and reposition; once in bed pt having difficulty shifting hips needing facilitation with pad to get her better positioned Scooting to [facility]: 1: +2 Total assist Scooting to [facility] Details (indicate cue type and reason): +2totalpt10% with use of pad to scoot Transfers Transfers: Yes Sit to  Stand: 1: +2 Total assist;With upper extremity assist;From elevated surface;From bed;Patient percentage (comment) (40%) Sit to Stand Details (indicate cue type and reason): Pt performed sit to stand x4. Max VC and tactile cues for full upright posture. Pt performed sit to stand x2 with use of Steady. Stand to Sit: 3: Mod assist;To chair/3-in-1;With upper extremity assist Stand to Sit Details: Assist to control descent into recliner. Stand Pivot Transfers: 1: +2 Total assist;Other (comment);From elevated surface (with steady) Stand Pivot Transfer Details (indicate cue type and reason): Pt postioned in Steady. Unable to ambulate todays session secondary to complaints of weakness and fatique Ambulation/Gait Ambulation/Gait: No Ambulation/Gait Assistance: 1: +2 Total assist Ambulation/Gait Assistance Details (indicate cue type and reason): pt took 4-5 side steps to the [facility] in the right direction with RW; pt performed this with +2totalpt40-50% needing cueing for upright posture, sequencing with RW; knees tending towards buckling during stepping given lower extremity weakness Assistive device: Rolling walker Stairs: No Wheelchair Mobility Wheelchair Mobility: No  ADL: ADL Eating/Feeding: Simulated;Set up Where Assessed - Eating/Feeding: Edge of bed Grooming: Performed;Brushing hair;Set up Grooming Details (indicate cue type and reason): Pt took several rest breaks during grooming task due to fatigue Where Assessed - Grooming: Sitting, bed Upper Body Bathing: Simulated;Chest;Right arm;Left arm;Abdomen;Moderate assistance Where Assessed - Upper Body Bathing: Sitting, bed Lower Body Bathing: Simulated;+1 Total assistance Where Assessed - Lower Body Bathing: Sit to stand from bed Upper Body Dressing: Performed;Minimal assistance Upper Body Dressing Details (indicate cue type and reason): with donning gown Where Assessed - Upper Body Dressing: Sitting, bed Lower Body Dressing: Performed;+1 Total  assistance Lower Body Dressing Details (indicate cue type and reason): With donning bilateral socks. Pt. able to bend knee in attempts to cross foot over opposite knee, but due to abdomen pt. unable to complete. Where Assessed - Lower Body Dressing: Supine, head of bed up Toilet Transfer: Simulated;+2 Total assistance;Comment for patient % (40) Toilet Transfer Details (indicate cue type and reason): Sit to stand with +2 assit pt 40%. Pt pivoted with use of steady from bed to recliner and performed stand to sit with mod A Toilet Transfer Method: Other (comment) (steady lift) Toileting - Clothing Manipulation: Simulated;Maximal assistance Where Assessed - Glass blower/designer Manipulation: Standing Toileting - Hygiene: Performed;+1 Total assistance Toileting - Hygiene Details (indicate cue type and reason): assist for thoroughness for completing hygiene of backside Where Assessed - Toileting Hygiene: Standing Tub/Shower Transfer: Not assessed Tub/Shower Transfer Method: Not assessed Equipment Used: Other (comment) (steady lift) ADL Comments: Pt. awaiting procedure and with need to return to bed after session. Pt with deconditioning and requiring assist for mobility and ADLs.  Cognition: Cognition Arousal/Alertness: Awake/alert Orientation Level: Oriented X4 Cognition Arousal/Alertness: Awake/alert Overall Cognitive Status: Appears within functional limits for tasks assessed Orientation Level: Oriented X4  Blood  pressure 142/69, pulse 81, temperature 98.1 F (36.7 C), temperature source Oral, resp. rate 18, height 5\' 3"  (1.6 m), weight 99.565 kg (219 lb 8 oz), SpO2 99.00%. Physical Exam  Constitutional: She is oriented to person, place, and time. She appears well-developed.  HENT:  Head: Normocephalic.  Neck: Neck supple. No thyromegaly present.  Cardiovascular: Normal rate.   Pulmonary/Chest: Effort normal. She has no wheezes.  Abdominal: She exhibits no distension. There is no  tenderness.  Musculoskeletal: She exhibits no edema.  Neurological: She is alert and oriented to person, place, and time.  Skin: Skin is warm and dry.  Psychiatric: She has a normal mood and affect.   motor strength is 4/5 in bilateral deltoids, biceps, triceps, and grip 3 minus/5 in bilateral hip flexion, knee extension and ankle dorsiflexion plantar flexion Sensation is intact to light touch in bilateral lower extremities  Results for orders placed during the hospital encounter of 04/02/11 (from the past 24 hour(s))  GLUCOSE, CAPILLARY     Status: Abnormal   Collection Time   04/13/11  4:01 PM      Component Value Range   Glucose-Capillary 150 (*) 70 - 99 (mg/dL)  GLUCOSE, CAPILLARY     Status: Abnormal   Collection Time   04/13/11  8:19 PM      Component Value Range   Glucose-Capillary 101 (*) 70 - 99 (mg/dL)  GLUCOSE, CAPILLARY     Status: Abnormal   Collection Time   04/14/11 12:19 AM      Component Value Range   Glucose-Capillary 114 (*) 70 - 99 (mg/dL)   Comment 1 Documented in Chart     Comment 2 Notify RN    GLUCOSE, CAPILLARY     Status: Abnormal   Collection Time   04/14/11  4:02 AM      Component Value Range   Glucose-Capillary 109 (*) 70 - 99 (mg/dL)   Comment 1 Documented in Chart     Comment 2 Notify RN    PROTIME-INR     Status: Normal   Collection Time   04/14/11  5:40 AM      Component Value Range   Prothrombin Time 14.5  11.6 - 15.2 (seconds)   INR 1.11  0.00 - 1.49   COMPREHENSIVE METABOLIC PANEL     Status: Abnormal   Collection Time   04/14/11  5:40 AM      Component Value Range   Sodium 143  135 - 145 (mEq/L)   Potassium 3.5  3.5 - 5.1 (mEq/L)   Chloride 105  96 - 112 (mEq/L)   CO2 29  19 - 32 (mEq/L)   Glucose, Bld 110 (*) 70 - 99 (mg/dL)   BUN 58 (*) 6 - 23 (mg/dL)   Creatinine, Ser 1.61 (*) 0.50 - 1.10 (mg/dL)   Calcium 8.9  8.4 - 09.6 (mg/dL)   Total Protein 5.6 (*) 6.0 - 8.3 (g/dL)   Albumin 2.0 (*) 3.5 - 5.2 (g/dL)   AST 10  0 - 37 (U/L)   ALT  10  0 - 35 (U/L)   Alkaline Phosphatase 56  39 - 117 (U/L)   Total Bilirubin 0.2 (*) 0.3 - 1.2 (mg/dL)   GFR calc non Af Amer 28 (*) >90 (mL/min)   GFR calc Af Amer 33 (*) >90 (mL/min)  MAGNESIUM     Status: Normal   Collection Time   04/14/11  5:40 AM      Component Value Range   Magnesium 2.0  1.5 - 2.5 (mg/dL)  PHOSPHORUS     Status: Normal   Collection Time   04/14/11  5:40 AM      Component Value Range   Phosphorus 3.2  2.3 - 4.6 (mg/dL)  CBC     Status: Abnormal   Collection Time   04/14/11  5:40 AM      Component Value Range   WBC 18.2 (*) 4.0 - 10.5 (K/uL)   RBC 3.49 (*) 3.87 - 5.11 (MIL/uL)   Hemoglobin 9.4 (*) 12.0 - 15.0 (g/dL)   HCT 96.0 (*) 45.4 - 46.0 (%)   MCV 91.1  78.0 - 100.0 (fL)   MCH 26.9  26.0 - 34.0 (pg)   MCHC 29.6 (*) 30.0 - 36.0 (g/dL)   RDW 09.8 (*) 11.9 - 15.5 (%)   Platelets 270  150 - 400 (K/uL)  HEPARIN LEVEL (UNFRACTIONATED)     Status: Abnormal   Collection Time   04/14/11  5:40 AM      Component Value Range   Heparin Unfractionated <0.10 (*) 0.30 - 0.70 (IU/mL)   Dg Chest Port 1 View  04/14/2011  *RADIOLOGY REPORT*  Clinical Data: Effusions, pneumonia  PORTABLE CHEST - 1 VIEW  Comparison: April 10, 2011  Findings: The left IJ central line and nasogastric tube have been removed.  The right IJ central line tip is stable at the cavoatrial junction.  Cardiomegaly and bilateral pleural effusions, left greater than right, appear stable. Retrocardiac infiltrate cannot be excluded.  IMPRESSION: No significant change in cardiomegaly and bilateral pleural effusions.  Question retrocardiac infiltrate.  Original Report Authenticated By: Brandon Melnick, M.D.    Assessment/Plan: Diagnosis: Deconditioning secondary to diverticulitis 1. Does the need for close, 24 hr/day medical supervision in concert with the patient's rehab needs make it unreasonable for this patient to be served in a less intensive setting? Yes and Potentially 2. Co-Morbidities requiring  supervision/potential complications: COPD, chronic renal failure, chronic congestive heart failure, atrial fibrillation 3. Due to bladder management, bowel management, skin/wound care, medication administration, pain management and patient education, does the patient require 24 hr/day rehab nursing? Potentially 4. Does the patient require coordinated care of a physician, rehab nurse,  to address physical and functional deficits in the context of the above medical diagnosis(es)? Potentially Addressing deficits in the following areas: bathing, dressing, endurance, locomotion, strength, toileting and transferring 5. Can the patient actively participate in an intensive therapy program of at least 3 hrs of therapy per day at least 5 days per week? Potentially 6. The potential for patient to make measurable gains while on inpatient rehab is good 7. Anticipated functional outcomes upon discharge from inpatients are supervision to min assist with mobility PT, supervision to min assist ADLs OT, not applicable SLP 8. Estimated rehab length of stay to reach the above functional goals is: 3 weeks 9. Does the patient have adequate social supports to accommodate these discharge functional goals? Potentially 10. Anticipated D/C setting: Home 11. Anticipated post D/C treatments: HH therapy 12. Overall Rehab/Functional Prognosis: good  RECOMMENDATIONS: This patient's condition is appropriate for continued rehabilitative care in the following setting: Potentially to CIR if off of total parenteral nutrition, tolerating therapy, and caregiver able to provide min assist 24 7 Patient has agreed to participate in recommended program. Yes Note that insurance prior authorization may be required for reimbursement for recommended care.  Comment: Rehabilitation RN will followup on therapy progress as well as on medical issues noted above.   ANGIULLI,DANIEL J. 04/14/2011

## 2011-04-14 NOTE — Progress Notes (Signed)
Physical Therapy Treatment Patient Details Name: VANNARY GREENING MRN: [id number] DOB: [birth year] Today's Date: 04/14/2011  PT Assessment/Plan  PT - Assessment/Plan Comments on Treatment Session: Pt. experienced an incident of runny stool causing her foot to slide and pt. sitting on floor.  No injury identified.  Pt. denies pain or discomfort.  Pt. expressed gratitude for staff help in this situation and was apologetic for this happening.  Reassurance and encouragement provided.                       PT Plan: Discharge plan remains appropriate (as long as she progresses medically) PT Frequency: Min 3X/week Follow Up Recommendations: Inpatient Rehab Equipment Recommended: Defer to next venue PT Goals  Acute Rehab PT Goals PT Goal: Supine/Side to Sit - Progress: Progressing toward goal PT Goal: Sit to Stand - Progress: Not Progressing  PT Treatment Precautions/Restrictions  Precautions Precautions: Fall Required Braces or Orthoses: No Restrictions Weight Bearing Restrictions: No Mobility (including Balance) Bed Mobility Bed Mobility: Yes Supine to Sit: 3: Mod assist Supine to Sit Details (indicate cue type and reason): vc's and manual assist to move to erect sitting position (upper body assist) Sitting - Scoot to Edge of Bed: 4: Min assist Sitting - Scoot to Edge of Bed Details (indicate cue type and reason): min assist with pad toward EOB Sit to Supine - Left: Not tested (comment) Transfers Transfers: Yes Sit to Stand: 1: +2 Total assist;Patient percentage (comment);From bed;With upper extremity assist (Pt. 50 %) Sit to Stand Details (indicate cue type and reason): cues to straighten knees to propel self upward.  Pt. began to have runny diarrhea stool.  Attempted to sit pt. back down on bed but the diarrhea caused her foot to begin to slip.  Pt. was unable to return to standing position and continued to slowly be lowered to floor by therapist and tech  without any impact.  Summoned  for additional help and with total of 4 people, pt. was assisted from floor to low recliner chair using sheet underneath her hips/legs as additional leverage.  Pt's position of right LE prevented use of lift from floor.  Pt. assisted back to  bed with maxi move.  Pt. cleaned of stool and made comfortable in bed.     Ambulation/Gait Ambulation/Gait: No    Exercise    End of Session PT - End of Session Equipment Utilized During Treatment: Gait belt Activity Tolerance: Patient limited by fatigue;Other (comment) (limited by runny stool which caused her to slip) Patient left: in bed;with call bell in reach;Other (comment) (Pt. pleasant, apologetic for incident. Denied pain/injury) Nurse Communication: Need for lift equipment (recommended use of maxi move ) General Behavior During Session: [facility] for tasks performed Cognition: [facility] for tasks performed  Ferman Hamming 04/14/2011, 3:15 PM Acute Rehabilitation Services [phone] (pager)

## 2011-04-14 NOTE — Progress Notes (Addendum)
ANTICOAGULATION CONSULT NOTE - Follow Up Consult  Pharmacy Consult for Heparin Indication: atrial fibrillation  Assessment: 78 y/o female patient receiving IV heparin for afib, heparin level therapeutic at 0.31 (has been stable on current rate), PLT stable. No bleeding or pump issues reported. Per MD note, SBO resolving and will likely restart coumadin in next day or two.  Pharmacy System-Based Medication Review: Infectious Disease: WBC down to 18.2 -->27.9, blood cx-neg, 12/22 Ecoli UTI-pan sensitive. On Zosyn D#8 for abdominal distenstion. Dose appropriate. Vanc d/c'd 12/30. Case mgmt says probably d/c home tomorrow? Pulmonary: Atelectasis L-lung, Effusion on R, on Flovent, Atrovent, Albuterol, SoluMedrol 40 daily FEN/GI/Endocrinology: TPN; CBGs 250-265 on Lantus 15 and mod SSI-- increasing lantus today. S/p CT abd today Renal: SCr improving, CrCl ~31, UOP ~0.7 cc/kg/hr. k low replaced Hematology/Oncology: Hgb down at 8.4, plts stable, no bleeding reported Best practices: PPI, IV heparin, home meds addressed  Goal of Therapy:  Heparin level 0.3-0.7 units/ml   Plan:  Continue IV heparin infusion at 1650 units/hr and recheck heparin level in AM.  Follow-up coumadin restart.  Bayard Beaver. Saul Fordyce, PharmD  04/14/2011 7:22 PM  Allergies  Allergen Reactions  . Sulfonamide Derivatives     Childhood reaction    Patient Measurements: Height: 5\' 3"  (160 cm) Weight: 219 lb 8 oz (99.565 kg) IBW/kg (Calculated) : 52.4    Vital Signs:    Labs:  Basename 04/14/11 1820 04/14/11 0540 04/13/11 0530 04/12/11 0500  HGB -- 9.4* 9.2* --  HCT -- 31.8* 31.4* 30.8*  PLT -- 270 349 370  APTT -- -- -- --  LABPROT -- 14.5 13.8 15.9*  INR -- 1.11 1.04 1.24  HEPARINUNFRC 0.31 <0.10* 0.56 --  CREATININE -- 1.67* 1.52* 1.57*  CKTOTAL -- -- -- --  CKMB -- -- -- --  TROPONINI -- -- -- --   Estimated Creatinine Clearance: 31.3 ml/min (by C-G formula based on Cr of 1.67).   Medications:    Infusions:     . sodium chloride 20 mL/hr (04/12/11 2109)  . fat emulsion 250 mL (04/13/11 1730)  . heparin 1,650 Units/hr (04/14/11 0929)  . TPN (CLINIMIX) +/- additives 55 mL/hr at 04/14/11 1758  . TPN (CLINIMIX) +/- additives 55 mL/hr at 04/13/11 1730

## 2011-04-14 NOTE — Progress Notes (Signed)
ANTICOAGULATION CONSULT NOTE - Follow Up Consult  Pharmacy Consult for Heparin Indication: atrial fibrillation  Allergies  Allergen Reactions  . Sulfonamide Derivatives     Childhood reaction    Patient Measurements: Height: 5\' 3"  (160 cm) Weight: 219 lb 8 oz (99.565 kg) IBW/kg (Calculated) : 52.4    Vital Signs: Temp: 98.1 F (36.7 C) (01/03 0604) BP: 142/69 mmHg (01/03 0604) Pulse Rate: 81  (01/03 0604)  Labs:  Basename 04/14/11 0540 04/13/11 0530 04/12/11 0500  HGB 9.4* 9.2* --  HCT 31.8* 31.4* 30.8*  PLT 270 349 370  APTT -- -- --  LABPROT 14.5 13.8 15.9*  INR 1.11 1.04 1.24  HEPARINUNFRC <0.10* 0.56 0.61  CREATININE 1.67* 1.52* 1.57*  CKTOTAL -- -- --  CKMB -- -- --  TROPONINI -- -- --   Estimated Creatinine Clearance: 31.3 ml/min (by C-G formula based on Cr of 1.67).   Medications:  Infusions:    . sodium chloride 20 mL/hr (04/12/11 2109)  . fat emulsion 250 mL (04/13/11 1730)  . heparin 1,650 Units/hr (04/14/11 0929)  . TPN (CLINIMIX) +/- additives 55 mL/hr at 04/12/11 1750  . TPN (CLINIMIX) +/- additives 55 mL/hr at 04/13/11 1730    Assessment: 78 y/o female patient receiving heparin gtt for afib, Xa level undetectable. No bleeding reported. RN reports IV pump beeping frequently. Patient has been therapeutic on 1650 units/hr for 3 days. Undetectable level most likely d/t pump malfunction.  Goal of Therapy:  Heparin level 0.3-0.7 units/ml   Plan:  Will continue heparin gtt at 1650 units/hr and recheck heparin level this evening. Consider resuming coumadin.  Verlene Mayer, PharmD, BCPS Pager [phone] 04/14/2011,12:51 PM

## 2011-04-14 NOTE — Progress Notes (Signed)
  Subjective: Pt feeling better. No N/V with NG out. Continued bowel function. Pain less. C/O itchy new rash on trunk, thinks it started after being wiped with sani-cloths. Objective: Vital signs in last 24 hours: Temp:  [97.8 F (36.6 C)-98.1 F (36.7 C)] 98.1 F (36.7 C) (01/03 0604) Pulse Rate:  [68-81] 81  (01/03 0604) Resp:  [18] 18  (01/03 0604) BP: (142-165)/(68-69) 142/69 mmHg (01/03 0604) SpO2:  [95 %-99 %] 99 % (01/03 0604) Weight:  [219 lb 8 oz (99.565 kg)] 219 lb 8 oz (99.565 kg) (01/03 0500) Last BM Date: 04/13/11  Intake/Output this shift:    Physical Exam: BP 142/69  Pulse 81  Temp(Src) 98.1 F (36.7 C) (Oral)  Resp 18  Ht 5\' 3"  (1.6 m)  Wt 219 lb 8 oz (99.565 kg)  BMI 38.88 kg/m2  SpO2 99% Abdomen: round and obese but soft, ND, NT Skin: fine diffuse macular rash on trunk, no vesicles. Labs: CBC  Basename 04/14/11 0540 04/13/11 0530  WBC 18.2* 27.9*  HGB 9.4* 9.2*  HCT 31.8* 31.4*  PLT 270 349   BMET  Basename 04/14/11 0540 04/13/11 0530  NA 143 140  K 3.5 3.2*  CL 105 103  CO2 29 29  GLUCOSE 110* 182*  BUN 58* 57*  CREATININE 1.67* 1.52*  CALCIUM 8.9 9.2   LFT  Basename 04/14/11 0540  PROT 5.6*  ALBUMIN 2.0*  AST 10  ALT 10  ALKPHOS 56  BILITOT 0.2*  BILIDIR --  IBILI --  LIPASE --   PT/INR  Basename 04/14/11 0540 04/13/11 0530  LABPROT 14.5 13.8  INR 1.11 1.04   ABG No results found for this basename: PHART:2,PCO2:2,PO2:2,HCO3:2 in the last 72 hours  Studies/Results: No results found.  Assessment: Principal Problem:  *Diverticulitis large intestine Active Problems:  DIABETES MELLITUS, TYPE II  Campath-induced atrial fibrillation  DIASTOLIC HEART FAILURE, CHRONIC  COPD  RENAL FAILURE, CHRONIC  Fever  Bradycardia  Atelectasis  Hyperkalemia     Plan: Clears diet OOB Watch rash, only allergy hx id sulfonamides.Marland KitchenMarland KitchenMarland KitchenBenadryl prn   LOS: 12 days    Marianna Fuss 04/14/2011

## 2011-04-14 NOTE — Progress Notes (Signed)
PARENTERAL NUTRITION CONSULT NOTE - FOLLOW UP  Pharmacy Consult for TPN Indication: prolonged ileus  Patient Measurements: Height: 5\' 3"  (160 cm) Weight: 219 lb 8 oz (99.565 kg) IBW/kg (Calculated) : 52.4   Vital Signs: Temp: 98.1 F (36.7 C) (01/03 0604) BP: 142/69 mmHg (01/03 0604) Pulse Rate: 81  (01/03 0604)  Intake/Output from previous day: 01/02 0701 - 01/03 0700 In: 865 [I.V.:198; TPN:667] Out: 1750 [Urine:1750]  Labs:  [facility] 04/14/11 0540 04/13/11 0530 04/12/11 0500  WBC 18.2* 27.9* 31.0*  HGB 9.4* 9.2* 8.9*  HCT 31.8* 31.4* 30.8*  PLT 270 349 370  APTT -- -- --  INR 1.11 1.04 1.24     Basename 04/14/11 0540 04/13/11 0530 04/12/11 0500  NA 143 140 142  K 3.5 3.2* 3.2*  CL 105 103 105  CO2 29 29 30   GLUCOSE 110* 182* 212*  BUN 58* 57* 49*  CREATININE 1.67* 1.52* 1.57*  LABCREA -- -- --  CREAT24HRUR -- -- --  CALCIUM 8.9 9.2 8.9  MG 2.0 2.2 2.0  PHOS 3.2 2.6 2.4  PROT 5.6* -- --  ALBUMIN 2.0* -- --  AST 10 -- --  ALT 10 -- --  ALKPHOS 56 -- --  BILITOT 0.2* -- --  BILIDIR -- -- --  IBILI -- -- --  PREALBUMIN -- -- --  TRIG -- 336* --  CHOLHDL -- -- --  CHOL -- -- --   Estimated Creatinine Clearance: 31.3 ml/min (by C-G formula based on Cr of 1.67).    Basename 04/14/11 0402 04/14/11 0019 04/13/11 2019  GLUCAP 109* 114* 101*   Insulin Requirements in the past 24 hours:  zero units of sliding scale insulin CBGs - well controlled  Current Nutrition:  TPN Clinimix 5/15 at 55 ml/hr plus lipids at 7 ml/hr MWF   Assessment: Diverticulitis with abscess and ileus. Permissive underfeeding Kcal goals per ASPEN guidelines and RD recommendations. TPN started last night and since initiation CBGs have been uncontrolled. Noted that pt does have a history of DM and is also on methylprednisolone.  Last prealbumin was 9. Triglycerides continue to increase.  Lipids not held since <400. Continue to follow triglycerides Mon, Thurs. (Only receiving lipids  MWF d/t national backorder). Remains off propofol therapy.  Nutritional Goals:  1017-1187 kCal, 75-85 grams of protein per day (premix TPN will not allow [place] to meet her protein goals)  Plan:  1. Continue clinimix E5/15 at goal rate of 93ml/hr. 2. Check BMET in am 3. Continue to monitor triglycerides qMon/Thurs 4.  MVI, TE, lipids at 7 ml/hr MWF only 2/2 national shortage 5.  Change CBG to q6h with improved control.  Back off on lantus with steroids d/c'd 04/12/11 and lower cbg 6.  Replete K with 4 k runs for goal > 4  Kensli Bowley L. Illene Bolus, PharmD, BCPS Clinical Pharmacist Pager: [phone] 04/14/2011 8:44 AM

## 2011-04-14 NOTE — Progress Notes (Signed)
   CARE MANAGEMENT NOTE 04/14/2011  Patient:  [name], [name]   Account Number:  [id number]  Date Initiated:  04/04/2011  Documentation initiated by:  Onnie Boer  Subjective/Objective Assessment:   PT WAS ADMITTED WITH DIVERTICULITIS     Action/Plan:   PROGRESSION OF CARE AND DISCHARGE PLANNING   Anticipated DC Date:  04/15/2011   Anticipated DC Plan:  HOME W HOME HEALTH SERVICES      DC Planning Services  CM consult      Choice offered to / List presented to:             Status of service:  In process, will continue to follow Medicare Important Message given?   (If response is "NO", the following Medicare IM given date fields will be blank) Date Medicare IM given:   Date Additional Medicare IM given:    Discharge Disposition:    Per UR Regulation:  Reviewed for med. necessity/level of care/duration of stay  Comments:  1/3 pt and ot rec inpt rehab, will ask md to ask for cir consult, debbie Caellum Mancil rn,bsn [phone]  04-08-11 7:35am Avie Arenas, RNBSN [phone] UR Completed.  tx to ICU - resp failure - intubated.  UR COMPLETED 04/04/2011 JENNIFER CLARK, RN,BSN [phone] PT WAS ADMITTED WITH DIVERTICULITIS AND HR IN THE 40'S.  PT WILL NEED PT AT DC, WILL F/U

## 2011-04-14 NOTE — Progress Notes (Signed)
Subjective: Feels better overall, breathing better, abdominal pain improving  Objective: Blood pressure 142/69, pulse 81, temperature 98.1 F (36.7 C), temperature source Oral, resp. rate 18, height 5\' 3"  (1.6 m), weight 99.565 kg (219 lb 8 oz), SpO2 99.00%. Weight change: 0.68 kg (1 lb 8 oz)  Intake/Output Summary (Last 24 hours) at 04/14/11 1740 Last data filed at 04/14/11 1500  Gross per 24 hour  Intake    286 ml  Output   3202 ml  Net  -2916 ml   Physical Exam: General appearance:  alert, cooperative, no distress.  Head: Normocephalic, without obvious abnormality, atraumatic   Lungs: Coarse to auscultation bilaterally, 3 L nasal cannula, respiratory effort shallow with diminished sounds in the bases, no tachypnea Heart: regular rate and rhythm, S1, S2 normal, no murmur, click, rub or gallop, IV fluids at keep open, IV heparin infusing Abdomen: distended, tympanic, soft, non tender, Bowel sounds present Extremities: extremities normal, atraumatic, trace edema Lab Results:  [facility] 04/14/11 0540 04/13/11 0530  NA 143 140  K 3.5 3.2*  CL 105 103  CO2 29 29  GLUCOSE 110* 182*  BUN 58* 57*  CREATININE 1.67* 1.52*  CALCIUM 8.9 9.2  MG 2.0 2.2  PHOS 3.2 2.6    Basename 04/14/11 0540 04/13/11 0530  WBC 18.2* 27.9*  NEUTROABS -- --  HGB 9.4* 9.2*  HCT 31.8* 31.4*  MCV 91.1 90.0  PLT 270 349  CT ABDOMEN AND PELVIS WITHOUT CONTRAST  IMPRESSION:  Decreased inflammation involving the proximal jejunal loops and  decreased small bowel distention.  There are approximately five small low density collections adjacent  to the jejunal loops in the mid abdomen. Findings are probably  related to focal fluid pockets but could represent phlegmonous  collections. Recommend follow-up to exclude early abscess  formations.  There is increased perihepatic ascites. Slightly decreased fluid  in the left paracolic gutter. Pleural effusions may have slightly  enlarged.  Cholelithiasis  with mild distention of the gallbladder.   Assessment/Plan:  Acute hypoxemic and hypercarbic respiratory failure/question healthcare acquired pneumonia Markedly improved. He is still requiring higher doses of oxygen and was utilizing at home but is in no acute distress.  Secondary to pleural effusions, and possible consolidation Continue Zosyn, continue low dose lasix BID, follow 1/0s,  wean O2  Acute diffuse Diverticlulitis of the jejunum  There was no evidence of perforation or abscess formation on the initial CT scan of the abdomen performed 04/03/2011.  WBC somewhat better,  CT yesterday reveals what may be developing abscesses. She is on Zosyn.  Per CCS,tolerating clears When does she need a repeat CT   Metabolic encephalopathy RESOLVED  Partial small bowel obstruction Clinically improving, tolerating liquid diet  Atrial fibrillation with Bradycardia  The patient's bradycardia was likely due to a medication response possibly worsened due to high vagal tone in the setting of persistent abdominal pain and nausea-her heart rate has improved to 60's and 70s upon holding her rate controlling medications, continues on IV heparin, restart coumadin in next day or 2 and slowly restart Po metoprolol  Hypotension  RESOLVED  Acute renal failure/ hyperkalemia  Marked improvement in renal function/creatinine. Potassium is normalized and creatinine is stable consistent with the patient's baseline.  DIABETES MELLITUS, improved, continue lantus, SSI  Chronic hypoxemic respiratory failure secondary to COPD  on home O2 continuously at 2 L per minute-diminished inspiratory effort related to abdominal pain is currently felt to be contributing to persistent hypoxia and increased oxygen needs which are stable  at this point.     LOS: 12 days   [name] [phone] 04/14/2011, 5:40 PM

## 2011-04-15 LAB — CBC
HCT: 31.1 % — ABNORMAL LOW (ref 36.0–46.0)
Hemoglobin: 9.1 g/dL — ABNORMAL LOW (ref 12.0–15.0)
RBC: 3.47 MIL/uL — ABNORMAL LOW (ref 3.87–5.11)
WBC: 18.1 10*3/uL — ABNORMAL HIGH (ref 4.0–10.5)

## 2011-04-15 LAB — GLUCOSE, CAPILLARY
Glucose-Capillary: 109 mg/dL — ABNORMAL HIGH (ref 70–99)
Glucose-Capillary: 117 mg/dL — ABNORMAL HIGH (ref 70–99)
Glucose-Capillary: 127 mg/dL — ABNORMAL HIGH (ref 70–99)
Glucose-Capillary: 291 mg/dL — ABNORMAL HIGH (ref 70–99)

## 2011-04-15 LAB — BASIC METABOLIC PANEL
CO2: 31 mEq/L (ref 19–32)
Chloride: 101 mEq/L (ref 96–112)
GFR calc non Af Amer: 28 mL/min — ABNORMAL LOW (ref >90)
Glucose, Bld: 111 mg/dL — ABNORMAL HIGH (ref 70–99)
Potassium: 3.7 mEq/L (ref 3.5–5.1)
Sodium: 139 mEq/L (ref 135–145)

## 2011-04-15 LAB — PROTIME-INR: INR: 1.14 (ref 0.00–1.49)

## 2011-04-15 MED ORDER — POTASSIUM CHLORIDE 10 MEQ/100ML IV SOLN
10.0000 meq | INTRAVENOUS | Status: AC
  Administered 2011-04-15 (×3): 10 meq via INTRAVENOUS
  Filled 2011-04-15 (×3): qty 100

## 2011-04-15 MED ORDER — COLCHICINE 0.6 MG PO TABS
0.6000 mg | ORAL_TABLET | Freq: Every day | ORAL | Status: AC
  Administered 2011-04-15 – 2011-04-16 (×2): 0.6 mg via ORAL
  Filled 2011-04-15 (×2): qty 1

## 2011-04-15 MED ORDER — INSULIN ASPART 100 UNIT/ML ~~LOC~~ SOLN
0.0000 [IU] | Freq: Four times a day (QID) | SUBCUTANEOUS | Status: DC
  Administered 2011-04-15 (×2): 2 [IU] via SUBCUTANEOUS
  Administered 2011-04-16: 5 [IU] via SUBCUTANEOUS
  Administered 2011-04-16: 11 [IU] via SUBCUTANEOUS
  Administered 2011-04-16: 5 [IU] via SUBCUTANEOUS
  Administered 2011-04-16: 3 [IU] via SUBCUTANEOUS
  Administered 2011-04-17: 2 [IU] via SUBCUTANEOUS
  Administered 2011-04-17: 3 [IU] via SUBCUTANEOUS
  Administered 2011-04-17: 2 [IU] via SUBCUTANEOUS
  Administered 2011-04-17 – 2011-04-18 (×2): 5 [IU] via SUBCUTANEOUS
  Administered 2011-04-18: 3 [IU] via SUBCUTANEOUS
  Administered 2011-04-18: 11 [IU] via SUBCUTANEOUS
  Administered 2011-04-18 – 2011-04-19 (×2): 2 [IU] via SUBCUTANEOUS
  Administered 2011-04-19: 3 [IU] via SUBCUTANEOUS

## 2011-04-15 MED ORDER — METRONIDAZOLE 500 MG PO TABS: 500.0000 mg | ORAL_TABLET | Freq: Three times a day (TID) | ORAL | Status: DC

## 2011-04-15 MED ORDER — METRONIDAZOLE 50 MG/ML ORAL SUSPENSION
500.0000 mg | Freq: Three times a day (TID) | ORAL | Status: DC
  Administered 2011-04-16 (×2): 500 mg via ORAL
  Filled 2011-04-15 (×4): qty 10

## 2011-04-15 MED ORDER — PREDNISONE 20 MG PO TABS
40.0000 mg | ORAL_TABLET | Freq: Every day | ORAL | Status: AC
  Administered 2011-04-15: 40 mg via ORAL
  Filled 2011-04-15: qty 2

## 2011-04-15 MED ORDER — BOOST / RESOURCE BREEZE PO LIQD
240.0000 mL | Freq: Three times a day (TID) | ORAL | Status: DC
  Administered 2011-04-15 – 2011-04-19 (×6): 1 via ORAL

## 2011-04-15 MED ORDER — FAT EMULSION 20 % IV EMUL
168.0000 mL | INTRAVENOUS | Status: AC
  Administered 2011-04-15: 168 mL via INTRAVENOUS
  Filled 2011-04-15: qty 200

## 2011-04-15 MED ORDER — TRACE MINERALS CR-CU-MN-SE-ZN 10-1000-500-60 MCG/ML IV SOLN
INTRAVENOUS | Status: AC
  Administered 2011-04-15: 18:00:00 via INTRAVENOUS
  Filled 2011-04-15: qty 2000

## 2011-04-15 NOTE — Progress Notes (Addendum)
ANTICOAGULATION CONSULT NOTE - Follow Up Consult  Pharmacy Consult for Heparin and Zosyn Indication: atrial fibrillation  Assessment: 78 y/o female patient receiving IV heparin for afib, heparin level therapeutic at 0.32 (has been stable on current rate), No bleeding or pump issues reported. Per MD note, SBO resolving and will likely restart coumadin in next day or two.  Patient on Zosyn day 9 for acute diffuse diverticulitiis of the jejunum.   Pharmacy System-Based Medication Review: Infectious Disease: WBC down to 18.1 -->27.9, blood cx-neg, 12/22 Ecoli UTI-pan sensitive. On Zosyn D#9 Dose appropriate. Vanc d/c'd 12/30. Case mgmt says probably d/c home tomorrow? Pulmonary: Atelectasis L-lung, Effusion on R, on Flovent, Atrovent, Albuterol,  FEN/GI/Endocrinology: TPN; on Lantus and mod SSI Renal: SCr improving, CrCl ~31, UOP ~0.7 cc/kg/hr. Hematology/Oncology:no bleeding reported Best practices: PPI, IV heparin, home meds addressed  Goal of Therapy:  Heparin level 0.3-0.7 units/ml   Plan:  Continue IV heparin infusion at 1650 units/hr and recheck heparin level in AM.  Continue Zosyn 3.375gm iv q8hrs.  Follow-up coumadin restart.  Wendie Simmer, PharmD, BCPS Clinical Pharmacist  Pager: [phone]    Allergies  Allergen Reactions  . Sulfonamide Derivatives     Childhood reaction    Patient Measurements: Height: 5\' 3"  (160 cm) Weight: 217 lb 6.4 oz (98.612 kg) IBW/kg (Calculated) : 52.4    Vital Signs: Temp: 98.7 F (37.1 C) (01/04 0554) BP: 143/68 mmHg (01/04 0554) Pulse Rate: 79  (01/04 0554)  Labs:  Basename 04/15/11 0558 04/14/11 1820 04/14/11 0540 04/13/11 0530  HGB 9.1* -- 9.4* --  HCT 31.1* -- 31.8* 31.4*  PLT 234 -- 270 349  APTT -- -- -- --  LABPROT 14.8 -- 14.5 13.8  INR 1.14 -- 1.11 1.04  HEPARINUNFRC 0.32 0.31 <0.10* --  CREATININE 1.66* -- 1.67* 1.52*  CKTOTAL -- -- -- --  CKMB -- -- -- --  TROPONINI -- -- -- --   Estimated Creatinine  Clearance: 31.3 ml/min (by C-G formula based on Cr of 1.66).   Medications:  Infusions:     . sodium chloride 20 mL/hr at 04/15/11 0615  . fat emulsion 250 mL (04/13/11 1730)  . heparin 16.5 mL/hr (04/15/11 0615)  . TPN (CLINIMIX) +/- additives 55 mL/hr at 04/15/11 0615  . TPN (CLINIMIX) +/- additives 55 mL/hr at 04/13/11 1730

## 2011-04-15 NOTE — Progress Notes (Signed)
Nutrition Follow-up  Pt extubated 12/29. NGT discontinued. Noted continues with abdominal pain, passing flatus.  Diet Order:  Clear Liquids  Re-estimated Nutrition Needs: 1,700-1,900 kcals, 75-85 gm protein  Patient is receiving TPN with Clinimix E 5/15 @ 55 ml/hr.  Lipids (20% IVFE @ 7 ml/hr), multivitamins, and trace elements are provided 3 times weekly (MWF) due to national backorder.  Provides 1081 kcal and 66 grams protein daily (based on weekly average).  Meets 63% minimum estimated kcal and 88% minimum estimated protein needs.  Meds: Scheduled Meds:   . albuterol  2 puff Inhalation BID  . antiseptic oral rinse  15 mL Mouth Rinse QID  . Chlorhexidine Gluconate Cloth  6 each Topical Q0600  . fluticasone  2 puff Inhalation BID  . furosemide  20 mg Intravenous BID  . insulin aspart  0-15 Units Subcutaneous Q6H  . insulin glargine  10 Units Subcutaneous QHS  . ipratropium  2 puff Inhalation BID  . LORazepam  0.5 mg Sublingual QHS  . mupirocin ointment  1 application Nasal BID  . pantoprazole (PROTONIX) IV  40 mg Intravenous Q24H  . piperacillin-tazobactam (ZOSYN)  IV  3.375 g Intravenous Q8H  . potassium chloride  10 mEq Intravenous Q1 Hr x 4  . potassium chloride  10 mEq Intravenous Q1 Hr x 3  . sodium chloride  10 mL Intracatheter Q12H  . DISCONTD: insulin aspart  0-15 Units Subcutaneous Q4H   Continuous Infusions:   . sodium chloride 20 mL/hr at 04/15/11 0615  . fat emulsion    . fat emulsion 250 mL (04/13/11 1730)  . heparin 16.5 mL/hr (04/15/11 0615)  . TPN (CLINIMIX) +/- additives 55 mL/hr at 04/15/11 0615  . TPN (CLINIMIX) +/- additives 55 mL/hr at 04/13/11 1730  . TPN (CLINIMIX) +/- additives     PRN Meds:.acetaminophen, diphenhydrAMINE, morphine injection, sodium chloride  Labs:  CMP     Component Value Date/Time   NA 139 04/15/2011 0558   K 3.7 04/15/2011 0558   CL 101 04/15/2011 0558   CO2 31 04/15/2011 0558   GLUCOSE 111* 04/15/2011 0558   BUN 51* 04/15/2011 0558   CREATININE 1.66* 04/15/2011 0558   CREATININE 2.65* 06/18/2010 1715   CALCIUM 8.7 04/15/2011 0558   PROT 5.6* 04/14/2011 0540   ALBUMIN 2.0* 04/14/2011 0540   AST 10 04/14/2011 0540   ALT 10 04/14/2011 0540   ALKPHOS 56 04/14/2011 0540   BILITOT 0.2* 04/14/2011 0540   GFRNONAA 28* 04/15/2011 0558   GFRAA 33* 04/15/2011 0558     Intake/Output Summary (Last 24 hours) at 04/15/11 1056 Last data filed at 04/15/11 0615  Gross per 24 hour  Intake 2607.63 ml  Output   4007 ml  Net -1399.37 ml    Weight Status:  98.6 kg (1/4) -- slightly down  Nutrition Dx:  Inadequate Oral Intake, ongoing  New Goal:  TPN to meet >90% of estimated protein needs, maximize energy provision as able during national lipid backorder, currently unmet Monitor: TPN prescription, PO intake, weight, labs, I/O's  Intervention:    TPN per pharmacy  Add Resource Breeze supplement to maximize PO intake  RD to follow for nutrition care plan   Alger Memos Pager #:  [phone]

## 2011-04-15 NOTE — Progress Notes (Signed)
  Subjective: Pt was feeling better until yesterday when multiple loose BMs began. She states they were loose but has not had any yet today. She still c/o diffuse crampy pain in abdomen. She is passing flatus. She has tolerated some clears, but states she is not hungry.  Objective: Vital signs in last 24 hours: Temp:  [98.4 F (36.9 C)-98.7 F (37.1 C)] 98.7 F (37.1 C) (01/04 0554) Pulse Rate:  [74-79] 79  (01/04 0554) Resp:  [18-20] 20  (01/04 0554) BP: (134-143)/(54-68) 143/68 mmHg (01/04 0554) SpO2:  [98 %-99 %] 98 % (01/04 0554) Weight:  [98.612 kg (217 lb 6.4 oz)] 217 lb 6.4 oz (98.612 kg) (01/04 0500) Last BM Date: 04/15/11  Intake/Output this shift:    Physical Exam: BP 143/68  Pulse 79  Temp(Src) 98.7 F (37.1 C) (Oral)  Resp 20  Ht 5\' 3"  (1.6 m)  Wt 98.612 kg (217 lb 6.4 oz)  BMI 38.51 kg/m2  SpO2 98% Abdomen: mild diffuse tenderness without peritonitis. Active BS Skin; rash seems to be fading and not as itchy.  Labs: CBC  Basename 04/15/11 0558 04/14/11 0540  WBC 18.1* 18.2*  HGB 9.1* 9.4*  HCT 31.1* 31.8*  PLT 234 270   BMET  Basename 04/15/11 0558 04/14/11 0540  NA 139 143  K 3.7 3.5  CL 101 105  CO2 31 29  GLUCOSE 111* 110*  BUN 51* 58*  CREATININE 1.66* 1.67*  CALCIUM 8.7 8.9   LFT  Basename 04/14/11 0540  PROT 5.6*  ALBUMIN 2.0*  AST 10  ALT 10  ALKPHOS 56  BILITOT 0.2*  BILIDIR --  IBILI --  LIPASE --   PT/INR  Basename 04/15/11 0558 04/14/11 0540  LABPROT 14.8 14.5  INR 1.14 1.11   ABG No results found for this basename: PHART:2,PCO2:2,PO2:2,HCO3:2 in the last 72 hours  Studies/Results: Dg Chest Port 1 View  04/14/2011  *RADIOLOGY REPORT*  Clinical Data: Effusions, pneumonia  PORTABLE CHEST - 1 VIEW  Comparison: April 10, 2011  Findings: The left IJ central line and nasogastric tube have been removed.  The right IJ central line tip is stable at the cavoatrial junction.  Cardiomegaly and bilateral pleural effusions,  left greater than right, appear stable. Retrocardiac infiltrate cannot be excluded.  IMPRESSION: No significant change in cardiomegaly and bilateral pleural effusions.  Question retrocardiac infiltrate.  Original Report Authenticated By: Brandon Melnick, M.D.    Assessment: Principal Problem:  *Diverticulitis, jejunal Active Problems:  DIABETES MELLITUS, TYPE II  Campath-induced atrial fibrillation  DIASTOLIC HEART FAILURE, CHRONIC  COPD  RENAL FAILURE, CHRONIC  Fever  Bradycardia  Atelectasis  Hyperkalemia     Plan: WBC unchanged and new onset diarrhea Will check C.Diff as precaution. Continue clears only for now.  LOS: 13 days    Marianna Fuss 04/15/2011

## 2011-04-15 NOTE — Progress Notes (Signed)
PARENTERAL NUTRITION CONSULT NOTE - FOLLOW UP  Pharmacy Consult for TPN Indication: prolonged ileus  Vital Signs: Temp: 98.7 F (37.1 C) (01/04 0554) BP: 143/68 mmHg (01/04 0554) Pulse Rate: 79  (01/04 0554)  Intake/Output from previous day: 01/03 0701 - 01/04 0700 In: 2607.6 [P.O.:480; I.V.:623.6; IV Piggyback:100; TPN:1404] Out: 4007 [Urine:4000; Stool:7]  Labs:  [facility] 04/15/11 0558 04/14/11 0540 04/13/11 0530  WBC 18.1* 18.2* 27.9*  HGB 9.1* 9.4* 9.2*  HCT 31.1* 31.8* 31.4*  PLT 234 270 349  APTT -- -- --  INR 1.14 1.11 1.04     Basename 04/15/11 0558 04/14/11 0540 04/13/11 0530  NA 139 143 140  K 3.7 3.5 3.2*  CL 101 105 103  CO2 31 29 29   GLUCOSE 111* 110* 182*  BUN 51* 58* 57*  CREATININE 1.66* 1.67* 1.52*  LABCREA -- -- --  CREAT24HRUR -- -- --  CALCIUM 8.7 8.9 9.2  MG -- 2.0 2.2  PHOS -- 3.2 2.6  PROT -- 5.6* --  ALBUMIN -- 2.0* --  AST -- 10 --  ALT -- 10 --  ALKPHOS -- 56 --  BILITOT -- 0.2* --  BILIDIR -- -- --  IBILI -- -- --  PREALBUMIN -- -- --  TRIG -- -- 336*  CHOLHDL -- -- --  CHOL -- -- --   Estimated Creatinine Clearance: 31.3 ml/min (by C-G formula based on Cr of 1.66).    Basename 04/15/11 0824 04/15/11 0559 04/15/11 0349  GLUCAP 115* 126* 109*   Insulin Requirements in the past 24 hours:  2 units SSI since 1800 yesterday 10 units insulin in TPN 10 units lantus CBGs are excellent  Current Nutrition:  Clinimix E5/15 at 34ml/hr + lipids at 54ml/hr MWF  Assessment: Diverticulitis with abscess and ileus. Permissive underfeeding Kcal goals per ASPEN guidelines and RD recommendations. CBGs are not excellent since pt is off steroids. Triglycerides did increase after first lipids administration but was <400 so will not withhold lipid supplementation on MWF only d/t Sport and exercise psychologist. Trigs ordered for Monday.  Potassium is improving after supplementation but remains below goal of 4.    Nutritional Goals:  1017-1187 kCal, 75-85  grams of protein per day  Plan:  1. Continue clinimix E5/15 at goal rate of 55ml/hr 2. Lipids at 65ml/hr + TE + MVI MWF d/t national shortage 3. Change CBGs to Q6H - consider DC SSI if CBGs remain excellent 4. Replete 3 runs KCl for K+ goal of >/= 4 5. F/u AM BMET, Mg, Phos and Trigs on Mon   [name], Drake Leach 04/15/2011,9:36 AM

## 2011-04-15 NOTE — Progress Notes (Signed)
I met with patient at bedside. Patient states she plans to go live with a daughter in [place] at discharge. Her son-in-law and granddaughter can provide assist when her daughter works. I have asked her to have her daughter call me to discuss rehabilitation plans. Once patient tolerance with therapy increases and GI issues resolve, we can discuss further plans for inpatient acute rehabilitation. Patient states her GOUT pain is limiting her activity. Please advise. Please call me with any questions. Pager [phone]. Patient is refusing SNF at this time.

## 2011-04-15 NOTE — Progress Notes (Signed)
Subjective: Feels better overall, breathing better, abdominal pain improving  Objective: Blood pressure 139/72, pulse 82, temperature 97 F (36.1 C), temperature source Oral, resp. rate 18, height 5\' 3"  (1.6 m), weight 98.612 kg (217 lb 6.4 oz), SpO2 95.00%. Weight change: -0.953 kg (-2 lb 1.6 oz)  Intake/Output Summary (Last 24 hours) at 04/15/11 1612 Last data filed at 04/15/11 1200  Gross per 24 hour  Intake 1559.63 ml  Output   2004 ml  Net -444.37 ml   Physical Exam: General appearance:  alert, cooperative, no distress.  Head: Normocephalic, without obvious abnormality, atraumatic   Lungs: Coarse to auscultation bilaterally, 3 L nasal cannula, respiratory effort shallow with diminished sounds in the bases, no tachypnea Heart: regular rate and rhythm, S1, S2 normal, no murmur, click, rub or gallop, IV fluids at keep open, IV heparin infusing Abdomen: distended, tympanic, soft, non tender, Bowel sounds present Extremities: extremities normal, atraumatic, trace edema Lab Results:  Basename 04/15/11 0558 04/14/11 0540 04/13/11 0530  NA 139 143 --  K 3.7 3.5 --  CL 101 105 --  CO2 31 29 --  GLUCOSE 111* 110* --  BUN 51* 58* --  CREATININE 1.66* 1.67* --  CALCIUM 8.7 8.9 --  MG -- 2.0 2.2  PHOS -- 3.2 2.6    Basename 04/15/11 0558 04/14/11 0540  WBC 18.1* 18.2*  NEUTROABS -- --  HGB 9.1* 9.4*  HCT 31.1* 31.8*  MCV 89.6 91.1  PLT 234 270  CT ABDOMEN AND PELVIS WITHOUT CONTRAST  IMPRESSION:  Decreased inflammation involving the proximal jejunal loops and  decreased small bowel distention.  There are approximately five small low density collections adjacent  to the jejunal loops in the mid abdomen. Findings are probably  related to focal fluid pockets but could represent phlegmonous  collections. Recommend follow-up to exclude early abscess  formations.  There is increased perihepatic ascites. Slightly decreased fluid  in the left paracolic gutter. Pleural effusions  may have slightly  enlarged.  Cholelithiasis with mild distention of the gallbladder.   Assessment/Plan:  1. Acute hypoxemic and hypercarbic respiratory failure/question healthcare acquired pneumonia Markedly improved. Oxygen requirement improving Secondary to pleural effusions, and possible consolidation Continue Zosyn, continue low dose lasix BID, follow 1/0s,  wean O2  2. Acute diffuse Diverticlulitis of the jejunum  There was no evidence of perforation or abscess formation on the initial CT scan of the abdomen performed 04/03/2011.  WBC somewhat better,  CT yesterday reveals what may be developing abscesses. She is on Zosyn.  Per CCS,tolerating clears When does she need a repeat CT   3. Metabolic encephalopathy RESOLVED  4. Partial small bowel obstruction Clinically improving, tolerating liquid diet  5. Atrial fibrillation with Bradycardia  The patient's bradycardia was likely due to a medication response possibly worsened due to high vagal tone in the setting of persistent abdominal pain and nausea-her heart rate has improved to 60's and 70s upon holding her rate controlling medications, continues on IV heparin, restart coumadin in next day or 2 and slowly restart Po metoprolol  6. Hypotension  RESOLVED  7. Acute renal failure/ hyperkalemia  Marked improvement in renal function/creatinine. Potassium is normalized and creatinine is stable consistent with the patient's baseline.  8. DIABETES MELLITUS, improved, continue lantus, SSI  9. Chronic hypoxemic respiratory failure secondary to COPD  on home O2 continuously at 2 L per minute-diminished inspiratory effort related to abdominal pain is currently felt to be contributing to persistent hypoxia and increased oxygen needs which are stable  at this point.     LOS: 13 days   [name] [phone] 04/15/2011, 4:12 PM

## 2011-04-16 DIAGNOSIS — A0472 Enterocolitis due to Clostridium difficile, not specified as recurrent: Secondary | ICD-10-CM | POA: Diagnosis not present

## 2011-04-16 LAB — GLUCOSE, CAPILLARY: Glucose-Capillary: 338 mg/dL — ABNORMAL HIGH (ref 70–99)

## 2011-04-16 LAB — BASIC METABOLIC PANEL
CO2: 31 mEq/L (ref 19–32)
Calcium: 8.7 mg/dL (ref 8.4–10.5)
Chloride: 99 mEq/L (ref 96–112)
Glucose, Bld: 241 mg/dL — ABNORMAL HIGH (ref 70–99)
Sodium: 139 mEq/L (ref 135–145)

## 2011-04-16 LAB — MAGNESIUM: Magnesium: 2 mg/dL (ref 1.5–2.5)

## 2011-04-16 LAB — CBC
HCT: 28.8 % — ABNORMAL LOW (ref 36.0–46.0)
Hemoglobin: 8.5 g/dL — ABNORMAL LOW (ref 12.0–15.0)
MCHC: 29.5 g/dL — ABNORMAL LOW (ref 30.0–36.0)
RBC: 3.21 MIL/uL — ABNORMAL LOW (ref 3.87–5.11)

## 2011-04-16 LAB — PROTIME-INR
INR: 1.18 (ref 0.00–1.49)
Prothrombin Time: 15.2 seconds (ref 11.6–15.2)

## 2011-04-16 LAB — PHOSPHORUS: Phosphorus: 4.8 mg/dL — ABNORMAL HIGH (ref 2.3–4.6)

## 2011-04-16 LAB — HEPARIN LEVEL (UNFRACTIONATED): Heparin Unfractionated: 0.37 IU/mL (ref 0.30–0.70)

## 2011-04-16 MED ORDER — INSULIN REGULAR HUMAN 100 UNIT/ML IJ SOLN
INTRAVENOUS | Status: AC
  Administered 2011-04-16: 18:00:00 via INTRAVENOUS
  Filled 2011-04-16: qty 2000

## 2011-04-16 MED ORDER — METRONIDAZOLE 500 MG PO TABS
500.0000 mg | ORAL_TABLET | Freq: Three times a day (TID) | ORAL | Status: DC
  Administered 2011-04-16 – 2011-04-19 (×10): 500 mg via ORAL
  Filled 2011-04-16 (×12): qty 1

## 2011-04-16 MED ORDER — INSULIN GLARGINE 100 UNIT/ML ~~LOC~~ SOLN
15.0000 [IU] | Freq: Every day | SUBCUTANEOUS | Status: DC
  Administered 2011-04-16 – 2011-04-17 (×2): 15 [IU] via SUBCUTANEOUS

## 2011-04-16 MED ORDER — FUROSEMIDE 20 MG PO TABS
20.0000 mg | ORAL_TABLET | Freq: Every day | ORAL | Status: DC
  Administered 2011-04-16 – 2011-04-19 (×4): 20 mg via ORAL
  Filled 2011-04-16 (×5): qty 1

## 2011-04-16 NOTE — Progress Notes (Signed)
ANTICOAGULATION CONSULT NOTE - Follow Up Consult  Pharmacy Consult for Heparin Indication: afib  Allergies  Allergen Reactions  . Sulfonamide Derivatives     Childhood reaction    Patient Measurements: Height: 5\' 3"  (160 cm) Weight: 217 lb 6.4 oz (98.612 kg) IBW/kg (Calculated) : 52.4    Vital Signs: Temp: 97.7 F (36.5 C) (01/05 0609) BP: 149/69 mmHg (01/05 0609) Pulse Rate: 68  (01/05 0609)  Labs:  Basename 04/16/11 0600 04/15/11 0558 04/14/11 1820 04/14/11 0540  HGB 8.5* 9.1* -- --  HCT 28.8* 31.1* -- 31.8*  PLT 202 234 -- 270  APTT -- -- -- --  LABPROT 15.2 14.8 -- 14.5  INR 1.18 1.14 -- 1.11  HEPARINUNFRC 0.37 0.32 0.31 --  CREATININE 1.67* 1.66* -- 1.67*  CKTOTAL -- -- -- --  CKMB -- -- -- --  TROPONINI -- -- -- --   Estimated Creatinine Clearance: 31.1 ml/min (by C-G formula based on Cr of 1.67).   Medications:  Scheduled:    . albuterol  2 puff Inhalation BID  . antiseptic oral rinse  15 mL Mouth Rinse QID  . Chlorhexidine Gluconate Cloth  6 each Topical Q0600  . colchicine  0.6 mg Oral Daily  . feeding supplement  240 mL Oral TID WC  . fluticasone  2 puff Inhalation BID  . furosemide  20 mg Oral Daily  . insulin aspart  0-15 Units Subcutaneous Q6H  . insulin glargine  10 Units Subcutaneous QHS  . ipratropium  2 puff Inhalation BID  . LORazepam  0.5 mg Sublingual QHS  . metroNIDAZOLE  500 mg Oral Q8H  . mupirocin ointment  1 application Nasal BID  . pantoprazole (PROTONIX) IV  40 mg Intravenous Q24H  . piperacillin-tazobactam (ZOSYN)  IV  3.375 g Intravenous Q8H  . potassium chloride  10 mEq Intravenous Q1 Hr x 3  . sodium chloride  10 mL Intracatheter Q12H  . DISCONTD: furosemide  20 mg Intravenous BID  . DISCONTD: metroNIDAZOLE  500 mg Oral TID  . DISCONTD: metroNIDAZOLE  500 mg Oral Q8H    Assessment: 78 y/o female patient receiving heparin gtt for afib. Coumadin on hold d/t npo status. H/h stable, no bleeding reported.  Goal of Therapy:   Heparin level 0.3-0.7 units/ml   Plan:  Continue gtt at 1650 units/hr and f/u in am.  Verlene Mayer, PharmD, BCPS Pager [phone]   Leodis Binet, Ephriam Knuckles Judie Petit 04/16/2011,2:04 PM

## 2011-04-16 NOTE — Progress Notes (Signed)
PARENTERAL NUTRITION CONSULT NOTE - FOLLOW UP  Pharmacy Consult for TPN Indication: prolonged ileus  Vital Signs: Temp: 97.7 F (36.5 C) (01/05 0609) BP: 149/69 mmHg (01/05 0609) Pulse Rate: 68  (01/05 0609)  Intake/Output from previous day: 01/04 0701 - 01/05 0700 In: 2358.9 [P.O.:220; I.V.:408.4; IV Piggyback:110; TPN:1620.5] Out: 3952 [Urine:3950; Stool:2]  Labs:  [facility] 04/16/11 0600 04/15/11 0558 04/14/11 0540  WBC 18.4* 18.1* 18.2*  HGB 8.5* 9.1* 9.4*  HCT 28.8* 31.1* 31.8*  PLT 202 234 270  APTT -- -- --  INR 1.18 1.14 1.11     Basename 04/16/11 0600 04/15/11 0558 04/14/11 0540  NA 139 139 143  K 4.1 3.7 3.5  CL 99 101 105  CO2 31 31 29   GLUCOSE 241* 111* 110*  BUN 50* 51* 58*  CREATININE 1.67* 1.66* 1.67*  LABCREA -- -- --  CREAT24HRUR -- -- --  CALCIUM 8.7 8.7 8.9  MG 2.0 -- 2.0  PHOS 4.8* -- 3.2  PROT -- -- 5.6*  ALBUMIN -- -- 2.0*  AST -- -- 10  ALT -- -- 10  ALKPHOS -- -- 56  BILITOT -- -- 0.2*  BILIDIR -- -- --  IBILI -- -- --  PREALBUMIN -- -- --  TRIG -- -- --  CHOLHDL -- -- --  CHOL -- -- --   Estimated Creatinine Clearance: 31.1 ml/min (by C-G formula based on Cr of 1.67).    Basename 04/16/11 0615 04/15/11 2358 04/15/11 2007  GLUCAP 222* 338* 291*   Insulin Requirements in the past 24 hours:  16 units SSI since 1800 yesterday 10 units insulin in TPN 10 units lantus CBGs elevated, goal <150  Current Nutrition:  Clinimix E5/15 at 54ml/hr + lipids at 74ml/hr MWF  Assessment: Diverticulitis with abscess and ileus. Permissive underfeeding Kcal goals per ASPEN guidelines and RD recommendations. CBGs increased since yesterday - received one dose of prednisone 40 mg po, no further steroids ordered. Reluctant to increase insulin in tna or long-acting insulin at this time.  Suspect that jump due to one-time dose of steroids.  Triglycerides did increase after first lipids administration but was <400 so will not withhold lipid  supplementation on MWF only d/t Sport and exercise psychologist. Trigs ordered for Monday.  Potassium improved.    Nutritional Goals:  New goal: 1700-1900 kCal, 75-85 grams of protein per day  Plan:  1. Remove lytes: change to clinimix 5/15 at rate of 55 ml/hr.  New goal rate is 70 ml/hr but unable to advance until endo is well controlled. 2. Lipids at new goal of 10 ml/hr + TE + MVI MWF d/t national shortage 3.  F/u am BMET, Mg, Phos. 4.  Monitor cbg closely.     Leomia Blake L. Illene Bolus, PharmD, BCPS Clinical Pharmacist Pager: [phone] 04/16/2011 10:30 AM

## 2011-04-16 NOTE — Progress Notes (Signed)
The patient was seen, examined and PA note reviewed and data reviewed.  I agree with the plan of action. 

## 2011-04-16 NOTE — Progress Notes (Signed)
Subjective: Feels better overall, breathing better, abdominal pain improving, ankle joints feel better  Objective: Blood pressure 149/69, pulse 68, temperature 97.7 F (36.5 C), temperature source Oral, resp. rate 18, height 5\' 3"  (1.6 m), weight 98.612 kg (217 lb 6.4 oz), SpO2 95.00%. Weight change:   Intake/Output Summary (Last 24 hours) at 04/16/11 1345 Last data filed at 04/16/11 0700  Gross per 24 hour  Intake 2138.86 ml  Output   3951 ml  Net -1812.14 ml   Physical Exam: General appearance:  alert, cooperative, no distress.  Head: Normocephalic, without obvious abnormality, atraumatic   Lungs: Coarse to auscultation bilaterally, 3 L nasal cannula, respiratory effort shallow with diminished sounds in the bases, no tachypnea Heart: regular rate and rhythm, S1, S2 normal, no murmur, click, rub or gallop, IV fluids at keep open, IV heparin infusing Abdomen: distended, tympanic, soft, non tender, Bowel sounds present Extremities: extremities normal, atraumatic, trace edema Lab Results:  Basename 04/16/11 0600 04/15/11 0558 04/14/11 0540  NA 139 139 --  K 4.1 3.7 --  CL 99 101 --  CO2 31 31 --  GLUCOSE 241* 111* --  BUN 50* 51* --  CREATININE 1.67* 1.66* --  CALCIUM 8.7 8.7 --  MG 2.0 -- 2.0  PHOS 4.8* -- 3.2    Basename 04/16/11 0600 04/15/11 0558  WBC 18.4* 18.1*  NEUTROABS -- --  HGB 8.5* 9.1*  HCT 28.8* 31.1*  MCV 89.7 89.6  PLT 202 234  CT ABDOMEN AND PELVIS WITHOUT CONTRAST  IMPRESSION:  Decreased inflammation involving the proximal jejunal loops and  decreased small bowel distention.  There are approximately five small low density collections adjacent  to the jejunal loops in the mid abdomen. Findings are probably  related to focal fluid pockets but could represent phlegmonous  collections. Recommend follow-up to exclude early abscess  formations.  There is increased perihepatic ascites. Slightly decreased fluid  in the left paracolic gutter. Pleural  effusions may have slightly  enlarged.  Cholelithiasis with mild distention of the gallbladder.   Assessment/Plan:  1. Acute hypoxemic and hypercarbic respiratory failure/question healthcare acquired pneumonia Markedly improved. Oxygen requirement improving Secondary to pleural effusions, and possible consolidation Continue Zosyn, continue low dose lasix BID, follow 1/0s,  wean O2  2. Acute diffuse Diverticlulitis of the jejunum  There was no evidence of perforation or abscess formation on the initial CT scan of the abdomen performed 04/03/2011.  WBC improving,  Repeat CT concerning for what may be developing abscesses. She is on Zosyn.  Per CCS,tolerating clears  repeat CT per CCS  3. Cdiff colitis: start metronidazole  4. Partial small bowel obstruction Clinically improving, tolerating liquid diet  5. Atrial fibrillation with Bradycardia  Rate controlled, continues on IV heparin, restart coumadin in next day or 2 and slowly restart Po metoprolol if heart rate trends up gain  7. Acute renal failure/ hyperkalemia  improved  8. DIABETES MELLITUS, improved, increase lantus, SSI  9. Chronic hypoxemic respiratory failure secondary to COPD  Back on home O2  10. Gouty flare: responded to prednisone dose yesterday, and colchicine for 2days   LOS: 14 days   [name] [phone] 04/16/2011, 1:45 PM

## 2011-04-16 NOTE — Progress Notes (Signed)
Patient ID: [name], female   DOB: July 15, 1930, 78 y.o.   MRN: [id number]    Subjective: Pt feels a little better today.  Less diarrhea.  No BM since yesterday.  Not hungry.  Not eating much of her clears, but what she does eat, she tolerates.  C. DIFF IS POSITIVE  Objective: Vital signs in last 24 hours: Temp:  [97 F (36.1 C)-97.9 F (36.6 C)] 97.7 F (36.5 C) (01/05 0609) Pulse Rate:  [68-86] 68  (01/05 0609) Resp:  [18-20] 18  (01/05 0609) BP: (139-151)/(69-72) 149/69 mmHg (01/05 0609) SpO2:  [93 %-95 %] 95 % (01/05 0726) FiO2 (%):  [96 %] 96 % (01/04 1955) Last BM Date: 04/15/11  Intake/Output from previous day: 01/04 0701 - 01/05 0700 In: 2358.9 [P.O.:220; I.V.:408.4; IV Piggyback:110; TPN:1620.5] Out: 3952 [Urine:3950; Stool:2] Intake/Output this shift:    PE: Abd: soft, mild diffuse tenderness, +BS, obese Ht: regular Lungs: CTAB  Lab Results:   Basename 04/16/11 0600 04/15/11 0558  WBC 18.4* 18.1*  HGB 8.5* 9.1*  HCT 28.8* 31.1*  PLT 202 234   BMET  Basename 04/16/11 0600 04/15/11 0558  NA 139 139  K 4.1 3.7  CL 99 101  CO2 31 31  GLUCOSE 241* 111*  BUN 50* 51*  CREATININE 1.67* 1.66*  CALCIUM 8.7 8.7   PT/INR  Basename 04/16/11 0600 04/15/11 0558  LABPROT 15.2 14.8  INR 1.18 1.14     Studies/Results: No results found.  Anti-infectives: Anti-infectives     Start     Dose/Rate Route Frequency Ordered Stop   04/15/11 2300   metroNIDAZOLE (FLAGYL) tablet 500 mg  Status:  Discontinued        500 mg Oral 3 times per day 04/15/11 2253 04/15/11 2254   04/15/11 2300   metroNIDAZOLE (FLAGYL) 50 mg/ml oral suspension 500 mg        500 mg Oral 3 times daily 04/15/11 2254     04/10/11 1400  piperacillin-tazobactam (ZOSYN) IVPB 3.375 g       3.375 g 12.5 mL/hr over 240 Minutes Intravenous 3 times per day 04/10/11 1135     04/07/11 1200   vancomycin (VANCOCIN) IVPB 1000 mg/200 mL premix  Status:  Discontinued        1,000 mg 200 mL/hr over  60 Minutes Intravenous Every 24 hours 04/06/11 0941 04/10/11 1152   04/06/11 1200   vancomycin (VANCOCIN) 1,500 mg in sodium chloride 0.9 % 500 mL IVPB        1,500 mg 250 mL/hr over 120 Minutes Intravenous  Once 04/06/11 0941 04/06/11 1343   04/06/11 1200   piperacillin-tazobactam (ZOSYN) IVPB 2.25 g  Status:  Discontinued        2.25 g 100 mL/hr over 30 Minutes Intravenous 4 times per day 04/06/11 0941 04/10/11 1135   04/04/11 0000   ciprofloxacin (CIPRO) IVPB 400 mg  Status:  Discontinued        400 mg 200 mL/hr over 60 Minutes Intravenous Every 24 hours 04/03/11 0036 04/06/11 0847   04/03/11 0045   ciprofloxacin (CIPRO) IVPB 400 mg        400 mg 200 mL/hr over 60 Minutes Intravenous NOW 04/03/11 0036 04/03/11 0145   04/02/11 2359   metroNIDAZOLE (FLAGYL) IVPB 500 mg  Status:  Discontinued        500 mg 100 mL/hr over 60 Minutes Intravenous Every 8 hours 04/02/11 2328 04/06/11 0847   04/02/11 2115   cefTRIAXone (ROCEPHIN) 1 g in dextrose  5 % 50 mL IVPB        1 g 100 mL/hr over 30 Minutes Intravenous  Once 04/02/11 2110 04/02/11 2314           Assessment/Plan  1. Jejunal diverticulitis 2. C. Diff colitis  Plan: Will cont clear liquids for now. Treat c. Diff per primary team Patient will need repeat CT scan soon.   LOS: 14 days    Dela Sweeny E 04/16/2011

## 2011-04-17 LAB — HEPARIN LEVEL (UNFRACTIONATED)
Heparin Unfractionated: 0.13 IU/mL — ABNORMAL LOW (ref 0.30–0.70)
Heparin Unfractionated: 0.19 IU/mL — ABNORMAL LOW (ref 0.30–0.70)

## 2011-04-17 LAB — GLUCOSE, CAPILLARY
Glucose-Capillary: 133 mg/dL — ABNORMAL HIGH (ref 70–99)
Glucose-Capillary: 189 mg/dL — ABNORMAL HIGH (ref 70–99)
Glucose-Capillary: 216 mg/dL — ABNORMAL HIGH (ref 70–99)
Glucose-Capillary: 334 mg/dL — ABNORMAL HIGH (ref 70–99)

## 2011-04-17 LAB — PHOSPHORUS: Phosphorus: 3.2 mg/dL (ref 2.3–4.6)

## 2011-04-17 LAB — BASIC METABOLIC PANEL
CO2: 33 mEq/L — ABNORMAL HIGH (ref 19–32)
Calcium: 8.7 mg/dL (ref 8.4–10.5)
Chloride: 99 mEq/L (ref 96–112)
Glucose, Bld: 125 mg/dL — ABNORMAL HIGH (ref 70–99)
Potassium: 3.5 mEq/L (ref 3.5–5.1)
Sodium: 141 mEq/L (ref 135–145)

## 2011-04-17 LAB — CBC
HCT: 29.4 % — ABNORMAL LOW (ref 36.0–46.0)
Hemoglobin: 8.7 g/dL — ABNORMAL LOW (ref 12.0–15.0)
MCHC: 29.6 g/dL — ABNORMAL LOW (ref 30.0–36.0)
RBC: 3.29 MIL/uL — ABNORMAL LOW (ref 3.87–5.11)
WBC: 14.6 10*3/uL — ABNORMAL HIGH (ref 4.0–10.5)

## 2011-04-17 LAB — PROTIME-INR
INR: 1.12 (ref 0.00–1.49)
Prothrombin Time: 14.6 seconds (ref 11.6–15.2)

## 2011-04-17 LAB — MAGNESIUM: Magnesium: 1.6 mg/dL (ref 1.5–2.5)

## 2011-04-17 MED ORDER — INSULIN REGULAR HUMAN 100 UNIT/ML IJ SOLN
INTRAVENOUS | Status: AC
  Administered 2011-04-17: 18:00:00 via INTRAVENOUS
  Filled 2011-04-17: qty 2000

## 2011-04-17 MED ORDER — ONDANSETRON HCL 4 MG/2ML IJ SOLN: 4.0000 mg | Freq: Four times a day (QID) | INTRAMUSCULAR | Status: DC | PRN

## 2011-04-17 MED ORDER — HEPARIN SOD (PORCINE) IN D5W 100 UNIT/ML IV SOLN
1850.0000 [IU]/h | INTRAVENOUS | Status: DC
  Administered 2011-04-17: 1850 [IU]/h via INTRAVENOUS
  Filled 2011-04-17 (×2): qty 250

## 2011-04-17 MED ORDER — MAGNESIUM SULFATE 50 % IJ SOLN
3.0000 g | Freq: Once | INTRAVENOUS | Status: AC
  Administered 2011-04-17: 3 g via INTRAVENOUS
  Filled 2011-04-17: qty 6

## 2011-04-17 MED ORDER — COLCHICINE 0.6 MG PO TABS
0.6000 mg | ORAL_TABLET | Freq: Every day | ORAL | Status: DC
  Administered 2011-04-17 – 2011-04-19 (×3): 0.6 mg via ORAL
  Filled 2011-04-17 (×3): qty 1

## 2011-04-17 MED ORDER — PREDNISONE 20 MG PO TABS
30.0000 mg | ORAL_TABLET | Freq: Once | ORAL | Status: AC
  Administered 2011-04-17: 30 mg via ORAL
  Filled 2011-04-17: qty 1

## 2011-04-17 MED ORDER — POTASSIUM CHLORIDE 10 MEQ/100ML IV SOLN
10.0000 meq | INTRAVENOUS | Status: AC
  Administered 2011-04-17 (×3): 10 meq via INTRAVENOUS
  Filled 2011-04-17 (×5): qty 100

## 2011-04-17 NOTE — Progress Notes (Signed)
PARENTERAL NUTRITION CONSULT NOTE - FOLLOW UP  Pharmacy Consult for TPN Indication: prolonged ileus  Vital Signs: Temp: 98.6 F (37 C) (01/06 0517) Temp src: Oral (01/06 0517) BP: 137/52 mmHg (01/06 0517) Pulse Rate: 76  (01/06 0517)  Intake/Output from previous day: 01/05 0701 - 01/06 0700 In: 2761 [P.O.:540; I.V.:704; IV Piggyback:150; TPN:1367] Out: 3300 [Urine:3300]  Labs:  [facility] 04/17/11 0630 04/16/11 0600 04/15/11 0558  WBC 14.6* 18.4* 18.1*  HGB 8.7* 8.5* 9.1*  HCT 29.4* 28.8* 31.1*  PLT 211 202 234  APTT -- -- --  INR 1.12 1.18 1.14     Basename 04/17/11 0630 04/16/11 0600 04/15/11 0558  NA 141 139 139  K 3.5 4.1 3.7  CL 99 99 101  CO2 33* 31 31  GLUCOSE 125* 241* 111*  BUN 49* 50* 51*  CREATININE 1.54* 1.67* 1.66*  LABCREA -- -- --  CREAT24HRUR -- -- --  CALCIUM 8.7 8.7 8.7  MG 1.6 2.0 --  PHOS 3.2 4.8* --  PROT -- -- --  ALBUMIN -- -- --  AST -- -- --  ALT -- -- --  ALKPHOS -- -- --  BILITOT -- -- --  BILIDIR -- -- --  IBILI -- -- --  PREALBUMIN -- -- --  TRIG -- -- --  CHOLHDL -- -- --  CHOL -- -- --   Estimated Creatinine Clearance: 33.7 ml/min (by C-G formula based on Cr of 1.54).    Basename 04/17/11 0621 04/16/11 2357 04/16/11 1646  GLUCAP 133* 216* 175*   Insulin Requirements in the past 24 hours:  10 units SSI since 1800 yesterday 10 units insulin in TPN 15 units lantus CBGs slightly elevated, goal <150  Current Nutrition:  Clinimix 5/15 at 23ml/hr + lipids at 49ml/hr MWF  Assessment: Diverticulitis with abscess and ileus.  CBGs with improved control s/p jump 04/16/11, likely 2/2 one dose of prednisone 40 mg po, no further steroids ordered.   Triglycerides did increase after first lipids administration but was <400 so will not withhold lipid supplementation on MWF only d/t Sport and exercise psychologist. Trigs ordered for Monday.    Lytes: K / Mag low, as expected with no lytes in TNA 2/2 phos elevation.  Phos slowly trending  down.  Nutritional Goals:  New goal: 1700-1900 kCal, 75-85 grams of protein per day  Plan:  1. Add back lytes: change to clinimix E5/15 and advance to new goal of 70 ml/hr. 2. Lipids at new goal of 10 ml/hr + TE + MVI MWF d/t national shortage 3.  Replete K / Mag 4.  F/u am TNA labs 5.  Monitor cbg closely     Jill Side L. Illene Bolus, PharmD, BCPS Clinical Pharmacist Pager: [phone] 04/17/2011 8:46 AM

## 2011-04-17 NOTE — Progress Notes (Signed)
Subjective: Diarrhea and pain much better.  Objective: Vital signs in last 24 hours: Temp:  [97.6 F (36.4 C)-98.6 F (37 C)] 98.6 F (37 C) (01/06 0517) Pulse Rate:  [76-85] 76  (01/06 0517) Resp:  [20] 20  (01/06 0517) BP: (134-148)/(50-70) 137/52 mmHg (01/06 0517) SpO2:  [94 %-97 %] 97 % (01/06 0517) Last BM Date: 04/16/11  Intake/Output from previous day: 01/05 0701 - 01/06 0700 In: 2761 [P.O.:540; I.V.:704; IV Piggyback:150; TPN:1367] Out: 3300 [Urine:3300] Intake/Output this shift:    GI: soft, non-tender; bowel sounds normal; no masses,  no organomegaly  Lab Results:   Basename 04/17/11 0630 04/16/11 0600  WBC 14.6* 18.4*  HGB 8.7* 8.5*  HCT 29.4* 28.8*  PLT 211 202   BMET  Basename 04/17/11 0630 04/16/11 0600  NA 141 139  K 3.5 4.1  CL 99 99  CO2 33* 31  GLUCOSE 125* 241*  BUN 49* 50*  CREATININE 1.54* 1.67*  CALCIUM 8.7 8.7   PT/INR  Basename 04/17/11 0630 04/16/11 0600  LABPROT 14.6 15.2  INR 1.12 1.18   ABG No results found for this basename: PHART:2,PCO2:2,PO2:2,HCO3:2 in the last 72 hours  Studies/Results: No results found.  Anti-infectives: Anti-infectives     Start     Dose/Rate Route Frequency Ordered Stop   04/16/11 1400   metroNIDAZOLE (FLAGYL) tablet 500 mg        500 mg Oral 3 times per day 04/16/11 1027     04/15/11 2300   metroNIDAZOLE (FLAGYL) tablet 500 mg  Status:  Discontinued        500 mg Oral 3 times per day 04/15/11 2253 04/15/11 2254   04/15/11 2300   metroNIDAZOLE (FLAGYL) 50 mg/ml oral suspension 500 mg  Status:  Discontinued        500 mg Oral 3 times daily 04/15/11 2254 04/16/11 1130   04/10/11 1400   piperacillin-tazobactam (ZOSYN) IVPB 3.375 g        3.375 g 12.5 mL/hr over 240 Minutes Intravenous 3 times per day 04/10/11 1135     04/07/11 1200   vancomycin (VANCOCIN) IVPB 1000 mg/200 mL premix  Status:  Discontinued        1,000 mg 200 mL/hr over 60 Minutes Intravenous Every 24 hours 04/06/11 0941  04/10/11 1152   04/06/11 1200   vancomycin (VANCOCIN) 1,500 mg in sodium chloride 0.9 % 500 mL IVPB        1,500 mg 250 mL/hr over 120 Minutes Intravenous  Once 04/06/11 0941 04/06/11 1343   04/06/11 1200   piperacillin-tazobactam (ZOSYN) IVPB 2.25 g  Status:  Discontinued        2.25 g 100 mL/hr over 30 Minutes Intravenous 4 times per day 04/06/11 0941 04/10/11 1135   04/04/11 0000   ciprofloxacin (CIPRO) IVPB 400 mg  Status:  Discontinued        400 mg 200 mL/hr over 60 Minutes Intravenous Every 24 hours 04/03/11 0036 04/06/11 0847   04/03/11 0045   ciprofloxacin (CIPRO) IVPB 400 mg        400 mg 200 mL/hr over 60 Minutes Intravenous NOW 04/03/11 0036 04/03/11 0145   04/02/11 2359   metroNIDAZOLE (FLAGYL) IVPB 500 mg  Status:  Discontinued        500 mg 100 mL/hr over 60 Minutes Intravenous Every 8 hours 04/02/11 2328 04/06/11 0847   04/02/11 2115   cefTRIAXone (ROCEPHIN) 1 g in dextrose 5 % 50 mL IVPB        1 g  100 mL/hr over 30 Minutes Intravenous  Once 04/02/11 2110 04/02/11 2314          Assessment/Plan: s/p  Patient Active Problem List  Diagnoses  . HYPOTHYROIDISM  . DIABETES MELLITUS, TYPE II  . OVERWEIGHT/OBESITY  . OBSTRUCTIVE SLEEP APNEA  . HYPERTENSION, UNSPECIFIED  . Campath-induced atrial fibrillation  . DIASTOLIC HEART FAILURE, CHRONIC  . COPD  . RENAL FAILURE, CHRONIC  . SLEEP APNEA  . HYPERLIPIDEMIA-MIXED  . Fatigue  . Osteoarthritis  . Venous reflux  . Fever  . Bradycardia  . Diverticulitis of jejunum  . Atelectasis  . Hyperkalemia  . C. difficile colitis   Improving.  And advance diet as tolerated.   Advance diet Cont antibiotics for C diff.  Can stop all other antibiotics on Monday.  Will follow with you.  CT next week.  LOS: 15 days    Jaran Sainz A. 04/17/2011

## 2011-04-17 NOTE — Progress Notes (Signed)
ANTICOAGULATION CONSULT NOTE - Follow Up Consult  Pharmacy Consult for Heparin Indication: atrial fibrillation  Allergies  Allergen Reactions  . Sulfonamide Derivatives     Childhood reaction    Patient Measurements: Height: 5\' 3"  (160 cm) Weight: 217 lb 6.4 oz (98.612 kg) IBW/kg (Calculated) : 52.4  Adjusted Body Weight:  Vital Signs: Temp: 97.9 F (36.6 C) (01/06 1300) Temp src: Oral (01/06 1300) BP: 135/55 mmHg (01/06 1300) Pulse Rate: 77  (01/06 1300)  Labs:  Basename 04/17/11 1750 04/17/11 0630 04/16/11 0600 04/15/11 0558  HGB -- 8.7* 8.5* --  HCT -- 29.4* 28.8* 31.1*  PLT -- 211 202 234  APTT -- -- -- --  LABPROT -- 14.6 15.2 14.8  INR -- 1.12 1.18 1.14  HEPARINUNFRC 0.19* 0.13* 0.37 --  CREATININE -- 1.54* 1.67* 1.66*  CKTOTAL -- -- -- --  CKMB -- -- -- --  TROPONINI -- -- -- --   Estimated Creatinine Clearance: 33.7 ml/min (by C-G formula based on Cr of 1.54).   Medications:  Infusions:     . sodium chloride 20 mL/hr at 04/17/11 0958  . heparin 16.5 mL/hr (04/16/11 0700)  . TPN (CLINIMIX) +/- additives 55 mL/hr at 04/17/11 0700  . TPN (CLINIMIX) +/- additives 70 mL/hr at 04/17/11 1733    Assessment: 78 y/o female patient receiving heparin gtt for h/o afib, coumadin remains on hold, INR subtherapeutic. Subtherapeutic heparin level, recheck confirms low rate. Heparin wt=75.4kg  Goal of Therapy:  Heparin level 0.3-0.7 units/ml   Plan:  1. Increase heparin to 1850units/hr 2. Check am heparin level  Juliette Alcide, PharmD, BCPS.  Pager: [phone] 04/17/2011,7:36 PM

## 2011-04-17 NOTE — Progress Notes (Signed)
ANTICOAGULATION CONSULT NOTE - Follow Up Consult  Pharmacy Consult for Heparin Indication: atrial fibrillation  Allergies  Allergen Reactions  . Sulfonamide Derivatives     Childhood reaction    Patient Measurements: Height: 5\' 3"  (160 cm) Weight: 217 lb 6.4 oz (98.612 kg) IBW/kg (Calculated) : 52.4  Adjusted Body Weight:  Vital Signs: Temp: 98.6 F (37 C) (01/06 0517) Temp src: Oral (01/06 0517) BP: 137/52 mmHg (01/06 0517) Pulse Rate: 76  (01/06 0517)  Labs:  Basename 04/17/11 0630 04/16/11 0600 04/15/11 0558  HGB 8.7* 8.5* --  HCT 29.4* 28.8* 31.1*  PLT 211 202 234  APTT -- -- --  LABPROT 14.6 15.2 14.8  INR 1.12 1.18 1.14  HEPARINUNFRC 0.13* 0.37 0.32  CREATININE 1.54* 1.67* 1.66*  CKTOTAL -- -- --  CKMB -- -- --  TROPONINI -- -- --   Estimated Creatinine Clearance: 33.7 ml/min (by C-G formula based on Cr of 1.54).   Medications:  Infusions:    . sodium chloride 20 mL/hr (04/16/11 2233)  . fat emulsion 168 mL (04/16/11 0700)  . heparin 16.5 mL/hr (04/16/11 0700)  . TPN (CLINIMIX) +/- additives 55 mL/hr at 04/17/11 0700  . TPN (CLINIMIX) +/- additives    . TPN (CLINIMIX) +/- additives 55 mL/hr at 04/16/11 0700    Assessment: 78 y/o female patient receiving heparin gtt for h/o afib, coumadin remains on hold, INR subtherapeutic. Subtherapeutic heparin level, has been therapeutic at this heparin rate for many days. Will recheck level later today.  Goal of Therapy:  Heparin level 0.3-0.7 units/ml   Plan:  Continue heparin gtt at 1650 units/hr and check heparin level at 1800.  Verlene Mayer, PharmD, BCPS Pager [phone] 04/17/2011,1:29 PM

## 2011-04-17 NOTE — Progress Notes (Signed)
Subjective: Having nausea today, one episode of diarrhea, breathing is okay, left ankle and right foot first toe (MTP joint) improved still painful  Objective: Blood pressure 137/52, pulse 76, temperature 98.6 F (37 C), temperature source Oral, resp. rate 20, height 5\' 3"  (1.6 m), weight 98.612 kg (217 lb 6.4 oz), SpO2 97.00%. Weight change:   Intake/Output Summary (Last 24 hours) at 04/17/11 1440 Last data filed at 04/17/11 0700  Gross per 24 hour  Intake   2221 ml  Output   3300 ml  Net  -1079 ml   Physical Exam: General appearance:  alert, cooperative, no distress.  Head: Normocephalic, without obvious abnormality, atraumatic   Lungs: Coarse to auscultation bilaterally, 3 L nasal cannula, respiratory effort shallow with diminished sounds in the bases, no tachypnea Heart: regular rate and rhythm, S1, S2 normal, no murmur, click, rub or gallop, IV fluids at keep open, IV heparin infusing Abdomen: distended, tympanic, soft, non tender, Bowel sounds present Extremities: Trace edema, slight swelling and tender with movement of left ankle joint, swelling and tenderness of right foot first MTP joint Lab Results:  Basename 04/17/11 0630 04/16/11 0600  NA 141 139  K 3.5 4.1  CL 99 99  CO2 33* 31  GLUCOSE 125* 241*  BUN 49* 50*  CREATININE 1.54* 1.67*  CALCIUM 8.7 8.7  MG 1.6 2.0  PHOS 3.2 4.8*    Basename 04/17/11 0630 04/16/11 0600  WBC 14.6* 18.4*  NEUTROABS -- --  HGB 8.7* 8.5*  HCT 29.4* 28.8*  MCV 89.4 89.7  PLT 211 202    Assessment/Plan:  1. Acute hypoxemic and hypercarbic respiratory failure/question healthcare acquired pneumonia Markedly improved. Oxygen requirement improving Secondary to pleural effusions, and possible consolidation Continue Zosyn, continue low dose lasix BID, follow 1/0s,  back on home oxygen  2. Acute diffuse Diverticlulitis of the jejunum  There was no evidence of perforation or abscess formation on the initial CT scan of the abdomen  performed 04/03/2011.  WBC improving,  Repeat CT concerning for what may be developing abscesses. She is on Zosyn.  Per CCS,tolerating clears CCS last repeat her CT early next week Suspect her nausea secondary to Flagyl, will add Zofran  3. Cdiff colitis: Continue metronidazole  4. Partial small bowel obstruction Clinically improving, tolerating liquid diet  5. Atrial fibrillation with Bradycardia  Rate controlled, continues on IV heparin, restart Coumadin pending repeat CT  7. Acute renal failure/ hyperkalemia  Resolved  8. DIABETES MELLITUS, improved, increase lantus, SSI  9. Chronic hypoxemic respiratory failure secondary to COPD  Back on home O2  10. Gouty flare: Repeat: Prednisone 40 mg today and colchicine    LOS: 15 days   [name] [phone] 04/17/2011, 2:40 PM

## 2011-04-18 ENCOUNTER — Inpatient Hospital Stay (HOSPITAL_COMMUNITY): Payer: Medicare Other

## 2011-04-18 ENCOUNTER — Other Ambulatory Visit (HOSPITAL_COMMUNITY): Payer: Medicare Other

## 2011-04-18 LAB — CBC
HCT: 29.4 % — ABNORMAL LOW (ref 36.0–46.0)
HCT: 29.3 % — ABNORMAL LOW (ref 36.0–46.0)
Hemoglobin: 8.8 g/dL — ABNORMAL LOW (ref 12.0–15.0)
Hemoglobin: 8.9 g/dL — ABNORMAL LOW (ref 12.0–15.0)
MCH: 26.6 pg (ref 26.0–34.0)
MCH: 27 pg (ref 26.0–34.0)
MCHC: 30.4 g/dL (ref 30.0–36.0)
MCV: 88.8 fL (ref 78.0–100.0)
RBC: 3.31 MIL/uL — ABNORMAL LOW (ref 3.87–5.11)
RBC: 3.3 MIL/uL — ABNORMAL LOW (ref 3.87–5.11)

## 2011-04-18 LAB — COMPREHENSIVE METABOLIC PANEL
AST: 8 U/L (ref 0–37)
Albumin: 1.8 g/dL — ABNORMAL LOW (ref 3.5–5.2)
Alkaline Phosphatase: 59 U/L (ref 39–117)
BUN: 47 mg/dL — ABNORMAL HIGH (ref 6–23)
Chloride: 97 mEq/L (ref 96–112)
Potassium: 4.2 mEq/L (ref 3.5–5.1)
Sodium: 139 mEq/L (ref 135–145)
Total Protein: 6.4 g/dL (ref 6.0–8.3)

## 2011-04-18 LAB — HEPARIN LEVEL (UNFRACTIONATED): Heparin Unfractionated: 0.65 IU/mL (ref 0.30–0.70)

## 2011-04-18 LAB — GLUCOSE, CAPILLARY
Glucose-Capillary: 190 mg/dL — ABNORMAL HIGH (ref 70–99)
Glucose-Capillary: 319 mg/dL — ABNORMAL HIGH (ref 70–99)

## 2011-04-18 LAB — MAGNESIUM: Magnesium: 2.3 mg/dL (ref 1.5–2.5)

## 2011-04-18 LAB — DIFFERENTIAL
Basophils Absolute: 0 10*3/uL (ref 0.0–0.1)
Basophils Relative: 0 % (ref 0–1)
Eosinophils Relative: 0 % (ref 0–5)
Lymphocytes Relative: 6 % — ABNORMAL LOW (ref 12–46)
Monocytes Absolute: 0.8 10*3/uL (ref 0.1–1.0)
Neutro Abs: 14.1 10*3/uL — ABNORMAL HIGH (ref 1.7–7.7)
Neutrophils Relative %: 89 % — ABNORMAL HIGH (ref 43–77)

## 2011-04-18 LAB — PROTIME-INR
INR: 1.21 (ref 0.00–1.49)
Prothrombin Time: 15.6 seconds — ABNORMAL HIGH (ref 11.6–15.2)

## 2011-04-18 LAB — TRIGLYCERIDES: Triglycerides: 203 mg/dL — ABNORMAL HIGH (ref <150)

## 2011-04-18 MED ORDER — IOHEXOL 300 MG/ML  SOLN
20.0000 mL | INTRAMUSCULAR | Status: AC
  Administered 2011-04-18 (×2): 20 mL via ORAL

## 2011-04-18 MED ORDER — FAT EMULSION 20 % IV EMUL
240.0000 mL | INTRAVENOUS | Status: AC
  Administered 2011-04-18: 240 mL via INTRAVENOUS
  Filled 2011-04-18: qty 250

## 2011-04-18 MED ORDER — TRACE MINERALS CR-CU-MN-SE-ZN 10-1000-500-60 MCG/ML IV SOLN
INTRAVENOUS | Status: AC
  Administered 2011-04-18: 19:00:00 via INTRAVENOUS
  Filled 2011-04-18: qty 2000

## 2011-04-18 MED ORDER — INSULIN GLARGINE 100 UNIT/ML ~~LOC~~ SOLN
20.0000 [IU] | Freq: Every day | SUBCUTANEOUS | Status: DC
  Administered 2011-04-18: 20 [IU] via SUBCUTANEOUS

## 2011-04-18 NOTE — Progress Notes (Signed)
Physical Therapy Treatment Patient Details Name: [name] MRN: [id number] DOB: 1931-01-05 Today's Date: 04/18/2011  PT Assessment/Plan  PT - Assessment/Plan Comments on Treatment Session: Much improved mobility over last session; less gout pain, and increased activity tol PT Plan: Discharge plan remains appropriate PT Frequency: Min 3X/week Follow Up Recommendations: Inpatient Rehab Equipment Recommended: Defer to next venue PT Goals  Acute Rehab PT Goals PT Goal Formulation: With patient Time For Goal Achievement: 7 days Pt will Roll Supine to Right Side: with supervision PT Goal: Rolling Supine to Right Side - Progress: Progressing toward goal Pt will Roll Supine to Left Side: with supervision PT Goal: Rolling Supine to Left Side - Progress: Progressing toward goal Pt will go Supine/Side to Sit: with supervision PT Goal: Supine/Side to Sit - Progress: Progressing toward goal Pt will go Sit to Supine/Side: with supervision PT Goal: Sit to Supine/Side - Progress: Progressing toward goal Pt will go Sit to Stand: with supervision PT Goal: Sit to Stand - Progress: Progressing toward goal Pt will go Stand to Sit: with supervision PT Goal: Stand to Sit - Progress: Progressing toward goal Pt will Ambulate: 51 - 150 feet;with least restrictive assistive device;with supervision PT Goal: Ambulate - Progress: Progressing toward goal Pt will Go Up / Down Stairs: 3-5 stairs;with supervision;with least restrictive assistive device PT Goal: Up/Down Stairs - Progress: Other (comment)  PT Treatment Precautions/Restrictions  Precautions Precautions: Fall Required Braces or Orthoses: No Restrictions Weight Bearing Restrictions: No Mobility (including Balance) Bed Mobility Supine to Sit: 3: Mod assist;HOB elevated (Comment degrees);With rails Supine to Sit Details (indicate cue type and reason): light mod assist; pt appropriately pulling up with handheld assist from therapist Sitting -  Scoot to Edge of Bed: 4: Min assist Sitting - Scoot to Edge of Bed Details (indicate cue type and reason): cues to scoot completely to EOB so that pt's feet are flat on the floor in prep for sit to stand; small, inefficient scoots Sit to Supine - Left: 1: +2 Total assist;Patient percentage (comment) (pt=30%) Sit to Supine - Left Details (indicate cue type and reason): good reciprocal scoot backwards in prep for sit to supine Scooting to HOB: 1: +2 Total assist Transfers Sit to Stand: 1: +2 Total assist;Patient percentage (comment);From bed;With upper extremity assist (pt = 65%) Sit to Stand Details (indicate cue type and reason): cues for safe transfer; required assist to steady RW; much improved activity tolerance Stand to Sit: 3: Mod assist;With upper extremity assist;To bed (to bed in prep for going to CT) Stand to Sit Details: cues for control , safety Ambulation/Gait Ambulation/Gait: Yes Ambulation/Gait Assistance: 1: +2 Total assist;Patient percentage (comment) (pt = 65%) Ambulation/Gait Assistance Details (indicate cue type and reason): sidesteps toward [facility]; physical assist for RW; decr step height, shuffle Ambulation Distance (Feet): 3 Feet Assistive device: Rolling walker Gait Pattern: Shuffle    Exercise  General Exercises - Lower Extremity Quad Sets: AROM;Right;Left;10 reps;Supine Straight Leg Raises: AROM;Right;Left;10 reps (alternating) End of Session PT - End of Session Equipment Utilized During Treatment: Gait belt Activity Tolerance: Patient limited by fatigue;Other (comment) Patient left: in bed;with call bell in reach (to go to CT) General Behavior During Session: [facility] for tasks performed Cognition: [facility] for tasks performed  Van Clines [facility] [place], [place] [phone]  04/18/2011, 1:09 PM

## 2011-04-18 NOTE — Progress Notes (Signed)
Ct unchanged essentially, would continue current therapy, can advance to fulls, Im not sure of etiology of this episode but is second one.  If she does not require operation she may need further evaluation to ensure source of abscesses.

## 2011-04-18 NOTE — Progress Notes (Addendum)
ANTICOAGULATION CONSULT NOTE - Follow Up Consult  Pharmacy Consult for: Heparin/Zosyn Indication: atrial fibrillation/diverticulitis  Allergies  Allergen Reactions  . Sulfonamide Derivatives     Childhood reaction   Vital Signs: Temp: 97.3 F (36.3 C) (01/07 0815) Temp src: Oral (01/07 0815) BP: 163/85 mmHg (01/07 0815) Pulse Rate: 84  (01/07 0815)  Labs:  Basename 04/18/11 0500 04/17/11 1750 04/17/11 0630 04/16/11 0600  HGB 8.8* -- 8.7* --  HCT 29.4* -- 29.4* 28.8*  PLT 229 -- 211 202  APTT -- -- -- --  LABPROT 15.6* -- 14.6 15.2  INR 1.21 -- 1.12 1.18  HEPARINUNFRC 0.65 0.19* 0.13* --  CREATININE 1.46* -- 1.54* 1.67*  CKTOTAL -- -- -- --  CKMB -- -- -- --  TROPONINI -- -- -- --   Estimated Creatinine Clearance: 35.5 ml/min (by C-G formula based on Cr of 1.46).   Medications:  Heparin @ 1850 units/hr Zosyn 3.375g IV q8  Assessment: 80yof on heparin for hx afib while coumadin on hold. Heparin level of 0.65 is at goal but heparin turned off today due to patient coughing up blood. Also on day #12 Zosyn for acute diverticulitis. Per Dr. Rosezena Sensor note on 1/6, Zosyn can be stopped today.  Goal of Therapy:  Heparin level 0.3-0.7 units/ml   Plan:  1) Follow up anticoagulation plan 2) Follow up d/c Zosyn  Fredrik Rigger 04/18/2011,8:54 AM

## 2011-04-18 NOTE — Progress Notes (Signed)
OT Cancellation Note  Treatment cancelled today due to patient receiving procedure or test.  [name],HILLARY 04/18/2011, 3:42 PM

## 2011-04-18 NOTE — Progress Notes (Signed)
Patient in radiology. I will follow up tomorrow with discussions concerning inpatient acute rehabilitation vs SNF for rehab when medially ready. Please call me with any questions. Pager [phone]

## 2011-04-18 NOTE — Progress Notes (Signed)
Subjective: Had a nose bleed last night and coughed up some clots this am, now seems to have resolved Breathing better, ankle and gout flare better  Objective: Blood pressure 163/85, pulse 84, temperature 97.3 F (36.3 C), temperature source Oral, resp. rate 18, height 5\' 3"  (1.6 m), weight 98.612 kg (217 lb 6.4 oz), SpO2 96.00%. Weight change:   Intake/Output Summary (Last 24 hours) at 04/18/11 1054 Last data filed at 04/18/11 0700  Gross per 24 hour  Intake   2477 ml  Output   3750 ml  Net  -1273 ml   Physical Exam: General appearance:  alert, cooperative, no distress.  Head: Normocephalic, without obvious abnormality, atraumatic   Lungs: Coarse to auscultation bilaterally, 3 L nasal cannula, respiratory effort shallow with diminished sounds in the bases, no tachypnea Heart: regular rate and rhythm, S1, S2 normal, no murmur, click, rub or gallop, IV fluids at keep open, IV heparin infusing Abdomen: distended, tympanic, soft, non tender, Bowel sounds present Extremities: Trace edema, improved swelling of left ankle joint, swelling and  right foot first MTP joint swelling down Lab Results:  Basename 04/18/11 0500 04/17/11 0630  NA 139 141  K 4.2 3.5  CL 97 99  CO2 34* 33*  GLUCOSE 224* 125*  BUN 47* 49*  CREATININE 1.46* 1.54*  CALCIUM 8.8 8.7  MG 2.3 1.6  PHOS 3.5 3.2    Basename 04/18/11 0500 04/17/11 0630  WBC 15.8* 14.6*  NEUTROABS 14.1* --  HGB 8.8* 8.7*  HCT 29.4* 29.4*  MCV 88.8 89.4  PLT 229 211    Assessment/Plan:  1. Acute hypoxemic and hypercarbic respiratory failure/question healthcare acquired pneumonia Markedly improved. Oxygen requirement improving Secondary to pleural effusions, and possible consolidation Continue Zosyn, continue low dose lasix BID, follow 1/0s,  back on home oxygen  2. Acute diffuse Diverticlulitis of the jejunum  There was no evidence of perforation or abscess formation on the initial CT scan of the abdomen performed  04/03/2011.  WBC improving,  Repeat CT concerning for what may be developing abscesses. She is on Zosyn.  Per CCS,tolerating clears For repeat CT today per CCS Suspect her nausea secondary to Flagyl, will add Zofran  3. Cdiff colitis: Continue metronidazole  4. Epistaxis and some hemoptysis: i have stopped her IV heparin Chest Xray unremarkable, clinically cardio-pulmonary status unchanged, check CBC again. If  recurs may need ENT or Pulm eval  5. Atrial fibrillation with Bradycardia  Rate controlled, stopped  IV heparin due to Epistaxis/hemoptysis Will need to hold off on resuming coumadin in near short term  8. DIABETES MELLITUS, improved, increase lantus, SSI  9. Chronic hypoxemic respiratory failure secondary to COPD  Back on home O2  10. Gouty flare: improved, DC Prednisone, continue colchicine   Dispo: will need SNF   LOS: 16 days   Tagen Milby [phone] 04/18/2011, 10:54 AM

## 2011-04-18 NOTE — Progress Notes (Signed)
  Subjective: Pt ok. Feeling better overall. States the diarrhea is slowing down. She denies much abd pain Reports coughing up some blood clot/mucous this am. Denies chest pain or SOB. Thinks it may be from small nosebleed that drained down back of throat.  Objective: Vital signs in last 24 hours: Temp:  [97.3 F (36.3 C)-99.2 F (37.3 C)] 97.3 F (36.3 C) (01/07 0815) Pulse Rate:  [77-90] 84  (01/07 0815) Resp:  [18-20] 18  (01/07 0815) BP: (135-163)/(53-87) 163/85 mmHg (01/07 0815) SpO2:  [96 %-98 %] 96 % (01/07 0848) Last BM Date: 04/17/11  Intake/Output this shift:    Physical Exam: BP 163/85  Pulse 84  Temp(Src) 97.3 F (36.3 C) (Oral)  Resp 18  Ht 5\' 3"  (1.6 m)  Wt 98.612 kg (217 lb 6.4 oz)  BMI 38.51 kg/m2  SpO2 96% Nose: no evidence of ant bleed, O2 from [place] Lungs: CTA Abdomen: soft, ND, mild generalized tenderness without peritonitis.  Labs: CBC  Basename 04/18/11 0500 04/17/11 0630  WBC 15.8* 14.6*  HGB 8.8* 8.7*  HCT 29.4* 29.4*  PLT 229 211   BMET  Basename 04/18/11 0500 04/17/11 0630  NA 139 141  K 4.2 3.5  CL 97 99  CO2 34* 33*  GLUCOSE 224* 125*  BUN 47* 49*  CREATININE 1.46* 1.54*  CALCIUM 8.8 8.7   LFT  Basename 04/18/11 0500  PROT 6.4  ALBUMIN 1.8*  AST 8  ALT 8  ALKPHOS 59  BILITOT 0.2*  BILIDIR --  IBILI --  LIPASE --   PT/INR  Basename 04/18/11 0500 04/17/11 0630  LABPROT 15.6* 14.6  INR 1.21 1.12   ABG No results found for this basename: PHART:2,PCO2:2,PO2:2,HCO3:2 in the last 72 hours  Studies/Results: No results found.  Assessment: Principal Problem:  *Diverticulitis of jejunum Active Problems:  DIABETES MELLITUS, TYPE II  Campath-induced atrial fibrillation  DIASTOLIC HEART FAILURE, CHRONIC  COPD  RENAL FAILURE, CHRONIC  Fever  Bradycardia  Atelectasis  Hyperkalemia  C. difficile colitis   Plan: Continue Flagyl. Needs repeat CT, would ideally want IV contrast but renal fxn would prevent  that. Can proceed with po contrast only CT to re-eval fluid collections as WBC continues to remain elevated. Overall she looks much better.  LOS: 16 days    Marianna Fuss 04/18/2011

## 2011-04-18 NOTE — Progress Notes (Signed)
PARENTERAL NUTRITION CONSULT NOTE - FOLLOW UP  Pharmacy Consult for TPN Indication: prolonged ileus  Vital Signs: Temp: 97.3 F (36.3 C) (01/07 0815) Temp src: Oral (01/07 0815) BP: 163/85 mmHg (01/07 0815) Pulse Rate: 84  (01/07 0815)  Intake/Output from previous day: 01/06 0701 - 01/07 0700 In: 2809 [P.O.:480; I.V.:697; IV Piggyback:300; TPN:1332] Out: 3750 [Urine:3750]  Labs:  [facility] 04/18/11 0500 04/17/11 0630 04/16/11 0600  WBC 15.8* 14.6* 18.4*  HGB 8.8* 8.7* 8.5*  HCT 29.4* 29.4* 28.8*  PLT 229 211 202  APTT -- -- --  INR 1.21 1.12 1.18     Basename 04/18/11 0500 04/17/11 0630 04/16/11 0600  NA 139 141 139  K 4.2 3.5 4.1  CL 97 99 99  CO2 34* 33* 31  GLUCOSE 224* 125* 241*  BUN 47* 49* 50*  CREATININE 1.46* 1.54* 1.67*  LABCREA -- -- --  CREAT24HRUR -- -- --  CALCIUM 8.8 8.7 8.7  MG 2.3 1.6 2.0  PHOS 3.5 3.2 4.8*  PROT 6.4 -- --  ALBUMIN 1.8* -- --  AST 8 -- --  ALT 8 -- --  ALKPHOS 59 -- --  BILITOT 0.2* -- --  BILIDIR -- -- --  IBILI -- -- --  PREALBUMIN -- -- --  TRIG 203* -- --  CHOLHDL -- -- --  CHOL 141 -- --   Estimated Creatinine Clearance: 35.5 ml/min (by C-G formula based on Cr of 1.46).    Basename 04/18/11 0618 04/18/11 0001 04/17/11 2357  GLUCAP 222* 319* 334*   Insulin Requirements in the past 24 hours:  18 units SSI since 1800 yesterday 10 units insulin in TPN 15 units lantus qHS CBGs elevated  Current Nutrition:  Clinimix E 5/15 at 14ml/hr + lipids at 3ml/hr MWF which provides average of ~1400 kcal/84 gm protein per day  Assessment: Diverticulitis with abscess and ileus.    CBGs high past 12 hours - prednisone d/c yesterday p.m. but still think that patient will need increased dose of insulin in TPN  Triglycerides trending down   Lytes: WNL  Nutritional Goals:  New goal: 1700-1900 kCal, 75-85 grams of protein per day  Plan:  1. Continue clinimix E 5/15 at 70 ml/hr. 2. Lipids at 10 ml/hr + TE + MVI MWF d/t  national shortage 3.  Increase insulin in TPN 4.  F/u am labs and pending prealbumin 5.  F/u ability to advance diet and start to wean TPN   Christoper Fabian, PharmD, BCPS Clinical Pharmacist Pager: [phone] 04/18/2011 8:47 AM

## 2011-04-19 ENCOUNTER — Inpatient Hospital Stay
Admission: AD | Admit: 2011-04-19 | Discharge: 2011-05-19 | Disposition: A | Discharge status: Rehabilitation Facility | Payer: Self-pay | Source: Ambulatory Visit | Attending: Internal Medicine | Admitting: Internal Medicine

## 2011-04-19 LAB — GLUCOSE, CAPILLARY
Glucose-Capillary: 110 mg/dL — ABNORMAL HIGH (ref 70–99)
Glucose-Capillary: 156 mg/dL — ABNORMAL HIGH (ref 70–99)

## 2011-04-19 LAB — PROTIME-INR
INR: 1.1 (ref 0.00–1.49)
Prothrombin Time: 14.4 seconds (ref 11.6–15.2)

## 2011-04-19 LAB — BASIC METABOLIC PANEL
CO2: 34 mEq/L — ABNORMAL HIGH (ref 19–32)
Calcium: 9 mg/dL (ref 8.4–10.5)
GFR calc non Af Amer: 35 mL/min — ABNORMAL LOW (ref >90)
Glucose, Bld: 107 mg/dL — ABNORMAL HIGH (ref 70–99)
Potassium: 3.6 mEq/L (ref 3.5–5.1)
Sodium: 142 mEq/L (ref 135–145)

## 2011-04-19 LAB — CBC
HCT: 29.3 % — ABNORMAL LOW (ref 36.0–46.0)
Hemoglobin: 8.6 g/dL — ABNORMAL LOW (ref 12.0–15.0)
MCHC: 29.4 g/dL — ABNORMAL LOW (ref 30.0–36.0)
WBC: 11.8 10*3/uL — ABNORMAL HIGH (ref 4.0–10.5)

## 2011-04-19 LAB — HEPARIN LEVEL (UNFRACTIONATED): Heparin Unfractionated: <0.1 IU/mL — ABNORMAL LOW (ref 0.30–0.70)

## 2011-04-19 MED ORDER — FUROSEMIDE 40 MG PO TABS: 20.0000 mg | ORAL_TABLET | Freq: Every day | ORAL | Status: DC

## 2011-04-19 MED ORDER — IPRATROPIUM-ALBUTEROL 18-103 MCG/ACT IN AERO: 2.0000 | INHALATION_SPRAY | Freq: Two times a day (BID) | RESPIRATORY_TRACT | Status: DC

## 2011-04-19 MED ORDER — INSULIN GLARGINE 100 UNIT/ML ~~LOC~~ SOLN: 20.0000 [IU] | Freq: Every day | SUBCUTANEOUS | Status: DC

## 2011-04-19 MED ORDER — FLUTICASONE PROPIONATE HFA 110 MCG/ACT IN AERO: 2.0000 | INHALATION_SPRAY | Freq: Two times a day (BID) | RESPIRATORY_TRACT | Status: DC

## 2011-04-19 MED ORDER — FAT EMULSION 20 % IV EMUL: 240.0000 mL | INTRAVENOUS | Status: DC

## 2011-04-19 MED ORDER — METRONIDAZOLE 500 MG PO TABS: 500.0000 mg | ORAL_TABLET | Freq: Three times a day (TID) | ORAL | Status: AC

## 2011-04-19 MED ORDER — INSULIN REGULAR HUMAN 100 UNIT/ML IJ SOLN
INTRAVENOUS | Status: DC
  Administered 2011-04-19: 18:00:00 via INTRAVENOUS
  Filled 2011-04-19: qty 2000

## 2011-04-19 MED ORDER — METOPROLOL TARTRATE 25 MG PO TABS: 12.5000 mg | ORAL_TABLET | Freq: Two times a day (BID) | ORAL | Status: DC

## 2011-04-19 MED ORDER — INSULIN ASPART 100 UNIT/ML ~~LOC~~ SOLN: 0.0000 [IU] | Freq: Four times a day (QID) | SUBCUTANEOUS | Status: DC

## 2011-04-19 MED ORDER — BOOST / RESOURCE BREEZE PO LIQD: 240.0000 mL | Freq: Three times a day (TID) | ORAL | Status: DC

## 2011-04-19 MED ORDER — POTASSIUM CHLORIDE CRYS ER 20 MEQ PO TBCR
20.0000 meq | EXTENDED_RELEASE_TABLET | Freq: Two times a day (BID) | ORAL | Status: DC
  Administered 2011-04-19: 20 meq via ORAL
  Filled 2011-04-19: qty 1

## 2011-04-19 MED ORDER — COLCHICINE 0.6 MG PO TABS: 0.6000 mg | ORAL_TABLET | Freq: Every day | ORAL | Status: DC

## 2011-04-19 MED ORDER — IPRATROPIUM-ALBUTEROL 18-103 MCG/ACT IN AERO
2.0000 | INHALATION_SPRAY | Freq: Two times a day (BID) | RESPIRATORY_TRACT | Status: DC
  Filled 2011-04-19: qty 14.7

## 2011-04-19 MED ORDER — ONDANSETRON HCL 4 MG/2ML IJ SOLN: 4.0000 mg | Freq: Four times a day (QID) | INTRAMUSCULAR | Status: DC | PRN

## 2011-04-19 MED ORDER — MORPHINE SULFATE 2 MG/ML IJ SOLN: 1.0000 mg | INTRAMUSCULAR | Status: DC | PRN

## 2011-04-19 MED ORDER — POTASSIUM CHLORIDE CRYS ER 20 MEQ PO TBCR: 20.0000 meq | EXTENDED_RELEASE_TABLET | Freq: Every day | ORAL | Status: DC

## 2011-04-19 NOTE — Discharge Summary (Addendum)
Physician Discharge Summary  Patient ID: [name] MRN: [id number] DOB/AGE: 1930/08/06 78 y.o.  Admit date: 04/02/2011 Discharge date: 04/19/2011  Primary Care Physician:  Hannah Beat, MD, MD   Discharge Diagnoses:   1. Diverticulitis of jejunum 2. S/p ventilator-dependent respiratory failure 3. Healthcare care associated pneumonia 4. C. difficile colitis 5.  DIABETES MELLITUS, TYPE II 6.  Atrial fibrillation  Coumadin discontinued for the short-term 7. Acute on chronic DIASTOLIC HEART FAILURE 8.  COPD on 2.5 L home O2 9. 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 Epistaxis resolved 11. Scant hemoptysis resolved 12. Severe deconditioning 13. Obstructive sleep apnea 14. Gouty flare  15. Cholelithiasis  Current Discharge Medication List    START taking these medications   Details  albuterol-ipratropium (COMBIVENT) 18-103 MCG/ACT inhaler Inhale 2 puffs into the lungs 2 (two) times daily.    colchicine 0.6 MG tablet Take 1 tablet (0.6 mg total) by mouth daily. Qty: 1 tablet    fat emulsion 20 % infusion Inject 240 mLs into the vein continuous. Qty: 250 mL, Refills: 0    feeding supplement (RESOURCE BREEZE) LIQD Take 1 Container by mouth 3 (three) times daily with meals.    fluticasone (FLOVENT HFA) 110 MCG/ACT inhaler Inhale 2 puffs into the lungs 2 (two) times daily. Qty: 1 Inhaler    insulin aspart (NOVOLOG) 100 UNIT/ML injection Inject 0-15 Units into the skin every 6 (six) hours. Qty: 1 vial, Refills: 0    insulin glargine (LANTUS) 100 UNIT/ML injection Inject 20 Units into the skin at bedtime. Qty: 10 mL, Refills: 0    metroNIDAZOLE (FLAGYL) 500 MG tablet Take 1 tablet (500 mg total) by mouth every 8 (eight) hours.    morphine 2 MG/ML injection Inject 0.5 mLs (1 mg total) into the vein every 4 (four) hours as needed. Qty: 1 mL    ondansetron (ZOFRAN) 4 MG/2ML SOLN Inject 2 mLs (4 mg total) into the vein every 6 (six) hours as  needed. Qty: 15 mL    potassium chloride SA (K-DUR,KLOR-CON) 20 MEQ tablet Take 1 tablet (20 mEq total) by mouth daily.      CONTINUE these medications which have CHANGED   Details  furosemide (LASIX) 40 MG tablet Take 0.5 tablets (20 mg total) by mouth daily. Qty: 30 tablet, Refills: 0    metoprolol tartrate (LOPRESSOR) 25 MG tablet Take 0.5 tablets (12.5 mg total) by mouth 2 (two) times daily.      CONTINUE these medications which have NOT CHANGED   Details  albuterol (PROVENTIL) (2.5 MG/3ML) 0.083% nebulizer solution Take 3 mLs (2.5 mg total) by nebulization every 6 (six) hours as needed for wheezing. Qty: 75 mL, Refills: 12    ranitidine (ZANTAC) 150 MG capsule Take 150 mg by mouth every evening.      simvastatin (ZOCOR) 20 MG tablet Take 1 tablet (20 mg total) by mouth at bedtime. Qty: 90 tablet, Refills: 3    tiotropium (SPIRIVA) 18 MCG inhalation capsule Place 1 capsule (18 mcg total) into inhaler and inhale daily. Qty: 30 capsule, Refills: 11      STOP taking these medications     albuterol (VENTOLIN HFA) 108 (90 BASE) MCG/ACT inhaler      amLODipine (NORVASC) 10 MG tablet      fenofibrate (TRICOR) 48 MG tablet      magnesium oxide (MAG-OX) 400 MG tablet      Mometasone Furo-Formoterol Fum (DULERA) 200-5 MCG/ACT AERO      triamcinolone (KENALOG)  0.1 % cream      warfarin (COUMADIN) 2 MG tablet      NON FORMULARY      predniSONE (DELTASONE) 20 MG tablet      warfarin (COUMADIN) 2 MG tablet          Disposition and Follow-up:  Surgery will follow at [facility] for capsule endoscopy to evaluate small bowel  Consults:  1. [place] surgery 2. pulmonary critical care medicine  Significant Diagnostic Studies:  Ct Abdomen Pelvis Wo Contrast 04/03/2011   IMPRESSION:  1.  Acute diffuse diverticulitis involving numerous prominent diverticula along the jejunum, more diffuse than in 2010. Significant soft tissue inflammation noted, with free fluid  tracking about Gerota's fascia to the left paracolic gutter, about the liver and spleen, and within the pelvis.  No definite evidence of perforation or abscess formation. 2.  Diffuse diverticulosis along the sigmoid colon, without evidence of diverticulitis. 3.  Bladder diverticula again noted. 4.  Diffuse calcification along the abdominal aorta and its branches. 5.  Diffuse degenerative change along the lumbar spine, with complete osseous fusion at L4-L5. 6.  Mild bilateral renal atrophy noted. 7.  Scattered coronary artery calcifications seen. 8.  Bibasilar atelectasis noted.  Original Report Authenticated By: Tonia Ghent, M.D.   Dg Chest Port 1 View 04/02/2011  .  IMPRESSION: Cardiomegaly, vascular congestion.  Original Report Authenticated By: Cyndie Chime, M.D.   Ct Abdomen 1/7:IMPRESSION: Fluid collections adjacent to the jejunum are essentially stable.  Nodular contours of the liver. Appearance is suggestive of  cirrhosis. There is ascites, not significantly changed since prior  study. Small bilateral pleural effusions with compressive atelectasis in  the lower lobes. Cholelithiasis.   Brief H and P: [name] is a 78 year old female with multiple medical problems presented to the hospital with abdominal pain for 10 days on evaluation in the emergency room she was found to be bradycardic with a CT of her abdomen consistent with diverticulitis of her small bowel with sepsis.  Hospital course:  1. Acute hypoxemic and hypercarbic respiratory failure/question healthcare acquired pneumonia  On day 3 of her hospitalization she deteriorated from a pulmonary standpoint which is felt to be secondary to sepsis, healthcare associated pneumonia and a component of volume overload requiring mechanical intubation and was transferred to the ICU, subsequently improved from a cardiopulmonary standpoint completed a 10 day course of antibiotics for healthcare associated pneumonia, and continues to be  diuresed, oxygenation is back to her baseline home O2 of 2.5 L that she is on for COPD  2. Acute diffuse Diverticlulitis of the jejunum  There was no evidence of perforation or abscess formation on the initial CT scan of the abdomen performed 04/03/2011. Repeat CT scan performed 04/06/2011 shows ventral mesenteric stranding with a 2.5 x 2.3 cm fluid collection within the deep ventral mesentery  She has been followed by surgery throughout her hospitalization and continued on Zosyn, she was made good clinical progress despite the CT scan and her last scan on 1/8 still showed that she had some fluid collections which are stable. He had discontinued her Zosyn yesterday since she developed C. difficile and continues to recover from diverticulitis standpoint. She has completed 2 weeks of treatment with Zosyn as well. She also had partial small bowel obstruction associated with diverticulitis which has since resolved require NG decompression intermittently which has been discontinued now her diet is being advanced by surgery she is on a full liquid diet at this point, she also continues to  be on total parenteral nutrition which will be weaned by surgery. After her recovery she will need followup evaluation by GI likely need a capsule endoscopy to evaluate the cause of recurrent small bowel diverticulitis  3. C. difficile colitis:  Improving, on day 3 of Flagyl will require 7 more days for a ten-day course. I have also discontinued her protonix and preferably avoid future antibiotics unless absent absolutely indicated.  4.Atrial fibrillation she did have instances of RVR and occasional bradycardia, and he has been stabilized and rate controlled on low-dose metoprolol. She had been on IV heparin through most of her hospital course since her Coumadin had to be held due to the ongoing bowel issues. However her heparin had to be discontinued 3 days ago due to epistaxis and occasional hemoptysis. This has since  resolved, Coumadin can be resumed in the next one to 2 weeks if no further bleeding issues noted.  5. Acute renal failure/ hyperkalemia-resolved creatinine back to baseline, has CKD stage 4/ANCA+ pauci-immune glomerulonephritis   6. DIABETES MELLITUS, TYPE II /Hyperglycemia   continues on Lantus and sliding scale  7. Chronic hypoxemic respiratory failure secondary to COPD : Stable back on home O2 at 2.5 L   8. Gouty flare involving the right ankle and left first toe MTP joint improved treated with a short course of prednisone, continue low dose colchicine, monitor kidney function closely while she stays on colchicine   9. Deconditioning: Will need prolonged rehabilitation  Time spent on Discharge:  Signed: Temitayo Covalt [facility]  04/19/2011, 4:22 PM   Addendum: Patient to continue on TPN and fat emulsion

## 2011-04-19 NOTE — Progress Notes (Signed)
PT Note: Attempted PT tx, but pt refused due to stomach pain and fatigued from activity today. Will reattempt tomorrow as schedule allows.  Clydene Laming, [place] [phone]  04/19/2011 15:27

## 2011-04-19 NOTE — Progress Notes (Signed)
Occupational Therapy Treatment Patient Details Name: ALECEA TREGO MRN: [id number] DOB: 1930-08-22 Today's Date: 04/19/2011  OT Assessment/Plan OT Assessment/Plan Comments on Treatment Session: Excellent particpation. Pt would greatly benefit from CIR> OT Plan: Frequency remains appropriate;Discharge plan remains appropriate Follow Up Recommendations: Inpatient Rehab Equipment Recommended: Defer to next venue OT Goals ADL Goals Pt Will Perform Grooming: with min assist;with set-up;Standing at sink Pt Will Perform Lower Body Bathing: with min assist;Sit to stand from bed Pt Will Perform Lower Body Dressing: with min assist;Sit to stand from bed;Unsupported;with adaptive equipment Pt Will Transfer to Toilet: with mod assist;3-in-1;Stand pivot transfer Pt Will Perform Toileting - Hygiene: with min assist;Sit to stand from 3-in-1/toilet See navigator for progress OT Treatment Precautions/Restrictions  Precautions Precautions: Fall Restrictions Weight Bearing Restrictions: No   ADL ADL Eating/Feeding: Not assessed Grooming: Wash/dry hands;Wash/dry face Where Assessed - Grooming: Sitting at sink Upper Body Bathing: Chest;Right arm;Left arm;Abdomen;Set up;Performed Where Assessed - Upper Body Bathing: Sitting at sink Lower Body Bathing: Maximal assistance;Performed Where Assessed - Lower Body Bathing: Standing at sink;Sitting at sink Where Assessed - Upper Body Dressing: Sitting, chair Lower Body Dressing: +1 Total assistance;Performed Where Assessed - Lower Body Dressing: Standing;Sitting, chair Toilet Transfer: Not assessed ADL Comments: Required multiple breaks to complete ADL. Excellent participation. Mobility  Bed Mobility Bed Mobility: Yes Rolling Right: 4: Min assist Right Sidelying to Sit: 3: Mod assist Supine to Sit: 3: Mod assist;HOB elevated (Comment degrees);With rails Sitting - Scoot to Edge of Bed: 4: Min assist Transfers Transfers: Yes Sit to Stand: With  upper extremity assist;2: Max assist;From bed    End of Session OT - End of Session Equipment Utilized During Treatment: Gait belt Activity Tolerance: Patient limited by fatigue Patient left: in chair;with call bell in reach Nurse Communication: Mobility status for transfers General Behavior During Session: [facility] for tasks performed Cognition: [facility] for tasks performed  Konnor Jorden,HILLARY  04/19/2011, 1:30 PM [facility], OTR/L  [phone] 04/19/2011

## 2011-04-19 NOTE — Progress Notes (Signed)
Report phoned to [facility], Rn. Pt. [facility] Room 5712. Copied chart sent with pt.

## 2011-04-19 NOTE — Progress Notes (Signed)
Discussed again with patient the 3 hrs per day of therapy required for inpatient acute rehabilitation. Patient does not want SNF rehab, but states she is not up for 3 hrs per day therapy. Patient may be a candidate for LTACH with her TNA , diet, and therapy needs. Recommend LTACH referral. Please call with any questions. Pager [phone].

## 2011-04-19 NOTE — Progress Notes (Signed)
   CARE MANAGEMENT NOTE 04/19/2011  Patient:  [name], [name]   Account Number:  [id number]  Date Initiated:  04/04/2011  Documentation initiated by:  Onnie Boer  Subjective/Objective Assessment:   PT WAS ADMITTED WITH DIVERTICULITIS     Action/Plan:   PROGRESSION OF CARE AND DISCHARGE PLANNING   Anticipated DC Date:  04/20/2011   Anticipated DC Plan:  HOME W HOME HEALTH SERVICES  In-house referral  Clinical Social Worker      DC Planning Services  CM consult      [facility] Choice  LONG TERM ACUTE CARE   Choice offered to / List presented to:             Status of service:  In process, will continue to follow Medicare Important Message given?   (If response is "NO", the following Medicare IM given date fields will be blank) Date Medicare IM given:   Date Additional Medicare IM given:    Discharge Disposition:    Per UR Regulation:  Reviewed for med. necessity/level of care/duration of stay  Comments:  1/8 spoke w dr Jomarie Longs and she did [facility] have asked them to ck for elidg. debbie Jahlon Baines rn,bsn 1/7 have put in for sw as pt notes states not progressing w therapy, debbie Noam Karaffa rn,bsn 1/3 pt and ot rec inpt rehab, will ask md to ask for cir consult, debbie Rainelle Sulewski rn,bsn [phone]  04-08-11 7:35am Avie Arenas, RNBSN [phone] UR Completed.  tx to ICU - resp failure - intubated.  UR COMPLETED 04/04/2011 JENNIFER CLARK, RN,BSN [phone] PT WAS ADMITTED WITH DIVERTICULITIS AND HR IN THE 40'S.  PT WILL NEED PT AT DC, WILL F/U

## 2011-04-19 NOTE — Progress Notes (Signed)
PARENTERAL NUTRITION CONSULT NOTE - FOLLOW UP  Pharmacy Consult for TPN Indication: prolonged ileus  Vital Signs: Temp: 97.8 F (36.6 C) (01/08 0544) BP: 142/75 mmHg (01/08 0544) Pulse Rate: 80  (01/08 0544)  Intake/Output from previous day: 01/07 0701 - 01/08 0700 In: 360 [P.O.:360] Out: 3200 [Urine:3200] Ins not recorded accurately  Labs:  [facility] 04/19/11 0531 04/18/11 0830 04/18/11 0500 04/17/11 0630  WBC 11.8* 16.1* 15.8* --  HGB 8.6* 8.9* 8.8* --  HCT 29.3* 29.3* 29.4* --  PLT 216 217 229 --  APTT -- -- -- --  INR 1.10 -- 1.21 1.12     Basename 04/19/11 0531 04/18/11 0500 04/17/11 0630  NA 142 139 141  K 3.6 4.2 3.5  CL 101 97 99  CO2 34* 34* 33*  GLUCOSE 107* 224* 125*  BUN 47* 47* 49*  CREATININE 1.41* 1.46* 1.54*  LABCREA -- -- --  CREAT24HRUR -- -- --  CALCIUM 9.0 8.8 8.7  MG -- 2.3 1.6  PHOS -- 3.5 3.2  PROT -- 6.4 --  ALBUMIN -- 1.8* --  AST -- 8 --  ALT -- 8 --  ALKPHOS -- 59 --  BILITOT -- 0.2* --  BILIDIR -- -- --  IBILI -- -- --  PREALBUMIN -- 15.9* --  TRIG -- 203* --  CHOLHDL -- -- --  CHOL -- 141 --   Estimated Creatinine Clearance: 36.8 ml/min (by C-G formula based on Cr of 1.41).    Basename 04/19/11 0647 04/19/11 0040 04/18/11 2146  GLUCAP 110* 156* 170*   Insulin Requirements in the past 24 hours:  6 units SSI since 1800 yesterday 20 units insulin in TPN 20 units lantus qHS  Current Nutrition:  Clinimix E 5/15 at 7ml/hr + lipids at 4ml/hr MWF which provides average of ~1400 kcal/84 gm protein per day. Pt also eating some of full liquid diet.  Assessment: Diverticulitis with abscess and ileus. Po intake + TPN should be meeting ~100% pt's estimated needs.   CBGs better past 12 hours with increased insulin in TPN and increased lantus and prednisone d/c. Will watch.  Prealbumin 15.9 - trending up from 9 last week  Lytes: Potassium 3.6 (in pt with afib- would like ~4)  Nutritional Goals:  New goal: 1700-1900 kCal,  75-85 grams of protein per day  Plan:  1. Continue clinimix E 5/15 at 70 ml/hr and Lipids at 10 ml/hr + TE + MVI MWF d/t Sport and exercise psychologist.  2.  KCl 20 mEq po bid x 2 doses today 3.  F/u ability to advance diet and wean TPN 4.  F/u BMET in a.m.   Christoper Fabian, PharmD, BCPS Clinical Pharmacist Pager: [phone] 04/19/2011 8:42 AM

## 2011-04-19 NOTE — Progress Notes (Signed)
Subjective: Patient feels better this morning. Her pain is decreased, she has had no episodes of diarrhea this morning (although she had several episodes yesterday), she does not like her clear diet but enjoyed her grits this morning and denies any nausea/vomiting. She denies coughing up any more blood.   Objective: Vital signs in last 24 hours: Temp:  [97.8 F (36.6 C)-98.2 F (36.8 C)] 97.8 F (36.6 C) (01/08 0544) Pulse Rate:  [61-80] 80  (01/08 0544) Resp:  [18-20] 18  (01/08 0544) BP: (119-152)/(65-75) 142/75 mmHg (01/08 0544) SpO2:  [93 %-96 %] 93 % (01/07 2113) Last BM Date: 04/18/11  Intake/Output from previous day: 01/07 0701 - 01/08 0700 In: 360 [P.O.:360] Out: 3200 [Urine:3200] Intake/Output this shift:   Physical exam: Lungs: wearing [place], no increased WOB Abd: soft, mild tenderness LLQ without guarding or rebound, obese, distended  Lab Results:   Basename 04/19/11 0531 04/18/11 0830  WBC 11.8* 16.1*  HGB 8.6* 8.9*  HCT 29.3* 29.3*  PLT 216 217   BMET  Basename 04/19/11 0531 04/18/11 0500  NA 142 139  K 3.6 4.2  CL 101 97  CO2 34* 34*  GLUCOSE 107* 224*  BUN 47* 47*  CREATININE 1.41* 1.46*  CALCIUM 9.0 8.8   PT/INR  Basename 04/19/11 0531 04/18/11 0500  LABPROT 14.4 15.6*  INR 1.10 1.21   ABG No results found for this basename: PHART:2,PCO2:2,PO2:2,HCO3:2 in the last 72 hours  Studies/Results: Ct Abdomen Pelvis Wo Contrast  04/18/2011  *RADIOLOGY REPORT*  Clinical Data: Jejunal diverticulitis.  Multiple fluid collections. Follow-up.  CT ABDOMEN AND PELVIS WITHOUT CONTRAST  Technique:  Multidetector CT imaging of the abdomen and pelvis was performed following the standard protocol without intravenous contrast.  Comparison: 04/11/2011  Findings: There are bilateral pleural effusions and compressive atelectasis in the lower lobes, essentially stable since prior study.  Mild cardiomegaly.  Changes suggestive of cirrhosis with nodularity in the liver  surface and enlarged left hepatic lobe.  Small amount of perihepatic ascites, decreased since prior study and.  Small amount of pelvic ascites is stable. Fluid in the left paracolic gutter increased.  There is cholelithiasis.  Spleen, pancreas, adrenals and kidneys have an unremarkable unenhanced appearance.  Again noted are several small fluid collection is adjacent to in the left abdomen.  These are essentially unchanged when compared to prior studies.  The largest currently measures the largest currently measures 4.8 cm.  There is sigmoid diverticulosis.  No changes of active colonic diverticulitis.  No evidence of bowel obstruction.  Urinary bladder decompressed with Foley catheter in place.  Aorta is heavily calcified, non-aneurysmal.  Degenerative changes in the thoracolumbar spine.  IMPRESSION: Fluid collections adjacent to the jejunum are essentially stable.  Nodular contours of the liver.  Appearance is suggestive of cirrhosis.  There is ascites, not significantly changed since prior study.  Small bilateral pleural effusions with compressive atelectasis in the lower lobes.  Cholelithiasis.  Original Report Authenticated By: Cyndie Chime, M.D.   Dg Chest Port 1 View  04/18/2011  *RADIOLOGY REPORT*  Clinical Data: Pneumonia, shortness of breath  PORTABLE CHEST - 1 VIEW  Comparison: 04/14/2011  Findings: Study is limited by patient's large body habitus. The cardiomediastinal silhouette is stable.  No pulmonary edema.  Right IJ central line with tip in distal SVC is unchanged in position. Again noted bilateral basilar atelectasis or infiltrate.  IMPRESSION: No pulmonary edema.  Again noted bilateral basilar atelectasis or infiltrate.  Original Report Authenticated By: Natasha Mead, M.D.  Anti-infectives: Anti-infectives     Start     Dose/Rate Route Frequency Ordered Stop   04/16/11 1400   metroNIDAZOLE (FLAGYL) tablet 500 mg        500 mg Oral 3 times per day 04/16/11 1027     04/15/11 2300    metroNIDAZOLE (FLAGYL) tablet 500 mg  Status:  Discontinued        500 mg Oral 3 times per day 04/15/11 2253 04/15/11 2254   04/15/11 2300   metroNIDAZOLE (FLAGYL) 50 mg/ml oral suspension 500 mg  Status:  Discontinued        500 mg Oral 3 times daily 04/15/11 2254 04/16/11 1130   04/10/11 1400  piperacillin-tazobactam (ZOSYN) IVPB 3.375 g       3.375 g 12.5 mL/hr over 240 Minutes Intravenous 3 times per day 04/10/11 1135     04/07/11 1200   vancomycin (VANCOCIN) IVPB 1000 mg/200 mL premix  Status:  Discontinued        1,000 mg 200 mL/hr over 60 Minutes Intravenous Every 24 hours 04/06/11 0941 04/10/11 1152   04/06/11 1200   vancomycin (VANCOCIN) 1,500 mg in sodium chloride 0.9 % 500 mL IVPB        1,500 mg 250 mL/hr over 120 Minutes Intravenous  Once 04/06/11 0941 04/06/11 1343   04/06/11 1200   piperacillin-tazobactam (ZOSYN) IVPB 2.25 g  Status:  Discontinued        2.25 g 100 mL/hr over 30 Minutes Intravenous 4 times per day 04/06/11 0941 04/10/11 1135   04/04/11 0000   ciprofloxacin (CIPRO) IVPB 400 mg  Status:  Discontinued        400 mg 200 mL/hr over 60 Minutes Intravenous Every 24 hours 04/03/11 0036 04/06/11 0847   04/03/11 0045   ciprofloxacin (CIPRO) IVPB 400 mg        400 mg 200 mL/hr over 60 Minutes Intravenous NOW 04/03/11 0036 04/03/11 0145   04/02/11 2359   metroNIDAZOLE (FLAGYL) IVPB 500 mg  Status:  Discontinued        500 mg 100 mL/hr over 60 Minutes Intravenous Every 8 hours 04/02/11 2328 04/06/11 0847   04/02/11 2115   cefTRIAXone (ROCEPHIN) 1 g in dextrose 5 % 50 mL IVPB        1 g 100 mL/hr over 30 Minutes Intravenous  Once 04/02/11 2110 04/02/11 2314          Assessment:  Principal Problem:  *Diverticulitis of jejunum  Active Problems:  DIABETES MELLITUS, TYPE II  Campath-induced atrial fibrillation  DIASTOLIC HEART FAILURE, CHRONIC  COPD  RENAL FAILURE, CHRONIC  Fever  Bradycardia  Atelectasis  Hyperkalemia  C. difficile colitis    Plan:  Patient is continuing to improve. WBC is trending down.  -Continue Flagyl for C. diff. -Discussed CT-abdomen results performed yesterday with patient showing stable abdominal findings.  -Patient on full liquid diet currently. Will consider advancing today. Will start weaning off TPN now that she is tolerating her diet.     LOS: 17 days    [place] PARK, Robinette Esters 04/19/2011

## 2011-04-19 NOTE — Progress Notes (Addendum)
Nutrition Follow-up  Diet Order:  Dysphagia 3, thin liquids. PO intake 0-25% per records. Resource Breeze supplement TID.  Patient is receiving TPN with Clinimix E 5/15 @ 70 ml/hr.  Lipids (20% IVFE @ 10 ml/hr), multivitamins, and trace elements are provided 3 times weekly (MWF) due to national backorder.  Provides 1398 kcal and 84 grams protein daily (based on weekly average).  Meets 82% minimum estimated kcal and 112% minimum estimated protein needs.  Meds: Scheduled Meds:   . albuterol-ipratropium  2 puff Inhalation BID  . antiseptic oral rinse  15 mL Mouth Rinse QID  . colchicine  0.6 mg Oral Daily  . feeding supplement  240 mL Oral TID WC  . fluticasone  2 puff Inhalation BID  . furosemide  20 mg Oral Daily  . insulin aspart  0-15 Units Subcutaneous Q6H  . insulin glargine  20 Units Subcutaneous QHS  . LORazepam  0.5 mg Sublingual QHS  . metroNIDAZOLE  500 mg Oral Q8H  . pantoprazole (PROTONIX) IV  40 mg Intravenous Q24H  . potassium chloride  20 mEq Oral BID  . sodium chloride  10 mL Intracatheter Q12H  . DISCONTD: albuterol  2 puff Inhalation BID  . DISCONTD: ipratropium  2 puff Inhalation BID  . DISCONTD: piperacillin-tazobactam (ZOSYN)  IV  3.375 g Intravenous Q8H   Continuous Infusions:   . sodium chloride 20 mL/hr at 04/17/11 0958  . fat emulsion 240 mL (04/18/11 1831)  . TPN (CLINIMIX) +/- additives 70 mL/hr at 04/17/11 1733  . TPN (CLINIMIX) +/- additives    . TPN (CLINIMIX) +/- additives 70 mL/hr at 04/18/11 1830   PRN Meds:.acetaminophen, diphenhydrAMINE, morphine injection, ondansetron (ZOFRAN) IV, sodium chloride  Labs:  CMP     Component Value Date/Time   NA 142 04/19/2011 0531   K 3.6 04/19/2011 0531   CL 101 04/19/2011 0531   CO2 34* 04/19/2011 0531   GLUCOSE 107* 04/19/2011 0531   BUN 47* 04/19/2011 0531   CREATININE 1.41* 04/19/2011 0531   CREATININE 2.65* 06/18/2010 1715   CALCIUM 9.0 04/19/2011 0531   PROT 6.4 04/18/2011 0500   ALBUMIN 1.8* 04/18/2011 0500   AST 8  04/18/2011 0500   ALT 8 04/18/2011 0500   ALKPHOS 59 04/18/2011 0500   BILITOT 0.2* 04/18/2011 0500   GFRNONAA 35* 04/19/2011 0531   GFRAA 40* 04/19/2011 0531     Intake/Output Summary (Last 24 hours) at 04/19/11 1503 Last data filed at 04/19/11 0500  Gross per 24 hour  Intake    240 ml  Output   1850 ml  Net  -1610 ml    Weight Status:  98.6 kg (1/4) -- no new weight  Nutrition Dx:  Inadequate Oral Intake, ongoing  Goal:  TPN to meet >90% of estimated protein needs, maximize energy provision as able during national lipid backorder, met  Intervention:    Wean TPN per pharmacy  Continue Resource Breeze supplement PO TID  RD to follow for nutrition care plan   Alger Memos Pager #:  [phone]

## 2011-04-19 NOTE — Progress Notes (Signed)
Wbc improving, afebrile, abdomen nontender, advance diet, wean tna Will need evaluation as outpt as to source of this problem

## 2011-04-20 ENCOUNTER — Other Ambulatory Visit (HOSPITAL_COMMUNITY): Payer: Self-pay

## 2011-04-20 LAB — DIFFERENTIAL
Eosinophils Absolute: 0.4 10*3/uL (ref 0.0–0.7)
Monocytes Absolute: 0.7 10*3/uL (ref 0.1–1.0)
Neutrophils Relative %: 82 % — ABNORMAL HIGH (ref 43–77)

## 2011-04-20 LAB — CBC
MCH: 26.7 pg (ref 26.0–34.0)
MCHC: 29.3 g/dL — ABNORMAL LOW (ref 30.0–36.0)
Platelets: 194 10*3/uL (ref 150–400)
RBC: 3.44 MIL/uL — ABNORMAL LOW (ref 3.87–5.11)

## 2011-04-20 LAB — COMPREHENSIVE METABOLIC PANEL
ALT: 9 U/L (ref 0–35)
AST: 12 U/L (ref 0–37)
Calcium: 9.1 mg/dL (ref 8.4–10.5)
GFR calc Af Amer: 41 mL/min — ABNORMAL LOW (ref >90)
Sodium: 140 mEq/L (ref 135–145)
Total Protein: 6.4 g/dL (ref 6.0–8.3)

## 2011-04-20 LAB — PHOSPHORUS: Phosphorus: 4 mg/dL (ref 2.3–4.6)

## 2011-04-20 LAB — PROTIME-INR
INR: 1.19 (ref 0.00–1.49)
Prothrombin Time: 15.4 seconds — ABNORMAL HIGH (ref 11.6–15.2)

## 2011-04-20 LAB — T4, FREE: Free T4: 1.07 ng/dL (ref 0.80–1.80)

## 2011-04-20 LAB — TSH: TSH: 7.011 u[IU]/mL — ABNORMAL HIGH (ref 0.350–4.500)

## 2011-04-21 ENCOUNTER — Other Ambulatory Visit (HOSPITAL_COMMUNITY): Payer: Self-pay

## 2011-04-21 LAB — RENAL FUNCTION PANEL
BUN: 44 mg/dL — ABNORMAL HIGH (ref 6–23)
Creatinine, Ser: 1.31 mg/dL — ABNORMAL HIGH (ref 0.50–1.10)
Glucose, Bld: 66 mg/dL — ABNORMAL LOW (ref 70–99)
Phosphorus: 4.5 mg/dL (ref 2.3–4.6)
Potassium: 4.8 mEq/L (ref 3.5–5.1)

## 2011-04-21 LAB — DIFFERENTIAL
Basophils Absolute: 0.1 10*3/uL (ref 0.0–0.1)
Eosinophils Relative: 2 % (ref 0–5)
Lymphs Abs: 1.6 10*3/uL (ref 0.7–4.0)
Monocytes Relative: 4 % (ref 3–12)
Neutro Abs: 11.2 10*3/uL — ABNORMAL HIGH (ref 1.7–7.7)

## 2011-04-21 LAB — CBC
HCT: 28.8 % — ABNORMAL LOW (ref 36.0–46.0)
MCV: 91.1 fL (ref 78.0–100.0)
RDW: 18.4 % — ABNORMAL HIGH (ref 11.5–15.5)
WBC: 13.7 10*3/uL — ABNORMAL HIGH (ref 4.0–10.5)

## 2011-04-21 LAB — EXPECTORATED SPUTUM ASSESSMENT W GRAM STAIN, RFLX TO RESP C

## 2011-04-23 LAB — RENAL FUNCTION PANEL
CO2: 30 mEq/L (ref 19–32)
GFR calc Af Amer: 45 mL/min — ABNORMAL LOW (ref >90)
Glucose, Bld: 182 mg/dL — ABNORMAL HIGH (ref 70–99)
Potassium: 5 mEq/L (ref 3.5–5.1)
Sodium: 134 mEq/L — ABNORMAL LOW (ref 135–145)

## 2011-04-23 LAB — URINE CULTURE
Colony Count: NO GROWTH
Culture  Setup Time: 201301121720
Culture: NO GROWTH

## 2011-04-23 LAB — DIFFERENTIAL
Basophils Relative: 1 % (ref 0–1)
Eosinophils Relative: 2 % (ref 0–5)
Lymphocytes Relative: 10 % — ABNORMAL LOW (ref 12–46)
Monocytes Relative: 5 % (ref 3–12)
Neutrophils Relative %: 82 % — ABNORMAL HIGH (ref 43–77)

## 2011-04-23 LAB — CBC
Hemoglobin: 8.7 g/dL — ABNORMAL LOW (ref 12.0–15.0)
RBC: 3.27 MIL/uL — ABNORMAL LOW (ref 3.87–5.11)

## 2011-04-24 ENCOUNTER — Other Ambulatory Visit (HOSPITAL_COMMUNITY): Payer: Self-pay

## 2011-04-24 LAB — BLOOD GAS, ARTERIAL
Acid-Base Excess: 5.4 mmol/L — ABNORMAL HIGH (ref 0.0–2.0)
O2 Content: 3 L/min
O2 Saturation: 98.2 %
TCO2: 33.4 mmol/L (ref 0–100)
pCO2 arterial: 64.8 mmHg (ref 35.0–45.0)
pO2, Arterial: 118 mmHg — ABNORMAL HIGH (ref 80.0–100.0)

## 2011-04-24 LAB — URINALYSIS, ROUTINE W REFLEX MICROSCOPIC
Glucose, UA: NEGATIVE mg/dL
Hgb urine dipstick: NEGATIVE
Ketones, ur: NEGATIVE mg/dL
Leukocytes, UA: NEGATIVE
Protein, ur: NEGATIVE mg/dL
Urobilinogen, UA: 0.2 mg/dL (ref 0.0–1.0)

## 2011-04-25 ENCOUNTER — Other Ambulatory Visit (HOSPITAL_COMMUNITY): Payer: Self-pay

## 2011-04-25 LAB — DIFFERENTIAL
Basophils Relative: 1 % (ref 0–1)
Eosinophils Relative: 2 % (ref 0–5)
Lymphs Abs: 1 10*3/uL (ref 0.7–4.0)
Monocytes Absolute: 0.5 10*3/uL (ref 0.1–1.0)
Monocytes Relative: 5 % (ref 3–12)
Neutro Abs: 8.5 10*3/uL — ABNORMAL HIGH (ref 1.7–7.7)

## 2011-04-25 LAB — CBC
HCT: 28.4 % — ABNORMAL LOW (ref 36.0–46.0)
MCH: 26.5 pg (ref 26.0–34.0)
MCV: 91.6 fL (ref 78.0–100.0)
RBC: 3.1 MIL/uL — ABNORMAL LOW (ref 3.87–5.11)
WBC: 10.3 10*3/uL (ref 4.0–10.5)

## 2011-04-27 LAB — RENAL FUNCTION PANEL
CO2: 33 mEq/L — ABNORMAL HIGH (ref 19–32)
Chloride: 99 mEq/L (ref 96–112)
Creatinine, Ser: 1.4 mg/dL — ABNORMAL HIGH (ref 0.50–1.10)
GFR calc non Af Amer: 35 mL/min — ABNORMAL LOW (ref >90)
Glucose, Bld: 131 mg/dL — ABNORMAL HIGH (ref 70–99)

## 2011-04-27 LAB — CBC
HCT: 28.3 % — ABNORMAL LOW (ref 36.0–46.0)
Hemoglobin: 8.1 g/dL — ABNORMAL LOW (ref 12.0–15.0)
MCH: 26.4 pg (ref 26.0–34.0)
MCV: 92.2 fL (ref 78.0–100.0)
Platelets: 230 10*3/uL (ref 150–400)
RBC: 3.07 MIL/uL — ABNORMAL LOW (ref 3.87–5.11)
WBC: 9.6 10*3/uL (ref 4.0–10.5)

## 2011-04-27 LAB — PROTIME-INR: INR: 1.15 (ref 0.00–1.49)

## 2011-04-27 LAB — IRON: Iron: 33 ug/dL — ABNORMAL LOW (ref 42–135)

## 2011-04-27 LAB — POTASSIUM: Potassium: 5.3 mEq/L — ABNORMAL HIGH (ref 3.5–5.1)

## 2011-04-28 ENCOUNTER — Other Ambulatory Visit (HOSPITAL_COMMUNITY): Payer: Self-pay

## 2011-04-28 DIAGNOSIS — E872 Acidosis: Secondary | ICD-10-CM

## 2011-04-28 DIAGNOSIS — J449 Chronic obstructive pulmonary disease, unspecified: Secondary | ICD-10-CM

## 2011-04-28 DIAGNOSIS — R0902 Hypoxemia: Secondary | ICD-10-CM

## 2011-04-28 DIAGNOSIS — J9600 Acute respiratory failure, unspecified whether with hypoxia or hypercapnia: Secondary | ICD-10-CM

## 2011-04-28 LAB — BLOOD GAS, ARTERIAL
Acid-Base Excess: 4.6 mmol/L — ABNORMAL HIGH (ref 0.0–2.0)
Acid-Base Excess: 4.3 mmol/L — ABNORMAL HIGH (ref 0.0–2.0)
Bicarbonate: 32.5 mEq/L — ABNORMAL HIGH (ref 20.0–24.0)
Inspiratory PAP: 16
O2 Saturation: 84.2 %
O2 Saturation: 98.8 %
Patient temperature: 98.6
TCO2: 35.8 mmol/L (ref 0–100)
TCO2: 35.4 mmol/L (ref 0–100)
pO2, Arterial: 226 mmHg — ABNORMAL HIGH (ref 80.0–100.0)

## 2011-04-28 LAB — DIFFERENTIAL
Basophils Absolute: 0.1 10*3/uL (ref 0.0–0.1)
Lymphs Abs: 1.2 10*3/uL (ref 0.7–4.0)
Monocytes Relative: 7 % (ref 3–12)

## 2011-04-28 LAB — CULTURE, RESPIRATORY W GRAM STAIN

## 2011-04-28 LAB — RENAL FUNCTION PANEL
Albumin: 2.3 g/dL — ABNORMAL LOW (ref 3.5–5.2)
BUN: 62 mg/dL — ABNORMAL HIGH (ref 6–23)
Creatinine, Ser: 1.48 mg/dL — ABNORMAL HIGH (ref 0.50–1.10)
GFR calc non Af Amer: 33 mL/min — ABNORMAL LOW (ref >90)
Phosphorus: 5.5 mg/dL — ABNORMAL HIGH (ref 2.3–4.6)
Potassium: 5.1 mEq/L (ref 3.5–5.1)

## 2011-04-28 LAB — IRON AND TIBC
Iron: 41 ug/dL — ABNORMAL LOW (ref 42–135)
Saturation Ratios: 17 % — ABNORMAL LOW (ref 20–55)
UIBC: 203 ug/dL (ref 125–400)

## 2011-04-28 LAB — CBC
MCV: 96.2 fL (ref 78.0–100.0)
Platelets: 239 10*3/uL (ref 150–400)
RDW: 18.4 % — ABNORMAL HIGH (ref 11.5–15.5)
WBC: 10.4 10*3/uL (ref 4.0–10.5)

## 2011-04-28 LAB — PRO B NATRIURETIC PEPTIDE: Pro B Natriuretic peptide (BNP): 1681 pg/mL — ABNORMAL HIGH (ref 0–450)

## 2011-04-28 LAB — PROTIME-INR: Prothrombin Time: 14.6 seconds (ref 11.6–15.2)

## 2011-04-28 NOTE — Procedures (Signed)
Intubation Procedure Note [name] [id number] Jul 23, 1930  Procedure: Intubation Indications: Respiratory insufficiency  Procedure Details Consent: Risks of procedure as well as the alternatives and risks of each were explained to the (patient/caregiver).  Consent for procedure obtained. Time Out: Verified patient identification, verified procedure, site/side was marked, verified correct patient position, special equipment/implants available, medications/allergies/relevent history reviewed, required imaging and test results available.  Performed  Maximum sterile technique was used including gloves, gown, hand hygiene and mask.  MAC and 3    Evaluation Hemodynamic Status: BP stable throughout; O2 sats: stable throughout Patient's Current Condition: stable Complications: No apparent complications Patient did tolerate procedure well. Chest X-ray ordered to verify placement.  CXR: pending.   [name] 04/28/2011

## 2011-04-28 NOTE — Consult Note (Signed)
Name: [name] MRN: [id number] DOB: [birth year]    LOS: 9  PCCM CONSULT NOTE  History of Present Illness: 78 yo female with hx COPD on home O2, diastolic CHF, CKD, OHS/OSA, prior ARDS requring trach initially admitted 12/22 to [facility] with abd pain and treated for diverticulitis.  Developed AMS and hypoxic resp failure requiring intubation on 12/27 and followed by PCCM.  Extubated 12/29 after treatment for HCAP, COPD and EColi UTI.  Transferred back to IM service and ultimately d/c to [facility] 1/[facility] for continued abx, pt/ot.  On 1/17 developed worsening SOB and hypercarbia despite bipap and PCCM consulted on [facility]. ABG unchanged despite several hours bipap and pt intubated 1/17.   Lines / Drains: ETT 1/17>>> R IJ CVL ([facility])>>>  Cultures: Urine 1/12>>>neg Sputum 1/10>>> neg Sputum 1/17>>>   Antibiotics: none  Tests / Events: 1/17>>>Worsening acute on chronic resp failure, intubated    Past Medical History  Diagnosis Date  . Diabetes mellitus   . Obesity   . COPD (chronic obstructive pulmonary disease)     Moderate. PFTs (12/10): FVC 67%, FEV1 58%, ratio 60%, TLC 95%, RV 138%, DLCO 43% (moderate obstructive defect). She is on home oxygen that should be worn at all times.  . Diastolic CHF, chronic     Echo (3/12): EF 55-60%, mild LV hypertrophy, grade I diastolic dysfunction, mild left atrial enlargement, normal pulmonary artery pressure  . CKD (chronic kidney disease)     Cr 2.4 when last checked. 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 PNA (pneumonia)     h/o with ARDS in 2005; MRSA PNA with prolonged hospitalization in [facility] 11/11  . Diverticulum     Jejunal  . OSA (obstructive sleep apnea)     possible. When pt was sedated for TEE in May 2010, she did develop some upper airway obstruction  . Anemia     of chronic disease  . Hypothyroidism   . Gout   . Atrial fibrillation 07/2008    Failed cardioversion with TEE guidance in May  2010. Pt went back to NSR by 11/2008 with amiodarne  . Hypertension   . GERD (gastroesophageal reflux disease)   . Peripheral vascular disease   . Pneumonia   . Chronic kidney disease    Past Surgical History  Procedure Date  . Tracheostomy     ARDS/ ICU admission, trach 2005  . Microperforation 08/2008    Of jejunem, ICU admission   Prior to Admission medications   Medication Sig Start Date End Date Taking? Authorizing Provider  albuterol (PROVENTIL) (2.5 MG/3ML) 0.083% nebulizer solution Take 3 mLs (2.5 mg total) by nebulization every 6 (six) hours as needed for wheezing. 02/14/11 02/14/12  Hannah Beat, MD  albuterol-ipratropium (COMBIVENT) 18-103 MCG/ACT inhaler Inhale 2 puffs into the lungs 2 (two) times daily. 04/19/11 04/18/12  Zannie Cove, MD  colchicine 0.6 MG tablet Take 1 tablet (0.6 mg total) by mouth daily. 04/19/11 04/18/12  Zannie Cove, MD  fat emulsion 20 % infusion Inject 240 mLs into the vein continuous. 04/19/11   Zannie Cove, MD  feeding supplement (RESOURCE BREEZE) LIQD Take 1 Container by mouth 3 (three) times daily with meals. 04/19/11   Zannie Cove, MD  fluticasone (FLOVENT HFA) 110 MCG/ACT inhaler Inhale 2 puffs into the lungs 2 (two) times daily. 04/19/11 04/18/12  Zannie Cove, MD  furosemide (LASIX) 40 MG tablet Take 0.5 tablets (20 mg total) by mouth daily. 04/19/11   Zannie Cove, MD  insulin  aspart (NOVOLOG) 100 UNIT/ML injection Inject 0-15 Units into the skin every 6 (six) hours. 04/19/11 04/18/12  Zannie Cove, MD  insulin glargine (LANTUS) 100 UNIT/ML injection Inject 20 Units into the skin at bedtime. 04/19/11 04/18/12  Zannie Cove, MD  metoprolol tartrate (LOPRESSOR) 25 MG tablet Take 0.5 tablets (12.5 mg total) by mouth 2 (two) times daily. 04/19/11   Zannie Cove, MD  metroNIDAZOLE (FLAGYL) 500 MG tablet Take 1 tablet (500 mg total) by mouth every 8 (eight) hours. 04/19/11 04/27/11  Zannie Cove, MD  morphine 2 MG/ML injection Inject 0.5 mLs (1 mg total)  into the vein every 4 (four) hours as needed. 04/19/11   Zannie Cove, MD  ondansetron (ZOFRAN) 4 MG/2ML SOLN Inject 2 mLs (4 mg total) into the vein every 6 (six) hours as needed. 04/19/11   Zannie Cove, MD  potassium chloride SA (K-DUR,KLOR-CON) 20 MEQ tablet Take 1 tablet (20 mEq total) by mouth daily. 04/19/11 04/18/12  Zannie Cove, MD  ranitidine (ZANTAC) 150 MG capsule Take 150 mg by mouth every evening.      Historical Provider, MD  simvastatin (ZOCOR) 20 MG tablet Take 1 tablet (20 mg total) by mouth at bedtime. 01/20/11 01/20/12  Hannah Beat, MD  tiotropium (SPIRIVA) 18 MCG inhalation capsule Place 1 capsule (18 mcg total) into inhaler and inhale daily. 02/14/11   Hannah Beat, MD   Allergies Allergies  Allergen Reactions  . Sulfonamide Derivatives     Childhood reaction    Family History Family History  Problem Relation Age of Onset  . Emphysema Mother     and asthma  . Asthma Mother   . Heart disease Neg Hx     premature    Social History  reports that she quit smoking about 43 years ago. She does not have any smokeless tobacco history on file. She reports that she does not drink alcohol or use illicit drugs.  Review Of Systems  11 points review of systems is negative with an exception of listed in HPI.  Vital Signs: Temp 99.0 RR 26 HR 74 BP 116/62 Sats 100% on 0.100FiO2  Physical Examination: General: obese, chronically ill appearing female Neuro: lethargic, arousable, MAE, gen weakness CV: s1s2 rrr, distant PULM: resps shallow, mildly labored, diminished throughout with exp wheeze GI: abd obese, distended, mildly tender diffusely, +bs Extremities: warm and dry, 1+ BLE edema   Ventilator settings:    Labs and Imaging:   CBC    Component Value Date/Time   WBC 10.4 04/28/2011 1241   RBC 2.88* 04/28/2011 1241   HGB 7.6* 04/28/2011 1241   HCT 27.7* 04/28/2011 1241   PLT 239 04/28/2011 1241   MCV 96.2 04/28/2011 1241   MCH 26.4 04/28/2011 1241   MCHC  27.4* 04/28/2011 1241   RDW 18.4* 04/28/2011 1241   LYMPHSABS PENDING 04/28/2011 1241   MONOABS PENDING 04/28/2011 1241   EOSABS PENDING 04/28/2011 1241   BASOSABS PENDING 04/28/2011 1241    BMET    Component Value Date/Time   NA 140 04/28/2011 0530   K 5.1 04/28/2011 0530   CL 102 04/28/2011 0530   CO2 32 04/28/2011 0530   GLUCOSE 142* 04/28/2011 0530   BUN 62* 04/28/2011 0530   CREATININE 1.48* 04/28/2011 0530   CREATININE 2.65* 06/18/2010 1715   CALCIUM 9.1 04/28/2011 0530   GFRNONAA 33* 04/28/2011 0530   GFRAA 38* 04/28/2011 0530    ABG    Component Value Date/Time   PHART 7.161* 04/28/2011 1250   PCO2ART 94.7*  04/28/2011 1250   PO2ART 226.0* 04/28/2011 1250   HCO3 32.5* 04/28/2011 1250   TCO2 35.4 04/28/2011 1250   ACIDBASEDEF 3.0* 04/08/2011 0431   O2SAT 98.8 04/28/2011 1250   Pro B Natriuretic peptide (BNP) =1681.0   Assessment and Plan: 1. Acute on chronic resp failure -- multifactorial in setting O2 dep COPD, OHS/OSA, diastolic dysfunction with recent HCAP and severe deconditioning.  Worsening hypercarbia despite bipap requiring intubation.  PLAN -  Intubate/vent support - 8cc/kg F/u cxr  F/u abg BD Rx AECOPD with steroids, abx -- change to Avelox  Diurese as Sydnee Levans, NP 04/28/2011  1:32 PM  *Care during the described time interval was provided by me and/or other providers on the critical care team. I have reviewed this patient's available data, including medical history, events of note, physical examination and test results as part of my evaluation.  CC time 35 minutes.  Patient seen and examined, agree with above note.  I dictated the care and orders written for this patient under my direction.  [name], M.D. [phone]

## 2011-04-29 ENCOUNTER — Other Ambulatory Visit (HOSPITAL_COMMUNITY): Payer: Self-pay

## 2011-04-29 LAB — TYPE AND SCREEN: Antibody Screen: NEGATIVE

## 2011-04-29 LAB — BLOOD GAS, ARTERIAL
Acid-Base Excess: 4.3 mmol/L — ABNORMAL HIGH (ref 0.0–2.0)
FIO2: 40 %
MECHVT: 500 mL
O2 Saturation: 97.5 %
RATE: 16 resp/min
TCO2: 30.6 mmol/L (ref 0–100)
pO2, Arterial: 92.6 mmHg (ref 80.0–100.0)

## 2011-04-29 LAB — DIFFERENTIAL
Lymphocytes Relative: 6 % — ABNORMAL LOW (ref 12–46)
Lymphs Abs: 0.6 10*3/uL — ABNORMAL LOW (ref 0.7–4.0)
Monocytes Relative: 0 % — ABNORMAL LOW (ref 3–12)
Neutro Abs: 9.3 10*3/uL — ABNORMAL HIGH (ref 1.7–7.7)
Neutrophils Relative %: 93 % — ABNORMAL HIGH (ref 43–77)

## 2011-04-29 LAB — BASIC METABOLIC PANEL
BUN: 74 mg/dL — ABNORMAL HIGH (ref 6–23)
Chloride: 101 mEq/L (ref 96–112)
Glucose, Bld: 268 mg/dL — ABNORMAL HIGH (ref 70–99)
Potassium: 5.6 mEq/L — ABNORMAL HIGH (ref 3.5–5.1)
Sodium: 137 mEq/L (ref 135–145)

## 2011-04-29 LAB — CBC
Hemoglobin: 7.4 g/dL — ABNORMAL LOW (ref 12.0–15.0)
RBC: 2.77 MIL/uL — ABNORMAL LOW (ref 3.87–5.11)
WBC: 9.9 10*3/uL (ref 4.0–10.5)

## 2011-04-29 LAB — POTASSIUM: Potassium: 4.6 mEq/L (ref 3.5–5.1)

## 2011-04-29 LAB — PREPARE RBC (CROSSMATCH)

## 2011-04-29 LAB — PROTIME-INR
INR: 1.25 (ref 0.00–1.49)
Prothrombin Time: 16 seconds — ABNORMAL HIGH (ref 11.6–15.2)

## 2011-04-29 LAB — MAGNESIUM: Magnesium: 2.4 mg/dL (ref 1.5–2.5)

## 2011-04-29 NOTE — Consult Note (Signed)
LOS: 10  PCCM PROGRESS  NOTE  History of Present Illness: 78 yo female with hx COPD on home O2, diastolic CHF, CKD, OHS/OSA, prior ARDS requring trach initially admitted 12/22 to [facility] with abd pain and treated for diverticulitis.  Developed AMS and hypoxic resp failure requiring intubation on 12/27 and followed by PCCM.  Extubated 12/29 after treatment for HCAP, COPD and EColi UTI.  Transferred back to IM service and ultimately d/c to [facility] 1/[facility] for continued abx, pt/ot.  On 1/17 developed worsening SOB and hypercarbia despite bipap and PCCM consulted on [facility]. ABG unchanged despite several hours bipap and pt intubated 1/17.   Lines / Drains: ETT 1/17>>> R IJ CVL ([facility])>>>  Cultures: Urine 1/12>>>neg Sputum 1/10>>> neg Sputum 1/17>>>   Antibiotics: none  Tests / Events: 1/17>>>Worsening acute on chronic resp failure, intubated   Vital Signs: reviewed  Physical Examination: General: obese, chronically ill appearing female Neuro: lethargic,but  arousable, MAE, gen weakness CV: s1s2 rrr, distant PULM: resps shallow, mildly labored, diminished throughout with exp wheeze GI: abd obese, distended, mildly tender diffusely, +bs Extremities: warm and dry, 1+ BLE edema   Ventilator settings:    Labs and Imaging:   CBC    Component Value Date/Time   WBC 9.9 04/29/2011 0448   RBC 2.77* 04/29/2011 0448   HGB 7.4* 04/29/2011 0448   HCT 25.7* 04/29/2011 0448   PLT 253 04/29/2011 0448   MCV 92.8 04/29/2011 0448   MCH 26.7 04/29/2011 0448   MCHC 28.8* 04/29/2011 0448   RDW 18.0* 04/29/2011 0448   LYMPHSABS 0.6* 04/29/2011 0448   MONOABS 0.0* 04/29/2011 0448   EOSABS 0.0 04/29/2011 0448   BASOSABS 0.0 04/29/2011 0448    BMET    Component Value Date/Time   NA 137 04/29/2011 0448   K 5.6* 04/29/2011 0448   CL 101 04/29/2011 0448   CO2 29 04/29/2011 0448   GLUCOSE 268* 04/29/2011 0448   BUN 74* 04/29/2011 0448   CREATININE 1.45* 04/29/2011 0448   CREATININE 2.65* 06/18/2010 1715   CALCIUM 8.7 04/29/2011 0448   GFRNONAA 34* 04/29/2011 0448   GFRAA 39* 04/29/2011 0448    ABG    Component Value Date/Time   PHART 7.383 04/29/2011 0455   PCO2ART 49.8* 04/29/2011 0455   PO2ART 92.6 04/29/2011 0455   HCO3 29.0* 04/29/2011 0455   TCO2 30.6 04/29/2011 0455   ACIDBASEDEF 3.0* 04/08/2011 0431   O2SAT 97.5 04/29/2011 0455   Pro B Natriuretic peptide (BNP) =1681.0  Dg Chest Portable 1 View  04/29/2011  *RADIOLOGY REPORT*  Clinical Data: On ventilator.  COPD.  Pneumonia.  PORTABLE CHEST - 1 VIEW  Comparison: 04/28/2011.  Findings: Right central line tip proximal superior vena cava level. Endotracheal tube tip 4.2 cm above the carina. Nasogastric tube courses below the diaphragm.  The tip is not included on this exam.  Consolidation left base may represent infiltrate, atelectasis and / or pleural effusion.  Underlying mass not excluded.  This can be evaluated follow-up.  Pulmonary vascular congestion/mild congestive heart failure.  No gross pneumothorax.  IMPRESSION: Persistent consolidation left base.  Pulmonary vascular congestion/mild congestive heart failure.  Cardiomegaly and tortuous aorta.  Original Report Authenticated By: Fuller Canada, M.D.   Dg Chest Portable 1 View  04/28/2011  *RADIOLOGY REPORT*  Clinical Data: 78 year old female with respiratory difficulty - evaluate endotracheal tube placement.  PORTABLE CHEST - 1 VIEW  Comparison: 0.17-1013  Findings: An endotracheal tube has been placed and appears in satisfactory position, 4  cm above the carina. An NG tube is now noted with tip off the field of view but entering the stomach. The right IJ central venous catheter is unchanged with tip overlying the mid SVC. Pulmonary vascular congestion is again noted as well as left lower lung consolidation/atelectasis and mild right basilar atelectasis/airspace disease. There is no evidence of pneumothorax.  IMPRESSION: Endotracheal tube placement which appears satisfactory - 4 cm above the  carina.  NG tube placement entering the stomach with tip off from view.  No significant change in left lower lung atelectasis/consolidation and mild right basilar atelectasis/airspace disease.  Original Report Authenticated By: Rosendo Gros, M.D.   Dg Chest Portable 1 View  04/28/2011  *RADIOLOGY REPORT*  Clinical Data: Shortness of breath  PORTABLE CHEST - 1 VIEW  Comparison: 04/24/2011  Findings: Cardiomegaly again noted.  Stable right IJ central line position.  Stable left pleural effusion and left basilar atelectasis or infiltrate.  Mild right basilar atelectasis or early infiltrate.  No convincing pulmonary edema.  IMPRESSION:  Stable right IJ central line position.  Stable left pleural effusion and left basilar atelectasis or infiltrate.  Mild right basilar atelectasis or early infiltrate.  No convincing pulmonary edema.  Original Report Authenticated By: Natasha Mead, M.D.   Dg Abd Portable 1v  04/28/2011  *RADIOLOGY REPORT*  Clinical Data: NG tube placement.  PORTABLE ABDOMEN - 1 VIEW  Comparison: 04/25/2011  Findings: An NG tube is identified within the stomach with tip overlying the distal stomach. The bowel gas pattern is unchanged - nonspecific. No other changes are identified.  IMPRESSION: NG tube with tip overlying the distal stomach.  Original Report Authenticated By: Rosendo Gros, M.D.   Assessment and Plan: 1. Acute on chronic resp failure -- multifactorial in setting O2 dep COPD, OHS/OSA, diastolic dysfunction with recent HCAP and severe deconditioning.  Worsening hypercarbia despite bipap requiring intubation.  PLAN -  Intubate/vent support - 8cc/kg 1/17 F/u cxr  F/u abg BD Rx AECOPD with steroids, abx -- change to Avelox  Diurese as Rolland Porter Minor ACNP Adolph Pollack PCCM Pager [phone] till 3 pm If no answer page [phone] 04/29/2011, 10:19 AM  Recommend rapid aggressive weaning (ICU style not LTAC style) as the respiratory failure was simply due to over oxygenation and is  rapidly improving.  Will place on PS today with F/U ABG, anticipate extubation tomorrow.  Agree with Abx and steroids.  Diureses is at an excellent rate.  If not extubatable quickly doubt will be able to without trach.  CC time 35 minutes.  Patient seen and examined, agree with above note.  I dictated the care and orders written for this patient under my direction.  [name], M.D. [phone]

## 2011-04-30 LAB — BASIC METABOLIC PANEL
BUN: 83 mg/dL — ABNORMAL HIGH (ref 6–23)
CO2: 28 mEq/L (ref 19–32)
Chloride: 95 mEq/L — ABNORMAL LOW (ref 96–112)
Glucose, Bld: 375 mg/dL — ABNORMAL HIGH (ref 70–99)
Potassium: 3.8 mEq/L (ref 3.5–5.1)
Sodium: 135 mEq/L (ref 135–145)

## 2011-04-30 LAB — CBC
HCT: 30.1 % — ABNORMAL LOW (ref 36.0–46.0)
Hemoglobin: 9.4 g/dL — ABNORMAL LOW (ref 12.0–15.0)
MCHC: 31.2 g/dL (ref 30.0–36.0)
RBC: 3.34 MIL/uL — ABNORMAL LOW (ref 3.87–5.11)

## 2011-04-30 LAB — PROTIME-INR
INR: 1.83 — ABNORMAL HIGH (ref 0.00–1.49)
Prothrombin Time: 21.5 seconds — ABNORMAL HIGH (ref 11.6–15.2)

## 2011-05-01 LAB — CBC
HCT: 31.6 % — ABNORMAL LOW (ref 36.0–46.0)
Hemoglobin: 9.8 g/dL — ABNORMAL LOW (ref 12.0–15.0)
MCHC: 31 g/dL (ref 30.0–36.0)
MCV: 89.3 fL (ref 78.0–100.0)
RDW: 17.2 % — ABNORMAL HIGH (ref 11.5–15.5)

## 2011-05-01 LAB — BASIC METABOLIC PANEL
BUN: 92 mg/dL — ABNORMAL HIGH (ref 6–23)
CO2: 27 mEq/L (ref 19–32)
Chloride: 97 mEq/L (ref 96–112)
Creatinine, Ser: 1.31 mg/dL — ABNORMAL HIGH (ref 0.50–1.10)
Glucose, Bld: 330 mg/dL — ABNORMAL HIGH (ref 70–99)
Potassium: 3.6 mEq/L (ref 3.5–5.1)

## 2011-05-01 LAB — PROTIME-INR: INR: 2.29 — ABNORMAL HIGH (ref 0.00–1.49)

## 2011-05-02 DIAGNOSIS — J9600 Acute respiratory failure, unspecified whether with hypoxia or hypercapnia: Secondary | ICD-10-CM

## 2011-05-02 DIAGNOSIS — E872 Acidosis: Secondary | ICD-10-CM

## 2011-05-02 DIAGNOSIS — Z9911 Dependence on respirator [ventilator] status: Secondary | ICD-10-CM

## 2011-05-02 DIAGNOSIS — J449 Chronic obstructive pulmonary disease, unspecified: Secondary | ICD-10-CM

## 2011-05-02 LAB — BASIC METABOLIC PANEL
BUN: 104 mg/dL — ABNORMAL HIGH (ref 6–23)
Chloride: 102 mEq/L (ref 96–112)
GFR calc Af Amer: 42 mL/min — ABNORMAL LOW (ref >90)
GFR calc non Af Amer: 36 mL/min — ABNORMAL LOW (ref >90)
Glucose, Bld: 147 mg/dL — ABNORMAL HIGH (ref 70–99)
Potassium: 5 mEq/L (ref 3.5–5.1)
Sodium: 139 mEq/L (ref 135–145)

## 2011-05-02 NOTE — Progress Notes (Signed)
  LOS: 13  PCCM PROGRESS  NOTE  History of Present Illness: 78 yo female with hx COPD on home O2, diastolic CHF, CKD, OHS/OSA, prior ARDS requring trach initially admitted 12/22 to [facility] with abd pain and treated for diverticulitis.  Developed AMS and hypoxic resp failure requiring intubation on 12/27 and followed by PCCM.  Extubated 12/29 after treatment for HCAP, COPD and EColi UTI.  Transferred back to IM service and ultimately d/c to [facility] 1/[facility] for continued abx, pt/ot.  On 1/17 developed worsening SOB and hypercarbia despite bipap and PCCM consulted on [facility]. ABG unchanged despite several hours bipap and pt intubated 1/17.   Lines / Drains: ETT 1/17>>> R IJ CVL ([facility])>>>  Cultures: Urine 1/12>>>neg Sputum 1/10>>> neg Sputum 1/17>>>   Antibiotics: Avelox 1/17>>>  Tests / Events: 1/17>>>Worsening acute on chronic resp failure, intubated   Vital Signs: reviewed  Physical Examination: General: obese, chronically ill appearing female Neuro: awake and alert, MAE, follows commands CV: s1s2 rrr, distant PULM: resps even non labored on PS 10/5, diminished bases, rare exp wheeze  GI: abd obese, distended, mildly tender diffusely, +bs Extremities: warm and dry, 1+ BLE edema   Ventilator settings:  PS 10/5   Labs and Imaging:   CBC    Component Value Date/Time   WBC 11.3* 05/01/2011 0625   RBC 3.54* 05/01/2011 0625   HGB 9.8* 05/01/2011 0625   HCT 31.6* 05/01/2011 0625   PLT 219 05/01/2011 0625   MCV 89.3 05/01/2011 0625   MCH 27.7 05/01/2011 0625   MCHC 31.0 05/01/2011 0625   RDW 17.2* 05/01/2011 0625   LYMPHSABS 0.6* 04/29/2011 0448   MONOABS 0.0* 04/29/2011 0448   EOSABS 0.0 04/29/2011 0448   BASOSABS 0.0 04/29/2011 0448    BMET    Component Value Date/Time   NA 139 05/02/2011 0500   K 5.0 05/02/2011 0500   CL 102 05/02/2011 0500   CO2 29 05/02/2011 0500   GLUCOSE 147* 05/02/2011 0500   BUN 104* 05/02/2011 0500   CREATININE 1.37* 05/02/2011 0500   CREATININE 2.65*  06/18/2010 1715   CALCIUM 8.0* 05/02/2011 0500   GFRNONAA 36* 05/02/2011 0500   GFRAA 42* 05/02/2011 0500    ABG    Component Value Date/Time   PHART 7.383 04/29/2011 0455   PCO2ART 49.8* 04/29/2011 0455   PO2ART 92.6 04/29/2011 0455   HCO3 29.0* 04/29/2011 0455   TCO2 30.6 04/29/2011 0455   ACIDBASEDEF 3.0* 04/08/2011 0431   O2SAT 97.5 04/29/2011 0455   Pro B Natriuretic peptide (BNP) =1681.0    Assessment and Plan: 1. Acute on chronic resp failure -- multifactorial in setting O2 dep COPD, OHS/OSA, diastolic dysfunction with recent HCAP and severe deconditioning.  Worsening hypercarbia despite bipap requiring intubation 1/17.  PLAN -  Trial PS 5/5 and likely extubate if tol  F/u cxr  F/u abg BD Cont Rx AECOPD with steroids, abx Diurese as Sydnee Levans, NP 05/02/2011  10:21 AM  *Care during the described time interval was provided by me and/or other providers on the critical care team. I have reviewed this patient's available data, including medical history, events of note, physical examination and test results as part of my evaluation.  Pt seen and examined and database reviewed. I agree with above findings, assessment and plan. Now extubated and looks good. Continue post extubation resp care with attention to airway hygiene  Billy Fischer, MD;  PCCM service; Mobile [phone]

## 2011-05-03 ENCOUNTER — Other Ambulatory Visit (HOSPITAL_COMMUNITY): Payer: Self-pay

## 2011-05-03 DIAGNOSIS — J449 Chronic obstructive pulmonary disease, unspecified: Secondary | ICD-10-CM

## 2011-05-03 DIAGNOSIS — J9600 Acute respiratory failure, unspecified whether with hypoxia or hypercapnia: Secondary | ICD-10-CM

## 2011-05-03 LAB — PROTIME-INR
INR: 2.62 — ABNORMAL HIGH (ref 0.00–1.49)
Prothrombin Time: 28.4 seconds — ABNORMAL HIGH (ref 11.6–15.2)

## 2011-05-03 NOTE — Progress Notes (Signed)
  LOS: 14  PCCM PROGRESS  NOTE  History of Present Illness: 78 yo female with hx COPD on home O2, diastolic CHF, CKD, OHS/OSA, prior ARDS requring trach initially admitted 12/22 to [facility] with abd pain and treated for diverticulitis.  Developed AMS and hypoxic resp failure requiring intubation on 12/27 and followed by PCCM.  Extubated 12/29 after treatment for HCAP, COPD and EColi UTI.  Transferred back to IM service and ultimately d/c to [facility] for continued abx, pt/ot.  On 1/17 developed worsening SOB and hypercarbia despite bipap and PCCM consulted on [facility]. ABG unchanged despite several hours bipap and pt intubated 1/17.   Lines / Drains: ETT 1/17>>> 1/21 R IJ CVL ([facility])>>>  Cultures: Urine 1/12>>>neg Sputum 1/10>>> neg Sputum 1/17>>>   Antibiotics: Avelox 1/17>>>  Tests / Events: 1/17>>>Worsening acute on chronic resp failure, intubated   Vital Signs: reviewed  SUBJ:  Tolerating extubation well. Strong cough. No complaints of dyspnea   Physical Examination: General: obese, chronically ill appearing female Neuro: awake and alert, MAE, follows commands CV: s1s2 rrr, distant PULM: resps even non labored on PS 10/5, diminished bases, rare exp wheeze  GI: abd obese, distended, mildly tender diffusely, +bs Extremities: warm and dry, 1+ BLE edema   Labs and Imaging:   None   Assessment and Plan: 1. Acute on chronic resp failure -- multifactorial in setting O2 dep COPD, OHS/OSA, diastolic dysfunction with recent HCAP and severe deconditioning.  Worsening hypercarbia despite bipap requiring intubation 1/17.  PLAN -  Post extubation care Cont BD Would taper steroids to off over 7-10d - suggest 60 qD X 3, 40 qD X3, 20 qD X3, 10 mg/d thereafter Diurese as tol   Discussed with Dr Christella Hartigan. Suggest removal of CVL, Foley, NGT if deemed appropriate  Billy Fischer, MD;  PCCM service; Mobile [phone]

## 2011-05-04 DIAGNOSIS — R0902 Hypoxemia: Secondary | ICD-10-CM

## 2011-05-04 DIAGNOSIS — J9600 Acute respiratory failure, unspecified whether with hypoxia or hypercapnia: Secondary | ICD-10-CM

## 2011-05-04 DIAGNOSIS — J449 Chronic obstructive pulmonary disease, unspecified: Secondary | ICD-10-CM

## 2011-05-04 LAB — PROTIME-INR: INR: 2.57 — ABNORMAL HIGH (ref 0.00–1.49)

## 2011-05-04 LAB — BASIC METABOLIC PANEL
CO2: 30 mEq/L (ref 19–32)
Calcium: 8.4 mg/dL (ref 8.4–10.5)
Creatinine, Ser: 1.62 mg/dL — ABNORMAL HIGH (ref 0.50–1.10)
GFR calc non Af Amer: 29 mL/min — ABNORMAL LOW (ref >90)
Glucose, Bld: 239 mg/dL — ABNORMAL HIGH (ref 70–99)
Sodium: 141 mEq/L (ref 135–145)

## 2011-05-04 NOTE — Progress Notes (Signed)
  LOS: 15  PCCM PROGRESS  NOTE  History of Present Illness: 78 yo female with hx COPD on home O2, diastolic CHF, CKD, OHS/OSA, prior ARDS requring trach initially admitted 12/22 to [facility] with abd pain and treated for diverticulitis.  Developed AMS and hypoxic resp failure requiring intubation on 12/27 and followed by PCCM.  Extubated 12/29 after treatment for HCAP, COPD and EColi UTI.  Transferred back to IM service and ultimately d/c to [facility] 1/8 for continued abx, pt/ot.  On 1/17 developed worsening SOB and hypercarbia despite bipap and PCCM consulted on [facility]. ABG unchanged despite several hours bipap and pt intubated 1/17.   Lines / Drains: ETT 1/17>>> 1/21 R IJ CVL ([facility])>>>  Cultures: Urine 1/12>>>neg Sputum 1/10>>> neg Sputum 1/17>>> psuedomonas ss cipro.  Antibiotics: Avelox 1/17>>>  Tests / Events: 1/17>>>Worsening acute on chronic resp failure, intubated  1/23 post extubation, sitting up eating  Vital Signs: Vital signs reviewed. Abnormal values will appear under impression plan section.    SUBJ:  NAD    Physical Examination: General: obese, obese WF sitting up eating Neuro: intact CV: s1s2 rrr, distant PULM: resps even non labored , decreased in bases   GI: abd obese, distended, mildly tender diffusely, +bs Extremities: warm and dry, 1+ BLE edema   Labs and Imaging:    Lab 05/04/11 0550 05/02/11 0500 05/01/11 0625  NA 141 139 135  K 4.9 5.0 3.6  CL 97 102 97  CO2 30 29 27   BUN 114* 104* 92*  CREATININE 1.62* 1.37* 1.31*  GLUCOSE 239* 147* 330*    Lab 05/01/11 0625 04/30/11 0939 04/29/11 0448  HGB 9.8* 9.4* 7.4*  HCT 31.6* 30.1* 25.7*  WBC 11.3* 12.4* 9.9  PLT 219 238 253   Dg Chest Port 1 View  05/03/2011  *RADIOLOGY REPORT*  Clinical Data: Shortness of breath.  Respiratory distress.  PORTABLE CHEST - 1 VIEW  Comparison: 04/29/2011  Findings: The patient is rotated to the left on today's exam, resulting in reduced diagnostic sensitivity and  specificity. Nasogastric tube extends into the stomach.  Right IJ line tip projects over the SVC.  Endotracheal tube is no longer well seen and has presumably been removed.  Continued basilar densities suggesting pleural effusions with left lower lobe and lingular atelectasis or pneumonia noted.  Aside from the absence of the endotracheal tube, the appearance is not significantly changed from 04/29/2011.  IMPRESSION:  1.  Interval extubation.  Otherwise stable radiographic appearance with suspected bilateral pleural effusions and considerable atelectasis of the left lower lobe and lingula.  Original Report Authenticated By: Dellia Cloud, M.D.      Assessment and Plan: 1. Acute on chronic resp failure -- multifactorial in setting O2 dep COPD, OHS/OSA, diastolic dysfunction with recent HCAP and severe deconditioning.  Worsening hypercarbia despite bipap requiring intubation 1/17.  With extubation 1/21  PLAN -  Post extubation care Cont BD Would taper steroids to off over 7-10d - suggest 60 qD X 3, 40 qD X3, 20 qD X3, 10 mg/d thereafter Diurese as tol   Suggest removal of CVL.   Brett Canales Minor ACNP [facility] PCCM Pager [phone] till 3 pm If no answer page [phone] 05/04/2011, 9:26 AM  ATTENDING MD ADDENDUM: Pt seen and examined and database reviewed. I agree with above findings, assessment and plan. She looks good on [place] O2. PCCM will sign off. Please call if we can be of further assistance  Billy Fischer, MD;  PCCM service; Mobile [phone]

## 2011-05-05 LAB — BASIC METABOLIC PANEL
BUN: 110 mg/dL — ABNORMAL HIGH (ref 6–23)
Calcium: 8.8 mg/dL (ref 8.4–10.5)
GFR calc Af Amer: 37 mL/min — ABNORMAL LOW (ref >90)
GFR calc non Af Amer: 32 mL/min — ABNORMAL LOW (ref >90)
Glucose, Bld: 68 mg/dL — ABNORMAL LOW (ref 70–99)
Potassium: 5 mEq/L (ref 3.5–5.1)

## 2011-05-06 ENCOUNTER — Other Ambulatory Visit (HOSPITAL_COMMUNITY): Payer: Self-pay

## 2011-05-06 LAB — BASIC METABOLIC PANEL
BUN: 98 mg/dL — ABNORMAL HIGH (ref 6–23)
CO2: 31 mEq/L (ref 19–32)
Calcium: 8.8 mg/dL (ref 8.4–10.5)
Chloride: 104 mEq/L (ref 96–112)
Creatinine, Ser: 1.67 mg/dL — ABNORMAL HIGH (ref 0.50–1.10)
GFR calc Af Amer: 33 mL/min — ABNORMAL LOW (ref >90)
GFR calc non Af Amer: 28 mL/min — ABNORMAL LOW (ref >90)
Glucose, Bld: 84 mg/dL (ref 70–99)
Potassium: 4.3 mEq/L (ref 3.5–5.1)
Sodium: 144 mEq/L (ref 135–145)

## 2011-05-07 LAB — CLOSTRIDIUM DIFFICILE BY PCR: Toxigenic C. Difficile by PCR: NEGATIVE

## 2011-05-08 LAB — PROTIME-INR
INR: 3 — ABNORMAL HIGH (ref 0.00–1.49)
Prothrombin Time: 31.6 seconds — ABNORMAL HIGH (ref 11.6–15.2)

## 2011-05-08 LAB — HEMOGLOBIN AND HEMATOCRIT, BLOOD
HCT: 35.2 % — ABNORMAL LOW (ref 36.0–46.0)
Hemoglobin: 10.6 g/dL — ABNORMAL LOW (ref 12.0–15.0)

## 2011-05-09 ENCOUNTER — Other Ambulatory Visit (HOSPITAL_COMMUNITY): Payer: Self-pay

## 2011-05-09 DIAGNOSIS — Z9911 Dependence on respirator [ventilator] status: Secondary | ICD-10-CM

## 2011-05-09 DIAGNOSIS — J9600 Acute respiratory failure, unspecified whether with hypoxia or hypercapnia: Secondary | ICD-10-CM

## 2011-05-09 DIAGNOSIS — J449 Chronic obstructive pulmonary disease, unspecified: Secondary | ICD-10-CM

## 2011-05-09 DIAGNOSIS — E872 Acidosis: Secondary | ICD-10-CM

## 2011-05-09 LAB — BASIC METABOLIC PANEL
BUN: 69 mg/dL — ABNORMAL HIGH (ref 6–23)
Chloride: 108 mEq/L (ref 96–112)
GFR calc Af Amer: 49 mL/min — ABNORMAL LOW (ref >90)
GFR calc non Af Amer: 43 mL/min — ABNORMAL LOW (ref >90)
Potassium: 4.2 mEq/L (ref 3.5–5.1)
Sodium: 146 mEq/L — ABNORMAL HIGH (ref 135–145)

## 2011-05-09 LAB — PROTIME-INR: Prothrombin Time: 28 seconds — ABNORMAL HIGH (ref 11.6–15.2)

## 2011-05-09 LAB — CBC
HCT: 34.3 % — ABNORMAL LOW (ref 36.0–46.0)
MCHC: 29.4 g/dL — ABNORMAL LOW (ref 30.0–36.0)
Platelets: 180 10*3/uL (ref 150–400)
RDW: 18 % — ABNORMAL HIGH (ref 11.5–15.5)
WBC: 16.2 10*3/uL — ABNORMAL HIGH (ref 4.0–10.5)

## 2011-05-09 NOTE — Progress Notes (Signed)
  LOS: 20  PCCM PROGRESS  NOTE  History of Present Illness: 78 yo female with hx COPD on home O2, diastolic CHF, CKD, OHS/OSA, prior ARDS requring trach initially admitted 12/22 to [facility] with abd pain and treated for diverticulitis.  Developed AMS and hypoxic resp failure requiring intubation on 12/27 and followed by PCCM.  Extubated 12/29 after treatment for HCAP, COPD and EColi UTI.  Transferred back to IM service and ultimately d/c to [facility] 1/8 for continued abx, pt/ot.  On 1/17 developed worsening SOB and hypercarbia despite bipap and PCCM consulted on [facility]. ABG unchanged despite several hours bipap and pt intubated 1/17.   Lines / Drains: ETT 1/17>>> 1/21 R IJ CVL ([facility])>>>  Cultures: Urine 1/12>>>neg Sputum 1/10>>> neg Sputum 1/17>>> psuedomonas ss cipro.  Antibiotics: Avelox 1/17>>>  Tests / Events: 1/17>>>Worsening acute on chronic resp failure, intubated  1/23 post extubation, sitting up eating  Vital Signs: Vital signs reviewed. Abnormal values will appear under impression plan section.    SUBJ:  NAD    Physical Examination: General: obese, obese WF sitting up eating Neuro: intact CV: s1s2 rrr, distant PULM: resps even non labored , decreased in bases   GI: abd obese, distended, mildly tender diffusely, +bs Extremities: warm and dry, 1+ BLE edema   Labs and Imaging:    Lab 05/09/11 0500 05/06/11 0552 05/05/11 0525  NA 146* 144 142  K 4.2 4.3 5.0  CL 108 104 99  CO2 30 31 31   BUN 69* 98* 110*  CREATININE 1.19* 1.67* 1.51*  GLUCOSE 37* 84 68*    Lab 05/09/11 0500 05/08/11 1618  HGB 10.1* 10.6*  HCT 34.3* 35.2*  WBC 16.2* --  PLT 180 --   Dg Chest Portable 1 View  05/09/2011  *RADIOLOGY REPORT*  Clinical Data: Exam  PORTABLE CHEST - 1 VIEW  Comparison: 05/06/2011; 05/03/2011; 04/29/2011  Findings: Interval increase in size of left sided pleural effusion, with associated increased atelectasis/collapse of the left lower lobe and lingula and shift  of the cardiomediastinal structures into the left hemithorax.  There is persistent blunting of the right costophrenic angle secondary to a hazy basilar opacity favored to represent a pleural effusion.  No definite pneumothorax.  Grossly unchanged bones.  IMPRESSION: 1.  Interval increase in left-sided pleural effusion and left lower lobe and lingular atelectasis/collapse. 2.  Grossly unchanged small right-sided pleural effusion and right basilar opacities favored to represent atelectasis.  Original Report Authenticated By: Waynard Reeds, M.D.      Assessment and Plan: 1. Acute on chronic resp failure -- multifactorial in setting O2 dep COPD, OHS/OSA, diastolic dysfunction with recent HCAP and severe deconditioning.  Worsening hypercarbia despite bipap requiring intubation 1/17.  With extubation 1/21.  PCCM reconsulted for LLL atelectasis.  Patient appears comfortable for now.  No need for bronch.  Mucomyst, vest and pul toilet with repeat CXR in AM.  If remains atlectatic will bronch in AM.  Alyson Reedy, M.D. [facility] Pulmonary/Critical Care Medicine. Pager: [phone]. After hours pager: [phone].

## 2011-05-10 LAB — PROTIME-INR
INR: 3.07 — ABNORMAL HIGH (ref 0.00–1.49)
Prothrombin Time: 32.2 seconds — ABNORMAL HIGH (ref 11.6–15.2)

## 2011-05-11 DIAGNOSIS — Z9911 Dependence on respirator [ventilator] status: Secondary | ICD-10-CM

## 2011-05-11 DIAGNOSIS — E872 Acidosis: Secondary | ICD-10-CM

## 2011-05-11 LAB — DIFFERENTIAL
Basophils Relative: 0 % (ref 0–1)
Eosinophils Absolute: 0.2 10*3/uL (ref 0.0–0.7)
Eosinophils Relative: 2 % (ref 0–5)
Lymphs Abs: 1 10*3/uL (ref 0.7–4.0)
Neutrophils Relative %: 86 % — ABNORMAL HIGH (ref 43–77)

## 2011-05-11 LAB — BASIC METABOLIC PANEL
Calcium: 8.5 mg/dL (ref 8.4–10.5)
GFR calc Af Amer: 50 mL/min — ABNORMAL LOW (ref >90)
GFR calc non Af Amer: 43 mL/min — ABNORMAL LOW (ref >90)
Glucose, Bld: 81 mg/dL (ref 70–99)
Potassium: 4.2 mEq/L (ref 3.5–5.1)
Sodium: 146 mEq/L — ABNORMAL HIGH (ref 135–145)

## 2011-05-11 LAB — CBC
MCH: 27.9 pg (ref 26.0–34.0)
MCHC: 29.8 g/dL — ABNORMAL LOW (ref 30.0–36.0)
MCV: 93.5 fL (ref 78.0–100.0)
Platelets: 171 10*3/uL (ref 150–400)
RDW: 18.1 % — ABNORMAL HIGH (ref 11.5–15.5)

## 2011-05-11 LAB — PROTIME-INR: Prothrombin Time: 28.9 seconds — ABNORMAL HIGH (ref 11.6–15.2)

## 2011-05-11 NOTE — Progress Notes (Signed)
  LOS: 22  PCCM PROGRESS  NOTE  History of Present Illness: 78 yo female with hx COPD on home O2, diastolic CHF, CKD, OHS/OSA, prior ARDS requring trach initially admitted 12/22 to [facility] with abd pain and treated for diverticulitis.  Developed AMS and hypoxic resp failure requiring intubation on 12/27 and followed by PCCM.  Extubated 12/29 after treatment for HCAP, COPD and EColi UTI.  Transferred back to IM service and ultimately d/c to [facility] 1/8 for continued abx, pt/ot.  On 1/17 developed worsening SOB and hypercarbia despite bipap and PCCM consulted on [facility]. ABG unchanged despite several hours bipap and pt intubated 1/17.   Lines / Drains: ETT 1/17>>> 1/21 R IJ CVL ([facility])>>>  Cultures: Urine 1/12>>>neg Sputum 1/10>>> neg Sputum 1/17>>> psuedomonas ss cipro.  Antibiotics: Avelox 1/17>>>  Tests / Events: 1/17>>>Worsening acute on chronic resp failure, intubated  1/23 post extubation, sitting up eating  Vital Signs: Vital signs reviewed. Abnormal values will appear under impression plan section.   SUBJ:  NAD   Physical Examination: General: obese, obese WF sitting up eating Neuro: intact CV: s1s2 rrr, distant PULM: resps even non labored , decreased in bases   GI: abd obese, distended, mildly tender diffusely, +bs Extremities: warm and dry, 1+ BLE edema   Labs and Imaging:    Lab 05/11/11 0635 05/09/11 0500 05/06/11 0552  NA 146* 146* 144  K 4.2 4.2 4.3  CL 107 108 104  CO2 26 30 31   BUN 52* 69* 98*  CREATININE 1.18* 1.19* 1.67*  GLUCOSE 81 37* 84    Lab 05/11/11 0635 05/09/11 0500 05/08/11 1618  HGB 9.5* 10.1* 10.6*  HCT 31.9* 34.3* 35.2*  WBC 13.5* 16.2* --  PLT 171 180 --   No results found.  Assessment and Plan: 1. Acute on chronic resp failure -- multifactorial in setting O2 dep COPD, OHS/OSA, diastolic dysfunction with recent HCAP and severe deconditioning.  Worsening hypercarbia despite bipap requiring intubation 1/17.  With extubation 1/21.   PCCM reconsulted for LLL atelectasis.  Patient appears comfortable for now and atelectasis seems to be resolving with pul hygienes, will f/u on Friday with a repeat CXR.  Alyson Reedy, M.D. [facility] Pulmonary/Critical Care Medicine. Pager: [phone]. After hours pager: [phone].

## 2011-05-12 DIAGNOSIS — E872 Acidosis: Secondary | ICD-10-CM

## 2011-05-12 DIAGNOSIS — J449 Chronic obstructive pulmonary disease, unspecified: Secondary | ICD-10-CM

## 2011-05-12 DIAGNOSIS — Z9911 Dependence on respirator [ventilator] status: Secondary | ICD-10-CM

## 2011-05-12 DIAGNOSIS — J9600 Acute respiratory failure, unspecified whether with hypoxia or hypercapnia: Secondary | ICD-10-CM

## 2011-05-12 LAB — PROTIME-INR
INR: 2.19 — ABNORMAL HIGH (ref 0.00–1.49)
Prothrombin Time: 24.7 seconds — ABNORMAL HIGH (ref 11.6–15.2)

## 2011-05-12 NOTE — Progress Notes (Signed)
  LOS: 23  PCCM PROGRESS  NOTE  History of Present Illness: 78 yo female with hx COPD on home O2, diastolic CHF, CKD, OHS/OSA, prior ARDS requring trach initially admitted 12/22 to [facility] with abd pain and treated for diverticulitis.  Developed AMS and hypoxic resp failure requiring intubation on 12/27 and followed by PCCM.  Extubated 12/29 after treatment for HCAP, COPD and EColi UTI.  Transferred back to IM service and ultimately d/c to [facility] 1[facility] for continued abx, pt/ot.  On 1/17 developed worsening SOB and hypercarbia despite bipap and PCCM consulted on [facility]. ABG unchanged despite several hours bipap and pt intubated 1/17.   Lines / Drains: ETT 1/17>>> 1/21 R IJ CVL ([facility])>>>  Cultures: Urine 1/12>>>neg Sputum 1/10>>> neg Sputum 1/17>>> psuedomonas ss cipro.  Antibiotics: Avelox 1/17>>>  Tests / Events: 1/17>>>Worsening acute on chronic resp failure, intubated  1/23 post extubation, sitting up eating  Vital Signs: Vital signs reviewed. Abnormal values will appear under impression plan section.   SUBJ:  NAD   Physical Examination: General: obese, obese WF sitting up eating Neuro: intact CV: s1s2 rrr, distant PULM: resps even non labored , decreased in bases   GI: abd obese, distended, mildly tender diffusely, +bs Extremities: warm and dry, 1+ BLE edema   Labs and Imaging:    Lab 05/11/11 0635 05/09/11 0500 05/06/11 0552  NA 146* 146* 144  K 4.2 4.2 4.3  CL 107 108 104  CO2 26 30 31   BUN 52* 69* 98*  CREATININE 1.18* 1.19* 1.67*  GLUCOSE 81 37* 84    Lab 05/11/11 0635 05/09/11 0500 05/08/11 1618  HGB 9.5* 10.1* 10.6*  HCT 31.9* 34.3* 35.2*  WBC 13.5* 16.2* --  PLT 171 180 --   No results found.  Assessment and Plan: 1. Acute on chronic resp failure -- multifactorial in setting O2 dep COPD, OHS/OSA, diastolic dysfunction with recent HCAP and severe deconditioning.  Stabilized and repeat CXR shows improvement in atelectasis.    PCCM signing off, please  call back if needed.  Alyson Reedy, M.D. [facility] Pulmonary/Critical Care Medicine. Pager: [phone]. After hours pager: [phone].

## 2011-05-13 NOTE — PMR Pre-admission (Signed)
[facility] PMR Admission Coordinator Pre-Admission Assessment  Patient Details Name: [name] Date of Birth: [birth year] Height:   5'3 Weight:   217 Patients Current Diet: Regular with thin liquids   Insurance Information: HMO    PPO:     PCP:     IPA:     80/20:yes     OTHER:no hmo PRIMARY:Medicare A and B      Policy#:[id number]      Subscriber:pt CM Name:      Phone#:     Fax#: Pre-Cert#:      Employer: Benefits:  Phone #:[employer]     Name:05/12/11 Eff. Date:A 01/09/98 and B 10/09/04     Deduct:$1184      Out of Pocket XWR:UEAV      Life WUJ:WJXB CIR:100%      SNF:20 full days LBD 04/19/11 Outpatient:80%     Co-Pay:20% Home Health:100%      Co-Pay:none DME:80%     Co-Pay:20% Providers:pt choice LBD 04/19/11 with pt having 43 full hospital days remaining and 30 co hospital remaining.I verified on 05/13/11 with Herminio Commons in insurance verification [phone] her hospital days remaining. On 05/17/11, pt will have 16 full hospital days remaining before she will be in her Inpatient hospital Coinsurance days of $296 per day for days 61-90. I have advised pt, daughter, and son to apply for medicaid assistance and that our average ELOS is 10 to 14 days.  SECONDARY:none      Policy#:      Subscriber: Daughter has applied for medicaid in the past and pt received. I have advised her to reapply Medicaid Application Date:      Case Manager: Disability Application Date:      Case Worker:  Current Medical History:   Patient Admitting Diagnosis: Deconditioning, pneumonia and respiratory failure  History of Present Illness: Patient admitted to [facility] on 04/02/11 with abdominal pain and treated for diverticulitis with small bowel obstruction treated with NG decompression and TNA. Also treated during that stay for pneumonia and sepsis and required mechanical intubation. Treated for C. Difficile colitis and gouty flare also.  On 04/19/11 patient was admitted to [facility] for  continued antibiotics and PT and OT therapy. ON 04/28/11 developed worsening SOB and hypercarbia despite bipap and PCCM consulted and pt intubated. Patient ultimately extubated on 05/04/11.  On 05/12/11, PCCM feels repeat CXR shows improvement in atelectasis and has stabilized. Acute on chronic respiratory failure multifactorial in setting O2 dependent COPD, OHS/OSA, diastolic dysfunction with recent HCAP and severe deconditioning. On 05/15/10, patient with severe pain to left wrist. Xray negative for fracture. Treated with steroids and patient's pain has decreased  And she is again using left wrist for adls. Patient able to ambulate with swedish walker with mod assist 10 feet on 05/17/11.   Prior Rehab/Hospitalizations: [facility] 04/19/11 until 05/19/11   Medications Current Medications: No current facility-administered medications for this encounter.  Past Medical History:  Diabetes Mellitus, Obesity, COPD, chronic diastolic CHF, Chronic Kidney Disease, PNA, Diverticulum, obstructive sleep apnea, chronic anemia, hypothyroidism, gout, atrial fibrillation, htn, GERD, Trach due to ARDS 2005  Family History: emphysema, asthma, heart disease  Precautions/Special Needs:   Fall Precautions  Home Living:   Lives With: Son (son and his wife in [place], spouse died 2022-10-28). Patient lived on one level, son and his wife lived on second level.  Prior Function:  Mod I RW. Used O2 at 2 to 3 liters pta.   Prior Activity Level: sedentary  Home Assistive  Devices/Equipment:   Home Assistive Devices/Equipment: Oxygen;Hospital bed;Walker (specify type)  Discharge Planning: Living Arrangements: Family members (To go live with daughter, son-in-law, granddaughter ) Support Systems: Family members Assistance Needed: supervision to min assist Do you have any problems obtaining your medications?: No Type of Residence: Private residence Home Care Services: Yes Type of Home Care Services:  (TBA) Patient expects to  be discharged to::  (daughter's home) Expected Discharge Date:  (ELOS 10 to 13 days) Case Management Consult Needed: Yes (Comment)                                                                           Prior Functional Levels:  Patient was Mod I with RW for short distances PTA. Sedentary. Did not drive. O2 at 2.5 liters nasal cannula.  Current Functional Levels: 05/12/11 on LTAC overall min to mod assist bed mobility, mod assist transfers and mod assist 5 feet with RW.  With OT, min assist upper body bathing and dressing and mod to max assist lower body bathing and dressing. Toileting  mod assist.  Able to sit in upright recliner at bedside for 2 to 2 1/2 hours at a time. On 05/17/11, patient able to ambulate with swedish walker with mod assist and 10 feet. Able to sit in recliner at bedside for 2 hrs without difficulty.  Previous Home Environment:  Living Arrangements: Family members (To go live with daughter, son-in-law, granddaughter ) Support Systems: Family members Assistance Needed: supervision to min assist Do you have any problems obtaining your medications?: No Type of Residence: Private residence Home Care Services: Yes Type of Home Care Services:  (TBA) Patient expects to be discharged to::  (daughter's home) Expected Discharge Date:  (ELOS 10 to 13 days) Home Environment Number of Levels:  (two level) Previous Home Environment Number of Steps: 2 -3 steps Previous Home Environment Is Bedroom on Main Floor?: Yes Previous Home Environment Is Bathroom on Main Floor?: Yes  Discharge Living Setting:  Plans for Discharge Living Setting: Lives with (comment) (daughter) Discharge Living Setting Number of Levels: two level Discharge Living Setting Number of Steps: 2 to 3 Discharge Living Setting is Bedroom on Main Floor?: Yes Discharge Living Setting is Bathroom on Main Floor?: Yes  Social/Family/Support Systems:   Patient Roles: Parent Contact Information:  Jacky Kindle, daughter and Deliah Strehlow, son Anticipated Caregiver: Nichola Sizer and her family Anticipated Caregiver's Contact Information: Peggye Form [phone], Shany Marinez [phone] Ability/Limitations of Caregiver: Daughter GNP works days, her husband retired and  (adult granddaughter available also) Caregiver Availability: 24/7 Discharge Plan Discussed with Primary Caregiver: Yes Is Caregiver In Agreement with Plan?: Yes Does Caregiver/Family have Issues with Lodging/Transportation while Pt is in Rehab?: No  Goals/Additional Needs:  Patient/Family Goal for Rehab: supervision with PT, supervision to min assist ADLS Special Service Needs: Patient to move to [place] temporarily with daughter, to return to [facility] with son when more independent Pt/Family Agrees to Admission and willing to participate: Yes Program Orientation Provided & Reviewed with Pt/Caregiver Including Roles  & Responsibilities: Yes  Preadmission Screen Completed By:  Clois Dupes, 05/19/2011 7:58 AM  Preadmission Screen Completed by Ottie Glazier, RN, Time/Date, 05/19/11 at 0800.    Discussed status with Kirsteins on  05/19/11 at 0800 and received telephone approval for admission today.  Admission Coordinator:  Clois Dupes, time 0800 Date 05/19/11.  Co-signed by:    Marland Kitchen

## 2011-05-14 LAB — CBC
HCT: 30.3 % — ABNORMAL LOW (ref 36.0–46.0)
Hemoglobin: 9.1 g/dL — ABNORMAL LOW (ref 12.0–15.0)
MCH: 27.7 pg (ref 26.0–34.0)
RBC: 3.28 MIL/uL — ABNORMAL LOW (ref 3.87–5.11)

## 2011-05-14 LAB — BASIC METABOLIC PANEL
BUN: 36 mg/dL — ABNORMAL HIGH (ref 6–23)
Chloride: 106 mEq/L (ref 96–112)
Creatinine, Ser: 1.08 mg/dL (ref 0.50–1.10)
Glucose, Bld: 105 mg/dL — ABNORMAL HIGH (ref 70–99)
Potassium: 3.6 mEq/L (ref 3.5–5.1)

## 2011-05-14 LAB — DIFFERENTIAL
Lymphs Abs: 1.4 10*3/uL (ref 0.7–4.0)
Monocytes Absolute: 0.4 10*3/uL (ref 0.1–1.0)
Monocytes Relative: 4 % (ref 3–12)
Neutro Abs: 8.5 10*3/uL — ABNORMAL HIGH (ref 1.7–7.7)
Neutrophils Relative %: 81 % — ABNORMAL HIGH (ref 43–77)

## 2011-05-14 LAB — PROTIME-INR
INR: 2.96 — ABNORMAL HIGH (ref 0.00–1.49)
Prothrombin Time: 31.3 seconds — ABNORMAL HIGH (ref 11.6–15.2)

## 2011-05-15 ENCOUNTER — Other Ambulatory Visit (HOSPITAL_COMMUNITY): Payer: Self-pay

## 2011-05-15 LAB — URINALYSIS, ROUTINE W REFLEX MICROSCOPIC
Glucose, UA: NEGATIVE mg/dL
Ketones, ur: NEGATIVE mg/dL
Protein, ur: 30 mg/dL — AB
Urobilinogen, UA: 0.2 mg/dL (ref 0.0–1.0)

## 2011-05-15 LAB — CBC
Hemoglobin: 9.2 g/dL — ABNORMAL LOW (ref 12.0–15.0)
MCH: 27.5 pg (ref 26.0–34.0)
MCHC: 30 g/dL (ref 30.0–36.0)
RDW: 17.6 % — ABNORMAL HIGH (ref 11.5–15.5)

## 2011-05-15 LAB — PROTIME-INR: INR: 2.99 — ABNORMAL HIGH (ref 0.00–1.49)

## 2011-05-15 LAB — URINE MICROSCOPIC-ADD ON

## 2011-05-16 ENCOUNTER — Other Ambulatory Visit (HOSPITAL_COMMUNITY): Payer: Self-pay

## 2011-05-17 ENCOUNTER — Telehealth: Payer: Self-pay | Admitting: Radiology

## 2011-05-17 LAB — PROTIME-INR: Prothrombin Time: 39.8 seconds — ABNORMAL HIGH (ref 11.6–15.2)

## 2011-05-17 NOTE — Telephone Encounter (Signed)
LMOM @  family home, patient is behind for her INR checks.

## 2011-05-18 LAB — CBC
Hemoglobin: 7.5 g/dL — ABNORMAL LOW (ref 12.0–15.0)
Platelets: 172 10*3/uL (ref 150–400)
RBC: 2.71 MIL/uL — ABNORMAL LOW (ref 3.87–5.11)
WBC: 18.8 10*3/uL — ABNORMAL HIGH (ref 4.0–10.5)

## 2011-05-18 LAB — URINE CULTURE
Colony Count: >=100000
Culture  Setup Time: 201302040315

## 2011-05-18 LAB — PREPARE RBC (CROSSMATCH)

## 2011-05-18 LAB — HEMOGLOBIN A1C
Hgb A1c MFr Bld: 6.3 % — ABNORMAL HIGH (ref <5.7)
Mean Plasma Glucose: 134 mg/dL — ABNORMAL HIGH (ref <117)

## 2011-05-18 LAB — PROTIME-INR: Prothrombin Time: 42 seconds — ABNORMAL HIGH (ref 11.6–15.2)

## 2011-05-19 ENCOUNTER — Inpatient Hospital Stay (HOSPITAL_COMMUNITY)
Admission: RE | Admit: 2011-05-19 | Discharge: 2011-06-01 | DRG: 945 | Disposition: A | Discharge status: Home Health | Payer: Medicare Other | Source: Ambulatory Visit | Attending: Physical Medicine & Rehabilitation | Admitting: Physical Medicine & Rehabilitation

## 2011-05-19 ENCOUNTER — Encounter (HOSPITAL_COMMUNITY): Payer: Self-pay | Admitting: *Deleted

## 2011-05-19 DIAGNOSIS — I739 Peripheral vascular disease, unspecified: Secondary | ICD-10-CM

## 2011-05-19 DIAGNOSIS — J4489 Other specified chronic obstructive pulmonary disease: Secondary | ICD-10-CM

## 2011-05-19 DIAGNOSIS — J9600 Acute respiratory failure, unspecified whether with hypoxia or hypercapnia: Secondary | ICD-10-CM

## 2011-05-19 DIAGNOSIS — IMO0002 Reserved for concepts with insufficient information to code with codable children: Secondary | ICD-10-CM

## 2011-05-19 DIAGNOSIS — J449 Chronic obstructive pulmonary disease, unspecified: Secondary | ICD-10-CM

## 2011-05-19 DIAGNOSIS — Z5189 Encounter for other specified aftercare: Principal | ICD-10-CM

## 2011-05-19 DIAGNOSIS — R5381 Other malaise: Secondary | ICD-10-CM

## 2011-05-19 DIAGNOSIS — N390 Urinary tract infection, site not specified: Secondary | ICD-10-CM

## 2011-05-19 DIAGNOSIS — D72829 Elevated white blood cell count, unspecified: Secondary | ICD-10-CM

## 2011-05-19 DIAGNOSIS — L988 Other specified disorders of the skin and subcutaneous tissue: Secondary | ICD-10-CM

## 2011-05-19 DIAGNOSIS — T380X5A Adverse effect of glucocorticoids and synthetic analogues, initial encounter: Secondary | ICD-10-CM

## 2011-05-19 DIAGNOSIS — K5732 Diverticulitis of large intestine without perforation or abscess without bleeding: Secondary | ICD-10-CM

## 2011-05-19 DIAGNOSIS — G7281 Critical illness myopathy: Secondary | ICD-10-CM

## 2011-05-19 DIAGNOSIS — G4733 Obstructive sleep apnea (adult) (pediatric): Secondary | ICD-10-CM

## 2011-05-19 DIAGNOSIS — Z79899 Other long term (current) drug therapy: Secondary | ICD-10-CM

## 2011-05-19 DIAGNOSIS — D638 Anemia in other chronic diseases classified elsewhere: Secondary | ICD-10-CM

## 2011-05-19 DIAGNOSIS — I5032 Chronic diastolic (congestive) heart failure: Secondary | ICD-10-CM

## 2011-05-19 DIAGNOSIS — E039 Hypothyroidism, unspecified: Secondary | ICD-10-CM

## 2011-05-19 DIAGNOSIS — N189 Chronic kidney disease, unspecified: Secondary | ICD-10-CM

## 2011-05-19 DIAGNOSIS — K219 Gastro-esophageal reflux disease without esophagitis: Secondary | ICD-10-CM

## 2011-05-19 DIAGNOSIS — Z794 Long term (current) use of insulin: Secondary | ICD-10-CM

## 2011-05-19 DIAGNOSIS — J189 Pneumonia, unspecified organism: Secondary | ICD-10-CM

## 2011-05-19 DIAGNOSIS — J441 Chronic obstructive pulmonary disease with (acute) exacerbation: Secondary | ICD-10-CM

## 2011-05-19 DIAGNOSIS — A0472 Enterocolitis due to Clostridium difficile, not specified as recurrent: Secondary | ICD-10-CM

## 2011-05-19 DIAGNOSIS — I509 Heart failure, unspecified: Secondary | ICD-10-CM

## 2011-05-19 DIAGNOSIS — B965 Pseudomonas (aeruginosa) (mallei) (pseudomallei) as the cause of diseases classified elsewhere: Secondary | ICD-10-CM

## 2011-05-19 DIAGNOSIS — E669 Obesity, unspecified: Secondary | ICD-10-CM

## 2011-05-19 DIAGNOSIS — Z7901 Long term (current) use of anticoagulants: Secondary | ICD-10-CM

## 2011-05-19 DIAGNOSIS — I129 Hypertensive chronic kidney disease with stage 1 through stage 4 chronic kidney disease, or unspecified chronic kidney disease: Secondary | ICD-10-CM

## 2011-05-19 DIAGNOSIS — R7309 Other abnormal glucose: Secondary | ICD-10-CM

## 2011-05-19 DIAGNOSIS — M109 Gout, unspecified: Secondary | ICD-10-CM

## 2011-05-19 DIAGNOSIS — I4891 Unspecified atrial fibrillation: Secondary | ICD-10-CM

## 2011-05-19 LAB — CBC
HCT: 25.3 % — ABNORMAL LOW (ref 36.0–46.0)
Hemoglobin: 7.7 g/dL — ABNORMAL LOW (ref 12.0–15.0)
MCH: 27.4 pg (ref 26.0–34.0)
MCHC: 30.4 g/dL (ref 30.0–36.0)
MCV: 90 fL (ref 78.0–100.0)
Platelets: 208 10*3/uL (ref 150–400)
RBC: 2.81 MIL/uL — ABNORMAL LOW (ref 3.87–5.11)
RDW: 17.4 % — ABNORMAL HIGH (ref 11.5–15.5)
WBC: 17.3 10*3/uL — ABNORMAL HIGH (ref 4.0–10.5)

## 2011-05-19 LAB — VANCOMYCIN, TROUGH: Vancomycin Tr: 27.1 ug/mL (ref 10.0–20.0)

## 2011-05-19 LAB — BASIC METABOLIC PANEL
CO2: 26 mEq/L (ref 19–32)
Calcium: 8.8 mg/dL (ref 8.4–10.5)
Chloride: 105 mEq/L (ref 96–112)
Glucose, Bld: 111 mg/dL — ABNORMAL HIGH (ref 70–99)
Potassium: 4.1 mEq/L (ref 3.5–5.1)
Sodium: 139 mEq/L (ref 135–145)

## 2011-05-19 LAB — PROTIME-INR
INR: 1.82 — ABNORMAL HIGH (ref 0.00–1.49)
Prothrombin Time: 21.4 seconds — ABNORMAL HIGH (ref 11.6–15.2)

## 2011-05-19 MED ORDER — FAMOTIDINE 20 MG PO TABS
20.0000 mg | ORAL_TABLET | Freq: Every day | ORAL | Status: DC
  Administered 2011-05-19 – 2011-05-31 (×13): 20 mg via ORAL
  Filled 2011-05-19 (×15): qty 1

## 2011-05-19 MED ORDER — CHLORHEXIDINE GLUCONATE 0.12 % MT SOLN
15.0000 mL | Freq: Two times a day (BID) | OROMUCOSAL | Status: DC
  Administered 2011-05-19 – 2011-06-01 (×21): 15 mL via OROMUCOSAL
  Filled 2011-05-19 (×28): qty 15

## 2011-05-19 MED ORDER — ALBUTEROL SULFATE (5 MG/ML) 0.5% IN NEBU
2.5000 mg | INHALATION_SOLUTION | Freq: Four times a day (QID) | RESPIRATORY_TRACT | Status: DC
  Administered 2011-05-19 – 2011-05-20 (×5): 2.5 mg via RESPIRATORY_TRACT
  Filled 2011-05-19 (×5): qty 0.5

## 2011-05-19 MED ORDER — TRAMADOL HCL 50 MG PO TABS
50.0000 mg | ORAL_TABLET | Freq: Every day | ORAL | Status: DC
  Administered 2011-05-19 – 2011-06-01 (×14): 50 mg via ORAL
  Filled 2011-05-19 (×13): qty 1

## 2011-05-19 MED ORDER — PREDNISONE 20 MG PO TABS
30.0000 mg | ORAL_TABLET | Freq: Every day | ORAL | Status: DC
  Administered 2011-05-20 – 2011-06-01 (×13): 30 mg via ORAL
  Filled 2011-05-19 (×14): qty 1

## 2011-05-19 MED ORDER — ONDANSETRON HCL 4 MG PO TABS
4.0000 mg | ORAL_TABLET | Freq: Three times a day (TID) | ORAL | Status: DC | PRN
  Administered 2011-05-27 – 2011-05-31 (×2): 4 mg via ORAL
  Filled 2011-05-19 (×2): qty 1

## 2011-05-19 MED ORDER — ALUM & MAG HYDROXIDE-SIMETH 200-200-20 MG/5ML PO SUSP: 30.0000 mL | ORAL | Status: DC | PRN

## 2011-05-19 MED ORDER — POLYETHYLENE GLYCOL 3350 17 G PO PACK
17.0000 g | PACK | Freq: Every day | ORAL | Status: DC | PRN
  Filled 2011-05-19: qty 1

## 2011-05-19 MED ORDER — METOPROLOL TARTRATE 12.5 MG HALF TABLET
12.5000 mg | ORAL_TABLET | Freq: Two times a day (BID) | ORAL | Status: DC
  Administered 2011-05-19 – 2011-06-01 (×26): 12.5 mg via ORAL
  Filled 2011-05-19 (×29): qty 1

## 2011-05-19 MED ORDER — BISACODYL 10 MG RE SUPP: 10.0000 mg | Freq: Every day | RECTAL | Status: DC | PRN

## 2011-05-19 MED ORDER — BUDESONIDE 0.5 MG/2ML IN SUSP
0.5000 mg | Freq: Two times a day (BID) | RESPIRATORY_TRACT | Status: DC
  Administered 2011-05-19 – 2011-06-01 (×21): 0.5 mg via RESPIRATORY_TRACT
  Filled 2011-05-19 (×32): qty 2

## 2011-05-19 MED ORDER — BIOTENE DRY MOUTH MT LIQD
15.0000 mL | Freq: Two times a day (BID) | OROMUCOSAL | Status: DC
  Administered 2011-05-19 – 2011-06-01 (×22): 15 mL via OROMUCOSAL

## 2011-05-19 MED ORDER — FLEET ENEMA 7-19 GM/118ML RE ENEM
1.0000 | ENEMA | Freq: Once | RECTAL | Status: AC | PRN
  Filled 2011-05-19: qty 1

## 2011-05-19 MED ORDER — ALUM & MAG HYDROXIDE-SIMETH 400-400-40 MG/5ML PO SUSP: 30.0000 mL | ORAL | Status: DC | PRN

## 2011-05-19 MED ORDER — DIPHENHYDRAMINE HCL 12.5 MG/5ML PO ELIX: 12.5000 mg | ORAL_SOLUTION | Freq: Four times a day (QID) | ORAL | Status: DC | PRN

## 2011-05-19 MED ORDER — WARFARIN SODIUM 2 MG PO TABS
2.0000 mg | ORAL_TABLET | Freq: Once | ORAL | Status: AC
  Administered 2011-05-19: 2 mg via ORAL
  Filled 2011-05-19: qty 1

## 2011-05-19 MED ORDER — INSULIN ASPART 100 UNIT/ML ~~LOC~~ SOLN
0.0000 [IU] | Freq: Three times a day (TID) | SUBCUTANEOUS | Status: DC
  Administered 2011-05-20 – 2011-05-21 (×2): 3 [IU] via SUBCUTANEOUS
  Administered 2011-05-22: 8 [IU] via SUBCUTANEOUS
  Administered 2011-05-23 (×2): 2 [IU] via SUBCUTANEOUS
  Administered 2011-05-24: 3 [IU] via SUBCUTANEOUS
  Administered 2011-05-24 – 2011-05-26 (×4): 2 [IU] via SUBCUTANEOUS
  Administered 2011-05-26: 3 [IU] via SUBCUTANEOUS
  Administered 2011-05-27 – 2011-05-28 (×2): 5 [IU] via SUBCUTANEOUS
  Administered 2011-05-29: 2 [IU] via SUBCUTANEOUS
  Administered 2011-05-29: 5 [IU] via SUBCUTANEOUS
  Administered 2011-05-31: 3 [IU] via SUBCUTANEOUS
  Filled 2011-05-19 (×2): qty 3

## 2011-05-19 MED ORDER — PRO-STAT SUGAR FREE PO LIQD
30.0000 mL | Freq: Three times a day (TID) | ORAL | Status: DC
  Administered 2011-05-19 – 2011-05-20 (×2): 30 mL via ORAL
  Filled 2011-05-19 (×5): qty 30

## 2011-05-19 MED ORDER — OXYCODONE HCL 5 MG PO TABS
5.0000 mg | ORAL_TABLET | ORAL | Status: DC | PRN
  Administered 2011-05-23: 5 mg via ORAL
  Administered 2011-05-23: 10 mg via ORAL
  Administered 2011-05-24: 5 mg via ORAL
  Administered 2011-05-31: 10 mg via ORAL
  Filled 2011-05-19: qty 1
  Filled 2011-05-19 (×2): qty 2
  Filled 2011-05-19: qty 1

## 2011-05-19 MED ORDER — IPRATROPIUM BROMIDE 0.02 % IN SOLN
0.5000 mg | Freq: Four times a day (QID) | RESPIRATORY_TRACT | Status: DC
  Administered 2011-05-19 – 2011-05-20 (×5): 0.5 mg via RESPIRATORY_TRACT
  Filled 2011-05-19 (×5): qty 2.5

## 2011-05-19 MED ORDER — ADULT MULTIVITAMIN W/MINERALS CH
1.0000 | ORAL_TABLET | Freq: Every day | ORAL | Status: DC
  Administered 2011-05-20 – 2011-06-01 (×13): 1 via ORAL
  Filled 2011-05-19 (×14): qty 1

## 2011-05-19 MED ORDER — HYDRALAZINE HCL 50 MG PO TABS
50.0000 mg | ORAL_TABLET | Freq: Three times a day (TID) | ORAL | Status: DC
  Administered 2011-05-19 – 2011-06-01 (×39): 50 mg via ORAL
  Filled 2011-05-19 (×43): qty 1

## 2011-05-19 MED ORDER — TRAZODONE HCL 50 MG PO TABS
25.0000 mg | ORAL_TABLET | Freq: Every evening | ORAL | Status: DC | PRN
Start: 2011-05-19 — End: 2011-06-01

## 2011-05-19 MED ORDER — INSULIN GLARGINE 100 UNIT/ML ~~LOC~~ SOLN
10.0000 [IU] | Freq: Every day | SUBCUTANEOUS | Status: DC
  Administered 2011-05-19 – 2011-05-24 (×6): 10 [IU] via SUBCUTANEOUS
  Filled 2011-05-19: qty 3

## 2011-05-19 MED ORDER — SIMVASTATIN 20 MG PO TABS
20.0000 mg | ORAL_TABLET | Freq: Every day | ORAL | Status: DC
  Administered 2011-05-19 – 2011-05-31 (×13): 20 mg via ORAL
  Filled 2011-05-19 (×14): qty 1

## 2011-05-19 MED ORDER — GUAIFENESIN 100 MG/5ML PO SOLN
5.0000 mL | Freq: Four times a day (QID) | ORAL | Status: DC
  Administered 2011-05-19 – 2011-06-01 (×47): 100 mg via ORAL
  Filled 2011-05-19 (×56): qty 5

## 2011-05-19 MED ORDER — PIPERACILLIN-TAZOBACTAM 3.375 G IVPB
3.3750 g | Freq: Three times a day (TID) | INTRAVENOUS | Status: DC
  Administered 2011-05-19 – 2011-05-20 (×2): 3.375 g via INTRAVENOUS
  Filled 2011-05-19 (×4): qty 50

## 2011-05-19 MED ORDER — METHOCARBAMOL 500 MG PO TABS: 500.0000 mg | ORAL_TABLET | Freq: Four times a day (QID) | ORAL | Status: DC | PRN

## 2011-05-19 MED ORDER — ACETAMINOPHEN 325 MG PO TABS
325.0000 mg | ORAL_TABLET | ORAL | Status: DC | PRN
  Filled 2011-05-19: qty 2

## 2011-05-19 NOTE — Progress Notes (Signed)
ANTIBIOTIC CONSULT NOTE - INITIAL  Pharmacy Consult for Vancomycin and Coumadin Indication: pneumonia and atrial fibrillation  Allergies  Allergen Reactions  . Sulfonamide Derivatives     Childhood reaction    Patient Measurements: Height: 5\' 3"  (160 cm) Weight: 217 lb 6 oz (98.601 kg) (from [facility] 04/19/11) IBW/kg (Calculated) : 52.4    Vital Signs:   Intake/Output from previous day:   Intake/Output from this shift:    Labs:  INR 1.82 (05/19/11 @ 13:59)  Basename 05/19/11 0555 05/18/11 0550  WBC 17.3* 18.8*  HGB 7.7* 7.5*  PLT 208 172  LABCREA -- --  CREATININE 1.48* --   Estimated Creatinine Clearance: 35.1 ml/min (by C-G formula based on Cr of 1.48).  Basename 05/19/11 0555  VANCOTROUGH 27.1*  VANCOPEAK --  Drue Dun --  GENTTROUGH --  GENTPEAK --  GENTRANDOM --  TOBRATROUGH --  Nolen Mu --  TOBRARND --  AMIKACINPEAK --  AMIKACINTROU --  AMIKACIN --     Microbiology: Recent Results (from the past 720 hour(s))  CULTURE, SPUTUM-ASSESSMENT     Status: Normal   Collection Time   04/21/11  5:56 PM      Component Value Range Status Comment   Specimen Description SPUTUM   Final    Special Requests NONE   Final    Sputum evaluation     Final    Value: MICROSCOPIC FINDINGS SUGGEST THAT THIS SPECIMEN IS NOT REPRESENTATIVE OF LOWER RESPIRATORY SECRETIONS. PLEASE RECOLLECT.     CALLED TO RN,E.OFARI AT 2057 BY L.PITT AT 2057 04/21/2011.   Report Status 04/21/2011 FINAL   Final   URINE CULTURE     Status: Normal   Collection Time   04/23/11  9:16 AM      Component Value Range Status Comment   Specimen Description URINE, CLEAN CATCH   Final    Special Requests NONE   Final    Culture  Setup Time [id number]   Final    Colony Count NO GROWTH   Final    Culture NO GROWTH   Final    Report Status 04/24/2011 FINAL   Final   CULTURE, RESPIRATORY     Status: Normal   Collection Time   04/28/11  4:40 PM      Component Value Range Status Comment   Specimen Description TRACHEAL ASPIRATE   Final    Special Requests NONE   Final    Gram Stain     Final    Value: ABUNDANT WBC PRESENT, PREDOMINANTLY PMN     NO SQUAMOUS EPITHELIAL CELLS SEEN     RARE GRAM POSITIVE COCCI     IN PAIRS   Culture FEW PSEUDOMONAS AERUGINOSA   Final    Report Status 05/01/2011 FINAL   Final    Organism ID, Bacteria PSEUDOMONAS AERUGINOSA   Final   CLOSTRIDIUM DIFFICILE BY PCR     Status: Normal   Collection Time   05/07/11  3:55 AM      Component Value Range Status Comment   C difficile by pcr NEGATIVE  NEGATIVE  Final   URINE CULTURE     Status: Normal   Collection Time   05/15/11  6:34 PM      Component Value Range Status Comment   Specimen Description URINE, CLEAN CATCH   Final    Special Requests NONE   Final    Culture  Setup Time [id number]   Final    Colony Count >=100,000 COLONIES/ML   Final  Culture PSEUDOMONAS AERUGINOSA   Final    Report Status 05/18/2011 FINAL   Final    Organism ID, Bacteria PSEUDOMONAS AERUGINOSA   Final   CULTURE, BLOOD (ROUTINE X 2)     Status: Normal (Preliminary result)   Collection Time   05/15/11  6:46 PM      Component Value Range Status Comment   Specimen Description BLOOD RIGHT ARM   Final    Special Requests BOTTLES DRAWN AEROBIC AND ANAEROBIC 10CC   Final    Culture  Setup Time [id number]   Final    Culture     Final    Value:        BLOOD CULTURE RECEIVED NO GROWTH TO DATE CULTURE WILL BE HELD FOR 5 DAYS BEFORE ISSUING A FINAL NEGATIVE REPORT   Report Status PENDING   Incomplete   CULTURE, BLOOD (ROUTINE X 2)     Status: Normal (Preliminary result)   Collection Time   05/15/11  7:00 PM      Component Value Range Status Comment   Specimen Description BLOOD RIGHT HAND   Final    Special Requests BOTTLES DRAWN AEROBIC ONLY 10CC   Final    Culture  Setup Time [id number]   Final    Culture     Final    Value:        BLOOD CULTURE RECEIVED NO GROWTH TO DATE CULTURE WILL BE HELD FOR 5 DAYS BEFORE ISSUING A  FINAL NEGATIVE REPORT   Report Status PENDING   Incomplete     Medical History: Past Medical History  Diagnosis Date  . Diabetes mellitus   . Obesity   . COPD (chronic obstructive pulmonary disease)     Moderate. PFTs (12/10): FVC 67%, FEV1 58%, ratio 60%, TLC 95%, RV 138%, DLCO 43% (moderate obstructive defect). She is on home oxygen that should be worn at all times.  . Diastolic CHF, chronic     Echo (3/12): EF 55-60%, mild LV hypertrophy, grade I diastolic dysfunction, mild left atrial enlargement, normal pulmonary artery pressure  . CKD (chronic kidney disease)     Cr 2.4 when last checked. 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 PNA (pneumonia)     h/o with ARDS in 2005; MRSA PNA with prolonged hospitalization in [facility] 11/11  . Diverticulum     Jejunal  . OSA (obstructive sleep apnea)     possible. When pt was sedated for TEE in May 2010, she did develop some upper airway obstruction  . Anemia     of chronic disease  . Hypothyroidism   . Gout   . Atrial fibrillation 07/2008    Failed cardioversion with TEE guidance in May 2010. Pt went back to [facility]R by 11/2008 with amiodarne  . Hypertension   . GERD (gastroesophageal reflux disease)   . Peripheral vascular disease   . Pneumonia   . Chronic kidney disease     Medications:  Prescriptions prior to admission  Medication Sig Dispense Refill  . albuterol (PROVENTIL) (2.5 MG/3ML) 0.083% nebulizer solution Take 2.5 mg by nebulization every 6 (six) hours as needed.      Marland Kitchen albuterol-ipratropium (COMBIVENT) 18-103 MCG/ACT inhaler Inhale 2 puffs into the lungs 2 (two) times daily.      . colchicine 0.6 MG tablet Take 0.6 mg by mouth daily.      . fat emulsion 20 % infusion Inject 240 mLs into the vein continuous.      . feeding supplement (  RESOURCE BREEZE) LIQD Take 240 mLs by mouth 3 (three) times daily with meals.      . fluticasone (FLOVENT HFA) 110 MCG/ACT inhaler Inhale 2 puffs into the  lungs 2 (two) times daily.      . furosemide (LASIX) 40 MG tablet Take 20 mg by mouth daily.      . insulin aspart (NOVOLOG) 100 UNIT/ML injection Inject 0-15 Units into the skin every 6 (six) hours.      . insulin glargine (LANTUS) 100 UNIT/ML injection Inject 20 Units into the skin at bedtime.      . metoprolol tartrate (LOPRESSOR) 25 MG tablet Take 12.5 mg by mouth 2 (two) times daily.      Marland Kitchen morphine 2 MG/ML injection Inject 1 mg into the vein every 4 (four) hours as needed.      . ondansetron (ZOFRAN) 4 MG/2ML SOLN Inject 4 mg into the vein every 6 (six) hours as needed.      . potassium chloride SA (K-DUR,KLOR-CON) 20 MEQ tablet Take 20 mEq by mouth daily.      . ranitidine (ZANTAC) 150 MG capsule Take 150 mg by mouth every evening.        . simvastatin (ZOCOR) 20 MG tablet Take 20 mg by mouth at bedtime.      Marland Kitchen tiotropium (SPIRIVA) 18 MCG inhalation capsule Place 18 mcg into inhaler and inhale daily.      Marland Kitchen DISCONTD: albuterol (PROVENTIL) (2.5 MG/3ML) 0.083% nebulizer solution Take 3 mLs (2.5 mg total) by nebulization every 6 (six) hours as needed for wheezing.  75 mL  12  . DISCONTD: albuterol-ipratropium (COMBIVENT) 18-103 MCG/ACT inhaler Inhale 2 puffs into the lungs 2 (two) times daily.      Marland Kitchen DISCONTD: colchicine 0.6 MG tablet Take 1 tablet (0.6 mg total) by mouth daily.  1 tablet    . DISCONTD: fat emulsion 20 % infusion Inject 240 mLs into the vein continuous.  250 mL  0  . DISCONTD: feeding supplement (RESOURCE BREEZE) LIQD Take 1 Container by mouth 3 (three) times daily with meals.      Marland Kitchen DISCONTD: fluticasone (FLOVENT HFA) 110 MCG/ACT inhaler Inhale 2 puffs into the lungs 2 (two) times daily.  1 Inhaler    . DISCONTD: furosemide (LASIX) 40 MG tablet Take 0.5 tablets (20 mg total) by mouth daily.  30 tablet  0  . DISCONTD: insulin aspart (NOVOLOG) 100 UNIT/ML injection Inject 0-15 Units into the skin every 6 (six) hours.  1 vial  0  . DISCONTD: insulin glargine (LANTUS) 100 UNIT/ML  injection Inject 20 Units into the skin at bedtime.  10 mL  0  . DISCONTD: metoprolol tartrate (LOPRESSOR) 25 MG tablet Take 0.5 tablets (12.5 mg total) by mouth 2 (two) times daily.      Marland Kitchen DISCONTD: morphine 2 MG/ML injection Inject 0.5 mLs (1 mg total) into the vein every 4 (four) hours as needed.  1 mL    . DISCONTD: ondansetron (ZOFRAN) 4 MG/2ML SOLN Inject 2 mLs (4 mg total) into the vein every 6 (six) hours as needed.  15 mL    . DISCONTD: potassium chloride SA (K-DUR,KLOR-CON) 20 MEQ tablet Take 1 tablet (20 mEq total) by mouth daily.      Marland Kitchen DISCONTD: simvastatin (ZOCOR) 20 MG tablet Take 1 tablet (20 mg total) by mouth at bedtime.  90 tablet  3  . DISCONTD: tiotropium (SPIRIVA) 18 MCG inhalation capsule Place 1 capsule (18 mcg total) into inhaler and inhale daily.  30 capsule  11   Scheduled:    . albuterol  2.5 mg Nebulization Q6H  . antiseptic oral rinse  15 mL Mouth Rinse q12n4p  . budesonide  0.5 mg Nebulization BID  . chlorhexidine  15 mL Mouth Rinse BID  . famotidine  20 mg Oral QHS  . feeding supplement  30 mL Oral TID WC  . guaiFENesin  5 mL Oral QID  . hydrALAZINE  50 mg Oral Q8H  . insulin aspart  0-15 Units Subcutaneous TID WC  . insulin glargine  10 Units Subcutaneous QHS  . ipratropium  0.5 mg Nebulization Q6H  . metoprolol tartrate  12.5 mg Oral BID  . mulitivitamin with minerals  1 tablet Oral Daily  . piperacillin-tazobactam (ZOSYN)  IV  3.375 g Intravenous Q8H  . predniSONE  30 mg Oral Q breakfast  . simvastatin  20 mg Oral q1800  . traMADol  50 mg Oral Daily   Infusions:   PRN: acetaminophen, alum & mag hydroxide-simeth, bisacodyl, diphenhydrAMINE, methocarbamol, ondansetron, oxyCODONE, polyethylene glycol, sodium phosphate, traZODone, DISCONTD: alum & mag hydroxide-simeth  Assessment: 78 yo female admitted to rehab unit from [facility]. Previously hospitalized at [facility] 04/02/11  Thru 04/19/11 when discharged to [facility]. Patient with history of atrial  fibrillation on chronic coumadin. At [facility] coumadin held for short time due to epistaxis which resolved and coumadin resumed. She continued on coumadin per pharmacy protocol at [facility].  Last Coumadin dose was 2mg  po on 2/3, INR 3.48 on 2/4 thus dose was held 2/4 and 2/5. On 2/6 the INR was high 4.32 thus Coumadin held.  Vitamin K 10 mg SQ and Vitamin K 5mg  po given on 2/6. Today INR = 1.82. Coumadin is to be resumed for A. Fib, goal INR 2-3.   Vancomycin Day #3 She is presently completing a 10 day course of empiric Zosyn and Vancomycin for coverage of her pulmonary status (PNA) as well as Pseudomonas urinary tract infection. MD order indicates continue vancomycin thru 2/12  (? 8day course).  Vancomycin started 2/4 pm , thus 10 day course should end after doses on 2/14. Zosyn started 2/3 PM. Vancomycin dosage is 1gm IV q24hr. Today 2/7 Vanc trough reported = 27.1 mcg/ml @ 0555.  RN reports no Vancomycin given at [facility] today per their admit report.     Goal of Therapy:  Vancomycin trough 15-20 mcg/ml INR  = 2-3  Plan:  1. Hold Vancomycin dose. Check random vancomycin level in AM.   2. Coumadin 2mg  po tonight.  3. INR qAM.  Arman Filter 05/19/2011,7:19 PM

## 2011-05-19 NOTE — Plan of Care (Signed)
Overall Plan of Care [facility]) Patient Details Name: [name] MRN: [id number] DOB: [birth year]  Diagnosis:  Rehabilitation for weakness following critical illness Primary Diagnosis:    critical illness myopathy  Co-morbidities: Severe COPD, chronic renal failure,  Functional Problem List  Patient demonstrates impairments in the following areas: Balance, Bladder, Bowel, Endurance, Motor, Pain and Safety  Basic ADL's: grooming, bathing, dressing and toileting Advanced ADL's: simple meal preparation  Transfers:  bed mobility, bed to chair, toilet, tub/shower, car and furniture Locomotion:  ambulation, wheelchair mobility and stairs  Additional Impairments:  Functional use of upper extremity  Anticipated Outcomes Item Anticipated Outcome  Eating/Swallowing  Mod. independent  Basic self-care  Minimal assistance  Tolieting  Minimal assistance  Bowel/Bladder  Minimal assistance  Transfers  supervision  Locomotion  supervision  Communication    Cognition    Pain  Pain <2   Safety/Judgment  supervision  Other     Therapy Plan: PT Frequency: 1-2 X/day, 60-90 minutes OT Frequency: 1-2 X/day, 60-90 minutes     Team Interventions: Item RN PT OT SLP SW TR Other  Self Care/Advanced ADL Retraining   x      Neuromuscular Re-Education   x      Therapeutic Activities  x x      UE/LE Strength Training/ROM  x x      UE/LE Coordination Activities  x x      Visual/Perceptual Remediation/Compensation   x      DME/Adaptive Equipment Instruction  x x      Therapeutic Exercise  x x      Balance/Vestibular Training  x x      Patient/Family Education  x x      Cognitive Remediation/Compensation   x      Functional Mobility Training  x x      Ambulation/Gait Training  x       Museum/gallery curator  x       Wheelchair Propulsion/Positioning  x       Paediatric nurse         Bladder Management x        Bowel Management x        Disease Management/Prevention x        Pain Management x x x      Medication Management x        Skin Care/Wound Management x        Splinting/Orthotics         Discharge Planning   x  x    Psychosocial Support     x                       Team Discharge Planning: Destination:  Home Projected Follow-up:  PT, OT and Home Health Projected Equipment Needs:  Cane, Elevated Engineer, civil (consulting), Scientist, physiological Patient/family involved in discharge planning:  Yes  MD ELOS: 3 wks Medical Rehab Prognosis:  Good Assessment: 78 yo female with O2 dependant COPD admitted initially with diverticulitis developed acute resp failure vent dependant now weaned and requiring CIR level PT,OT, 24/7 rehab RN and MD.

## 2011-05-19 NOTE — Progress Notes (Addendum)
Admitted from [facility]. Patient is aox3. Profusely bleeding to right hand upon arrival to the room. Pressure applied for 5 minutes. Per rn from [facility], it was the site where the peripheral iv was pulled out. Abdomen distended. Skin tear to right lower leg, skin tear to right hand, stage 2 to sacrum measuring 2cmx1cm, redness to surrounding area. Excoriation to lower buttock area. Skin to lower extremities dry and flaky with some discoloration Skin care protocol initiated. Verbalized understanding on safety. Night rn to folllow -up on insulin administration. Awaiting tray and insulin pen from pharmacy.

## 2011-05-19 NOTE — H&P (Signed)
Physical Medicine and Rehabilitation Admission H&P  [name] is an 78 y.o. female.   No chief complaint on file. : HPI: 78 year old right-handed obese female with history of atrial fibrillation on chronic Coumadin therapy, chronic obstructive pulmonary disease with home oxygen and prior ARDS requiring tracheostomy in 2005., congestive heart failure ,chronic renal insufficiency with baseline 2.5. Admitted December 22 with abdominal discomfort mainly to the right lower quadrant associated nausea. She denied any diarrhea or fever. CT abdomen revealed acute diffuse diverticulitis. Seen by general surgery initially placed on TPN for nutritional support and later changed to nasogastric tube feeds. Her diet was slowly advanced and nasogastric tube removed. Patient developed altered mental status and hypoxic respiratory failure requiring intubation on December 27 and followed by critical care medicine. She was extubated December 29 after treatment for hospital acquired pneumonia. Due to her overall deconditioning and respiratory status she was discharged to [facility] on January 8 for ongoing medical management. Her hospital course at [facility] complicated by ongoing bouts of recurrent respiratory failure and repeat intubation. Her chronic Coumadin had been held for a short time due to epistaxes which resolved and her Coumadin had since been resumed with cardiac rate control for atrial fibrillation. She did develop Clostridium difficile treated with Flagyl which has since been completed and remains on contact cautions. Bouts of anemia she was transfused one unit packed red blood cells on 2/7 for hemoglobin of 7.7 . Her white blood cell count remained elevated 18,000 felt to be induced secondary to steroids which she remained on her chronic obstructive pulmonary disease. Patient also with bouts of hyperglycemia felt to be again related to her steroids and placed on low-dose insulin therapy. She is presently  completing a 10 day course empirically of Zosyn and vancomycin for  coverage of her pulmonary status as well as Pseudomonas urinary tract infection that is end February 10. Right lower extremity hematoma/skin tear with Xeroform Vaseline gauze dressing daily that has been healing nicely.  Review of Systems  Constitutional: Positive for malaise/fatigue.  Eyes: Negative for double vision.  Respiratory: Positive for shortness of breath. Negative for cough.  Cardiovascular: Negative for chest pain.  Gastrointestinal: Negative for nausea and blood in stool.  Genitourinary: Negative for dysuria.  Musculoskeletal: Negative for myalgias.  Skin: Negative.  Neurological: Negative for dizziness and headaches.  Psychiatric/Behavioral: Negative  Past Medical History  Diagnosis Date  . Diabetes mellitus   . Obesity   . COPD (chronic obstructive pulmonary disease)     Moderate. PFTs (12/10): FVC 67%, FEV1 58%, ratio 60%, TLC 95%, RV 138%, DLCO 43% (moderate obstructive defect). She is on home oxygen that should be worn at all times.  . Diastolic CHF, chronic     Echo (3/12): EF 55-60%, mild LV hypertrophy, grade I diastolic dysfunction, mild left atrial enlargement, normal pulmonary artery pressure  . CKD (chronic kidney disease)     Cr 2.4 when last checked. 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 PNA (pneumonia)     h/o with ARDS in 2005; MRSA PNA with prolonged hospitalization in [facility] 11/11  . Diverticulum     Jejunal  . OSA (obstructive sleep apnea)     possible. When pt was sedated for TEE in May 2010, she did develop some upper airway obstruction  . Anemia     of chronic disease  . Hypothyroidism   . Gout   . Atrial fibrillation 07/2008    Failed cardioversion with TEE guidance in  May 2010. Pt went back to NSR by 11/2008 with amiodarne  . Hypertension   . GERD (gastroesophageal reflux disease)   . Peripheral vascular disease   . Pneumonia   .  Chronic kidney disease    Past Surgical History  Procedure Date  . Tracheostomy     ARDS/ ICU admission, trach 2005  . Microperforation 08/2008    Of jejunem, ICU admission   Family History  Problem Relation Age of Onset  . Emphysema Mother     and asthma  . Asthma Mother   . Heart disease Neg Hx     premature   Social History:  reports that she quit smoking about 43 years ago. She does not have any smokeless tobacco history on file. She reports that she does not drink alcohol or use illicit drugs. Allergies:  Allergies  Allergen Reactions  . Sulfonamide Derivatives     Childhood reaction   No current facility-administered medications on file as of 05/19/2011.   Medications Prior to Admission  Medication Sig Dispense Refill  . albuterol (PROVENTIL) (2.5 MG/3ML) 0.083% nebulizer solution Take 2.5 mg by nebulization every 6 (six) hours as needed.      Marland Kitchen albuterol-ipratropium (COMBIVENT) 18-103 MCG/ACT inhaler Inhale 2 puffs into the lungs 2 (two) times daily.      . colchicine 0.6 MG tablet Take 0.6 mg by mouth daily.      . fat emulsion 20 % infusion Inject 240 mLs into the vein continuous.      . feeding supplement (RESOURCE BREEZE) LIQD Take 240 mLs by mouth 3 (three) times daily with meals.      . fluticasone (FLOVENT HFA) 110 MCG/ACT inhaler Inhale 2 puffs into the lungs 2 (two) times daily.      . furosemide (LASIX) 40 MG tablet Take 20 mg by mouth daily.      . insulin aspart (NOVOLOG) 100 UNIT/ML injection Inject 0-15 Units into the skin every 6 (six) hours.      . insulin glargine (LANTUS) 100 UNIT/ML injection Inject 20 Units into the skin at bedtime.      . metoprolol tartrate (LOPRESSOR) 25 MG tablet Take 12.5 mg by mouth 2 (two) times daily.      Marland Kitchen morphine 2 MG/ML injection Inject 1 mg into the vein every 4 (four) hours as needed.      . ondansetron (ZOFRAN) 4 MG/2ML SOLN Inject 4 mg into the vein every 6 (six) hours as needed.      . potassium chloride SA  (K-DUR,KLOR-CON) 20 MEQ tablet Take 20 mEq by mouth daily.      . ranitidine (ZANTAC) 150 MG capsule Take 150 mg by mouth every evening.        . simvastatin (ZOCOR) 20 MG tablet Take 20 mg by mouth at bedtime.      Marland Kitchen tiotropium (SPIRIVA) 18 MCG inhalation capsule Place 18 mcg into inhaler and inhale daily.      Marland Kitchen DISCONTD: morphine 2 MG/ML injection Inject 0.5 mLs (1 mg total) into the vein every 4 (four) hours as needed.  1 mL      Home:     Functional History:    Functional Status:  Mobility:          ADL:    Cognition:       There were no vitals taken for this visit. Blood pressure 142/69, pulse 81, temperature 98.1 F (36.7 C), temperature source Oral, resp. rate 18, height 5\' 3"  (1.6 m), weight  99.565 kg (219 lb 8 oz), SpO2 99.00%.  Physical Exam  Constitutional: She is oriented to person, place, and time. She appears well-developed.  HENT:  Head: Normocephalic.  Neck: Neck supple. No thyromegaly present.  Cardiovascular: Normal rate.  Pulmonary/Chest: Effort normal. She has no wheezes.  Abdominal: She exhibits no distension. There is no tenderness.  Musculoskeletal: She exhibits no edema.  Neurological: She is alert and oriented to person, place, and time.  Skin: Skin is warm and dry. Skin tear right lower extremity hematoma with Vaseline gauze in place and currently with no drainage  Psychiatric: She has a normal mood and affect.  motor strength is 3-/5 in bilateral deltoids,and 4-/5 biceps, triceps, and grip  3 minus/5 in bilateral hip flexion,4-/5 knee extension and ankle dorsiflexion plantar flexion  Sensation is intact to light touch in bilateral lower extremities   Results for orders placed during the hospital encounter of 04/19/11 (from the past 48 hour(s))  CBC     Status: Abnormal   Collection Time   05/18/11  5:50 AM      Component Value Range Comment   WBC 18.8 (*) 4.0 - 10.5 (K/uL)    RBC 2.71 (*) 3.87 - 5.11 (MIL/uL)    Hemoglobin 7.5 (*) 12.0  - 15.0 (g/dL)    HCT 66.0 (*) 63.0 - 46.0 (%)    MCV 90.8  78.0 - 100.0 (fL)    MCH 27.7  26.0 - 34.0 (pg)    MCHC 30.5  30.0 - 36.0 (g/dL)    RDW 16.0 (*) 10.9 - 15.5 (%)    Platelets 172  150 - 400 (K/uL)   PROTIME-INR     Status: Abnormal   Collection Time   05/18/11  5:50 AM      Component Value Range Comment   Prothrombin Time 42.0 (*) 11.6 - 15.2 (seconds)    INR 4.32 (*) 0.00 - 1.49    HEMOGLOBIN A1C     Status: Abnormal   Collection Time   05/18/11  3:32 PM      Component Value Range Comment   Hemoglobin A1C 6.3 (*) <5.7 (%)    Mean Plasma Glucose 134 (*) <117 (mg/dL)   TYPE AND SCREEN     Status: Normal (Preliminary result)   Collection Time   05/18/11  8:06 PM      Component Value Range Comment   ABO/RH(D) O POS      Antibody Screen NEG      Sample Expiration 05/21/2011      Unit Number [id number]      Blood Component Type RED CELLS,LR      Unit division 00      Status of Unit ISSUED      Transfusion Status OK TO TRANSFUSE      Crossmatch Result Compatible     PREPARE RBC (CROSSMATCH)     Status: Normal   Collection Time   05/18/11  8:06 PM      Component Value Range Comment   Order Confirmation ORDER PROCESSED BY BLOOD BANK     VANCOMYCIN, TROUGH     Status: Abnormal   Collection Time   05/19/11  5:55 AM      Component Value Range Comment   Vancomycin Tr 27.1 (*) 10.0 - 20.0 (ug/mL)   CBC     Status: Abnormal   Collection Time   05/19/11  5:55 AM      Component Value Range Comment   WBC 17.3 (*) 4.0 - 10.5 (K/uL)  RBC 2.81 (*) 3.87 - 5.11 (MIL/uL)    Hemoglobin 7.7 (*) 12.0 - 15.0 (g/dL)    HCT 16.1 (*) 09.6 - 46.0 (%)    MCV 90.0  78.0 - 100.0 (fL)    MCH 27.4  26.0 - 34.0 (pg)    MCHC 30.4  30.0 - 36.0 (g/dL)    RDW 04.5 (*) 40.9 - 15.5 (%)    Platelets 208  150 - 400 (K/uL)   BASIC METABOLIC PANEL     Status: Abnormal   Collection Time   05/19/11  5:55 AM      Component Value Range Comment   Sodium 139  135 - 145 (mEq/L)    Potassium 4.1  3.5 - 5.1 (mEq/L)     Chloride 105  96 - 112 (mEq/L)    CO2 26  19 - 32 (mEq/L)    Glucose, Bld 111 (*) 70 - 99 (mg/dL)    BUN 59 (*) 6 - 23 (mg/dL)    Creatinine, Ser 8.11 (*) 0.50 - 1.10 (mg/dL)    Calcium 8.8  8.4 - 10.5 (mg/dL)    GFR calc non Af Amer 33 (*) >90 (mL/min)    GFR calc Af Amer 38 (*) >90 (mL/min)   PROTIME-INR     Status: Abnormal   Collection Time   05/19/11  1:59 PM      Component Value Range Comment   Prothrombin Time 21.4 (*) 11.6 - 15.2 (seconds)    INR 1.82 (*) 0.00 - 1.49     No results found.  Post Admission Physician Evaluation: 1. Functional deficits secondary  to critical illness myopathy causing proximal weakness in the upper and lower extremities. 2. Patient is admitted to receive collaborative, interdisciplinary care between the physiatrist, rehab nursing staff, and therapy team. 3. Patient's level of medical complexity and substantial therapy needs in context of that medical necessity cannot be provided at a lesser intensity of care such as a SNF. 4. Patient has experienced substantial functional loss from his/her baseline which was documented above under the "Functional History" and "Functional Status" headings.  Judging by the patient's diagnosis, physical exam, and functional history, the patient has potential for functional progress which will result in measurable gains while on inpatient rehab.  These gains will be of substantial and practical use upon discharge  in facilitating mobility and self-care at the household level. 5. Physiatrist will provide 24 hour management of medical needs as well as oversight of the therapy plan/treatment and provide guidance as appropriate regarding the interaction of the two. 6. 24 hour rehab nursing will assist with bowel management, safety, skin/wound care, disease management, medication administration and pain management  and help integrate therapy concepts, techniques,education, etc. 7. PT will assess and treat for:  Pre-gait and gait  training, equipment, endurance, safety.  Goals are: Supervision to min assist mobility. 8. OT will assess and treat for: ADLs, cognitive/perceptual skills, neuromuscular reeducation, safety, endurance, equipment.   Goals are: Supervision for upper extremity ADLs and min assist lower extremity ADLs. 9. SLP will assess and treat for: Cognition.  Goals are: Adequate cognitive status for home environment. 10. Case Management and Social Worker will assess and treat for psychological issues and discharge planning. 11. Team conference will be held weekly to assess progress toward goals and to determine barriers to discharge. 12.  Patient will receive at least 3 hours of therapy per day at least 5 days per week. 13. ELOS and Prognosis: 3 weeks good   Medical Problem  List and Plan: 1. deconditioning secondary to diverticulitis/chronic obstructive pulmonary disease, respiratory failure 2. DVT Prophylaxis/Anticoagulation: Chronic Coumadin therapy for atrial fibrillation. INR of 4.3 on 05/17/2010. Will consult pharmacy in regards to following Coumadin therapy 3. COPD. Chronic oxygen therapy. Continue nebulizer treatments as well as chronic steroid therapy. Follow pulmonary services as needed. Check oxygen saturations every shift. 4. Chronic renal insufficiency. Baseline creatinine 2.5. Strict I/O. Followup labs. Latest GFR was 33 mL per minute                                                                                                                                                                                                                                                                                           5. Congestive heart failure. Monitor for any signs of fluid overload 6. Hyperglycemia. Felt to be related to steroids. No documentation of diabetes mellitus. Continue low-dose insulin therapy. Check blood sugars a.c. and at bedtime 7. Hypertension/atrial fibrillation. Metoprolol 12.5 mg twice  daily. Monitor the increased mobility 8. Right lower extremity hematoma/skin tear. Vaseline gauze dressing daily. Will ask wound care nurse for followup. 9. Chronic anemia. Transfused one unit 05/18/2010. Followup CBC  10. Clostridium difficile. Flagyl completed. Contact precautions Erick Colace 05/19/2011, 4:36 PM

## 2011-05-19 NOTE — Telephone Encounter (Signed)
No return call 

## 2011-05-20 DIAGNOSIS — R5381 Other malaise: Secondary | ICD-10-CM

## 2011-05-20 DIAGNOSIS — G7281 Critical illness myopathy: Secondary | ICD-10-CM

## 2011-05-20 DIAGNOSIS — J441 Chronic obstructive pulmonary disease with (acute) exacerbation: Secondary | ICD-10-CM

## 2011-05-20 DIAGNOSIS — Z5189 Encounter for other specified aftercare: Secondary | ICD-10-CM

## 2011-05-20 LAB — COMPREHENSIVE METABOLIC PANEL
ALT: 10 U/L (ref 0–35)
AST: 8 U/L (ref 0–37)
Albumin: 2.2 g/dL — ABNORMAL LOW (ref 3.5–5.2)
Alkaline Phosphatase: 58 U/L (ref 39–117)
BUN: 70 mg/dL — ABNORMAL HIGH (ref 6–23)
Chloride: 105 mEq/L (ref 96–112)
Potassium: 4.5 mEq/L (ref 3.5–5.1)
Sodium: 142 mEq/L (ref 135–145)
Total Protein: 6.1 g/dL (ref 6.0–8.3)

## 2011-05-20 LAB — PROTIME-INR
INR: 1.3 (ref 0.00–1.49)
Prothrombin Time: 16.4 seconds — ABNORMAL HIGH (ref 11.6–15.2)

## 2011-05-20 LAB — GLUCOSE, CAPILLARY
Glucose-Capillary: 79 mg/dL (ref 70–99)
Glucose-Capillary: 106 mg/dL — ABNORMAL HIGH (ref 70–99)
Glucose-Capillary: 173 mg/dL — ABNORMAL HIGH (ref 70–99)

## 2011-05-20 LAB — DIFFERENTIAL
Basophils Absolute: 0 10*3/uL (ref 0.0–0.1)
Lymphs Abs: 1.7 10*3/uL (ref 0.7–4.0)
Monocytes Absolute: 0.6 10*3/uL (ref 0.1–1.0)
Monocytes Relative: 5 % (ref 3–12)
Neutro Abs: 10.5 10*3/uL — ABNORMAL HIGH (ref 1.7–7.7)

## 2011-05-20 LAB — CBC
HCT: 27.2 % — ABNORMAL LOW (ref 36.0–46.0)
MCH: 27.2 pg (ref 26.0–34.0)
MCV: 90.1 fL (ref 78.0–100.0)
RDW: 18.1 % — ABNORMAL HIGH (ref 11.5–15.5)
WBC: 12.8 10*3/uL — ABNORMAL HIGH (ref 4.0–10.5)

## 2011-05-20 LAB — TYPE AND SCREEN
ABO/RH(D): O POS
Antibody Screen: NEGATIVE

## 2011-05-20 MED ORDER — ALBUTEROL SULFATE (5 MG/ML) 0.5% IN NEBU: 2.5000 mg | INHALATION_SOLUTION | RESPIRATORY_TRACT | Status: DC | PRN

## 2011-05-20 MED ORDER — ALBUTEROL SULFATE (5 MG/ML) 0.5% IN NEBU: 2.5000 mg | INHALATION_SOLUTION | Freq: Four times a day (QID) | RESPIRATORY_TRACT | Status: DC

## 2011-05-20 MED ORDER — PRO-STAT SUGAR FREE PO LIQD
30.0000 mL | Freq: Two times a day (BID) | ORAL | Status: DC
  Administered 2011-05-20 – 2011-06-01 (×19): 30 mL via ORAL
  Filled 2011-05-20 (×27): qty 30

## 2011-05-20 MED ORDER — WARFARIN SODIUM 2 MG PO TABS
2.0000 mg | ORAL_TABLET | Freq: Once | ORAL | Status: AC
  Administered 2011-05-20: 2 mg via ORAL
  Filled 2011-05-20: qty 1

## 2011-05-20 MED ORDER — SODIUM CHLORIDE 0.9 % IV SOLN
250.0000 mg | Freq: Four times a day (QID) | INTRAVENOUS | Status: DC
  Administered 2011-05-20 – 2011-05-24 (×17): 250 mg via INTRAVENOUS
  Filled 2011-05-20 (×30): qty 250

## 2011-05-20 MED ORDER — IPRATROPIUM BROMIDE 0.02 % IN SOLN: 0.5000 mg | Freq: Four times a day (QID) | RESPIRATORY_TRACT | Status: DC

## 2011-05-20 NOTE — Progress Notes (Signed)
Occupational Therapy Session Note  Patient Details  Name: [name] MRN: [id number] Date of Birth: 07-05-1930  Today's Date: 05/20/2011 Time: 1115-1200 Time Calculation (min): 45 min  Precautions: Precautions Precautions: Fall Required Braces or Orthoses: No Restrictions Weight Bearing Restrictions: No  Short Term Goals: OT Short Term Goal 1: Pt would perform bed mobility to get to EOB with mod A in order to perform ADLs OT Short Term Goal 2: Pt will sit EOB/ EOM without UE support in static sitting for 3 minutes with min A to increased balance during ADL OT Short Term Goal 3: Pt will don shirt in supported sitting with min A OT Short Term Goal 4: Pt will perform all grooming tasks at sink with supervision  Skilled Therapeutic Interventions/Progress Updates:  Ambulation/gait training;DME/adaptive equipment instruction;Functional mobility training;Self Care/advanced ADL retraining;Therapeutic Activities;UE/LE Coordination activities;Therapeutic Exercise;UE/LE Strength taining/ROM;Patient/family education;Neuromuscular re-education;Community reintegration;Balance/vestibular training   1:1 Focus on trunk/ core posture at midline in sitting and standing with sara plus with mirror feedback. Pt with heavy lean/ LOB to left with difficulty maintaining midline. In standing with severe decreased hip extension with buttocks sticking out, tactile cues for hip extension, knee extension and upright posture. Able to stand for 10 sec at a time X3.  Decreased activity tolerance and O2 drop below 85% required increased amount of rest to recover to 90% on O2. Sitting activity focusing on upright posture in midline with pursed lip breathing  With feet on floor with anterior weight shift.   Pain No c/o  Therapy/Group: Individual Therapy  Melonie Florida 05/20/2011, 12:11 PM

## 2011-05-20 NOTE — Progress Notes (Addendum)
Patient ID: [name], female   DOB: 1931-03-26, 78 y.o.   MRN: [id number] Subjective/Complaints: Review of Systems  Respiratory: Positive for cough and shortness of breath.   All other systems reviewed and are negative.  slept fairly well. Anxious to start rehab 2/8   Objective: Vital Signs: Blood pressure 112/66, pulse 87, temperature 98.7 F (37.1 C), temperature source Oral, resp. rate 17, height 5\' 3"  (1.6 m), weight 98.601 kg (217 lb 6 oz), SpO2 90.00%. No results found.  Basename 05/19/11 0555 05/18/11 0550  WBC 17.3* 18.8*  HGB 7.7* 7.5*  HCT 25.3* 24.6*  PLT 208 172    Basename 05/19/11 0555  NA 139  K 4.1  CL 105  CO2 26  GLUCOSE 111*  BUN 59*  CREATININE 1.48*  CALCIUM 8.8   CBG (last 3)   Basename 05/19/11 2051 05/19/11 1655  GLUCAP 209* 210*    Wt Readings from Last 3 Encounters:  05/19/11 98.601 kg (217 lb 6 oz)  04/15/11 98.612 kg (217 lb 6.4 oz)  01/04/11 94.856 kg (209 lb 1.9 oz)    Physical Exam:  General appearance: alert, cooperative, no distress and morbidly obese Head: Normocephalic, without obvious abnormality, atraumatic Eyes: conjunctivae/corneas clear. PERRL, EOM's intact. Fundi benign. Ears: normal TM's and external ear canals both ears Nose: Nares normal. Septum midline. Mucosa normal. No drainage or sinus tenderness. Throat: lips, mucosa, and tongue normal; teeth and gums normal Neck: no adenopathy, no carotid bruit, no JVD, supple, symmetrical, trachea midline and thyroid not enlarged, symmetric, no tenderness/mass/nodules Back: symmetric, no curvature. ROM normal. No CVA tenderness. Resp: rhonchi bilaterally Cardio: regular rate and rhythm, S1, S2 normal, no murmur, click, rub or gallop GI: soft, non-tender; bowel sounds normal; no masses,  no organomegaly Extremities: edema 1+ bilateral lower ext's trace in ue's Pulses: 2+ and symmetric Skin: Skin color, texture, turgor normal. No rashes or lesions Neurologic: moves all  4's with prox greater than distal weakness (3- to 4- out of 5) no sensory isues Incision/Wound: lac on right forearm and right leg with dressing and mild sanginous d/c.    Assessment/Plan: 1. Functional deficits secondary to profound deconditioning after respiratory failure, diverticulitis which require 3+ hours per day of interdisciplinary therapy in a comprehensive inpatient rehab setting. Physiatrist is providing close team supervision and 24 hour management of active medical problems listed below. Physiatrist and rehab team continue to assess barriers to discharge/monitor patient progress toward functional and medical goals. FIM: FIM - Bathing Bathing: 0: Activity did not occur  FIM - Upper Body Dressing/Undressing Upper body dressing/undressing: 0: Activity did not occur FIM - Lower Body Dressing/Undressing Lower body dressing/undressing: 0: Activity did not occur  FIM - Toileting Toileting: 0: Activity did not occur  FIM - Archivist Transfers: 0-Activity did not occur  FIM - Games developer Transfer: 0: Activity did not occur     Comprehension Comprehension: 5-Understands complex 90% of the time/Cues < 10% of the time  Expression Expression Mode: Verbal Expression: 5-Expresses basic needs/ideas: With extra time/assistive device  Social Interaction Social Interaction: 5-Interacts appropriately 90% of the time - Needs monitoring or encouragement for participation or interaction.  Problem Solving Problem Solving: 5-Solves basic 90% of the time/requires cueing < 10% of the time  Memory Memory: 5-Recognizes or recalls 90% of the time/requires cueing < 10% of the time  1. Deconditioning secondary to diverticulitis/chronic obstructive pulmonary disease, respiratory failure  2. DVT Prophylaxis/Anticoagulation: Chronic Coumadin therapy for atrial fibrillation. Adjusting coumadin to therapeutic  level. Watch hgb closely given supratherapeutic  values 3. COPD. Chronic oxygen therapy. Continue nebulizer treatments as well as chronic steroid therapy. Follow pulmonary services as needed. Check oxygen saturations every shift. Decrease oxygen if/as able. 4. Chronic renal insufficiency. Baseline creatinine 2.5. Strict I/O. Labs pending today. 5. Congestive heart failure. Monitor for any signs of fluid overload. Follow weights. 6. Hyperglycemia. Felt to be related to steroids. No documentation of diabetes mellitus. Continue low-dose insulin therapy. Check blood sugars a.c. and at bedtime  7. Hypertension/atrial fibrillation. Metoprolol 12.5 mg twice daily. Monitor the increased mobility  8. Right lower extremity hematoma/skin tear. Vaseline gauze dressing daily. Will ask wound care nurse for followup.  9. Chronic anemia. Transfused one unit 05/18/2010. Followup CBC today. 10. Clostridium difficile. Flagyl completed. Contact precautions 11. Leukocytosis: steroid effect. Cbc today.  -on zosyn to cover pulmonary and pseudomonas in urine  -urine specimen collected 2/3  LOS (Days) 1 A FACE TO FACE EVALUATION WAS PERFORMED  Darnice Comrie T 05/20/2011, 7:25 AM

## 2011-05-20 NOTE — Evaluation (Signed)
Physical Therapy Assessment and Plan  Patient Details  Name: [name] MRN: [id number] Date of Birth: 01/20/1931  PT Diagnosis: Difficulty walking and Muscle weakness Rehab Potential: Good ELOS: 3 weeks   Today's Date: 05/20/2011 Time: 0900-1000 Time Calculation (min): 60 min  Assessment & Plan Clinical Impression: Patient is a 78 y.o. year old female with recent admission to the hospital on December 22 with abdominal discomfort mainly to the right lower quadrant associated nausea. She denied any diarrhea or fever. CT abdomen revealed acute diffuse diverticulitis. Seen by general surgery initially placed on TPN for nutritional support and later changed to nasogastric tube feeds. Her diet was slowly advanced and nasogastric tube removed. Patient developed altered mental status and hypoxic respiratory failure requiring intubation on December 27 and followed by critical care medicine. She was extubated December 29 after treatment for hospital acquired pneumonia. Due to her overall deconditioning and respiratory status she was discharged to [facility] on January 8 for ongoing medical management. Her hospital course at [facility] complicated by ongoing bouts of recurrent respiratory failure and repeat intubation.  Patient transferred to CIR on 05/19/2011 .  Patient's past medical history is significant for DM, obesity, CHF, COPD, CKD, PNA, anemia, gout, GERD.    Patient currently requires total with mobility secondary to muscle weakness and decreased cardiorespiratoy endurance and decreased oxygen support.  Prior to hospitalization, patient was Mod I with mobility and lived with Son in a House home.  Home access is 3Stairs to enter.  Patient will benefit from skilled PT intervention to maximize safe functional mobility, minimize fall risk and decrease caregiver burden for planned discharge home with 24 hour supervision.  Anticipate patient will benefit from follow up HH at discharge.  PT - End of  Session Activity Tolerance: Tolerates < 10 min activity with changes in vital signs Endurance Deficit: Yes Endurance Deficit Description: on 3 L O2, spO2 dropped to 84% during activyt, required 2-3 minutes seated rest to recover to greater than 90% PT Assessment Rehab Potential: Good PT Plan PT Frequency: 1-2 X/day, 60-90 minutes Estimated Length of Stay: 3 weeks PT Treatment/Interventions: Ambulation/gait training;Balance/vestibular training;DME/adaptive equipment instruction;Functional mobility training;Pain management;Patient/family education;Therapeutic Exercise;Therapeutic Activities;Stair training;UE/LE Strength taining/ROM;UE/LE Coordination activities;Wheelchair propulsion/positioning PT Recommendation Follow Up Recommendations: Home health PT  Precautions/Restrictions Precautions Precautions: Fall Required Braces or Orthoses: No Restrictions Weight Bearing Restrictions: No  Vital Signs Oxygen: on 3L O2 during treatment, O2 84% with activity, required 2-3 minutes seated rest to return to >90% Pain Pain Assessment Pain Score:   5 Pain Location: Hand Pain Orientation: Left Pain Intervention(s): RN made aware Home Living/Prior Functioning Home Living Lives With: Sheran Spine Help From: Family Type of Home: House Home Layout: One level Home Access: Stairs to enter Entrance Stairs-Rails: Can reach both Entrance Stairs-Number of Steps: 3 Home Adaptive Equipment: Walker - rolling;Wheelchair - manual Prior Function Level of Independence: Independent with basic ADLs;Requires assistive device for independence Able to Take Stairs?: Yes Driving: No  Cognition Overall Cognitive Status: Appears within functional limits for tasks assessed Sensation Sensation Light Touch: Appears Intact Proprioception: Appears Intact Coordination Gross Motor Movements are Fluid and Coordinated: Yes Motor  Motor Motor - Skilled Clinical Observations: generalized weakness   Mobility Transfers Sit to Stand: 1: +2 Total assist Sit to Stand Details (indicate cue type and reason): cues for forward wt shift, UE placement Locomotion  Ambulation Ambulation: No (pt unable due to weakness)  Trunk/Postural Assessment  Cervical Assessment Cervical Assessment:  (fwd head) Thoracic Assessment Thoracic Assessment:  (  kyphosis) Lumbar Assessment Lumbar Assessment:  (decreased AROM due to pain, weakness) Postural Control Postural Control: Within Functional Limits  Balance Static Standing Balance Static Standing - Level of Assistance: 1: +2 Total assist (pt unable to achieve full upright standing) Extremity Assessment      RLE Assessment RLE Assessment:  (grossly 3/5, unable to achive full upright standing) LLE Assessment LLE Assessment:  (grossly 3/5, unable to achieve full upright standing)  Recommendations for other services: None  Discharge Criteria: Patient will be discharged from PT if patient refuses treatment 3 consecutive times without medical reason, if treatment goals not met, if there is a change in medical status, if patient makes no progress towards goals or if patient is discharged from hospital.  The above assessment, treatment plan, treatment alternatives and goals were discussed and mutually agreed upon: by patient  Treatment initiated during session: Attempt sit to stand from W/c with +2 assist, pt unable to achieve full standing due to weakness in glutes, hips.  Scoot transfer w/c to mat with +2 assist, cues for pt to lean forward and use LEs to push.  Standing multiple attempts with [place] Dec plus, pt needs assistance to maintain full upright posture, tolerates standing 30-45 seconds before fatigue.  Pt c/o dizziness with activity, O2 > 90%, BP 132/72, RN made aware.  DONAWERTH,KAREN 05/20/2011, 10:37 AM

## 2011-05-20 NOTE — Progress Notes (Signed)
[facility] Individual Statement of Services  Patient Name:  [name]  Date:  05/20/2011  Welcome to the [facility]patient [facility].  Our goal is to provide you with an individualized program based on your diagnosis and situation, designed to meet your specific needs.  With this comprehensive rehabilitation program, you will be expected to participate in at least 3 hours of rehabilitation therapies Monday-Friday, with modified therapy programming on the weekends.  Your rehabilitation program will include the following services:  Physical Therapy (PT), Occupational Therapy (OT), 24 hour per day rehabilitation nursing, Therapeutic Recreaction (TR), Case Management (RN and Child psychotherapist), Rehabilitation Medicine, Nutrition Services and Pharmacy Services  Weekly team conferences will be held on  Tuesday  to discuss your progress.  Your RN Case Designer, television/film set will talk with you frequently to get your input and to update you on team discussions.  Team conferences with you and your family in attendance may also be held.  Estimated Length of Stay: about 3 weeks                              Goals: Supervision-Min Assist  Depending on your progress and recovery, your program may change.  Your RN Case Estate agent will coordinate services and will keep you informed of any changes.  Your RN Sports coach and SW names and contact numbers are listed  below.  The following services may also be recommended but are not provided by the [facility]:   Driving Evaluations  Home Health Rehabiltiation Services  Outpatient Rehabilitatation [facility]  Vocational Rehabilitation   Arrangements will be made to provide these services after discharge if needed.  Arrangements include referral to agencies that provide these services.  Your insurance has been verified to be:  Medicare Your primary doctor is:  Dr. Karleen Hampshire Copland  Pertinent  information will be shared with your doctor and your insurance company.  Case Manager: Melanee Spry, [facility] [phone]  Social Worker:  [place], [place] [phone]  Information discussed with and copy given to patient by: Brock Ra, 05/20/2011, 5:11 PM

## 2011-05-20 NOTE — Evaluation (Signed)
Occupational Therapy Assessment and Plan  Patient Details  Name: [name] MRN: [id number] Date of Birth: [birth year]  OT Diagnosis: abnormal posture and muscle weakness (generalized) Rehab Potential: Rehab Potential: Good ELOS: 3-4 weeks   Today's Date: 05/20/2011 Time: 0730-0830 Time Calculation (min): 60 min  Assessment & Plan Clinical Impression: Patient is a 78 y.o. year old right-handed obese female with history of atrial fibrillation on chronic Coumadin therapy, chronic obstructive pulmonary disease with home oxygen and prior ARDS requiring tracheostomy in 2005., congestive heart failure ,chronic renal insufficiency with baseline 2.5. Admitted December 22 with abdominal discomfort mainly to the right lower quadrant associated nausea. She denied any diarrhea or fever. CT abdomen revealed acute diffuse diverticulitis. Seen by general surgery initially placed on TPN for nutritional support and later changed to nasogastric tube feeds. Her diet was slowly advanced and nasogastric tube removed. Patient developed altered mental status and hypoxic respiratory failure requiring intubation on December 27 and followed by critical care medicine. She was extubated December 29 after treatment for hospital acquired pneumonia. Due to her overall deconditioning and respiratory status she was discharged to [facility] on January 8 for ongoing medical management. Her hospital course at [facility] complicated by ongoing bouts of recurrent respiratory failure and repeat intubation. Her chronic Coumadin had been held for a short time due to epistaxes which resolved and her Coumadin had since been resumed with cardiac rate control for atrial fibrillation. She did develop Clostridium difficile treated with Flagyl which has since been completed and remains on contact cautions. Bouts of anemia she was transfused one unit packed red blood cells on 2/7 for hemoglobin of 7.7 . Her white blood cell count remained elevated  18,000 felt to be induced secondary to steroids which she remained on her chronic obstructive pulmonary disease. Patient also with bouts of hyperglycemia felt to be again related to her steroids and placed on low-dose insulin therapy. She is presently completing a 10 day course empirically of Zosyn and vancomycin for coverage of her pulmonary status as well as Pseudomonas urinary tract infection that is end February 10. Right lower extremity hematoma/skin tear with Xeroform Vaseline gauze dressing daily that has been healing nicely.    Patient transferred to CIR on 05/19/2011 .     Patient currently requires total with basic self-care skills secondary to severe muscle weakness/ deconditioned, decreased cardiorespiratoy endurance and decreased oxygen support, unbalanced muscle activation,  and decreased sitting balance, decreased standing balance, decreased postural control, and decreased balance strategies.  Prior to hospitalization, patient could complete ADLs with independently.  Patient will benefit from skilled intervention to decrease level of assist with basic self-care skills and increase independence with basic self-care skills prior to discharge home with care partner.  Anticipate patient will require minimal physical assistance and follow up home health.  OT - End of Session Endurance Deficit: Yes Endurance Deficit Description: on 3 L O2, spO2 dropped to 84% during activyt, required 2-3 minutes seated rest to recover to greater than 90% OT Assessment Rehab Potential: Good OT Plan OT Frequency: 1-2 X/day, 60-90 minutes Estimated Length of Stay: 3-4 weeks OT Treatment/Interventions: Ambulation/gait training;DME/adaptive equipment instruction;Functional mobility training;Self Care/advanced ADL retraining;Therapeutic Activities;UE/LE Coordination activities;Therapeutic Exercise;UE/LE Strength taining/ROM;Patient/family education;Neuromuscular re-education;Community  reintegration;Balance/vestibular training OT Recommendation Follow Up Recommendations: Home health OT Equipment Details: TBD  Precautions/Restrictions  Precautions Precautions: Fall Required Braces or Orthoses: No Restrictions Weight Bearing Restrictions: No General Chart Reviewed: Yes Family/Caregiver Present: No Vital Signs Oxygen Therapy SpO2: 94 % O2 Device: Nasal cannula  O2 Flow Rate (L/min): 3 L/min Pain Pain Assessment Pain Assessment: No/denies pain Pain Score:   5 Pain Location: Hand Pain Orientation: Left Pain Intervention(s): RN made aware Home Living/Prior Functioning Home Living Lives With: Sheran Spine Help From: Family Type of Home: House Home Layout: One level Home Access: Stairs to enter Entrance Stairs-Rails: Can reach both Entrance Stairs-Number of Steps: 3 Bathroom Shower/Tub: Health visitor: Handicapped height Bathroom Accessibility: Yes How Accessible: Accessible via walker Home Adaptive Equipment: Walker - rolling;Wheelchair - manual Prior Function Level of Independence: Independent with basic ADLs;Requires assistive device for independence Able to Take Stairs?: Yes Driving: No ADL ADL Eating: Set up Grooming: Minimal assistance Where Assessed-Grooming: Sitting at sink Upper Body Bathing: Moderate assistance Where Assessed-Upper Body Bathing: Sitting at sink Lower Body Bathing: Dependent Upper Body Dressing: Moderate assistance Where Assessed-Upper Body Dressing: Sitting at sink Lower Body Dressing: Dependent (+2 ) Where Assessed-Lower Body Dressing: Standing at sink;Sitting at sink Toileting: Dependent (+2 total A) Where Assessed-Toileting: Bedside Commode Toilet Transfer: Maximal assistance Toilet Transfer Method: Squat pivot Toilet Transfer Equipment: Drop arm bedside commode Tub/Shower Transfer: Not assessed Vision/Perception  Vision - History Baseline Vision: No visual deficits Patient Visual Report: No  change from baseline Vision - Assessment Eye Alignment: Within Functional Limits Perception Perception: Within Functional Limits Praxis Praxis: Intact  Cognition Overall Cognitive Status: Appears within functional limits for tasks assessed Arousal/Alertness: Awake/alert Orientation Level: Oriented X4 Memory: Appears intact Awareness: Appears intact Problem Solving: Appears intact Safety/Judgment: Appears intact Sensation Sensation Light Touch: Appears Intact Stereognosis: Appears Intact Hot/Cold: Appears Intact Proprioception: Appears Intact Coordination Gross Motor Movements are Fluid and Coordinated: Yes Motor  Motor Motor: Abnormal postural alignment and control Motor - Skilled Clinical Observations: generalized weakness Mobility  Bed Mobility Bed Mobility: Yes Supine to Sit: 2: Max assist;HOB flat Scooting to HOB: 1: +2 Total assist Transfers Transfers: Yes Sit to Stand: 1: +2 Total assist Sit to Stand Details (indicate cue type and reason): cues for forward wt shift, UE placement Stand to Sit: 1: +2 Total assist  Trunk/Postural Assessment  Cervical Assessment Cervical Assessment:  (fwd head) Thoracic Assessment Thoracic Assessment: Within Functional Limits Lumbar Assessment Lumbar Assessment: Within Functional Limits Postural Control Postural Control: Deficits on evaluation Trunk Control: falls to the left and posterior in sitting requiring Assistance  Balance Balance Balance Assessed: Yes Static Sitting Balance Static Sitting - Balance Support: No upper extremity supported;Feet supported Static Sitting - Level of Assistance: 2: Max assist Static Sitting - Comment/# of Minutes: falling to the left in unsupported sitting Static Standing Balance Static Standing - Balance Support: During functional activity;Bilateral upper extremity supported Static Standing - Level of Assistance: 1: +2 Total assist Extremity/Trunk Assessment RUE Assessment RUE Assessment:  Within Functional Limits LUE Assessment LUE Assessment: Within Functional Limits  Recommendations for other services: None  Discharge Criteria: Patient will be discharged from OT if patient refuses treatment 3 consecutive times without medical reason, if treatment goals not met, if there is a change in medical status, if patient makes no progress towards goals or if patient is discharged from hospital.  The above assessment, treatment plan, treatment alternatives and goals were discussed and mutually agreed upon: by patient  Treatment 1:1 OT eval initiated and OT role, purpose and goals were discussed. Self care retraining at sink level. Pt demonstrated poor sitting balance at EOB, falling over to the left requiring assist to maintain midline, total A +2 to transfer to [facility], able to sit on [facility] with UE support  on arm rests with supervision, unable to fully stand with an upright posture with +2 for hygiene and clothing management. Pt demonstrates poor activity tolerance and requires frequent rest breaks.  Melonie Florida 05/20/2011, 10:50 AM

## 2011-05-20 NOTE — Progress Notes (Signed)
Physical Therapy Session Note  Patient Details  Name: [name] MRN: [id number] Date of Birth: 07-Apr-1931  Today's Date: 05/20/2011 Time: 6295-2841 Time Calculation (min): 26 min  Precautions: Precautions Precautions: Fall Precaution Comments: Monitor vitals Required Braces or Orthoses: No Restrictions Weight Bearing Restrictions: No  Short Term Goals: PT Short Term Goal 1: Pt will transfer bed <> chair with max A PT Short Term Goal 2: Pt will maintain standing with LRAD x 2 minutes with mod A PT Short Term Goal 3: Pt will perform bed mobility with mod A  Skilled Therapeutic Interventions/Progress Updates:    Vital Signs Therapy Vitals Pulse Rate: 92  BP: 127/74 mmHg Patient Position, if appropriate: Sitting Oxygen Therapy SpO2: 88 % O2 Device: Nasal cannula O2 Flow Rate (L/min): 3 L/min Pulse Oximetry Type: Continuous Pain Pain Assessment Pain Assessment: No/denies pain   S:  Pt still up in w/c, reports that she feels better when she is up.  Therapist offered to work on standing with the pt, but pt did not feel that she could work on standing due to fatigue.  Reporting today was the first time she has been up since admission to hospital in Dec.  Pt agreed to do exercises in w/c. Exercises  Pt performed 10 reps of ankle pumps, marching, LAQ, hip adduction, glut sets, and UE flexion with multiple rest breaks in between due to decreasing O2 sats to 87%.  Pt would recover in 1-2 minutes and continue.  Therapy/Group: Individual Therapy  Georges Mouse 05/20/2011, 3:41 PM

## 2011-05-20 NOTE — Progress Notes (Signed)
INITIAL ADULT NUTRITION ASSESSMENT Date: 05/20/2011   Time: 9:18 AM  Reason for Assessment: Low Braden  ASSESSMENT: Female 78 y.o.  Dx: COPD with acute exacerbation  Hx:  Past Medical History  Diagnosis Date  . Diabetes mellitus   . Obesity   . COPD (chronic obstructive pulmonary disease)     Moderate. PFTs (12/10): FVC 67%, FEV1 58%, ratio 60%, TLC 95%, RV 138%, DLCO 43% (moderate obstructive defect). She is on home oxygen that should be worn at all times.  . Diastolic CHF, chronic     Echo (3/12): EF 55-60%, mild LV hypertrophy, grade I diastolic dysfunction, mild left atrial enlargement, normal pulmonary artery pressure  . CKD (chronic kidney disease)     Cr 2.4 when last checked. 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 PNA (pneumonia)     h/o with ARDS in 2005; MRSA PNA with prolonged hospitalization in [facility] 11/11  . Diverticulum     Jejunal  . OSA (obstructive sleep apnea)     possible. When pt was sedated for TEE in May 2010, she did develop some upper airway obstruction  . Anemia     of chronic disease  . Hypothyroidism   . Gout   . Atrial fibrillation 07/2008    Failed cardioversion with TEE guidance in May 2010. Pt went back to NSR by 11/2008 with amiodarne  . Hypertension   . GERD (gastroesophageal reflux disease)   . Peripheral vascular disease   . Pneumonia   . Chronic kidney disease    Related Meds:     . albuterol  2.5 mg Nebulization Q6H  . antiseptic oral rinse  15 mL Mouth Rinse q12n4p  . budesonide  0.5 mg Nebulization BID  . chlorhexidine  15 mL Mouth Rinse BID  . famotidine  20 mg Oral QHS  . feeding supplement  30 mL Oral TID WC  . guaiFENesin  5 mL Oral QID  . hydrALAZINE  50 mg Oral Q8H  . insulin aspart  0-15 Units Subcutaneous TID WC  . insulin glargine  10 Units Subcutaneous QHS  . ipratropium  0.5 mg Nebulization Q6H  . metoprolol tartrate  12.5 mg Oral BID  . mulitivitamin with minerals  1 tablet  Oral Daily  . piperacillin-tazobactam (ZOSYN)  IV  3.375 g Intravenous Q8H  . predniSONE  30 mg Oral Q breakfast  . simvastatin  20 mg Oral q1800  . traMADol  50 mg Oral Daily  . warfarin  2 mg Oral Once   Ht: 5\' 3"  (160 cm)  Wt: 217 lb 6 oz (98.6 kg) on 2/7  Ideal Wt: 52.3 kg % Ideal Wt: 189%  Usual Wt:  Wt Readings from Last 3 Encounters:  05/19/11 217 lb 6 oz (98.601 kg)  04/15/11 217 lb 6.4 oz (98.612 kg)  01/04/11 209 lb 1.9 oz (94.856 kg)  % Usual Wt: 100%  Body mass index is 38.51 kg/(m^2). Pt meets criteria for Obesity Class II.  Food/Nutrition Related Hx: eating well since placed on diet  Labs:  CMP     Component Value Date/Time   NA 142 05/20/2011 0650   K 4.5 05/20/2011 0650   CL 105 05/20/2011 0650   CO2 25 05/20/2011 0650   GLUCOSE 74 05/20/2011 0650   BUN 70* 05/20/2011 0650   CREATININE 1.58* 05/20/2011 0650   CREATININE 2.65* 06/18/2010 1715   CALCIUM 8.8 05/20/2011 0650   PROT 6.1 05/20/2011 0650   ALBUMIN 2.2* 05/20/2011 0981  AST 8 05/20/2011 0650   ALT 10 05/20/2011 0650   ALKPHOS 58 05/20/2011 0650   BILITOT 0.5 05/20/2011 0650   GFRNONAA 30* 05/20/2011 0650   GFRAA 35* 05/20/2011 0650   CBG (last 3)   Basename 05/20/11 0731 05/19/11 2051 05/19/11 1655  GLUCAP 79 209* 210*    Lab Results  Component Value Date   HGBA1C 6.3* 05/18/2011   Intake/Output:      Diet Order: Carb Control Medium  Supplements/Tube Feeding: 30 ml Prostat TID  IVF:    Estimated Nutritional Needs:   Kcal:  1700 - 1900 kcal Protein:  75 - 85 grams protein Fluid:  1.7 - 1.9 L/d  Pt followed by RD during acute hospitalization. Pt received TPN during acute hospitalization. Wt appears to have remained stable. Pt transferred to [facility], now admitted to inpatient rehab for deconditioning 2/2 to diverticulitis/chronic obstructive pulmonary disease, respiratory failure. Currently on CHO modified medium diet, eating 100% of meals. Pt states she is eating fairly well at this time, and is able to  tolerate Prostat at this time, does not like Breeze.  NUTRITION DIAGNOSIS: Increased energy expenditure  RELATED TO: participation in rehab activities  AS EVIDENCE BY: estimated needs  MONITORING/EVALUATION(Goals): Goal: Pt to consume >/= 75% of meals and supplements. Met. Monitor: PO intake, weights, labs, I/O's  EDUCATION NEEDS: -No education needs identified at this time  INTERVENTION: 1. Pt likely meeting protein and kcal needs at this time with current PO intake. Decrease Prostat to PO BID, as pt is eating fairly well. 2. RD to follow nutrition care plan  Dietitian #: [phone]  DOCUMENTATION CODES Per approved criteria  -Obesity Unspecified    Adair Laundry 05/20/2011, 9:18 AM

## 2011-05-20 NOTE — Progress Notes (Signed)
ANTIBIOTIC/ANTICOAGULATION CONSULT NOTE - FOLLOW UP  Pharmacy Consult for Vancomycin/Coumadin Indication: PNA/afib  Allergies  Allergen Reactions  . Sulfonamide Derivatives     Childhood reaction    Patient Measurements: Height: 5\' 3"  (160 cm) Weight: 217 lb 6 oz (98.601 kg) (from [facility] records 04/19/11) IBW/kg (Calculated) : 52.4  Heparin Dosing Weight:   Vital Signs: Temp: 98.7 F (37.1 C) (02/08 0539) Temp src: Oral (02/08 0539) BP: 112/66 mmHg (02/08 0539) Pulse Rate: 87  (02/08 0539)  Labs:  Basename 05/20/11 0650 05/19/11 1359 05/19/11 0555 05/18/11 0550  HGB 8.2* -- 7.7* --  HCT 27.2* -- 25.3* 24.6*  PLT 197 -- 208 172  APTT -- -- -- --  LABPROT 16.4* 21.4* -- 42.0*  INR 1.30 1.82* -- 4.32*  HEPARINUNFRC -- -- -- --  CREATININE 1.58* -- 1.48* --  CKTOTAL -- -- -- --  CKMB -- -- -- --  TROPONINI -- -- -- --   Estimated Creatinine Clearance: 32.8 ml/min (by C-G formula based on Cr of 1.58).   Medications:  Scheduled:    . albuterol  2.5 mg Nebulization Q6H  . antiseptic oral rinse  15 mL Mouth Rinse q12n4p  . budesonide  0.5 mg Nebulization BID  . chlorhexidine  15 mL Mouth Rinse BID  . famotidine  20 mg Oral QHS  . feeding supplement  30 mL Oral BID  . guaiFENesin  5 mL Oral QID  . hydrALAZINE  50 mg Oral Q8H  . insulin aspart  0-15 Units Subcutaneous TID WC  . insulin glargine  10 Units Subcutaneous QHS  . ipratropium  0.5 mg Nebulization Q6H  . metoprolol tartrate  12.5 mg Oral BID  . mulitivitamin with minerals  1 tablet Oral Daily  . piperacillin-tazobactam (ZOSYN)  IV  3.375 g Intravenous Q8H  . predniSONE  30 mg Oral Q breakfast  . simvastatin  20 mg Oral q1800  . traMADol  50 mg Oral Daily  . warfarin  2 mg Oral Once  . DISCONTD: feeding supplement  30 mL Oral TID WC    Assessment: 78 yo female admitted to rehab unit from [facility]. Previously hospitalized at [facility] 04/02/11 Thru 04/19/11. Patient with history of atrial  fibrillation on chronic coumadin. At [facility] coumadin held for short time due to epistaxis which resolved and coumadin resumed. She continued on coumadin per pharmacy protocol at [facility]. Last Coumadin dose was 2mg  po on 2/3, INR 3.48 on 2/4 thus dose was held 2/4 and 2/5. On 2/6 the INR was high 4.32 thus Coumadin held. Vitamin K 10 mg SQ and Vitamin K 5mg  po given on 2/6.  Coumadin 5mg  x1 given last pm for INR=1.82. Today INR = 1.3. Coumadin is to be resumed for A. Fib  D#4 vancomycin/zosyn for PNA and pseudomonas UTI (it is resistant to zosyn, only susc to imipenem and tobramycin).  2/7 @05 :55 Vanco trough = 27.3mcg/ml on 1gm IV q24h.  Random level this am = 18.35mcg/ml.  From best of our knowledge no vancomycin was given 2/7 at [facility] prior to tx to CIR.  Goal of Therapy:  INR 2-3 Vanco trough = 15-20   Plan:  1. Spoke with Jesusita Oka A. PA-C = will d/c any further vancomycin doses as no resistant GPC, start imipenem 250mg  IV q6h for pseudomonas in urine  2.  Coumadin 2mg  PO x1 and daily INR  Dannielle Huh 05/20/2011,12:36 PM

## 2011-05-20 NOTE — Progress Notes (Signed)
Social Work  Assessment and Plan  Patient Name: [name]  AVWUJ'W Date: 05/20/2011  Problem List:  Patient Active Problem List  Diagnoses  . HYPOTHYROIDISM  . DIABETES MELLITUS, TYPE II  . OVERWEIGHT/OBESITY  . OBSTRUCTIVE SLEEP APNEA  . HYPERTENSION, UNSPECIFIED  . Atrial fibrillation  . DIASTOLIC HEART FAILURE, CHRONIC  . COPD  . RENAL FAILURE, CHRONIC  . SLEEP APNEA  . HYPERLIPIDEMIA-MIXED  . Fatigue  . Osteoarthritis  . Venous reflux  . Fever  . Bradycardia  . Diverticulitis of jejunum  . Atelectasis  . Hyperkalemia  . C. difficile colitis  . Physical deconditioning  . COPD with acute exacerbation    Past Medical History:  Past Medical History  Diagnosis Date  . Diabetes mellitus   . Obesity   . COPD (chronic obstructive pulmonary disease)     Moderate. PFTs (12/10): FVC 67%, FEV1 58%, ratio 60%, TLC 95%, RV 138%, DLCO 43% (moderate obstructive defect). She is on home oxygen that should be worn at all times.  . Diastolic CHF, chronic     Echo (3/12): EF 55-60%, mild LV hypertrophy, grade I diastolic dysfunction, mild left atrial enlargement, normal pulmonary artery pressure  . CKD (chronic kidney disease)     Cr 2.4 when last checked. 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 PNA (pneumonia)     h/o with ARDS in 2005; MRSA PNA with prolonged hospitalization in [facility] 11/11  . Diverticulum     Jejunal  . OSA (obstructive sleep apnea)     possible. When pt was sedated for TEE in May 2010, she did develop some upper airway obstruction  . Anemia     of chronic disease  . Hypothyroidism   . Gout   . Atrial fibrillation 07/2008    Failed cardioversion with TEE guidance in May 2010. Pt went back to NSR by 11/2008 with amiodarne  . Hypertension   . GERD (gastroesophageal reflux disease)   . Peripheral vascular disease   . Pneumonia   . Chronic kidney disease     Past Surgical History:  Past Surgical History    Procedure Date  . Tracheostomy     ARDS/ ICU admission, trach 2005  . Microperforation 08/2008    Of jejunem, ICU admission    Discharge Planning  Discharge Planning Living Arrangements: Children (Lives with son and daughter-in-law) Support Systems: Children Assistance Needed: none PTA Do you have any problems obtaining your medications?: No Type of Residence: Private residence Home Care Services: No (None PTA) Patient expects to be discharged to:: plans to d/c to daughter's home in [place], [place] "for a couple of weeks...til I can get around better" Expected Discharge Date:  (TBD) Case Management Consult Needed: Yes (Comment) (following)  Social/Family/Support Systems Social/Family/Support Systems Patient Roles: Parent Contact Information: Karr Leverne Humbles - daughter ([place]); Danise Edge - son [facility]) and Stefano Gaul - daughter [facility]) Anticipated Caregiver: Ellin Mayhew and her family (husband retired and 28 y.o. 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   Denies any emotional distress. Formal depression screen deferred at this time as no indicators noted during interniew. Recent Psychosocial Issues: pt widowed 7/12 after lengthy health problems and SNF placement Pyschiatric History: none  Patient/Family Perceptions, Expectations & Goals Pt/Family Perceptions, Expectations and Goals Pt/Family understanding of illness & functional limitations: pt with good, basic understanding of medical issues leading to current state of deconditioning and need for CIR.  Able to provide good report of medical course. Premorbid pt/family roles/activities: Elderly woman who was living with son and daughter -in-law PTA and independent overall but on chronic O2.  Family assisted with transportation needs. Anticipated changes in roles/activities/participation: Daughter and her family to assume caregiver roles/ duties on a temporary basis (pt hopes on a couple of weeks) Pt/family expectations/goals: Pt hopes to have reach a basic independent level of function within a couple of weeks after d/c and be able to return to [place] with son.  Community Resources [employer] Agencies: None Premorbid Home Care/DME Agencies: Other (Comment) ([facility] provided all DME) Transportation available at discharge: yes  Discharge Assessment Discharge Planning Insurance Resources: Medicare Financial Resources: Social Security Financial Screen Referred: No Living Expenses: Lives with family Money Management: Family Home Management: pt and family Patient/Family Preliminary Plans: home with daughter and son-in-law where 24/7 can be provided  Clinical Impression:  Pleasant, oriented, elderly woman here for deconditioning. Excellent family support between three adult children.  Temp plans to stay with daughter after d/c where 24/7 can be provided.  Pt denies any emotional distress but will monitor as CIR progresses.  Megan Salon,  Yarixa Lightcap 05/20/2011

## 2011-05-21 DIAGNOSIS — R5381 Other malaise: Secondary | ICD-10-CM

## 2011-05-21 DIAGNOSIS — G7281 Critical illness myopathy: Secondary | ICD-10-CM

## 2011-05-21 DIAGNOSIS — Z5189 Encounter for other specified aftercare: Secondary | ICD-10-CM

## 2011-05-21 LAB — PROTIME-INR: INR: 1.23 (ref 0.00–1.49)

## 2011-05-21 LAB — GLUCOSE, CAPILLARY: Glucose-Capillary: 140 mg/dL — ABNORMAL HIGH (ref 70–99)

## 2011-05-21 MED ORDER — WARFARIN SODIUM 4 MG PO TABS
4.0000 mg | ORAL_TABLET | Freq: Once | ORAL | Status: AC
  Administered 2011-05-21: 4 mg via ORAL
  Filled 2011-05-21: qty 1

## 2011-05-21 MED ORDER — IPRATROPIUM BROMIDE 0.02 % IN SOLN
0.5000 mg | Freq: Three times a day (TID) | RESPIRATORY_TRACT | Status: DC
  Administered 2011-05-21 – 2011-05-23 (×3): 0.5 mg via RESPIRATORY_TRACT
  Filled 2011-05-21 (×3): qty 2.5

## 2011-05-21 MED ORDER — ALBUTEROL SULFATE (5 MG/ML) 0.5% IN NEBU
2.5000 mg | INHALATION_SOLUTION | Freq: Three times a day (TID) | RESPIRATORY_TRACT | Status: DC
  Administered 2011-05-21 – 2011-05-23 (×3): 2.5 mg via RESPIRATORY_TRACT
  Filled 2011-05-21 (×3): qty 0.5

## 2011-05-21 NOTE — Progress Notes (Signed)
Patient ID: [name], female   DOB: 1930-07-15, 78 y.o.   MRN: [id number] Subjective/Complaints: Review of Systems  Respiratory: Positive for cough and shortness of breath.   All other systems reviewed and are negative.  slept fairly well. Anxious to start rehab 2/8   Objective: Vital Signs: Blood pressure 122/68, pulse 60, temperature 98.2 F (36.8 C), temperature source Oral, resp. rate 16, height 5\' 3"  (1.6 m), weight 98.601 kg (217 lb 6 oz), SpO2 90.00%. No results found.  Basename 05/20/11 0650 05/19/11 0555  WBC 12.8* 17.3*  HGB 8.2* 7.7*  HCT 27.2* 25.3*  PLT 197 208    Basename 05/20/11 0650 05/19/11 0555  NA 142 139  K 4.5 4.1  CL 105 105  CO2 25 26  GLUCOSE 74 111*  BUN 70* 59*  CREATININE 1.58* 1.48*  CALCIUM 8.8 8.8   CBG (last 3)   Basename 05/21/11 0733 05/20/11 2117 05/20/11 1714  GLUCAP 81 154* 173*    Wt Readings from Last 3 Encounters:  05/19/11 98.601 kg (217 lb 6 oz)  04/15/11 98.612 kg (217 lb 6.4 oz)  01/04/11 94.856 kg (209 lb 1.9 oz)    Physical Exam:  General appearance: alert, cooperative, no distress and morbidly obese Head: Normocephalic, without obvious abnormality, atraumatic Eyes: conjunctivae/corneas clear. PERRL, EOM's intact. Fundi benign. Ears: normal TM's and external ear canals both ears Nose: Nares normal. Septum midline. Mucosa normal. No drainage or sinus tenderness. Throat: lips, mucosa, and tongue normal; teeth and gums normal Neck: no adenopathy, no carotid bruit, no JVD, supple, symmetrical, trachea midline and thyroid not enlarged, symmetric, no tenderness/mass/nodules Back: symmetric, no curvature. ROM normal. No CVA tenderness. Resp: rhonchi bilaterally Cardio: regular rate and rhythm, S1, S2 normal, no murmur, click, rub or gallop GI: soft, non-tender; bowel sounds normal; no masses,  no organomegaly Extremities: edema 1+ bilateral lower ext's trace in ue's Pulses: 2+ and symmetric Skin: Skin color,  texture, turgor normal. No rashes or lesions Neurologic: moves all 4's with prox greater than distal weakness (3- to 4- out of 5) no sensory isues Incision/Wound: lac on right forearm and right leg with dressing and mild sanginous d/c.    Assessment/Plan: 1. Functional deficits secondary to profound deconditioning after respiratory failure, diverticulitis which require 3+ hours per day of interdisciplinary therapy in a comprehensive inpatient rehab setting. Physiatrist is providing close team supervision and 24 hour management of active medical problems listed below. Physiatrist and rehab team continue to assess barriers to discharge/monitor patient progress toward functional and medical goals. FIM: FIM - Bathing Bathing: 0: Activity did not occur  FIM - Upper Body Dressing/Undressing Upper body dressing/undressing: 0: Activity did not occur FIM - Lower Body Dressing/Undressing Lower body dressing/undressing: 0: Activity did not occur  FIM - Toileting Toileting: 1: Total-Patient completed zero steps, helper did all 3  FIM - Archivist Transfers: 0-Activity did not occur  FIM - Games developer Transfer: 1: Mechanical lift  FIM - Locomotion: Wheelchair Locomotion: Wheelchair: 1: Total Assistance/staff pushes wheelchair (Pt<25%) FIM - Locomotion: Ambulation Locomotion: Ambulation: 0: Activity did not occur  Comprehension Comprehension: 5-Understands complex 90% of the time/Cues < 10% of the time  Expression Expression Mode: Verbal Expression: 5-Expresses basic needs/ideas: With extra time/assistive device  Social Interaction Social Interaction: 4-Interacts appropriately 75 - 89% of the time - Needs redirection for appropriate language or to initiate interaction.  Problem Solving Problem Solving: 5-Solves basic 90% of the time/requires cueing < 10% of the time  Memory  Memory: 5-Recognizes or recalls 90% of the time/requires cueing < 10% of the  time  1. Deconditioning secondary to diverticulitis/chronic obstructive pulmonary disease, respiratory failure  2. DVT Prophylaxis/Anticoagulation: Chronic Coumadin therapy for atrial fibrillation. Adjusting coumadin to therapeutic level. Watch hgb closely given supratherapeutic values 3. COPD. Chronic oxygen therapy. Continue nebulizer treatments as well as chronic steroid therapy. Follow pulmonary services as needed. Check oxygen saturations every shift. Decrease oxygen if/as able. 4. Chronic renal insufficiency. Baseline creatinine 2.5. Strict I/O. Labs pending today. 5. Congestive heart failure. Monitor for any signs of fluid overload. Follow weights. 6. Hyperglycemia. Felt to be related to steroids. No documentation of diabetes mellitus. Continue low-dose insulin therapy. Check blood sugars a.c. and at bedtime  7. Hypertension/atrial fibrillation. Metoprolol 12.5 mg twice daily. Monitor the increased mobility  8. Right lower extremity hematoma/skin tear. Vaseline gauze dressing daily. Will ask wound care nurse for followup.  9. Chronic anemia. Transfused one unit 05/18/2010. Followup CBC improved but still low at 8.2         10. Clostridium difficile. Flagyl completed. Contact precautions 11. Leukocytosis: steroid effect. Cbc today.  -on zosyn to cover pulmonary and pseudomonas in urine  -urine specimen collected 2/3 12.  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

## 2011-05-21 NOTE — Progress Notes (Signed)
Occupational Therapy Session Note  Patient Details  Name: [name] MRN: [id number] Date of Birth: 07-26-1930  Today's Date: 05/21/2011 Time: 1400-1430 Time Calculation (min): 30 min  2nd session  Skilled Therapeutic Interventions: Therapeutic activity with emphasis on functional mobility using w/c in room, toilet transfers, patient ed on use of upper extremities during transfers and weight-shifting.  Patient required repeated cues and hand-over-hand assist to position her hands on arm rests of w/c in prep for toilet transfers.  Patient much weaker this session and was not able to maintain static standing or progress through toileting sequence (clothing management prior to squat or stand-pivot transfer to toilet with rails).   Transfer back to bed was also more difficult and required max assist vs. Mod assist during a.m. treatment session.  Recommend emphasis on UE strengthening during follow-up treatments.     Therapy: Individual Therapy  Georgeanne Nim 05/21/2011, 4:13 PM

## 2011-05-21 NOTE — Progress Notes (Signed)
Physical Therapy Note  Patient Details  Name: [name] MRN: [id number] Date of Birth: 10-Aug-1930 Today's Date: 05/21/2011 Time; 1600-1700 (60')  Pain; No c/o pain  Precautions; Falls Risk, O2 at 3L via [place]  Therapeutic Exercise; (30') Supine B LE's including ankle pumps, heel slides, and quad setting                                               Seated B LE's  Therapeutic Activity; (30') Bed mobility with S/min-A rolling and scooting                                            Transfer supine to sit with Mod-A, sit-stand Mod-A x 2, 2 sidesteps Left with Mod-A   Individual Therapy Session    Jodelle Gross 05/21/2011, 4:17 PM

## 2011-05-21 NOTE — Progress Notes (Signed)
ANTIBIOTIC/ANTICOAGULATION CONSULT NOTE - FOLLOW UP  Pharmacy Consult for Vancomycin/Coumadin Indication: PNA/afib  Allergies  Allergen Reactions  . Sulfonamide Derivatives     Childhood reaction    Patient Measurements: Height: 5\' 3"  (160 cm) Weight: 217 lb 6 oz (98.601 kg) (from [facility] records 04/19/11) IBW/kg (Calculated) : 52.4  Heparin Dosing Weight:   Vital Signs: Temp: 98.2 F (36.8 C) (02/09 0625) Temp src: Oral (02/09 0625) BP: 122/68 mmHg (02/09 0625) Pulse Rate: 60  (02/09 0625)  Labs:  Basename 05/21/11 0630 05/20/11 0650 05/19/11 1359 05/19/11 0555  HGB -- 8.2* -- 7.7*  HCT -- 27.2* -- 25.3*  PLT -- 197 -- 208  APTT -- -- -- --  LABPROT 15.8* 16.4* 21.4* --  INR 1.23 1.30 1.82* --  HEPARINUNFRC -- -- -- --  CREATININE -- 1.58* -- 1.48*  CKTOTAL -- -- -- --  CKMB -- -- -- --  TROPONINI -- -- -- --   Estimated Creatinine Clearance: 32.8 ml/min (by C-G formula based on Cr of 1.58).   Medications:  Scheduled:     . albuterol  2.5 mg Nebulization TID  . antiseptic oral rinse  15 mL Mouth Rinse q12n4p  . budesonide  0.5 mg Nebulization BID  . chlorhexidine  15 mL Mouth Rinse BID  . famotidine  20 mg Oral QHS  . feeding supplement  30 mL Oral BID  . guaiFENesin  5 mL Oral QID  . hydrALAZINE  50 mg Oral Q8H  . imipenem-cilastatin  250 mg Intravenous Q6H  . insulin aspart  0-15 Units Subcutaneous TID WC  . insulin glargine  10 Units Subcutaneous QHS  . ipratropium  0.5 mg Nebulization TID  . metoprolol tartrate  12.5 mg Oral BID  . mulitivitamin with minerals  1 tablet Oral Daily  . predniSONE  30 mg Oral Q breakfast  . simvastatin  20 mg Oral q1800  . traMADol  50 mg Oral Daily  . warfarin  2 mg Oral ONCE-1800  . DISCONTD: albuterol  2.5 mg Nebulization Q6H  . DISCONTD: albuterol  2.5 mg Nebulization QID  . DISCONTD: feeding supplement  30 mL Oral TID WC  . DISCONTD: ipratropium  0.5 mg Nebulization Q6H  . DISCONTD: ipratropium  0.5 mg  Nebulization QID  . DISCONTD: piperacillin-tazobactam (ZOSYN)  IV  3.375 g Intravenous Q8H    Assessment: 78 yo female admitted to rehab unit from [facility]. Previously hospitalized at [facility] 04/02/11 Thru 04/19/11. Patient with history of atrial fibrillation on chronic coumadin. At [facility] coumadin held for short time due to epistaxis which resolved and coumadin resumed. She continued on coumadin per pharmacy protocol at [facility]. Last Coumadin dose at [facility] was 2mg  po on 2/3, INR 3.48 on 2/4 thus dose was held 2/4 and 2/5. On 2/6 the INR was high 4.32 thus Coumadin held. Vitamin K 10 mg SQ and Vitamin K 5mg  po given on 2/6. Today's INR = 1.23. Surprised INR is trending down base on previous dosing and INR values.   D#2 Imipenem, s/p 4 days of vancomycin/zosyn for PNA and pseudomonas UTI (zosyn changed to imipenem based on 2/3 urine cx revealing it is resistant to zosyn, only susc to imipenem and tobramycin). Spoke with Jesusita Oka A. PA-C and discussed abx regiment, stopped vanco and zosyn and started imipenem.  2/7 @05 :55 Vanco trough = 27.77mcg/ml on 1gm IV q24h.  Random level this am = 18.76mcg/ml.  From best of our knowledge no vancomycin was given 2/7 at [facility] prior  to tx to CIR.   Goal of Therapy:  INR 2-3   Plan:  1. Coumadin 4mg  tonight and continue daily INR  Dannielle Huh 05/21/2011,9:54 AM

## 2011-05-21 NOTE — Progress Notes (Signed)
Occupational Therapy Session Note  Patient Details  Name: [name] MRN: [id number] Date of Birth: 05/19/1930  Today's Date: 05/21/2011 Time: 0815-0900 Time Calculation (min): 45 min  Precautions: Precautions Precautions: Fall Precaution Comments: Monitor vitals Required Braces or Orthoses: No Weight Bearing Restrictions: No  Short Term Goals: OT Short Term Goal 1: Pt would perform bed mobility to get to EOB with mod A in order to perform ADLs - Ongoing  OT Short Term Goal 2: Pt will sit EOB/ EOM without UE support in static sitting for 3 minutes with min A to increased balance during ADL - Ongoing  OT Short Term Goal 3: Pt will don shirt in supported sitting with min A - Ongoing  OT Short Term Goal 4: Pt will perform all grooming tasks at sink with supervision - Ongoing  Skilled Therapeutic Interventions/Progress Updates: ADL-retraining at sink side with emphasis on transfers in/out of bed, static and dynamic sitting balance, energy conservation and symptom management (due to complaint of light-headed, dizzyness, general weakness), and caregiver education (daughter present throughout treatment).   Patient demo's lean to left when supine in bed but was able to maintain static sitting, prior to transfers, using right UE for support.   Transition from supine to sit required 3-5 minute rest break to allow patient to resolve c/o dizzyness.   Patient participated well in ADL and maintained focus on goals of treatment with only minor complaint of pain at buttocks during transfer back to bed for assisted toileting (bedpan).          ADL ADL Eating: Set up Grooming: Minimal assistance Where Assessed-Grooming: Sitting at sink Upper Body Bathing: Moderate assistance Where Assessed-Upper Body Bathing: Sitting at sink Lower Body Bathing: Dependent Upper Body Dressing: Moderate assistance Where Assessed-Upper Body Dressing: Sitting at sink   Therapy/Group: Individual  Therapy  Georgeanne Nim 05/21/2011, 1:14 PM

## 2011-05-21 NOTE — Progress Notes (Signed)
Physical Therapy Note  Patient Details  Name: [name] MRN: [id number] Date of Birth: 04/18/1930 Today's Date: 05/21/2011 Time; 0900-1000 (60') Pain; No c/o pain Precautions; (Monitor O2 sats) 2L O2 at rest and 3L with activity  Therapeutic Exercise; (30') Supine B LE's including ankle pumps, heel slides, and quad setting                                              Seated B LE's Therapeutic Activity; (30') Bed mobility with S/min-A rolling and scooting                                           Transfer supine to sit with Mod-A, sit-stand Mod-A, bed-chair Mod-A O2 sats at rest 95% with 3L via [place] and 94% with 2L. O2 sats post activity 89% with 3L via [place]. Patient was monitored throughout tx session and encouraged in pursed lip breathing with frequent rest breaks.  Individual Therapy Session   Jodelle Gross 05/21/2011, 9:08 AM

## 2011-05-22 LAB — CULTURE, BLOOD (ROUTINE X 2)
Culture  Setup Time: 201302040259
Culture: NO GROWTH

## 2011-05-22 LAB — PROTIME-INR
INR: 1.14 (ref 0.00–1.49)
Prothrombin Time: 14.8 seconds (ref 11.6–15.2)

## 2011-05-22 LAB — GLUCOSE, CAPILLARY

## 2011-05-22 MED ORDER — WARFARIN SODIUM 5 MG PO TABS
5.0000 mg | ORAL_TABLET | Freq: Once | ORAL | Status: AC
  Administered 2011-05-22: 5 mg via ORAL
  Filled 2011-05-22: qty 1

## 2011-05-22 NOTE — Progress Notes (Signed)
Patient ID: [name], female   DOB: 04-19-1930, 78 y.o.   MRN: [id number] Subjective/Complaints: Breathing a bit shallow but no cough Review of Systems  Respiratory: Positive for cough and shortness of breath.   All other systems reviewed and are negative.    Objective: Vital Signs: Blood pressure 146/69, pulse 76, temperature 98.8 F (37.1 C), temperature source Oral, resp. rate 18, height 5\' 3"  (1.6 m), weight 98.601 kg (217 lb 6 oz), SpO2 97.00%. No results found.  Basename 05/20/11 0650  WBC 12.8*  HGB 8.2*  HCT 27.2*  PLT 197    Basename 05/20/11 0650  NA 142  K 4.5  CL 105  CO2 25  GLUCOSE 74  BUN 70*  CREATININE 1.58*  CALCIUM 8.8   CBG (last 3)   Basename 05/22/11 0736 05/21/11 2054 05/21/11 1709  GLUCAP 85 149* 140*    Wt Readings from Last 3 Encounters:  05/19/11 98.601 kg (217 lb 6 oz)  04/15/11 98.612 kg (217 lb 6.4 oz)  01/04/11 94.856 kg (209 lb 1.9 oz)    Physical Exam:  General appearance: alert, cooperative, no distress and morbidly obese Head: Normocephalic, without obvious abnormality, atraumatic Eyes: conjunctivae/corneas clear. PERRL, EOM's intact. Fundi benign. Ears: normal TM's and external ear canals both ears Nose: Nares normal. Septum midline. Mucosa normal. No drainage or sinus tenderness. Throat: lips, mucosa, and tongue normal; teeth and gums normal Neck: no adenopathy, no carotid bruit, no JVD, supple, symmetrical, trachea midline and thyroid not enlarged, symmetric, no tenderness/mass/nodules Back: symmetric, no curvature. ROM normal. No CVA tenderness. Resp: rhonchi bilaterally Cardio: regular rate and rhythm, S1, S2 normal, no murmur, click, rub or gallop GI: soft, non-tender; bowel sounds normal; no masses,  no organomegaly Extremities: edema 1+ bilateral lower ext's trace in ue's Pulses: 2+ and symmetric Skin: Skin color, texture, turgor normal. No rashes or lesions Neurologic: moves all 4's with prox greater than  distal weakness (3-hips to 4-shoulders out of 5) no sensory isues Incision/Wound: lac on right forearm and right leg with dressing and mild sanginous d/c.    Assessment/Plan: 1. Functional deficits secondary to profound deconditioning after respiratory failure, diverticulitis which require 3+ hours per day of interdisciplinary therapy in a comprehensive inpatient rehab setting. Physiatrist is providing close team supervision and 24 hour management of active medical problems listed below. Physiatrist and rehab team continue to assess barriers to discharge/monitor patient progress toward functional and medical goals. FIM: FIM - Bathing Bathing Steps Patient Completed: Chest;Right Arm;Left Arm;Abdomen Bathing: 4: Min-Patient completes 8-9 21f 10 parts or 75+ percent  FIM - Upper Body Dressing/Undressing Upper body dressing/undressing steps patient completed: Thread/unthread right bra strap;Thread/unthread left bra strap;Thread/unthread right sleeve of pullover shirt/dresss;Thread/unthread left sleeve of pullover shirt/dress Upper body dressing/undressing: 3: Mod-Patient completed 50-74% of tasks FIM - Lower Body Dressing/Undressing Lower body dressing/undressing: 1: Total-Patient completed less than 25% of tasks  FIM - Toileting Toileting: 1: Total-Patient completed zero steps, helper did all 3  FIM - Archivist Transfers: 0-Activity did not occur  FIM - Banker Devices: HOB elevated;Arm rests;Bed rails Bed/Chair Transfer: 3: Supine > Sit: Mod A (lifting assist/Pt. 50-74%/lift 2 legs;3: Sit > Supine: Mod A (lifting assist/Pt. 50-74%/lift 2 legs);2: Bed > Chair or W/C: Max A (lift and lower assist);3: Chair or W/C > Bed: Mod A (lift or lower assist)  FIM - Locomotion: Wheelchair Locomotion: Wheelchair: 1: Total Assistance/staff pushes wheelchair (Pt<25%) FIM - Locomotion: Ambulation Locomotion: Ambulation: 0: Activity did not  occur  Comprehension Comprehension Mode: Auditory Comprehension: 5-Understands basic 90% of the time/requires cueing < 10% of the time  Expression Expression Mode: Verbal Expression: 5-Expresses basic needs/ideas: With extra time/assistive device  Social Interaction Social Interaction: 4-Interacts appropriately 75 - 89% of the time - Needs redirection for appropriate language or to initiate interaction.  Problem Solving Problem Solving: 5-Solves basic 90% of the time/requires cueing < 10% of the time  Memory Memory: 5-Recognizes or recalls 90% of the time/requires cueing < 10% of the time  1. Deconditioning secondary to diverticulitis/chronic obstructive pulmonary disease, respiratory failure  2. DVT Prophylaxis/Anticoagulation: Chronic Coumadin therapy for atrial fibrillation. Adjusting coumadin to therapeutic level. Watch hgb closely given supratherapeutic values 3. COPD. Chronic oxygen therapy. Continue nebulizer treatments as well as chronic steroid therapy. Follow pulmonary services as needed. Check oxygen saturations every shift. Decrease oxygen if/as able. 4. Chronic renal insufficiency. Baseline creatinine 2.5. Strict I/O. Labs pending today. 5. Congestive heart failure. Monitor for any signs of fluid overload. Follow weights. 6. Hyperglycemia. Felt to be related to steroids. No documentation of diabetes mellitus. Continue low-dose insulin therapy. Check blood sugars a.c. and at bedtime  7. Hypertension/atrial fibrillation. Metoprolol 12.5 mg twice daily. Monitor the increased mobility  8. Right lower extremity hematoma/skin tear. Vaseline gauze dressing daily. Will ask wound care nurse for followup.  9. Chronic anemia. Transfused one unit 05/18/2010. Followup CBC improved but still low at 8.2         10. Clostridium difficile. Flagyl completed. Contact precautions 11. Leukocytosis: steroid effect.  -on zosyn to cover pulmonary and pseudomonas in urine   12.  Critical illness  myopathy proximal> distal weakness all 4 limbs deltoid strength improving  LOS (Days) 3 A FACE TO FACE EVALUATION WAS PERFORMED  KIRSTEINS,ANDREW E 05/22/2011, 8:26 AM

## 2011-05-22 NOTE — Progress Notes (Signed)
To stand patient with 2 person stand pivot to [facility], pt to stand and then slowly loose strength to legs and knees and was assisted to floor by staff, lift used to place back to bed, no resp distress noted, no complains of pain, no trauma noted due to assisted to floor, dr notified and son notified no new orders at this time, will continue to monitor

## 2011-05-22 NOTE — Progress Notes (Signed)
ANTIBIOTIC/ANTICOAGULATION CONSULT NOTE - FOLLOW UP  Pharmacy Consult for Vancomycin/Coumadin Indication: PNA/afib  Allergies  Allergen Reactions  . Sulfonamide Derivatives     Childhood reaction    Patient Measurements: Height: 5\' 3"  (160 cm) Weight: 217 lb 6 oz (98.601 kg) (from [facility] records 04/19/11) IBW/kg (Calculated) : 52.4  Heparin Dosing Weight:   Vital Signs: Temp: 98.8 F (37.1 C) (02/10 0614) Temp src: Oral (02/10 0614) BP: 146/69 mmHg (02/10 0614) Pulse Rate: 76  (02/10 0614)  Labs:  Basename 05/22/11 0648 05/21/11 0630 05/20/11 0650  HGB -- -- 8.2*  HCT -- -- 27.2*  PLT -- -- 197  APTT -- -- --  LABPROT 14.8 15.8* 16.4*  INR 1.14 1.23 1.30  HEPARINUNFRC -- -- --  CREATININE -- -- 1.58*  CKTOTAL -- -- --  CKMB -- -- --  TROPONINI -- -- --   Estimated Creatinine Clearance: 32.8 ml/min (by C-G formula based on Cr of 1.58).   Medications:  Scheduled:     . albuterol  2.5 mg Nebulization TID  . antiseptic oral rinse  15 mL Mouth Rinse q12n4p  . budesonide  0.5 mg Nebulization BID  . chlorhexidine  15 mL Mouth Rinse BID  . famotidine  20 mg Oral QHS  . feeding supplement  30 mL Oral BID  . guaiFENesin  5 mL Oral QID  . hydrALAZINE  50 mg Oral Q8H  . imipenem-cilastatin  250 mg Intravenous Q6H  . insulin aspart  0-15 Units Subcutaneous TID WC  . insulin glargine  10 Units Subcutaneous QHS  . ipratropium  0.5 mg Nebulization TID  . metoprolol tartrate  12.5 mg Oral BID  . mulitivitamin with minerals  1 tablet Oral Daily  . predniSONE  30 mg Oral Q breakfast  . simvastatin  20 mg Oral q1800  . traMADol  50 mg Oral Daily  . warfarin  4 mg Oral ONCE-1800    Assessment: 78 yo female admitted to rehab unit from [facility]. Previously hospitalized at [facility] 04/02/11 Thru 04/19/11. Patient with history of atrial fibrillation on chronic coumadin. At [facility] coumadin held for short time due to epistaxis which resolved and coumadin resumed. She  continued on coumadin per pharmacy protocol at [facility]. Last Coumadin dose at [facility] was 2mg  po on 2/3, INR 3.48 on 2/4 thus dose was held 2/4 and 2/5. On 2/6 the INR was high 4.32 thus Coumadin held. Vitamin K 10 mg SQ and Vitamin K 5mg  po given on 2/6. Today's INR = 1.14 (trending down).  Doses charted as given. Surprised INR is trending down base on previous dosing and INR values.   D#3 Imipenem, s/p 4 days of vancomycin/zosyn for PNA and pseudomonas UTI (zosyn changed to imipenem based on 2/3 urine cx revealing it is resistant to zosyn, only susc to imipenem and tobramycin). Spoke with Jesusita Oka A. PA-C and discussed abx regimen, stopped vanco and zosyn and started imipenem.  2/7 @05 :55 Vanco trough = 27.81mcg/ml on 1gm IV q24h.  Random level this am = 18.61mcg/ml.  From best of our knowledge no vancomycin was given 2/7 at [facility] prior to tx to [facility].   Goal of Therapy:  INR 2-3   Plan:  1. Coumadin 5mg  tonight and continue daily INR. Hesitant to go up on dose too much based on recent doses at [facility] and response.   Dannielle Huh 05/22/2011,10:40 AM

## 2011-05-22 NOTE — Progress Notes (Signed)
p up to chair with 2 person assist

## 2011-05-22 NOTE — Progress Notes (Signed)
Occupational Therapy Session Note  Patient Details  Name: [name] MRN: [id number] Date of Birth: Nov 16, 1930  Today's Date: 05/22/2011 Time: 4098-1191 Time Calculation (min): 45 min  Precautions: Precautions Precautions: Fall Precaution Comments: Monitor vitals Required Braces or Orthoses: No Restrictions Weight Bearing Restrictions: No  Pain: 7/10, buttocks from documented bed sore (RN providing care, as needed), patient satisfied with current management.  Short Term Goals: OT Short Term Goal 1: Pt would perform bed mobility to get to EOB with mod A in order to perform ADLs OT Short Term Goal 2: Pt will sit EOB/ EOM without UE support in static sitting for 3 minutes with min A to increased balance during ADL OT Short Term Goal 3: Pt will don shirt in supported sitting with min A OT Short Term Goal 4: Pt will perform all grooming tasks at sink with supervision  Skilled Therapeutic Interventions: Therapeutic activities with emphasis on UE exercise in bed using thera-putty and thera-band.  Patient was placed in bed just prior to treatment, per RN, due to fatigue from prolonged sitting in w/c prior to treatment and fatigue from attempted transfer from w/c to bedside commode.  Patient complained of fatigue and declined offer for assisted transfer back to w/c to participate in OT session.  Bedside treatment completed with re-ed on hand strengthening exercises (theraputty) and written directions provided for gross grasp, finger extension, thumb flexion and lateral pinch.  Patient able to only participate with minimal effort due to complaint of fatigue and poor frustration tolerance with management of IV while side-lying.  Patient advised to continue hand exercises as feasible and examine literature provided.  Therapy: Individual Therapy  Georgeanne Nim 05/22/2011, 4:59 PM

## 2011-05-22 NOTE — Progress Notes (Addendum)
dsg changed to right lower extremity, area with small amount old bloody drainage noted to old dressing,  Blistered area noted to wound site and to have small amt clear sero sang drainage  vaso line gauze and 4/4 with Desma Paganini place

## 2011-05-23 LAB — GLUCOSE, CAPILLARY
Glucose-Capillary: 79 mg/dL (ref 70–99)
Glucose-Capillary: 128 mg/dL — ABNORMAL HIGH (ref 70–99)

## 2011-05-23 LAB — PROTIME-INR: INR: 1.16 (ref 0.00–1.49)

## 2011-05-23 MED ORDER — ALBUTEROL SULFATE (5 MG/ML) 0.5% IN NEBU
2.5000 mg | INHALATION_SOLUTION | Freq: Two times a day (BID) | RESPIRATORY_TRACT | Status: DC
  Administered 2011-05-23 – 2011-06-01 (×16): 2.5 mg via RESPIRATORY_TRACT
  Filled 2011-05-23 (×17): qty 0.5

## 2011-05-23 MED ORDER — IPRATROPIUM BROMIDE 0.02 % IN SOLN
0.5000 mg | Freq: Two times a day (BID) | RESPIRATORY_TRACT | Status: DC
  Administered 2011-05-23 – 2011-06-01 (×16): 0.5 mg via RESPIRATORY_TRACT
  Filled 2011-05-23 (×17): qty 2.5

## 2011-05-23 MED ORDER — WARFARIN SODIUM 5 MG PO TABS
5.0000 mg | ORAL_TABLET | Freq: Once | ORAL | Status: AC
  Administered 2011-05-23: 5 mg via ORAL
  Filled 2011-05-23: qty 1

## 2011-05-23 NOTE — Progress Notes (Signed)
Patient ID: [name], female   DOB: Oct 03, 1930, 78 y.o.   MRN: [id number] Patient ID: [name], female   DOB: May 29, 1930, 78 y.o.   MRN: [id number] Subjective/Complaints: 2/11- no real complaints. breathing ok and bowel and bladder habits near normal   Review of Systems  Respiratory: Positive for cough and shortness of breath.   All other systems reviewed and are negative.    Objective: Vital Signs: Blood pressure 151/74, pulse 79, temperature 98.1 F (36.7 C), temperature source Oral, resp. rate 20, height 5\' 3"  (1.6 m), weight 98.601 kg (217 lb 6 oz), SpO2 94.00%. No results found. No results found for this basename: WBC:2,HGB:2,HCT:2,PLT:2 in the last 72 hours No results found for this basename: NA:2,K:2,CL:2,CO2:2,GLUCOSE:2,BUN:2,CREATININE:2,CALCIUM:2 in the last 72 hours CBG (last 3)   Basename 05/23/11 0727 05/22/11 2132 05/22/11 1655  GLUCAP 79 157* 259*    Wt Readings from Last 3 Encounters:  05/19/11 98.601 kg (217 lb 6 oz)  04/15/11 98.612 kg (217 lb 6.4 oz)  01/04/11 94.856 kg (209 lb 1.9 oz)    Physical Exam:  General appearance: alert, cooperative, no distress and morbidly obese Head: Normocephalic, without obvious abnormality, atraumatic Eyes: conjunctivae/corneas clear. PERRL, EOM's intact. Fundi benign. Ears: normal TM's and external ear canals both ears Nose: Nares normal. Septum midline. Mucosa normal. No drainage or sinus tenderness. Throat: lips, mucosa, and tongue normal; teeth and gums normal Neck: no adenopathy, no carotid bruit, no JVD, supple, symmetrical, trachea midline and thyroid not enlarged, symmetric, no tenderness/mass/nodules Back: symmetric, no curvature. ROM normal. No CVA tenderness. Resp: rhonchi bilaterally comfortable with 2L oxygen Cardio: regular rate and rhythm, S1, S2 normal, no murmur, click, rub or gallop GI: soft, non-tender; bowel sounds normal; no masses,  no organomegaly Extremities: edema 1+ bilateral lower ext's  trace in ue's Pulses: 2+ and symmetric Skin: Skin color, texture, turgor normal. No rashes or lesions Neurologic: moves all 4's with prox greater than distal weakness (3-hips to 4-shoulders out of 5) no sensory isues Incision/Wound: lac on right forearm and right leg with dressing and mild sanginous d/c. Dressed appropriately.   Assessment/Plan: 1. Functional deficits secondary to profound deconditioning after respiratory failure, diverticulitis which require 3+ hours per day of interdisciplinary therapy in a comprehensive inpatient rehab setting. Physiatrist is providing close team supervision and 24 hour management of active medical problems listed below. Physiatrist and rehab team continue to assess barriers to discharge/monitor patient progress toward functional and medical goals. FIM: FIM - Bathing Bathing Steps Patient Completed: Chest;Right Arm;Left Arm;Abdomen Bathing: 4: Min-Patient completes 8-9 52f 10 parts or 75+ percent  FIM - Upper Body Dressing/Undressing Upper body dressing/undressing steps patient completed: Thread/unthread right bra strap;Thread/unthread left bra strap;Thread/unthread right sleeve of pullover shirt/dresss;Thread/unthread left sleeve of pullover shirt/dress Upper body dressing/undressing: 3: Mod-Patient completed 50-74% of tasks FIM - Lower Body Dressing/Undressing Lower body dressing/undressing: 1: Total-Patient completed less than 25% of tasks  FIM - Toileting Toileting: 1: Total-Patient completed zero steps, helper did all 3  FIM - Archivist Transfers: 0-Activity did not occur  FIM - Banker Devices: HOB elevated;Arm rests;Bed rails Bed/Chair Transfer: 1: Two helpers  FIM - Locomotion: Wheelchair Locomotion: Wheelchair: 1: Total Assistance/staff pushes wheelchair (Pt<25%) FIM - Locomotion: Ambulation Ambulation/Gait Assistance: 2: Max assist Locomotion: Ambulation: 0: Activity did not  occur  Comprehension Comprehension Mode: Auditory Comprehension: 5-Understands basic 90% of the time/requires cueing < 10% of the time  Expression Expression Mode: Verbal Expression: 5-Expresses basic needs/ideas: With  extra time/assistive device  Social Interaction Social Interaction: 4-Interacts appropriately 75 - 89% of the time - Needs redirection for appropriate language or to initiate interaction.  Problem Solving Problem Solving: 5-Solves basic 90% of the time/requires cueing < 10% of the time  Memory Memory: 5-Recognizes or recalls 90% of the time/requires cueing < 10% of the time  1. Deconditioning secondary to diverticulitis/chronic obstructive pulmonary disease, respiratory failure  2. DVT Prophylaxis/Anticoagulation: Chronic Coumadin therapy for atrial fibrillation. Adjusting coumadin to therapeutic level. Watch hgb closely given supratherapeutic values 3. COPD. Chronic oxygen therapy. Continue nebulizer treatments as well as chronic steroid therapy. Follow pulmonary services as needed. Check oxygen saturations every shift. Decrease oxygen if/as able although she was using at night PTA 4. Chronic renal insufficiency. Baseline creatinine 2.5. Strict I/O.  5. Congestive heart failure. Monitor for any signs of fluid overload. Follow weights. Exam unremarkable 6. Hyperglycemia. Felt to be related to steroids. No documentation of diabetes mellitus. Continue low-dose insulin therapy. Sugars under reasonable control for the most part. 7. Hypertension/atrial fibrillation. Metoprolol 12.5 mg twice daily. Monitor the increased mobility  8. Right lower extremity hematoma/skin tear. Vaseline gauze dressing daily.  9. Chronic anemia. Transfused one unit 05/18/2010. Recheck hgb tomorrow  10. Clostridium difficile. Flagyl completed. Contact precautions 11. Leukocytosis: steroid effect.  -on zosyn to cover pulmonary and pseudomonas in urine  -afebrile   12.  Critical illness myopathy  proximal> distal weakness all 4 limbs deltoid strength improving  LOS (Days) 4 A FACE TO FACE EVALUATION WAS PERFORMED  Ezinne Yogi T 05/23/2011, 8:44 AM

## 2011-05-23 NOTE — Progress Notes (Signed)
Occupational Therapy Session Note  Patient Details  Name: [name] MRN: [id number] Date of Birth: Feb 07, 1931  Today's Date: 05/23/2011 Time: 1030-1130 Time Calculation (min): 60 min  Precautions: Precautions Precautions: Fall Precaution Comments: Monitor vitals Required Braces or Orthoses: No Restrictions Weight Bearing Restrictions: No  Short Term Goals: OT Short Term Goal 1: Pt would perform bed mobility to get to EOB with mod A in order to perform ADLs OT Short Term Goal 2: Pt will sit EOB/ EOM without UE support in static sitting for 3 minutes with min A to increased balance during ADL OT Short Term Goal 3: Pt will don shirt in supported sitting with min A OT Short Term Goal 4: Pt will perform all grooming tasks at sink with supervision  Skilled Therapeutic Interventions/Progress Updates:    1:1 self care retraining with emphasis on dressing today. Sitting in recliner for UB dressing with focus on trunk support, trunk alignment at midline, finding and maintaining midline, sitting balance without back support with at least one UE for support. Unable to maintain sitting balance to thread underwear and pants, but able to pull them up to her thighs Perform sit to stand for peri-hygiene and LB clothing management with SARA PLUS. Once standing pt able to fully extend hips and tuck gluts in, increased activation of core with bilateral UE support. Pt able to perform standing tolerance for 1 minute before needing to sit and rest. Once pt's weight is shifted onto feet and anterior and has bottom clearance pt able perform terminal hip and knee extension with knees blocked. Performed small weight shifts in SARA PLUS standing but unable to continue this exercise for 1 min before fatiguing.  Pain   c/o bottom discomfort - relief with repositioned   Therapy/Group: Individual Therapy  Melonie Florida 05/23/2011, 1:21 PM

## 2011-05-23 NOTE — Progress Notes (Signed)
Occupational Therapy Session Note  Patient Details  Name: [name] MRN: [id number] Date of Birth: 06/20/1930  Today's Date: 05/23/2011 Time: 4098-1191 Time Calculation (min): 45 min  Precautions: Precautions Precautions: Fall Precaution Comments: Monitor vitals Required Braces or Orthoses: No Restrictions Weight Bearing Restrictions: No  Short Term Goals: OT Short Term Goal 1: Pt would perform bed mobility to get to EOB with mod A in order to perform ADLs OT Short Term Goal 2: Pt will sit EOB/ EOM without UE support in static sitting for 3 minutes with min A to increased balance during ADL OT Short Term Goal 3: Pt will don shirt in supported sitting with min A OT Short Term Goal 4: Pt will perform all grooming tasks at sink with supervision  Skilled Therapeutic Interventions/Progress Updates:    1:1 self care retraining focus: on recliner to w/c with slide board with Mod A (one person) going to the left, squat pivot transfer w/c<> toilet with grab bar total A +2 max A, total A +2 with lateral leans for toileting steps, slide board transfers w/c <> mat in gym to increase independence with transfers and decrease burden of care during ADL, Max A to the right and mod A to the left with A to maintain forward posture and A with weight shift.  Pain  c/o sore buttocks- RN aware  Therapy/Group: Individual Therapy  Melonie Florida 05/23/2011, 3:32 PM

## 2011-05-23 NOTE — Progress Notes (Signed)
ANTIBIOTIC/ANTICOAGULATION CONSULT NOTE - FOLLOW UP  Pharmacy Consult for Vancomycin/Coumadin Indication: PNA/afib  Allergies  Allergen Reactions  . Sulfonamide Derivatives     Childhood reaction    Patient Measurements: Height: 5\' 3"  (160 cm) Weight: 217 lb 6 oz (98.601 kg) (from [facility] records 04/19/11) IBW/kg (Calculated) : 52.4  Heparin Dosing Weight:   Vital Signs: Temp: 98.1 F (36.7 C) (02/11 0621) Temp src: Oral (02/11 0621) BP: 151/74 mmHg (02/11 0621) Pulse Rate: 79  (02/11 0621)  Labs:  Basename 05/23/11 0700 05/22/11 0648 05/21/11 0630  HGB -- -- --  HCT -- -- --  PLT -- -- --  APTT -- -- --  LABPROT 15.0 14.8 15.8*  INR 1.16 1.14 1.23  HEPARINUNFRC -- -- --  CREATININE -- -- --  CKTOTAL -- -- --  CKMB -- -- --  TROPONINI -- -- --   Estimated Creatinine Clearance: 32.8 ml/min (by C-G formula based on Cr of 1.58).   Medications:  Scheduled:     . albuterol  2.5 mg Nebulization BID  . antiseptic oral rinse  15 mL Mouth Rinse q12n4p  . budesonide  0.5 mg Nebulization BID  . chlorhexidine  15 mL Mouth Rinse BID  . famotidine  20 mg Oral QHS  . feeding supplement  30 mL Oral BID  . guaiFENesin  5 mL Oral QID  . hydrALAZINE  50 mg Oral Q8H  . imipenem-cilastatin  250 mg Intravenous Q6H  . insulin aspart  0-15 Units Subcutaneous TID WC  . insulin glargine  10 Units Subcutaneous QHS  . ipratropium  0.5 mg Nebulization BID  . metoprolol tartrate  12.5 mg Oral BID  . mulitivitamin with minerals  1 tablet Oral Daily  . predniSONE  30 mg Oral Q breakfast  . simvastatin  20 mg Oral q1800  . traMADol  50 mg Oral Daily  . warfarin  5 mg Oral ONCE-1800  . warfarin  5 mg Oral ONCE-1800  . DISCONTD: albuterol  2.5 mg Nebulization TID  . DISCONTD: ipratropium  0.5 mg Nebulization TID    Assessment: 78 yo female admitted to rehab unit from [facility]. Previously hospitalized at [facility] 04/02/11 Thru 04/19/11. Patient with history of atrial  fibrillation on chronic coumadin. At [facility] coumadin held for short time due to epistaxis which resolved and coumadin resumed. She continued on coumadin per pharmacy protocol at [facility]. Last Coumadin dose at [facility] was 2mg  po on 2/3, INR 3.48 on 2/4 thus dose was held 2/4 and 2/5. On 2/6 the INR was high 4.32 thus Coumadin held. Vitamin K 10 mg SQ and Vitamin K 5mg  po given on 2/6. Today's INR = 1.16   Doses charted as given.   D#4 Imipenem, s/p 4 days of vancomycin/zosyn for PNA and pseudomonas UTI (zosyn changed to imipenem based on 2/3 urine cx revealing it is resistant to zosyn, only susc to imipenem and tobramycin). Spoke with Jesusita Oka A. PA-C and discussed abx regimen, stopped vanco and zosyn and started imipenem.    Goal of Therapy:  INR 2-3   Plan:  1. Coumadin 5mg  tonight and continue daily INR. Hesitant to go up on dose too much based on recent doses at [facility] and response.   Talbert Cage Poteet 05/23/2011,9:34 AM

## 2011-05-23 NOTE — Progress Notes (Signed)
Physical Therapy Note  Patient Details  Name: [name] MRN: [id number] Date of Birth: 09/12/1930 Today's Date: 05/23/2011  Time: 1500-1529 29 minutes  No c/o pain.  Treatment focus on sliding board transfers multiple reps w/c <> mat with mod A, cues for wt shifts and technique.  Pt continues to require frequent rest breaks but has increased strength and endurance, able to complete multiple reps of activity within session.  Individual therapy   Brielle Moro 05/23/2011, 3:29 PM

## 2011-05-23 NOTE — Progress Notes (Signed)
Physical Therapy Note  Patient Details  Name: [name] MRN: [id number] Date of Birth: [birth year] Today's Date: 05/23/2011  Time: 800-855 55 minutes  No c/o pain.  Bed mobility and scooting EOB with max assist.  Cues for wt shifting and technique for scooting laterally and fwd/bkwd edge of bed.  Standing in [place] plus multiple attempts with alternating UE lifts, glute squeezes, focus on posture and core strengthening.  Pt able to increase standing tolerance today. Able to stand 2 minutes before needing seated rest.  Bed <> recliner transfer with + 2 assist.  Pt with difficulty wt shifting enough to unweight bottom to ease transfers.  Continued practice in recliner with wt shifts, scooting with max A.  Individual therapy   DONAWERTH,KAREN 05/23/2011, 8:48 AM

## 2011-05-23 NOTE — Progress Notes (Signed)
Recreational Therapy Assessment and Plan  Patient Details  Name: [name] MRN: [id number] Date of Birth: Mar 28, 1931  Rehab Potential:  Good ELOS: 3 weeks  Assessment Clinical Impression: Patient is a 78 y.o. year old female with recent admission to the hospital on December 22 with abdominal discomfort mainly to the right lower quadrant associated nausea. She denied any diarrhea or fever. CT abdomen revealed acute diffuse diverticulitis. Seen by general surgery initially placed on TPN for nutritional support and later changed to nasogastric tube feeds. Her diet was slowly advanced and nasogastric tube removed. Patient developed altered mental status and hypoxic respiratory failure requiring intubation on December 27 and followed by critical care medicine. She was extubated December 29 after treatment for hospital acquired pneumonia. Due to her overall deconditioning and respiratory status she was discharged to [facility] on January 8 for ongoing medical management. Her hospital course at [facility] complicated by ongoing bouts of recurrent respiratory failure and repeat intubation. Patient transferred to CIR on 05/19/2011 . Patient's past medical history is significant for DM, obesity, CHF, COPD, CKD, PNA, anemia, gout, GERD.   Pt presents with decreased activity tolerance, decreased oxygen support, decreased functional mobility, decreased balance limiting pt's independence with leisure/community pursuits.    Plan Min 1 time per week >20 minutes  Recommendations for other services: None  Discharge Criteria: Patient will be discharged from TR if patient refuses treatment 3 consecutive times without medical reason.  If treatment goals not met, if there is a change in medical status, if patient makes no progress towards goals or if patient is discharged from hospital.  The above assessment, treatment plan, treatment alternatives and goals were discussed and mutually agreed upon: by  patient  Ortha Metts 05/23/2011, 3:55 PM

## 2011-05-24 LAB — GLUCOSE, CAPILLARY: Glucose-Capillary: 197 mg/dL — ABNORMAL HIGH (ref 70–99)

## 2011-05-24 LAB — CBC
HCT: 28.6 % — ABNORMAL LOW (ref 36.0–46.0)
Hemoglobin: 8.8 g/dL — ABNORMAL LOW (ref 12.0–15.0)
MCH: 28.4 pg (ref 26.0–34.0)
MCHC: 30.8 g/dL (ref 30.0–36.0)
MCV: 92.3 fL (ref 78.0–100.0)

## 2011-05-24 MED ORDER — WARFARIN SODIUM 4 MG PO TABS
4.0000 mg | ORAL_TABLET | Freq: Once | ORAL | Status: AC
  Administered 2011-05-24: 4 mg via ORAL
  Filled 2011-05-24 (×2): qty 1

## 2011-05-24 NOTE — Progress Notes (Signed)
Occupational Therapy Session Note  Patient Details  Name: TRIVIA HEFFELFINGER MRN: [id number] Date of Birth: [birth year]  Today's Date: 05/24/2011 Time: 4098-1191 Time Calculation (min): 55 min  Skilled Therapeutic Interventions:  Discussed need for pressure relief and reviewed techniques when patient is seated in the W/C.  Patient return demonstrated lateral leans and forward leans with supervision/cues.  While in forward flexion, patient applied lotion to dry legs.  Patient performed sit><stand from W/C to [facility] lift with max assist to stand and mod assist to sit.  Patient only able to remain upright for ~2 minutes.  Patient stated that she really wants to use the commode more often therefore we reviewed technique for sliding board transfers onto drop arm commode and use of the STEDY as another option.  Precautions:  Precautions Precautions: Fall Precaution Comments: O2 Required Braces or Orthoses: No Restrictions Weight Bearing Restrictions: No Pain: No complaints of pain  See FIM for current functional status  Therapy/Group: Individual Therapy  Maggi Hershkowitz 05/24/2011, 5:59 PM

## 2011-05-24 NOTE — Plan of Care (Signed)
Problem: RH SKIN INTEGRITY Goal: RH STG MAINTAIN SKIN INTEGRITY WITH ASSISTANCE STG Maintain Skin Integrity With Min Assistance.  Outcome: Not Progressing Dry peri area when incontinent to reduce skin irritation and breakdown

## 2011-05-24 NOTE — Progress Notes (Signed)
ANTIBIOTIC/ANTICOAGULATION CONSULT NOTE - FOLLOW UP  Pharmacy Consult for Primaxin/Coumadin Indication: PNA,UTI/afib  Allergies  Allergen Reactions  . Sulfonamide Derivatives     Childhood reaction    Patient Measurements: Height: 5\' 3"  (160 cm) Weight: 217 lb 6 oz (98.601 kg) (from [facility] records 04/19/11) IBW/kg (Calculated) : 52.4  Heparin Dosing Weight:   Vital Signs: Temp: 98.2 F (36.8 C) (02/12 0550) Temp src: Oral (02/12 0550) BP: 153/73 mmHg (02/12 0550) Pulse Rate: 105  (02/12 0828)  Labs:  Basename 05/24/11 0608 05/23/11 0700 05/22/11 0648  HGB 8.8* -- --  HCT 28.6* -- --  PLT 271 -- --  APTT -- -- --  LABPROT 17.4* 15.0 14.8  INR 1.40 1.16 1.14  HEPARINUNFRC -- -- --  CREATININE -- -- --  CKTOTAL -- -- --  CKMB -- -- --  TROPONINI -- -- --   Estimated Creatinine Clearance: 32.8 ml/min (by C-G formula based on Cr of 1.58).   Medications:  Scheduled:     . albuterol  2.5 mg Nebulization BID  . antiseptic oral rinse  15 mL Mouth Rinse q12n4p  . budesonide  0.5 mg Nebulization BID  . chlorhexidine  15 mL Mouth Rinse BID  . famotidine  20 mg Oral QHS  . feeding supplement  30 mL Oral BID  . guaiFENesin  5 mL Oral QID  . hydrALAZINE  50 mg Oral Q8H  . imipenem-cilastatin  250 mg Intravenous Q6H  . insulin aspart  0-15 Units Subcutaneous TID WC  . insulin glargine  10 Units Subcutaneous QHS  . ipratropium  0.5 mg Nebulization BID  . metoprolol tartrate  12.5 mg Oral BID  . mulitivitamin with minerals  1 tablet Oral Daily  . predniSONE  30 mg Oral Q breakfast  . simvastatin  20 mg Oral q1800  . traMADol  50 mg Oral Daily  . warfarin  5 mg Oral ONCE-1800    Assessment: 78 yo female admitted to rehab unit from [facility]. Previously hospitalized at [facility] 04/02/11 Thru 04/19/11. Patient with history of atrial fibrillation on chronic coumadin. At [facility] coumadin held for short time due to epistaxis which resolved and coumadin resumed. She  continued on coumadin per pharmacy protocol at [facility]. Last Coumadin dose at [facility] was 2mg  po on 2/3, INR 3.48 on 2/4 thus dose was held 2/4 and 2/5. On 2/6 the INR was high 4.32 thus Coumadin held. Vitamin K 10 mg SQ and Vitamin K 5mg  po given on 2/6. Today's INR = 1.82  Doses charted as given.   D#5 Imipenem, s/p 4 days of vancomycin/zosyn for PNA and pseudomonas UTI (zosyn changed to imipenem based on 2/3 urine cx revealing it is resistant to zosyn, only susc to imipenem and tobramycin).   Goal of Therapy:  INR 2-3   Plan:  1. Coumadin 4mg  tonight and continue daily INR. Hesitant to go up on dose too much based on recent doses at [facility] and response.   Talbert Cage P[place]et 05/24/2011,9:31 AM

## 2011-05-24 NOTE — Progress Notes (Addendum)
Physical Therapy Session Note  Patient Details  Name: [name] MRN: [id number] Date of Birth: 1930-07-10  Today's Date: 05/24/2011 Time: 0804-0900 Time Calculation (min): 56 min  Short Term Goals: Week 1:  PT Short Term Goal 1(DO NOT USE): Pt will transfer bed <> chair with max A PT Short Term Goal 2 (DO NOT USE): Pt will maintain standing with LRAD x 2 minutes with mod A PT Short Term Goal 3 (DO NOT USE): Pt will perform bed mobility with mod A  Skilled Therapeutic Interventions/Progress Updates:    Therapeutic activity: transfer training encouraging push off with LE's while using slide board, standing with lite gait Gait training- with lite gait Multiple seated rest breaks d/t decreased activity tolerance, dypsnea 3/4 with activity Initiated w/c propulsion training, mod A and instruction to turn  Therapy Documentation Precautions:  Precautions Precautions: Fall Precaution Comments: O2 Required Braces or Orthoses: No Restrictions Weight Bearing Restrictions: No   Vital Signs: Therapy Vitals Pulse Rate: 105  (with activity) Oxygen Therapy SpO2: 97 % O2 Device: Nasal cannula O2 Flow Rate (L/min): 3 L/min Pain: none Mobility: Bed Mobility Supine to Sit: HOB flat;3: Mod assist;With rails Transfers Lateral/Scoot Transfers: With armrests removed;1: +2 Total assist (+2 A, pt= 50%) Lateral/Scoot Transfer Details: Verbal cues for precautions/safety;Visual cues/gestures for sequencing;Verbal cues for sequencing;Tactile cues for placement;Verbal cues for safe use of DME/AE Locomotion : Ambulation Ambulation: Yes Ambulation/Gait Assistance: 1: +2 Total assist Ambulation Distance (Feet): 13 Feet Assistive device: Body weight support system Ambulation/Gait Assistance Details (indicate cue type and reason): standing and stepping in lite gait working on activating LE's/extensors in hip and knees; straps in back tight to aid trunk extension/upright posture, scale read 100  BWS Gait Gait: No  30 feet second trial    Balance: Static Sitting Balance Static Sitting - Level of Assistance: 7: Independent Static Sitting - Comment/# of Minutes: eating EOB without UE support  See FIM for current functional status  Therapy/Group: Individual Therapy  Michaelene Song 05/24/2011, 10:03 AM

## 2011-05-24 NOTE — Progress Notes (Signed)
Subjective/Complaints: 2/12- no new complaints.   Review of Systems  Respiratory: Positive for cough and shortness of breath.   All other systems reviewed and are negative.    Objective: Vital Signs: Blood pressure 153/73, pulse 105, temperature 98.2 F (36.8 C), temperature source Oral, resp. rate 20, height 5\' 3"  (1.6 m), weight 98.601 kg (217 lb 6 oz), SpO2 95.00%. No results found.  Basename 05/24/11 0608  WBC 11.3*  HGB 8.8*  HCT 28.6*  PLT 271   No results found for this basename: NA:2,K:2,CL:2,CO2:2,GLUCOSE:2,BUN:2,CREATININE:2,CALCIUM:2 in the last 72 hours CBG (last 3)   Basename 05/24/11 0726 05/23/11 2104 05/23/11 1609  GLUCAP 87 177* 141*    Wt Readings from Last 3 Encounters:  05/19/11 98.601 kg (217 lb 6 oz)  04/15/11 98.612 kg (217 lb 6.4 oz)  01/04/11 94.856 kg (209 lb 1.9 oz)    Physical Exam:  General appearance: alert, cooperative, no distress and morbidly obese Head: Normocephalic, without obvious abnormality, atraumatic Eyes: conjunctivae/corneas clear. PERRL, EOM's intact. Fundi benign. Ears: normal TM's and external ear canals both ears Nose: Nares normal. Septum midline. Mucosa normal. No drainage or sinus tenderness. Throat: lips, mucosa, and tongue normal; teeth and gums normal Neck: no adenopathy, no carotid bruit, no JVD, supple, symmetrical, trachea midline and thyroid not enlarged, symmetric, no tenderness/mass/nodules Back: symmetric, no curvature. ROM normal. No CVA tenderness. Resp: rhonchi bilaterally comfortable with 2L oxygen Cardio: regular rate and rhythm, S1, S2 normal, no murmur, click, rub or gallop GI: soft, non-tender; bowel sounds normal; no masses,  no organomegaly Extremities: edema 1+ bilateral lower ext's trace in ue's Pulses: 2+ and symmetric Skin: Skin color, texture, turgor normal. No rashes or lesions Neurologic: moves all 4's with prox greater than distal weakness (3-hips to 4-shoulders out of 5) no sensory  isues Incision/Wound: lac on right forearm and right leg with dressing and mild sanginous d/c. Dressed appropriately. 2/12 exam  Assessment/Plan: 1. Functional deficits secondary to profound deconditioning after respiratory failure, diverticulitis which require 3+ hours per day of interdisciplinary therapy in a comprehensive inpatient rehab setting. Physiatrist is providing close team supervision and 24 hour management of active medical problems listed below. Physiatrist and rehab team continue to assess barriers to discharge/monitor patient progress toward functional and medical goals. FIM: FIM - Bathing Bathing Steps Patient Completed: Chest;Right Arm;Left Arm;Abdomen Bathing: 2: Max-Patient completes 3-4 84f 10 parts or 25-49%  FIM - Upper Body Dressing/Undressing Upper body dressing/undressing steps patient completed: Thread/unthread left sleeve of pullover shirt/dress;Put head through opening of pull over shirt/dress;Thread/unthread right sleeve of pullover shirt/dresss Upper body dressing/undressing: 4: Min-Patient completed 75 plus % of tasks FIM - Lower Body Dressing/Undressing Lower body dressing/undressing: 1: Total-Patient completed less than 25% of tasks  FIM - Toileting Toileting: 1: Two helpers  FIM - Archivist Transfers: 1-Two helpers  FIM - Banker Devices: HOB elevated;Arm rests;Bed rails Bed/Chair Transfer: 1: Two helpers  FIM - Locomotion: Wheelchair Locomotion: Wheelchair: 0: Activity did not occur FIM - Locomotion: Ambulation Ambulation/Gait Assistance: 1: +2 Total assist Locomotion: Ambulation: 0: Activity did not occur  Comprehension Comprehension Mode: Auditory Comprehension: 5-Follows basic conversation/direction: With no assist  Expression Expression Mode: Verbal Expression: 5-Expresses complex 90% of the time/cues < 10% of the time  Social Interaction Social Interaction: 6-Interacts  appropriately with others with medication or extra time (anti-anxiety, antidepressant).  Problem Solving Problem Solving: 5-Solves basic problems: With no assist  Memory Memory: 6-More than reasonable amt of time  1. Deconditioning  secondary to diverticulitis/chronic obstructive pulmonary disease, respiratory failure  2. DVT Prophylaxis/Anticoagulation: Chronic Coumadin therapy for atrial fibrillation. Adjusting coumadin to therapeutic level. Watch hgb closely given supratherapeutic values 3. COPD. Chronic oxygen therapy. Continue nebulizer treatments as well as chronic steroid therapy. Follow pulmonary services as needed. Check oxygen saturations every shift. Decrease oxygen if/as able although she was using at night PTA 4. Chronic renal insufficiency. Baseline creatinine 2.5. Strict I/O.  5. Congestive heart failure. Monitor for any signs of fluid overload. Follow weights. Exam unremarkable 6. Hyperglycemia. Felt to be related to steroids. No documentation of diabetes mellitus. Continue low-dose insulin therapy. Sugars under reasonable control for the most part. 7. Hypertension/atrial fibrillation. Metoprolol 12.5 mg twice daily. Monitor the increased mobility  8. Right lower extremity hematoma/skin tear. Vaseline gauze dressing daily.  9. Chronic anemia. Transfused one unit 05/18/2010. hgb trending up 10. Clostridium difficile. Flagyl completed. Contact precautions. No diarrhea 11. Leukocytosis: steroid effect.  -on primaxin to cover pulmonary and pseudomonas in urine  -afebrile   12.  Critical illness myopathy proximal> distal weakness all 4 limbs deltoid strength improving  LOS (Days) 5 A FACE TO FACE EVALUATION WAS PERFORMED  Briele Lagasse T 05/24/2011, 8:40 AM

## 2011-05-24 NOTE — Progress Notes (Signed)
Occupational Therapy Session Note  Patient Details  Name: [name] MRN: [id number] Date of Birth: [birth year]  Today's Date: 05/24/2011 Time: 0865-7846 Time Calculation (min): 45 min   Skilled Therapeutic Interventions/Progress Updates:  Self care retraining at w/c level.  Introduced and [employer] for sit to stand, terminal sit to stand from seat on steady, difficulty with initial lift off  Working on increasing activity tolerance, standing tolerance during peri hygiene and clothing management.  Second session:  1330-1415(28min) 1:1 focus on using the Steady for transfer from w/c to toilet; sit to stand, standing tolerance/ standing balance with increase upright trunk posture for peri care and clothing management.  Sit to stand with mod to Max stand from toilet and w/c, mod A from seat on Steady. Sit to stand from Steady seat to engage table top game of dominos. Increased fatigue by end of session. Pain: left knee when resting on Steady's knee plate- relief with pillow positioned.       Therapy Documentation Precautions:  Precautions Precautions: Fall Precaution Comments: O2 Required Braces or Orthoses: No Restrictions Weight Bearing Restrictions: No Pain: Pain Assessment Pain Assessment: No/denies pain (denied pain at rest, discomfort in knees standing) Pain Score: 0-No pain Faces Pain Scale: No hurt  See FIM for current functional status    Melonie [place] 05/24/2011, 12:21 PM

## 2011-05-25 DIAGNOSIS — I509 Heart failure, unspecified: Secondary | ICD-10-CM

## 2011-05-25 DIAGNOSIS — R5381 Other malaise: Secondary | ICD-10-CM

## 2011-05-25 DIAGNOSIS — G7281 Critical illness myopathy: Secondary | ICD-10-CM

## 2011-05-25 LAB — GLUCOSE, CAPILLARY: Glucose-Capillary: 136 mg/dL — ABNORMAL HIGH (ref 70–99)

## 2011-05-25 LAB — PROTIME-INR: Prothrombin Time: 21.6 seconds — ABNORMAL HIGH (ref 11.6–15.2)

## 2011-05-25 MED ORDER — HYDROCERIN EX CREA
1.0000 "application " | TOPICAL_CREAM | Freq: Three times a day (TID) | CUTANEOUS | Status: DC
  Administered 2011-05-26 – 2011-06-01 (×17): 1 via TOPICAL
  Filled 2011-05-25 (×2): qty 113

## 2011-05-25 MED ORDER — INSULIN GLARGINE 100 UNIT/ML ~~LOC~~ SOLN
15.0000 [IU] | Freq: Every day | SUBCUTANEOUS | Status: DC
  Administered 2011-05-25 – 2011-05-31 (×7): 15 [IU] via SUBCUTANEOUS
  Filled 2011-05-25: qty 3

## 2011-05-25 MED ORDER — WARFARIN SODIUM 2 MG PO TABS
2.0000 mg | ORAL_TABLET | Freq: Once | ORAL | Status: AC
  Administered 2011-05-25: 2 mg via ORAL
  Filled 2011-05-25: qty 1

## 2011-05-25 NOTE — Progress Notes (Signed)
ANTIBIOTIC/ANTICOAGULATION CONSULT NOTE - FOLLOW UP  Pharmacy Consult for Primaxin/Coumadin Indication: PNA,UTI/afib  Allergies  Allergen Reactions  . Sulfonamide Derivatives     Childhood reaction    Patient Measurements: Height: 5\' 3"  (160 cm) Weight: 217 lb 6 oz (98.601 kg) (from [facility] records 04/19/11) IBW/kg (Calculated) : 52.4  Heparin Dosing Weight:   Vital Signs: Temp: 97.7 F (36.5 C) (02/13 0521) Temp src: Oral (02/13 0521) BP: 174/78 mmHg (02/13 0521) Pulse Rate: 69  (02/13 0521)  Labs:  Basename 05/25/11 0550 05/24/11 0608 05/23/11 0700  HGB -- 8.8* --  HCT -- 28.6* --  PLT -- 271 --  APTT -- -- --  LABPROT 21.6* 17.4* 15.0  INR 1.84* 1.40 1.16  HEPARINUNFRC -- -- --  CREATININE -- -- --  CKTOTAL -- -- --  CKMB -- -- --  TROPONINI -- -- --   Estimated Creatinine Clearance: 32.8 ml/min (by C-G formula based on Cr of 1.58).   Medications:  Scheduled:     . albuterol  2.5 mg Nebulization BID  . antiseptic oral rinse  15 mL Mouth Rinse q12n4p  . budesonide  0.5 mg Nebulization BID  . chlorhexidine  15 mL Mouth Rinse BID  . famotidine  20 mg Oral QHS  . feeding supplement  30 mL Oral BID  . guaiFENesin  5 mL Oral QID  . hydrALAZINE  50 mg Oral Q8H  . imipenem-cilastatin  250 mg Intravenous Q6H  . insulin aspart  0-15 Units Subcutaneous TID WC  . insulin glargine  15 Units Subcutaneous QHS  . ipratropium  0.5 mg Nebulization BID  . metoprolol tartrate  12.5 mg Oral BID  . mulitivitamin with minerals  1 tablet Oral Daily  . predniSONE  30 mg Oral Q breakfast  . simvastatin  20 mg Oral q1800  . traMADol  50 mg Oral Daily  . warfarin  4 mg Oral ONCE-1800  . DISCONTD: insulin glargine  10 Units Subcutaneous QHS    Assessment: 78 yo female admitted to rehab unit from [facility]. Previously hospitalized at [facility] 04/02/11 Thru 04/19/11. Patient with history of atrial fibrillation on chronic coumadin. At [facility] coumadin held for short time due  to epistaxis which resolved and coumadin resumed. She continued on coumadin per pharmacy protocol at [facility]. Last Coumadin dose at [facility] was 2mg  po on 2/3, INR 3.48 on 2/4 thus dose was held 2/4 and 2/5. On 2/6 the INR was high 4.32 thus Coumadin held. Vitamin K 10 mg SQ and Vitamin K 5mg  po given on 2/6. Today's INR = 1.84  Doses charted as given.   D#6 Imipenem, s/p 4 days of vancomycin/zosyn for PNA and pseudomonas UTI (zosyn changed to imipenem based on 2/3 urine cx revealing it is resistant to zosyn, only susc to imipenem and tobramycin).   Goal of Therapy:  INR 2-3   Plan:  1. Coumadin 2mg  tonight and continue daily INR.   [name] 05/25/2011,10:43 AM

## 2011-05-25 NOTE — Progress Notes (Signed)
Occupational Therapy Session Note  Patient Details  Name: [name] MRN: [id number] Date of Birth: [birth year]  Today's Date: 05/25/2011 1st session time 830-915 am ( ) Time: 1330-1400 Time Calculation (min): 30 min   Skilled Therapeutic Interventions/Progress Updates:   1st session:  1:1 self care retraining with emphasis on dressing sitting EOB. Pt able to sit EOB for 30 min at midline without falling over while dressing UB and washing the "hot spots." Pt able to thread underwear and pants over right LE but not her left requiring A, sit to stand using STEADY to pull self into standing, unable to let go with one UE to help pull up pants. Sit to stand 3-4 X with mod A. Slide board transfer bed to w/c with min A with verbal cues for hand and feet placement. Performed grooming at the sink mod I  2nd session: 1:1 focusing on sit to stand in the parallel bars in gym simulating grab bar option in bathroom at dtr's home, able to perform sit to stand X4 with min to mod A, able to demonstrate weight shifts picking up each of her feet - pre stand pivot transfer, even able to take some steps forward  And then backwards simulating stand step transfers (needed in bathroom).  Therapy Documentation Precautions:  Precautions Precautions: Fall Precaution Comments: O2 Required Braces or Orthoses: No Restrictions Weight Bearing Restrictions: No Pain: Pain Assessment Pain Assessment: No/denies pain Pain Score: 0-No pain in either session  See FIM for current functional status  Therapy/Group: Individual Therapy  Melonie [place] 05/25/2011, 3:39 PM

## 2011-05-25 NOTE — Progress Notes (Signed)
Per State Regulation 482.30 This chart was reviewed for medical necessity with respect to the patient's Admission/Duration of stay. Yesterday afternoon pt & pt's daughter, Ellin Mayhew [facility] phone], were given team conf report: ELOS 3/7 Min Assist goals.  Both made aware that pt's full Medicare days will end before 3/7.  Pt will go into copay days [about $300.00 per day].  Pt says she cannot afford to pay this.  Daughter says she will apply for [facility] [which pt has had before].  However, can't say that Medicaid is guaranteed.  Pt could d/c sooner, but, will require more assist.  Daughter is concerned because her [daughter's] husband will be caregiver during the day & may not be able to provide the care needed.  May need to discuss SNF to get more time & have it Medicare covered.  Will follow up w/ pt & daughter.   Brock Ra                 Nurse Care Manager              Next Review Date: 05/27/11

## 2011-05-25 NOTE — Progress Notes (Signed)
Physical Therapy Note  Patient Details  Name: [name] MRN: [id number] Date of Birth: Aug 08, 1930 Today's Date: 05/25/2011  Time: 1000-1100 60 minutes  No c/o pain. Pt eager to participate in therapy.  W/c mobility with min A for steering around obstacles, 45', 40' with increased time, no c/o L hand pain.  Sliding board transfer training bed <> w/c with max A to scoot on compliant surface, min-mod A on firm surfaces.  Gait training in lite-gait with 100#s of body weight supported with min A for wt bearing through LE's 50', 40', 50'.  Pt requires prolonged rests after gait, able to increase wt bearing on LEs with manual cues.  Pt requires cues and manual facilitation for posture, hip control and stability during stance portion of gait.  Standing posture training in lite gait with cues for knee and trunk extension.  Pt demos decreased muscular endurance, requiring manual assist to maintain upright posture when fatigued.  Individual therapy   Denny Lave 05/25/2011, 11:29 AM

## 2011-05-25 NOTE — Patient Care Conference (Signed)
Inpatient RehabilitationTeam Conference Note Date: 05/24/2011   Time: 2:47 PM    Patient Name: [name]      Medical Record Number: [id number]  Date of Birth: 12-23-1930 Sex: Female         Room/Bed: 4032/4032-01 Payor Info: Payor: MEDICARE  Plan: MEDICARE PART A AND B  Product Type: *No Product type*     Admitting Diagnosis: Deconditioned, PNA,COPD  Admit Date/Time:  05/19/2011  3:52 PM Admission Comments: No comment available   Primary Diagnosis:  COPD with acute exacerbation Principal Problem: COPD with acute exacerbation  Patient Active Problem List  Diagnoses Date Noted  . Physical deconditioning 05/20/2011  . COPD with acute exacerbation 05/20/2011  . C. difficile colitis 04/16/2011  . Atelectasis 04/07/2011  . Hyperkalemia 04/07/2011  . Diverticulitis of jejunum 04/03/2011  . Fever 04/02/2011  . Bradycardia 04/02/2011  . Venous reflux 02/14/2011  . Osteoarthritis 01/04/2011  . Fatigue 07/20/2010  . HYPERLIPIDEMIA-MIXED 03/22/2010  . HYPOTHYROIDISM 08/03/2009  . OBSTRUCTIVE SLEEP APNEA 10/07/2008  . HYPERTENSION, UNSPECIFIED 10/01/2008  . SLEEP APNEA 09/29/2008  . DIABETES MELLITUS, TYPE II 08/20/2008  . OVERWEIGHT/OBESITY 08/20/2008  . Atrial fibrillation 08/20/2008  . DIASTOLIC HEART FAILURE, CHRONIC 08/20/2008  . COPD 08/20/2008  . RENAL FAILURE, CHRONIC 08/20/2008    Expected Discharge Date: Expected Discharge Date:  (TBD--cannot afford [facility] copay days)  Team Members Present: Physician: Dr. Faith Rogue Case Manager Present: Melanee Spry, RN Social Worker Present: Amada Jupiter, LCSW Nurse Present: Carlean Purl, RN PT Present: Reggy Eye, PT OT Present: Mackie Pai, OT;Ardis Rowan, COTA;Jennifer Smith, OT SLP Present: Feliberto Gottron, SLP Other (Discipline and Name): Tora Duck, PPS Coordinator     Current Status/Progress Goal Weekly Team Focus  Medical   diverticulitis, copd, respiratory failure  maintenance of oxygenation, rate control with  activity  see above   Bowel/Bladder   Rare incontinence urine due to urgency; continent bowel  continent bowel/bladder  timed toileting   Swallow/Nutrition/ Hydration             ADL's   min A for UB, total A for LB with Steady or SARA, transfers +2 or with a lift  min A overall  activity tolerance, sit to stand in Steady, transfers   Mobility   mod A (+2 for safety)  supervision  activity tolerance, strengthening   Communication             Safety/Cognition/ Behavioral Observations            Pain   c/o lt wrist pain; buttocks,sacrum,lower back  managed 2/10  offer prn's as needed   Skin   multiple tears; weeping small blisters RLE,shin area; stage 2 at buttocks; red at folds.  resolving by d/c; no further breakdown, no s/s infection.  Aquacel to RLE; turn, boost in W/C;       *See Interdisciplinary Assessment and Plan and progress notes for long and short-term goals  Barriers to Discharge: activity tolerance    Possible Resolutions to Barriers:  endurance and stamina training    Discharge Planning/Teaching Needs:  home with daughter and son-in-law - son-in-law is retired and can provide assistance. Dtr is NP. 24/7 available.      Team Discussion: Team would like [facility] 3/7, however, pt will run out of full coverage Medicare days before that & might not be able to afford copays.   Revisions to Treatment Plan: none    Continued Need for Acute Rehabilitation Level of Care: The patient requires daily medical management  by a physician with specialized training in physical medicine and rehabilitation for the following conditions: Daily direction of a multidisciplinary physical rehabilitation program to ensure safe treatment while eliciting the highest outcome that is of practical value to the patient.: Yes Daily medical management of patient stability for increased activity during participation in an intensive rehabilitation regime.: Yes Daily analysis of laboratory values  and/or radiology reports with any subsequent need for medication adjustment of medical intervention for : Pulmonary problems;Cardiac problems;Other  Brock Ra 05/25/2011, 12:37 PM

## 2011-05-25 NOTE — Progress Notes (Signed)
Physical Therapy Note  Patient Details  Name: [name] MRN: [id number] Date of Birth: [birth year] Today's Date: 05/25/2011  Time: 1400-1454 54 minutes  No c/o pain.  Pt with increased fatigue this session, requiring more frequent, prolonged rest breaks.  Sit to stand training in parallel bars with min-mod A with pt pulling up with cues, manual assistance for wt shift forward and pushing with B LEs.  Standing wt shifts in parallel bars with pt encouraged to decrease wt on UEs and put wt on LEs.  Pt with difficulty/anxiety with increased wt bearing on LEs.  Gait training in parallel bars with min A 7' x 2 with cues for trunk extension, upright posture.  W/c mobility with min A for steering 100' with increased time, cues for pursed lip breathing.  Overall pt with improved strength and activity tolerance today.  Individual therapy   Kenji Mapel 05/25/2011, 3:32 PM

## 2011-05-25 NOTE — Plan of Care (Signed)
Problem: RH BOWEL ELIMINATION Goal: RH STG MANAGE BOWEL WITH ASSISTANCE STG Manage Bowel with Min Assistance.  Outcome: Not Progressing Max. assist  Problem: RH BLADDER ELIMINATION Goal: RH STG MANAGE BLADDER WITH ASSISTANCE STG Manage Bladder With Min Assistance  Outcome: Not Progressing Max. assist  Comments:  Pt. Continues to need 2 assist for ambulation to toilet via [place].  Verbalizes needs appropriately.

## 2011-05-25 NOTE — Progress Notes (Signed)
Patient ID: [name], female   DOB: Dec 11, 1930, 78 y.o.   MRN: [id number] Subjective/Complaints: 2/13-slept very well  Review of Systems  Respiratory: Positive for cough and shortness of breath.   All other systems reviewed and are negative.    Objective: Vital Signs: Blood pressure 174/78, pulse 69, temperature 97.7 F (36.5 C), temperature source Oral, resp. rate 20, height 5\' 3"  (1.6 m), weight 98.601 kg (217 lb 6 oz), SpO2 99.00%. No results found.  Basename 05/24/11 0608  WBC 11.3*  HGB 8.8*  HCT 28.6*  PLT 271   No results found for this basename: NA:2,K:2,CL:2,CO2:2,GLUCOSE:2,BUN:2,CREATININE:2,CALCIUM:2 in the last 72 hours CBG (last 3)   Basename 05/24/11 2241 05/24/11 2050 05/24/11 1633  GLUCAP 176* 236* 197*    Wt Readings from Last 3 Encounters:  05/19/11 98.601 kg (217 lb 6 oz)  04/15/11 98.612 kg (217 lb 6.4 oz)  01/04/11 94.856 kg (209 lb 1.9 oz)    Physical Exam:  General appearance: alert, cooperative, no distress and morbidly obese Head: Normocephalic, without obvious abnormality, atraumatic Eyes: conjunctivae/corneas clear. PERRL, EOM's intact. Fundi benign. Ears: normal TM's and external ear canals both ears Nose: Nares normal. Septum midline. Mucosa normal. No drainage or sinus tenderness. Throat: lips, mucosa, and tongue normal; teeth and gums normal Neck: no adenopathy, no carotid bruit, no JVD, supple, symmetrical, trachea midline and thyroid not enlarged, symmetric, no tenderness/mass/nodules Back: symmetric, no curvature. ROM normal. No CVA tenderness. Resp: rhonchi bilaterally comfortable with 2L oxygen Cardio: regular rate and rhythm, S1, S2 normal, no murmur, click, rub or gallop GI: soft, non-tender; bowel sounds normal; no masses,  no organomegaly Extremities: edema 1+ bilateral lower ext's trace in ue's Pulses: 2+ and symmetric Skin: Skin color, texture, turgor normal. No rashes or lesions Neurologic: moves all 4's with prox  greater than distal weakness (3-hips to 4-shoulders out of 5) no sensory isues Incision/Wound: lac on right forearm and right leg. Minimal d/c from right leg wound (1.5 inches) 2/13 exam  Assessment/Plan: 1. Functional deficits secondary to profound deconditioning after respiratory failure, diverticulitis which require 3+ hours per day of interdisciplinary therapy in a comprehensive inpatient rehab setting. Physiatrist is providing close team supervision and 24 hour management of active medical problems listed below. Physiatrist and rehab team continue to assess barriers to discharge/monitor patient progress toward functional and medical goals. FIM: FIM - Bathing Bathing Steps Patient Completed: Chest;Right Arm;Left Arm;Abdomen Bathing: 2: Max-Patient completes 3-4 39f 10 parts or 25-49%  FIM - Upper Body Dressing/Undressing Upper body dressing/undressing steps patient completed: Thread/unthread left sleeve of pullover shirt/dress;Put head through opening of pull over shirt/dress;Thread/unthread right sleeve of front closure shirt/dress;Pull shirt over trunk Upper body dressing/undressing: 5: Set-up assist to: Obtain clothing/put away FIM - Lower Body Dressing/Undressing Lower body dressing/undressing: 1: Total-Patient completed less than 25% of tasks  FIM - Toileting Toileting: 1: Two helpers  FIM - Archivist Transfers: 1-Two helpers  FIM - Banker Devices: Bed rails (slideboard) Bed/Chair Transfer: 3: Supine > Sit: Mod A (lifting assist/Pt. 50-74%/lift 2 legs;1: Two helpers  FIM - Locomotion: Wheelchair Locomotion: Wheelchair: 2: Travels 50 - 149 ft with moderate assistance (Pt: 50 - 74%) FIM - Locomotion: Ambulation Locomotion: Ambulation Assistive Devices: Lite Gait Ambulation/Gait Assistance: 1: +2 Total assist Locomotion: Ambulation: 1: Two helpers  Comprehension Comprehension Mode: Auditory Comprehension: 5-Follows  basic conversation/direction: With no assist  Expression Expression Mode: Verbal Expression: 5-Expresses basic needs/ideas: With no assist  Social Interaction Social Interaction: 6-Interacts  appropriately with others with medication or extra time (anti-anxiety, antidepressant).  Problem Solving Problem Solving: 5-Solves basic problems: With no assist  Memory Memory: 6-More than reasonable amt of time  1. Deconditioning secondary to diverticulitis/chronic obstructive pulmonary disease, respiratory failure  2. DVT Prophylaxis/Anticoagulation: Chronic Coumadin therapy for atrial fibrillation. Adjusting coumadin to therapeutic level. Watch hgb closely given supratherapeutic values 3. COPD. Chronic oxygen therapy. Continue nebulizer treatments as well as chronic steroid therapy. Follow pulmonary services as needed. Check oxygen saturations every shift. Decreaseing oxygen if/as able although she was using at night PTA 4. Chronic renal insufficiency. Baseline creatinine 2.5. Strict I/O.  5. Congestive heart failure. Monitor for any signs of fluid overload. Exam clear. Follow weights. Exam unremarkable 6. Hyperglycemia. Felt to be related to steroids. No documentation of diabetes mellitus. Adjust insulin upwards 7. Hypertension/atrial fibrillation. Metoprolol 12.5 mg twice daily. Monitor the increased mobility  8. Right lower extremity hematoma/skin tear. Use allevyn dressing 9. Chronic anemia. Transfused one unit 05/18/2010. hgb trending up 10. Clostridium difficile. Flagyl completed. Contact precautions. No diarrhea 11. Leukocytosis: steroid effect.  -on primaxin to cover pulmonary and pseudomonas in urine  -afebrile   12.  Critical illness myopathy proximal> distal weakness all 4 limbs deltoid strength improving  LOS (Days) 6 A FACE TO FACE EVALUATION WAS PERFORMED  Viola Placeres T 05/25/2011, 6:57 AM

## 2011-05-26 ENCOUNTER — Inpatient Hospital Stay (HOSPITAL_COMMUNITY): Payer: Medicare Other

## 2011-05-26 LAB — GLUCOSE, CAPILLARY
Glucose-Capillary: 74 mg/dL (ref 70–99)
Glucose-Capillary: 139 mg/dL — ABNORMAL HIGH (ref 70–99)
Glucose-Capillary: 166 mg/dL — ABNORMAL HIGH (ref 70–99)

## 2011-05-26 MED ORDER — WARFARIN SODIUM 2 MG PO TABS
2.0000 mg | ORAL_TABLET | Freq: Once | ORAL | Status: AC
  Administered 2011-05-26: 2 mg via ORAL
  Filled 2011-05-26: qty 1

## 2011-05-26 MED ORDER — FUROSEMIDE 40 MG PO TABS
40.0000 mg | ORAL_TABLET | Freq: Two times a day (BID) | ORAL | Status: DC
  Administered 2011-05-26 – 2011-05-29 (×7): 40 mg via ORAL
  Filled 2011-05-26 (×10): qty 1

## 2011-05-26 NOTE — Progress Notes (Signed)
Patient Details  Name: [name] MRN: [id number] Date of Birth: Dec 05, 1930  Today's Date: 05/26/2011 Time: 1030-11  Skilled Therapeutic Interventions/Progress Updates: Pt stood 2x at high-low table for horticultural therapy activity.  Sit-stands with Mod assist and then Min assist once up to water plants with 1 UE support working on activity tolerance and dynamic standing balance.  No c/o pain.  Therapy/Group: Other: co-treat with PT  Activity Level: Simple:  Level of assist: Mod Assist  Karra Pink 05/26/2011, 12:19 PM

## 2011-05-26 NOTE — Progress Notes (Signed)
Physical Therapy Weekly Progress Note  Patient Details  Name: ATHALIA SETTERLUND MRN: [id number] Date of Birth: Aug 28, 1930  Today's Date: 05/26/2011  Patient has met 3 of 3 short term goals.  As she has increased strength, mobility, gait and activity tolerance.  Pt is now able to gait with RW short distances and is mod A with sliding board transfers bed <> chair.  Pt is progressing toward goal of stand pivot transfers to ease burden of care on family.  Patient continues to demonstrate the following deficits: decreased activity tolerance, decreased strength, difficulty with gait, decreased functional mobility and therefore will continue to benefit from skilled PT intervention to enhance overall performance with activity tolerance, balance and ability to compensate for deficits.  Patient not progressing toward long term goals.  See goal revision..  Continue plan of care.  PT Short Term Goals Week 1:  PT Short Term Goal 1(DO NOT USE): Pt will transfer bed <> chair with max A PT Short Term Goal 1 - Progress: Met PT Short Term Goal 2 (DO NOT USE): Pt will maintain standing with LRAD x 2 minutes with mod A PT Short Term Goal 2 - Progress: Met PT Short Term Goal 3 (DO NOT USE): Pt will perform bed mobility with mod A PT Short Term Goal 3 - Progress: Met   Neftali Thurow 05/26/2011, 5:32 PM

## 2011-05-26 NOTE — Progress Notes (Signed)
Patient ID: [name], female   DOB: 1930/11/25, 78 y.o.   MRN: [id number] Patient ID: [name], female   DOB: 03-Mar-1931, 78 y.o.   MRN: [id number] Subjective/Complaints: 2/14-  Legs swelling a bit more.  Right shin wound weeping    Review of Systems  Respiratory: Positive for cough and shortness of breath.   All other systems reviewed and are negative.    Objective: Vital Signs: Blood pressure 172/75, pulse 71, temperature 98.4 F (36.9 C), temperature source Oral, resp. rate 17, height 5\' 3"  (1.6 m), weight 98.601 kg (217 lb 6 oz), SpO2 100.00%. No results found.  Basename 05/24/11 0608  WBC 11.3*  HGB 8.8*  HCT 28.6*  PLT 271   No results found for this basename: NA:2,K:2,CL:2,CO2:2,GLUCOSE:2,BUN:2,CREATININE:2,CALCIUM:2 in the last 72 hours CBG (last 3)   Basename 05/26/11 0727 05/25/11 2030 05/25/11 1613  GLUCAP 74 146* 140*    Wt Readings from Last 3 Encounters:  05/19/11 98.601 kg (217 lb 6 oz)  04/15/11 98.612 kg (217 lb 6.4 oz)  01/04/11 94.856 kg (209 lb 1.9 oz)    Physical Exam:  General appearance: alert, cooperative, no distress and morbidly obese Head: Normocephalic, without obvious abnormality, atraumatic Eyes: conjunctivae/corneas clear. PERRL, EOM's intact. Fundi benign. Ears: normal TM's and external ear canals both ears Nose: Nares normal. Septum midline. Mucosa normal. No drainage or sinus tenderness. Throat: lips, mucosa, and tongue normal; teeth and gums normal Neck: no adenopathy, no carotid bruit, no JVD, supple, symmetrical, trachea midline and thyroid not enlarged, symmetric, no tenderness/mass/nodules Back: symmetric, no curvature. ROM normal. No CVA tenderness. Resp: rhonchi bilaterally comfortable with 2L oxygen Cardio: regular rate and rhythm, S1, S2 normal, no murmur, click, rub or gallop GI: soft, non-tender; bowel sounds normal; no masses,  no organomegaly Extremities: edema 1+ bilateral lower ext's trace in ue's Pulses: 2+  and symmetric Skin: Skin color, texture, turgor normal. No rashes or lesions Neurologic: moves all 4's with prox greater than distal weakness (3-hips to 4-shoulders out of 5) no sensory isues Incision/Wound: lac on right forearm and right leg. Minimal d/c from right leg wound (1.5 inches) 2/14 exam  Assessment/Plan: 1. Functional deficits secondary to profound deconditioning after respiratory failure, diverticulitis which require 3+ hours per day of interdisciplinary therapy in a comprehensive inpatient rehab setting. Physiatrist is providing close team supervision and 24 hour management of active medical problems listed below. Physiatrist and rehab team continue to assess barriers to discharge/monitor patient progress toward functional and medical goals. FIM: FIM - Bathing Bathing Steps Patient Completed: Chest;Right Arm;Left Arm;Abdomen Bathing: 2: Max-Patient completes 3-4 51f 10 parts or 25-49%  FIM - Upper Body Dressing/Undressing Upper body dressing/undressing steps patient completed: Thread/unthread right sleeve of pullover shirt/dresss;Put head through opening of pull over shirt/dress;Thread/unthread left sleeve of pullover shirt/dress;Pull shirt over trunk Upper body dressing/undressing: 5: Set-up assist to: Obtain clothing/put away FIM - Lower Body Dressing/Undressing Lower body dressing/undressing steps patient completed: Thread/unthread right underwear leg;Thread/unthread right pants leg Lower body dressing/undressing: 2: Max-Patient completed 25-49% of tasks  FIM - Toileting Toileting: 1: Two helpers  FIM - Archivist Transfers: 1-Two helpers  FIM - Architectural technologist Transfer: 2: Chair or W/C > Bed: Max A (lift and lower assist);2: Bed > Chair or W/C: Max A (lift and lower assist)  FIM - Locomotion: Wheelchair Locomotion: Wheelchair: 1: Travels less than 50 ft with minimal assistance  (Pt.>75%) FIM - Locomotion: Ambulation Locomotion: Ambulation Assistive Devices:  Lite Gait Ambulation/Gait Assistance: 1: +2 Total assist Locomotion: Ambulation: 1: Two helpers  Comprehension Comprehension Mode: Auditory Comprehension: 6-Follows complex conversation/direction: With extra time/assistive device  Expression Expression Mode: Verbal Expression: 6-Expresses complex ideas: With extra time/assistive device  Social Interaction Social Interaction: 6-Interacts appropriately with others with medication or extra time (anti-anxiety, antidepressant).  Problem Solving Problem Solving: 5-Solves complex 90% of the time/cues < 10% of the time  Memory Memory: 6-More than reasonable amt of time  1. Deconditioning secondary to diverticulitis/chronic obstructive pulmonary disease, respiratory failure  2. DVT Prophylaxis/Anticoagulation: Chronic Coumadin therapy for atrial fibrillation. Adjusting coumadin to therapeutic level. Watch hgb closely given supratherapeutic values 3. COPD. Chronic oxygen therapy. Continue nebulizer treatments as well as chronic steroid therapy. Follow pulmonary services as needed. Check oxygen saturations every shift. Decreaseing oxygen if/as able although she was using at night PTA 4. Chronic renal insufficiency. Baseline creatinine 2.5. Strict I/O.  5. Congestive heart failure. Weights trending up.  Increase diuretics and check CXR 6. Hyperglycemia. Felt to be related to steroids. No documentation of diabetes mellitus. Adjust insulin upwards 7. Hypertension/atrial fibrillation. Metoprolol 12.5 mg twice daily. Monitor the increased mobility  8. Right lower extremity hematoma/skin tear. Use allevyn dressing 9. Chronic anemia. Transfused one unit 05/18/2010. hgb trending up 10. Clostridium difficile. Flagyl completed. Contact precautions. No diarrhea 11. Leukocytosis: steroid effect.  -dc'ed primaxin  -afebrile   12.  Critical illness myopathy proximal> distal  weakness all 4 limbs deltoid strength improving  LOS (Days) 7 A FACE TO FACE EVALUATION WAS PERFORMED  Kesa Birky T 05/26/2011, 8:55 AM

## 2011-05-26 NOTE — Progress Notes (Signed)
Occupational Therapy Weekly Progress Note  Patient Details  Name: [name] MRN: [id number] Date of Birth: [birth year]  Today's Date: 05/26/2011  Patient has met 4 of 4 short term goals.  Pt continues to make good progress with ADL performance. Pt can get to EOB with min to mod A and is able to maintain static sitting balance without UE support at EOB. Dynamic sitting balance requires one UE support.  Currently secondary to fatigue and SOB with activity nursing has been assisting with baths at night. Pt can dress UB with setup, dress LB with max A. Pt can now perform sit to stand with mod A for LB clothing management with bilateral UE support. Toileting is total A with the STEADY currently and her basic transfers are mod A- max A depending on fatigue. Pt continues to make steady progress.  Patient continues to demonstrate the following deficits: muscle weakness, decreased cardiorespiratoy endurance and decreased oxygen support, unbalanced muscle activation and decreased sitting balance, decreased standing balance, decreased postural control and decreased balance strategies and activity tolerance requiring a lot of rest breaks and therefore will continue to benefit from skilled OT intervention to enhance overall performance with BADL.  Patient progressing toward long term goals..  Continue plan of care.  NEW OT Short Term Goals   OT Short Term Goal 1 (Week 2): Pt would transfer to toilet/ BSC with LRAD with min A OT Short Term Goal 2 (Week 2): Pt would perform sit to stand with min A for LB clothing managment OT Short Term Goal 3 (Week 2): Pt would dress LB with min A OT Short Term Goal 4 (Week 2): Pt would transfer into tub with mod A using tub bench OT Short Term Goal 5 (Week 2): Pt would bathe 10/10 parts with AE with min A  Skilled Therapeutic Interventions/Progress Updates:  Cognitive remediation/compensation;DME/adaptive equipment instruction;Ambulation/gait training;Functional  mobility training;Self Care/advanced ADL retraining;Patient/family education;Therapeutic Activities;UE/LE Coordination activities;Therapeutic Exercise;Wheelchair propulsion/positioning;UE/LE Strength taining/ROM;Neuromuscular re-education;Community reintegration;Balance/vestibular training   Therapy Documentation Precautions:  Precautions Precautions: Fall Precaution Comments: O2 Required Braces or Orthoses: No Restrictions Weight Bearing Restrictions: No See FIM for current functional status  Therapy/Group: Individual Therapy  Melonie Florida 05/26/2011, 12:13 PM

## 2011-05-26 NOTE — Progress Notes (Signed)
Physical Therapy Note  Patient Details  Name: [name] MRN: [id number] Date of Birth: 1930/05/19 Today's Date: 05/26/2011  1630-1655 (25 minutes) individual Pain- no complaint of pain Focus of treatment: Gait training/endurance with RW monitoring Oxygen Sats Treatment: sit to stand min/mod assist X 2; Gait 15 feet X 2 min/mod assist with RW; Oxygen Sats at rest = 94 % 3LNC pulse 84; Oxygen Sats post gait= 92% pulse 109   Dalyn Kjos,JIM 05/26/2011, 4:57 PM

## 2011-05-26 NOTE — Progress Notes (Signed)
Occupational Therapy Session Note  Patient Details  Name: [name] MRN: [id number] Date of Birth: 1931/01/13  Today's Date: 05/26/2011 Time: 6213-0865 Time Calculation (min): 30 min  Short Term Goals: Week 2:  OT Short Term Goal 1 (Week 2): Pt would transfer to toilet/ BSC with LRAD with min A OT Short Term Goal 2 (Week 2): Pt would perform sit to stand with min A for LB clothing managment OT Short Term Goal 3 (Week 2): Pt would dress LB with min A OT Short Term Goal 4 (Week 2): Pt would transfer into tub with mod A using tub bench OT Short Term Goal 5 (Week 2): Pt would bathe 10/10 parts with AE with min A  Skilled Therapeutic Interventions/Progress Updates:  1:1 focused on functional ambulation into bathroom from doorway with RW with min - mod A with verbal cues for breathing technique. Transfer into bath tub with tubbench with mod A (needed A to lift both LEs. Returned to w/c in hallway with mod A for sit to stand with RW. Functional ambulation with RW in hallway with mod A for sit to stand and encouragement for pursed lip breathing (went from ADL apt door to almost gym door)  Therapy Documentation Precautions:  Precautions Precautions: Fall Precaution Comments: O2 Required Braces or Orthoses: No Restrictions Weight Bearing Restrictions: No Pain:  no c/o  See FIM for current functional status  Therapy/Group: Individual Therapy  Melonie Florida 05/26/2011, 3:23 PM

## 2011-05-26 NOTE — Progress Notes (Signed)
Physical Therapy Note  Patient Details  Name: [name] MRN: [id number] Date of Birth: [birth year] Today's Date: 05/26/2011  Time: 1000-1100 60 minutes  No c/o pain. Pt c/o feeling "jittery" today, RN aware.  W/c mobility 40' with increased time, min A, decreased tolerance to w/c mobility today due to increasing L hand pain with propulsion.  Gait training in parallel bars with +2 assist to stand, min A for gait 5' x 2.  Multiple reps of sit to stand with +2 assist today due to fatigue, manual facilitation for forward wt shift  And wt bearing through LEs.  Gait with RW 3 x 15' with +2 assist for safety, pt 85%. Pt with improved strength and balance noted today, improved activity tolerance.  Pt continues to require frequent prolonged rest breaks between all activities.  Standing balance/tolerance with plant watering activity with 1 UE support with min A for balance, mod A to stand due to decreased fatigue after prolonged rest.  Pt improving today, continues to require "pulling up" to stand vs pushing up from chair, will continue to focus on this to increase independence with mobility.  Individual therapy   Adrienna Karis 05/26/2011, 1:12 PM

## 2011-05-26 NOTE — Progress Notes (Signed)
Occupational Therapy Session Note  Patient Details  Name: TEMPIE GIBEAULT MRN: [id number] Date of Birth: [birth year]  Today's Date: 05/26/2011 Time:  - 830-915 ( )     Skilled Therapeutic Interventions/Progress Updates:    1:1 self care retraining with emphasis on dressing EOB. Upon arrival pt on toilet with STEADY in front of her. Min A to get up off toilet and sitting on seat on STEADY. Transitioned to bed. Educated on using a reacher to increase success of threading pants, still needed assistance with left LE.  Sit to stand with mod A without use of Steady and pt able to pull up underwear with steady A and was able to help with pulling up her pants.  Stand pivot ("dancing")  with mod A with extra time over to the chair. Went down to tub room to look at tub bench and bathroom setup to try this pm.  Therapy Documentation Precautions:  Precautions Precautions: Fall Precaution Comments: O2 Required Braces or Orthoses: No Restrictions Weight Bearing Restrictions: No Pain:  no c/o  See FIM for current functional status  Therapy/Group: Individual Therapy  Melonie Florida 05/26/2011, 9:04 AM

## 2011-05-26 NOTE — Progress Notes (Signed)
ANTICOAGULATION CONSULT NOTE - FOLLOW UP  Pharmacy Consult for Coumadin Indication: afib  Allergies  Allergen Reactions  . Sulfonamide Derivatives     Childhood reaction    Patient Measurements: Height: 5\' 3"  (160 cm) Weight: 217 lb 6 oz (98.601 kg) (from [facility] 04/19/11) IBW/kg (Calculated) : 52.4     Vital Signs: Temp: 98.4 F (36.9 C) (02/14 0551) Temp src: Oral (02/14 0551) BP: 172/75 mmHg (02/14 0551) Pulse Rate: 71  (02/14 0551)  Labs:  Basename 05/26/11 0710 05/25/11 0550 05/24/11 0608  HGB -- -- 8.8*  HCT -- -- 28.6*  PLT -- -- 271  APTT -- -- --  LABPROT 24.9* 21.6* 17.4*  INR 2.21* 1.84* 1.40  HEPARINUNFRC -- -- --  CREATININE -- -- --  CKTOTAL -- -- --  CKMB -- -- --  TROPONINI -- -- --   Estimated Creatinine Clearance: 32.8 ml/min (by C-G formula based on Cr of 1.58).  Assessment: INR therapeutic today. No bleeding noted.   Goal of Therapy:  INR 2-3   Plan:  1. Coumadin 2mg  tonight and continue daily INR.   Lavonia Dana, PharmD, BCPS Clinical pharmacist, pager [phone] 05/26/2011,12:58 PM

## 2011-05-27 DIAGNOSIS — I509 Heart failure, unspecified: Secondary | ICD-10-CM

## 2011-05-27 DIAGNOSIS — Z5189 Encounter for other specified aftercare: Secondary | ICD-10-CM

## 2011-05-27 DIAGNOSIS — R5381 Other malaise: Secondary | ICD-10-CM

## 2011-05-27 DIAGNOSIS — G7281 Critical illness myopathy: Secondary | ICD-10-CM

## 2011-05-27 LAB — BASIC METABOLIC PANEL
BUN: 56 mg/dL — ABNORMAL HIGH (ref 6–23)
CO2: 31 mEq/L (ref 19–32)
Calcium: 9.4 mg/dL (ref 8.4–10.5)
Creatinine, Ser: 1.45 mg/dL — ABNORMAL HIGH (ref 0.50–1.10)
GFR calc non Af Amer: 34 mL/min — ABNORMAL LOW (ref >90)
Glucose, Bld: 84 mg/dL (ref 70–99)

## 2011-05-27 LAB — PROTIME-INR
INR: 2.38 — ABNORMAL HIGH (ref 0.00–1.49)
Prothrombin Time: 26.4 s — ABNORMAL HIGH (ref 11.6–15.2)

## 2011-05-27 LAB — CBC
HCT: 30.1 % — ABNORMAL LOW (ref 36.0–46.0)
Hemoglobin: 9 g/dL — ABNORMAL LOW (ref 12.0–15.0)
MCH: 27.4 pg (ref 26.0–34.0)
MCHC: 29.9 g/dL — ABNORMAL LOW (ref 30.0–36.0)
MCV: 91.8 fL (ref 78.0–100.0)
Platelets: 269 10*3/uL (ref 150–400)
RBC: 3.28 MIL/uL — ABNORMAL LOW (ref 3.87–5.11)
RDW: 20 % — ABNORMAL HIGH (ref 11.5–15.5)
WBC: 9.6 10*3/uL (ref 4.0–10.5)

## 2011-05-27 LAB — GLUCOSE, CAPILLARY
Glucose-Capillary: 94 mg/dL (ref 70–99)
Glucose-Capillary: 249 mg/dL — ABNORMAL HIGH (ref 70–99)
Glucose-Capillary: 159 mg/dL — ABNORMAL HIGH (ref 70–99)

## 2011-05-27 MED ORDER — TRAMADOL HCL 50 MG PO TABS
50.0000 mg | ORAL_TABLET | Freq: Four times a day (QID) | ORAL | Status: DC | PRN
  Administered 2011-05-27: 50 mg via ORAL
  Filled 2011-05-27 (×2): qty 1

## 2011-05-27 MED ORDER — WARFARIN SODIUM 2 MG PO TABS
2.0000 mg | ORAL_TABLET | Freq: Once | ORAL | Status: AC
  Administered 2011-05-27: 2 mg via ORAL
  Filled 2011-05-27: qty 1

## 2011-05-27 NOTE — Progress Notes (Signed)
Occupational Therapy Session Note  Patient Details  Name: [name] MRN: [id number] Date of Birth: [birth year]  Today's Date: 05/27/2011 Time: 4098-1191 Time Calculation (min): 70 min  Short Term Goals: Week 2:  OT Short Term Goal 1 (Week 2): Pt would transfer to toilet/ BSC with LRAD with min A OT Short Term Goal 2 (Week 2): Pt would perform sit to stand with min A for LB clothing managment OT Short Term Goal 3 (Week 2): Pt would dress LB with min A OT Short Term Goal 4 (Week 2): Pt would transfer into tub with mod A using tub bench OT Short Term Goal 5 (Week 2): Pt would bathe 10/10 parts with AE with min A  Skilled Therapeutic Interventions/Progress Updates:    1:1 self care retraining: combining bathing and dressing today allowing ample time for rest breaks throughout session. Bathed in tub room, using tub bench for transfer into tub with mod A for getting in and out of tub (lifting A for bilateral LE).  Also focus on sit to stand, standing balance with only one UE support to allow her other hand to assist with clothing management, functional ambulation with RW in bathroom and in hallway with verbal cues for pursed lip breathing.   Therapy Documentation Precautions:  Precautions Precautions: Fall Precaution Comments: O2 Required Braces or Orthoses: No Restrictions Weight Bearing Restrictions: No Pain:   no c/o  See FIM for current functional status  Therapy/Group: Individual Therapy  Melonie Florida 05/27/2011, 9:42 AM

## 2011-05-27 NOTE — Progress Notes (Signed)
Occupational Therapy Session Note  Patient Details  Name: TINSLEY EVERMAN MRN: [id number] Date of Birth: November 14, 1930  Today's Date: 05/27/2011 Time: 1100-1126 Time Calculation (min): 26 min  Short Term Goals: Week 2:  OT Short Term Goal 1 (Week 2): Pt would transfer to toilet/ BSC with LRAD with min A OT Short Term Goal 2 (Week 2): Pt would perform sit to stand with min A for LB clothing managment OT Short Term Goal 3 (Week 2): Pt would dress LB with min A OT Short Term Goal 4 (Week 2): Pt would transfer into tub with mod A using tub bench OT Short Term Goal 5 (Week 2): Pt would bathe 10/10 parts with AE with min A  Skilled Therapeutic Interventions/Progress Updates:    Pt engaged in sit to stand from w/c with max A X 4.  Attempted to complete standing activity using BUE to fold towels.  Pt unable to stand without BUE support.  O2 sats >90% on 2L O2.  Therapy Documentation Precautions:  Precautions Precautions: Fall Precaution Comments: O2 Required Braces or Orthoses: No Restrictions Weight Bearing Restrictions: No   Pain: Pain Assessment Pain Assessment: No/denies pain Pain Score: 0-No pain  See FIM for current functional status  Therapy/Group: Individual Therapy  Rich Brave 05/27/2011, 11:27 AM

## 2011-05-27 NOTE — Progress Notes (Signed)
Nutrition Follow-up  Pt continues to eat well; eating >90% of meals.  Diet Order:  CHO Modified Medium Supplement: Prostat PO BID  Meds: Scheduled Meds:   . albuterol  2.5 mg Nebulization BID  . antiseptic oral rinse  15 mL Mouth Rinse q12n4p  . budesonide  0.5 mg Nebulization BID  . chlorhexidine  15 mL Mouth Rinse BID  . famotidine  20 mg Oral QHS  . feeding supplement  30 mL Oral BID  . furosemide  40 mg Oral BID  . guaiFENesin  5 mL Oral QID  . hydrALAZINE  50 mg Oral Q8H  . hydrocerin  1 application Topical TID  . insulin aspart  0-15 Units Subcutaneous TID WC  . insulin glargine  15 Units Subcutaneous QHS  . ipratropium  0.5 mg Nebulization BID  . metoprolol tartrate  12.5 mg Oral BID  . mulitivitamin with minerals  1 tablet Oral Daily  . predniSONE  30 mg Oral Q breakfast  . simvastatin  20 mg Oral q1800  . traMADol  50 mg Oral Daily  . warfarin  2 mg Oral ONCE-1800  . warfarin  2 mg Oral ONCE-1800   Continuous Infusions:  PRN Meds:.acetaminophen, albuterol, alum & mag hydroxide-simeth, bisacodyl, diphenhydrAMINE, methocarbamol, ondansetron, oxyCODONE, polyethylene glycol, traZODone  Labs:  CMP     Component Value Date/Time   NA 144 05/27/2011 0655   K 3.6 05/27/2011 0655   CL 102 05/27/2011 0655   CO2 31 05/27/2011 0655   GLUCOSE 84 05/27/2011 0655   BUN 56* 05/27/2011 0655   CREATININE 1.45* 05/27/2011 0655   CREATININE 2.65* 06/18/2010 1715   CALCIUM 9.4 05/27/2011 0655   PROT 6.1 05/20/2011 0650   ALBUMIN 2.2* 05/20/2011 0650   AST 8 05/20/2011 0650   ALT 10 05/20/2011 0650   ALKPHOS 58 05/20/2011 0650   BILITOT 0.5 05/20/2011 0650   GFRNONAA 34* 05/27/2011 0655   GFRAA 39* 05/27/2011 0655     Intake/Output Summary (Last 24 hours) at 05/27/11 1027 Last data filed at 05/27/11 0430  Gross per 24 hour  Intake    360 ml  Output    275 ml  Net     85 ml    Weight Status:  98.6 kg on 2/7; wt stable  Estimated needs:  1700 - 1900 kcal, 75 - 85 grams protein  Nutrition  Dx:  Increased energy expenditure - persist.  Goal:  Pt to consume >/= 75% of meals and supplements. Met.  Intervention: Continue current interventions; pt eating well at this time.  Monitor:  Weights, labs, PO intake, I/O's  Adair Laundry Pager #:  [phone]

## 2011-05-27 NOTE — Progress Notes (Signed)
ANTICOAGULATION CONSULT NOTE - FOLLOW UP  Pharmacy Consult for Coumadin Indication: afib  Allergies  Allergen Reactions  . Sulfonamide Derivatives     Childhood reaction    Patient Measurements: Height: 5\' 3"  (160 cm) Weight: 217 lb 6 oz (98.601 kg) (from [facility] 04/19/11) IBW/kg (Calculated) : 52.4     Vital Signs: Temp: 98.4 F (36.9 C) (02/15 0500) Temp src: Oral (02/15 0500) BP: 149/71 mmHg (02/15 0500) Pulse Rate: 73  (02/15 0500)  Labs:  Basename 05/27/11 0655 05/26/11 0710 05/25/11 0550  HGB 9.0* -- --  HCT 30.1* -- --  PLT 269 -- --  APTT -- -- --  LABPROT 26.4* 24.9* 21.6*  INR 2.38* 2.21* 1.84*  HEPARINUNFRC -- -- --  CREATININE 1.45* -- --  CKTOTAL -- -- --  CKMB -- -- --  TROPONINI -- -- --   Estimated Creatinine Clearance: 35.8 ml/min (by C-G formula based on Cr of 1.45).  Assessment: INR therapeutic today. No bleeding noted.   Goal of Therapy:  INR 2-3   Plan:  1. Coumadin 2mg  tonight and continue daily INR.   Woodfin Ganja, PharmD Clinical pharmacist, pager [phone] 05/27/2011,10:00 AM

## 2011-05-27 NOTE — Progress Notes (Signed)
Per State Regulation 482.30 This chart was reviewed for medical necessity with respect to the patient's Admission/Duration of stay. MD increased Lasix d/t pt's weight up some.  Pt's last fully covered Medicare day will be 06/01/11--daughter is to do a Medicaid application for pt today.  If pt cannot count on Medicaid, she will need to consider SNF, since she does have 20 days of Medicare SNF coverage.   Have spoken w/ pt about this, & left a vm for pt's daughter.     Brock Ra                 Nurse Care Manager              Next Review Date: 05/30/11

## 2011-05-27 NOTE — Progress Notes (Signed)
Physical Therapy Note  Patient Details  Name: [name] MRN: [id number] Date of Birth: 21-Feb-1931 Today's Date: 05/27/2011  Time: 1300-1330 30 minutes  Pt with no c/o pain.  Treatment session focused on functional transfers.  Pt performed multiple stand pivot transfers min-mod A with RW from bed <>wc.  Pt requires increased assist when fatigued.  Side stepping with RW with supervision for practice with household gait.  Bed mobility with supervision, increased time.  Pt with min A to scoot laterally and a/p in bed.  Individual therapy   Severus Brodzinski 05/27/2011, 3:21 PM

## 2011-05-27 NOTE — Progress Notes (Signed)
Physical Therapy Note  Patient Details  Name: [name] MRN: [id number] Date of Birth: 11/09/1930 Today's Date: 05/27/2011  Time: 1000-1055 55 minutes  No c/o pain.  W/c mobility 2 x 60' with supervision, increased time for obstacle negotiation.  Gait training with RW with min A 3 x 30' with slow cadence, cues for pursed lip breathing. Pt with improved activity tolerance today, requiring shorter rest breaks between sets.  SPT with RW mod A multiple attempts.  Pt with improved RW mobility today.  Multiple reps sit to stand from various height surfaces, pt supervision from high surfaces, progressing to mod A as height of surface decreases.  Overall pt with improved endurance, gait and mobility today.  Individual therapy   Heaven Wandell 05/27/2011, 10:56 AM

## 2011-05-27 NOTE — Progress Notes (Signed)
Subjective/Complaints: 2/15- no new complaints.  Voided a lot yesterday.    Review of Systems  Respiratory: Positive for cough and shortness of breath.   All other systems reviewed and are negative.    Objective: Vital Signs: Blood pressure 149/71, pulse 73, temperature 98.4 F (36.9 C), temperature source Oral, resp. rate 18, height 5\' 3"  (1.6 m), weight 98.601 kg (217 lb 6 oz), SpO2 100.00%. Dg Chest 2 View  05/26/2011  *RADIOLOGY REPORT*  Clinical Data: Congestion for 2 months  CHEST - 2 VIEW  Comparison: 05/15/2011  Findings: Enlargement of cardiac silhouette with pulmonary vascular congestion. Bibasilar effusions and atelectasis. Unable to exclude underlying infiltrates at the lower lobes. Upper lungs clear. Underlying emphysematous changes suspected. End plate spur formation thoracic spine.  IMPRESSION: Enlargement of cardiac silhouette with pulmonary vascular congestion. Bibasilar effusions and atelectasis; unable to exclude underlying infiltrates in the lower lobes.  Original Report Authenticated By: Lollie Marrow, M.D.   No results found for this basename: WBC:2,HGB:2,HCT:2,PLT:2 in the last 72 hours No results found for this basename: NA:2,K:2,CL:2,CO2:2,GLUCOSE:2,BUN:2,CREATININE:2,CALCIUM:2 in the last 72 hours CBG (last 3)   Basename 05/26/11 2130 05/26/11 1609 05/26/11 1327  GLUCAP 231* 166* 139*    Wt Readings from Last 3 Encounters:  05/19/11 98.601 kg (217 lb 6 oz)  04/15/11 98.612 kg (217 lb 6.4 oz)  01/04/11 94.856 kg (209 lb 1.9 oz)    Physical Exam:  General appearance: alert, cooperative, no distress and morbidly obese Head: Normocephalic, without obvious abnormality, atraumatic Eyes: conjunctivae/corneas clear. PERRL, EOM's intact. Fundi benign. Ears: normal TM's and external ear canals both ears Nose: Nares normal. Septum midline. Mucosa normal. No drainage or sinus tenderness. Throat: lips, mucosa, and tongue normal; teeth and gums normal Neck: no  adenopathy, no carotid bruit, no JVD, supple, symmetrical, trachea midline and thyroid not enlarged, symmetric, no tenderness/mass/nodules Back: symmetric, no curvature. ROM normal. No CVA tenderness. Resp: rhonchi bilaterally comfortable with 2L oxygen. A few crackles at bases Cardio: regular rate and rhythm, S1, S2 normal, no murmur, click, rub or gallop GI: soft, non-tender; bowel sounds normal; no masses,  no organomegaly Extremities: edema 1+ bilateral lower ext's trace in ue's Pulses: 2+ and symmetric Skin: Skin color, texture, turgor normal. No rashes or lesions Neurologic: moves all 4's with prox greater than distal weakness (3-hips to 4-shoulders out of 5) no sensory isues Incision/Wound: lac on right forearm and right leg. Some discharge from right leg wound. 2/15 exam  Assessment/Plan: 1. Functional deficits secondary to profound deconditioning after respiratory failure, diverticulitis which require 3+ hours per day of interdisciplinary therapy in a comprehensive inpatient rehab setting. Physiatrist is providing close team supervision and 24 hour management of active medical problems listed below. Physiatrist and rehab team continue to assess barriers to discharge/monitor patient progress toward functional and medical goals. FIM: FIM - Bathing Bathing Steps Patient Completed: Chest;Right Arm;Left Arm;Abdomen Bathing: 2: Max-Patient completes 3-4 20f 10 parts or 25-49%  FIM - Upper Body Dressing/Undressing Upper body dressing/undressing steps patient completed: Thread/unthread right sleeve of pullover shirt/dresss;Put head through opening of pull over shirt/dress;Thread/unthread left sleeve of pullover shirt/dress;Pull shirt over trunk Upper body dressing/undressing: 5: Set-up assist to: Obtain clothing/put away FIM - Lower Body Dressing/Undressing Lower body dressing/undressing steps patient completed: Thread/unthread right underwear leg;Pull underwear up/down;Thread/unthread right  pants leg Lower body dressing/undressing: 2: Max-Patient completed 25-49% of tasks  FIM - Toileting Toileting: 1: Total-Patient completed zero steps, helper did all 3  FIM - Archivist Transfers: 1-Two helpers  FIM - Banker Devices: Sliding board Bed/Chair Transfer: 1: Mechanical lift  FIM - Locomotion: Wheelchair Locomotion: Wheelchair: 1: Total Assistance/staff pushes wheelchair (Pt<25%) FIM - Locomotion: Ambulation Locomotion: Ambulation Assistive Devices: Designer, industrial/product Ambulation/Gait Assistance: 1: +2 Total assist Locomotion: Ambulation: 0: Activity did not occur  Comprehension Comprehension Mode: Auditory Comprehension: 6-Follows complex conversation/direction: With extra time/assistive device  Expression Expression Mode: Verbal Expression: 6-Expresses complex ideas: With extra time/assistive device  Social Interaction Social Interaction: 6-Interacts appropriately with others with medication or extra time (anti-anxiety, antidepressant).  Problem Solving Problem Solving: 5-Solves complex 90% of the time/cues < 10% of the time  Memory Memory: 6-More than reasonable amt of time  1. Deconditioning secondary to diverticulitis/chronic obstructive pulmonary disease, respiratory failure  2. DVT Prophylaxis/Anticoagulation: Chronic Coumadin therapy for atrial fibrillation. Adjusting coumadin to therapeutic level. Watch hgb closely given supratherapeutic values 3. COPD. Chronic oxygen therapy. Continue nebulizer treatments as well as chronic steroid therapy. Follow pulmonary services as needed. Check oxygen saturations every shift. Decreasing oxygen if/as able although she was using at night PTA 4. Chronic renal insufficiency. Baseline creatinine 2.5. Strict I/O.  5. Congestive heart failure. Weights trending up.  Increased diuretics.   -need to check weights daily  -bmet today  -cxr doesn't appear to have any acute  findings. 6. Hyperglycemia. Felt to be related to steroids. No documentation of diabetes mellitus. Adjust insulin upwards 7. Hypertension/atrial fibrillation. Metoprolol 12.5 mg twice daily. Monitor the increased mobility  8. Right lower extremity hematoma/skin tear. Use gauze to absorb any drainage. 9. Chronic anemia. Transfused one unit 05/18/2010. hgb trending up 10. Clostridium difficile. Flagyl completed. Contact precautions. No diarrhea 11. Leukocytosis: steroid effect.  -dc'ed primaxin  -afebrile   12.  Critical illness myopathy proximal> distal weakness all 4 limbs deltoid strength improving  LOS (Days) 8 A FACE TO FACE EVALUATION WAS PERFORMED  [name] T 05/27/2011, 6:37 AM

## 2011-05-28 LAB — GLUCOSE, CAPILLARY
Glucose-Capillary: 70 mg/dL (ref 70–99)
Glucose-Capillary: 203 mg/dL — ABNORMAL HIGH (ref 70–99)

## 2011-05-28 LAB — PROTIME-INR: INR: 2.5 — ABNORMAL HIGH (ref 0.00–1.49)

## 2011-05-28 MED ORDER — WARFARIN SODIUM 2 MG PO TABS
2.0000 mg | ORAL_TABLET | Freq: Once | ORAL | Status: AC
  Administered 2011-05-28: 2 mg via ORAL
  Filled 2011-05-28: qty 1

## 2011-05-28 NOTE — Progress Notes (Signed)
ANTICOAGULATION CONSULT NOTE - FOLLOW UP  Pharmacy Consult for Coumadin Indication: afib  Allergies  Allergen Reactions  . Sulfonamide Derivatives     Childhood reaction    Patient Measurements: Height: 5\' 3"  (160 cm) Weight: 217 lb (98.431 kg) IBW/kg (Calculated) : 52.4     Vital Signs: Temp: 97 F (36.1 C) (02/16 0637) Temp src: Oral (02/16 0637) BP: 133/72 mmHg (02/16 0637) Pulse Rate: 59  (02/16 0637)  Labs:  Basename 05/28/11 0645 05/27/11 0655 05/26/11 0710  HGB -- 9.0* --  HCT -- 30.1* --  PLT -- 269 --  APTT -- -- --  LABPROT 27.4* 26.4* 24.9*  INR 2.50* 2.38* 2.21*  HEPARINUNFRC -- -- --  CREATININE -- 1.45* --  CKTOTAL -- -- --  CKMB -- -- --  TROPONINI -- -- --   Estimated Creatinine Clearance: 35.7 ml/min (by C-G formula based on Cr of 1.45).  Assessment: INR therapeutic today. No bleeding noted.   Goal of Therapy:  INR 2-3   Plan:  1. Coumadin 2mg  tonight and continue daily INR.   Woodfin Ganja, PharmD Clinical pharmacist, pager [phone] 05/28/2011,1:14 PM

## 2011-05-28 NOTE — Progress Notes (Signed)
Patient ID: [name], female   DOB: Dec 15, 1930, 78 y.o.   MRN: [id number]  Subjective/Complaints: 2/16---feeling better. "legs are smaller"   Review of Systems  Respiratory: Positive for cough and shortness of breath.   All other systems reviewed and are negative.    Objective: Vital Signs: Blood pressure 133/72, pulse 59, temperature 97 F (36.1 C), temperature source Oral, resp. rate 18, height 5\' 3"  (1.6 m), weight 98.431 kg (217 lb), SpO2 95.00%. Dg Chest 2 View  05/26/2011  *RADIOLOGY REPORT*  Clinical Data: Congestion for 2 months  CHEST - 2 VIEW  Comparison: 05/15/2011  Findings: Enlargement of cardiac silhouette with pulmonary vascular congestion. Bibasilar effusions and atelectasis. Unable to exclude underlying infiltrates at the lower lobes. Upper lungs clear. Underlying emphysematous changes suspected. End plate spur formation thoracic spine.  IMPRESSION: Enlargement of cardiac silhouette with pulmonary vascular congestion. Bibasilar effusions and atelectasis; unable to exclude underlying infiltrates in the lower lobes.  Original Report Authenticated By: Lollie Marrow, M.D.    Basename 05/27/11 0655  WBC 9.6  HGB 9.0*  HCT 30.1*  PLT 269    Basename 05/27/11 0655  NA 144  K 3.6  CL 102  CO2 31  GLUCOSE 84  BUN 56*  CREATININE 1.45*  CALCIUM 9.4   CBG (last 3)   Basename 05/27/11 2132 05/27/11 1731 05/27/11 1143  GLUCAP 159* 249* 94    Wt Readings from Last 3 Encounters:  05/28/11 98.431 kg (217 lb)  04/15/11 98.612 kg (217 lb 6.4 oz)  01/04/11 94.856 kg (209 lb 1.9 oz)    Physical Exam:  General appearance: alert, cooperative, no distress and morbidly obese Head: Normocephalic, without obvious abnormality, atraumatic Eyes: conjunctivae/corneas clear. PERRL, EOM's intact. Fundi benign. Ears: normal TM's and external ear canals both ears Nose: Nares normal. Septum midline. Mucosa normal. No drainage or sinus tenderness. Throat: lips, mucosa, and  tongue normal; teeth and gums normal Neck: no adenopathy, no carotid bruit, no JVD, supple, symmetrical, trachea midline and thyroid not enlarged, symmetric, no tenderness/mass/nodules Back: symmetric, no curvature. ROM normal. No CVA tenderness. Resp: rhonchi bilaterally comfortable with 2L oxygen. A few crackles at bases Cardio: regular rate and rhythm, S1, S2 normal, no murmur, click, rub or gallop GI: soft, non-tender; bowel sounds normal; no masses,  no organomegaly Extremities: edema tr to 1+ bilateral lower ext's Pulses: 2+ and symmetric Skin: Skin color, texture, turgor normal. No rashes or lesions Neurologic: moves all 4's with prox greater than distal weakness (3-hips to 4-shoulders out of 5) no sensory isues. Good sitting posture Incision/Wound: lac on right forearm and right leg. Some discharge from right leg wound. 2/16 exam  Assessment/Plan: 1. Functional deficits secondary to profound deconditioning after respiratory failure, diverticulitis which require 3+ hours per day of interdisciplinary therapy in a comprehensive inpatient rehab setting. Physiatrist is providing close team supervision and 24 hour management of active medical problems listed below. Physiatrist and rehab team continue to assess barriers to discharge/monitor patient progress toward functional and medical goals. FIM: FIM - Bathing Bathing Steps Patient Completed: Chest;Right Arm;Left Arm;Abdomen;Front perineal area;Right upper leg;Left upper leg;Right lower leg (including foot);Left lower leg (including foot) Bathing: 4: Steadying assist  FIM - Upper Body Dressing/Undressing Upper body dressing/undressing steps patient completed: Thread/unthread right sleeve of pullover shirt/dresss;Put head through opening of pull over shirt/dress;Thread/unthread left sleeve of pullover shirt/dress;Pull shirt over trunk Upper body dressing/undressing: 5: Set-up assist to: Obtain clothing/put away FIM - Lower Body  Dressing/Undressing Lower body dressing/undressing steps patient  completed: Thread/unthread right underwear leg;Thread/unthread left underwear leg;Pull underwear up/down;Thread/unthread left pants leg;Thread/unthread right pants leg;Don/Doff right shoe;Don/Doff left shoe Lower body dressing/undressing: 4: Min-Patient completed 75 plus % of tasks  FIM - Toileting Toileting: 1: Total-Patient completed zero steps, helper did all 3  FIM - Archivist Transfers: 1-Two helpers  FIM - Architectural technologist Transfer: 3: Bed > Chair or W/C: Mod A (lift or lower assist);3: Chair or W/C > Bed: Mod A (lift or lower assist)  FIM - Locomotion: Wheelchair Locomotion: Wheelchair: 2: Travels 50 - 149 ft with supervision, cueing or coaxing FIM - Locomotion: Ambulation Locomotion: Ambulation Assistive Devices: Designer, industrial/product Ambulation/Gait Assistance: 1: +2 Total assist Locomotion: Ambulation: 1: Travels less than 50 ft with minimal assistance (Pt.>75%)  Comprehension Comprehension Mode: Auditory Comprehension: 5-Understands complex 90% of the time/Cues < 10% of the time  Expression Expression Mode: Verbal Expression: 5-Expresses complex 90% of the time/cues < 10% of the time  Social Interaction Social Interaction: 6-Interacts appropriately with others with medication or extra time (anti-anxiety, antidepressant).  Problem Solving Problem Solving: 5-Solves complex 90% of the time/cues < 10% of the time  Memory Memory: 6-More than reasonable amt of time  1. Deconditioning secondary to diverticulitis/chronic obstructive pulmonary disease, respiratory failure  2. DVT Prophylaxis/Anticoagulation: Chronic Coumadin therapy for atrial fibrillation. Adjusting coumadin to therapeutic level. Watch hgb closely given supratherapeutic values 3. COPD. Chronic oxygen therapy. Continue nebulizer treatments as well as chronic steroid  therapy. Follow pulmonary services as needed. Check oxygen saturations every shift. Decreasing oxygen if/as able although she was using at night PTA 4. Chronic renal insufficiency. Baseline creatinine 2.5. Strict I/O.  5. Congestive heart failure.  Increased diuretics. Legs noticeably less edematous  -need to check weights daily  -bmet tomorrow  -cxr doesn't appear to have any acute findings. 6. Hyperglycemia. Felt to be related to steroids. No documentation of diabetes mellitus. Adjust insulin upwards 7. Hypertension/atrial fibrillation. Metoprolol 12.5 mg twice daily. Monitor the increased mobility  8. Right lower extremity hematoma/skin tear. Use gauze to absorb any drainage. 9. Chronic anemia. Transfused one unit 05/18/2010. hgb trending up 10. Clostridium difficile. Flagyl completed. Contact precautions. No diarrhea 11. Leukocytosis: steroid effect.  -dc'ed primaxin  -afebrile   12.  Critical illness myopathy proximal> distal weakness all 4 limbs deltoid strength improving  LOS (Days) 9 A FACE TO FACE EVALUATION WAS PERFORMED  Felcia Huebert T 05/28/2011, 7:55 AM

## 2011-05-28 NOTE — Progress Notes (Signed)
Occupational Therapy Session Note  Patient Details  Name: [name] MRN: [id number] Date of Birth: 09-19-1930  Today's Date: 05/28/2011 Time:  -  1030-1136= 66 minutes    Skilled Therapeutic Interventions/Progress Updates: ADL in w/c at sink.  Patient c/o extra fatigue during ADL after completing BSC transfer earlier in the day.  Focus today was on incorporating conservation techniques and sit to stand. Patient with great difficulty going sit to stand today - after 3 attempts with + 2 assist she was able to complete sit to stand with STEADY with MaxA+2; also attempted LB b/d dressing as energy allowed     Precautions: Fall risk  Pain: no c/os   See FIM for current functional status  Therapy/Group: Individual Therapy  Bud Face [facility] 05/28/2011, 4:34 PM

## 2011-05-29 LAB — BASIC METABOLIC PANEL
Chloride: 99 mEq/L (ref 96–112)
Creatinine, Ser: 1.73 mg/dL — ABNORMAL HIGH (ref 0.50–1.10)
GFR calc Af Amer: 31 mL/min — ABNORMAL LOW (ref >90)
Sodium: 143 mEq/L (ref 135–145)

## 2011-05-29 LAB — PROTIME-INR
INR: 3.01 — ABNORMAL HIGH (ref 0.00–1.49)
Prothrombin Time: 31.7 seconds — ABNORMAL HIGH (ref 11.6–15.2)

## 2011-05-29 LAB — GLUCOSE, CAPILLARY: Glucose-Capillary: 225 mg/dL — ABNORMAL HIGH (ref 70–99)

## 2011-05-29 MED ORDER — WARFARIN SODIUM 1 MG PO TABS
1.0000 mg | ORAL_TABLET | Freq: Once | ORAL | Status: AC
  Administered 2011-05-29: 1 mg via ORAL
  Filled 2011-05-29: qty 1

## 2011-05-29 NOTE — Progress Notes (Signed)
Patient ID: [name], female   DOB: [birth year], 78 y.o.   MRN: [id number] Patient ID: [name], female   DOB: Oct 30, 1930, 78 y.o.   MRN: [id number]  Subjective/Complaints: 2/17-feeling "very well" today   Review of Systems  Respiratory: Positive for cough and shortness of breath.   All other systems reviewed and are negative.    Objective: Vital Signs: Blood pressure 133/63, pulse 72, temperature 98.3 F (36.8 C), temperature source Oral, resp. rate 20, height 5\' 3"  (1.6 m), weight 98.567 kg (217 lb 4.8 oz), SpO2 99.00%. No results found.  Basename 05/27/11 0655  WBC 9.6  HGB 9.0*  HCT 30.1*  PLT 269    Basename 05/27/11 0655  NA 144  K 3.6  CL 102  CO2 31  GLUCOSE 84  BUN 56*  CREATININE 1.45*  CALCIUM 9.4   CBG (last 3)   Basename 05/28/11 2102 05/28/11 1622 05/28/11 1146  GLUCAP 203* 230* 97    Wt Readings from Last 3 Encounters:  05/29/11 98.567 kg (217 lb 4.8 oz)  04/15/11 98.612 kg (217 lb 6.4 oz)  01/04/11 94.856 kg (209 lb 1.9 oz)    Physical Exam:  General appearance: alert, cooperative, no distress and morbidly obese Head: Normocephalic, without obvious abnormality, atraumatic Eyes: conjunctivae/corneas clear. PERRL, EOM's intact. Fundi benign. Ears: normal TM's and external ear canals both ears Nose: Nares normal. Septum midline. Mucosa normal. No drainage or sinus tenderness. Throat: lips, mucosa, and tongue normal; teeth and gums normal Neck: no adenopathy, no carotid bruit, no JVD, supple, symmetrical, trachea midline and thyroid not enlarged, symmetric, no tenderness/mass/nodules Back: symmetric, no curvature. ROM normal. No CVA tenderness. Resp: rhonchi bilaterally comfortable with 2L oxygen. A few crackles at bases Cardio: regular rate and rhythm, S1, S2 normal, no murmur, click, rub or gallop GI: soft, non-tender; bowel sounds normal; no masses,  no organomegaly Extremities: edema tr to 1+ bilateral lower ext's Pulses: 2+ and  symmetric Skin: Skin color, texture, turgor normal. No rashes or lesions Neurologic: moves all 4's with prox greater than distal weakness (3-hips to 4-shoulders out of 5) no sensory isues. Good sitting posture Incision/Wound: lac on right forearm and right leg. Some discharge from right leg wound. 2/17 exam  Assessment/Plan: 1. Functional deficits secondary to profound deconditioning after respiratory failure, diverticulitis which require 3+ hours per day of interdisciplinary therapy in a comprehensive inpatient rehab setting. Physiatrist is providing close team supervision and 24 hour management of active medical problems listed below. Physiatrist and rehab team continue to assess barriers to discharge/monitor patient progress toward functional and medical goals. FIM: FIM - Bathing Bathing Steps Patient Completed: Chest;Right Arm;Left Arm;Abdomen (in w/c at sink) Bathing: 3: Mod-Patient completes 5-7 22f 10 parts or 50-74% (patient required +2 assist for sit to stand today)  FIM - Upper Body Dressing/Undressing Upper body dressing/undressing steps patient completed: Thread/unthread right bra strap;Thread/unthread left bra strap;Thread/unthread right sleeve of pullover shirt/dresss;Thread/unthread left sleeve of pullover shirt/dress;Put head through opening of pull over shirt/dress;Pull shirt over trunk Upper body dressing/undressing: 4: Min-Patient completed 75 plus % of tasks FIM - Lower Body Dressing/Undressing Lower body dressing/undressing steps patient completed: Thread/unthread right pants leg;Thread/unthread left pants leg;Don/Doff right shoe;Don/Doff left shoe (skipped panties today) Lower body dressing/undressing: 2: Max-Patient completed 25-49% of tasks (patient very fatigued and required xtra time and help today)  FIM - Toileting Toileting: 0: Activity did not occur  FIM - Archivist Transfers: 0-Activity did not occur  FIM - Games developer  Transfer  Assistive Devices: Sliding board Bed/Chair Transfer: 0: Activity did not occur  FIM - Locomotion: Wheelchair Locomotion: Wheelchair: 2: Travels 50 - 149 ft with supervision, cueing or coaxing FIM - Locomotion: Ambulation Locomotion: Ambulation Assistive Devices: Designer, industrial/product Ambulation/Gait Assistance: 1: +2 Total assist Locomotion: Ambulation: 1: Travels less than 50 ft with minimal assistance (Pt.>75%)  Comprehension Comprehension Mode: Auditory Comprehension: 7-Follows complex conversation/direction: With no assist  Expression Expression Mode: Verbal Expression: 7-Expresses complex ideas: With no assist  Social Interaction Social Interaction: 6-Interacts appropriately with others with medication or extra time (anti-anxiety, antidepressant). (pt fatigued today &reqd approx 6 five min breaks)  Problem Solving Problem Solving: 6-Solves complex problems: With extra time  Memory Memory: 7-Complete Independence: No helper  1. Deconditioning secondary to diverticulitis/chronic obstructive pulmonary disease, respiratory failure  2. DVT Prophylaxis/Anticoagulation: Chronic Coumadin therapy for atrial fibrillation. Adjusting coumadin to therapeutic level. Watch hgb closely given supratherapeutic values 3. COPD. Chronic oxygen therapy. Continue nebulizer treatments as well as chronic steroid therapy. Follow pulmonary services as needed. Check oxygen saturations every shift. Decreasing oxygen if/as able although she was using at night PTA 4. Chronic renal insufficiency. Baseline creatinine 2.5. Strict I/O.  5. Congestive heart failure.  Increased diuretics. Legs noticeably less edematous  -need to check weights daily  -bmet tomorrow  -cxr doesn't appear to have any acute findings. 6. Hyperglycemia. Felt to be related to steroids. No documentation of diabetes mellitus. Adjust insulin upwards 7. Hypertension/atrial fibrillation. Metoprolol 12.5 mg twice daily. Monitor the increased  mobility  8. Right lower extremity hematoma/skin tear. Use gauze to absorb any drainage. 9. Chronic anemia. Transfused one unit 05/18/2010. hgb trending up 10. Clostridium difficile. Flagyl completed. Contact precautions. No diarrhea 11. Leukocytosis: steroid effect.  -dc'ed primaxin  -afebrile   12.  Critical illness myopathy proximal> distal weakness all 4 limbs deltoid strength improving  LOS (Days) 10 A FACE TO FACE EVALUATION WAS PERFORMED  Raylen Tangonan T 05/29/2011, 7:38 AM

## 2011-05-29 NOTE — Progress Notes (Signed)
ANTICOAGULATION CONSULT NOTE - FOLLOW UP  Pharmacy Consult for Coumadin Indication: afib  Allergies  Allergen Reactions  . Sulfonamide Derivatives     Childhood reaction    Patient Measurements: Height: 5\' 3"  (160 cm) Weight: 217 lb 4.8 oz (98.567 kg) IBW/kg (Calculated) : 52.4     Vital Signs: Temp: 98.3 F (36.8 C) (02/17 0626) Temp src: Oral (02/17 0626) BP: 133/63 mmHg (02/17 0637) Pulse Rate: 72  (02/17 0626)  Labs:  Alvira Philips 05/29/11 0705 05/28/11 0645 05/27/11 0655  HGB -- -- 9.0*  HCT -- -- 30.1*  PLT -- -- 269  APTT -- -- --  LABPROT 31.7* 27.4* 26.4*  INR 3.01* 2.50* 2.38*  HEPARINUNFRC -- -- --  CREATININE 1.73* -- 1.45*  CKTOTAL -- -- --  CKMB -- -- --  TROPONINI -- -- --   Estimated Creatinine Clearance: 30 ml/min (by C-G formula based on Cr of 1.73).  Assessment: INR slightly above goal today. No bleeding noted.   Goal of Therapy:  INR 2-3   Plan:  1. Coumadin 1mg  tonight and continue daily INR.   Woodfin Ganja, PharmD Clinical pharmacist, pager [phone] 05/29/2011,10:39 AM

## 2011-05-29 NOTE — Progress Notes (Signed)
Physical Therapy Note  Patient Details  Name: [name] MRN: [id number] Date of Birth: May 12, 1930 Today's Date: 05/29/2011  1400-1455 (55 minutes) group  Pain- no complaint of pain Oxygen Sats (resting) 91% 3L [place]; pulse 78 Treatment: pt participated in PT gait group to improve gait safety/endurance monitoring Oxygen Sats. Pt ambulated 18 feet X 1 and 10 feet X 1 with RW min assist followed by wc secondary to decreased endurance. Oxygen Sats post gait = 91%' ; pt requires mod assist sit to stand from wc.   Wang [place],JIM 05/29/2011, 3:18 PM

## 2011-05-30 LAB — BASIC METABOLIC PANEL
Chloride: 100 mEq/L (ref 96–112)
GFR calc Af Amer: 32 mL/min — ABNORMAL LOW (ref >90)
GFR calc non Af Amer: 28 mL/min — ABNORMAL LOW (ref >90)
Potassium: 3.8 mEq/L (ref 3.5–5.1)
Sodium: 144 mEq/L (ref 135–145)

## 2011-05-30 LAB — PROTIME-INR: INR: 2.73 — ABNORMAL HIGH (ref 0.00–1.49)

## 2011-05-30 LAB — GLUCOSE, CAPILLARY: Glucose-Capillary: 75 mg/dL (ref 70–99)

## 2011-05-30 MED ORDER — WARFARIN SODIUM 1 MG PO TABS
1.0000 mg | ORAL_TABLET | Freq: Once | ORAL | Status: AC
  Administered 2011-05-30: 1 mg via ORAL
  Filled 2011-05-30: qty 1

## 2011-05-30 NOTE — Progress Notes (Signed)
ANTICOAGULATION CONSULT NOTE - FOLLOW UP  Pharmacy Consult for Coumadin Indication: afib  Allergies  Allergen Reactions  . Sulfonamide Derivatives     Childhood reaction    Patient Measurements: Height: 5\' 3"  (160 cm) Weight: 216 lb 11.2 oz (98.294 kg) IBW/kg (Calculated) : 52.4     Vital Signs: Temp: 98.1 F (36.7 C) (02/18 0616) Temp src: Oral (02/18 0616) BP: 149/61 mmHg (02/18 0616) Pulse Rate: 72  (02/18 0616)  Labs:  Basename 05/30/11 0730 05/29/11 0705 05/28/11 0645  HGB -- -- --  HCT -- -- --  PLT -- -- --  APTT -- -- --  LABPROT 29.4* 31.7* 27.4*  INR 2.73* 3.01* 2.50*  HEPARINUNFRC -- -- --  CREATININE 1.70* 1.73* --  CKTOTAL -- -- --  CKMB -- -- --  TROPONINI -- -- --   Estimated Creatinine Clearance: 30.5 ml/min (by C-G formula based on Cr of 1.7).  Assessment: INR therapeutic today. No bleeding noted.   Goal of Therapy:  INR 2-3   Plan:  1. Coumadin 1 mg tonight and continue daily INR.   Lavonia Dana, PharmD Clinical pharmacist, pager [phone] 05/30/2011,3:11 PM

## 2011-05-30 NOTE — Progress Notes (Signed)
Physical Therapy Note  Patient Details  Name: AZELIA REIGER MRN: [id number] Date of Birth: 1930-10-23 Today's Date: 05/30/2011  1430-1510 (40 minutes) individual Pain- no complaint of pain Oxygen Sats (resting) 96 % 2L [place] (decreased from 3L) pulse 71 Focus of treatment: bilateral LE strengthening (closed chain) to decrease assist for sit to stand Treatment: Transfer: sit to stand min/mod assist from wc; transfer min assist RW; sit to stand from raised mat (23 inches) to BS table min assist 3 X 3.(Oxygen sats > 92% 2 L [place]) ; Standing tolerance to BS table using UE shoulder arc - trial # 1 35 seconds ; trial #2 1 minute 10 seconds ; trial # 3 1 minute. (Oxygen Sats > 92% 2L [place]). Adrea Sherpa,JIM 05/30/2011, 2:42 PM

## 2011-05-30 NOTE — Progress Notes (Signed)
Subjective/Complaints: 2/18- excited about going home this week   Review of Systems  Respiratory: Positive for cough and shortness of breath.   All other systems reviewed and are negative.    Objective: Vital Signs: Blood pressure 149/61, pulse 72, temperature 98.1 F (36.7 C), temperature source Oral, resp. rate 18, height 5\' 3"  (1.6 m), weight 98.294 kg (216 lb 11.2 oz), SpO2 99.00%. No results found. No results found for this basename: WBC:2,HGB:2,HCT:2,PLT:2 in the last 72 hours  Basename 05/30/11 0730 05/29/11 0705  NA 144 143  K 3.8 3.6  CL 100 99  CO2 36* 34*  GLUCOSE 81 105*  BUN 85* 74*  CREATININE 1.70* 1.73*  CALCIUM 9.1 9.2   CBG (last 3)   Basename 05/30/11 0732 05/29/11 2112 05/29/11 1705  GLUCAP 75 281* 225*    Wt Readings from Last 3 Encounters:  05/30/11 98.294 kg (216 lb 11.2 oz)  04/15/11 98.612 kg (217 lb 6.4 oz)  01/04/11 94.856 kg (209 lb 1.9 oz)    Physical Exam:  General appearance: alert, cooperative, no distress and morbidly obese Head: Normocephalic, without obvious abnormality, atraumatic Eyes: conjunctivae/corneas clear. PERRL, EOM's intact. Fundi benign. Ears: normal TM's and external ear canals both ears Nose: Nares normal. Septum midline. Mucosa normal. No drainage or sinus tenderness. Throat: lips, mucosa, and tongue normal; teeth and gums normal Neck: no adenopathy, no carotid bruit, no JVD, supple, symmetrical, trachea midline and thyroid not enlarged, symmetric, no tenderness/mass/nodules Back: symmetric, no curvature. ROM normal. No CVA tenderness. Resp: rhonchi bilaterally comfortable with 2L oxygen. A few crackles at bases Cardio: regular rate and rhythm, S1, S2 normal, no murmur, click, rub or gallop GI: soft, non-tender; bowel sounds normal; no masses,  no organomegaly Extremities: edema tr to 1+ bilateral lower ext's Pulses: 2+ and symmetric Skin: Skin color, texture, turgor normal. No rashes or lesions Neurologic: moves  all 4's with prox greater than distal weakness (3-hips to 4-shoulders out of 5) no sensory isues. Good sitting posture Incision/Wound: lac on right forearm and right leg. Some discharge from right leg wound. 2/17 exam  Assessment/Plan: 1. Functional deficits secondary to profound deconditioning after respiratory failure, diverticulitis which require 3+ hours per day of interdisciplinary therapy in a comprehensive inpatient rehab setting. Physiatrist is providing close team supervision and 24 hour management of active medical problems listed below. Physiatrist and rehab team continue to assess barriers to discharge/monitor patient progress toward functional and medical goals. FIM: FIM - Bathing Bathing Steps Patient Completed: Chest;Right Arm;Left Arm;Abdomen (in w/c at sink) Bathing: 3: Mod-Patient completes 5-7 55f 10 parts or 50-74% (patient required +2 assist for sit to stand today)  FIM - Upper Body Dressing/Undressing Upper body dressing/undressing steps patient completed: Thread/unthread right bra strap;Thread/unthread left bra strap;Thread/unthread right sleeve of pullover shirt/dresss;Thread/unthread left sleeve of pullover shirt/dress;Put head through opening of pull over shirt/dress;Pull shirt over trunk Upper body dressing/undressing: 4: Min-Patient completed 75 plus % of tasks FIM - Lower Body Dressing/Undressing Lower body dressing/undressing steps patient completed: Thread/unthread right pants leg;Thread/unthread left pants leg;Don/Doff right shoe;Don/Doff left shoe (skipped panties today) Lower body dressing/undressing: 2: Max-Patient completed 25-49% of tasks (patient very fatigued and required xtra time and help today)  FIM - Toileting Toileting: 0: Activity did not occur  FIM - Archivist Transfers: 0-Activity did not occur  FIM - Banker Devices: Sliding board Bed/Chair Transfer: 0: Activity did not occur  FIM -  Locomotion: Wheelchair Locomotion: Wheelchair: 1: Total Assistance/staff pushes wheelchair (Pt<25%)  FIM - Locomotion: Ambulation Locomotion: Ambulation Assistive Devices: Designer, industrial/product Ambulation/Gait Assistance: 4: Min assist Locomotion: Ambulation: 1: Travels less than 50 ft with minimal assistance (Pt.>75%)  Comprehension Comprehension Mode: Auditory Comprehension: 7-Follows complex conversation/direction: With no assist  Expression Expression Mode: Verbal Expression: 7-Expresses complex ideas: With no assist  Social Interaction Social Interaction: 6-Interacts appropriately with others with medication or extra time (anti-anxiety, antidepressant).  Problem Solving Problem Solving: 6-Solves complex problems: With extra time  Memory Memory: 7-Complete Independence: No helper  1. Deconditioning secondary to diverticulitis/chronic obstructive pulmonary disease, respiratory failure  2. DVT Prophylaxis/Anticoagulation: Chronic Coumadin therapy for atrial fibrillation. Adjusting coumadin to therapeutic level. Watch hgb closely given supratherapeutic values 3. COPD. Chronic oxygen therapy. Continue nebulizer treatments as well as chronic steroid therapy. Follow pulmonary services as needed. Check oxygen saturations every shift. Decreasing oxygen if/as able although she was using at night PTA 4. Chronic renal insufficiency. Push po fluids. BUN up but CR slightly decreased.   -recheck bmet tomorrow 5. Congestive heart failure.  Increased diuretics. Legs noticeably less edematous  -need to check weights daily  -bmet again tomrrow  -cxr stable last week 6. Hyperglycemia. Felt to be related to steroids. No documentation of diabetes mellitus. Adjust insulin upwards 7. Hypertension/atrial fibrillation. Metoprolol 12.5 mg twice daily. Monitor the increased mobility  8. Right lower extremity hematoma/skin tear. Use gauze to absorb any drainage. 9. Chronic anemia. Transfused one unit  05/18/2010. hgb trending up 10. Clostridium difficile. Flagyl completed. Contact precautions. No diarrhea 11. Leukocytosis: steroid effect.  -dc'ed primaxin  -afebrile   12.  Critical illness myopathy proximal> distal weakness all 4 limbs deltoid strength improving  LOS (Days) 11 A FACE TO FACE EVALUATION WAS PERFORMED  [name] T 05/30/2011, 8:39 AM

## 2011-05-30 NOTE — Progress Notes (Signed)
Occupational Therapy Session Note  Patient Details  Name: [name] MRN: [id number] Date of Birth: Oct 20, 1930  Today's Date: 05/30/2011 Time: 9147-8295 Time Calculation (min): 55 min  Short Term Goals: Week 2:  OT Short Term Goal 1 (Week 2): Pt would transfer to toilet/ BSC with LRAD with min A OT Short Term Goal 2 (Week 2): Pt would perform sit to stand with min A for LB clothing managment OT Short Term Goal 3 (Week 2): Pt would dress LB with min A OT Short Term Goal 4 (Week 2): Pt would transfer into tub with mod A using tub bench OT Short Term Goal 5 (Week 2): Pt would bathe 10/10 parts with AE with min A  Skilled Therapeutic Interventions/Progress Updates:    1:1 self care retraining at sink level - pt's choice. Focus on activity tolerance, standing tolerance, sit to stand (with mod - max A from normal height chair), threading pants, clothing management in standing with one UE support, functional ambulation short distances with min A and verbal cues for hand placements on 2-3 liters of O2 with rest breaks. Pt continuing to progress with activity tolerance and standing tolerance.  Therapy Documentation Precautions:  Precautions Precautions: Fall Precaution Comments: O2 Required Braces or Orthoses: No Restrictions Weight Bearing Restrictions: No General:  Dtr is coming tomorrow for family education   Vital Signs: During functional ambulation pt maintain >95% on 2 liters of O2 with rest breaks for fatigue   Pain:  no c/o  See FIM for current functional status  Therapy/Group: Individual Therapy  Melonie Florida 05/30/2011, 9:42 AM

## 2011-05-30 NOTE — Progress Notes (Signed)
Physical Therapy Note  Patient Details  Name: [name] MRN: [id number] Date of Birth: 1931-03-05 Today's Date: 05/30/2011  Time: 1000-1100 60 minutes  No c/o pain.  Gait training with RW 30', 25' x 2 with focus on household mobility, turning and side stepping with min A.  Sit to stand training from various height surfaces with supervision to mod A depending on height of surface.  Pt continues with difficulty with low surfaces for sit to stand.  Pt demo's improved eccentric control with stand to sit.  Car transfer to sedan simulated height.  Pt min A to get into car.  Pt unable to stand up out of car despite max A.  Discussed this with pt.  May need to consider sliding board for car transfers at this time.  W/c mobility with B UEs with supervision.  Individual therapy   Aella Ronda 05/30/2011, 4:24 PM

## 2011-05-30 NOTE — Progress Notes (Signed)
Per State Regulation 482.30 This chart was reviewed for medical necessity with respect to the patient's Admission/Duration of stay. RNCM & LCSW [Lucy Hoyle] met w/ pt's daughter on Friday to discuss options for continued therapy--daughter would like pt to come home, but, she is unsure about being able to provide care needed.  Daughter plans to come in tomorrow am to try some hands on to see if home soon is workable.  If not, then decision will be betw/ more time in CIR or SNF.   Brock Ra                 Nurse Care Manager              Next Review Date: 05/31/11

## 2011-05-30 NOTE — Progress Notes (Signed)
Occupational Therapy Session Note  Patient Details  Name: [name] MRN: [id number] Date of Birth: Feb 27, 1931  Today's Date: 05/30/2011 Time: 1400-1430 Time Calculation (min): 30 min  Short Term Goals: Week 2:  OT Short Term Goal 1 (Week 2): Pt would transfer to toilet/ BSC with LRAD with min A OT Short Term Goal 2 (Week 2): Pt would perform sit to stand with min A for LB clothing managment OT Short Term Goal 3 (Week 2): Pt would dress LB with min A OT Short Term Goal 4 (Week 2): Pt would transfer into tub with mod A using tub bench OT Short Term Goal 5 (Week 2): Pt would bathe 10/10 parts with AE with min A  Skilled Therapeutic Interventions/Progress Updates:    1:1 focus on functional ambulation with RW to [facility], clothing management, toileting hygiene sitting down, sit to stand from commode chair, standing tolerance to wash hands at sink, activity tolerance/ endurance on 2 liters of O2. Pt continues to need frequent rest breaks  Therapy Documentation Precautions:  Precautions Precautions: Fall Precaution Comments: O2 Required Braces or Orthoses: No Restrictions Weight Bearing Restrictions: No    Pain:  no c/o  See FIM for current functional status  Therapy/Group: Individual Therapy  Melonie Florida 05/30/2011, 3:37 PM

## 2011-05-31 ENCOUNTER — Telehealth: Payer: Self-pay | Admitting: Family Medicine

## 2011-05-31 LAB — BASIC METABOLIC PANEL
CO2: 31 mEq/L (ref 19–32)
Chloride: 100 mEq/L (ref 96–112)
Glucose, Bld: 57 mg/dL — ABNORMAL LOW (ref 70–99)
Potassium: 3.3 mEq/L — ABNORMAL LOW (ref 3.5–5.1)
Sodium: 143 mEq/L (ref 135–145)

## 2011-05-31 LAB — GLUCOSE, CAPILLARY
Glucose-Capillary: 109 mg/dL — ABNORMAL HIGH (ref 70–99)
Glucose-Capillary: 186 mg/dL — ABNORMAL HIGH (ref 70–99)

## 2011-05-31 MED ORDER — FUROSEMIDE 40 MG PO TABS: 20.0000 mg | ORAL_TABLET | Freq: Every day | ORAL | Status: DC

## 2011-05-31 MED ORDER — ADULT MULTIVITAMIN W/MINERALS CH: 1.0000 | ORAL_TABLET | Freq: Every day | ORAL | Status: DC

## 2011-05-31 MED ORDER — FAMOTIDINE 20 MG PO TABS: 20.0000 mg | ORAL_TABLET | Freq: Every day | ORAL | Status: DC

## 2011-05-31 MED ORDER — WARFARIN SODIUM 1 MG PO TABS
1.0000 mg | ORAL_TABLET | Freq: Every day | ORAL | Status: AC
  Administered 2011-05-31: 1 mg via ORAL
  Filled 2011-05-31: qty 1

## 2011-05-31 MED ORDER — POTASSIUM CHLORIDE CRYS ER 20 MEQ PO TBCR
20.0000 meq | EXTENDED_RELEASE_TABLET | Freq: Two times a day (BID) | ORAL | Status: DC
  Administered 2011-05-31 – 2011-06-01 (×3): 20 meq via ORAL
  Filled 2011-05-31 (×5): qty 1

## 2011-05-31 MED ORDER — PREDNISONE 10 MG PO TABS: 30.0000 mg | ORAL_TABLET | Freq: Every day | ORAL | Status: AC

## 2011-05-31 MED ORDER — TIOTROPIUM BROMIDE MONOHYDRATE 18 MCG IN CAPS: 18.0000 ug | ORAL_CAPSULE | Freq: Every day | RESPIRATORY_TRACT | Status: DC

## 2011-05-31 MED ORDER — SODIUM CHLORIDE 0.45 % IV SOLN
INTRAVENOUS | Status: DC
  Administered 2011-05-31: 22:00:00 via INTRAVENOUS

## 2011-05-31 MED ORDER — TRAMADOL HCL 50 MG PO TABS: 50.0000 mg | ORAL_TABLET | Freq: Four times a day (QID) | ORAL | Status: AC | PRN

## 2011-05-31 MED ORDER — SIMVASTATIN 20 MG PO TABS: 20.0000 mg | ORAL_TABLET | Freq: Every day | ORAL | Status: DC

## 2011-05-31 MED ORDER — POTASSIUM CHLORIDE CRYS ER 20 MEQ PO TBCR: 20.0000 meq | EXTENDED_RELEASE_TABLET | Freq: Every day | ORAL | Status: DC

## 2011-05-31 MED ORDER — INSULIN GLARGINE 100 UNIT/ML ~~LOC~~ SOLN: 20.0000 [IU] | Freq: Every day | SUBCUTANEOUS | Status: DC

## 2011-05-31 MED ORDER — HYDRALAZINE HCL 50 MG PO TABS: 50.0000 mg | ORAL_TABLET | Freq: Three times a day (TID) | ORAL | Status: DC

## 2011-05-31 MED ORDER — METOPROLOL TARTRATE 25 MG PO TABS: 12.5000 mg | ORAL_TABLET | Freq: Two times a day (BID) | ORAL | Status: DC

## 2011-05-31 NOTE — Progress Notes (Signed)
Patient ID: [name], female   DOB: [birth year], 78 y.o.   MRN: [id number]  Subjective/Complaints: 2/19--- no new complaints. Daughter in for ed.   Review of Systems  Respiratory: Positive for cough and shortness of breath.   All other systems reviewed and are negative.    Objective: Vital Signs: Blood pressure 149/84, pulse 72, temperature 98.1 F (36.7 C), temperature source Oral, resp. rate 18, height 5\' 3"  (1.6 m), weight 98.294 kg (216 lb 11.2 oz), SpO2 97.00%. No results found. No results found for this basename: WBC:2,HGB:2,HCT:2,PLT:2 in the last 72 hours  Basename 05/30/11 0730 05/29/11 0705  NA 144 143  K 3.8 3.6  CL 100 99  CO2 36* 34*  GLUCOSE 81 105*  BUN 85* 74*  CREATININE 1.70* 1.73*  CALCIUM 9.1 9.2   CBG (last 3)   Basename 05/31/11 0807 05/31/11 0741 05/31/11 0721  GLUCAP 112* 63* 60*    Wt Readings from Last 3 Encounters:  05/30/11 98.294 kg (216 lb 11.2 oz)  04/15/11 98.612 kg (217 lb 6.4 oz)  01/04/11 94.856 kg (209 lb 1.9 oz)    Physical Exam:  General appearance: alert, cooperative, no distress and morbidly obese Head: Normocephalic, without obvious abnormality, atraumatic Eyes: conjunctivae/corneas clear. PERRL, EOM's intact. Fundi benign. Ears: normal TM's and external ear canals both ears Nose: Nares normal. Septum midline. Mucosa normal. No drainage or sinus tenderness. Throat: lips, mucosa, and tongue normal; teeth and gums normal Neck: no adenopathy, no carotid bruit, no JVD, supple, symmetrical, trachea midline and thyroid not enlarged, symmetric, no tenderness/mass/nodules Back: symmetric, no curvature. ROM normal. No CVA tenderness. Resp: rhonchi bilaterally comfortable with 2L oxygen. A few crackles at bases Cardio: regular rate and rhythm, S1, S2 normal, no murmur, click, rub or gallop GI: soft, non-tender; bowel sounds normal; no masses,  no organomegaly Extremities: edema tr to 1+ bilateral lower ext's Pulses: 2+ and  symmetric Skin: Skin color, texture, turgor normal. No rashes or lesions Neurologic: moves all 4's with prox greater than distal weakness (3-hips to 4-shoulders out of 5) no sensory isues. Good sitting posture Incision/Wound: lac on right forearm and right leg. Some discharge from right leg wound. 2/19  exam  Assessment/Plan: 1. Functional deficits secondary to profound deconditioning after respiratory failure, diverticulitis which require 3+ hours per day of interdisciplinary therapy in a comprehensive inpatient rehab setting. Physiatrist is providing close team supervision and 24 hour management of active medical problems listed below. Physiatrist and rehab team continue to assess barriers to discharge/monitor patient progress toward functional and medical goals. FIM: FIM - Bathing Bathing Steps Patient Completed: Chest;Right Arm;Left Arm;Abdomen;Front perineal area;Right upper leg;Left upper leg Bathing: 3: Mod-Patient completes 5-7 76f 10 parts or 50-74%  FIM - Upper Body Dressing/Undressing Upper body dressing/undressing steps patient completed: Thread/unthread right sleeve of pullover shirt/dresss;Put head through opening of pull over shirt/dress;Thread/unthread left sleeve of pullover shirt/dress;Pull shirt over trunk Upper body dressing/undressing: 5: Supervision: Safety issues/verbal cues FIM - Lower Body Dressing/Undressing Lower body dressing/undressing steps patient completed: Thread/unthread right pants leg;Thread/unthread left pants leg;Pull pants up/down;Don/Doff left shoe;Don/Doff right shoe Lower body dressing/undressing: 4: Min-Patient completed 75 plus % of tasks  FIM - Toileting Toileting: 0: Activity did not occur  FIM - Archivist Transfers: 0-Activity did not occur  FIM - Banker Devices: Arm rests Bed/Chair Transfer: 4: Sit > Supine: Min A (steadying pt. > 75%/lift 1 leg)  FIM - Locomotion:  Wheelchair Locomotion: Wheelchair: 1: Travels less than 50  ft with minimal assistance (Pt.>75%) FIM - Locomotion: Ambulation Locomotion: Ambulation Assistive Devices: Walker - Rolling Ambulation/Gait Assistance: 4: Min assist Locomotion: Ambulation: 1: Travels less than 50 ft with minimal assistance (Pt.>75%)  Comprehension Comprehension Mode: Auditory Comprehension: 5-Understands complex 90% of the time/Cues < 10% of the time  Expression Expression Mode: Verbal Expression: 5-Expresses complex 90% of the time/cues < 10% of the time  Social Interaction Social Interaction: 6-Interacts appropriately with others with medication or extra time (anti-anxiety, antidepressant).  Problem Solving Problem Solving: 5-Solves complex 90% of the time/cues < 10% of the time  Memory Memory: 5-Recognizes or recalls 90% of the time/requires cueing < 10% of the time  1. Deconditioning secondary to diverticulitis/chronic obstructive pulmonary disease, respiratory failure  2. DVT Prophylaxis/Anticoagulation: Chronic Coumadin therapy for atrial fibrillation. Adjusting coumadin to therapeutic level. Watch hgb closely given supratherapeutic values 3. COPD. Chronic oxygen therapy. Continue nebulizer treatments as well as chronic steroid therapy. Follow pulmonary services as needed. Check oxygen saturations every shift. Activity tolerance better overall. 4. Chronic renal insufficiency. Push po fluids. BUN up but CR slightly decreased.   -recheck bmet pending today. 5. Congestive heart failure.  Increased diuretics. Legs noticeably less edematous  -need to check weights daily  -bmet again tomrrow  -cxr stable last week 6. Hyperglycemia. Felt to be related to steroids. No documentation of diabetes mellitus. Adjust insulin upwards 7. Hypertension/atrial fibrillation. Metoprolol 12.5 mg twice daily. Monitor the increased mobility  8. Right lower extremity hematoma/skin tear. Use gauze to absorb any drainage. 9.  Chronic anemia. Transfused one unit 05/18/2010. hgb trending up 10. Clostridium difficile. Flagyl completed. Contact precautions. No diarrhea 11. Leukocytosis: steroid effect.  -dc'ed primaxin  -afebrile  12.  Critical illness myopathy proximal> distal weakness all 4 limbs deltoid strength improving  LOS (Days) 12 A FACE TO FACE EVALUATION WAS PERFORMED  Margrit Minner T 05/31/2011, 8:16 AM

## 2011-05-31 NOTE — Progress Notes (Signed)
Occupational Therapy Session Note  Patient Details  Name: [name] MRN: [id number] Date of Birth: 09-04-1930  Today's Date: 05/31/2011 Time: 4098-1191 Time Calculation (min): 60 min  Short Term Goals: Week 2:  OT Short Term Goal 1 (Week 2): Pt would transfer to toilet/ BSC with LRAD with min A OT Short Term Goal 2 (Week 2): Pt would perform sit to stand with min A for LB clothing managment OT Short Term Goal 3 (Week 2): Pt would dress LB with min A OT Short Term Goal 4 (Week 2): Pt would transfer into tub with mod A using tub bench OT Short Term Goal 5 (Week 2): Pt would bathe 10/10 parts with AE with min A  Skilled Therapeutic Interventions/Progress Updates:    1:1 family education with pt and pt's daughter on self care / ADL aspects in preparation for d/c home tomorrow including: toilet transfer with RW,  toileting sitting on BSC, short distance functional ambulation to get into BR, sit to stands and how to assist with prop hand placement, tub bench transfer into shower, bathing and dressing sit to stand, activity tolerance, follow up therapy, home exercise/ activities ideas for home. Pt's dtr very pleased with her progress.  Therapy Documentation Precautions:  Precautions Precautions: Fall Precaution Comments: O2 Required Braces or Orthoses: No Restrictions Weight Bearing Restrictions: No Pain: Pain Assessment Pain Assessment: No/denies pain Other Treatments:  See FIM for current functional status  Therapy/Group: Individual Therapy  Melonie Florida 05/31/2011, 12:15 PM

## 2011-05-31 NOTE — Progress Notes (Addendum)
Physical Therapy Session Note  Patient Details  Name: [name] MRN: [id number] Date of Birth: 06-Jul-1930  Today's Date: 05/31/2011 Time: 4098-1191 Time Calculation (min): 60 min  Short Term Goals: See LTG's  Skilled Therapeutic Interventions/Progress Updates:    Began education with daughter as pt now to d/c home to daughter's house Wed. Daughter had to leave to go to work but will be here tomorrow at 2:30 and understands she will need to do education with PT. Began car transfer training, daughter wanting slide board but pt insistent on stand pivot. Performed slide board transfer into car min A and needed A to lift LE's into car. Performed stan pivot with RW out of car with increased effort and time to stand from sedan height but eventually able with min A. Gait training :Patient at increased fall risk as noted by decreased gait velocity.  (increased fall risk with velocity less than 1.8 ft/sec). Patient current velocity is .45 ft/sec with decreased functional efficiency. Performed bed mobility on regular bed and w/c mobility to improve activity tolerance. (required 2 brief rest breaks in 150 ft) Pt will require light weight w/c at d/c d/t UE weakness, impaired cardiorespiratory endurance and CHF and decreased activity tolerance to allow maximal I for MRADL's in the home. Pt and daughter pleased with progress.   Therapy Documentation Precautions:  Precautions Precautions: Fall Precaution Comments: O2 Required Braces or Orthoses: No Restrictions Weight Bearing Restrictions: No   Vital Signs: Therapy Vitals Pulse Rate: 84  (84) Resp: 18  Patient Position, if appropriate: Sitting Oxygen Therapy SpO2: 96 % O2 Device: Nasal cannula O2 Flow Rate (L/min): 2 L/min Pain: Pain Assessment Pain Assessment: No/denies pain Mobility: Bed Mobility Supine to Sit: 5: Supervision Sitting - Scoot to Edge of Bed: 5: Supervision Sit to Supine: 5: Supervision Transfers Sit to Stand: 4:  Min assist;From bed;With armrests;From chair/3-in-1 Stand to Sit: 5: Supervision;To chair/3-in-1;With armrests;With upper extremity assist (bed, 19 inch height chair and w/c) Locomotion : Ambulation Ambulation/Gait Assistance: 5: Supervision;4: Min assist (min A household environement, S controlled) Ambulation Distance (Feet): 25 Feet (x2) Gait Gait velocity: .45 ft/sec Stairs / Additional Locomotion Stairs: Yes (dtr says she is I bumping w/c on steps)     Other Treatments: Treatments Therapeutic Activity: car transfer training stand pivot with Rw or sllide board min A with increased time and effort, rest breaks required  See FIM for current functional status  Therapy/Group: Individual Therapy  Michaelene Song 05/31/2011, 10:44 AM

## 2011-05-31 NOTE — Progress Notes (Signed)
Inpatient Diabetes Program Recommendations  AACE/ADA: New Consensus Statement on Inpatient Glycemic Control (2009)  Target Ranges:  Prepandial:   less than 140 mg/dL      Peak postprandial:   less than 180 mg/dL (1-2 hours)      Critically ill patients:  140 - 180 mg/dL   Reason for Visit: Mild fasting hypoglycemia  Inpatient Diabetes Program Recommendations Insulin - Basal: May need decrease Lantus to 12 units at this time. Pt had cbg of 63 mg/dL this am   Note:Thank you, Lenor Coffin, RN, CNS, Diabetes Coordinator [phone])

## 2011-05-31 NOTE — Progress Notes (Signed)
CBG: 60  Treatment: 15 GM carbohydrate snack  Symptoms: None  Follow-up CBG: Time:0741 CBG Result:63  Possible Reasons for Event: Unknown  Comments/MD notified:Patient breakfast delivered and patient is eating; MD to be notified by Annice Needy, RN once CBG is greater than 70.    Neva Seat

## 2011-05-31 NOTE — Plan of Care (Signed)
Problem: RH Tub/Shower Transfers Goal: LTG Patient will perform tub/shower transfers w/assist (OT) LTG: Patient will perform tub/shower transfers with assist, with/without cues using equipment (OT)  Outcome: Not Met (add Reason) Needed mod A for bilateral LE

## 2011-05-31 NOTE — Discharge Instructions (Signed)
Inpatient Rehab Discharge Instructions  [name] Discharge date and time: No discharge date for patient encounter.   Activities/Precautions/ Functional Status: Activity: activity as tolerated Diet: diabetic diet Wound Care: none needed Functional status:  ___ No restrictions     ___ Walk up steps independently __x_ 24/7 supervision/assistance   __x_ Walk up steps with assistance ___ Intermittent supervision/assistance  ___ Bathe/dress independently ___ Walk with walker     ___ Bathe/dress with assistance ___ Walk Independently    ___ Shower independently _x Walk with assistance    __x_ Shower with assistance ___ No alcohol     ___ Return to work/school ________   COMMUNITY REFERRALS UPON DISCHARGE:    Home Health:   PT    OT      RN                    Agency: [facility] Phone: [phone]   Medical Equipment/Items Ordered: wheelchair, cushion, drop arm commode and transfer board                                                                  Agency/Supplier: [facility]      Special Instructions: Home health nurse to check INR on February 22 results to Dr. Kerin Perna at [phone] fax [phone]   My questions have been answered and I understand these instructions. I will adhere to these goals and the provided educational materials after my discharge from the hospital.  Patient/Caregiver Signature _______________________________ Date __________  Clinician Signature _______________________________________ Date __________  Please bring this form and your medication list with you to all your follow-up doctor's appointments.

## 2011-05-31 NOTE — Telephone Encounter (Signed)
Jesusita Oka, the rehab PA at [facility] wanted you to know that [name] is leaving tomorrow and he will set up the Coumadin.  The protime will be drawn Friday 06/03/11 and you can take over from there.

## 2011-05-31 NOTE — Telephone Encounter (Signed)
i don't fully understand  Can you make sure she has a f/u appt with me, 30 min, in the next 2 weeks  And a repeat INR 2 weeks after that date above set up [name])

## 2011-05-31 NOTE — Discharge Summary (Signed)
[name], [name] Burch NO.:  [id number]  MEDICAL RECORD NO.:  [id number]  LOCATION:  4032                         FACILITY:  [facility]  PHYSICIAN:  Ranelle Oyster, M.D.DATE OF BIRTH:  01-Jun-1930  DATE OF ADMISSION:  05/19/2011 DATE OF DISCHARGE:  06/01/2011                              DISCHARGE SUMMARY   DISCHARGE DIAGNOSES: 1. Deconditioning secondary to diverticulitis - chronic obstructive     pulmonary disease. 2. Chronic Coumadin therapy for atrial fibrillation. 3. Chronic obstructive pulmonary disease with chronic oxygen therapy. 4. Chronic renal insufficiency with baseline 2.5. 5. Hypertension. 6. Right lower extremity hematoma. 7. Chronic anemia. 8. Clostridium difficile - resolved.  HISTORY OF PRESENT ILLNESS:  This is a 78 year old right-handed female with history of atrial fibrillation on chronic Coumadin therapy as well as chronic obstructive pulmonary disease with home oxygen a.m. and prior ARDS requiring tracheostomy in 2005, who was admitted December 22 with abdominal discomfort mainly to the right lower quadrant, associated nausea.  She denied any diarrhea or fever.  CT of the abdomen revealed acute diffuse diverticulitis.  Seen by General Surgery, initially placed on TPN for nutritional support, later changed to nasogastric tube feeds. Her diet was slowly advanced, nasogastric tube removed.  The patient developed altered mental status and hypoxic respiratory failure requiring intubation on December 27 and followed by Critical Care Medicine.  She was extubated on December 29 after treatment for hospital- acquired pneumonia.  Due to her overall deconditioning respiratory status,  she was discharged to [facility] on January 8 for ongoing medical management.  Her hospital course there was complicated by ongoing bouts of recurrent respiratory failure with repeat intubation.  Her chronic Coumadin had been held for short  time due to nose bleeds, which resolved and her Coumadin since has been resumed, cardiac rate remained controlled.  She was treated with Flagyl for Clostridium difficile.  Bouts of anemia, transfuse 1 unit packed red blood cells on February 7 for hemoglobin of 7.7.  Her white blood cell count was elevated 18,000, felt to be induced secondary to steroids, which she was on for her COPD.  She was placed on a 10-day course of empiric Zosyn and vancomycin for coverage of her pulmonary status as well as a Pseudomonas urinary tract infection.  She had a right lower extremity hematoma skin tear with dressing in place.  She was admitted for comprehensive rehab program.  PAST MEDICAL HISTORY:  See discharge diagnoses.  SOCIAL HISTORY:  [place] prior to admission.  She has family assistance.  ALLERGIES:  SULFA DERIVATIVES.  PHYSICAL EXAMINATION:  VITAL SIGNS:  Blood pressure 142/69, pulse 81, temperature 98.1, respirations 18.  GENERAL:  This was an alert female, in no acute distress.  Oriented x3. HEENT:  Normocephalic. LUNGS:  Decreased breath sounds.  Clear to auscultation.  She had +1 edema to the lower extremities. SKIN:  Warm and dry. EXTREMITIES:  She had a small skin tear to the right lower extremity. Motor strength, 3-/5 bilateral deltoids 4-/5 to the biceps, triceps and grip, 3-/5 bilateral hip flexors, 4-/5 knee extension.  HOSPITAL COURSE:  The patient was admitted to inpatient rehab services with  therapies initiated on a 3-hour daily basis consisting of physical therapy, occupational therapy, and rehabilitation nursing.  The following issues were addressed during the patient's rehabilitation stay.  Pertaining to [name] Burch's deconditioning secondary to diverticulitis, chronic obstructive pulmonary disease and respiratory failure, she continued to progress nicely with her overall fatigue factors, greatly improved.  She remained on chronic  Coumadin for atrial fibrillation with cardiac rate controlled.  No further bleeding episodes.  She was followed by Dr. Dallas Schimke at [place], [place] at [phone] with a home health nurse arranged for the necessary blood draws.  She continued on chronic oxygen therapy with nebulizer treatments as advised.  Oxygen saturations remained at baseline. Chronic renal insufficiency with creatinine 2.5, and monitored.  She exhibited no signs of fluid overload.  Her blood pressures were well controlled with no orthostatic changes.  Anemia is stable.  She had a small skin tear to the right lower extremity, dressing changes as advised.  She had completed an empiric course of Zosyn and vancomycin for Pseudomonas urinary tract infection as well as coverage for pulmonary status.  She remained afebrile.  She had completed a course of Flagyl for Clostridium difficile with no further diarrhea noted.  The patient received weekly collaborative interdisciplinary team conferences to discuss estimated length of stay, family teaching, and any barriers to discharge.  She was minimal assist upper body, moderate assist for lower body dressing, steady assist with [place] Dec transfers +2 with a lift, moderate assist overall for her mobility.  All issues in regards to her therapy were discussed with family on questionable need for skilled nursing facility versus home.  Family felt they could provide the necessary assistance at home and discharge is to take place on June 01, 2011.  LATEST LABS:  Showed an INR of 2.72.  Latest chemistries with sodium 143, potassium 3.3, BUN 89, creatinine of 1.49, which was much improved. Latest CBC with hemoglobin of 9.0, hematocrit of 30.  This showed a gracious improvement from 8.2.  DISCHARGE MEDICATIONS AT TIME OF DICTATION: 1. Proventil nebulizer treatments twice daily. 2. Pepcid 1 tablet daily. 3. Hydralazine 50 mg every 8 hours. 4. Lantus insulin 15 units at bedtime. 5.  Atrovent nebulizer treatments twice daily. 6. Lopressor 12.5 mg twice daily. 7. Multivitamin daily. 8. Prednisone 30 mg daily. 9. Zocor 20 mg daily. 10.Spiriva 18 mcg daily. 11.Ultram 1-2 tablets every 6 hours as needed pain. 12.Coumadin 1 mg daily, adjusted accordingly for an INR of 2.0 to 3.0.  DIET:  A diabetic diet.  SPECIAL INSTRUCTIONS:  The patient should have prothrombin time by home health nurse on February 22.  Result to Dr. Kerin Perna [phone], fax [phone].  She should follow up with her primary MD, Dr. Dallas Schimke, medical management.  Chronic oxygen will be ongoing as prior to hospital admission.  Follow up with Dr. Faith Rogue at the outpatient rehab service office as needed.     Mariam Dollar, P.A.   ______________________________ Ranelle Oyster, M.D.    DA/MEDQ  D:  05/31/2011  T:  05/31/2011  Job:  [id number]  cc:   Juleen China, MD

## 2011-05-31 NOTE — Progress Notes (Signed)
Occupational Therapy Session Note  Patient Details  Name: [name] MRN: [id number] Date of Birth: 1931/01/04  Today's Date: 05/31/2011 Time: 1130-1200 Time Calculation (min): 30 min  Skilled Therapeutic Interventions:  Focus session on activity tolerance, sit><stand, functional mobility with w/c><raised commode transfers using walker.  Patient required Mod assist to stand from w/c and min assist to stand from raised commode.  The step-pivot portion of the transfer was steady assist with patient using the walker.  Patient reports that she had a really busy morning so she was very tired. Patient also demonstrated pressure relief techniques while seated in w/c and bilateral ankle pumps (edema in both feet, left more than right).   No reports of pain  Precautions:  Precautions Precautions: Fall Precaution Comments: O2 Required Braces or Orthoses: No Restrictions Weight Bearing Restrictions: No  See FIM for current functional status  Therapy/Group: Individual Therapy  Demarion Pondexter 05/31/2011, 12:12 PM

## 2011-05-31 NOTE — Progress Notes (Signed)
ANTICOAGULATION CONSULT NOTE - FOLLOW UP  Pharmacy Consult for Coumadin Indication: afib  Allergies  Allergen Reactions  . Sulfonamide Derivatives     Childhood reaction    Patient Measurements: Height: 5\' 3"  (160 cm) Weight: 216 lb 11.2 oz (98.294 kg) IBW/kg (Calculated) : 52.4     Vital Signs: Temp: 98.1 F (36.7 C) (02/19 0555) Temp src: Oral (02/19 0555) BP: 149/84 mmHg (02/19 0555) Pulse Rate: 84  (02/19 1038)  Labs:  Basename 05/31/11 0655 05/30/11 0730 05/29/11 0705  HGB -- -- --  HCT -- -- --  PLT -- -- --  APTT -- -- --  LABPROT 29.3* 29.4* 31.7*  INR 2.72* 2.73* 3.01*  HEPARINUNFRC -- -- --  CREATININE 1.49* 1.70* 1.73*  CKTOTAL -- -- --  CKMB -- -- --  TROPONINI -- -- --   Estimated Creatinine Clearance: 34.8 ml/min (by C-G formula based on Cr of 1.49).  Assessment: INR therapeutic today- appears stable on 1mg  dose for time being. No bleeding noted. Noted plans to d/c home today with INR check on 2/22.   Goal of Therapy:  INR 2-3   Plan:  1. Coumadin 1 mg daily.   Lavonia Dana, PharmD Clinical pharmacist, pager [phone] 05/31/2011,1:42 PM

## 2011-05-31 NOTE — Progress Notes (Signed)
Therapeutic Recreation Discharge Summary Patient Details  Name: TIMAYA BOJARSKI MRN: [id number] Date of Birth: December 14, 1930  Long term goals set: 1  Long term goals met: 1  Comments on progress toward goals: Pt has made good progress toward LTG and is supervision for seated TR tasks due to need for set-up assist.  Pt is extremely hard working in therapy sessions and is motivated to regain strength.  Pt is ready for discharge to daughter's home with 24 hour assit.  Reasons goals not met: n/a  Equipment acquired: n/a  Reasons for discharge: discharge from hospital  Patient/family agrees with progress made and goals achieved: Yes  Tiny Rietz 05/31/2011, 3:48 PM

## 2011-05-31 NOTE — Progress Notes (Signed)
Physical Therapy Session Note  Patient Details  Name: [name] MRN: [id number] Date of Birth: November 29, 1930  Today's Date: 05/31/2011 Time: 1330-1400 Time Calculation (min): 30 min  See LTG's  Skilled Therapeutic Interventions/Progress Updates:    Therapeutic exercise performed with LE to increase strength for functional mobility, fall prevention and standing balance, activity tolerance. Seated rest breaks required. Pt educated to bring feet further back under her prior to initiating standing and pt able to stand with close S from mat.  Therapy Documentation Precautions:  Precautions Precautions: Fall Precaution Comments: O2 Required Braces or Orthoses: No Restrictions Weight Bearing Restrictions: No Pain: Pain Assessment Pain Assessment: No/denies pain Pain Score: 0-No pain     Balance: close S with BUE support during LE therex   Exercises: General Exercises - Lower Extremity Long Arc Quad: 15 reps;Seated;Strengthening;Weights (1#) Hip ABduction/ADduction: AROM;Strengthening;Both;10 reps;Standing Hip Flexion/Marching: 10 reps;AROM Toe Raises: 10 reps (very little lift off d/t weakness) Mini-Sqauts: Strengthening;Standing;10 reps seated DF and PF, 1 # on for standing hip flexion and abduction plus LAQ See FIM for current functional status  Therapy/Group: Individual Therapy  Michaelene Song 05/31/2011, 1:54 PM

## 2011-05-31 NOTE — Progress Notes (Signed)
Occupational Therapy Discharge Summary  Patient Details  Name: [name] MRN: [id number] Date of Birth: Sep 15, 1930 Today's Date: 05/31/2011  Patient has met 8 of 9 long term goals due to improved activity tolerance, improved balance, postural control, functional use of  RIGHT upper, RIGHT lower, LEFT upper and LEFT lower extremity, improved attention, improved awareness and improved coordination.  Patient to discharge at [facility] Assist level sit to stand, using a RW for functional ambulation into BR for toileting and to transfer into tub shower using a tub bench.  Patient's care partner is independent to provide the necessary physical assistance at discharge.   Reasons goals not met: Pt didn't met her LTG of min A for tub shower transfer secondary to pt needed assistance with bilateral LE over the edge of the tub using tub bench due to fatigue  Recommendation:  Patient will benefit from ongoing skilled OT services in home health setting to continue to advance functional skills in the area of BADL to continue to work on activity tolerance/ endurance and sit to stands in functional ADL tasks to continue to decreased the burden of care.  Equipment: drop arm commode  Reasons for discharge: treatment goals met and discharge from hospital  Patient/family agrees with progress made and goals achieved: Yes  OT Discharge Precautions/Restrictions  Precautions Precautions: Fall Required Braces or Orthoses: No Restrictions Weight Bearing Restrictions: No Pain Pain Assessment Pain Assessment: No/denies pain ADL ADL Eating: Independent Where Assessed-Eating: Chair Grooming: Modified independent Where Assessed-Grooming: Sitting at sink Upper Body Bathing: Supervision/safety Where Assessed-Upper Body Bathing: Shower Lower Body Bathing: Minimal assistance Where Assessed-Lower Body Bathing: Shower Upper Body Dressing: Setup Where Assessed-Upper Body Dressing: Chair Lower Body  Dressing: Minimal assistance Where Assessed-Lower Body Dressing: Chair Toileting: Minimal assistance Where Assessed-Toileting: Bedside Commode Toilet Transfer: Minimal assistance Toilet Transfer Method: Stand pivot Toilet Transfer Equipment: Drop arm bedside commode Tub/Shower Transfer: Moderate assistance Tub/Shower Transfer Method: Ambulating Tub/Shower Equipment: Transfer tub bench ADL Comments: ambulated into bathroom with RW with min A Vision/Perception  Vision - History Baseline Vision: No visual deficits Patient Visual Report: No change from baseline Vision - Assessment Eye Alignment: Within Functional Limits Perception Perception: Within Functional Limits Praxis Praxis: Intact  Cognition Overall Cognitive Status: Appears within functional limits for tasks assessed Arousal/Alertness: Awake/alert Orientation Level: Oriented X4 Memory: Appears intact Awareness: Appears intact Problem Solving: Appears intact Safety/Judgment: Appears intact Sensation Sensation Light Touch: Appears Intact Stereognosis: Appears Intact Hot/Cold: Appears Intact Proprioception: Appears Intact Coordination Gross Motor Movements are Fluid and Coordinated: Yes Fine Motor Movements are Fluid and Coordinated: Yes Motor  Motor Motor: Within Functional Limits Motor - Discharge Observations: improved posture and alignment, improved weakness but overall good improvement from eval Mobility  Transfers Transfers: Yes  Trunk/Postural Assessment  Cervical Assessment Cervical Assessment: Within Functional Limits Thoracic Assessment Thoracic Assessment: Within Functional Limits Lumbar Assessment Lumbar Assessment: Within Functional Limits Postural Control Postural Control: Within Functional Limits  Balance Balance Balance Assessed: Yes Static Sitting Balance Static Sitting - Balance Support: Feet supported Static Sitting - Level of Assistance: 6: Modified independent (Device/Increase  time) Static Standing Balance Static Standing - Level of Assistance: 5: Stand by assistance Extremity/Trunk Assessment RUE Assessment RUE Assessment: Within Functional Limits LUE Assessment LUE Assessment: Within Functional Limits  See FIM for current functional status  Melonie Florida 05/31/2011, 3:44 PM

## 2011-05-31 NOTE — Plan of Care (Signed)
Problem: RH Ambulation Goal: LTG Patient will ambulate in controlled environment (PT) LTG: Patient will ambulate in a controlled environment, # of feet with assistance (PT).  Outcome: Not Met (add Reason) Distance not met

## 2011-06-01 DIAGNOSIS — I509 Heart failure, unspecified: Secondary | ICD-10-CM

## 2011-06-01 DIAGNOSIS — Z5189 Encounter for other specified aftercare: Secondary | ICD-10-CM

## 2011-06-01 DIAGNOSIS — R5381 Other malaise: Secondary | ICD-10-CM

## 2011-06-01 DIAGNOSIS — G7281 Critical illness myopathy: Secondary | ICD-10-CM

## 2011-06-01 LAB — BASIC METABOLIC PANEL
BUN: 71 mg/dL — ABNORMAL HIGH (ref 6–23)
CO2: 35 mEq/L — ABNORMAL HIGH (ref 19–32)
Calcium: 9 mg/dL (ref 8.4–10.5)
Chloride: 101 mEq/L (ref 96–112)
Creatinine, Ser: 1.34 mg/dL — ABNORMAL HIGH (ref 0.50–1.10)

## 2011-06-01 LAB — GLUCOSE, CAPILLARY
Glucose-Capillary: 71 mg/dL (ref 70–99)
Glucose-Capillary: 90 mg/dL (ref 70–99)

## 2011-06-01 LAB — PROTIME-INR
INR: 2.66 — ABNORMAL HIGH (ref 0.00–1.49)
Prothrombin Time: 28.8 seconds — ABNORMAL HIGH (ref 11.6–15.2)

## 2011-06-01 MED ORDER — WARFARIN SODIUM 1 MG PO TABS
1.0000 mg | ORAL_TABLET | Freq: Every morning | ORAL | Status: DC
  Administered 2011-06-01: 1 mg via ORAL
  Filled 2011-06-01: qty 1

## 2011-06-01 MED ORDER — WARFARIN SODIUM 1 MG PO TABS: 1.0000 mg | ORAL_TABLET | Freq: Every morning | ORAL | Status: DC

## 2011-06-01 NOTE — Progress Notes (Signed)
Per State Regulation 482.30 This chart was reviewed for medical necessity with respect to the patient's Admission/Duration of stay. Pt  received some IVF last night [see MD notes team conference].  Daughter to complete family ed this afternoon & then pt will d/c home w/ daughter.   Brock Ra                 Nurse Care Manager              Next Review Date: none

## 2011-06-01 NOTE — Patient Care Conference (Signed)
Inpatient RehabilitationTeam Conference Note Date: 05/31/2011   Time: 2:50 PM    Patient Name: [name]      Medical Record Number: [id number]  Date of Birth: 11/14/1930 Sex: Female         Room/Bed: 4032/4032-01 Payor Info: Payor: MEDICARE  Plan: MEDICARE PART A AND B  Product Type: *No Product type*     Admitting Diagnosis: Deconditioned, PNA,COPD  Admit Date/Time:  05/19/2011  3:52 PM Admission Comments: No comment available   Primary Diagnosis:  COPD with acute exacerbation Principal Problem: COPD with acute exacerbation  Patient Active Problem List  Diagnoses Date Noted  . Physical deconditioning 05/20/2011  . COPD with acute exacerbation 05/20/2011  . C. difficile colitis 04/16/2011  . Atelectasis 04/07/2011  . Hyperkalemia 04/07/2011  . Diverticulitis of jejunum 04/03/2011  . Fever 04/02/2011  . Bradycardia 04/02/2011  . Venous reflux 02/14/2011  . Osteoarthritis 01/04/2011  . Fatigue 07/20/2010  . HYPERLIPIDEMIA-MIXED 03/22/2010  . HYPOTHYROIDISM 08/03/2009  . OBSTRUCTIVE SLEEP APNEA 10/07/2008  . HYPERTENSION, UNSPECIFIED 10/01/2008  . SLEEP APNEA 09/29/2008  . DIABETES MELLITUS, TYPE II 08/20/2008  . OVERWEIGHT/OBESITY 08/20/2008  . Atrial fibrillation 08/20/2008  . DIASTOLIC HEART FAILURE, CHRONIC 08/20/2008  . COPD 08/20/2008  . RENAL FAILURE, CHRONIC 08/20/2008    Expected Discharge Date: Expected Discharge Date: 06/01/11  Team Members Present: Physician: Dr. Faith Rogue Case Manager Present: Melanee Spry, RN Social Worker Present: Amada Jupiter, LCSW Nurse Present: Laural Roes, RN;Angie Alona Bene, RN PT Present: Elvia Collum, PT OT Present: Roney Mans, Loistine Chance, OT SLP Present: Feliberto Gottron, SLP Other (Discipline and Name): Tora Duck, PPS Coordinator     Current Status/Progress Goal Weekly Team Focus  Medical   diuresis overshot and now requiring a little fluid resuscitation  balance i's and o's, edema control while maintaining  renal function  see above   Bowel/Bladder   Continent of bowel and bladder most of the time  continent all of the time  continue timed toileting   Swallow/Nutrition/ Hydration             ADL's   min A overall   min A overall  complete family education   Mobility   supervision-mod A  min A  family ed   Communication             Safety/Cognition/ Behavioral Observations            Pain   on scheduled Ultram for pain  pain controlled at a 2  offer prn's for pain as needed   Skin   buttocks red and sore  resolved by d/c  Barrier creame to buttocks      *See Interdisciplinary Assessment and Plan and progress notes for long and short-term goals  Barriers to Discharge: none    Possible Resolutions to Barriers:  none    Discharge Planning/Teaching Needs:  Home with daughter and son-in-law to provide 24/7 assistance - family ed to be completed tomorrow      Team Discussion: Family ed to be completed tomorrow afternoon & then pt will d/c w/ daughter.     Revisions to Treatment Plan: none    Continued Need for Acute Rehabilitation Level of Care: The patient requires daily medical management by a physician with specialized training in physical medicine and rehabilitation for the following conditions: Daily direction of a multidisciplinary physical rehabilitation program to ensure safe treatment while eliciting the highest outcome that is of practical value to the patient.: Yes Daily medical management of  patient stability for increased activity during participation in an intensive rehabilitation regime.: Yes Daily analysis of laboratory values and/or radiology reports with any subsequent need for medication adjustment of medical intervention for : Pulmonary problems;Cardiac problems;Other  Brock Ra 06/01/2011, 2:10 PM

## 2011-06-01 NOTE — Progress Notes (Signed)
Patient ID: [name], female   DOB: 03-Apr-1931, 78 y.o.   MRN: [id number] Patient ID: [name], female   DOB: 1931-02-20, 78 y.o.   MRN: [id number]  Subjective/Complaints: 2/20- no new complaints   Review of Systems  Respiratory: Positive for cough and shortness of breath.   All other systems reviewed and are negative.    Objective: Vital Signs: Blood pressure 121/78, pulse 69, temperature 98.4 F (36.9 C), temperature source Oral, resp. rate 18, height 5\' 3"  (1.6 m), weight 87.1 kg (192 lb 0.3 oz), SpO2 99.00%. No results found. No results found for this basename: WBC:2,HGB:2,HCT:2,PLT:2 in the last 72 hours  Basename 06/01/11 0601 05/31/11 0655  NA 141 143  K 4.3 3.3*  CL 101 100  CO2 35* 31  GLUCOSE 67* 57*  BUN 71* 88*  CREATININE 1.34* 1.49*  CALCIUM 9.0 9.1   CBG (last 3)   Basename 06/01/11 0728 06/01/11 0712 05/31/11 2122  GLUCAP 71 58* 186*    Wt Readings from Last 3 Encounters:  06/01/11 87.1 kg (192 lb 0.3 oz)  04/15/11 98.612 kg (217 lb 6.4 oz)  01/04/11 94.856 kg (209 lb 1.9 oz)    Physical Exam:  General appearance: alert, cooperative, no distress and morbidly obese Head: Normocephalic, without obvious abnormality, atraumatic Eyes: conjunctivae/corneas clear. PERRL, EOM's intact. Fundi benign. Ears: normal TM's and external ear canals both ears Nose: Nares normal. Septum midline. Mucosa normal. No drainage or sinus tenderness. Throat: lips, mucosa, and tongue normal; teeth and gums normal Neck: no adenopathy, no carotid bruit, no JVD, supple, symmetrical, trachea midline and thyroid not enlarged, symmetric, no tenderness/mass/nodules Back: symmetric, no curvature. ROM normal. No CVA tenderness. Resp: rhonchi bilaterally comfortable with 2L oxygen. A few crackles at bases Cardio: regular rate and rhythm, S1, S2 normal, no murmur, click, rub or gallop GI: soft, non-tender; bowel sounds normal; no masses,  no organomegaly Extremities: edema  tr to 1+ bilateral lower ext's Pulses: 2+ and symmetric Skin: Skin color, texture, turgor normal. No rashes or lesions Neurologic: moves all 4's with prox greater than distal weakness (3-hips to 4-shoulders out of 5) no sensory isues. Good sitting posture Incision/Wound: lac on right forearm and right leg. Some discharge from right leg wound. 2/20  exam  Assessment/Plan: 1. Functional deficits secondary to profound deconditioning after respiratory failure, diverticulitis which require 3+ hours per day of interdisciplinary therapy in a comprehensive inpatient rehab setting. Physiatrist is providing close team supervision and 24 hour management of active medical problems listed below. Physiatrist and rehab team continue to assess barriers to discharge/monitor patient progress toward functional and medical goals. FIM: FIM - Bathing Bathing Steps Patient Completed: Chest;Right Arm;Left Arm;Right upper leg;Left upper leg;Abdomen;Front perineal area;Buttocks Bathing: 4: Min-Patient completes 8-9 37f 10 parts or 75+ percent  FIM - Upper Body Dressing/Undressing Upper body dressing/undressing steps patient completed: Thread/unthread right sleeve of pullover shirt/dresss;Thread/unthread left sleeve of pullover shirt/dress;Put head through opening of pull over shirt/dress;Pull shirt over trunk Upper body dressing/undressing: 5: Set-up assist to: Obtain clothing/put away FIM - Lower Body Dressing/Undressing Lower body dressing/undressing steps patient completed: Thread/unthread right pants leg;Thread/unthread left pants leg;Pull pants up/down;Don/Doff right shoe;Don/Doff left shoe Lower body dressing/undressing: 4: Steadying Assist  FIM - Toileting Toileting steps completed by patient: Adjust clothing prior to toileting;Performs perineal hygiene;Adjust clothing after toileting Toileting: 4: Steadying assist  FIM - Diplomatic Services operational officer Devices: Elevated toilet seat;Grab  bars Toilet Transfers: 4-To toilet/BSC: Min A (steadying Pt. > 75%)  FIM -  Bed/Chair Transport planner Devices: Therapist, occupational: 5: Supine > Sit: Supervision (verbal cues/safety issues);5: Sit > Supine: Supervision (verbal cues/safety issues);4: Bed > Chair or W/C: Min A (steadying Pt. > 75%);4: Chair or W/C > Bed: Min A (steadying Pt. > 75%)  FIM - Locomotion: Wheelchair Locomotion: Wheelchair: 5: Travels 150 ft or more: maneuvers on rugs and over door sills with supervision, cueing or coaxing FIM - Locomotion: Ambulation Locomotion: Ambulation Assistive Devices: Designer, industrial/product Ambulation/Gait Assistance: 4: Min assist Locomotion: Ambulation: 1: Travels less than 50 ft with minimal assistance (Pt.>75%)  Comprehension Comprehension Mode: Auditory;Visual Comprehension: 6-Follows complex conversation/direction: With extra time/assistive device  Expression Expression Mode: Verbal Expression: 6-Expresses complex ideas: With extra time/assistive device  Social Interaction Social Interaction: 6-Interacts appropriately with others with medication or extra time (anti-anxiety, antidepressant).  Problem Solving Problem Solving: 6-Solves complex problems: With extra time  Memory Memory: 5-Recognizes or recalls 90% of the time/requires cueing < 10% of the time  1. Deconditioning secondary to diverticulitis/chronic obstructive pulmonary disease, respiratory failure  2. DVT Prophylaxis/Anticoagulation: Chronic Coumadin therapy for atrial fibrillation. Adjusting coumadin to therapeutic level. Watch hgb closely given supratherapeutic values 3. COPD. Chronic oxygen therapy. Continue nebulizer treatments as well as chronic steroid therapy. Follow pulmonary services as needed. Check oxygen saturations every shift. Activity tolerance better overall. 4. Chronic renal insufficiency. Push po fluids. .   -recheck bmet bette today.  -recheck bmet on monday 5. Congestive  heart failure.  Increased diuretics. Legs noticeably less edematous  -need to check weights daily  -bmet today better. Can go home and push po fluids a little  -cxr stable last week 6. Hyperglycemia. Felt to be related to steroids. No documentation of diabetes mellitus. Adjust insulin upwards 7. Hypertension/atrial fibrillation. Metoprolol 12.5 mg twice daily. Monitor the increased mobility  8. Right lower extremity hematoma/skin tear. Use gauze to absorb any drainage. 9. Chronic anemia. Transfused one unit 05/18/2010. hgb trending up 10. Clostridium difficile. Flagyl completed. Contact precautions. No diarrhea 11. Leukocytosis: steroid effect.  -dc'ed primaxin  -afebrile  12.  Critical illness myopathy proximal> distal weakness all 4 limbs deltoid strength improving  LOS (Days) 13 A FACE TO FACE EVALUATION WAS PERFORMED  Saban Heinlen T 06/01/2011, 8:37 AM

## 2011-06-01 NOTE — Progress Notes (Signed)
ANTICOAGULATION CONSULT NOTE - FOLLOW UP  Pharmacy Consult for Coumadin Indication: afib  Allergies  Allergen Reactions  . Sulfonamide Derivatives     Childhood reaction    Patient Measurements: Height: 5\' 3"  (160 cm) Weight: 192 lb 0.3 oz (87.1 kg) IBW/kg (Calculated) : 52.4     Vital Signs: Temp: 98.4 F (36.9 C) (02/20 0543) Temp src: Oral (02/20 0543) BP: 121/78 mmHg (02/20 0821) Pulse Rate: 69  (02/20 0821)  Labs:  Basename 06/01/11 0601 05/31/11 0655 05/30/11 0730  HGB -- -- --  HCT -- -- --  PLT -- -- --  APTT -- -- --  LABPROT 28.8* 29.3* 29.4*  INR 2.66* 2.72* 2.73*  HEPARINUNFRC -- -- --  CREATININE 1.34* 1.49* 1.70*  CKTOTAL -- -- --  CKMB -- -- --  TROPONINI -- -- --   Estimated Creatinine Clearance: 36.2 ml/min (by C-G formula based on Cr of 1.34).  Assessment: INR therapeutic today- appears stable on 1mg  dose for time being. No bleeding noted. Noted plans to d/c home today with INR check on 2/22.   Goal of Therapy:  INR 2-3   Plan:  1. Noted plans home on Coumadin 1 mg daily with f/u INR 2/22.   Lavonia Dana, PharmD Clinical pharmacist, pager [phone] 06/01/2011,8:59 AM

## 2011-06-01 NOTE — Progress Notes (Signed)
Physical Therapy Discharge Summary and Treatment  Patient Details  Name: [name] MRN: [id number] Date of Birth: 1930-04-24 Today's Date: 06/01/2011  Patient has met 6 of 7 long term goals due to improved activity tolerance, improved balance, improved postural control and increased strength.  Patient to discharge at a wheelchair level S to min A, short distance ambulation with RW.   Patient's care partner is independent to provide the necessary physical assistance at discharge.  Reasons goals not met: pt unable to consistently gait distance of 50 ft d/t impaired activity tolerance but did make significant improvments in this area and d/c on only 2L O2  Recommendation:  Patient will benefit from ongoing skilled PT services in home health setting to continue to advance safe functional mobility, address ongoing impairments in strength, activity tolerance, balance and minimize fall risk.  Equipment: light weight w/c, pressure relieving cushion (SW states pt's dtr wants this and willing to private pay), slide board ordered by daughter but pt declined it when it arrived, daughter aware  Reasons for discharge: discharge from hospital  Patient/family agrees with progress made and goals achieved: Yes  PT Discharge Precautions/Restrictions Precautions Precautions: Fall Precaution Comments: 2L O50falls, 2L O2 Vital Signs Pain Pain Assessment Pain Assessment: No/denies pain Vision/Perception    WFL Cognition Overall Cognitive Status: Appears within functional limits for tasks assessed Sensation Sensation Light Touch: Appears Intact Proprioception: Appears Intact     Balance Static Sitting Balance Static Sitting - Balance Support: Feet supported Static Sitting - Level of Assistance: 7: Independent Dynamic Sitting Balance Dynamic Sitting - Level of Assistance: 6: Modified independent (Device/Increase time) Static Standing Balance Static Standing - Balance Support: Bilateral  upper extremity supported Static Standing - Level of Assistance: 5: Stand by assistance Dynamic Standing Balance Dynamic Standing - Level of Assistance: 5: Stand by assistance (with 1 UE support,minimal weight shifts) Extremity Assessment      RLE AROM (degrees) RLE Overall AROM Comments: WFL RLE Strength RLE Overall Strength Comments:  hip 3-,3/5, knee 3+/5, ankle DF 3+/5, PF 3/5, feet edemous LLE AROM (degrees) Overall AROM Left Lower Extremity: Within functional limits for tasks assessed LLE Strength LLE Overall Strength Comments: hip- 3/3/5, 3/5, knee 3+/5 ankle 3/5 PF, 3+/5 DF, feet edemous  See FIM for current functional status  Skilled intervention today  TIME IN/OUT  1510-1605 Individual therapy Denied pain Family education with pt and daughter, daughter return demonstrated assisting pt with all mobility including car transfer stand pivot with RW to real car.  Pt and daughter verbalize understanding of positioning in w/c, pressure relief and cushion care of J2 cushion which is what they requested. Pt d/c home. Michaelene Song 06/01/2011, 4:22 PM

## 2011-06-01 NOTE — Progress Notes (Signed)
Inpatient Diabetes Program Recommendations  AACE/ADA: New Consensus Statement on Inpatient Glycemic Control (2009)  Target Ranges:  Prepandial:   less than 140 mg/dL      Peak postprandial:   less than 180 mg/dL (1-2 hours)      Critically ill patients:  140 - 180 mg/dL   Reason for Visit: Mild fasting hypoglycemia  Inpatient Diabetes Program Recommendations Insulin - Basal: Recommend to decrease Lantus to 8 units at HS while patient having fasting mild hypoglycemia .  Note: Thank you, Lenor Coffin, RN, CNS, Diabetes Coordinator [phone])

## 2011-06-01 NOTE — Discharge Planning (Signed)
Social Work  Discharge Note  The overall goal for the admission was met for:   Discharge location: Yes - Home with daughter and son-in-law  Length of Stay: Yes - 13 days  Discharge activity level: Yes - minimum assistance overall  Home/community participation: Yes  Services provided included: MD, RD, PT, OT, SLP, RN, CM, TR, Pharmacy and SW  Financial Services: Medicare  Follow-up services arranged: Home Health: RN, PT and OT via [facility], DME: 20x18 [employer] wc, Jay 2 cushion, drop arm commode, tf board - [facility] and Patient/Family has no preference for HH/DME agencies  Comments (or additional information):  Patient/Family verbalized understanding of follow-up arrangements: Yes  Individual responsible for coordination of the follow-up plan: daughter  Confirmed correct DME delivered: Amada [place] 06/01/2011    Manasa Spease

## 2011-06-02 NOTE — Telephone Encounter (Signed)
LMOM for her family to call back to schedule an appt with you, and with the coumadin clinic

## 2011-06-02 NOTE — Telephone Encounter (Signed)
assist

## 2011-06-02 NOTE — Telephone Encounter (Signed)
Left message for patient family to call and schedule appt.

## 2011-06-03 ENCOUNTER — Telehealth: Payer: Self-pay | Admitting: Radiology

## 2011-06-03 NOTE — Telephone Encounter (Signed)
Spoke with the home health nurse taking care of the patient, nurses number is [phone]. I asked her to relay to the patients family to call and schedule a f/u appt with Dr Patsy Lager for 2 weeks.

## 2011-06-03 NOTE — Telephone Encounter (Signed)
INR 2.0, pt 23.7. Taking 1 mg daily. Home health nurse called result., I told then to continue current dose and recheck in 2 weeks. Nurse # [phone]

## 2011-06-07 NOTE — Telephone Encounter (Signed)
Terri spoke with home health nurse and she is going to have family call and schedule appt

## 2011-06-08 ENCOUNTER — Encounter: Payer: Self-pay | Admitting: Family Medicine

## 2011-06-10 ENCOUNTER — Telehealth: Payer: Self-pay | Admitting: *Deleted

## 2011-06-10 NOTE — Telephone Encounter (Signed)
Change med list to the following: 1 mg of coumadin a day except for 1.5 mg on Sunday.  Recheck INR 1 week.  Please call in.

## 2011-06-10 NOTE — Telephone Encounter (Signed)
LMOVM of [name]'s phone, asking her to call back to signify that she has received and understands the instructions and also if I need to notify anyone at the patient's home.

## 2011-06-10 NOTE — Telephone Encounter (Signed)
Larita Fife confirmed receiving the call, understanding instructions and passing instructions along to family members.

## 2011-06-10 NOTE — Telephone Encounter (Signed)
To: [facility] (Daytime Triage) Fax: [phone] From: [facility] Date/ Time: 06/10/2011 2:11 PM Taken By: Ezra Sites, RN Caller: Larita Fife Facility: not collected Patient: [name], [name] DOB: 24-Jul-1930 Phone: [phone] Reason for Call: PT/INR 16. 6/1. 4; currently taking 1mg  coumadin qd. Patient callback number [phone] home, or [phone] cell/daughter. May reach [facility] at [phone]. Regarding Appointment: Appt Date: Appt Time: Unknown Provider: Reason: Details: Outcome:

## 2011-06-10 NOTE — Telephone Encounter (Signed)
Nurse- Daryl Eastern called and left message on Lab voicemail to return her call re: today's home health INR result. She did not leave result in message. I returned call and left message to call back and give information to triage if I am no longer in office.

## 2011-06-17 ENCOUNTER — Telehealth: Payer: Self-pay | Admitting: Radiology

## 2011-06-17 NOTE — Telephone Encounter (Signed)
Home Health called patient INR 1.2, increased dose  From  1 mg daily, Sunday 1.5 mg. To 1 mg daily, except MWF 2 mg. Re check in 1 week

## 2011-06-17 NOTE — Telephone Encounter (Signed)
That sounds reasonable Will forward to Dr Patsy Lager

## 2011-06-18 NOTE — Telephone Encounter (Signed)
Agree that is a reasonable plan

## 2011-06-24 ENCOUNTER — Telehealth: Payer: Self-pay | Admitting: *Deleted

## 2011-06-24 DIAGNOSIS — Z0271 Encounter for disability determination: Secondary | ICD-10-CM

## 2011-06-24 LAB — PROTIME-INR: INR: 1.5 — AB (ref 0.9–1.1)

## 2011-06-24 NOTE — Telephone Encounter (Signed)
PT 1.5, Inr 17.9, pt taking 5 mg M,W,F 2.5 mg daily.   Called nurse back, left message for her to return call, pt's on different dose per our records.

## 2011-06-24 NOTE — Telephone Encounter (Signed)
Pt called back confirmed pt on dose in our records.  Per Dr pt is to take 2 mg daily, except 1 mg Tue, Thurs, recheck 1 week.  Message with instructions left of nurse's voicemail.

## 2011-06-30 ENCOUNTER — Ambulatory Visit (INDEPENDENT_AMBULATORY_CARE_PROVIDER_SITE_OTHER): Payer: Medicare Other | Admitting: Family Medicine

## 2011-06-30 ENCOUNTER — Encounter: Payer: Self-pay | Admitting: Family Medicine

## 2011-06-30 VITALS — BP 120/72 | HR 74 | Temp 97.8°F | Ht 63.0 in | Wt 192.8 lb

## 2011-06-30 DIAGNOSIS — M1A00X0 Idiopathic chronic gout, unspecified site, without tophus (tophi): Secondary | ICD-10-CM

## 2011-06-30 DIAGNOSIS — D638 Anemia in other chronic diseases classified elsewhere: Secondary | ICD-10-CM

## 2011-06-30 DIAGNOSIS — I509 Heart failure, unspecified: Secondary | ICD-10-CM

## 2011-06-30 DIAGNOSIS — J9610 Chronic respiratory failure, unspecified whether with hypoxia or hypercapnia: Secondary | ICD-10-CM

## 2011-06-30 DIAGNOSIS — Z9981 Dependence on supplemental oxygen: Secondary | ICD-10-CM

## 2011-06-30 DIAGNOSIS — M1A30X0 Chronic gout due to renal impairment, unspecified site, without tophus (tophi): Secondary | ICD-10-CM

## 2011-06-30 DIAGNOSIS — S81801A Unspecified open wound, right lower leg, initial encounter: Secondary | ICD-10-CM

## 2011-06-30 DIAGNOSIS — N189 Chronic kidney disease, unspecified: Secondary | ICD-10-CM

## 2011-06-30 DIAGNOSIS — E119 Type 2 diabetes mellitus without complications: Secondary | ICD-10-CM

## 2011-06-30 DIAGNOSIS — N184 Chronic kidney disease, stage 4 (severe): Secondary | ICD-10-CM

## 2011-06-30 DIAGNOSIS — R5381 Other malaise: Secondary | ICD-10-CM

## 2011-06-30 DIAGNOSIS — I5032 Chronic diastolic (congestive) heart failure: Secondary | ICD-10-CM

## 2011-06-30 DIAGNOSIS — S81009A Unspecified open wound, unspecified knee, initial encounter: Secondary | ICD-10-CM

## 2011-06-30 DIAGNOSIS — I4891 Unspecified atrial fibrillation: Secondary | ICD-10-CM

## 2011-06-30 DIAGNOSIS — K5712 Diverticulitis of small intestine without perforation or abscess without bleeding: Secondary | ICD-10-CM

## 2011-06-30 DIAGNOSIS — A0472 Enterocolitis due to Clostridium difficile, not specified as recurrent: Secondary | ICD-10-CM

## 2011-06-30 MED ORDER — IPRATROPIUM BROMIDE 0.02 % IN SOLN: 500.0000 ug | Freq: Four times a day (QID) | RESPIRATORY_TRACT | Status: DC

## 2011-06-30 MED ORDER — TRAMADOL HCL 50 MG PO TABS: 50.0000 mg | ORAL_TABLET | Freq: Four times a day (QID) | ORAL | Status: DC | PRN

## 2011-06-30 MED ORDER — BECLOMETHASONE DIPROPIONATE 80 MCG/ACT IN AERS: 2.0000 | INHALATION_SPRAY | Freq: Two times a day (BID) | RESPIRATORY_TRACT | Status: DC

## 2011-06-30 NOTE — Patient Instructions (Addendum)
F/u 2 months with Dr. Patsy Lager  Decrease prednisone to 2 1/2 tablets for 3 weeks Then 2 tabs for 3 weeks Then 1 1/2 tabs for 3 weeks Then 1 tab for 3 weeks

## 2011-06-30 NOTE — Progress Notes (Signed)
Patient Name: [name] Date of Birth: 1930/11/09 Age: 78 y.o. Medical Record Number: [id number] Gender: female Date of Encounter: 06/30/2011  History of Present Illness:  [name] is a 78 y.o. very pleasant female patient who presents with the following:  Complex medical patient with a history of chronic obstructive pulmonary disease, oxygen dependent, atrial fibrillation on chronic Coumadin, diastolic heart failure, hypothyroidism, peripheral vascular disease, severe venous reflux disease, insulin-dependent diabeteswho has also been historically somewhat noncompliant and had some repetitive hospitalizations. She is here in followup for a 2 month hospitalization in the hospital as well as rehabilitation.  She was initially admitted with acute diverticulitis of the jejunum.Marland Kitchen Her hospitalization was complicated by acute respiratory failure and distress, and she was innovated and in the intensive care unit twice. She also had some C. Difficile colitis. Additionally, she had an acute gout flare that was quite significant, and the patient was placed on oral prednisone. She currently is still taking 30 mg of prednisone daily.  COPD:she is not wearing her oxygen currently. Pulse ox is 89%. She ran out of her Spiriva due to some financial constraints. She has been doing some ipratropium nebulizer treatments as well as albuterol while not having the spiriva.  Her creatinine is actually improved, and her last creatinine was 1.3.  Diverticulitis s/p hosp, prolonged x 2 months Now on Prednisone 30 mg  Renae Gloss is a small ulcer on her RIGHT anterior lateral shin. Her daughter is an Charity fundraiser, and they've been treating these with wet-to-dry, and also sometimes with Xeroform. It has been improving and is not red or hot.  All hospital records reviewed.  Comprehensive Metabolic Panel:    Component Value Date/Time   NA 141 06/01/2011 0601   K 4.3 06/01/2011 0601   CL 101 06/01/2011 0601   CO2 35*  06/01/2011 0601   BUN 71* 06/01/2011 0601   CREATININE 1.34* 06/01/2011 0601   CREATININE 2.65* 06/18/2010 1715   GLUCOSE 67* 06/01/2011 0601   CALCIUM 9.0 06/01/2011 0601   AST 8 05/20/2011 0650   ALT 10 05/20/2011 0650   ALKPHOS 58 05/20/2011 0650   BILITOT 0.5 05/20/2011 0650   PROT 6.1 05/20/2011 0650   ALBUMIN 2.2* 05/20/2011 0650   Lab Results  Component Value Date   HGBA1C 6.3* 05/18/2011   CBC:    Component Value Date/Time   WBC 9.6 05/27/2011 0655   HGB 9.0* 05/27/2011 0655   HCT 30.1* 05/27/2011 0655   PLT 269 05/27/2011 0655   MCV 91.8 05/27/2011 0655   NEUTROABS 10.5* 05/20/2011 0650   LYMPHSABS 1.7 05/20/2011 0650   MONOABS 0.6 05/20/2011 0650   EOSABS 0.0 05/20/2011 0650   BASOSABS 0.0 05/20/2011 0650   BNP (last 3 results)  Basename 04/28/11 0901 04/25/11 0730 04/08/11 0430  PROBNP 1681.0* 1220.0* 24310.0*   Patient Active Problem List  Diagnoses  . HYPOTHYROIDISM  . Peripheral vascular disease in diabetes mellitus  . OVERWEIGHT/OBESITY  . OBSTRUCTIVE SLEEP APNEA  . HYPERTENSION, UNSPECIFIED  . Atrial fibrillation  . DIASTOLIC HEART FAILURE, CHRONIC  . COPD with emphysema  . RENAL FAILURE, CHRONIC  . SLEEP APNEA  . HYPERLIPIDEMIA-MIXED  . Fatigue  . Osteoarthritis  . Venous reflux  . Bradycardia  . Diverticulitis of jejunum  . Hyperkalemia  . C. difficile colitis  . Physical deconditioning  . COPD with acute exacerbation  . Oxygen dependent  . Respiratory failure, chronic  . Chronic gout due to renal impairment   Past Medical History  Diagnosis Date  . Diabetes mellitus   . Obesity   . COPD (chronic obstructive pulmonary disease)     Moderate. PFTs (12/10): FVC 67%, FEV1 58%, ratio 60%, TLC 95%, RV 138%, DLCO 43% (moderate obstructive defect). She is on home oxygen that should be worn at all times.  . Diastolic CHF, chronic     Echo (3/12): EF 55-60%, mild LV hypertrophy, grade I diastolic dysfunction, mild left atrial enlargement, normal pulmonary artery pressure  .  CKD (chronic kidney disease)     Cr 2.4 when last checked. 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 PNA (pneumonia)     h/o with ARDS in 2005; MRSA PNA with prolonged hospitalization in [facility] 11/11  . Diverticulum     Jejunal  . OSA (obstructive sleep apnea)     possible. When pt was sedated for TEE in May 2010, she did develop some upper airway obstruction  . Anemia     of chronic disease  . Hypothyroidism   . Gout   . Atrial fibrillation 07/2008    Failed cardioversion with TEE guidance in May 2010. Pt went back to NSR by 11/2008 with amiodarne  . Hypertension   . GERD (gastroesophageal reflux disease)   . Peripheral vascular disease   . Pneumonia   . Chronic kidney disease   . Respiratory failure, chronic 07/01/2011  . Chronic gout due to renal impairment 07/01/2011   Past Surgical History  Procedure Date  . Tracheostomy     ARDS/ ICU admission, trach 2005  . Microperforation 08/2008    Of jejunem, ICU admission   History  Substance Use Topics  . Smoking status: Former Smoker -- 1.0 packs/day for 23 years    Quit date: 04/11/1976  . Smokeless tobacco: Not on file   Comment: Started at age 51 (quit at age 74)  . Alcohol Use: No   Family History  Problem Relation Age of Onset  . Emphysema Mother     and asthma  . Asthma Mother   . Heart disease Neg Hx     premature   Allergies  Allergen Reactions  . Sulfonamide Derivatives     Childhood reaction   Current Outpatient Prescriptions on File Prior to Visit  Medication Sig Dispense Refill  . albuterol (PROVENTIL) (2.5 MG/3ML) 0.083% nebulizer solution Take 2.5 mg by nebulization every 6 (six) hours as needed.      . furosemide (LASIX) 40 MG tablet Take 0.5 tablets (20 mg total) by mouth daily.  60 tablet  1  . hydrALAZINE (APRESOLINE) 50 MG tablet Take 1 tablet (50 mg total) by mouth every 8 (eight) hours.  90 tablet  1  . insulin aspart (NOVOLOG) 100 UNIT/ML injection Inject 0-15  Units into the skin every 6 (six) hours.      . insulin glargine (LANTUS) 100 UNIT/ML injection Inject 20 Units into the skin at bedtime.  10 mL  1  . metoprolol tartrate (LOPRESSOR) 25 MG tablet Take 0.5 tablets (12.5 mg total) by mouth 2 (two) times daily.  60 tablet  1  . Multiple Vitamin (MULITIVITAMIN WITH MINERALS) TABS Take 1 tablet by mouth daily.      . potassium chloride SA (K-DUR,KLOR-CON) 20 MEQ tablet Take 1 tablet (20 mEq total) by mouth daily.  30 tablet  1  . ranitidine (ZANTAC) 150 MG capsule Take 150 mg by mouth every evening.        . simvastatin (ZOCOR) 20 MG tablet Take 1  tablet (20 mg total) by mouth daily at 6 PM.  30 tablet  1  . tiotropium (SPIRIVA) 18 MCG inhalation capsule Place 18 mcg into inhaler and inhale daily.      Marland Kitchen warfarin (COUMADIN) 1 MG tablet Take 1 tablet (1 mg total) by mouth every morning.  30 tablet  0     Past Medical History, Surgical History, Social History, Family History, Problem List, Medications, and Allergies have been reviewed and updated if relevant.  Review of Systems: Shortness of breath at baseline. Significant physical deconditioning. Fatigue. Lower extremity edema. No current gout pain. No chest pain. No abdominal pain.  Otherwise, the pertinent positives and negatives are listed above and in the HPI, otherwise a full review of systems has been reviewed and is negative unless noted positive.   Physical Examination: Filed Vitals:   06/30/11 1603  BP: 120/72  Pulse: 74  Temp: 97.8 F (36.6 C)  TempSrc: Oral  Height: 5\' 3"  (1.6 m)  Weight: 192 lb 12.8 oz (87.454 kg)  SpO2: 89%    Body mass index is 34.15 kg/(m^2).   GEN: WDWN, NAD, Non-toxic, A & O x 3. Face mildly cushingoid HEENT: Atraumatic, Normocephalic. Neck supple. No masses, No LAD. Ears and Nose: No external deformity. CV: irreg, irreg PULM: CTA B, no wheezes, crackles, rhonchi. No retractions. No resp. distress. No accessory muscle use. EXTR: 1+ lower extremily  edema. Diffuse venous stasis changes. SKIN: 1 cm ulcer through the dermal layer and into the fat on the R anterolateral shin. No redness or warmth surrounding. NEURO in a wheelchair PSYCH: Normally interactive. Conversant. Not depressed or anxious appearing.  Calm demeanor.    Assessment and Plan:  1. CKD (chronic kidney disease), stage IV  Basic metabolic panel  2. Anemia, chronic disease  CBC with Differential  3. Physical deconditioning : working with PT now   4. C. difficile colitis    5. Diverticulitis of jejunum : improved   6. RENAL FAILURE, CHRONIC    7. DIASTOLIC HEART FAILURE, CHRONIC : on betablocker and lasix. asx now   8. Atrial fibrillation : cont chronic coumadin   9. DIABETES MELLITUS, TYPE II. Last a1c has been good   10. Leg wound, right : reviewed with daughter care, improving. If worsens, daughter, RN, is to call us, and we will consult wound care   11. Oxygen dependent : again discussed importance of oxygen use   12. Respiratory failure, chronic    13. Chronic gout due to renal impairment     >40 minutes spent in face to face time with patient, >50% spent in counselling or coordination of care: all spent in discussion and evaluation with the patient face-to-face, record review while she is here in the office, and discussion with the patient's daughter. Reviewed her extensive hospitalization history. We also discussed her CODE STATUS, and she would like to remain a full code.  Reviewed with plans for discontinuation of prednisone and a very long taper given her use now for several months.  Recheck 2 months  Orders Today: Orders Placed This Encounter  Procedures  . Basic metabolic panel  . CBC with Differential    Medications Today: Meds ordered this encounter  Medications  . warfarin (COUMADIN) 2 MG tablet    Sig: Take 2 mg by mouth daily.  Marland Kitchen DISCONTD: traMADol (ULTRAM) 50 MG tablet    Sig: Take 50 mg by mouth every 6 (six) hours as needed.  Marland Kitchen aspirin 81  MG tablet  Sig: Take 81 mg by mouth daily.  . beclomethasone (QVAR) 80 MCG/ACT inhaler    Sig: Inhale 2 puffs into the lungs 2 (two) times daily.    Dispense:  1 Inhaler    Refill:  12  . ipratropium (ATROVENT) 0.02 % nebulizer solution    Sig: Take 2.5 mLs (500 mcg total) by nebulization 4 (four) times daily.    Dispense:  75 mL    Refill:  12  . traMADol (ULTRAM) 50 MG tablet    Sig: Take 1 tablet (50 mg total) by mouth every 6 (six) hours as needed.    Dispense:  50 tablet    Refill:  5  . predniSONE (DELTASONE) 10 MG tablet    Sig: Use as directed    Dispense:  1 tablet    Refill:  0

## 2011-07-01 ENCOUNTER — Encounter: Payer: Self-pay | Admitting: Family Medicine

## 2011-07-01 ENCOUNTER — Telehealth: Payer: Self-pay | Admitting: Radiology

## 2011-07-01 DIAGNOSIS — M1A30X0 Chronic gout due to renal impairment, unspecified site, without tophus (tophi): Secondary | ICD-10-CM

## 2011-07-01 DIAGNOSIS — Z9981 Dependence on supplemental oxygen: Secondary | ICD-10-CM | POA: Insufficient documentation

## 2011-07-01 DIAGNOSIS — J9610 Chronic respiratory failure, unspecified whether with hypoxia or hypercapnia: Secondary | ICD-10-CM

## 2011-07-01 HISTORY — DX: Chronic gout due to renal impairment, unspecified site, without tophus (tophi): M1A.30X0

## 2011-07-01 HISTORY — DX: Chronic respiratory failure, unspecified whether with hypoxia or hypercapnia: J96.10

## 2011-07-01 LAB — CBC WITH DIFFERENTIAL/PLATELET
Basophils Absolute: 0 10*3/uL (ref 0.0–0.1)
Basophils Relative: 0.1 % (ref 0.0–3.0)
Eosinophils Absolute: 0 10*3/uL (ref 0.0–0.7)
Eosinophils Relative: 0.1 % (ref 0.0–5.0)
HCT: 31.7 % — ABNORMAL LOW (ref 36.0–46.0)
Hemoglobin: 10.2 g/dL — ABNORMAL LOW (ref 12.0–15.0)
Lymphocytes Relative: 5.6 % — ABNORMAL LOW (ref 12.0–46.0)
Lymphs Abs: 0.9 10*3/uL (ref 0.7–4.0)
MCHC: 32.3 g/dL (ref 30.0–36.0)
MCV: 90.5 fl (ref 78.0–100.0)
Monocytes Absolute: 0.1 10*3/uL (ref 0.1–1.0)
Monocytes Relative: 0.5 % — ABNORMAL LOW (ref 3.0–12.0)
Neutro Abs: 15.7 10*3/uL — ABNORMAL HIGH (ref 1.4–7.7)
Neutrophils Relative %: 93.7 % — ABNORMAL HIGH (ref 43.0–77.0)
Platelets: 242 10*3/uL (ref 150.0–400.0)
RBC: 3.5 Mil/uL — ABNORMAL LOW (ref 3.87–5.11)
RDW: 19.2 % — ABNORMAL HIGH (ref 11.5–14.6)
WBC: 16.8 10*3/uL — ABNORMAL HIGH (ref 4.5–10.5)

## 2011-07-01 LAB — BASIC METABOLIC PANEL
Calcium: 9.2 mg/dL (ref 8.4–10.5)
GFR: 27.1 mL/min — ABNORMAL LOW (ref >60.00)
Potassium: 4.7 mEq/L (ref 3.5–5.1)
Sodium: 139 mEq/L (ref 135–145)

## 2011-07-01 NOTE — Telephone Encounter (Signed)
INR today per home health nurse, 1.9. Instructed nurse to continue 2 mg daily, 1 mg every Tu/TH, recheck in 2 weeks.

## 2011-07-01 NOTE — Telephone Encounter (Signed)
That is fine 

## 2011-07-05 ENCOUNTER — Other Ambulatory Visit: Payer: Self-pay | Admitting: Family Medicine

## 2011-07-05 MED ORDER — CEPHALEXIN 500 MG PO CAPS: 500.0000 mg | ORAL_CAPSULE | Freq: Two times a day (BID) | ORAL | Status: AC

## 2011-07-06 ENCOUNTER — Encounter: Payer: Self-pay | Admitting: Physical Medicine & Rehabilitation

## 2011-07-07 ENCOUNTER — Encounter: Payer: Self-pay | Admitting: Physical Medicine & Rehabilitation

## 2011-07-11 ENCOUNTER — Telehealth: Payer: Self-pay | Admitting: Family Medicine

## 2011-07-11 NOTE — Telephone Encounter (Signed)
Barbara Cower with [facility] called, request a Verbal order to extend patient Physical Therapy for an additional 2 weeks.  Call back # [phone].Marland KitchenMarland Kitchen

## 2011-07-11 NOTE — Telephone Encounter (Signed)
Please extend for 2 weeks

## 2011-07-12 NOTE — Telephone Encounter (Signed)
Called Barbara Cower w/ [facility], left message approving the extended time for Physical Therapy x 2 weeks.

## 2011-07-15 ENCOUNTER — Telehealth: Payer: Self-pay | Admitting: *Deleted

## 2011-07-15 NOTE — Telephone Encounter (Signed)
Todays Inr is 1.4, protime 16.4  Is currently taking 1 mg tues, thurs, 2 mg daily.  I advised increase to 2 mg daily recheck 1 week

## 2011-07-15 NOTE — Telephone Encounter (Signed)
Agreed -

## 2011-07-22 ENCOUNTER — Telehealth: Payer: Self-pay | Admitting: Radiology

## 2011-07-22 NOTE — Telephone Encounter (Signed)
That sounds good- thanks - will sign off on that

## 2011-07-22 NOTE — Telephone Encounter (Signed)
INR today 1.4/16.4, no change from last week. Dose is 2 mg daily, I changed dose today to , 2 mg daily except 3 mg MWF recheck in 1 week

## 2011-07-25 ENCOUNTER — Telehealth: Payer: Self-pay | Admitting: Physical Medicine & Rehabilitation

## 2011-07-25 NOTE — Telephone Encounter (Signed)
Okay to refill? 

## 2011-07-25 NOTE — Telephone Encounter (Signed)
Needs refill on Prednisone.

## 2011-07-25 NOTE — Telephone Encounter (Signed)
This needs to be refilled by her pulmonary doctor. She left some time ago from rehab, and i don't believe there were any plans for me to see her in follow up.

## 2011-07-26 ENCOUNTER — Other Ambulatory Visit: Payer: Self-pay | Admitting: *Deleted

## 2011-07-26 ENCOUNTER — Telehealth: Payer: Self-pay

## 2011-07-26 DIAGNOSIS — E1151 Type 2 diabetes mellitus with diabetic peripheral angiopathy without gangrene: Secondary | ICD-10-CM

## 2011-07-26 DIAGNOSIS — L97909 Non-pressure chronic ulcer of unspecified part of unspecified lower leg with unspecified severity: Secondary | ICD-10-CM

## 2011-07-26 MED ORDER — PREDNISONE 10 MG PO TABS: 30.0000 mg | ORAL_TABLET | Freq: Every day | ORAL | Status: DC

## 2011-07-26 NOTE — Telephone Encounter (Signed)
Done, as below

## 2011-07-26 NOTE — Telephone Encounter (Signed)
Lm with pharmacy letting them know that Dr. Riley Kill will not be refilling her Prednisone.

## 2011-07-26 NOTE — Telephone Encounter (Signed)
[name] said pt has venous statis ulcer on leg and a new wound which appears to be an abscess which is draining has formed beside the known ulcer on leg. [name] request referral to Wound Clinic in [place]( Received referral request from [facility]; given to Heather)Pt already has appt scheduled at [facility] on [facility] 07/28/11 at 1:30pm. [name] can be reached at [phone].

## 2011-07-29 ENCOUNTER — Telehealth: Payer: Self-pay | Admitting: Radiology

## 2011-07-29 NOTE — Telephone Encounter (Signed)
Home health nurse called results of INR 2.4, dose 3 mg MWF, 2 mg T/TH/S/S. Patient started antibiotics yesterday (reduced dose to 2 mg daily while taking) will check INR Monday,home health will be discharging the patient then.

## 2011-08-01 ENCOUNTER — Telehealth: Payer: Self-pay | Admitting: Radiology

## 2011-08-01 NOTE — Telephone Encounter (Signed)
Patient currently on Cipro, has been running low, ok to wait until 08/11/2011 to recheck.

## 2011-08-01 NOTE — Telephone Encounter (Signed)
Home health nurse called , 1.3/15.7. Patient 's dose was reduced last week due to antibiotic use. I changed her dose back to 2 mg daily, except 3 mg MWF. Home health has discharged her today. Patient's daughter will bring her on May 2 for another INR check.

## 2011-08-01 NOTE — Telephone Encounter (Signed)
Agree 

## 2011-08-11 ENCOUNTER — Telehealth: Payer: Self-pay | Admitting: Family Medicine

## 2011-08-11 ENCOUNTER — Ambulatory Visit: Payer: Medicare Other

## 2011-08-11 NOTE — Telephone Encounter (Signed)
Called and discussed the case with patient's daughter, Danton Sewer, PA, who called with the patient's INR of 2.0 I discussed with her that from a risk stand-point, with Coumadin, while she was not in the immediate area, and she was living with her daughter, given her complex history, someone needed to be managing her Coumadin dosing directly, and direct POC INR checks would be ideal. I suggested that while not in our area, that having an MD who could alter Coumadin dosing at POC most appropriate. When she moves back to our area in the next month or so, we would be more than happy to continue caring for her and monitoring her INR's.

## 2011-08-31 ENCOUNTER — Ambulatory Visit: Payer: Medicare Other | Admitting: Family Medicine

## 2011-09-21 ENCOUNTER — Ambulatory Visit: Payer: Self-pay | Admitting: Family Medicine

## 2011-09-21 DIAGNOSIS — I4891 Unspecified atrial fibrillation: Secondary | ICD-10-CM

## 2012-02-10 ENCOUNTER — Other Ambulatory Visit: Payer: Self-pay | Admitting: Cardiology

## 2012-02-28 ENCOUNTER — Encounter: Payer: Self-pay | Admitting: Nurse Practitioner

## 2012-02-28 ENCOUNTER — Ambulatory Visit (INDEPENDENT_AMBULATORY_CARE_PROVIDER_SITE_OTHER): Payer: Medicare Other | Admitting: Nurse Practitioner

## 2012-02-28 ENCOUNTER — Ambulatory Visit (INDEPENDENT_AMBULATORY_CARE_PROVIDER_SITE_OTHER): Payer: Medicare Other | Admitting: *Deleted

## 2012-02-28 VITALS — BP 126/58 | HR 76 | Ht 63.0 in | Wt 217.4 lb

## 2012-02-28 DIAGNOSIS — R0609 Other forms of dyspnea: Secondary | ICD-10-CM

## 2012-02-28 DIAGNOSIS — R0600 Dyspnea, unspecified: Secondary | ICD-10-CM

## 2012-02-28 DIAGNOSIS — I5031 Acute diastolic (congestive) heart failure: Secondary | ICD-10-CM

## 2012-02-28 DIAGNOSIS — I4891 Unspecified atrial fibrillation: Secondary | ICD-10-CM

## 2012-02-28 LAB — POCT INR: INR: 2.5

## 2012-02-28 MED ORDER — METOLAZONE 5 MG PO TABS: ORAL_TABLET | ORAL | Status: DC

## 2012-02-28 NOTE — Progress Notes (Signed)
[name] Date of Birth: Aug 30, 1930 Medical Record #[id number]  History of Present Illness: [name] is seen today for a work in visit. She is seen for [name]. She was last here in March of 2012. She has multiple issues which include atrial fib, previously on amiodarone, disatolic CHF, CKD previously followed by nephrology, DM, obesity, diverticulitis, anemia of chronic disease, gout and hypothyroidism.   She comes in today. She is here with her daughter, [name], who is an NP down in [place]. She gives most of the history. [name] was hospitalized in [place] last December until February with diverticulitis. She was quite sick. Intubated, etc. She is no longer seeing [name]. Apparently they had a falling out. Sees [name] in [place]. Does not see [name] as well. She never intends to go on dialysis. She was slow to recover but has made it back to her son's house here in [place] in the past 2 weeks. Now needs to reestablish her coumadin care. Her daughter notes that her weight is up. Seems to be more short of breath. Has had periods where she felt she did not need her oxygen but is now wearing all the time. No chest pain. Looks like she is in atrial fib chronically. Off of amiodarone since her last hospitalization. They will reestablish care with [name] as well. Swelling has gotten worse. Apparently had some significant leg ulcers which have finally healed thanks to the wound clinic in [place]. No recent labs. Sleeps in her lift chair at night. Some early satiety noted. Bowels are ok. Not using salt.  Current Outpatient Prescriptions on File Prior to Visit  Medication Sig Dispense Refill  . albuterol (PROVENTIL) (2.5 MG/3ML) 0.083% nebulizer solution Take 2.5 mg by nebulization every 6 (six) hours as needed.      Marland Kitchen aspirin 81 MG tablet Take 81 mg by mouth daily.      . beclomethasone (QVAR) 80 MCG/ACT inhaler Inhale 2 puffs into the lungs 2 (two) times daily.  1  Inhaler  12  . hydrALAZINE (APRESOLINE) 50 MG tablet Take 1 tablet (50 mg total) by mouth every 8 (eight) hours.  90 tablet  1  . ipratropium (ATROVENT) 0.02 % nebulizer solution Take 2.5 mLs (500 mcg total) by nebulization 4 (four) times daily.  75 mL  12  . levothyroxine (SYNTHROID, LEVOTHROID) 50 MCG tablet Take 50 mcg by mouth daily.      . metoprolol tartrate (LOPRESSOR) 25 MG tablet Take 0.5 tablets (12.5 mg total) by mouth 2 (two) times daily.  60 tablet  1  . Multiple Vitamin (MULITIVITAMIN WITH MINERALS) TABS Take 1 tablet by mouth daily.      . ranitidine (ZANTAC) 150 MG capsule Take 150 mg by mouth every evening.        . tiotropium (SPIRIVA) 18 MCG inhalation capsule Place 18 mcg into inhaler and inhale daily.      . traMADol (ULTRAM) 50 MG tablet Take 1 tablet (50 mg total) by mouth every 6 (six) hours as needed.  50 tablet  5  . [DISCONTINUED] furosemide (LASIX) 40 MG tablet Take 0.5 tablets (20 mg total) by mouth daily.  60 tablet  1  . [DISCONTINUED] simvastatin (ZOCOR) 20 MG tablet Take 1 tablet (20 mg total) by mouth daily at 6 PM.  30 tablet  1  . [DISCONTINUED] warfarin (COUMADIN) 1 MG tablet Take 1 tablet (1 mg total) by mouth every morning.  30 tablet  0  .  metolazone (ZAROXOLYN) 5 MG tablet Take one a day for 3 days only.  15 tablet  3  . [DISCONTINUED] albuterol-ipratropium (COMBIVENT) 18-103 MCG/ACT inhaler Inhale 2 puffs into the lungs 2 (two) times daily.      . [DISCONTINUED] colchicine 0.6 MG tablet Take 0.6 mg by mouth daily.      . [DISCONTINUED] famotidine (PEPCID) 20 MG tablet Take 1 tablet (20 mg total) by mouth at bedtime.  30 tablet  1  . [DISCONTINUED] fluticasone (FLOVENT HFA) 110 MCG/ACT inhaler Inhale 2 puffs into the lungs 2 (two) times daily.      . [DISCONTINUED] metoprolol tartrate (LOPRESSOR) 25 MG tablet TAKE ONE TABLET BY MOUTH TWICE DAILY  60 tablet  0  . [DISCONTINUED] warfarin (COUMADIN) 2 MG tablet Take 2 mg by mouth daily.        Allergies    Allergen Reactions  . Sulfonamide Derivatives     Childhood reaction    Past Medical History  Diagnosis Date  . Diabetes mellitus   . Obesity   . COPD (chronic obstructive pulmonary disease)     Moderate. PFTs (12/10): FVC 67%, FEV1 58%, ratio 60%, TLC 95%, RV 138%, DLCO 43% (moderate obstructive defect). She is on home oxygen that should be worn at all times.  . Diastolic CHF, chronic     Echo (3/12): EF 55-60%, mild LV hypertrophy, grade I diastolic dysfunction, mild left atrial enlargement, normal pulmonary artery pressure  . CKD (chronic kidney disease)     Cr 2.4 when last checked. ANCA +, pauci-immune glomerulnephritis followed by [name]  . PNA (pneumonia)     h/o with ARDS in 2005; MRSA PNA with prolonged hospitalization in [facility] 11/11  . Diverticulum     Jejunal  . OSA (obstructive sleep apnea)     possible. When pt was sedated for TEE in May 2010, she did develop some upper airway obstruction  . Anemia     of chronic disease  . Hypothyroidism   . Gout   . Atrial fibrillation 07/2008    Failed cardioversion with TEE guidance in May 2010. Pt went back to NSR by 11/2008 with amiodarne  . Hypertension   . GERD (gastroesophageal reflux disease)   . Peripheral vascular disease   . Pneumonia   . Chronic kidney disease   . Respiratory failure, chronic 07/01/2011  . Chronic gout due to renal impairment 07/01/2011    Past Surgical History  Procedure Date  . Tracheostomy     ARDS/ ICU admission, trach 2005  . Microperforation 08/2008    Of jejunem, ICU admission    History  Smoking status  . Former Smoker -- 1.0 packs/day for 23 years  . Quit date: 04/11/1976  Smokeless tobacco  . Not on file    Comment: Started at age 23 (quit at age 48)    History  Alcohol Use No    Family History  Problem Relation Age of Onset  . Emphysema Mother     and asthma  . Asthma Mother   . Heart disease Neg Hx     premature    Review of Systems: The review  of systems is per the HPI.  All other systems were reviewed and are negative.  Physical Exam: BP 126/58  Pulse 76  Ht 5\' 3"  (1.6 m)  Wt 217 lb 6.4 oz (98.612 kg)  BMI 38.51 kg/m2 Patient is very pleasant and in no acute distress. She does look chronically ill. She is in a  wheelchair. Has oxygen in place. Skin is warm and dry. Color is normal.  HEENT is unremarkable. Normocephalic/atraumatic. PERRL. Sclera are nonicteric. Neck is supple. No masses. No JVD. Lungs have crackles in the bases. Cardiac exam shows an irregular rhythm. Rate is controlled. Abdomen is firm and protruberant. Extremities are with over 2+ edema. Gait is not tested. ROM appears intact. No gross neurologic deficits noted.  LABORATORY DATA: Lab Results  Component Value Date   WBC 16.8* 06/30/2011   HGB 10.2* 06/30/2011   HCT 31.7* 06/30/2011   PLT 242.0 06/30/2011   GLUCOSE 217* 06/30/2011   CHOL 141 04/18/2011   TRIG 203* 04/18/2011   HDL 23* 07/23/2009   LDLDIRECT 71.4 01/04/2011   LDLCALC See Comment mg/dL 1/61/0960   ALT 10 07/14/4096   AST 8 05/20/2011   NA 139 06/30/2011   K 4.7 06/30/2011   CL 101 06/30/2011   CREATININE 1.9* 06/30/2011   BUN 79* 06/30/2011   CO2 27 06/30/2011   TSH 7.011* 04/20/2011   INR 1.4 07/15/2011   HGBA1C 6.3* 05/18/2011     Assessment / Plan: 1. Acute on chronic diastolic heart failure - her weight is up per their report. She has swelling. Taking a total of 120 mg of Lasix a day. Will give her 3 days of Zaroxolyn. Check labs today and update her echo.   2. Atrial fib - no longer on amiodarone which I suspect is due to her lungs. Rate is controlled. She is on coumadin. No recent falls reported but has a history of falls.   3. Severe COPD - on oxygen. Planning on reestablishing with [name]  4. CKD - probably getting worse. She is not interested in going back to nephrology and not interested in dialysis.  5. HTN - blood pressure is ok.   6. Anemia - chronic - will recheck today as well.   7.  Chronic anticoagulation - reestablish here since she is back in [place].  We will check labs today. Update her echo. 3 days of Zaroxolyn. See [name] in 2 weeks for recheck.   Patient is agreeable to this plan and will call if any problems develop in the interim.

## 2012-02-28 NOTE — Patient Instructions (Addendum)
We need to check some labs today  I am going to have you stay on all your medicines  I am adding Zaroxolyn 5 mg daily for the next 3 days  See Dr. Shirlee Latch in 2 weeks  We will get an ultrasound of your heart  Call the [facility] office at [phone] if you have any questions, problems or concerns.

## 2012-02-29 LAB — CBC WITH DIFFERENTIAL/PLATELET
Basophils Absolute: 0.1 10*3/uL (ref 0.0–0.1)
Basophils Relative: 1.2 % (ref 0.0–3.0)
Eosinophils Absolute: 0.2 10*3/uL (ref 0.0–0.7)
Eosinophils Relative: 1.8 % (ref 0.0–5.0)
HCT: 30.2 % — ABNORMAL LOW (ref 36.0–46.0)
Hemoglobin: 9.5 g/dL — ABNORMAL LOW (ref 12.0–15.0)
Lymphocytes Relative: 11.3 % — ABNORMAL LOW (ref 12.0–46.0)
Lymphs Abs: 1.2 10*3/uL (ref 0.7–4.0)
MCHC: 31.5 g/dL (ref 30.0–36.0)
MCV: 90.1 fl (ref 78.0–100.0)
Monocytes Absolute: 0.2 10*3/uL (ref 0.1–1.0)
Monocytes Relative: 1.5 % — ABNORMAL LOW (ref 3.0–12.0)
Neutro Abs: 8.9 10*3/uL — ABNORMAL HIGH (ref 1.4–7.7)
Neutrophils Relative %: 84.2 % — ABNORMAL HIGH (ref 43.0–77.0)
Platelets: 195 10*3/uL (ref 150.0–400.0)
RBC: 3.36 Mil/uL — ABNORMAL LOW (ref 3.87–5.11)
RDW: 17.2 % — ABNORMAL HIGH (ref 11.5–14.6)
WBC: 10.6 10*3/uL — ABNORMAL HIGH (ref 4.5–10.5)

## 2012-02-29 LAB — BASIC METABOLIC PANEL
BUN: 79 mg/dL — ABNORMAL HIGH (ref 6–23)
CO2: 39 mEq/L — ABNORMAL HIGH (ref 19–32)
Calcium: 9.7 mg/dL (ref 8.4–10.5)
Chloride: 93 mEq/L — ABNORMAL LOW (ref 96–112)
Creatinine, Ser: 1.8 mg/dL — ABNORMAL HIGH (ref 0.4–1.2)
GFR: 28.25 mL/min — ABNORMAL LOW (ref >60.00)
Glucose, Bld: 187 mg/dL — ABNORMAL HIGH (ref 70–99)
Potassium: 4.1 mEq/L (ref 3.5–5.1)
Sodium: 139 mEq/L (ref 135–145)

## 2012-02-29 LAB — BRAIN NATRIURETIC PEPTIDE: Pro B Natriuretic peptide (BNP): 603 pg/mL — ABNORMAL HIGH (ref 0.0–100.0)

## 2012-03-02 ENCOUNTER — Other Ambulatory Visit: Payer: Self-pay

## 2012-03-02 ENCOUNTER — Ambulatory Visit (HOSPITAL_COMMUNITY): Payer: Medicare Other | Attending: Family Medicine | Admitting: Radiology

## 2012-03-02 DIAGNOSIS — I5020 Unspecified systolic (congestive) heart failure: Secondary | ICD-10-CM | POA: Insufficient documentation

## 2012-03-02 DIAGNOSIS — R0600 Dyspnea, unspecified: Secondary | ICD-10-CM

## 2012-03-02 DIAGNOSIS — G4733 Obstructive sleep apnea (adult) (pediatric): Secondary | ICD-10-CM | POA: Insufficient documentation

## 2012-03-02 DIAGNOSIS — E119 Type 2 diabetes mellitus without complications: Secondary | ICD-10-CM | POA: Insufficient documentation

## 2012-03-02 DIAGNOSIS — I4891 Unspecified atrial fibrillation: Secondary | ICD-10-CM | POA: Insufficient documentation

## 2012-03-02 DIAGNOSIS — I509 Heart failure, unspecified: Secondary | ICD-10-CM | POA: Insufficient documentation

## 2012-03-02 DIAGNOSIS — I10 Essential (primary) hypertension: Secondary | ICD-10-CM | POA: Insufficient documentation

## 2012-03-02 DIAGNOSIS — R0609 Other forms of dyspnea: Secondary | ICD-10-CM | POA: Insufficient documentation

## 2012-03-02 DIAGNOSIS — I739 Peripheral vascular disease, unspecified: Secondary | ICD-10-CM | POA: Insufficient documentation

## 2012-03-02 DIAGNOSIS — R0989 Other specified symptoms and signs involving the circulatory and respiratory systems: Secondary | ICD-10-CM | POA: Insufficient documentation

## 2012-03-02 DIAGNOSIS — Z87891 Personal history of nicotine dependence: Secondary | ICD-10-CM | POA: Insufficient documentation

## 2012-03-02 DIAGNOSIS — R609 Edema, unspecified: Secondary | ICD-10-CM | POA: Insufficient documentation

## 2012-03-02 NOTE — Progress Notes (Signed)
Echocardiogram performed.  

## 2012-03-14 ENCOUNTER — Other Ambulatory Visit: Payer: Self-pay | Admitting: Family Medicine

## 2012-03-15 ENCOUNTER — Ambulatory Visit (INDEPENDENT_AMBULATORY_CARE_PROVIDER_SITE_OTHER): Payer: Medicare Other | Admitting: *Deleted

## 2012-03-15 ENCOUNTER — Ambulatory Visit (INDEPENDENT_AMBULATORY_CARE_PROVIDER_SITE_OTHER): Payer: Medicare Other | Admitting: Cardiology

## 2012-03-15 ENCOUNTER — Encounter: Payer: Self-pay | Admitting: Cardiology

## 2012-03-15 VITALS — BP 122/60 | HR 81 | Ht 63.0 in | Wt 213.0 lb

## 2012-03-15 DIAGNOSIS — I4891 Unspecified atrial fibrillation: Secondary | ICD-10-CM

## 2012-03-15 DIAGNOSIS — J438 Other emphysema: Secondary | ICD-10-CM

## 2012-03-15 DIAGNOSIS — N189 Chronic kidney disease, unspecified: Secondary | ICD-10-CM

## 2012-03-15 DIAGNOSIS — I5032 Chronic diastolic (congestive) heart failure: Secondary | ICD-10-CM

## 2012-03-15 DIAGNOSIS — R0602 Shortness of breath: Secondary | ICD-10-CM

## 2012-03-15 DIAGNOSIS — J439 Emphysema, unspecified: Secondary | ICD-10-CM

## 2012-03-15 MED ORDER — TORSEMIDE 20 MG PO TABS: ORAL_TABLET | ORAL | Status: DC

## 2012-03-15 MED ORDER — POTASSIUM CHLORIDE CRYS ER 20 MEQ PO TBCR: 20.0000 meq | EXTENDED_RELEASE_TABLET | Freq: Every day | ORAL | Status: DC

## 2012-03-15 NOTE — Telephone Encounter (Signed)
No, she is seeing another doctor now.

## 2012-03-15 NOTE — Patient Instructions (Addendum)
Stop aspirin.  Stop lasix(furosemide),  Start torsemide 80mg  daily. This will be (4)  20mg  tablets daily at the same time in the morning.   Start KCl 20 meq daily.  Your physician recommends that you return for lab work in: 1 week--BMET/BNP. This is scheduled for Friday December 13,2013. The lab opens at 7:30am.   Your physician recommends that you schedule a follow-up appointment in: 2 weeks with Dr Shirlee Latch. This is scheduled for Wednesday December 18,2013 at 11:!5am.

## 2012-03-15 NOTE — Telephone Encounter (Signed)
Is she still our patient?

## 2012-03-15 NOTE — Progress Notes (Signed)
Patient ID: [name], female   DOB: Jul 25, 1930, 78 y.o.   MRN: [id number] PCP: Esau Grew in [place].  78 yo with history of atrial fibrillation, COPD, CKD and chronic diastolic CHF presents for cardiology followup.  I have not seen her for over a year now.  She was admitted 12/11 to 2/13 at [facility] for diverticulitis and had a prolonged hospitalization involving intubation, etc.  Since then, she has been splitting time between her daughter's house in [place] and her son in [place].  She now appears to be in permanent atrial fibrillation.  She is on home oxygen for her COPD.  Creatinine appears to have remained relatively stable.  She is significantly debilitated and has had trouble with dyspnea and fluid retention.  Most recently, she saw Norma Fredrickson in our office who gave her 3 days of metolazone.  She thinks this helped some, and weight is down 4 lbs, but she is still short of breath.  She is able to walk around the house with a walker but is out of breath doing this.  She is not short of breath at rest.  She sleeps in a recliner.  No chest pain.  No lightheadedness or syncope.  She is taking Lasix 80 mg qam, 40 mg qpm.   Labs (8/10): K 4.2, creatinine 2.8  Labs (11/10): creatinine 2.39, TGs 558, HDL 23  Labs (4/11): creatinine 2.05, K 4.4, LFTs normal, TGs 406, HDL 23  Labs (12/11): creatinine 2.5, K 3.9, LFTs normal, HCT 31.3  Labs (11/13): K 4.1, creatinine 1.8, BNP 603  ECG: Atrial fibrillation at 81 with right axis deviation  Allergies (verified):  1) ! Sulfa   Family History:  No FH premature CAD  emphysema: mother  asthma: mother   Social History:  2 children, now lives with son in [place]. Daughter, NP, is involved  Tobacco Use - Former. quit 1970, started at age 34. 1 ppd  pt is retired. previously worked as a Production designer, theatre/television/film for [employer].   Past Medical History:  1. Type II diabetes  2. Obesity.  3. COPD. PFTs (12/10): FVC 67%, FEV1 58%, ratio 60%,  TLC 95%, RV 138%, DLCO 43% (moderate obstructive defect). Now on home oxygen.  4. Diastolic congestive heart failure. Echo (11/13) with EF 60-65%, mild LVH, moderate biatrial enlargement.  5. Atrial fibrillation. 07/2008, failed cardioversion with TEE guidance in May 2010. Patient went back into NSR by 8/10 with amiodarone.  She was in chronic atrial fibrillation at the time of office visit in 12/13 and is off amiodarone.  6. Chronic kidney disease: ANCA+, pauci-immune glomerulonephritis.  She does not want HD or nephrology followup.  7. History of pneumonia with ARDS in 2005, MRSA PNA with prolonged hospitalization in [facility] in 11/11.  8. Jejunal diverticulum, s/p perf 07/2008  9. Possible obstructive sleep apnea. When the patient was sedated for TEE in May 2010, she did develop some upper airway obstruction. Apparently, however, she has had a negative sleep study.  10. Adenosine myoview (5/10): Normal LV EF and no scar or ischemia (normal stress).  11. Anemia of chronic disease  12. Hypothyroidism  13. Gout  14. Prolonged hospitalization at [facility] 12/12 - 2/13 with diverticulitis.   ROS: All systems reviewed and negative except as per HPI.   Current Outpatient Prescriptions  Medication Sig Dispense Refill  . albuterol (PROVENTIL) (2.5 MG/3ML) 0.083% nebulizer solution Take 2.5 mg by nebulization every 6 (six) hours as needed.      Marland Kitchen  beclomethasone (QVAR) 80 MCG/ACT inhaler Inhale 2 puffs into the lungs 2 (two) times daily.  1 Inhaler  12  . guaifenesin (HUMIBID E) 400 MG TABS Take 400 mg by mouth 3 (three) times daily.      . hydrALAZINE (APRESOLINE) 50 MG tablet Take 1 tablet (50 mg total) by mouth every 8 (eight) hours.  90 tablet  1  . ipratropium (ATROVENT) 0.02 % nebulizer solution Take 2.5 mLs (500 mcg total) by nebulization 4 (four) times daily.  75 mL  12  . levothyroxine (SYNTHROID, LEVOTHROID) 50 MCG tablet Take 50 mcg by mouth daily.      . metolazone (ZAROXOLYN) 5 MG tablet  Take one a day for 3 days only.  15 tablet  3  . metoprolol tartrate (LOPRESSOR) 25 MG tablet Take 0.5 tablets (12.5 mg total) by mouth 2 (two) times daily.  60 tablet  1  . Multiple Vitamin (MULITIVITAMIN WITH MINERALS) TABS Take 1 tablet by mouth daily.      . predniSONE (DELTASONE) 10 MG tablet Take 10 mg by mouth daily.      . ranitidine (ZANTAC) 150 MG capsule Take 150 mg by mouth every evening.        . simvastatin (ZOCOR) 20 MG tablet Take 40 mg by mouth daily.      Marland Kitchen tiotropium (SPIRIVA) 18 MCG inhalation capsule Place 18 mcg into inhaler and inhale daily.      . traMADol (ULTRAM) 50 MG tablet Take 1 tablet (50 mg total) by mouth every 6 (six) hours as needed.  50 tablet  5  . warfarin (COUMADIN) 1 MG tablet Take 1 mg by mouth every morning. As directed      . potassium chloride SA (K-DUR,KLOR-CON) 20 MEQ tablet Take 1 tablet (20 mEq total) by mouth daily.  30 tablet  6  . torsemide (DEMADEX) 20 MG tablet 4 tablets(total 80mg ) daily at the same time in the morning  120 tablet  6  . [DISCONTINUED] albuterol-ipratropium (COMBIVENT) 18-103 MCG/ACT inhaler Inhale 2 puffs into the lungs 2 (two) times daily.      . [DISCONTINUED] colchicine 0.6 MG tablet Take 0.6 mg by mouth daily.      . [DISCONTINUED] famotidine (PEPCID) 20 MG tablet Take 1 tablet (20 mg total) by mouth at bedtime.  30 tablet  1  . [DISCONTINUED] fluticasone (FLOVENT HFA) 110 MCG/ACT inhaler Inhale 2 puffs into the lungs 2 (two) times daily.        BP 122/60  Pulse 81  Ht 5\' 3"  (1.6 m)  Wt 213 lb (96.616 kg)  BMI 37.73 kg/m2 General: NAD, obese.  Neck: Thick, JVP difficult but appears elevated, no thyromegaly or thyroid nodule.  Lungs: Crackles at bases bilaterally.  CV: Nondisplaced PMI.  Heart irregular S1/S2, no S3/S4, no murmur.  2+ edema to knees bilaterally.  No carotid bruit.  Normal pedal pulses.  Abdomen: Soft, nontender, no hepatosplenomegaly, no distention.   Neurologic: Alert and oriented x 3.  Psych:  Normal affect. Extremities: No clubbing or cyanosis.   Assessment/Plan: 1. Diastolic CHF: Patient is volume overloaded on exam with NYHA class III-IV symptoms.  COPD contributes to the dyspnea.  CKD will make volume control tricky.   - Stop Lasix and start torsemide 80 mg daily with KCl 20 daily.   - BMET/BNP in 1 wk - Followup with me in the office in 2 wks.  2. Atrial fibrillation: Now chronic, and she is off amiodarone.  Rate is controlled on a  low dose of metoprolol.  She is on warfarin.  She is not a NOAC candidate with significant CKD. She can stop ASA as she is on warfarin.  3. CKD: Pauci-immune glomerulonephritis.  She does not want to see a nephrologist again and has made it clear that she would not want HD if renal disease were to progress.  4. COPD: On home oxygen.  She is on Spiriva.  Needs to followup with Dr. Delford Field.   Marca Ancona 03/16/2012

## 2012-03-23 ENCOUNTER — Ambulatory Visit (INDEPENDENT_AMBULATORY_CARE_PROVIDER_SITE_OTHER): Payer: Medicare Other | Admitting: *Deleted

## 2012-03-23 DIAGNOSIS — I5032 Chronic diastolic (congestive) heart failure: Secondary | ICD-10-CM

## 2012-03-23 DIAGNOSIS — I4891 Unspecified atrial fibrillation: Secondary | ICD-10-CM

## 2012-03-23 DIAGNOSIS — R0602 Shortness of breath: Secondary | ICD-10-CM

## 2012-03-23 LAB — BASIC METABOLIC PANEL
BUN: 69 mg/dL — ABNORMAL HIGH (ref 6–23)
Chloride: 96 mEq/L (ref 96–112)
Glucose, Bld: 148 mg/dL — ABNORMAL HIGH (ref 70–99)
Potassium: 4.6 mEq/L (ref 3.5–5.3)

## 2012-03-24 LAB — BRAIN NATRIURETIC PEPTIDE: Brain Natriuretic Peptide: 304.5 pg/mL — ABNORMAL HIGH (ref 0.0–100.0)

## 2012-03-28 ENCOUNTER — Ambulatory Visit (INDEPENDENT_AMBULATORY_CARE_PROVIDER_SITE_OTHER): Payer: Medicare Other | Admitting: Cardiology

## 2012-03-28 ENCOUNTER — Encounter: Payer: Self-pay | Admitting: Cardiology

## 2012-03-28 VITALS — BP 124/58 | HR 86 | Ht 63.0 in | Wt 212.0 lb

## 2012-03-28 DIAGNOSIS — J439 Emphysema, unspecified: Secondary | ICD-10-CM

## 2012-03-28 DIAGNOSIS — R0602 Shortness of breath: Secondary | ICD-10-CM

## 2012-03-28 DIAGNOSIS — I4891 Unspecified atrial fibrillation: Secondary | ICD-10-CM

## 2012-03-28 DIAGNOSIS — J438 Other emphysema: Secondary | ICD-10-CM

## 2012-03-28 DIAGNOSIS — I5032 Chronic diastolic (congestive) heart failure: Secondary | ICD-10-CM

## 2012-03-28 NOTE — Progress Notes (Signed)
Patient ID: [name], female   DOB: May 24, 1930, 78 y.o.   MRN: [id number] PCP: Esau Grew in [place].  78 yo with history of atrial fibrillation, COPD, CKD and chronic diastolic CHF presents for cardiology followup. She was admitted 12/11 to 2/13 at [facility] for diverticulitis and had a prolonged hospitalization involving intubation, etc.  Since then, she has been splitting time between her daughter's house in [place] and her son in [place].  She now appears to be in permanent atrial fibrillation.  She is on home oxygen for her COPD.  Creatinine appears to have remained relatively stable.   At last appointment, I thought she was volume overloaded and stopped Lasix, putting her on torsemide 80 mg daily.  She symptomatically is doing better.  She walked into the office today rather than using a wheelchair.  No dyspnea walking in the house but she is short of breath with longer distances.  She sleeps in a recliner chronically.  No chest pain.  No lightheadedness or syncope.  Weight is down 1 lb since last appointment.   Labs (8/10): K 4.2, creatinine 2.8  Labs (11/10): creatinine 2.39, TGs 558, HDL 23  Labs (4/11): creatinine 2.05, K 4.4, LFTs normal, TGs 406, HDL 23  Labs (12/11): creatinine 2.5, K 3.9, LFTs normal, HCT 31.3  Labs (11/13): K 4.1, creatinine 1.8, BNP 603 Labs (12/13): K 4.6, creatinine 1.9, BNP 304  Allergies (verified):  1) ! Sulfa   Family History:  No FH premature CAD  emphysema: mother  asthma: mother   Social History:  2 children, now lives with son in [place]. Daughter, NP, is involved  Tobacco Use - Former. quit 1970, started at age 68. 1 ppd  pt is retired. previously worked as a Production designer, theatre/television/film for [employer].   Past Medical History:  1. Type II diabetes  2. Obesity.  3. COPD. PFTs (12/10): FVC 67%, FEV1 58%, ratio 60%, TLC 95%, RV 138%, DLCO 43% (moderate obstructive defect). Now on home oxygen.  4. Diastolic congestive heart failure. Echo  (11/13) with EF 60-65%, mild LVH, moderate biatrial enlargement.  5. Atrial fibrillation. 07/2008, failed cardioversion with TEE guidance in May 2010. Patient went back into NSR by 8/10 with amiodarone.  She was in chronic atrial fibrillation at the time of office visit in 12/13 and is off amiodarone.  6. Chronic kidney disease: ANCA+, pauci-immune glomerulonephritis.  She does not want HD or nephrology followup.  7. History of pneumonia with ARDS in 2005, MRSA PNA with prolonged hospitalization in [facility] in 11/11.  8. Jejunal diverticulum, s/p perf 07/2008  9. Possible obstructive sleep apnea. When the patient was sedated for TEE in May 2010, she did develop some upper airway obstruction. Apparently, however, she has had a negative sleep study.  10. Adenosine myoview (5/10): Normal LV EF and no scar or ischemia (normal stress).  11. Anemia of chronic disease  12. Hypothyroidism  13. Gout  14. Prolonged hospitalization at [facility] 12/12 - 2/13 with diverticulitis.   ROS: All systems reviewed and negative except as per HPI.   Current Outpatient Prescriptions  Medication Sig Dispense Refill  . albuterol (PROVENTIL) (2.5 MG/3ML) 0.083% nebulizer solution Take 2.5 mg by nebulization every 6 (six) hours as needed.      . beclomethasone (QVAR) 80 MCG/ACT inhaler Inhale 2 puffs into the lungs 2 (two) times daily.  1 Inhaler  12  . guaifenesin (HUMIBID E) 400 MG TABS Take 400 mg by mouth 3 (three) times daily.      Marland Kitchen  hydrALAZINE (APRESOLINE) 50 MG tablet Take 1 tablet (50 mg total) by mouth every 8 (eight) hours.  90 tablet  1  . ipratropium (ATROVENT) 0.02 % nebulizer solution Take 2.5 mLs (500 mcg total) by nebulization 4 (four) times daily.  75 mL  12  . levothyroxine (SYNTHROID, LEVOTHROID) 50 MCG tablet Take 50 mcg by mouth daily.      . metoprolol tartrate (LOPRESSOR) 25 MG tablet Take 0.5 tablets (12.5 mg total) by mouth 2 (two) times daily.  60 tablet  1  . Multiple Vitamin (MULITIVITAMIN  WITH MINERALS) TABS Take 1 tablet by mouth daily.      . potassium chloride SA (K-DUR,KLOR-CON) 20 MEQ tablet 1 daily except take 2 daily on Thursdays when you take metolazone      . predniSONE (DELTASONE) 10 MG tablet Take 10 mg by mouth daily.      . ranitidine (ZANTAC) 150 MG capsule Take 150 mg by mouth every evening.        . simvastatin (ZOCOR) 20 MG tablet Take 40 mg by mouth daily.      Marland Kitchen tiotropium (SPIRIVA) 18 MCG inhalation capsule Place 18 mcg into inhaler and inhale daily.      Marland Kitchen torsemide (DEMADEX) 20 MG tablet 4 tablets(total 80mg ) daily at the same time in the morning  120 tablet  6  . traMADol (ULTRAM) 50 MG tablet Take 1 tablet (50 mg total) by mouth every 6 (six) hours as needed.  50 tablet  5  . warfarin (COUMADIN) 1 MG tablet Take 1 mg by mouth every morning. As directed      . metolazone (ZAROXOLYN) 2.5 MG tablet Take on Thursdays 30 minutes before you take torsemide. 1/2 of a 5mg  tablet on Thursdays 30 minutes before torsemide      . [DISCONTINUED] albuterol-ipratropium (COMBIVENT) 18-103 MCG/ACT inhaler Inhale 2 puffs into the lungs 2 (two) times daily.      . [DISCONTINUED] colchicine 0.6 MG tablet Take 0.6 mg by mouth daily.      . [DISCONTINUED] famotidine (PEPCID) 20 MG tablet Take 1 tablet (20 mg total) by mouth at bedtime.  30 tablet  1  . [DISCONTINUED] fluticasone (FLOVENT HFA) 110 MCG/ACT inhaler Inhale 2 puffs into the lungs 2 (two) times daily.        BP 124/58  Pulse 86  Ht 5\' 3"  (1.6 m)  Wt 212 lb (96.163 kg)  BMI 37.55 kg/m2  SpO2 96% General: NAD, obese.  Neck: Thick, JVP difficult but appears elevated, no thyromegaly or thyroid nodule.  Lungs: Crackles at bases bilaterally.  CV: Nondisplaced PMI.  Heart irregular S1/S2, no S3/S4, no murmur.  2+ edema to knees bilaterally.  No carotid bruit.  Normal pedal pulses.  Abdomen: Soft, nontender, no hepatosplenomegaly, no distention.   Neurologic: Alert and oriented x 3.  Psych: Normal affect. Extremities:  No clubbing or cyanosis.   Assessment/Plan: 1. Diastolic CHF: Patient remains volume overloaded on exam with NYHA class III symptoms, somewhat better.  Her weight, however, is only down 1 lb despite transition to torsemide.  COPD contributes to the dyspnea.  CKD will make volume control tricky.   - Continue current torsemide. - Add metolazone 2.5 mg 30 minutes before torsemide once a week.    - BMET/BNP in 2 wks - Followup with me in the office in 3 wks.  2. Atrial fibrillation: Now chronic, and she is off amiodarone.  Rate is controlled on a low dose of metoprolol.  She is on  warfarin.  She is not a NOAC candidate with significant CKD.  3. CKD: Pauci-immune glomerulonephritis.  She does not want to see a nephrologist again and has made it clear that she would not want HD if renal disease were to progress.  4. COPD: On home oxygen.  She is on Spiriva.  Needs to followup with Dr. Delford Field.   Marca Ancona 03/28/2012

## 2012-03-28 NOTE — Patient Instructions (Addendum)
Take metolazone 2.5mg  on Thursdays 30 minutes before you take torsemide.   Take 2 KCL (potassium) on Thursdays when you take metolazone.   Your physician recommends that you return for lab work in: 2 weeks (January 3)after you have 2 doses of metolazone.  Your physician recommends that you schedule a follow-up appointment in: 3 weeks with Dr Shirlee Latch.

## 2012-04-02 ENCOUNTER — Ambulatory Visit (INDEPENDENT_AMBULATORY_CARE_PROVIDER_SITE_OTHER): Payer: Medicare Other | Admitting: *Deleted

## 2012-04-02 DIAGNOSIS — I4891 Unspecified atrial fibrillation: Secondary | ICD-10-CM

## 2012-04-13 ENCOUNTER — Other Ambulatory Visit: Payer: Medicare Other

## 2012-04-19 ENCOUNTER — Encounter: Payer: Self-pay | Admitting: Cardiology

## 2012-04-20 ENCOUNTER — Encounter: Payer: Self-pay | Admitting: Cardiology

## 2012-04-20 ENCOUNTER — Telehealth: Payer: Self-pay | Admitting: Critical Care Medicine

## 2012-04-20 ENCOUNTER — Ambulatory Visit (INDEPENDENT_AMBULATORY_CARE_PROVIDER_SITE_OTHER)
Admission: RE | Admit: 2012-04-20 | Discharge: 2012-04-20 | Disposition: A | Discharge status: Home / Self Care | Payer: Medicare Other | Source: Ambulatory Visit | Attending: Cardiology | Admitting: Cardiology

## 2012-04-20 ENCOUNTER — Ambulatory Visit (INDEPENDENT_AMBULATORY_CARE_PROVIDER_SITE_OTHER): Payer: Medicare Other | Admitting: Cardiology

## 2012-04-20 ENCOUNTER — Ambulatory Visit (INDEPENDENT_AMBULATORY_CARE_PROVIDER_SITE_OTHER): Payer: Medicare Other | Admitting: *Deleted

## 2012-04-20 VITALS — BP 124/72 | HR 83 | Ht 63.0 in | Wt 204.0 lb

## 2012-04-20 DIAGNOSIS — I4891 Unspecified atrial fibrillation: Secondary | ICD-10-CM

## 2012-04-20 DIAGNOSIS — J439 Emphysema, unspecified: Secondary | ICD-10-CM

## 2012-04-20 DIAGNOSIS — I5032 Chronic diastolic (congestive) heart failure: Secondary | ICD-10-CM

## 2012-04-20 DIAGNOSIS — N189 Chronic kidney disease, unspecified: Secondary | ICD-10-CM

## 2012-04-20 DIAGNOSIS — J438 Other emphysema: Secondary | ICD-10-CM

## 2012-04-20 LAB — POCT INR: INR: 1.9

## 2012-04-20 NOTE — Telephone Encounter (Signed)
I was able to schedule an appt w/ PW in [place] for pt for 05/07/12.  Jasmine December stated nothing further needed at this time.  Antionette Fairy

## 2012-04-20 NOTE — Telephone Encounter (Signed)
Will forward back to [place] to provide a number to call, thanks

## 2012-04-20 NOTE — Patient Instructions (Addendum)
Stop metolazone.   Hold torsemide for 2 days. Start back on your current dose of torsemide 80mg  daily at the same time after not taking it for 2 days.   Hold KCL for 2 days. Start back on KCL 20 mEq daily after not taking it for  KCL for 2 days.  A chest x-ray takes a picture of the organs and structures inside the chest, including the heart, lungs, and blood vessels. This test can show several things, including, whether the heart is enlarges; whether fluid is building up in the lungs; and whether pacemaker / defibrillator leads are still in place. You can have this done at the [facility] [address]--go to the basement.    You have been referred to Dr Delford Field at [facility] Pulmonary Department.   You have been referred to Dr Eliott Nine at [facility].  Your physician recommends that you return for lab work in: 2 weeks--BMET/BNP.You have the order. Please fax the results to Dr Shirlee Latch at [phone].  Your physician recommends that you schedule a follow-up appointment in: 1 month with Dr Shirlee Latch.

## 2012-04-22 NOTE — Progress Notes (Signed)
Patient ID: [name], female   DOB: 02-24-1931, 79 y.o.   MRN: [id number] PCP: Esau Grew in [place].  79 yo with history of atrial fibrillation, COPD, CKD and chronic diastolic CHF presents for cardiology followup. She was admitted 12/11 to 2/13 at [facility] for diverticulitis and had a prolonged hospitalization involving intubation, etc.  Since then, she has been splitting time between her daughter's house in [place] and her son in [place].  She now appears to be in permanent atrial fibrillation.  She is on home oxygen for her COPD.  Recently, she has been significantly volume overloaded and I have increased her diuretic regimen with torsemide and weekly metolazone.   Since I last saw her, weight is down 8 lbs.  Her breathing is better.  She is able to walk farther but still gets short of breath with longer distances.  She has had upper respiratory congestion for several days now and feels like she has a URI.  BMET was done recently, showing creatinine has increased to 2.3 but BUN is up to 101.    Labs (8/10): K 4.2, creatinine 2.8  Labs (11/10): creatinine 2.39, TGs 558, HDL 23  Labs (4/11): creatinine 2.05, K 4.4, LFTs normal, TGs 406, HDL 23  Labs (12/11): creatinine 2.5, K 3.9, LFTs normal, HCT 31.3  Labs (11/13): K 4.1, creatinine 1.8, BNP 603 Labs (12/13): K 4.6, creatinine 1.9, BNP 304 Labs (1/14): K 4.3, creatinine 2.3, BUN 101  Allergies (verified):  1) ! Sulfa   Family History:  No FH premature CAD  emphysema: mother  asthma: mother   Social History:  2 children, now lives with son in [place]. Daughter, NP, is involved  Tobacco Use - Former. quit 1970, started at age 37. 1 ppd  pt is retired. previously worked as a Production designer, theatre/television/film for [employer].   Past Medical History:  1. Type II diabetes  2. Obesity.  3. COPD. PFTs (12/10): FVC 67%, FEV1 58%, ratio 60%, TLC 95%, RV 138%, DLCO 43% (moderate obstructive defect). Now on home oxygen.  4. Diastolic  congestive heart failure. Echo (11/13) with EF 60-65%, mild LVH, moderate biatrial enlargement.  5. Atrial fibrillation. 07/2008, failed cardioversion with TEE guidance in May 2010. Patient went back into NSR by 8/10 with amiodarone.  She was in chronic atrial fibrillation at the time of office visit in 12/13 and is off amiodarone.  6. Chronic kidney disease: ANCA+, pauci-immune glomerulonephritis.  She does not want HD or nephrology followup.  7. History of pneumonia with ARDS in 2005, MRSA PNA with prolonged hospitalization in [facility] in 11/11.  8. Jejunal diverticulum, s/p perf 07/2008  9. Possible obstructive sleep apnea. When the patient was sedated for TEE in May 2010, she did develop some upper airway obstruction. Apparently, however, she has had a negative sleep study.  10. Adenosine myoview (5/10): Normal LV EF and no scar or ischemia (normal stress).  11. Anemia of chronic disease  12. Hypothyroidism  13. Gout  14. Prolonged hospitalization at [facility] 12/12 - 2/13 with diverticulitis.   ROS: All systems reviewed and negative except as per HPI.   Current Outpatient Prescriptions  Medication Sig Dispense Refill  . albuterol (PROVENTIL) (2.5 MG/3ML) 0.083% nebulizer solution Take 2.5 mg by nebulization every 6 (six) hours as needed.      . beclomethasone (QVAR) 80 MCG/ACT inhaler Inhale 2 puffs into the lungs 2 (two) times daily.  1 Inhaler  12  . guaifenesin (HUMIBID E) 400 MG TABS  Take 400 mg by mouth 3 (three) times daily.      . hydrALAZINE (APRESOLINE) 50 MG tablet Take 1 tablet (50 mg total) by mouth every 8 (eight) hours.  90 tablet  1  . ipratropium (ATROVENT) 0.02 % nebulizer solution Take 2.5 mLs (500 mcg total) by nebulization 4 (four) times daily.  75 mL  12  . levothyroxine (SYNTHROID, LEVOTHROID) 50 MCG tablet Take 50 mcg by mouth daily.      . metoprolol tartrate (LOPRESSOR) 25 MG tablet Take 0.5 tablets (12.5 mg total) by mouth 2 (two) times daily.  60 tablet  1  .  Multiple Vitamin (MULITIVITAMIN WITH MINERALS) TABS Take 1 tablet by mouth daily.      . potassium chloride SA (K-DUR,KLOR-CON) 20 MEQ tablet 1 daily      . predniSONE (DELTASONE) 10 MG tablet Take 10 mg by mouth daily.      . ranitidine (ZANTAC) 150 MG capsule Take 150 mg by mouth every evening.        . simvastatin (ZOCOR) 20 MG tablet Take 40 mg by mouth daily.      Marland Kitchen tiotropium (SPIRIVA) 18 MCG inhalation capsule Place 18 mcg into inhaler and inhale daily.      Marland Kitchen torsemide (DEMADEX) 20 MG tablet 4 tablets(total 80mg ) daily at the same time in the morning  120 tablet  6  . traMADol (ULTRAM) 50 MG tablet Take 1 tablet (50 mg total) by mouth every 6 (six) hours as needed.  50 tablet  5  . warfarin (COUMADIN) 1 MG tablet Take 1 mg by mouth every morning. As directed      . [DISCONTINUED] albuterol-ipratropium (COMBIVENT) 18-103 MCG/ACT inhaler Inhale 2 puffs into the lungs 2 (two) times daily.      . [DISCONTINUED] colchicine 0.6 MG tablet Take 0.6 mg by mouth daily.      . [DISCONTINUED] famotidine (PEPCID) 20 MG tablet Take 1 tablet (20 mg total) by mouth at bedtime.  30 tablet  1  . [DISCONTINUED] fluticasone (FLOVENT HFA) 110 MCG/ACT inhaler Inhale 2 puffs into the lungs 2 (two) times daily.        BP 124/72  Pulse 83  Ht 5\' 3"  (1.6 m)  Wt 204 lb (92.534 kg)  BMI 36.14 kg/m2  SpO2 90% General: NAD, obese.  Neck: Thick, No JVD, no thyromegaly or thyroid nodule.  Lungs: Rhonchi noted CV: Nondisplaced PMI.  Heart irregular S1/S2, no S3/S4, no murmur.  1+ edema to knees bilaterally (improved).  No carotid bruit.   Abdomen: Soft, nontender, no hepatosplenomegaly, no distention.   Neurologic: Alert and oriented x 3.  Psych: Normal affect. Extremities: No clubbing or cyanosis.   Assessment/Plan: 1. Diastolic CHF:  Volume looks better now and she has lost 8 lbs.  However, BUN and creatinine are significantly elevated now. - Stop torsemide for 2 days then resume at current dose.  - Stop  metolazone.  - BMET/BNP in 1 week - Followup in the office in 1 month.  2. Atrial fibrillation: Now chronic, and she is off amiodarone.  Rate is controlled on a low dose of metoprolol.  She is on warfarin.  She is not a NOAC candidate with significant CKD.  3. CKD: Pauci-immune glomerulonephritis.  I am going to arrange for her to go back to see a nephrologist again.  4. COPD: On home oxygen.  She is on Spiriva.  Needs to followup with Dr. Delford Field.  5. URI: CXR to make sure she does not  have PNA.   Marca Ancona 04/22/2012

## 2012-04-26 ENCOUNTER — Other Ambulatory Visit: Payer: Self-pay | Admitting: Cardiology

## 2012-04-30 ENCOUNTER — Encounter: Payer: Self-pay | Admitting: Family Medicine

## 2012-04-30 NOTE — Telephone Encounter (Signed)
This was sent to me in error. Ms. Leder fired me as MD some time ago - I believe this should go to the [facility] Coumadin clinic.  I do not know how to make that happen, so I will send this message to Dr. Shirlee Latch, the patient's cardiologist.  Thanks for assistance in this matter.

## 2012-05-04 ENCOUNTER — Other Ambulatory Visit: Payer: Medicare Other

## 2012-05-07 ENCOUNTER — Ambulatory Visit (INDEPENDENT_AMBULATORY_CARE_PROVIDER_SITE_OTHER): Payer: Medicare Other | Admitting: Critical Care Medicine

## 2012-05-07 ENCOUNTER — Ambulatory Visit (INDEPENDENT_AMBULATORY_CARE_PROVIDER_SITE_OTHER): Payer: Medicare Other | Admitting: *Deleted

## 2012-05-07 ENCOUNTER — Other Ambulatory Visit (INDEPENDENT_AMBULATORY_CARE_PROVIDER_SITE_OTHER): Payer: Medicare Other

## 2012-05-07 ENCOUNTER — Encounter: Payer: Self-pay | Admitting: Critical Care Medicine

## 2012-05-07 VITALS — BP 114/76 | HR 80 | Temp 97.7°F | Ht 63.0 in | Wt 201.5 lb

## 2012-05-07 DIAGNOSIS — R0602 Shortness of breath: Secondary | ICD-10-CM

## 2012-05-07 DIAGNOSIS — J438 Other emphysema: Secondary | ICD-10-CM

## 2012-05-07 DIAGNOSIS — J439 Emphysema, unspecified: Secondary | ICD-10-CM

## 2012-05-07 DIAGNOSIS — I5032 Chronic diastolic (congestive) heart failure: Secondary | ICD-10-CM

## 2012-05-07 DIAGNOSIS — J9610 Chronic respiratory failure, unspecified whether with hypoxia or hypercapnia: Secondary | ICD-10-CM

## 2012-05-07 DIAGNOSIS — I4891 Unspecified atrial fibrillation: Secondary | ICD-10-CM

## 2012-05-07 LAB — BASIC METABOLIC PANEL
GFR: 23.57 mL/min — ABNORMAL LOW (ref >60.00)
Glucose, Bld: 156 mg/dL — ABNORMAL HIGH (ref 70–99)
Potassium: 3.7 mEq/L (ref 3.5–5.1)
Sodium: 142 mEq/L (ref 135–145)

## 2012-05-07 MED ORDER — TIOTROPIUM BROMIDE MONOHYDRATE 18 MCG IN CAPS: 18.0000 ug | ORAL_CAPSULE | Freq: Every day | RESPIRATORY_TRACT | Status: DC

## 2012-05-07 MED ORDER — LEVOFLOXACIN 500 MG PO TABS: 500.0000 mg | ORAL_TABLET | Freq: Every day | ORAL | Status: DC

## 2012-05-07 NOTE — Assessment & Plan Note (Signed)
Chronic hypoxemic respiratory failure on the basis of severe COPD

## 2012-05-07 NOTE — Patient Instructions (Addendum)
Start levaquin one daily for 5days.   Get protime checked early as levaquin will make your blood thin Stop Qvar Start Spiriva daily Stay on Albuterol in nebulizer 2-3 times daily Stay on oxygen Stay on prednisone daily spiriva refill and levaquin sent to [place] [facility] Return 3 months [place] office

## 2012-05-07 NOTE — Assessment & Plan Note (Signed)
Chronic obstructive lung disease with primary emphysematous component and chronic hypoxemia with chronic respiratory failure Gold stage C. COPD, recurrent exacerbations Associated diastolic heart failure with volume excess responding to diuresis per cardiology Plan Start levaquin one daily for 5days.   Monitor pro time more frequently while on Levaquin due to drug interaction with Coumadin Stop Qvar Start Spiriva daily Stay on Albuterol in nebulizer 2-3 times daily Stay on oxygen Stay on prednisone daily spiriva refill and levaquin sent to [facility] Return 3 months [place] office

## 2012-05-07 NOTE — Progress Notes (Signed)
Subjective:    Patient ID: [name], female    DOB: 12/05/1930, 79 y.o.   MRN: [id number]  HPI  05/07/2012 Hx severe Copd ,CKD, Diastolic CHF. Not seen since 2011. Referred back by Cardiology. Hosp in 03/2011 on vent. Managed since last pulm eval 2012-2014 by PCP [place]. Pt notes was "ok" as long as stay on oxygen.  Not very active.  Lives with son and daughter. Using oxygen 2Liter continuous.  Notes some mucus and is green in color.    Past Medical History  Diagnosis Date  . Diabetes mellitus   . Obesity   . COPD (chronic obstructive pulmonary disease)     Moderate. PFTs (12/10): FVC 67%, FEV1 58%, ratio 60%, TLC 95%, RV 138%, DLCO 43% (moderate obstructive defect). She is on home oxygen that should be worn at all times.  . Diastolic CHF, chronic     Echo (3/12): EF 55-60%, mild LV hypertrophy, grade I diastolic dysfunction, mild left atrial enlargement, normal pulmonary artery pressure  . CKD (chronic kidney disease)     Cr 2.4 when last checked. 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 PNA (pneumonia)     h/o with ARDS in 2005; MRSA PNA with prolonged hospitalization in [facility] regional 11/11  . Diverticulum     Jejunal  . OSA (obstructive sleep apnea)     possible. When pt was sedated for TEE in May 2010, she did develop some upper airway obstruction  . Anemia     of chronic disease  . Hypothyroidism   . Gout   . Atrial fibrillation 07/2008    Failed cardioversion with TEE guidance in May 2010. Pt went back to NSR by 11/2008 with amiodarne  . Hypertension   . GERD (gastroesophageal reflux disease)   . Peripheral vascular disease   . Pneumonia   . Chronic kidney disease   . Respiratory failure, chronic 07/01/2011  . Chronic gout due to renal impairment 07/01/2011     Family History  Problem Relation Age of Onset  . Emphysema Mother     and asthma  . Asthma Mother   . Heart disease Neg Hx     premature     History   Social  History  . Marital Status: Married    Spouse Name: N/A    Number of Children: N/A  . Years of Education: N/A   Occupational History  . Retired     Previously worked as Production designer, theatre/television/film for [employer]   Social History Main Topics  . Smoking status: Former Smoker -- 1.0 packs/day for 23 years    Types: Cigarettes    Quit date: 04/11/1976  . Smokeless tobacco: Never Used     Comment: Started at age 53 (quit at age 79)  . Alcohol Use: No  . Drug Use: No  . Sexually Active: Not Currently   Other Topics Concern  . Not on file   Social History Narrative   Still living at home, married and has at least a daughter [name] [phone] and a son. Daughter is an NP. Pt emergency contact per [name] is Darleene Cumpian (sister) [phone]     Allergies  Allergen Reactions  . Sulfonamide Derivatives     Childhood reaction     Outpatient Prescriptions Prior to Visit  Medication Sig Dispense Refill  . albuterol (PROVENTIL) (2.5 MG/3ML) 0.083% nebulizer solution Take 2.5 mg by nebulization 2 (two) times daily.       Marland Kitchen guaifenesin (  HUMIBID E) 400 MG TABS Take 400 mg by mouth 3 (three) times daily.      . hydrALAZINE (APRESOLINE) 50 MG tablet Take 1 tablet (50 mg total) by mouth every 8 (eight) hours.  90 tablet  1  . levothyroxine (SYNTHROID, LEVOTHROID) 50 MCG tablet Take 50 mcg by mouth daily.      . metoprolol tartrate (LOPRESSOR) 25 MG tablet Take 0.5 tablets (12.5 mg total) by mouth 2 (two) times daily.  60 tablet  1  . Multiple Vitamin (MULITIVITAMIN WITH MINERALS) TABS Take 1 tablet by mouth daily.      . potassium chloride SA (K-DUR,KLOR-CON) 20 MEQ tablet 1 daily      . predniSONE (DELTASONE) 10 MG tablet Take 10 mg by mouth daily.      . ranitidine (ZANTAC) 150 MG capsule Take 150 mg by mouth every evening.        . simvastatin (ZOCOR) 20 MG tablet Take 40 mg by mouth daily.      Marland Kitchen torsemide (DEMADEX) 20 MG tablet 4 tablets(total 80mg ) daily at the same time in the morning  120 tablet   6  . traMADol (ULTRAM) 50 MG tablet Take 1 tablet (50 mg total) by mouth every 6 (six) hours as needed.  50 tablet  5  . warfarin (COUMADIN) 1 MG tablet Take 1 mg by mouth every morning. As directed      . [DISCONTINUED] beclomethasone (QVAR) 80 MCG/ACT inhaler Inhale 2 puffs into the lungs 2 (two) times daily.  1 Inhaler  12  . [DISCONTINUED] ipratropium (ATROVENT) 0.02 % nebulizer solution Take 2.5 mLs (500 mcg total) by nebulization 4 (four) times daily.  75 mL  12  . [DISCONTINUED] metoprolol tartrate (LOPRESSOR) 25 MG tablet TAKE ONE TABLET BY MOUTH TWICE DAILY - MUST MAKE APPOINTMENT FOR FURTHER REFILLS  60 tablet  0  . [DISCONTINUED] tiotropium (SPIRIVA) 18 MCG inhalation capsule Place 18 mcg into inhaler and inhale daily.       Last reviewed on 05/07/2012 11:32 AM by Storm Frisk, MD     Review of Systems Constitutional:   No  weight loss, night sweats,  Fevers, chills, fatigue, lassitude. HEENT:   No headaches,  Difficulty swallowing,  Tooth/dental problems,  Sore throat,                No sneezing, itching, ear ache, nasal congestion, post nasal drip,   CV:  No chest pain,  Orthopnea, PND, ++++swelling in lower extremities,  No anasarca, dizziness, palpitations  GI  No heartburn, indigestion, abdominal pain, nausea, vomiting, diarrhea, change in bowel habits, loss of appetite  Resp: +++shortness of breath with exertion and  at rest.  Notes  excess mucus, notes  productive cough,  No non-productive cough,  No coughing up of blood.  Notes  change in color of mucus.  No wheezing.  No chest wall deformity  Skin: no rash or lesions.  GU: no dysuria, change in color of urine, no urgency or frequency.  No flank pain.  MS:  No joint pain or swelling.  No decreased range of motion.  No back pain.  Psych:  No change in mood or affect. No depression or anxiety.  No memory loss.     Objective:   Physical Exam Filed Vitals:   05/07/12 1120 05/07/12 1125  BP: 114/76   Pulse: 80     Temp: 97.7 F (36.5 C)   TempSrc: Oral   Height: 5\' 3"  (1.6 m)  Weight: 201 lb 8 oz (91.4 kg)   SpO2: 86% 92%    Gen: Pleasant, well-nourished, in no distress,  normal affect  ENT: No lesions,  mouth clear,  oropharynx clear, no postnasal drip  Neck: No JVD, no TMG, no carotid bruits  Lungs: No use of accessory muscles, no dullness to percussion, rhonchi right lower lobe, expired wheezes, poor airflow  Cardiovascular: RRR, heart sounds normal, no murmur or gallops, 3+  peripheral edema in LE  Abdomen: soft and NT, no HSM,  BS normal  Musculoskeletal: No deformities, no cyanosis or clubbing  Neuro: alert, non focal  Skin: Warm, no lesions or rashes          Assessment & Plan:   COPD with emphysema Chronic obstructive lung disease with primary emphysematous component and chronic hypoxemia with chronic respiratory failure Gold stage C. COPD, recurrent exacerbations Associated diastolic heart failure with volume excess responding to diuresis per cardiology Plan Start levaquin one daily for 5days.   Monitor pro time more frequently while on Levaquin due to drug interaction with Coumadin Stop Qvar Start Spiriva daily Stay on Albuterol in nebulizer 2-3 times daily Stay on oxygen Stay on prednisone daily spiriva refill and levaquin sent to [place] [facility] Return 3 months [place] office   Respiratory failure, chronic Chronic hypoxemic respiratory failure on the basis of severe COPD   Updated Medication List Outpatient Encounter Prescriptions as of 05/07/2012  Medication Sig Dispense Refill  . albuterol (PROVENTIL) (2.5 MG/3ML) 0.083% nebulizer solution Take 2.5 mg by nebulization 2 (two) times daily.       Marland Kitchen guaifenesin (HUMIBID E) 400 MG TABS Take 400 mg by mouth 3 (three) times daily.      . hydrALAZINE (APRESOLINE) 50 MG tablet Take 1 tablet (50 mg total) by mouth every 8 (eight) hours.  90 tablet  1  . levothyroxine (SYNTHROID, LEVOTHROID) 50 MCG tablet Take 50  mcg by mouth daily.      . metoprolol tartrate (LOPRESSOR) 25 MG tablet Take 0.5 tablets (12.5 mg total) by mouth 2 (two) times daily.  60 tablet  1  . Multiple Vitamin (MULITIVITAMIN WITH MINERALS) TABS Take 1 tablet by mouth daily.      . potassium chloride SA (K-DUR,KLOR-CON) 20 MEQ tablet 1 daily      . predniSONE (DELTASONE) 10 MG tablet Take 10 mg by mouth daily.      . ranitidine (ZANTAC) 150 MG capsule Take 150 mg by mouth every evening.        Marland Kitchen Respiratory Therapy Supplies (FLUTTER) DEVI by Does not apply route. Use as directed      . simvastatin (ZOCOR) 20 MG tablet Take 40 mg by mouth daily.      Marland Kitchen torsemide (DEMADEX) 20 MG tablet 4 tablets(total 80mg ) daily at the same time in the morning  120 tablet  6  . traMADol (ULTRAM) 50 MG tablet Take 1 tablet (50 mg total) by mouth every 6 (six) hours as needed.  50 tablet  5  . warfarin (COUMADIN) 1 MG tablet Take 1 mg by mouth every morning. As directed      . [DISCONTINUED] beclomethasone (QVAR) 80 MCG/ACT inhaler Inhale 2 puffs into the lungs 2 (two) times daily.  1 Inhaler  12  . levofloxacin (LEVAQUIN) 500 MG tablet Take 1 tablet (500 mg total) by mouth daily.  5 tablet  0  . tiotropium (SPIRIVA HANDIHALER) 18 MCG inhalation capsule Place 1 capsule (18 mcg total) into inhaler and inhale daily.  30 capsule  11  . tiotropium (SPIRIVA HANDIHALER) 18 MCG inhalation capsule Place 1 capsule (18 mcg total) into inhaler and inhale daily.  20 capsule  0  . [DISCONTINUED] ipratropium (ATROVENT) 0.02 % nebulizer solution Take 2.5 mLs (500 mcg total) by nebulization 4 (four) times daily.  75 mL  12  . [DISCONTINUED] metoprolol tartrate (LOPRESSOR) 25 MG tablet TAKE ONE TABLET BY MOUTH TWICE DAILY - MUST MAKE APPOINTMENT FOR FURTHER REFILLS  60 tablet  0  . [DISCONTINUED] tiotropium (SPIRIVA) 18 MCG inhalation capsule Place 18 mcg into inhaler and inhale daily.

## 2012-05-08 ENCOUNTER — Other Ambulatory Visit: Payer: Self-pay | Admitting: *Deleted

## 2012-05-08 DIAGNOSIS — I5032 Chronic diastolic (congestive) heart failure: Secondary | ICD-10-CM

## 2012-05-08 DIAGNOSIS — I4891 Unspecified atrial fibrillation: Secondary | ICD-10-CM

## 2012-05-21 ENCOUNTER — Ambulatory Visit (INDEPENDENT_AMBULATORY_CARE_PROVIDER_SITE_OTHER): Payer: Medicare Other | Admitting: Pharmacist

## 2012-05-21 ENCOUNTER — Other Ambulatory Visit (INDEPENDENT_AMBULATORY_CARE_PROVIDER_SITE_OTHER): Payer: Medicare Other

## 2012-05-21 DIAGNOSIS — I4891 Unspecified atrial fibrillation: Secondary | ICD-10-CM

## 2012-05-21 DIAGNOSIS — I5032 Chronic diastolic (congestive) heart failure: Secondary | ICD-10-CM

## 2012-05-21 LAB — BASIC METABOLIC PANEL
BUN: 73 mg/dL — ABNORMAL HIGH (ref 6–23)
Calcium: 9.4 mg/dL (ref 8.4–10.5)
Creatinine, Ser: 1.9 mg/dL — ABNORMAL HIGH (ref 0.4–1.2)
GFR: 27.2 mL/min — ABNORMAL LOW (ref >60.00)

## 2012-05-21 LAB — POCT INR: INR: 2.5

## 2012-05-21 MED ORDER — WARFARIN SODIUM 3 MG PO TABS: 3.0000 mg | ORAL_TABLET | ORAL | Status: DC

## 2012-05-29 ENCOUNTER — Ambulatory Visit: Payer: Medicare Other | Admitting: Cardiology

## 2012-06-05 ENCOUNTER — Ambulatory Visit: Payer: Medicare Other | Admitting: Cardiology

## 2012-06-13 ENCOUNTER — Ambulatory Visit (INDEPENDENT_AMBULATORY_CARE_PROVIDER_SITE_OTHER): Payer: Medicare Other | Admitting: *Deleted

## 2012-06-13 DIAGNOSIS — I4891 Unspecified atrial fibrillation: Secondary | ICD-10-CM

## 2012-06-13 LAB — POCT INR: INR: 2.4

## 2012-06-23 ENCOUNTER — Other Ambulatory Visit: Payer: Self-pay | Admitting: Cardiology

## 2012-06-25 ENCOUNTER — Other Ambulatory Visit: Payer: Self-pay | Admitting: *Deleted

## 2012-06-25 MED ORDER — METOPROLOL TARTRATE 25 MG PO TABS: 12.5000 mg | ORAL_TABLET | Freq: Two times a day (BID) | ORAL | Status: DC

## 2012-07-02 ENCOUNTER — Other Ambulatory Visit: Payer: Self-pay | Admitting: *Deleted

## 2012-07-02 ENCOUNTER — Other Ambulatory Visit: Payer: Self-pay | Admitting: Cardiology

## 2012-07-04 ENCOUNTER — Ambulatory Visit (INDEPENDENT_AMBULATORY_CARE_PROVIDER_SITE_OTHER): Payer: Medicare Other | Admitting: Cardiology

## 2012-07-04 ENCOUNTER — Encounter: Payer: Self-pay | Admitting: Cardiology

## 2012-07-04 ENCOUNTER — Ambulatory Visit (INDEPENDENT_AMBULATORY_CARE_PROVIDER_SITE_OTHER): Payer: Medicare Other | Admitting: *Deleted

## 2012-07-04 VITALS — BP 126/62 | HR 109 | Ht 63.0 in | Wt 209.0 lb

## 2012-07-04 DIAGNOSIS — R0602 Shortness of breath: Secondary | ICD-10-CM

## 2012-07-04 DIAGNOSIS — I4891 Unspecified atrial fibrillation: Secondary | ICD-10-CM

## 2012-07-04 DIAGNOSIS — E785 Hyperlipidemia, unspecified: Secondary | ICD-10-CM

## 2012-07-04 DIAGNOSIS — I5032 Chronic diastolic (congestive) heart failure: Secondary | ICD-10-CM

## 2012-07-04 LAB — POCT INR: INR: 1.9

## 2012-07-04 MED ORDER — BISOPROLOL FUMARATE 10 MG PO TABS: 10.0000 mg | ORAL_TABLET | Freq: Every day | ORAL | Status: DC

## 2012-07-04 MED ORDER — TORSEMIDE 20 MG PO TABS: ORAL_TABLET | ORAL | Status: DC

## 2012-07-04 NOTE — Patient Instructions (Addendum)
Increase torsemide to 80mg  daily. This will be 4 of your 20mg  tablets daily at the same time in the morning.  Stop metoprolol.  Start bisoprolol 10mg  daily.   Your physician recommends that you return for a FASTING lipid profile /BMET/BNP in 10 days.  Your physician recommends that you schedule a follow-up appointment in: 6 weeks with Dr Shirlee Latch.

## 2012-07-05 NOTE — Progress Notes (Signed)
Patient ID: [name], female   DOB: 12/06/1930, 79 y.o.   MRN: [id number] PCP: Esau Grew in [place].  79 yo with history of atrial fibrillation, COPD, CKD and chronic diastolic CHF presents for cardiology followup. She was admitted 12/11 to 2/13 at [facility] for diverticulitis and had a prolonged hospitalization involving intubation, etc.  Since then, she has been splitting time between her daughter's house in [place] and her son in [place].  She now appears to be in permanent atrial fibrillation.  She is on home oxygen for her COPD.  She has been significantly volume overloaded and I increased her diuretic regimen with torsemide and weekly metolazone.  She is now no longer taking metolazone due to rise in creatinine. She saw Dr. Delford Field recently and is being treated for COPD exacerbation with prednisone.   Since seeing Dr. Delford Field and getting treatment for COPD exacerbation, her breathing is better.  Weight is up 5 lbs since last appointment.  She is short of breath after walking about 50 feet with her walker.  No chest pain.  No PND.    ECG: atrial fibrillation at 109, poor anterior R wave progression, right axis.     Labs (8/10): K 4.2, creatinine 2.8  Labs (11/10): creatinine 2.39, TGs 558, HDL 23  Labs (4/11): creatinine 2.05, K 4.4, LFTs normal, TGs 406, HDL 23  Labs (12/11): creatinine 2.5, K 3.9, LFTs normal, HCT 31.3  Labs (11/13): K 4.1, creatinine 1.8, BNP 603 Labs (12/13): K 4.6, creatinine 1.9, BNP 304 Labs (1/14): K 4.3, creatinine 2.3, BUN 101 Labs (2/14): K 4.5, creatinine 1.9  Allergies (verified):  1) ! Sulfa   Family History:  No FH premature CAD  emphysema: mother  asthma: mother   Social History:  2 children, now lives with son in [place]. Daughter, NP, is involved  Tobacco Use - Former. quit 1970, started at age 65. 1 ppd  pt is retired. previously worked as a Production designer, theatre/television/film for [employer].   ROS: All systems reviewed and negative except as per  HPI.   Past Medical History:  1. Type II diabetes  2. Obesity.  3. COPD. PFTs (12/10): FVC 67%, FEV1 58%, ratio 60%, TLC 95%, RV 138%, DLCO 43% (moderate obstructive defect). Now on home oxygen.  4. Diastolic congestive heart failure. Echo (11/13) with EF 60-65%, mild LVH, moderate biatrial enlargement.  5. Atrial fibrillation. 07/2008, failed cardioversion with TEE guidance in May 2010. Patient went back into NSR by 8/10 with amiodarone.  She was in chronic atrial fibrillation at the time of office visit in 12/13 and is off amiodarone.  6. Chronic kidney disease: ANCA+, pauci-immune glomerulonephritis.  She does not want HD or nephrology followup.  7. History of pneumonia with ARDS in 2005, MRSA PNA with prolonged hospitalization in [facility] in 11/11.  8. Jejunal diverticulum, s/p perf 07/2008  9. Possible obstructive sleep apnea. When the patient was sedated for TEE in May 2010, she did develop some upper airway obstruction. Apparently, however, she has had a negative sleep study.  10. Adenosine myoview (5/10): Normal LV EF and no scar or ischemia (normal stress).  11. Anemia of chronic disease  12. Hypothyroidism  13. Gout  14. Prolonged hospitalization at [facility] 12/12 - 2/13 with diverticulitis.    Current Outpatient Prescriptions  Medication Sig Dispense Refill  . albuterol (PROVENTIL) (2.5 MG/3ML) 0.083% nebulizer solution Take 2.5 mg by nebulization 2 (two) times daily.       Marland Kitchen guaifenesin (HUMIBID E)  400 MG TABS Take 400 mg by mouth 3 (three) times daily.      . hydrALAZINE (APRESOLINE) 50 MG tablet Take 1 tablet (50 mg total) by mouth every 8 (eight) hours.  90 tablet  1  . levofloxacin (LEVAQUIN) 500 MG tablet Take 1 tablet (500 mg total) by mouth daily.  5 tablet  0  . levothyroxine (SYNTHROID, LEVOTHROID) 50 MCG tablet Take 50 mcg by mouth daily.      . metoprolol tartrate (LOPRESSOR) 25 MG tablet TAKE ONE TABLET BY MOUTH TWICE DAILY  60 tablet  5  . Multiple Vitamin  (MULITIVITAMIN WITH MINERALS) TABS Take 1 tablet by mouth daily.      . potassium chloride SA (K-DUR,KLOR-CON) 20 MEQ tablet 1 daily      . predniSONE (DELTASONE) 10 MG tablet Take 10 mg by mouth daily.      . ranitidine (ZANTAC) 150 MG capsule Take 150 mg by mouth every evening.        Marland Kitchen Respiratory Therapy Supplies (FLUTTER) DEVI by Does not apply route. Use as directed      . simvastatin (ZOCOR) 20 MG tablet Take 40 mg by mouth daily.      Marland Kitchen tiotropium (SPIRIVA HANDIHALER) 18 MCG inhalation capsule Place 1 capsule (18 mcg total) into inhaler and inhale daily.  30 capsule  11  . tiotropium (SPIRIVA HANDIHALER) 18 MCG inhalation capsule Place 1 capsule (18 mcg total) into inhaler and inhale daily.  20 capsule  0  . torsemide (DEMADEX) 20 MG tablet 4 tablets(total 80mg ) daily at the same time in the morning  240 tablet  6  . traMADol (ULTRAM) 50 MG tablet Take 1 tablet (50 mg total) by mouth every 6 (six) hours as needed.  50 tablet  5  . warfarin (COUMADIN) 1 MG tablet Take 1 mg by mouth every morning. As directed      . warfarin (COUMADIN) 3 MG tablet Take 1 tablet (3 mg total) by mouth as directed.  30 tablet  3  . bisoprolol (ZEBETA) 10 MG tablet Take 1 tablet (10 mg total) by mouth daily.  30 tablet  6  . [DISCONTINUED] albuterol-ipratropium (COMBIVENT) 18-103 MCG/ACT inhaler Inhale 2 puffs into the lungs 2 (two) times daily.      . [DISCONTINUED] colchicine 0.6 MG tablet Take 0.6 mg by mouth daily.      . [DISCONTINUED] famotidine (PEPCID) 20 MG tablet Take 1 tablet (20 mg total) by mouth at bedtime.  30 tablet  1  . [DISCONTINUED] fluticasone (FLOVENT HFA) 110 MCG/ACT inhaler Inhale 2 puffs into the lungs 2 (two) times daily.       No current facility-administered medications for this visit.    BP 126/62  Pulse 109  Ht 5\' 3"  (1.6 m)  Wt 209 lb (94.802 kg)  BMI 37.03 kg/m2 General: NAD, obese.  Neck: Thick, JVP 8-9 cm, no thyromegaly or thyroid nodule.  Lungs: Rhonchi noted CV:  Nondisplaced PMI.  Heart irregular S1/S2, no S3/S4, no murmur.  1+ edema to knees bilaterally (improved).  No carotid bruit.   Abdomen: Soft, nontender, no hepatosplenomegaly, no distention.   Neurologic: Alert and oriented x 3.  Psych: Normal affect. Extremities: No clubbing or cyanosis.   Assessment/Plan: 1. Diastolic CHF:  Mrs Dow looks more volume overloaded today, NYHA class III symptoms (chronic).  - Increase torsemide to 80 mg daily.  - BMET/BNP in 10 days.  2. Atrial fibrillation: Now chronic.  Rate is controlled on a low dose  of metoprolol.  However, given her lung disease, I will stop metoprolol and use a more beta-1 selective agent (bisoprolol 10 mg daily).  She is on warfarin.  She is not a NOAC candidate with significant CKD.  3. CKD: Pauci-immune glomerulonephritis.  I am going to arrange for her to go back to see a nephrologist again. Will need to follow creatinine and BUN very carefully.  4. COPD: On home oxygen.    Marca Ancona 07/05/2012

## 2012-07-09 IMAGING — CR DG CHEST 1V PORT
1 series · 1 of 1 positions shown · non-contrast
Comparison: 04/24/2011

CLINICAL DATA: Shortness of breath

PORTABLE CHEST - 1 VIEW

[ap landscape]
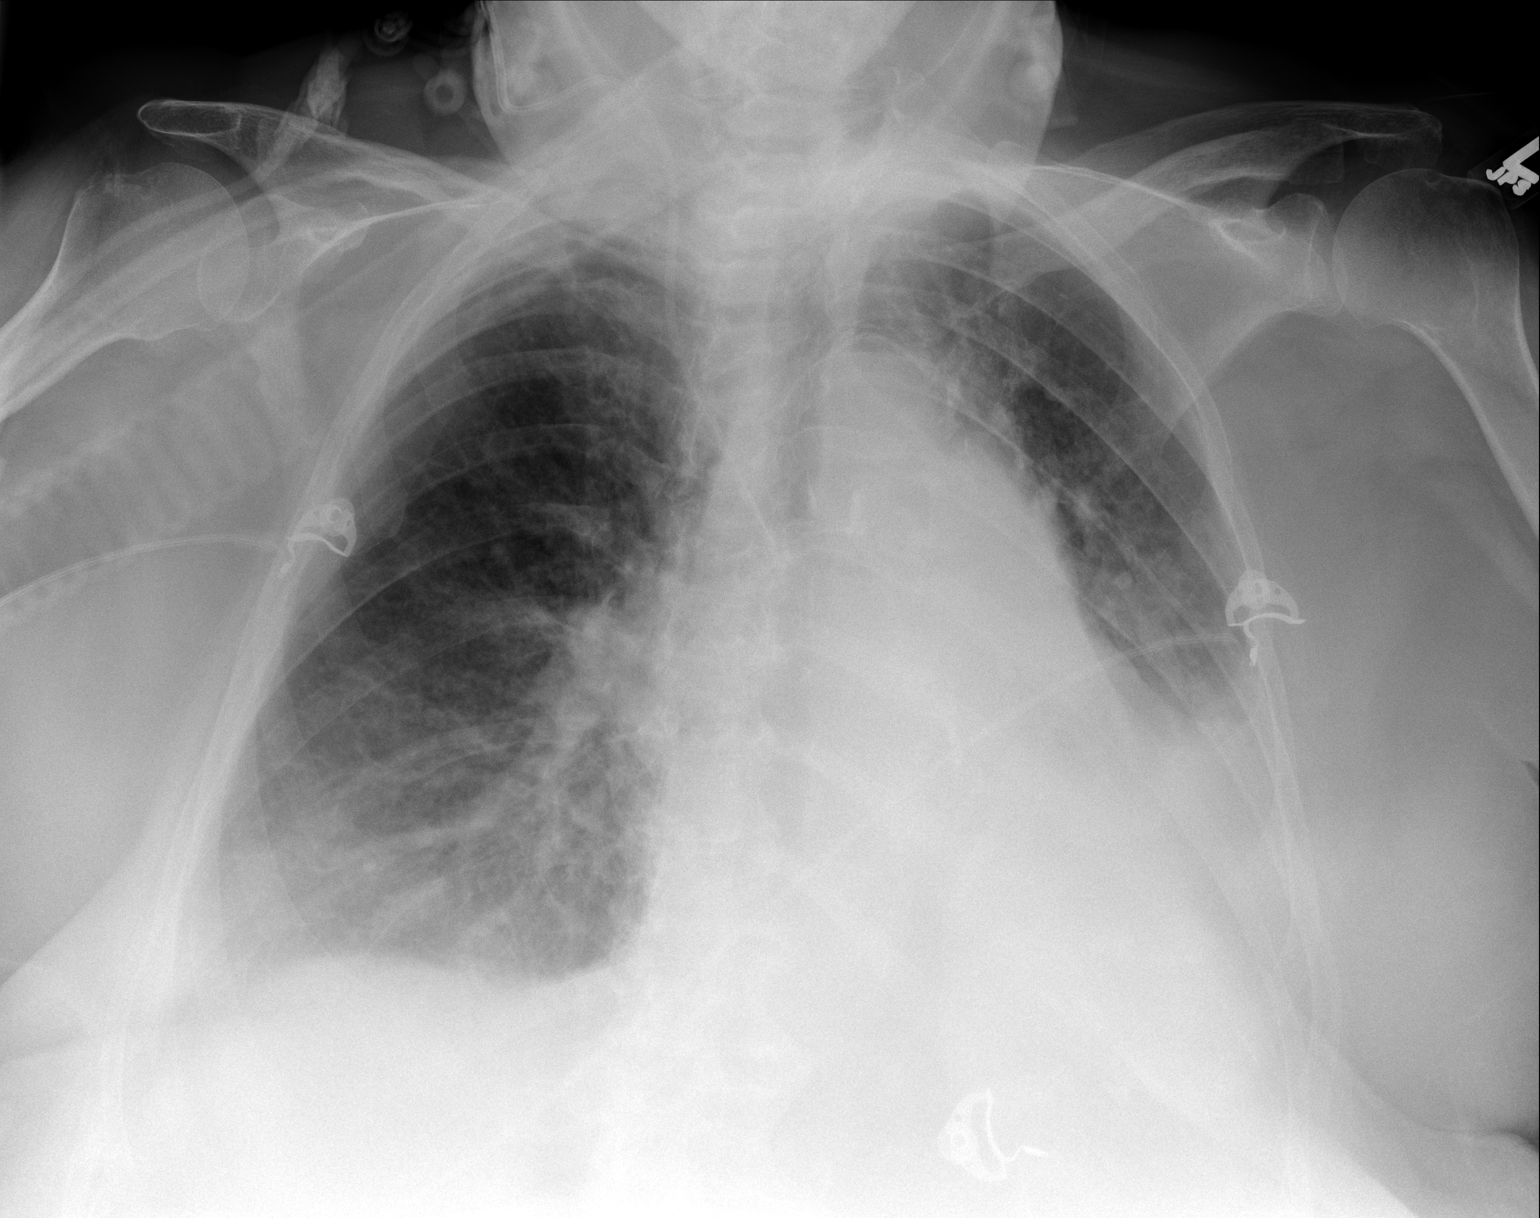

[1 of 1 positions shown; findings below may reference images not displayed]

FINDINGS: Cardiomegaly again noted.  Stable right IJ central line
position.  Stable left pleural effusion and left basilar
atelectasis or infiltrate.  Mild right basilar atelectasis or early
infiltrate.  No convincing pulmonary edema.
IMPRESSION: Stable right IJ central line position.  Stable left pleural
effusion and left basilar atelectasis or infiltrate.  Mild right
basilar atelectasis or early infiltrate.  No convincing pulmonary
edema.

## 2012-07-09 IMAGING — CR DG CHEST 1V PORT
1 series · 1 of 1 positions shown · non-contrast
Comparison: [DATE]

CLINICAL DATA: 72-year-old female with respiratory difficulty -
evaluate endotracheal tube placement.

PORTABLE CHEST - 1 VIEW

[AP]
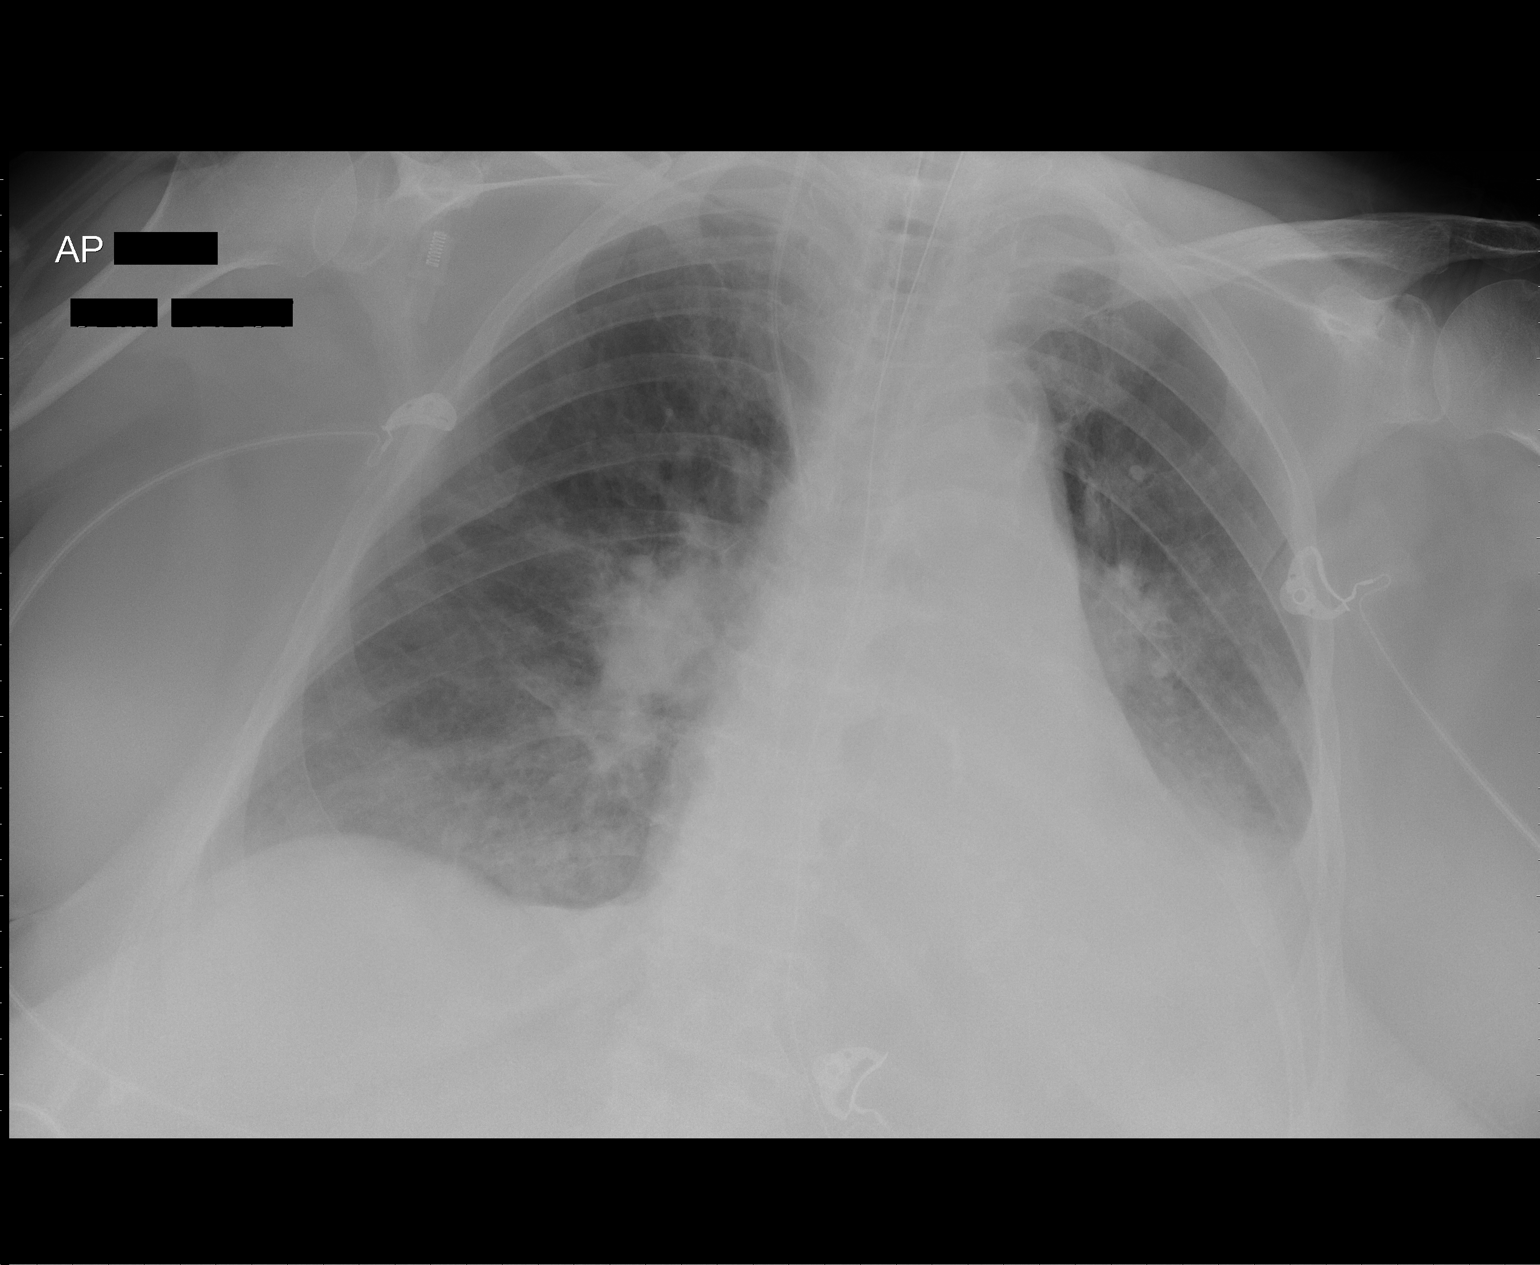

[1 of 1 positions shown; findings below may reference images not displayed]

FINDINGS: An endotracheal tube has been placed and appears in
satisfactory position, 4 cm above the carina.
An NG tube is now noted with tip off the field of view but entering
the stomach.
The right IJ central venous catheter is unchanged with tip
overlying the mid SVC.
Pulmonary vascular congestion is again noted as well as left lower
lung consolidation/atelectasis and mild right basilar
atelectasis/airspace disease.
There is no evidence of pneumothorax.
IMPRESSION: Endotracheal tube placement which appears satisfactory - 4 cm above
the carina.

NG tube placement entering the stomach with tip off from view.

No significant change in left lower lung atelectasis/consolidation
and mild right basilar atelectasis/airspace disease.

## 2012-07-11 ENCOUNTER — Telehealth: Payer: Self-pay | Admitting: Critical Care Medicine

## 2012-07-11 MED ORDER — ALBUTEROL SULFATE (2.5 MG/3ML) 0.083% IN NEBU: 2.5000 mg | INHALATION_SOLUTION | Freq: Two times a day (BID) | RESPIRATORY_TRACT | Status: DC

## 2012-07-11 MED ORDER — PREDNISONE 10 MG PO TABS: 10.0000 mg | ORAL_TABLET | Freq: Every day | ORAL | Status: DC

## 2012-07-11 NOTE — Telephone Encounter (Signed)
Refills sent. Pt is aware. Cicely Ortner, CMA  

## 2012-07-14 IMAGING — CR DG CHEST 1V PORT
1 series · 1 of 1 positions shown · non-contrast
Comparison: 04/29/2011

CLINICAL DATA: Shortness of breath.  Respiratory distress.

PORTABLE CHEST - 1 VIEW

[view not recorded]
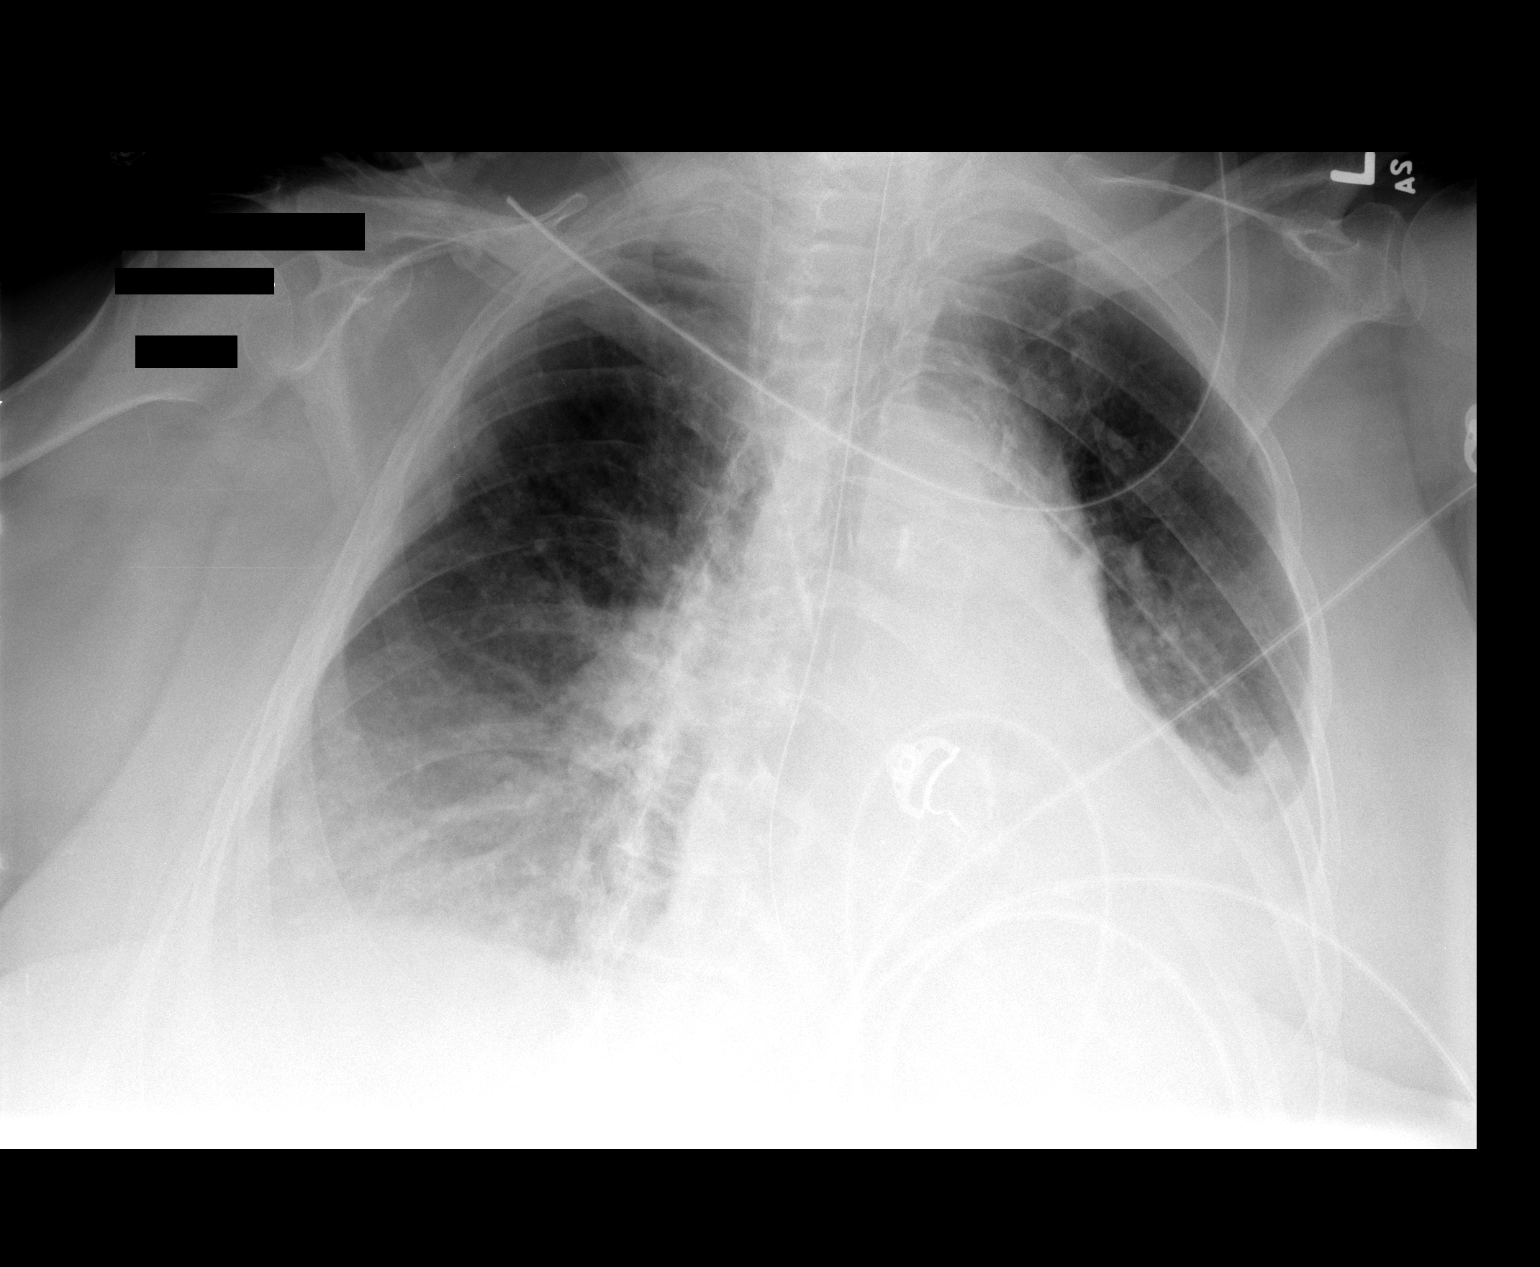

[1 of 1 positions shown; findings below may reference images not displayed]

FINDINGS: The patient is rotated to the left on today's exam,
resulting in reduced diagnostic sensitivity and specificity.
Nasogastric tube extends into the stomach.  Right IJ line tip
projects over the SVC.

Endotracheal tube is no longer well seen and has presumably been
removed.

Continued basilar densities suggesting pleural effusions with left
lower lobe and lingular atelectasis or pneumonia noted.  Aside from
the absence of the endotracheal tube, the appearance is not
significantly changed from 04/29/2011.
IMPRESSION: 1.  Interval extubation.  Otherwise stable radiographic appearance
with suspected bilateral pleural effusions and considerable
atelectasis of the left lower lobe and lingula.

## 2012-07-16 ENCOUNTER — Other Ambulatory Visit: Payer: Medicare Other

## 2012-07-17 ENCOUNTER — Encounter: Payer: Self-pay | Admitting: Family Medicine

## 2012-07-17 IMAGING — CR DG CHEST 1V PORT
1 series · 1 of 1 positions shown · non-contrast
Comparison: 05/03/2011

CLINICAL DATA: Respiratory distress and cough.

PORTABLE CHEST - 1 VIEW

[AP]
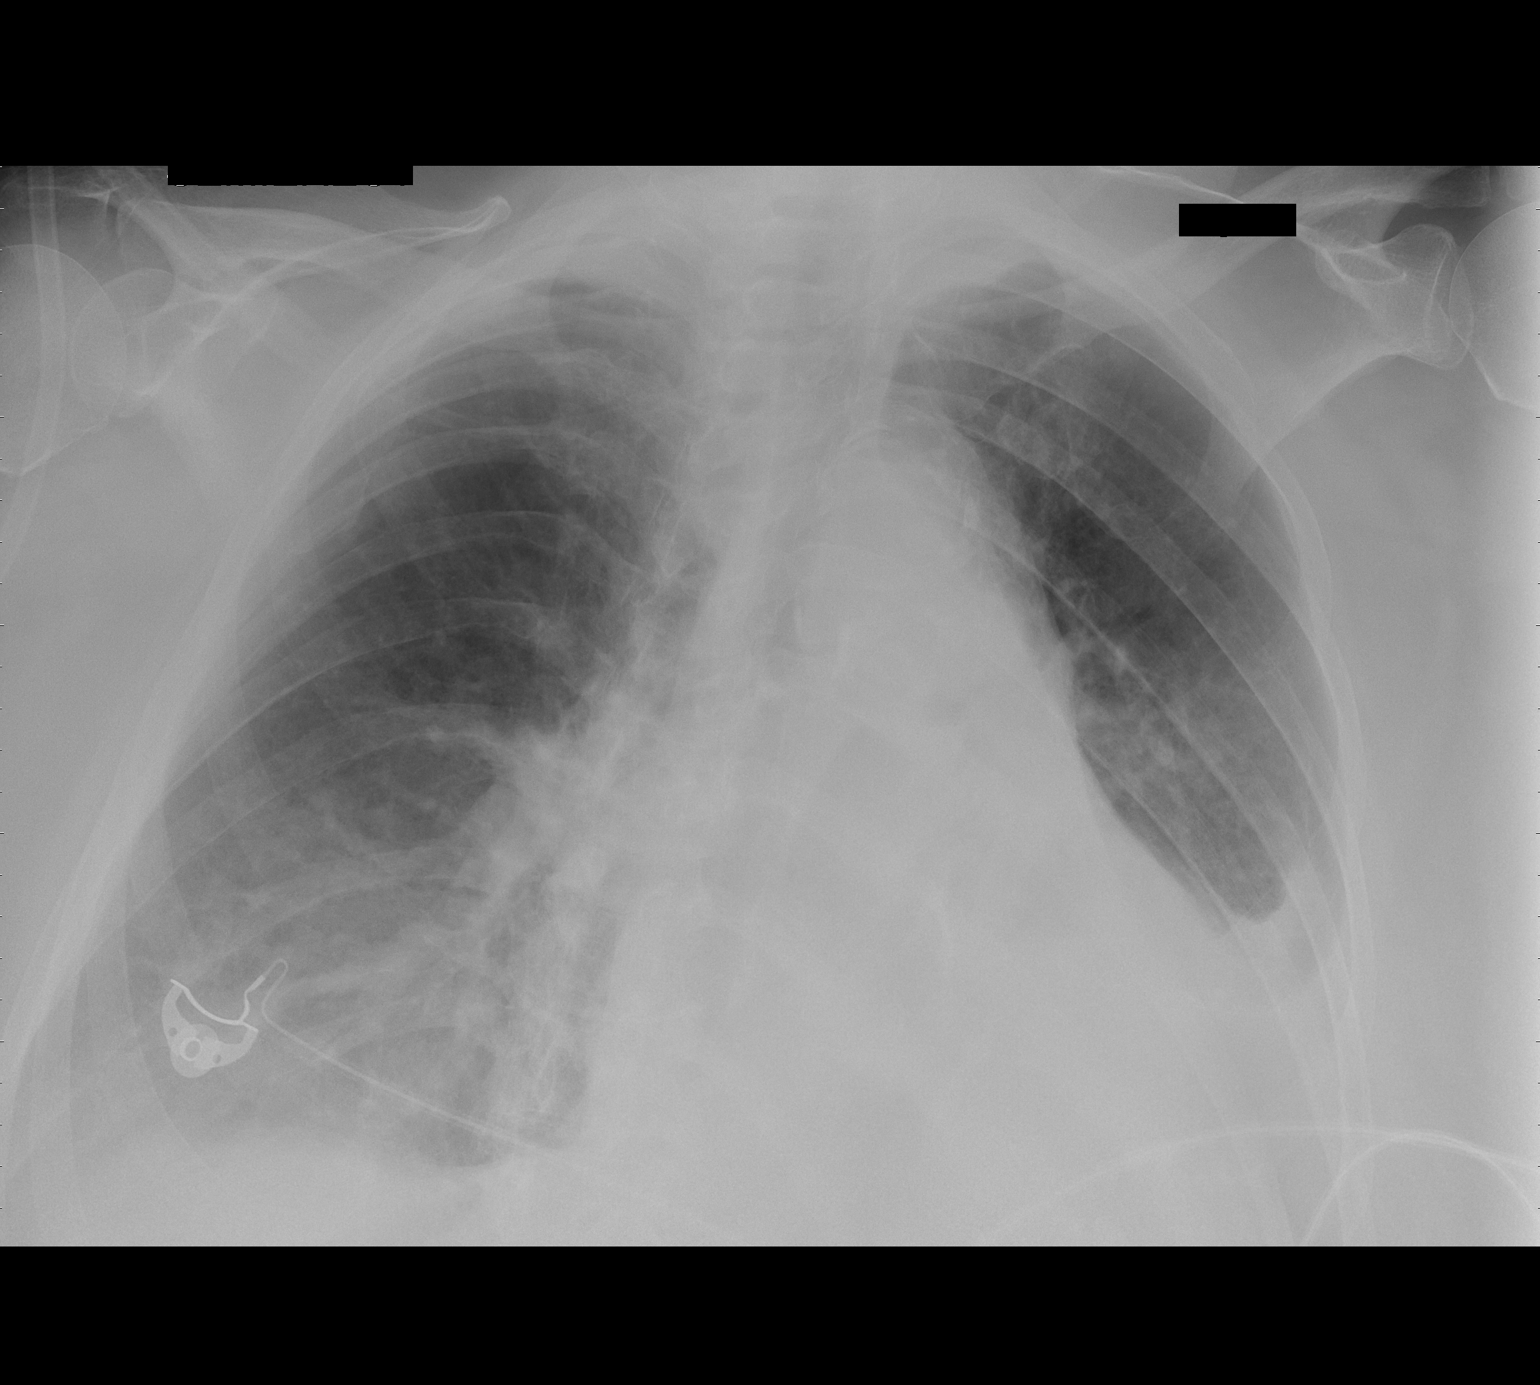

[1 of 1 positions shown; findings below may reference images not displayed]

FINDINGS: 0105 hours. The cardiopericardial silhouette is enlarged.
Persistent bibasilar collapse / consolidation, left greater than
right.  The left pleural effusion persists.  The patient remains
rotated to the left.  Right IJ central line and NG tube have been
removed in the interval. Telemetry leads overlie the chest.
IMPRESSION: Interval removal of right IJ central line and NG tube.

No substantial change in cardiopulmonary exam.

## 2012-07-17 NOTE — Telephone Encounter (Signed)
Call:  Message was sent to me. I thought patient had found new MD - and I have not seen in a year.   If she still plans on seeing me, then I am fine with filling it, #90, 2 refills  But she should come in for 30 min medicare wellness exam. Please schedule.   Hannah Beat, MD 07/17/2012, 12:01 PM

## 2012-07-17 NOTE — Telephone Encounter (Signed)
Patient has found a new dr

## 2012-07-19 ENCOUNTER — Telehealth: Payer: Self-pay | Admitting: *Deleted

## 2012-07-19 MED ORDER — HYDRALAZINE HCL 50 MG PO TABS: 50.0000 mg | ORAL_TABLET | Freq: Three times a day (TID) | ORAL | Status: DC

## 2012-07-19 NOTE — Telephone Encounter (Signed)
Patient's daughter calling to have rx Hydralazine refilled.  #90, [address] [facility] [address]   Micki Riley, CMA

## 2012-07-20 ENCOUNTER — Other Ambulatory Visit: Payer: Self-pay | Admitting: *Deleted

## 2012-07-20 IMAGING — CR DG CHEST 1V PORT
1 series · 1 of 1 positions shown · non-contrast
Comparison: 05/09/2011 at [DATE] a.m.

CLINICAL DATA: 80-year-old female with cough.

PORTABLE CHEST - 1 VIEW

[AP]
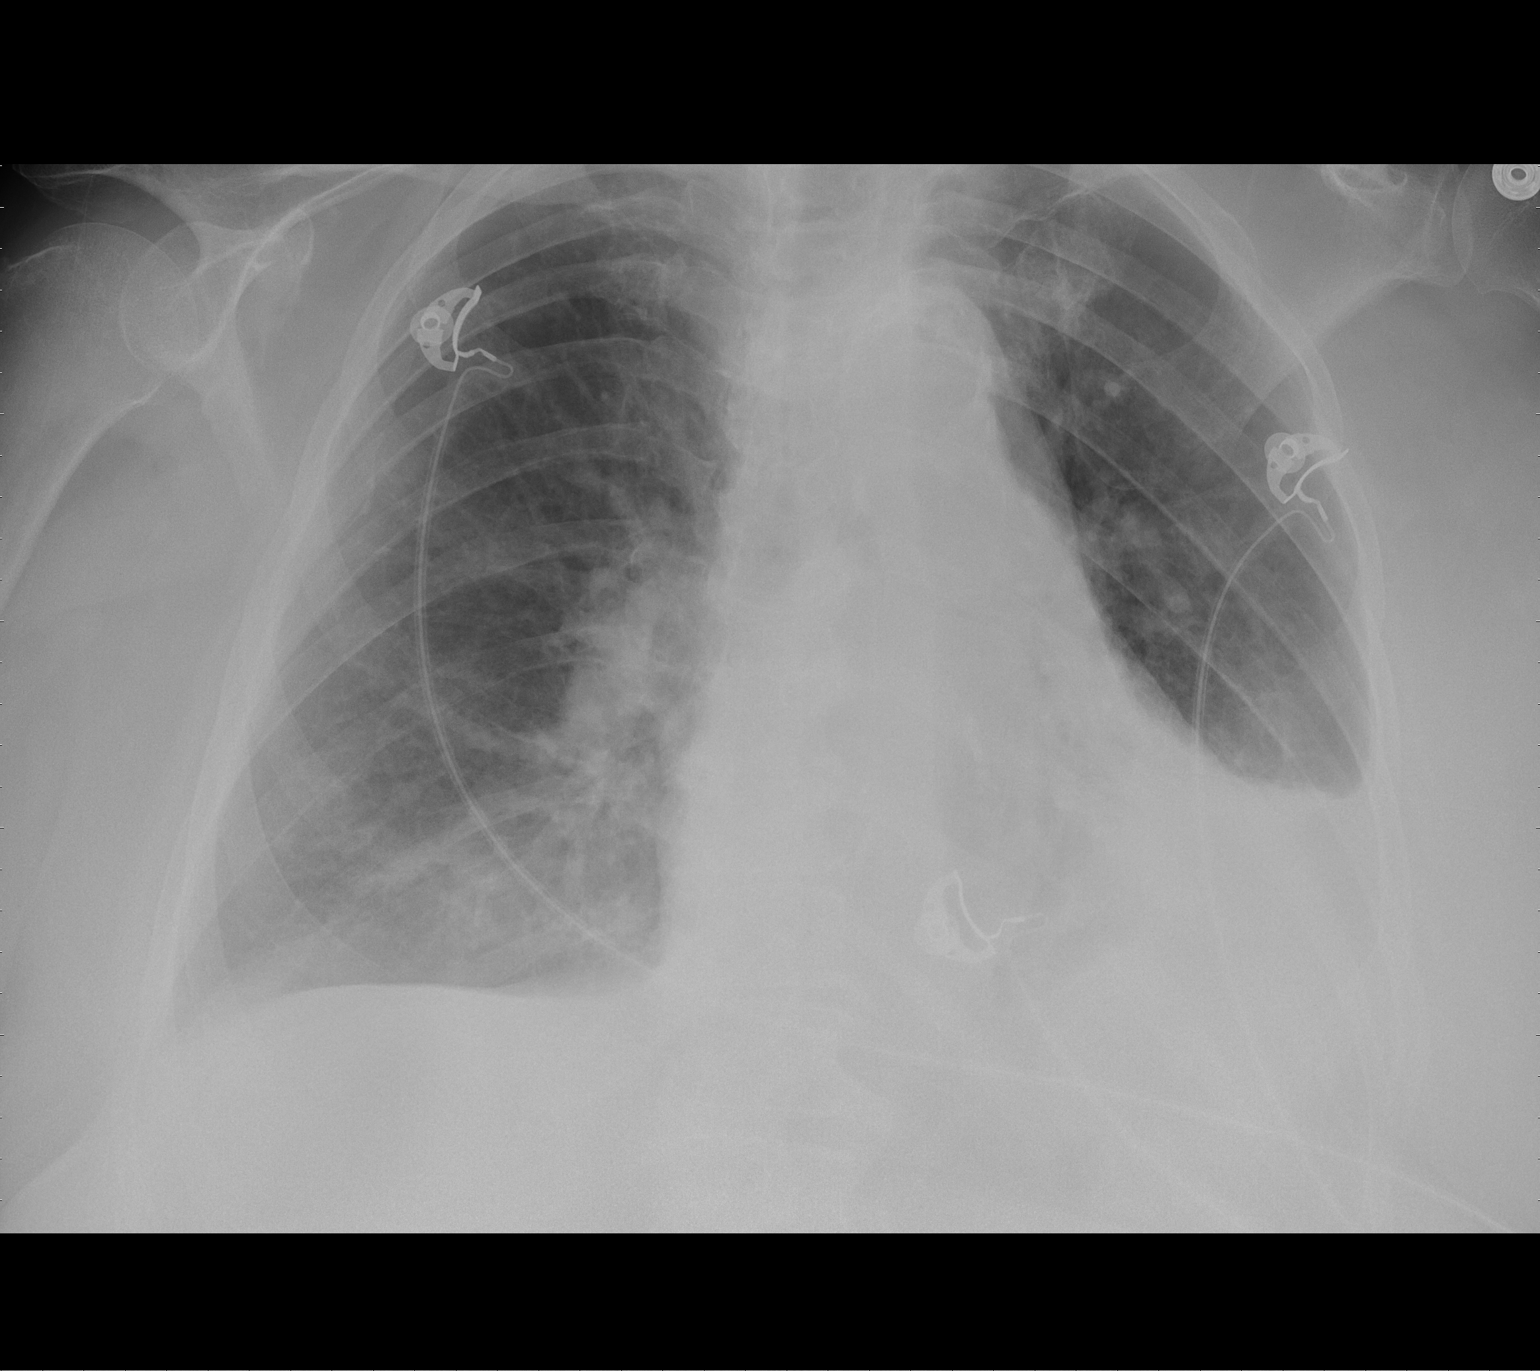

[1 of 1 positions shown; findings below may reference images not displayed]

FINDINGS: Decreased atelectasis/collapse of the left lower lung
noted.
The cardiomediastinal silhouette is otherwise stable.
Probable tiny bilateral pleural effusions noted.
There is no evidence of pneumothorax.
No acute bony abnormalities are present.
IMPRESSION: Improved aeration of the left lung with persistent
atelectasis/consolidation of the left lower lobe.

Probable tiny bilateral pleural effusions.

## 2012-07-20 MED ORDER — HYDRALAZINE HCL 50 MG PO TABS: 50.0000 mg | ORAL_TABLET | Freq: Three times a day (TID) | ORAL | Status: DC

## 2012-07-20 NOTE — Telephone Encounter (Signed)
Pt daughter calling to have pt medication sent to pharmacy. Fax Received. Refill Completed. Bernese Doffing Chowoe (R.M.A)

## 2012-07-25 ENCOUNTER — Other Ambulatory Visit (INDEPENDENT_AMBULATORY_CARE_PROVIDER_SITE_OTHER): Payer: Medicare Other

## 2012-07-25 ENCOUNTER — Ambulatory Visit (INDEPENDENT_AMBULATORY_CARE_PROVIDER_SITE_OTHER): Payer: Medicare Other | Admitting: *Deleted

## 2012-07-25 DIAGNOSIS — I4891 Unspecified atrial fibrillation: Secondary | ICD-10-CM

## 2012-07-25 DIAGNOSIS — R0602 Shortness of breath: Secondary | ICD-10-CM

## 2012-07-25 DIAGNOSIS — I5032 Chronic diastolic (congestive) heart failure: Secondary | ICD-10-CM

## 2012-07-25 LAB — LIPID PANEL
Cholesterol: 106 mg/dL (ref 0–200)
Triglycerides: 272 mg/dL — ABNORMAL HIGH (ref 0.0–149.0)

## 2012-07-25 LAB — BASIC METABOLIC PANEL
BUN: 80 mg/dL — ABNORMAL HIGH (ref 6–23)
CO2: 32 mEq/L (ref 19–32)
Calcium: 9 mg/dL (ref 8.4–10.5)
Chloride: 98 mEq/L (ref 96–112)
Creatinine, Ser: 2.1 mg/dL — ABNORMAL HIGH (ref 0.4–1.2)
Glucose, Bld: 117 mg/dL — ABNORMAL HIGH (ref 70–99)

## 2012-07-25 LAB — POCT INR: INR: 2.3

## 2012-08-13 ENCOUNTER — Ambulatory Visit (INDEPENDENT_AMBULATORY_CARE_PROVIDER_SITE_OTHER): Payer: Medicare Other | Admitting: Cardiology

## 2012-08-13 ENCOUNTER — Encounter: Payer: Self-pay | Admitting: Cardiology

## 2012-08-13 ENCOUNTER — Ambulatory Visit (INDEPENDENT_AMBULATORY_CARE_PROVIDER_SITE_OTHER): Payer: Medicare Other

## 2012-08-13 VITALS — BP 128/62 | HR 66 | Ht 63.0 in | Wt 204.0 lb

## 2012-08-13 DIAGNOSIS — E785 Hyperlipidemia, unspecified: Secondary | ICD-10-CM

## 2012-08-13 DIAGNOSIS — I4891 Unspecified atrial fibrillation: Secondary | ICD-10-CM

## 2012-08-13 DIAGNOSIS — N189 Chronic kidney disease, unspecified: Secondary | ICD-10-CM

## 2012-08-13 DIAGNOSIS — I5032 Chronic diastolic (congestive) heart failure: Secondary | ICD-10-CM

## 2012-08-13 LAB — BASIC METABOLIC PANEL
BUN: 81 mg/dL — ABNORMAL HIGH (ref 6–23)
CO2: 33 mEq/L — ABNORMAL HIGH (ref 19–32)
Chloride: 98 mEq/L (ref 96–112)
Glucose, Bld: 117 mg/dL — ABNORMAL HIGH (ref 70–99)
Potassium: 3.8 mEq/L (ref 3.5–5.1)

## 2012-08-13 NOTE — Progress Notes (Signed)
Patient ID: [name], female   DOB: 30-Jan-1931, 79 y.o.   MRN: [id number] PCP: Esau Grew in [place].  79 yo with history of atrial fibrillation, COPD, CKD and chronic diastolic CHF presents for cardiology followup. She was admitted 12/11 to 2/13 at [facility] for diverticulitis and had a prolonged hospitalization involving intubation, etc.  Since then, she has been splitting time between her daughter's house in [place] and her son in [place].  She now appears to be in permanent atrial fibrillation.  She is on home oxygen for her COPD.  I have been titrating her diuretics for diastolic CHF.  Since last appointment, she has been doing well.  Creatinine has been stable.  Weight is down 5 lbs.  She was able to go to [place] for a wedding recently.  She is able to walk around her house without dyspnea though she gets short of breath with longer distances.   Labs (8/10): K 4.2, creatinine 2.8  Labs (11/10): creatinine 2.39, TGs 558, HDL 23  Labs (4/11): creatinine 2.05, K 4.4, LFTs normal, TGs 406, HDL 23  Labs (12/11): creatinine 2.5, K 3.9, LFTs normal, HCT 31.3  Labs (11/13): K 4.1, creatinine 1.8, BNP 603 Labs (12/13): K 4.6, creatinine 1.9, BNP 304 Labs (1/14): K 4.3, creatinine 2.3, BUN 101 Labs (2/14): K 4.5, creatinine 1.9 Labs (4/14): K 3.7, creatinine 2.1, BNP 399, LDL 50, HDL 21  Allergies (verified):  1) ! Sulfa   Family History:  No FH premature CAD  emphysema: mother  asthma: mother   Social History:  2 children, now lives with son in [place]. Daughter, NP, is involved  Tobacco Use - Former. quit 1970, started at age 34. 1 ppd  pt is retired. previously worked as a Production designer, theatre/television/film for [employer].   Past Medical History:  1. Type II diabetes  2. Obesity.  3. COPD. PFTs (12/10): FVC 67%, FEV1 58%, ratio 60%, TLC 95%, RV 138%, DLCO 43% (moderate obstructive defect). Now on home oxygen.  4. Diastolic congestive heart failure. Echo (11/13) with EF 60-65%, mild  LVH, moderate biatrial enlargement.  5. Atrial fibrillation. 07/2008, failed cardioversion with TEE guidance in May 2010. Patient went back into NSR by 8/10 with amiodarone.  She was in chronic atrial fibrillation at the time of office visit in 12/13 and is off amiodarone.  6. Chronic kidney disease: ANCA+, pauci-immune glomerulonephritis.   7. History of pneumonia with ARDS in 2005, MRSA PNA with prolonged hospitalization in [facility] in 11/11.  8. Jejunal diverticulum, s/p perf 07/2008  9. Possible obstructive sleep apnea. When the patient was sedated for TEE in May 2010, she did develop some upper airway obstruction. Apparently, however, she has had a negative sleep study.  10. Adenosine myoview (5/10): Normal LV EF and no scar or ischemia (normal stress).  11. Anemia of chronic disease  12. Hypothyroidism  13. Gout  14. Prolonged hospitalization at [facility] 12/12 - 2/13 with diverticulitis.    Current Outpatient Prescriptions  Medication Sig Dispense Refill  . albuterol (PROVENTIL) (2.5 MG/3ML) 0.083% nebulizer solution Take 3 mLs (2.5 mg total) by nebulization 2 (two) times daily. Dx: 492.8 COPD with Emphysema  270 mL  2  . bisoprolol (ZEBETA) 10 MG tablet Take 1 tablet (10 mg total) by mouth daily.  30 tablet  6  . guaifenesin (HUMIBID E) 400 MG TABS Take 400 mg by mouth 3 (three) times daily.      . hydrALAZINE (APRESOLINE) 50 MG tablet Take 1  tablet (50 mg total) by mouth every 8 (eight) hours.  90 tablet  5  . levothyroxine (SYNTHROID, LEVOTHROID) 50 MCG tablet Take 50 mcg by mouth daily.      . Multiple Vitamin (MULITIVITAMIN WITH MINERALS) TABS Take 1 tablet by mouth daily.      . potassium chloride SA (K-DUR,KLOR-CON) 20 MEQ tablet 1 daily      . predniSONE (DELTASONE) 10 MG tablet Take 1 tablet (10 mg total) by mouth daily.  30 tablet  1  . ranitidine (ZANTAC) 150 MG capsule Take 150 mg by mouth every evening.        Marland Kitchen Respiratory Therapy Supplies (FLUTTER) DEVI by Does not apply  route. Use as directed      . simvastatin (ZOCOR) 20 MG tablet Take 40 mg by mouth daily.      Marland Kitchen tiotropium (SPIRIVA HANDIHALER) 18 MCG inhalation capsule Place 1 capsule (18 mcg total) into inhaler and inhale daily.  30 capsule  11  . tiotropium (SPIRIVA HANDIHALER) 18 MCG inhalation capsule Place 1 capsule (18 mcg total) into inhaler and inhale daily.  20 capsule  0  . torsemide (DEMADEX) 20 MG tablet 4 tablets(total 80mg ) daily at the same time in the morning  240 tablet  6  . traMADol (ULTRAM) 50 MG tablet Take 1 tablet (50 mg total) by mouth every 6 (six) hours as needed.  50 tablet  5  . warfarin (COUMADIN) 1 MG tablet Take 1 mg by mouth every morning. As directed      . warfarin (COUMADIN) 3 MG tablet Take 1 tablet (3 mg total) by mouth as directed.  30 tablet  3  . [DISCONTINUED] albuterol-ipratropium (COMBIVENT) 18-103 MCG/ACT inhaler Inhale 2 puffs into the lungs 2 (two) times daily.      . [DISCONTINUED] colchicine 0.6 MG tablet Take 0.6 mg by mouth daily.      . [DISCONTINUED] famotidine (PEPCID) 20 MG tablet Take 1 tablet (20 mg total) by mouth at bedtime.  30 tablet  1  . [DISCONTINUED] fluticasone (FLOVENT HFA) 110 MCG/ACT inhaler Inhale 2 puffs into the lungs 2 (two) times daily.       No current facility-administered medications for this visit.    BP 128/62  Pulse 66  Ht 5\' 3"  (1.6 m)  Wt 204 lb (92.534 kg)  BMI 36.15 kg/m2  SpO2 90% General: NAD, obese.  Neck: Thick, JVP not elevated, no thyromegaly or thyroid nodule.  Lungs: Slight crackles at bases bilaterally.  CV: Nondisplaced PMI.  Heart irregular S1/S2, no S3/S4, no murmur.  1+ ankle edema (improved).  No carotid bruit.   Abdomen: Soft, nontender, no hepatosplenomegaly, no distention.   Neurologic: Alert and oriented x 3.  Psych: Normal affect. Extremities: No clubbing or cyanosis.   Assessment/Plan: 1. Diastolic CHF:  Mrs Boline seems to be doing better.  Still NYHA class III but improved.  Weight is down,  JVP not elevated.  Creatinine has been stable.  - Repeat BMET today.  - Continue current torsemide dose. 2. Atrial fibrillation: Now chronic.  Rate is controlled with bisoprolol. She is on warfarin.  She is not a NOAC candidate with significant CKD.  3. CKD: Pauci-immune glomerulonephritis.  She is going back to see nephrology.  4. COPD: On home oxygen.    Marca Ancona 08/13/2012

## 2012-08-13 NOTE — Patient Instructions (Addendum)
Your physician recommends that you have  lab work today--BMET.   Your physician recommends that you schedule a follow-up appointment in: 3 months with Dr McLean.   

## 2012-08-17 ENCOUNTER — Other Ambulatory Visit: Payer: Self-pay | Admitting: Critical Care Medicine

## 2012-08-30 ENCOUNTER — Encounter: Payer: Self-pay | Admitting: Critical Care Medicine

## 2012-08-30 ENCOUNTER — Other Ambulatory Visit (HOSPITAL_COMMUNITY): Payer: Self-pay

## 2012-08-30 ENCOUNTER — Other Ambulatory Visit (HOSPITAL_COMMUNITY): Payer: Self-pay | Admitting: *Deleted

## 2012-08-30 ENCOUNTER — Ambulatory Visit (INDEPENDENT_AMBULATORY_CARE_PROVIDER_SITE_OTHER): Payer: Medicare Other | Admitting: Critical Care Medicine

## 2012-08-30 VITALS — BP 110/70 | HR 64 | Temp 98.0°F | Ht 62.0 in | Wt 205.0 lb

## 2012-08-30 DIAGNOSIS — J9610 Chronic respiratory failure, unspecified whether with hypoxia or hypercapnia: Secondary | ICD-10-CM

## 2012-08-30 DIAGNOSIS — N189 Chronic kidney disease, unspecified: Secondary | ICD-10-CM

## 2012-08-30 DIAGNOSIS — J438 Other emphysema: Secondary | ICD-10-CM

## 2012-08-30 DIAGNOSIS — J439 Emphysema, unspecified: Secondary | ICD-10-CM

## 2012-08-30 MED ORDER — TIOTROPIUM BROMIDE MONOHYDRATE 18 MCG IN CAPS: 18.0000 ug | ORAL_CAPSULE | Freq: Every day | RESPIRATORY_TRACT | Status: DC

## 2012-08-30 NOTE — Assessment & Plan Note (Signed)
Chronic respiratory failure oxygen dependent Maintain oxygen therapy as prescribed

## 2012-08-30 NOTE — Assessment & Plan Note (Signed)
Gold stage C. COPD oxygen dependent Stable at this time Plan Maintain inhaled medications as prescribed

## 2012-08-30 NOTE — Patient Instructions (Addendum)
No changes in medications Return 4 months 

## 2012-08-30 NOTE — Progress Notes (Signed)
Subjective:    Patient ID: [name], female    DOB: [birth year], 79 y.o.   MRN: [id number]  HPI  08/30/2012 Since the last visit in January the patient has actually done very well. The patient's cough is under good control. There is less shortness of breath. The patient's maintain inhaled medications and oxygen as prescribed. Pt denies any significant sore throat, nasal congestion or excess secretions, fever, chills, sweats, unintended weight loss, pleurtic or exertional chest pain, orthopnea PND, or leg swelling Pt denies any increase in rescue therapy over baseline, denies waking up needing it or having any early am or nocturnal exacerbations of coughing/wheezing/or dyspnea. Pt also denies any obvious fluctuation in symptoms with  weather or environmental change or other alleviating or aggravating factors     Past Medical History  Diagnosis Date  . Diabetes mellitus   . Obesity   . COPD (chronic obstructive pulmonary disease)     Moderate. PFTs (12/10): FVC 67%, FEV1 58%, ratio 60%, TLC 95%, RV 138%, DLCO 43% (moderate obstructive defect). She is on home oxygen that should be worn at all times.  . Diastolic CHF, chronic     Echo (3/12): EF 55-60%, mild LV hypertrophy, grade I diastolic dysfunction, mild left atrial enlargement, normal pulmonary artery pressure  . CKD (chronic kidney disease)     Cr 2.4 when last checked. 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 PNA (pneumonia)     h/o with ARDS in 2005; MRSA PNA with prolonged hospitalization in [facility] 11/11  . Diverticulum     Jejunal  . OSA (obstructive sleep apnea)     possible. When pt was sedated for TEE in May 2010, she did develop some upper airway obstruction  . Anemia     of chronic disease  . Hypothyroidism   . Gout   . Atrial fibrillation 07/2008    Failed cardioversion with TEE guidance in May 2010. Pt went back to NSR by 11/2008 with amiodarne  . Hypertension   . GERD  (gastroesophageal reflux disease)   . Peripheral vascular disease   . Pneumonia   . Chronic kidney disease   . Respiratory failure, chronic 07/01/2011  . Chronic gout due to renal impairment 07/01/2011     Family History  Problem Relation Age of Onset  . Emphysema Mother     and asthma  . Asthma Mother   . Heart disease Neg Hx     premature     History   Social History  . Marital Status: Married    Spouse Name: N/A    Number of Children: N/A  . Years of Education: N/A   Occupational History  . Retired     Previously worked as Production designer, theatre/television/film for [employer]   Social History Main Topics  . Smoking status: Former Smoker -- 1.00 packs/day for 23 years    Types: Cigarettes    Quit date: 04/11/1976  . Smokeless tobacco: Never Used     Comment: Started at age 36 (quit at age 8)  . Alcohol Use: No  . Drug Use: No  . Sexually Active: Not Currently   Other Topics Concern  . Not on file   Social History Narrative   Still living at home, married and has at least a daughter [name] [phone] and a son. Daughter is an NP. Pt emergency contact per [name] is [name] (sister) [phone]     Allergies  Allergen Reactions  . Sulfonamide  Derivatives     Childhood reaction     Outpatient Prescriptions Prior to Visit  Medication Sig Dispense Refill  . albuterol (PROVENTIL) (2.5 MG/3ML) 0.083% nebulizer solution Take 3 mLs (2.5 mg total) by nebulization 2 (two) times daily. Dx: 492.8 COPD with Emphysema  270 mL  2  . bisoprolol (ZEBETA) 10 MG tablet Take 1 tablet (10 mg total) by mouth daily.  30 tablet  6  . guaifenesin (HUMIBID E) 400 MG TABS Take 400 mg by mouth 3 (three) times daily.      . hydrALAZINE (APRESOLINE) 50 MG tablet Take 1 tablet (50 mg total) by mouth every 8 (eight) hours.  90 tablet  5  . levothyroxine (SYNTHROID, LEVOTHROID) 50 MCG tablet Take 50 mcg by mouth daily.      . Multiple Vitamin (MULITIVITAMIN WITH MINERALS) TABS Take 1 tablet by mouth daily.       . potassium chloride SA (K-DUR,KLOR-CON) 20 MEQ tablet 1 daily      . predniSONE (DELTASONE) 10 MG tablet TAKE ONE TABLET BY MOUTH ONCE DAILY  30 tablet  5  . ranitidine (ZANTAC) 150 MG capsule Take 150 mg by mouth every evening.        Marland Kitchen Respiratory Therapy Supplies (FLUTTER) DEVI by Does not apply route. Use as directed      . simvastatin (ZOCOR) 20 MG tablet Take 40 mg by mouth daily.      Marland Kitchen torsemide (DEMADEX) 20 MG tablet 4 tablets(total 80mg ) daily at the same time in the morning  240 tablet  6  . warfarin (COUMADIN) 3 MG tablet Take 1 tablet (3 mg total) by mouth as directed.  30 tablet  3  . tiotropium (SPIRIVA HANDIHALER) 18 MCG inhalation capsule Place 1 capsule (18 mcg total) into inhaler and inhale daily.  30 capsule  11  . tiotropium (SPIRIVA HANDIHALER) 18 MCG inhalation capsule Place 1 capsule (18 mcg total) into inhaler and inhale daily.  20 capsule  0  . traMADol (ULTRAM) 50 MG tablet Take 1 tablet (50 mg total) by mouth every 6 (six) hours as needed.  50 tablet  5  . warfarin (COUMADIN) 1 MG tablet Take 1 mg by mouth every morning. As directed       No facility-administered medications prior to visit.       Review of Systems  Constitutional:   No  weight loss, night sweats,  Fevers, chills, fatigue, lassitude. HEENT:   No headaches,  Difficulty swallowing,  Tooth/dental problems,  Sore throat,                No sneezing, itching, ear ache, nasal congestion, post nasal drip,   CV:  No chest pain,  Orthopnea, PND, no swelling in lower extremities,  No anasarca, dizziness, palpitations  GI  No heartburn, indigestion, abdominal pain, nausea, vomiting, diarrhea, change in bowel habits, loss of appetite  Resp: ++shortness of breath with exertion not  at rest.  No  excess mucus, no  productive cough,  Notes non-productive cough,  No coughing up of blood.  No  change in color of mucus.  No wheezing.  No chest wall deformity  Skin: no rash or lesions.  GU: no dysuria,  change in color of urine, no urgency or frequency.  No flank pain.  MS:  No joint pain or swelling.  No decreased range of motion.  No back pain.  Psych:  No change in mood or affect. No depression or anxiety.  No memory loss.  Objective:   Physical Exam  Filed Vitals:   08/30/12 0937  BP: 110/70  Pulse: 64  Temp: 98 F (36.7 C)  TempSrc: Oral  Height: 5\' 2"  (1.575 m)  Weight: 205 lb (92.987 kg)  SpO2: 97%    Gen: Pleasant, well-nourished, in no distress,  normal affect  ENT: No lesions,  mouth clear,  oropharynx clear, no postnasal drip  Neck: No JVD, no TMG, no carotid bruits  Lungs: No use of accessory muscles, no dullness to percussion, rhonchi right lower lobe, Improved airflow with decreased wheezes  Cardiovascular: RRR, heart sounds normal, no murmur or gallops, 1+  peripheral edema in LE  Abdomen: soft and NT, no HSM,  BS normal  Musculoskeletal: No deformities, no cyanosis or clubbing  Neuro: alert, non focal  Skin: Warm, no lesions or rashes          Assessment & Plan:   COPD with emphysema Gold stage C. COPD oxygen dependent Stable at this time Plan Maintain inhaled medications as prescribed  Respiratory failure, chronic Chronic respiratory failure oxygen dependent Maintain oxygen therapy as prescribed    Updated Medication List Outpatient Encounter Prescriptions as of 08/30/2012  Medication Sig Dispense Refill  . acetaminophen (TYLENOL) 500 MG tablet Take 1,000 mg by mouth 2 (two) times daily as needed for pain.      Marland Kitchen albuterol (PROVENTIL) (2.5 MG/3ML) 0.083% nebulizer solution Take 3 mLs (2.5 mg total) by nebulization 2 (two) times daily. Dx: 492.8 COPD with Emphysema  270 mL  2  . bisoprolol (ZEBETA) 10 MG tablet Take 1 tablet (10 mg total) by mouth daily.  30 tablet  6  . diphenhydrAMINE (BENADRYL) 25 MG tablet Take 25 mg by mouth at bedtime as needed for itching.      Marland Kitchen guaifenesin (HUMIBID E) 400 MG TABS Take 400 mg by mouth 3  (three) times daily.      . hydrALAZINE (APRESOLINE) 50 MG tablet Take 1 tablet (50 mg total) by mouth every 8 (eight) hours.  90 tablet  5  . levothyroxine (SYNTHROID, LEVOTHROID) 50 MCG tablet Take 50 mcg by mouth daily.      . Multiple Vitamin (MULITIVITAMIN WITH MINERALS) TABS Take 1 tablet by mouth daily.      . potassium chloride SA (K-DUR,KLOR-CON) 20 MEQ tablet 1 daily      . predniSONE (DELTASONE) 10 MG tablet TAKE ONE TABLET BY MOUTH ONCE DAILY  30 tablet  5  . ranitidine (ZANTAC) 150 MG capsule Take 150 mg by mouth every evening.        Marland Kitchen Respiratory Therapy Supplies (FLUTTER) DEVI by Does not apply route. Use as directed      . simvastatin (ZOCOR) 20 MG tablet Take 40 mg by mouth daily.      Marland Kitchen tiotropium (SPIRIVA HANDIHALER) 18 MCG inhalation capsule Place 1 capsule (18 mcg total) into inhaler and inhale daily.  30 capsule  11  . torsemide (DEMADEX) 20 MG tablet 4 tablets(total 80mg ) daily at the same time in the morning  240 tablet  6  . warfarin (COUMADIN) 2 MG tablet Take 2 mg by mouth as directed.      . warfarin (COUMADIN) 3 MG tablet Take 1 tablet (3 mg total) by mouth as directed.  30 tablet  3  . [DISCONTINUED] tiotropium (SPIRIVA HANDIHALER) 18 MCG inhalation capsule Place 1 capsule (18 mcg total) into inhaler and inhale daily.  30 capsule  11  . [DISCONTINUED] tiotropium (SPIRIVA HANDIHALER) 18 MCG inhalation capsule  Place 1 capsule (18 mcg total) into inhaler and inhale daily.  20 capsule  0  . [DISCONTINUED] traMADol (ULTRAM) 50 MG tablet Take 1 tablet (50 mg total) by mouth every 6 (six) hours as needed.  50 tablet  5  . [DISCONTINUED] warfarin (COUMADIN) 1 MG tablet Take 1 mg by mouth every morning. As directed       No facility-administered encounter medications on file as of 08/30/2012.

## 2012-08-31 ENCOUNTER — Encounter (HOSPITAL_COMMUNITY)
Admission: RE | Admit: 2012-08-31 | Discharge: 2012-08-31 | Disposition: A | Discharge status: Home / Self Care | Payer: Medicare Other | Source: Ambulatory Visit | Attending: Nephrology | Admitting: Nephrology

## 2012-08-31 DIAGNOSIS — N189 Chronic kidney disease, unspecified: Secondary | ICD-10-CM | POA: Insufficient documentation

## 2012-08-31 DIAGNOSIS — D638 Anemia in other chronic diseases classified elsewhere: Secondary | ICD-10-CM | POA: Insufficient documentation

## 2012-08-31 LAB — FERRITIN: Ferritin: 85 ng/mL (ref 10–291)

## 2012-08-31 LAB — POCT HEMOGLOBIN-HEMACUE: Hemoglobin: 10 g/dL — ABNORMAL LOW (ref 12.0–15.0)

## 2012-08-31 MED ORDER — DARBEPOETIN ALFA-POLYSORBATE 150 MCG/0.3ML IJ SOLN
150.0000 ug | INTRAMUSCULAR | Status: DC
  Administered 2012-08-31: 150 ug via SUBCUTANEOUS

## 2012-08-31 MED ORDER — DARBEPOETIN ALFA-POLYSORBATE 150 MCG/0.3ML IJ SOLN
INTRAMUSCULAR | Status: AC
  Filled 2012-08-31: qty 0.3

## 2012-09-20 ENCOUNTER — Ambulatory Visit (INDEPENDENT_AMBULATORY_CARE_PROVIDER_SITE_OTHER): Payer: Medicare Other | Admitting: Pharmacist

## 2012-09-20 DIAGNOSIS — I4891 Unspecified atrial fibrillation: Secondary | ICD-10-CM

## 2012-09-28 ENCOUNTER — Encounter (HOSPITAL_COMMUNITY): Payer: Medicare Other

## 2012-09-28 ENCOUNTER — Other Ambulatory Visit (HOSPITAL_COMMUNITY): Payer: Self-pay | Admitting: *Deleted

## 2012-10-01 ENCOUNTER — Encounter (HOSPITAL_COMMUNITY)
Admission: RE | Admit: 2012-10-01 | Discharge: 2012-10-01 | Disposition: A | Discharge status: Home / Self Care | Payer: Medicare Other | Source: Ambulatory Visit | Attending: Nephrology | Admitting: Nephrology

## 2012-10-01 DIAGNOSIS — N189 Chronic kidney disease, unspecified: Secondary | ICD-10-CM | POA: Insufficient documentation

## 2012-10-01 DIAGNOSIS — D638 Anemia in other chronic diseases classified elsewhere: Secondary | ICD-10-CM | POA: Insufficient documentation

## 2012-10-01 LAB — POCT HEMOGLOBIN-HEMACUE: Hemoglobin: 10.6 g/dL — ABNORMAL LOW (ref 12.0–15.0)

## 2012-10-01 LAB — IRON AND TIBC
Saturation Ratios: 14 % — ABNORMAL LOW (ref 20–55)
UIBC: 260 ug/dL (ref 125–400)

## 2012-10-01 LAB — FERRITIN: Ferritin: 73 ng/mL (ref 10–291)

## 2012-10-01 MED ORDER — DARBEPOETIN ALFA-POLYSORBATE 150 MCG/0.3ML IJ SOLN
INTRAMUSCULAR | Status: AC
  Administered 2012-10-01: 150 ug via SUBCUTANEOUS
  Filled 2012-10-01: qty 0.3

## 2012-10-01 MED ORDER — SODIUM CHLORIDE 0.9 % IV SOLN
INTRAVENOUS | Status: DC
  Administered 2012-10-01: 13:00:00 via INTRAVENOUS

## 2012-10-01 MED ORDER — DARBEPOETIN ALFA-POLYSORBATE 150 MCG/0.3ML IJ SOLN: 150.0000 ug | INTRAMUSCULAR | Status: DC

## 2012-10-01 MED ORDER — FERUMOXYTOL INJECTION 510 MG/17 ML: 510.0000 mg | Freq: Once | INTRAVENOUS | Status: DC

## 2012-10-01 MED ORDER — FERUMOXYTOL INJECTION 510 MG/17 ML
INTRAVENOUS | Status: AC
  Administered 2012-10-01: 510 mg via INTRAVENOUS
  Filled 2012-10-01: qty 17

## 2012-10-09 ENCOUNTER — Encounter (HOSPITAL_COMMUNITY)
Admission: RE | Admit: 2012-10-09 | Discharge: 2012-10-09 | Disposition: A | Discharge status: Home / Self Care | Payer: Medicare Other | Source: Ambulatory Visit | Attending: Nephrology | Admitting: Nephrology

## 2012-10-09 DIAGNOSIS — D638 Anemia in other chronic diseases classified elsewhere: Secondary | ICD-10-CM | POA: Insufficient documentation

## 2012-10-09 DIAGNOSIS — N189 Chronic kidney disease, unspecified: Secondary | ICD-10-CM | POA: Insufficient documentation

## 2012-10-09 MED ORDER — SODIUM CHLORIDE 0.9 % IV SOLN
INTRAVENOUS | Status: DC
  Administered 2012-10-09: 09:00:00 via INTRAVENOUS

## 2012-10-09 MED ORDER — FERUMOXYTOL INJECTION 510 MG/17 ML
510.0000 mg | Freq: Once | INTRAVENOUS | Status: AC
  Administered 2012-10-09: 510 mg via INTRAVENOUS

## 2012-10-09 MED ORDER — FERUMOXYTOL INJECTION 510 MG/17 ML
INTRAVENOUS | Status: AC
  Filled 2012-10-09: qty 17

## 2012-10-12 ENCOUNTER — Other Ambulatory Visit: Payer: Self-pay | Admitting: Cardiology

## 2012-10-18 ENCOUNTER — Ambulatory Visit (INDEPENDENT_AMBULATORY_CARE_PROVIDER_SITE_OTHER): Payer: Medicare Other | Admitting: *Deleted

## 2012-10-18 DIAGNOSIS — I4891 Unspecified atrial fibrillation: Secondary | ICD-10-CM

## 2012-10-18 LAB — POCT INR: INR: 2.8

## 2012-11-01 ENCOUNTER — Encounter (HOSPITAL_COMMUNITY)
Admission: RE | Admit: 2012-11-01 | Discharge: 2012-11-01 | Disposition: A | Discharge status: Home / Self Care | Payer: Medicare Other | Source: Ambulatory Visit | Attending: Nephrology | Admitting: Nephrology

## 2012-11-01 LAB — POCT HEMOGLOBIN-HEMACUE: Hemoglobin: 11.4 g/dL — ABNORMAL LOW (ref 12.0–15.0)

## 2012-11-01 MED ORDER — DARBEPOETIN ALFA-POLYSORBATE 150 MCG/0.3ML IJ SOLN
INTRAMUSCULAR | Status: AC
  Administered 2012-11-01: 150 ug via SUBCUTANEOUS
  Filled 2012-11-01: qty 0.3

## 2012-11-01 MED ORDER — DARBEPOETIN ALFA-POLYSORBATE 150 MCG/0.3ML IJ SOLN: 150.0000 ug | INTRAMUSCULAR | Status: DC

## 2012-11-16 ENCOUNTER — Other Ambulatory Visit: Payer: Self-pay | Admitting: Cardiology

## 2012-11-19 ENCOUNTER — Other Ambulatory Visit: Payer: Self-pay | Admitting: *Deleted

## 2012-11-19 ENCOUNTER — Ambulatory Visit (INDEPENDENT_AMBULATORY_CARE_PROVIDER_SITE_OTHER): Payer: Medicare Other | Admitting: *Deleted

## 2012-11-19 DIAGNOSIS — I4891 Unspecified atrial fibrillation: Secondary | ICD-10-CM

## 2012-11-19 LAB — POCT INR: INR: 1.8

## 2012-11-19 MED ORDER — WARFARIN SODIUM 2 MG PO TABS: 2.0000 mg | ORAL_TABLET | ORAL | Status: DC

## 2012-11-19 MED ORDER — WARFARIN SODIUM 3 MG PO TABS: 3.0000 mg | ORAL_TABLET | ORAL | Status: DC

## 2012-11-28 ENCOUNTER — Other Ambulatory Visit (HOSPITAL_COMMUNITY): Payer: Self-pay | Admitting: *Deleted

## 2012-11-29 ENCOUNTER — Encounter (HOSPITAL_COMMUNITY): Payer: Medicare Other

## 2012-12-04 ENCOUNTER — Other Ambulatory Visit: Payer: Self-pay | Admitting: *Deleted

## 2012-12-04 MED ORDER — PREDNISONE 10 MG PO TABS: ORAL_TABLET | ORAL | Status: DC

## 2012-12-05 ENCOUNTER — Ambulatory Visit (INDEPENDENT_AMBULATORY_CARE_PROVIDER_SITE_OTHER): Payer: Medicare Other | Admitting: *Deleted

## 2012-12-05 ENCOUNTER — Encounter (HOSPITAL_COMMUNITY)
Admission: RE | Admit: 2012-12-05 | Discharge: 2012-12-05 | Disposition: A | Discharge status: Home / Self Care | Payer: Medicare Other | Source: Ambulatory Visit | Attending: Nephrology | Admitting: Nephrology

## 2012-12-05 ENCOUNTER — Encounter: Payer: Self-pay | Admitting: Cardiology

## 2012-12-05 ENCOUNTER — Ambulatory Visit (INDEPENDENT_AMBULATORY_CARE_PROVIDER_SITE_OTHER): Payer: Medicare Other | Admitting: Cardiology

## 2012-12-05 VITALS — BP 92/54 | HR 76 | Ht 62.0 in | Wt 203.2 lb

## 2012-12-05 DIAGNOSIS — I5032 Chronic diastolic (congestive) heart failure: Secondary | ICD-10-CM

## 2012-12-05 DIAGNOSIS — D638 Anemia in other chronic diseases classified elsewhere: Secondary | ICD-10-CM | POA: Insufficient documentation

## 2012-12-05 DIAGNOSIS — N189 Chronic kidney disease, unspecified: Secondary | ICD-10-CM | POA: Insufficient documentation

## 2012-12-05 DIAGNOSIS — R0602 Shortness of breath: Secondary | ICD-10-CM

## 2012-12-05 DIAGNOSIS — I4891 Unspecified atrial fibrillation: Secondary | ICD-10-CM

## 2012-12-05 LAB — POCT INR: INR: 1.6

## 2012-12-05 LAB — POCT HEMOGLOBIN-HEMACUE: Hemoglobin: 12.7 g/dL (ref 12.0–15.0)

## 2012-12-05 MED ORDER — DARBEPOETIN ALFA-POLYSORBATE 150 MCG/0.3ML IJ SOLN: 150.0000 ug | INTRAMUSCULAR | Status: DC

## 2012-12-05 NOTE — Patient Instructions (Addendum)
Your physician recommends that you have lab work today--BMET/BNP.   Your physician recommends that you schedule a follow-up appointment in: 4 months with Dr McLean.    

## 2012-12-06 LAB — BASIC METABOLIC PANEL
CO2: 31 mEq/L (ref 19–32)
Chloride: 95 mEq/L — ABNORMAL LOW (ref 96–112)
Glucose, Bld: 186 mg/dL — ABNORMAL HIGH (ref 70–99)
Potassium: 4.6 mEq/L (ref 3.5–5.1)
Sodium: 137 mEq/L (ref 135–145)

## 2012-12-06 NOTE — Progress Notes (Signed)
Patient ID: [name], female   DOB: 1930-08-14, 79 y.o.   MRN: [id number] PCP: [name] in [place].  79 yo with history of atrial fibrillation, COPD, CKD and chronic diastolic CHF presents for cardiology followup. She was admitted 03/21/12 to 05/24/12 at [facility] for diverticulitis and had a prolonged hospitalization involving intubation, etc.  Since then, she has been splitting time between her daughter's house in [place] and her son in [place].  She now appears to be in permanent atrial fibrillation.  She is on home oxygen for her COPD.  I have been titrating her diuretics for diastolic CHF.  Since last appointment, she has been doing well.  Creatinine has been stable.  Weight is down 1 lb.  She is able to walk around her house without dyspnea though she gets short of breath with longer distances. No lightheadedness or falls.  She is seeing Dr. Eliott Burch now for nephrology.followup.   ECG: atrial fibrillation at 86, septal Qs.   Labs (8/10): K 4.2, creatinine 2.8  Labs (11/10): creatinine 2.39, TGs 558, HDL 23  Labs (4/11): creatinine 2.05, K 4.4, LFTs normal, TGs 406, HDL 23  Labs (12/11): creatinine 2.5, K 3.9, LFTs normal, HCT 31.3  Labs (11/13): K 4.1, creatinine 1.8, BNP 603 Labs (12/13): K 4.6, creatinine 1.9, BNP 304 Labs (1/14): K 4.3, creatinine 2.3, BUN 101 Labs (2/14): K 4.5, creatinine 1.9 Labs (4/14): K 3.7, creatinine 2.1, BNP 399, LDL 50, HDL 21 Labs (5/14): K 3.8, BUN 81, creatinine 2.0   Allergies (verified):  1) ! Sulfa   Family History:  No FH premature CAD  emphysema: mother  asthma: mother   Social History:  2 children, now lives with son in [place]. Daughter, NP, is involved  Tobacco Use - Former. quit 1970, started at age 60. 1 ppd  pt is retired. previously worked as a Production designer, theatre/television/film for [employer].   Past Medical History:  1. Type II diabetes  2. Obesity.  3. COPD. PFTs (12/10): FVC 67%, FEV1 58%, ratio 60%, TLC 95%, RV 138%, DLCO 43%  (moderate obstructive defect). Now on home oxygen.  4. Diastolic congestive heart failure. Echo (11/13) with EF 60-65%, mild LVH, moderate biatrial enlargement.  5. Atrial fibrillation. 07/2008, failed cardioversion with TEE guidance in May 2010. Patient went back into NSR by 8/10 with amiodarone.  She was in chronic atrial fibrillation at the time of office visit in 12/13 and is off amiodarone.  6. Chronic kidney disease: ANCA+, pauci-immune glomerulonephritis.   7. History of pneumonia with ARDS in 2005, MRSA PNA with prolonged hospitalization in [facility] in 11/11.  8. Jejunal diverticulum, s/p perf 07/2008  9. Possible obstructive sleep apnea. When the patient was sedated for TEE in May 2010, she did develop some upper airway obstruction. Apparently, however, she has had a negative sleep study.  10. Adenosine myoview (5/10): Normal LV EF and no scar or ischemia (normal stress).  11. Anemia of chronic disease  12. Hypothyroidism  13. Gout  14. Prolonged hospitalization at [facility] 12/12 - 2/13 with diverticulitis.    Current Outpatient Prescriptions  Medication Sig Dispense Refill  . acetaminophen (TYLENOL) 500 MG tablet Take 1,000 mg by mouth 2 (two) times daily as needed for pain.      Marland Kitchen albuterol (PROVENTIL) (2.5 MG/3ML) 0.083% nebulizer solution Take 3 mLs (2.5 mg total) by nebulization 2 (two) times daily. Dx: 492.8 COPD with Emphysema  270 mL  2  . bisoprolol (ZEBETA) 10 MG tablet Take  1 tablet (10 mg total) by mouth daily.  30 tablet  6  . diphenhydrAMINE (BENADRYL) 25 MG tablet Take 25 mg by mouth at bedtime as needed for itching.      Marland Kitchen guaifenesin (HUMIBID E) 400 MG TABS Take 400 mg by mouth 3 (three) times daily.      . hydrALAZINE (APRESOLINE) 50 MG tablet Take 1 tablet (50 mg total) by mouth every 8 (eight) hours.  90 tablet  5  . KLOR-CON M20 20 MEQ tablet TAKE ONE TABLET BY MOUTH EVERY DAY  30 tablet  5  . levothyroxine (SYNTHROID, LEVOTHROID) 50 MCG tablet Take 50 mcg by  mouth daily.      . Multiple Vitamin (MULITIVITAMIN WITH MINERALS) TABS Take 1 tablet by mouth daily.      . predniSONE (DELTASONE) 10 MG tablet TAKE ONE TABLET BY MOUTH ONCE DAILY  30 tablet  3  . ranitidine (ZANTAC) 150 MG capsule Take 150 mg by mouth every evening.        Marland Kitchen Respiratory Therapy Supplies (FLUTTER) DEVI by Does not apply route. Use as directed      . simvastatin (ZOCOR) 40 MG tablet Take 40 mg by mouth every evening.      . tiotropium (SPIRIVA HANDIHALER) 18 MCG inhalation capsule Place 1 capsule (18 mcg total) into inhaler and inhale daily.  30 capsule  11  . torsemide (DEMADEX) 20 MG tablet 4 tablets(total 80mg ) daily at the same time in the morning  240 tablet  6  . warfarin (COUMADIN) 2 MG tablet Take 1 tablet (2 mg total) by mouth as directed.  30 tablet  0  . warfarin (COUMADIN) 3 MG tablet Take 1 tablet (3 mg total) by mouth as directed.  30 tablet  3  . [DISCONTINUED] albuterol-ipratropium (COMBIVENT) 18-103 MCG/ACT inhaler Inhale 2 puffs into the lungs 2 (two) times daily.      . [DISCONTINUED] colchicine 0.6 MG tablet Take 0.6 mg by mouth daily.      . [DISCONTINUED] famotidine (PEPCID) 20 MG tablet Take 1 tablet (20 mg total) by mouth at bedtime.  30 tablet  1  . [DISCONTINUED] fluticasone (FLOVENT HFA) 110 MCG/ACT inhaler Inhale 2 puffs into the lungs 2 (two) times daily.       No current facility-administered medications for this visit.    BP 92/54  Pulse 76  Ht 5\' 2"  (1.575 m)  Wt 92.171 kg (203 lb 3.2 oz)  BMI 37.16 kg/m2  SpO2 90% General: NAD, obese.  Neck: Thick, JVP not elevated, no thyromegaly or thyroid nodule.  Lungs: Clear bilaterally  CV: Nondisplaced PMI.  Heart irregular S1/S2, no S3/S4, no murmur.  1+ ankle edema.  No carotid bruit.   Abdomen: Soft, nontender, no hepatosplenomegaly, no distention.   Neurologic: Alert and oriented x 3.  Psych: Normal affect. Extremities: No clubbing or cyanosis.   Assessment/Plan: 1. Diastolic CHF:  Heidi  Burch seems to be doing better.  Still NYHA class III but improved.  Weight is down, JVP not elevated.  Creatinine has been stable.  - Repeat BMET/BNP today.  - Continue current torsemide dose. - I encouraged her to walk or use a stationary bike for goal 10-15 min several times a week.  2. Atrial fibrillation: Now chronic.  Rate is controlled with bisoprolol. She is on warfarin.  She is not a NOAC candidate with significant CKD.  3. CKD: Pauci-immune glomerulonephritis.  She sees Dr. Eliott Burch.   4. COPD: On home oxygen.  Marca Ancona 12/06/2012

## 2012-12-19 ENCOUNTER — Encounter (HOSPITAL_COMMUNITY): Payer: Medicare Other

## 2012-12-31 ENCOUNTER — Encounter: Payer: Self-pay | Admitting: Critical Care Medicine

## 2012-12-31 ENCOUNTER — Ambulatory Visit (INDEPENDENT_AMBULATORY_CARE_PROVIDER_SITE_OTHER): Payer: Medicare Other | Admitting: *Deleted

## 2012-12-31 ENCOUNTER — Ambulatory Visit (INDEPENDENT_AMBULATORY_CARE_PROVIDER_SITE_OTHER): Payer: Medicare Other | Admitting: Critical Care Medicine

## 2012-12-31 VITALS — BP 130/72 | HR 78 | Temp 98.9°F | Ht 63.0 in | Wt 197.0 lb

## 2012-12-31 DIAGNOSIS — J439 Emphysema, unspecified: Secondary | ICD-10-CM

## 2012-12-31 DIAGNOSIS — J438 Other emphysema: Secondary | ICD-10-CM

## 2012-12-31 DIAGNOSIS — I4891 Unspecified atrial fibrillation: Secondary | ICD-10-CM

## 2012-12-31 LAB — POCT INR: INR: 1.5

## 2012-12-31 MED ORDER — WARFARIN SODIUM 2 MG PO TABS: 2.0000 mg | ORAL_TABLET | ORAL | Status: DC

## 2012-12-31 NOTE — Progress Notes (Signed)
Subjective:    Patient ID: [name], female    DOB: February 24, 1931, 79 y.o.   MRN: [id number]  HPI  12/31/2012 Chief Complaint  Patient presents with  . 4 month follow up    o2 sat 86% on RA upon arrival to exam room.  Reports breathing is doing "great" and believes it is doing better now than it has been in a while.  Reports no wheezing, chest tightness, chest pain, or cough at this time.  No wheeze and doing well.  Now at home, lives with son and daughter in law.  No edema. Keeps compression hose in place.  No mucus.  No chest pain .  On pred 10mg /d. Takes extra with gouty flares Pt denies any significant sore throat, nasal congestion or excess secretions, fever, chills, sweats, unintended weight loss, pleurtic or exertional chest pain, orthopnea PND, or leg swelling Pt denies any increase in rescue therapy over baseline, denies waking up needing it or having any early am or nocturnal exacerbations of coughing/wheezing/or dyspnea. Pt also denies any obvious fluctuation in symptoms with  weather or environmental change or other alleviating or aggravating factors      Past Medical History  Diagnosis Date  . Diabetes mellitus   . Obesity   . COPD (chronic obstructive pulmonary disease)     Moderate. PFTs (12/10): FVC 67%, FEV1 58%, ratio 60%, TLC 95%, RV 138%, DLCO 43% (moderate obstructive defect). She is on home oxygen that should be worn at all times.  . Diastolic CHF, chronic     Echo (3/12): EF 55-60%, mild LV hypertrophy, grade I diastolic dysfunction, mild left atrial enlargement, normal pulmonary artery pressure  . CKD (chronic kidney disease)     Cr 2.4 when last checked. 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 PNA (pneumonia)     h/o with ARDS in 2005; MRSA PNA with prolonged hospitalization in [facility] 11/11  . Diverticulum     Jejunal  . OSA (obstructive sleep apnea)     possible. When pt was sedated for TEE in May 2010, she did  develop some upper airway obstruction  . Anemia     of chronic disease  . Hypothyroidism   . Gout   . Atrial fibrillation 07/2008    Failed cardioversion with TEE guidance in May 2010. Pt went back to NSR by 11/2008 with amiodarne  . Hypertension   . GERD (gastroesophageal reflux disease)   . Peripheral vascular disease   . Pneumonia   . Chronic kidney disease   . Respiratory failure, chronic 07/01/2011  . Chronic gout due to renal impairment 07/01/2011     Family History  Problem Relation Age of Onset  . Emphysema Mother     and asthma  . Asthma Mother   . Heart disease Neg Hx     premature     History   Social History  . Marital Status: Married    Spouse Name: N/A    Number of Children: N/A  . Years of Education: N/A   Occupational History  . Retired     Previously worked as Production designer, theatre/television/film for [employer]   Social History Main Topics  . Smoking status: Former Smoker -- 1.00 packs/day for 23 years    Types: Cigarettes    Quit date: 04/11/1976  . Smokeless tobacco: Never Used     Comment: Started at age 31 (quit at age 26)  . Alcohol Use: No  . Drug Use: No  .  Sexual Activity: Not Currently   Other Topics Concern  . Not on file   Social History Narrative   Still living at home, married and has at least a daughter [name] [phone] and a son. Daughter is an NP. Pt emergency contact per [name] is Zenaida Tesar (sister) [phone]     Allergies  Allergen Reactions  . Sulfonamide Derivatives     Childhood reaction     Outpatient Prescriptions Prior to Visit  Medication Sig Dispense Refill  . acetaminophen (TYLENOL) 500 MG tablet Take 1,000 mg by mouth 2 (two) times daily as needed for pain.      Marland Kitchen albuterol (PROVENTIL) (2.5 MG/3ML) 0.083% nebulizer solution Take 3 mLs (2.5 mg total) by nebulization 2 (two) times daily. Dx: 492.8 COPD with Emphysema  270 mL  2  . bisoprolol (ZEBETA) 10 MG tablet Take 1 tablet (10 mg total) by mouth daily.  30 tablet  6  .  diphenhydrAMINE (BENADRYL) 25 MG tablet Take 25 mg by mouth at bedtime as needed for itching.      Marland Kitchen guaifenesin (HUMIBID E) 400 MG TABS Take 400 mg by mouth 3 (three) times daily.      . hydrALAZINE (APRESOLINE) 50 MG tablet Take 1 tablet (50 mg total) by mouth every 8 (eight) hours.  90 tablet  5  . KLOR-CON M20 20 MEQ tablet TAKE ONE TABLET BY MOUTH EVERY DAY  30 tablet  5  . levothyroxine (SYNTHROID, LEVOTHROID) 50 MCG tablet Take 50 mcg by mouth daily.      . Multiple Vitamin (MULITIVITAMIN WITH MINERALS) TABS Take 1 tablet by mouth daily.      . predniSONE (DELTASONE) 10 MG tablet TAKE ONE TABLET BY MOUTH ONCE DAILY  30 tablet  3  . ranitidine (ZANTAC) 150 MG capsule Take 150 mg by mouth every evening.        Marland Kitchen Respiratory Therapy Supplies (FLUTTER) DEVI by Does not apply route. Use as directed      . simvastatin (ZOCOR) 40 MG tablet Take 40 mg by mouth every evening.      . tiotropium (SPIRIVA HANDIHALER) 18 MCG inhalation capsule Place 1 capsule (18 mcg total) into inhaler and inhale daily.  30 capsule  11  . torsemide (DEMADEX) 20 MG tablet 4 tablets(total 80mg ) daily at the same time in the morning  240 tablet  6  . warfarin (COUMADIN) 3 MG tablet Take 1 tablet (3 mg total) by mouth as directed.  30 tablet  3   No facility-administered medications prior to visit.       Review of Systems  Constitutional:   No  weight loss, night sweats,  Fevers, chills, fatigue, lassitude. HEENT:   No headaches,  Difficulty swallowing,  Tooth/dental problems,  Sore throat,                No sneezing, itching, ear ache, nasal congestion, post nasal drip,   CV:  No chest pain,  Orthopnea, PND, no swelling in lower extremities,  No anasarca, dizziness, palpitations  GI  No heartburn, indigestion, abdominal pain, nausea, vomiting, diarrhea, change in bowel habits, loss of appetite  Resp: ++shortness of breath with exertion not  at rest.  No  excess mucus, no  productive cough,  Notes non-productive  cough,  No coughing up of blood.  No  change in color of mucus.  No wheezing.  No chest wall deformity  Skin: no rash or lesions.  GU: no dysuria, change in color of  urine, no urgency or frequency.  No flank pain.  MS:  No joint pain or swelling.  No decreased range of motion.  No back pain.  Psych:  No change in mood or affect. No depression or anxiety.  No memory loss.     Objective:   Physical Exam  Filed Vitals:   12/31/12 1503 12/31/12 1506  BP:  130/72  Pulse:  78  Temp:  98.9 F (37.2 C)  TempSrc:  Oral  Height:  5\' 3"  (1.6 m)  Weight:  197 lb (89.359 kg)  SpO2: 86% 91%    Gen: Pleasant, well-nourished, in no distress,  normal affect  ENT: No lesions,  mouth clear,  oropharynx clear, no postnasal drip  Neck: No JVD, no TMG, no carotid bruits  Lungs: No use of accessory muscles, no dullness to percussion, rhonchi right lower lobe, Improved airflow with decreased wheezes  Cardiovascular: RRR, heart sounds normal, no murmur or gallops, 1+  peripheral edema in LE  Abdomen: soft and NT, no HSM,  BS normal  Musculoskeletal: No deformities, no cyanosis or clubbing  Neuro: alert, non focal  Skin: Warm, no lesions or rashes          Assessment & Plan:   COPD with emphysema Gold stage C. COPD oxygen dependent with chronic respiratory failure Note patient has already receive flu vaccine this season Plan Maintain inhaled medications prescribed     Updated Medication List Outpatient Encounter Prescriptions as of 12/31/2012  Medication Sig Dispense Refill  . acetaminophen (TYLENOL) 500 MG tablet Take 1,000 mg by mouth 2 (two) times daily as needed for pain.      Marland Kitchen albuterol (PROVENTIL) (2.5 MG/3ML) 0.083% nebulizer solution Take 3 mLs (2.5 mg total) by nebulization 2 (two) times daily. Dx: 492.8 COPD with Emphysema  270 mL  2  . bisoprolol (ZEBETA) 10 MG tablet Take 1 tablet (10 mg total) by mouth daily.  30 tablet  6  . diphenhydrAMINE (BENADRYL) 25 MG  tablet Take 25 mg by mouth at bedtime as needed for itching.      Marland Kitchen guaifenesin (HUMIBID E) 400 MG TABS Take 400 mg by mouth 3 (three) times daily.      . hydrALAZINE (APRESOLINE) 50 MG tablet Take 1 tablet (50 mg total) by mouth every 8 (eight) hours.  90 tablet  5  . KLOR-CON M20 20 MEQ tablet TAKE ONE TABLET BY MOUTH EVERY DAY  30 tablet  5  . levothyroxine (SYNTHROID, LEVOTHROID) 50 MCG tablet Take 50 mcg by mouth daily.      . Multiple Vitamin (MULITIVITAMIN WITH MINERALS) TABS Take 1 tablet by mouth daily.      . predniSONE (DELTASONE) 10 MG tablet TAKE ONE TABLET BY MOUTH ONCE DAILY  30 tablet  3  . ranitidine (ZANTAC) 150 MG capsule Take 150 mg by mouth every evening.        Marland Kitchen Respiratory Therapy Supplies (FLUTTER) DEVI by Does not apply route. Use as directed      . simvastatin (ZOCOR) 40 MG tablet Take 40 mg by mouth every evening.      . tiotropium (SPIRIVA HANDIHALER) 18 MCG inhalation capsule Place 1 capsule (18 mcg total) into inhaler and inhale daily.  30 capsule  11  . torsemide (DEMADEX) 20 MG tablet 4 tablets(total 80mg ) daily at the same time in the morning  240 tablet  6  . warfarin (COUMADIN) 2 MG tablet Take 1 tablet (2 mg total) by mouth as directed.  30  tablet  1  . warfarin (COUMADIN) 3 MG tablet Take 1 tablet (3 mg total) by mouth as directed.  30 tablet  3   No facility-administered encounter medications on file as of 12/31/2012.

## 2012-12-31 NOTE — Patient Instructions (Addendum)
No change in medications Return 4 months 

## 2012-12-31 NOTE — Assessment & Plan Note (Signed)
Gold stage C. COPD oxygen dependent with chronic respiratory failure Note patient has already receive flu vaccine this season Plan Maintain inhaled medications prescribed

## 2013-01-16 ENCOUNTER — Ambulatory Visit (INDEPENDENT_AMBULATORY_CARE_PROVIDER_SITE_OTHER): Payer: Medicare Other | Admitting: *Deleted

## 2013-01-16 DIAGNOSIS — I4891 Unspecified atrial fibrillation: Secondary | ICD-10-CM

## 2013-01-16 LAB — POCT INR: INR: 2.9

## 2013-01-30 ENCOUNTER — Telehealth: Payer: Self-pay | Admitting: Critical Care Medicine

## 2013-01-30 MED ORDER — PREDNISONE 10 MG PO TABS: ORAL_TABLET | ORAL | Status: DC

## 2013-01-30 NOTE — Telephone Encounter (Signed)
I spoke with [facility]. Was advised they never received RX sent to them 12/04/12. I gave VO for pt prednisone and they will get this ready for pt.   lmtcb x1 for pt son

## 2013-02-01 NOTE — Telephone Encounter (Signed)
Called pharmacy and pt picked up rx. Carron Curie, CMA

## 2013-02-05 ENCOUNTER — Ambulatory Visit (INDEPENDENT_AMBULATORY_CARE_PROVIDER_SITE_OTHER): Payer: Medicare Other | Admitting: *Deleted

## 2013-02-05 DIAGNOSIS — I4891 Unspecified atrial fibrillation: Secondary | ICD-10-CM

## 2013-02-08 ENCOUNTER — Other Ambulatory Visit: Payer: Self-pay | Admitting: Cardiology

## 2013-02-19 ENCOUNTER — Ambulatory Visit (INDEPENDENT_AMBULATORY_CARE_PROVIDER_SITE_OTHER): Payer: Medicare Other | Admitting: *Deleted

## 2013-02-19 DIAGNOSIS — I4891 Unspecified atrial fibrillation: Secondary | ICD-10-CM

## 2013-02-19 LAB — POCT INR: INR: 2.7

## 2013-03-20 ENCOUNTER — Encounter (HOSPITAL_COMMUNITY): Payer: Self-pay | Admitting: Internal Medicine

## 2013-03-20 ENCOUNTER — Inpatient Hospital Stay (HOSPITAL_COMMUNITY)
Admission: EM | Admit: 2013-03-20 | Discharge: 2013-03-24 | DRG: 392 | Disposition: A | Discharge status: Home / Self Care | Payer: Medicare Other | Attending: Internal Medicine | Admitting: Internal Medicine

## 2013-03-20 ENCOUNTER — Emergency Department (HOSPITAL_COMMUNITY): Payer: Medicare Other

## 2013-03-20 DIAGNOSIS — E782 Mixed hyperlipidemia: Secondary | ICD-10-CM | POA: Diagnosis present

## 2013-03-20 DIAGNOSIS — M109 Gout, unspecified: Secondary | ICD-10-CM | POA: Diagnosis present

## 2013-03-20 DIAGNOSIS — D638 Anemia in other chronic diseases classified elsewhere: Secondary | ICD-10-CM | POA: Diagnosis present

## 2013-03-20 DIAGNOSIS — K219 Gastro-esophageal reflux disease without esophagitis: Secondary | ICD-10-CM | POA: Diagnosis present

## 2013-03-20 DIAGNOSIS — I5032 Chronic diastolic (congestive) heart failure: Secondary | ICD-10-CM | POA: Diagnosis present

## 2013-03-20 DIAGNOSIS — I129 Hypertensive chronic kidney disease with stage 1 through stage 4 chronic kidney disease, or unspecified chronic kidney disease: Secondary | ICD-10-CM | POA: Diagnosis present

## 2013-03-20 DIAGNOSIS — Z6834 Body mass index (BMI) 34.0-34.9, adult: Secondary | ICD-10-CM

## 2013-03-20 DIAGNOSIS — J4489 Other specified chronic obstructive pulmonary disease: Secondary | ICD-10-CM | POA: Diagnosis present

## 2013-03-20 DIAGNOSIS — N184 Chronic kidney disease, stage 4 (severe): Secondary | ICD-10-CM | POA: Diagnosis present

## 2013-03-20 DIAGNOSIS — I4891 Unspecified atrial fibrillation: Secondary | ICD-10-CM | POA: Diagnosis present

## 2013-03-20 DIAGNOSIS — Z9981 Dependence on supplemental oxygen: Secondary | ICD-10-CM

## 2013-03-20 DIAGNOSIS — R791 Abnormal coagulation profile: Secondary | ICD-10-CM | POA: Diagnosis present

## 2013-03-20 DIAGNOSIS — J9610 Chronic respiratory failure, unspecified whether with hypoxia or hypercapnia: Secondary | ICD-10-CM | POA: Diagnosis present

## 2013-03-20 DIAGNOSIS — M1990 Unspecified osteoarthritis, unspecified site: Secondary | ICD-10-CM | POA: Diagnosis present

## 2013-03-20 DIAGNOSIS — R509 Fever, unspecified: Secondary | ICD-10-CM | POA: Diagnosis present

## 2013-03-20 DIAGNOSIS — J189 Pneumonia, unspecified organism: Secondary | ICD-10-CM

## 2013-03-20 DIAGNOSIS — G4733 Obstructive sleep apnea (adult) (pediatric): Secondary | ICD-10-CM | POA: Diagnosis present

## 2013-03-20 DIAGNOSIS — I739 Peripheral vascular disease, unspecified: Secondary | ICD-10-CM | POA: Diagnosis present

## 2013-03-20 DIAGNOSIS — R531 Weakness: Secondary | ICD-10-CM

## 2013-03-20 DIAGNOSIS — J439 Emphysema, unspecified: Secondary | ICD-10-CM | POA: Diagnosis present

## 2013-03-20 DIAGNOSIS — K5732 Diverticulitis of large intestine without perforation or abscess without bleeding: Principal | ICD-10-CM | POA: Diagnosis present

## 2013-03-20 DIAGNOSIS — E119 Type 2 diabetes mellitus without complications: Secondary | ICD-10-CM | POA: Diagnosis present

## 2013-03-20 DIAGNOSIS — Z8614 Personal history of Methicillin resistant Staphylococcus aureus infection: Secondary | ICD-10-CM

## 2013-03-20 DIAGNOSIS — E039 Hypothyroidism, unspecified: Secondary | ICD-10-CM | POA: Diagnosis present

## 2013-03-20 DIAGNOSIS — I10 Essential (primary) hypertension: Secondary | ICD-10-CM | POA: Diagnosis present

## 2013-03-20 DIAGNOSIS — K5720 Diverticulitis of large intestine with perforation and abscess without bleeding: Secondary | ICD-10-CM | POA: Diagnosis present

## 2013-03-20 DIAGNOSIS — J449 Chronic obstructive pulmonary disease, unspecified: Secondary | ICD-10-CM | POA: Diagnosis present

## 2013-03-20 DIAGNOSIS — R748 Abnormal levels of other serum enzymes: Secondary | ICD-10-CM

## 2013-03-20 DIAGNOSIS — Z7901 Long term (current) use of anticoagulants: Secondary | ICD-10-CM

## 2013-03-20 DIAGNOSIS — I509 Heart failure, unspecified: Secondary | ICD-10-CM | POA: Diagnosis present

## 2013-03-20 DIAGNOSIS — E669 Obesity, unspecified: Secondary | ICD-10-CM | POA: Diagnosis present

## 2013-03-20 DIAGNOSIS — Z87891 Personal history of nicotine dependence: Secondary | ICD-10-CM

## 2013-03-20 LAB — COMPREHENSIVE METABOLIC PANEL
ALT: 643 U/L — ABNORMAL HIGH (ref 0–35)
AST: 828 U/L — ABNORMAL HIGH (ref 0–37)
Albumin: 3.5 g/dL (ref 3.5–5.2)
Alkaline Phosphatase: 803 U/L — ABNORMAL HIGH (ref 39–117)
BUN: 83 mg/dL — ABNORMAL HIGH (ref 6–23)
CO2: 27 mEq/L (ref 19–32)
Calcium: 10.1 mg/dL (ref 8.4–10.5)
Chloride: 93 mEq/L — ABNORMAL LOW (ref 96–112)
Creatinine, Ser: 2.15 mg/dL — ABNORMAL HIGH (ref 0.50–1.10)
GFR calc Af Amer: 24 mL/min — ABNORMAL LOW (ref >90)
GFR calc non Af Amer: 21 mL/min — ABNORMAL LOW (ref >90)
Glucose, Bld: 202 mg/dL — ABNORMAL HIGH (ref 70–99)
Potassium: 4.1 mEq/L (ref 3.5–5.1)
Sodium: 137 mEq/L (ref 135–145)
Total Bilirubin: 0.9 mg/dL (ref 0.3–1.2)
Total Protein: 7.3 g/dL (ref 6.0–8.3)

## 2013-03-20 LAB — URINALYSIS, ROUTINE W REFLEX MICROSCOPIC
Bilirubin Urine: NEGATIVE
Glucose, UA: NEGATIVE mg/dL
Ketones, ur: NEGATIVE mg/dL
Nitrite: NEGATIVE
Protein, ur: NEGATIVE mg/dL
Specific Gravity, Urine: 1.011 (ref 1.005–1.030)
Urobilinogen, UA: 0.2 mg/dL (ref 0.0–1.0)
pH: 5 (ref 5.0–8.0)

## 2013-03-20 LAB — URINE MICROSCOPIC-ADD ON

## 2013-03-20 LAB — CBC WITH DIFFERENTIAL/PLATELET
Basophils Absolute: 0 10*3/uL (ref 0.0–0.1)
Basophils Relative: 0 % (ref 0–1)
Eosinophils Relative: 0 % (ref 0–5)
Lymphocytes Relative: 5 % — ABNORMAL LOW (ref 12–46)
MCHC: 30.8 g/dL (ref 30.0–36.0)
Monocytes Absolute: 0.5 10*3/uL (ref 0.1–1.0)
Neutro Abs: 10.2 10*3/uL — ABNORMAL HIGH (ref 1.7–7.7)
Platelets: 205 10*3/uL (ref 150–400)
RDW: 16.5 % — ABNORMAL HIGH (ref 11.5–15.5)
WBC: 11.3 10*3/uL — ABNORMAL HIGH (ref 4.0–10.5)

## 2013-03-20 LAB — PROTIME-INR
INR: 4.4 — ABNORMAL HIGH (ref 0.00–1.49)
Prothrombin Time: 40.3 seconds — ABNORMAL HIGH (ref 11.6–15.2)

## 2013-03-20 LAB — LIPASE, BLOOD: Lipase: 23 U/L (ref 11–59)

## 2013-03-20 MED ORDER — DEXTROSE 5 % IV SOLN: 500.0000 mg | Freq: Once | INTRAVENOUS | Status: DC

## 2013-03-20 MED ORDER — POTASSIUM CHLORIDE CRYS ER 20 MEQ PO TBCR: 20.0000 meq | EXTENDED_RELEASE_TABLET | Freq: Every day | ORAL | Status: DC

## 2013-03-20 MED ORDER — TORSEMIDE 20 MG PO TABS: 80.0000 mg | ORAL_TABLET | Freq: Every day | ORAL | Status: DC

## 2013-03-20 MED ORDER — PREDNISONE 10 MG PO TABS
10.0000 mg | ORAL_TABLET | Freq: Every day | ORAL | Status: DC
  Administered 2013-03-21: 10 mg via ORAL
  Filled 2013-03-20 (×2): qty 1

## 2013-03-20 MED ORDER — DEXTROSE 5 % IV SOLN
1.0000 g | Freq: Once | INTRAVENOUS | Status: AC
  Administered 2013-03-20: 1 g via INTRAVENOUS
  Filled 2013-03-20: qty 10

## 2013-03-20 MED ORDER — ONDANSETRON HCL 4 MG/2ML IJ SOLN
4.0000 mg | Freq: Once | INTRAMUSCULAR | Status: AC
  Administered 2013-03-20: 4 mg via INTRAVENOUS
  Filled 2013-03-20: qty 2

## 2013-03-20 MED ORDER — TIOTROPIUM BROMIDE MONOHYDRATE 18 MCG IN CAPS
18.0000 ug | ORAL_CAPSULE | Freq: Every day | RESPIRATORY_TRACT | Status: DC
  Administered 2013-03-21 – 2013-03-24 (×4): 18 ug via RESPIRATORY_TRACT
  Filled 2013-03-20: qty 5

## 2013-03-20 MED ORDER — BISOPROLOL FUMARATE 5 MG PO TABS
5.0000 mg | ORAL_TABLET | Freq: Every day | ORAL | Status: DC
  Administered 2013-03-20 – 2013-03-24 (×5): 5 mg via ORAL
  Filled 2013-03-20 (×5): qty 1

## 2013-03-20 MED ORDER — ALBUTEROL SULFATE (5 MG/ML) 0.5% IN NEBU: 2.5000 mg | INHALATION_SOLUTION | RESPIRATORY_TRACT | Status: DC | PRN

## 2013-03-20 MED ORDER — DEXTROSE 5 % IV SOLN
500.0000 mg | Freq: Once | INTRAVENOUS | Status: AC
  Administered 2013-03-20: 500 mg via INTRAVENOUS

## 2013-03-20 MED ORDER — LEVOTHYROXINE SODIUM 50 MCG PO TABS
50.0000 ug | ORAL_TABLET | Freq: Every day | ORAL | Status: DC
  Administered 2013-03-21 – 2013-03-24 (×4): 50 ug via ORAL
  Filled 2013-03-20 (×5): qty 1

## 2013-03-20 MED ORDER — SODIUM CHLORIDE 0.9 % IV BOLUS (SEPSIS)
1000.0000 mL | Freq: Once | INTRAVENOUS | Status: AC
  Administered 2013-03-20: 1000 mL via INTRAVENOUS

## 2013-03-20 MED ORDER — IOHEXOL 300 MG/ML  SOLN: 25.0000 mL | INTRAMUSCULAR | Status: AC

## 2013-03-20 MED ORDER — WARFARIN - PHARMACIST DOSING INPATIENT: Freq: Every day | Status: DC

## 2013-03-20 MED ORDER — METRONIDAZOLE IN NACL 5-0.79 MG/ML-% IV SOLN
500.0000 mg | Freq: Three times a day (TID) | INTRAVENOUS | Status: DC
  Administered 2013-03-20 – 2013-03-22 (×5): 500 mg via INTRAVENOUS
  Filled 2013-03-20 (×8): qty 100

## 2013-03-20 MED ORDER — VITAMIN K1 10 MG/ML IJ SOLN
5.0000 mg | Freq: Once | INTRAVENOUS | Status: AC
  Administered 2013-03-20: 21:00:00 5 mg via INTRAVENOUS
  Filled 2013-03-20: qty 0.5

## 2013-03-20 MED ORDER — SODIUM CHLORIDE 0.9 % IJ SOLN
3.0000 mL | Freq: Two times a day (BID) | INTRAMUSCULAR | Status: DC
  Administered 2013-03-20 – 2013-03-24 (×8): 3 mL via INTRAVENOUS

## 2013-03-20 MED ORDER — TORSEMIDE 20 MG PO TABS
80.0000 mg | ORAL_TABLET | Freq: Every day | ORAL | Status: DC
  Administered 2013-03-21: 80 mg via ORAL
  Filled 2013-03-20 (×2): qty 4

## 2013-03-20 NOTE — ED Notes (Signed)
Pt presents to department stating she is "sick." states she doesn't feel good, has been very weak. Also states nausea and fever. Pt is poor historian, but able to answer simple questions at the time. Denies pain.

## 2013-03-20 NOTE — H&P (Signed)
Date: 03/20/2013               Patient Name:  [name] MRN: [id number]  DOB: 10/02/1930 Age / Sex: 79 y.o., female   PCP: Nelly Scriven Not In System              Medical Service: Internal Medicine Teaching Service              Attending Physician: Dr. Burns Spain, MD    First Contact: Bernadette Hoit, MS III Pager: [phone]  Second Contact: Dr. Boykin Peek Pager: [phone]  Third Contact Dr. Dow Adolph Pager: [phone]       After Hours (After 5p/  First Contact Pager: [phone]  weekends / holidays): Second Contact Pager: [phone]   Chief Complaint: fever, chills, weakness, vomiting x1  History of Present Illness: [name] is a 79 year old woman with PMH of GERD, dCHF, CKD, hyperlipidemia, COPD, a-fib, PVD, hypothyroidism, diverticulosis, and multiple episodes of C diff who presents with a one-day history of fever, chills, weakness, and vomiting x1. She woke up this morning feeling cold and weak, and vomited once after having a few sips of water. Her daughter checked her temperature and found it was 101.4. She usually gets around with a walker, but she felt too weak to use it so her daughter had to help her into her wheelchair to come to the ED.  The patient has a history of multiple episodes of diverticulitis, with the last being in mid-November. She was prescribed a course of Augmentin, which improved her symptoms, she finished just beforeThanksgiving. Towards the end of her course she started having loose stools, and her daughter who is a nurse thought she had developed C diff, so she prescribed her Cipro and Flagyl. They held off starting these antibiotics, however, so she could have a few drinks on Thanksgiving. She developed LLQ pain and had 5-6 loose stools on 03/13/13, however, so she started the Cipro and Flagyl.  The patient felt some heartburn after dinner, so she took a Gaviscon which helped. She ate chicken breast and green beans last night, which  is typical food that she normally handles well. She is now having normal bowel movements, with the last one being yesterday. She denies nausea, blood in her vomit, sick contacts, travel, abdominal trauma, or recent falls. She has no history of abdominal surgery.  Meds: Current Facility-Administered Medications  Medication Dose Route Frequency Leeam Cedrone Last Rate Last Dose  . albuterol (PROVENTIL) (5 MG/ML) 0.5% nebulizer solution 2.5 mg  2.5 mg Nebulization Q4H PRN Neema Davina Poke, MD      . bisoprolol (ZEBETA) tablet 5 mg  5 mg Oral Daily Belia Heman, MD   5 mg at 03/20/13 2112  . [START ON 03/21/2013] levothyroxine (SYNTHROID, LEVOTHROID) tablet 50 mcg  50 mcg Oral QAC breakfast Neema Davina Poke, MD      . metroNIDAZOLE (FLAGYL) IVPB 500 mg  500 mg Intravenous Q8H Belia Heman, MD   500 mg at 03/20/13 2205  . [START ON 03/21/2013] predniSONE (DELTASONE) tablet 10 mg  10 mg Oral Q breakfast Neema K Sharda, MD      . sodium chloride 0.9 % injection 3 mL  3 mL Intravenous Q12H Belia Heman, MD   3 mL at 03/20/13 2119  . [START ON 03/21/2013] tiotropium (SPIRIVA) inhalation capsule 18 mcg  18 mcg Inhalation Daily Belia Heman, MD      . Melene Muller ON 03/21/2013] torsemide (DEMADEX)  tablet 80 mg  80 mg Oral Daily Neema Davina Poke, MD       Medications Prior to Admission  Medication Sig Dispense Refill  . acetaminophen (TYLENOL) 500 MG tablet Take 1,000 mg by mouth 2 (two) times daily as needed for pain.      Marland Kitchen albuterol (PROVENTIL) (2.5 MG/3ML) 0.083% nebulizer solution Take 3 mLs (2.5 mg total) by nebulization 2 (two) times daily. Dx: 492.8 COPD with Emphysema  270 mL  2  . bisoprolol (ZEBETA) 10 MG tablet Take 5 mg by mouth daily.      . ciprofloxacin (CIPRO) 500 MG tablet Take 500 mg by mouth 2 (two) times daily. For 10 days. Started 03/13/13      . diphenhydrAMINE (BENADRYL) 25 MG tablet Take 25 mg by mouth at bedtime as needed for itching.      Marland Kitchen guaifenesin (HUMIBID E) 400 MG TABS Take 400 mg  by mouth 2 (two) times daily.       Marland Kitchen levothyroxine (SYNTHROID, LEVOTHROID) 50 MCG tablet Take 50 mcg by mouth daily.      . metroNIDAZOLE (FLAGYL) 500 MG tablet Take 500 mg by mouth 2 (two) times daily. For 10 days. Started 03/13/13      . Multiple Vitamin (MULITIVITAMIN WITH MINERALS) TABS Take 1 tablet by mouth daily.      . potassium chloride SA (K-DUR,KLOR-CON) 20 MEQ tablet Take 20 mEq by mouth daily.      . predniSONE (DELTASONE) 10 MG tablet Take 10 mg by mouth daily with breakfast.      . ranitidine (ZANTAC) 150 MG capsule Take 150 mg by mouth every evening.        . simvastatin (ZOCOR) 40 MG tablet Take 40 mg by mouth every evening.      . tiotropium (SPIRIVA HANDIHALER) 18 MCG inhalation capsule Place 1 capsule (18 mcg total) into inhaler and inhale daily.  30 capsule  11  . torsemide (DEMADEX) 20 MG tablet Take 80 mg by mouth daily.      Marland Kitchen warfarin (COUMADIN) 2 MG tablet Take 2 mg by mouth every Monday, Wednesday, and Friday.      . warfarin (COUMADIN) 3 MG tablet Take 3 mg by mouth every Tuesday, Thursday, Saturday, and Sunday.        Allergies: Allergies as of 03/20/2013 - Review Complete 03/20/2013  Allergen Reaction Noted  . Sulfonamide derivatives     Past Medical History  Diagnosis Date  . Diabetes mellitus   . Obesity   . COPD (chronic obstructive pulmonary disease)     Moderate. PFTs (12/10): FVC 67%, FEV1 58%, ratio 60%, TLC 95%, RV 138%, DLCO 43% (moderate obstructive defect). She is on home oxygen that should be worn at all times.  . Diastolic CHF, chronic     Echo (3/12): EF 55-60%, mild LV hypertrophy, grade I diastolic dysfunction, mild left atrial enlargement, normal pulmonary artery pressure  . CKD (chronic kidney disease)     Cr 2.4 when last checked. 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 PNA (pneumonia)     h/o with ARDS in 2005; MRSA PNA with prolonged hospitalization in [facility] 11/11  . Diverticulum     Jejunal  .  OSA (obstructive sleep apnea)     possible. When pt was sedated for TEE in May 2010, she did develop some upper airway obstruction  . Anemia     of chronic disease  . Hypothyroidism   . Gout   .  Atrial fibrillation 07/2008    Failed cardioversion with TEE guidance in May 2010. Pt went back to NSR by 11/2008 with amiodarne  . Hypertension   . GERD (gastroesophageal reflux disease)   . Peripheral vascular disease   . Pneumonia   . Chronic kidney disease   . Respiratory failure, chronic 07/01/2011  . Chronic gout due to renal impairment 07/01/2011   Past Surgical History  Procedure Laterality Date  . Tracheostomy      ARDS/ ICU admission, trach 2005  . Microperforation  08/2008    Of jejunem, ICU admission   Family History  Problem Relation Age of Onset  . Emphysema Mother     and asthma  . Asthma Mother   . Heart disease Neg Hx     premature   Social History -Lives with daughter and son-in law in [place] -Gets up and moves around the house everyday, likes to take trips to the store and outside (not primarily bedbound) -Tobacco: former smoker, quit in '77. Smoked 1 ppd for 20 yrs -Alcohol: 1 glass of wine a week -Illicit drugs: none  Review of Systems: Constitutional: per HPI HEENT: no change in vision, sore throat, dysphagia CV: no chest pain or palpitations Pulm: no cough or SOB GI: per HPI GU: no dysuria or polyuria  Physical Exam: Blood pressure 119/57, pulse 104, temperature 98.9 F (37.2 C), temperature source Oral, resp. rate 20, height 5\' 3"  (1.6 m), weight 89.948 kg (198 lb 4.8 oz), SpO2 97.00%. General: obese woman lying in ED bed in NAD HEENT: PERRL, EOMI, no scleral icterus, moist mucous membranes, no oropharyngeal exudates or erythema CV: irregular rhythm, normal rate, no murmurs auscultated, 2+ radial and dorsalis pedis pulses Pulm: minimal wheezes in lung bases Abd: protuberant (but not distended per patient and her daughter), tender to deep palpation  in RUQ and RLQ, rebound tenderness to deep palpation in LLQ, no fluid shift or evidence of ascites, negative Murphy's sign, positive bowel sounds, no CVA tenderness Ext: 1+ pitting lower extremity edema bilaterally; dry, shiny, erythematous lower extremities tender to palpation Neuro: CNs II-XII grossly intact Psych: knew where she was, knew the date   Physical Exam  Lab results: CBC    Component Value Date/Time   WBC 11.3* 03/20/2013 1110   RBC 3.94 03/20/2013 1110   HGB 12.0 03/20/2013 1110   HCT 38.9 03/20/2013 1110   PLT 205 03/20/2013 1110   MCV 98.7 03/20/2013 1110   MCH 30.5 03/20/2013 1110   MCHC 30.8 03/20/2013 1110   RDW 16.5* 03/20/2013 1110   LYMPHSABS 0.6* 03/20/2013 1110   MONOABS 0.5 03/20/2013 1110   EOSABS 0.0 03/20/2013 1110   BASOSABS 0.0 03/20/2013 1110   CMP     Component Value Date/Time   NA 137 03/20/2013 1110   K 4.1 03/20/2013 1110   CL 93* 03/20/2013 1110   CO2 27 03/20/2013 1110   GLUCOSE 202* 03/20/2013 1110   BUN 83* 03/20/2013 1110   CREATININE 2.15* 03/20/2013 1110   CREATININE 1.90* 03/23/2012 1621   CALCIUM 10.1 03/20/2013 1110   PROT 7.3 03/20/2013 1110   ALBUMIN 3.5 03/20/2013 1110   AST 828* 03/20/2013 1110   ALT 643* 03/20/2013 1110   ALKPHOS 803* 03/20/2013 1110   BILITOT 0.9 03/20/2013 1110   GFRNONAA 21* 03/20/2013 1110   GFRAA 24* 03/20/2013 1110   Lipase     Component Value Date/Time   LIPASE 23 03/20/2013 1323   Venous lactic acid: 2.92 PT: 40.3 INR: 4.4  Imaging results:  Ct Abdomen Pelvis Wo Contrast  03/20/2013   CLINICAL DATA:  Nausea.  Fever.  Fatigue.  Abdominal pain.  EXAM: CT ABDOMEN AND PELVIS WITHOUT CONTRAST  TECHNIQUE: Multidetector CT imaging of the abdomen and pelvis was performed following the standard protocol without intravenous contrast.  COMPARISON:  04/18/2011  FINDINGS: Prominent epicardial adipose tissue. Mild atelectasis in the left lower lobe. Prior bilateral pleural effusions have resolved.  There is cardiomegaly along with calcification of the aortic and mitral valves, and coronary artery atherosclerotic calcification.  Prominent lateral segment left hepatic lobe. Small hypodensity in segment 3 along the falciform ligament likely represents focal fatty infiltration. Spleen and adrenal glands unremarkable in non-contrast CT appearance. Pancreas unremarkable.  Dependent abnormal density in the gallbladder may represent layering gallstones or sludge. Gallbladder moderately distended at 6 cm in diameter.  Stable 1.1 cm exophytic lesion of the posterior right mid kidney. Bilateral renal atrophy. Vascular calcifications along the left renal hilum.  No hydronephrosis or ureteral calculus. Several small bladder cellules are present.  Appendix normal. Scattered air-fluid levels are in the nondilated proximal loops of small bowel.  Aortoiliac atherosclerotic calcification noted do the  There is a 6.6 by 3.6 by 4.0 cm abscess along the posterior margin of the sigmoid colon shown on images 63-72 of series 2. This is adjacent to the left adnexa and contains fluid, gas, and stool like material. It may be continuous with the colon. This abscess extends back towards the left pelvic sidewall and obturator internus muscle, and is adjacent to the left external iliac vasculature. This likely represents a diverticular abscess.  Solid interbody fusion noted at L4-5. Severe loss of disc height at L3-4 with posterior osseous ridging and facet arthropathy. No lumbar spine fracture or acute subluxation.  IMPRESSION: 1. Sigmoid diverticular abscess extends posterolaterally from the sigmoid colon on the left towards the left pelvic sidewall, and contains gas, fluid, and stool like material. Uncertain whether this is connected/draining into the sigmoid colon. The extraluminal gas appears walled off and is not free within the peritoneal space. 2. Colonic diverticulosis. 3. Sludge versus cholelithiasis in the borderline distended  gallbladder. 4. Atherosclerosis. 5. Prominent lateral segment left hepatic lobe, which can be an early sign of cirrhotic liver disease. 6. Bilateral renal atrophy.   Electronically Signed   By: Herbie Baltimore M.D.   On: 03/20/2013 15:57   Dg Chest 2 View  03/20/2013   CLINICAL DATA:  Nausea and fatigue.  Fever.  EXAM: CHEST  2 VIEW  COMPARISON:  Two-view chest 04/20/2012.  FINDINGS: The heart is enlarged. Patient is rotated on the lateral view. A left pleural effusion is suspected. Left basilar airspace disease is present as well.  IMPRESSION: 1. Asymmetric left pleural effusion and airspace disease is concerning for pneumonia. 2. Stable cardiomegaly without failure.   Electronically Signed   By: Gennette Pac M.D.   On: 03/20/2013 12:23    Other results: EKG: a-fib w/ borderline RVR (101 bpm), normal axis, no evidence of hypertrophy, normal QRS duration, QRS has low voltage  Assessment & Plan by Problem: Ms. Courtnei Ruddell is a 79 year old woman with PMH of GERD, dCHF, CKD, hyperlipidemia, COPD, a-fib, PVD, hypothyroidism, diverticulosis, and multiple episodes of C diff who presents with a one-day history of fever, chills, weakness, and vomiting x1.  Fever, chills, weakness, vomiting x1 Symptom constellation most likely due to sigmoid diverticular abscess seen on abdominal CT. If there is communication into the sigmoid colon, can cause periodic fever spikes,  nausea, vomiting, diarrhea, etc. Patient is on day 7 of a 10-day course of  Cipro and Flagyl, which may have been treating the abscess. Also, if she has/had C diff, this should be treating it, unless is refractory to Flagyl. ED started her on IV Rocephin and azithromycin.. Met 3/4 SIRS criteria in ED (Tmax 102.8, RR >20, HR >100), and has elevated lactic acid, so bacteremia should be investigated. -Surgery consult for abscess drainage -Continue IV Rocephin and azithromycin -D/c oral Cipro and Flagyl, start IV metronidazole 500 mg q8  hrs -Blood cultures x2 -C diff PCR if diarrhea recurs or symptoms don't resolve after abscess drainage  Elevated liver enzymes AST 828, ALT 643, alk phos 803, total bili 0.9. Possibly due to NASH (fatty infiltrate seen on abdominal CT). Possibly due to cholelithiasis. Abdominal CT showed mildly distended gallbladder with evidence of sludge vs cholelithiasis. Cholelithiasis seen on previous abdominal CTs (Jan 2013 and Dec 2012). Viral hepatitis is a possibility, but liver enzymes were normal in Feb 2013 making Hep B/C unlikely, and the patient has no history of travel (Hep A/E). Congestive hepatopathy is possible but no signs of CHF exacerbation (weight is stable, no evidence on CXR). Hepatic venous outflow obstruction not seen on abdominal CT. -Follow on CMP, if not improving tomorrow will do abdominal US to evaluate for gallstones -Hold Tylenol and simvastatin   Left pleural effusion and concern for left lower lobe pneumonia on CXR Patient only has mild bilateral wheezes, no crackles. Not subjectively SOB. CXR similar to previous from Jan 2010 when didn't have pneumonia, although diaphragm border slightly more obscured. -Repeat CXR in morning -IV azithromycin and Rocephin  A-fib with borderline RVR Chronic a-fib, has failed cardioversion w/ TEE guidance in May 2010. The patient is on long-term warfarin. Supratherapeutic on admission with INR of 4.4, was in therapeutic range of 2.7 on 02/19/13. -Admit to telemetry -Pharmacy consult to determine warfarin dosage -Daily PT/INR check  COPD GOLD stage C on 2 liters O2 at home. Can go off of O2 while going out if she takes it easy, but can't go for extended periods without it. Has received flu vaccine this year.  -Continue home tiotropium 18 mcg inhaler daily -Continue home albuterol 2.5 mg neb q4 hrs PRN -2 L O2 nasal canula PRN  CKD Baseline creatinine of 1.8-2.1 since March 2013. Creatinine of 2.15 on admission. 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 -Continue prednisone 10 mg daily -Follow renal function on BMET  Diastolic CHF -Continue home bisoprolol 5 mg daily -Torsemide 80 mg daily (holding today b/c not eaten anything)  Hypothyroidism -Continue home levothyroxine 50 mcg daily  F/E/N -Regular diet   Signed: Boykin Peek, MD 03/20/2013, 10:35 PM

## 2013-03-20 NOTE — ED Provider Notes (Signed)
CSN: [id number]     Arrival date & time 03/20/13  1038 History   First MD Initiated Contact with Patient 03/20/13 1052     Chief Complaint  Patient presents with  . Nausea  . Fatigue  . Fever   (Consider location/radiation/quality/duration/timing/severity/associated sxs/prior Treatment) HPI Patient presents emergency department with generalized weakness and not feeling well since this morning history is obtained from the patient and her daughter.  Patient, states, that she's having some nausea, but no vomiting.  Patient, states, that she's having some left-sided abdominal pain, as well.  The patient denies chest pain, shortness of breath, dizziness, syncope, back pain, or headache the patient has also had several episodes of vomiting.  Patient, states, that these make her condition, better or worse. Past Medical History  Diagnosis Date  . Diabetes mellitus   . Obesity   . COPD (chronic obstructive pulmonary disease)     Moderate. PFTs (12/10): FVC 67%, FEV1 58%, ratio 60%, TLC 95%, RV 138%, DLCO 43% (moderate obstructive defect). She is on home oxygen that should be worn at all times.  . Diastolic CHF, chronic     Echo (3/12): EF 55-60%, mild LV hypertrophy, grade I diastolic dysfunction, mild left atrial enlargement, normal pulmonary artery pressure  . CKD (chronic kidney disease)     Cr 2.4 when last checked. 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 PNA (pneumonia)     h/o with ARDS in 2005; MRSA PNA with prolonged hospitalization in [facility] 11/11  . Diverticulum     Jejunal  . OSA (obstructive sleep apnea)     possible. When pt was sedated for TEE in May 2010, she did develop some upper airway obstruction  . Anemia     of chronic disease  . Hypothyroidism   . Gout   . Atrial fibrillation 07/2008    Failed cardioversion with TEE guidance in May 2010. Pt went back to NSR by 11/2008 with amiodarne  . Hypertension   . GERD (gastroesophageal  reflux disease)   . Peripheral vascular disease   . Pneumonia   . Chronic kidney disease   . Respiratory failure, chronic 07/01/2011  . Chronic gout due to renal impairment 07/01/2011   Past Surgical History  Procedure Laterality Date  . Tracheostomy      ARDS/ ICU admission, trach 2005  . Microperforation  08/2008    Of jejunem, ICU admission   Family History  Problem Relation Age of Onset  . Emphysema Mother     and asthma  . Asthma Mother   . Heart disease Neg Hx     premature   History  Substance Use Topics  . Smoking status: Former Smoker -- 1.00 packs/day for 23 years    Types: Cigarettes    Quit date: 04/11/1976  . Smokeless tobacco: Never Used     Comment: Started at age 74 (quit at age 25)  . Alcohol Use: No   OB History   Grav Para Term Preterm Abortions TAB SAB Ect Mult Living                 Review of Systems All other systems negative except as documented in the HPI. All pertinent positives and negatives as reviewed in the HPI. Allergies  Sulfonamide derivatives  Home Medications   Current Outpatient Rx  Name  Route  Sig  Dispense  Refill  . acetaminophen (TYLENOL) 500 MG tablet   Oral   Take 1,000 mg by mouth 2 (  two) times daily as needed for pain.         Marland Kitchen albuterol (PROVENTIL) (2.5 MG/3ML) 0.083% nebulizer solution   Nebulization   Take 3 mLs (2.5 mg total) by nebulization 2 (two) times daily. Dx: 492.8 COPD with Emphysema   270 mL   2   . bisoprolol (ZEBETA) 10 MG tablet   Oral   Take 5 mg by mouth daily.         . ciprofloxacin (CIPRO) 500 MG tablet   Oral   Take 500 mg by mouth 2 (two) times daily. For 10 days. Started 03/13/13         . diphenhydrAMINE (BENADRYL) 25 MG tablet   Oral   Take 25 mg by mouth at bedtime as needed for itching.         Marland Kitchen guaifenesin (HUMIBID E) 400 MG TABS   Oral   Take 400 mg by mouth 2 (two) times daily.          Marland Kitchen levothyroxine (SYNTHROID, LEVOTHROID) 50 MCG tablet   Oral   Take 50 mcg  by mouth daily.         . metroNIDAZOLE (FLAGYL) 500 MG tablet   Oral   Take 500 mg by mouth 2 (two) times daily. For 10 days. Started 03/13/13         . Multiple Vitamin (MULITIVITAMIN WITH MINERALS) TABS   Oral   Take 1 tablet by mouth daily.         . potassium chloride SA (K-DUR,KLOR-CON) 20 MEQ tablet   Oral   Take 20 mEq by mouth daily.         . predniSONE (DELTASONE) 10 MG tablet   Oral   Take 10 mg by mouth daily with breakfast.         . ranitidine (ZANTAC) 150 MG capsule   Oral   Take 150 mg by mouth every evening.           . simvastatin (ZOCOR) 40 MG tablet   Oral   Take 40 mg by mouth every evening.         . tiotropium (SPIRIVA HANDIHALER) 18 MCG inhalation capsule   Inhalation   Place 1 capsule (18 mcg total) into inhaler and inhale daily.   30 capsule   11   . torsemide (DEMADEX) 20 MG tablet   Oral   Take 80 mg by mouth daily.         Marland Kitchen warfarin (COUMADIN) 2 MG tablet   Oral   Take 2 mg by mouth every Monday, Wednesday, and Friday.         . warfarin (COUMADIN) 3 MG tablet   Oral   Take 3 mg by mouth every Tuesday, Thursday, Saturday, and Sunday.          BP 139/79  Pulse 99  Temp(Src) 99.1 F (37.3 C) (Oral)  Resp 20  Ht 5\' 3"  (1.6 m)  Wt 200 lb (90.719 kg)  BMI 35.44 kg/m2  SpO2 99% Physical Exam  Nursing note and vitals reviewed. Constitutional: She is oriented to person, place, and time. She appears well-developed and well-nourished. No distress.  HENT:  Head: Normocephalic and atraumatic.  Mouth/Throat: Oropharynx is clear and moist.  Eyes: Pupils are equal, round, and reactive to light.  Neck: Normal range of motion. Neck supple.  Cardiovascular: Normal rate, regular rhythm and normal heart sounds.  Exam reveals no gallop and no friction rub.   No murmur heard. Pulmonary/Chest: Tachypnea  noted. No apnea. No respiratory distress. She has wheezes. She has no rales.  Neurological: She is alert and oriented to  person, place, and time. She exhibits normal muscle tone. Coordination normal.  Skin: Skin is warm and dry. No rash noted.    ED Course  Procedures (including critical care time) Patient will be admitted to the hospital for further evaluation and care of her current condition.  Patient will be treated for community-acquired pneumonia.  Spoke with the, internal medicine resident, who will be down to admit the patient to the hospital  Carlyle Dolly, PA-C 03/20/13 1453

## 2013-03-20 NOTE — H&P (Signed)
Date: 03/20/2013               Patient Name:  [name] MRN: [id number]  DOB: 1930-09-12 Age / Sex: 79 y.o., female   PCP: Provider Not In System              Medical Service: Internal Medicine Teaching Service              Attending Physician: Dr. Burns Spain, MD    First Contact: Bernadette Hoit, MS III Pager: [phone]  Second Contact: Dr. Boykin Peek Pager: [phone]  Third Contact Dr. Dow Adolph Pager: [phone]       After Hours (After 5p/  First Contact Pager: [phone]  weekends / holidays): Second Contact Pager: [phone]   Chief Complaint: fever, chills, weakness, vomiting x1  History of Present Illness: [name] is a 79 year old woman with PMH of GERD, dCHF, CKD, hyperlipidemia, COPD, a-fib, PVD, hypothyroidism, diverticulosis, and multiple episodes of C diff who presents with a one-day history of fever, chills, weakness, and vomiting x1. She woke up this morning feeling cold and weak, and vomited once after having a few sips of water. Her daughter checked her temperature and found it was 101.4. She usually gets around with a walker, but she felt too weak to use it so her daughter had to help her into her wheelchair to come to the ED.  The patient has a history of multiple episodes of diverticulitis, with the last being in mid-November. She was prescribed a course of Augmentin, which improved her symptoms, she finished just beforeThanksgiving. Towards the end of her course she started having loose stools, and her daughter who is a nurse thought she had developed C diff, so she prescribed her Cipro and Flagyl. They held off starting these antibiotics, however, so she could have a few drinks on Thanksgiving. She developed LLQ pain and had 5-6 loose stools on 03/13/13, however, so she started the Cipro and Flagyl.  The patient felt some heartburn after dinner, so she took a Gaviscon which helped. She ate chicken breast and green beans last night, which is  typical food that she normally handles well. She is now having normal bowel movements, with the last one being yesterday. She denies nausea, blood in her vomit, sick contacts, travel, abdominal trauma, or recent falls. She has no history of abdominal surgery.    Meds: No current facility-administered medications for this encounter.   Medications Prior to Admission  Medication Sig Dispense Refill  . acetaminophen (TYLENOL) 500 MG tablet Take 1,000 mg by mouth 2 (two) times daily as needed for pain.      Marland Kitchen albuterol (PROVENTIL) (2.5 MG/3ML) 0.083% nebulizer solution Take 3 mLs (2.5 mg total) by nebulization 2 (two) times daily. Dx: 492.8 COPD with Emphysema  270 mL  2  . bisoprolol (ZEBETA) 10 MG tablet Take 5 mg by mouth daily.      . ciprofloxacin (CIPRO) 500 MG tablet Take 500 mg by mouth 2 (two) times daily. For 10 days. Started 03/13/13      . diphenhydrAMINE (BENADRYL) 25 MG tablet Take 25 mg by mouth at bedtime as needed for itching.      Marland Kitchen guaifenesin (HUMIBID E) 400 MG TABS Take 400 mg by mouth 2 (two) times daily.       Marland Kitchen levothyroxine (SYNTHROID, LEVOTHROID) 50 MCG tablet Take 50 mcg by mouth daily.      . metroNIDAZOLE (FLAGYL) 500 MG tablet Take 500 mg by mouth  2 (two) times daily. For 10 days. Started 03/13/13      . Multiple Vitamin (MULITIVITAMIN WITH MINERALS) TABS Take 1 tablet by mouth daily.      . potassium chloride SA (K-DUR,KLOR-CON) 20 MEQ tablet Take 20 mEq by mouth daily.      . predniSONE (DELTASONE) 10 MG tablet Take 10 mg by mouth daily with breakfast.      . ranitidine (ZANTAC) 150 MG capsule Take 150 mg by mouth every evening.        . simvastatin (ZOCOR) 40 MG tablet Take 40 mg by mouth every evening.      . tiotropium (SPIRIVA HANDIHALER) 18 MCG inhalation capsule Place 1 capsule (18 mcg total) into inhaler and inhale daily.  30 capsule  11  . torsemide (DEMADEX) 20 MG tablet Take 80 mg by mouth daily.      Marland Kitchen warfarin (COUMADIN) 2 MG tablet Take 2 mg by mouth  every Monday, Wednesday, and Friday.      . warfarin (COUMADIN) 3 MG tablet Take 3 mg by mouth every Tuesday, Thursday, Saturday, and Sunday.        Allergies: Allergies as of 03/20/2013 - Review Complete 03/20/2013  Allergen Reaction Noted  . Sulfonamide derivatives     Past Medical History  Diagnosis Date  . Diabetes mellitus   . Obesity   . COPD (chronic obstructive pulmonary disease)     Moderate. PFTs (12/10): FVC 67%, FEV1 58%, ratio 60%, TLC 95%, RV 138%, DLCO 43% (moderate obstructive defect). She is on home oxygen that should be worn at all times.  . Diastolic CHF, chronic     Echo (3/12): EF 55-60%, mild LV hypertrophy, grade I diastolic dysfunction, mild left atrial enlargement, normal pulmonary artery pressure  . CKD (chronic kidney disease)     Cr 2.4 when last checked. 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 PNA (pneumonia)     h/o with ARDS in 2005; MRSA PNA with prolonged hospitalization in [facility] 11/11  . Diverticulum     Jejunal  . OSA (obstructive sleep apnea)     possible. When pt was sedated for TEE in May 2010, she did develop some upper airway obstruction  . Anemia     of chronic disease  . Hypothyroidism   . Gout   . Atrial fibrillation 07/2008    Failed cardioversion with TEE guidance in May 2010. Pt went back to NSR by 11/2008 with amiodarne  . Hypertension   . GERD (gastroesophageal reflux disease)   . Peripheral vascular disease   . Pneumonia   . Chronic kidney disease   . Respiratory failure, chronic 07/01/2011  . Chronic gout due to renal impairment 07/01/2011   Past Surgical History  Procedure Laterality Date  . Tracheostomy      ARDS/ ICU admission, trach 2005  . Microperforation  08/2008    Of jejunem, ICU admission   Family History  Problem Relation Age of Onset  . Emphysema Mother     and asthma  . Asthma Mother   . Heart disease Neg Hx     premature   Social History -Lives with daughter and  son-in law in [place] -Gets up and moves around the house everyday, likes to take trips to the store and outside (not primarily bedbound) -Tobacco: former smoker, quit in '77. Smoked 1 ppd for 20 yrs -Alcohol: 1 glass of wine a week -Illicit drugs: none   Review of Systems: Constitutional: per HPI HEENT:  no change in vision, sore throat, dysphagia CV: no chest pain or palpitations Pulm: no cough or SOB GI: per HPI GU: no dysuria or polyuria   Physical Exam: Blood pressure 118/56, pulse 88, temperature 102.8 F (39.3 C), temperature source Rectal, resp. rate 18, height 5\' 3"  (1.6 m), weight 200 lb (90.719 kg), SpO2 93.00%. General: obese woman lying in ED bed in NAD HEENT: PERRL, EOMI, no scleral icterus, moist mucous membranes, no oropharyngeal exudates or erythema CV: irregular rhythm, normal rate, no murmurs auscultated, 2+ radial and dorsalis pedis pulses Pulm: minimal wheezes in lung bases Abd: protuberant (but not distended per patient and her daughter), tender to deep palpation in RUQ and RLQ, rebound tenderness to deep palpation in LLQ, no fluid shift or evidence of ascites, negative Murphy's sign, positive bowel sounds, no CVA tenderness Ext: 1+ pitting lower extremity edema bilaterally; dry, shiny, erythematous lower extremities tender to palpation Neuro: CNs II-XII grossly intact Psych: knew where she was, knew the date   Physical Exam  Lab results: CBC    Component Value Date/Time   WBC 11.3* 03/20/2013 1110   RBC 3.94 03/20/2013 1110   HGB 12.0 03/20/2013 1110   HCT 38.9 03/20/2013 1110   PLT 205 03/20/2013 1110   MCV 98.7 03/20/2013 1110   MCH 30.5 03/20/2013 1110   MCHC 30.8 03/20/2013 1110   RDW 16.5* 03/20/2013 1110   LYMPHSABS 0.6* 03/20/2013 1110   MONOABS 0.5 03/20/2013 1110   EOSABS 0.0 03/20/2013 1110   BASOSABS 0.0 03/20/2013 1110   CMP     Component Value Date/Time   NA 137 03/20/2013 1110   K 4.1 03/20/2013 1110   CL 93* 03/20/2013 1110    CO2 27 03/20/2013 1110   GLUCOSE 202* 03/20/2013 1110   BUN 83* 03/20/2013 1110   CREATININE 2.15* 03/20/2013 1110   CREATININE 1.90* 03/23/2012 1621   CALCIUM 10.1 03/20/2013 1110   PROT 7.3 03/20/2013 1110   ALBUMIN 3.5 03/20/2013 1110   AST 828* 03/20/2013 1110   ALT 643* 03/20/2013 1110   ALKPHOS 803* 03/20/2013 1110   BILITOT 0.9 03/20/2013 1110   GFRNONAA 21* 03/20/2013 1110   GFRAA 24* 03/20/2013 1110   Lipase     Component Value Date/Time   LIPASE 23 03/20/2013 1323   Venous lactic acid: 2.92 PT: 40.3 INR: 4.4  Imaging results:  Ct Abdomen Pelvis Wo Contrast  03/20/2013   CLINICAL DATA:  Nausea.  Fever.  Fatigue.  Abdominal pain.  EXAM: CT ABDOMEN AND PELVIS WITHOUT CONTRAST  TECHNIQUE: Multidetector CT imaging of the abdomen and pelvis was performed following the standard protocol without intravenous contrast.  COMPARISON:  04/18/2011  FINDINGS: Prominent epicardial adipose tissue. Mild atelectasis in the left lower lobe. Prior bilateral pleural effusions have resolved. There is cardiomegaly along with calcification of the aortic and mitral valves, and coronary artery atherosclerotic calcification.  Prominent lateral segment left hepatic lobe. Small hypodensity in segment 3 along the falciform ligament likely represents focal fatty infiltration. Spleen and adrenal glands unremarkable in non-contrast CT appearance. Pancreas unremarkable.  Dependent abnormal density in the gallbladder may represent layering gallstones or sludge. Gallbladder moderately distended at 6 cm in diameter.  Stable 1.1 cm exophytic lesion of the posterior right mid kidney. Bilateral renal atrophy. Vascular calcifications along the left renal hilum.  No hydronephrosis or ureteral calculus. Several small bladder cellules are present.  Appendix normal. Scattered air-fluid levels are in the nondilated proximal loops of small bowel.  Aortoiliac atherosclerotic calcification noted do  the  There is a 6.6 by 3.6  by 4.0 cm abscess along the posterior margin of the sigmoid colon shown on images 63-72 of series 2. This is adjacent to the left adnexa and contains fluid, gas, and stool like material. It may be continuous with the colon. This abscess extends back towards the left pelvic sidewall and obturator internus muscle, and is adjacent to the left external iliac vasculature. This likely represents a diverticular abscess.  Solid interbody fusion noted at L4-5. Severe loss of disc height at L3-4 with posterior osseous ridging and facet arthropathy. No lumbar spine fracture or acute subluxation.  IMPRESSION: 1. Sigmoid diverticular abscess extends posterolaterally from the sigmoid colon on the left towards the left pelvic sidewall, and contains gas, fluid, and stool like material. Uncertain whether this is connected/draining into the sigmoid colon. The extraluminal gas appears walled off and is not free within the peritoneal space. 2. Colonic diverticulosis. 3. Sludge versus cholelithiasis in the borderline distended gallbladder. 4. Atherosclerosis. 5. Prominent lateral segment left hepatic lobe, which can be an early sign of cirrhotic liver disease. 6. Bilateral renal atrophy.   Electronically Signed   By: Herbie Baltimore M.D.   On: 03/20/2013 15:57   Dg Chest 2 View  03/20/2013   CLINICAL DATA:  Nausea and fatigue.  Fever.  EXAM: CHEST  2 VIEW  COMPARISON:  Two-view chest 04/20/2012.  FINDINGS: The heart is enlarged. Patient is rotated on the lateral view. A left pleural effusion is suspected. Left basilar airspace disease is present as well.  IMPRESSION: 1. Asymmetric left pleural effusion and airspace disease is concerning for pneumonia. 2. Stable cardiomegaly without failure.   Electronically Signed   By: Gennette Pac M.D.   On: 03/20/2013 12:23    Other results: EKG: a-fib w/ borderline RVR (101 bpm), normal axis, no evidence of hypertrophy, normal QRS duration, QRS has low voltage  Assessment & Plan by  Problem: Ms. Jannie Doyle is a 79 year old woman with PMH of GERD, dCHF, CKD, hyperlipidemia, COPD, a-fib, PVD, hypothyroidism, diverticulosis, and multiple episodes of C diff who presents with a one-day history of fever, chills, weakness, and vomiting x1.  Fever, chills, weakness, vomiting x1 Symptom constellation most likely due to sigmoid diverticular abscess seen on abdominal CT. If there is communication into the sigmoid colon, can cause periodic fever spikes, nausea, vomiting, diarrhea, etc. Patient is on day 7 of a 10-day course of  Cipro and Flagyl, which may have been treating the abscess. Also, if she has/had C diff, this should be treating it, unless is refractory to Flagyl. ED started her on IV Rocephin and azithromycin.. Met 3/4 SIRS criteria in ED (Tmax 102.8, RR >20, HR >100), and has elevated lactic acid, so bacteremia should be investigated. -Surgery consult for abscess drainage -Continue IV Rocephin and azithromycin -D/c oral Cipro and Flagyl, start IV metronidazole 500 mg q8 hrs -Blood cultures x2 -C diff PCR if diarrhea recurs or symptoms don't resolve after abscess drainage  Elevated liver enzymes AST 828, ALT 643, alk phos 803, total bili 0.9. Possibly due to NASH (fatty infiltrate seen on abdominal CT). Possibly due to cholelithiasis. Abdominal CT showed mildly distended gallbladder with evidence of sludge vs cholelithiasis. Cholelithiasis seen on previous abdominal CTs (Jan 2013 and Dec 2012). Viral hepatitis is a possibility, but liver enzymes were normal in Feb 2013 making Hep B/C unlikely, and the patient has no history of travel (Hep A/E). Hepatic damage from cardiac back pressure is less likely given no  signs of CHF exacerbation (weight is stable, no evidence on CXR). Hepatic venous outflow obstruction not seen on abdominal CT. -Follow on CMP, if not improving tomorrow will do abdominal US to evaluate for gallstones -Hold Tylenol and simvastatin   Left pleural  effusion and concern for left lower lobe pneumonia on CXR Patient only has mild bilateral wheezes, no crackles. Not subjectively SOB. CXR similar to previous from Jan 2010 when didn't have pneumonia, although diaphragm border slightly more obscured. -Repeat CXR in morning -IV azithromycin and Rocephin  A-fib with borderline RVR Chronic a-fib, has failed cardioversion w/ TEE guidance in May 2010. The patient is on long-term warfarin. Supratherapeutic on admission with INR of 4.4, was in therapeutic range of 2.7 on 02/19/13. -Admit to telemetry -Pharmacy consult to determine warfarin dosage -Daily PT/INR check  COPD GOLD stage C on 2 liters O2 at home. Can go off of O2 while going out if she takes it easy, but can't go for extended periods without it. Has received flu vaccine this year.  -Continue home tiotropium 18 mcg inhaler daily -Continue home albuterol 2.5 mg neb q4 hrs PRN -2 L O2 nasal canula PRN  CKD Baseline creatinine of 1.8-2.1 since March 2013. Creatinine of 2.15 on admission. 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 -Continue prednisone 10 mg daily -Follow renal function on BMET  Diastolic CHF -Continue home bisoprolol 5 mg daily -Torsemide 80 mg daily (holding today b/c not eaten anything)  Hypothyroidism -Continue home levothyroxine 50 mcg daily  F/E/N -Regular diet   This is a Psychologist, occupational Note.  The care of the patient was discussed with Dr. Delane Ginger and the assessment and plan was formulated with their assistance.  Please see their note for official documentation of the patient encounter.   Signed: Arn Medal, Med Student 03/20/2013, 4:27 PM

## 2013-03-20 NOTE — ED Provider Notes (Signed)
Medical screening examination/treatment/procedure(s) were performed by non-physician practitioner and as supervising physician I was immediately available for consultation/collaboration.    Nelia Shi, MD 03/20/13 [phone]

## 2013-03-20 NOTE — Progress Notes (Signed)
ANTICOAGULATION CONSULT NOTE - Initial Consult  Pharmacy Consult for coumadin Indication: atrial fibrillation  Allergies  Allergen Reactions  . Sulfonamide Derivatives     Childhood reaction    Patient Measurements: Height: 5\' 3"  (160 cm) Weight: 198 lb 4.8 oz (89.948 kg) IBW/kg (Calculated) : 52.4 Heparin Dosing Weight:   Vital Signs: Temp: 97.8 F (36.6 C) (12/10 1637) Temp src: Oral (12/10 1637) BP: 110/60 mmHg (12/10 1637) Pulse Rate: 56 (12/10 1637)  Labs:  Recent Labs  03/20/13 1110 03/20/13 1323  HGB 12.0  --   HCT 38.9  --   PLT 205  --   LABPROT  --  40.3*  INR  --  4.40*  CREATININE 2.15*  --     Estimated Creatinine Clearance: 22.2 ml/min (by C-G formula based on Cr of 2.15).   Medical History: Past Medical History  Diagnosis Date  . Diabetes mellitus   . Obesity   . COPD (chronic obstructive pulmonary disease)     Moderate. PFTs (12/10): FVC 67%, FEV1 58%, ratio 60%, TLC 95%, RV 138%, DLCO 43% (moderate obstructive defect). She is on home oxygen that should be worn at all times.  . Diastolic CHF, chronic     Echo (3/12): EF 55-60%, mild LV hypertrophy, grade I diastolic dysfunction, mild left atrial enlargement, normal pulmonary artery pressure  . CKD (chronic kidney disease)     Cr 2.4 when last checked. 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 PNA (pneumonia)     h/o with ARDS in 2005; MRSA PNA with prolonged hospitalization in [facility] 11/11  . Diverticulum     Jejunal  . OSA (obstructive sleep apnea)     possible. When pt was sedated for TEE in May 2010, she did develop some upper airway obstruction  . Anemia     of chronic disease  . Hypothyroidism   . Gout   . Atrial fibrillation 07/2008    Failed cardioversion with TEE guidance in May 2010. Pt went back to NSR by 11/2008 with amiodarne  . Hypertension   . GERD (gastroesophageal reflux disease)   . Peripheral vascular disease   . Pneumonia   .  Chronic kidney disease   . Respiratory failure, chronic 07/01/2011  . Chronic gout due to renal impairment 07/01/2011    Medications:  Scheduled:  . bisoprolol  5 mg Oral Daily  . levothyroxine  50 mcg Oral Daily  . [START ON 03/21/2013] predniSONE  10 mg Oral Q breakfast  . sodium chloride  3 mL Intravenous Q12H  . tiotropium  18 mcg Inhalation Daily   Infusions:    Assessment: 79 yo female with afib is on supratherapeutic coumadin.  INR today is 4.4.  Prior to admission, patient was on coumadin 2mg  MWF and 3mg  other days. Last dose was 03/19/13 Goal of Therapy:  INR 2-3 Monitor platelets by anticoagulation protocol: Yes   Plan:  1) No coumadin tonight 2) INR in am  Jamare Vanatta, Tsz-Yin 03/20/2013,4:54 PM

## 2013-03-20 NOTE — ED Notes (Signed)
Daughter is NP and very good historian. Pt is complaining of weakness and saturations are low. Lungs are clear.

## 2013-03-20 NOTE — Consult Note (Signed)
Reason for Consult:Abdominal pain and diverticular abscess Referring Physician: [name] is an 79 y.o. female.  HPI: Patient admitted today with fever, chills and abdominal discomfort, although she claims that she does not have any abdominal pain.  Normal bowel movements.  No blood in stools, just did not feel well CT scan of the abdomen and pelvis showed what was interpreted as a diverticulitis with an abscess extending to the left pelvic wall containing stool like material.  However, to me this could be redundant colon.  Surgery consultation obtained.  Her transaminases are also markedly elevated.  Past Medical History  Diagnosis Date  . Diabetes mellitus   . Obesity   . COPD (chronic obstructive pulmonary disease)     Moderate. PFTs (12/10): FVC 67%, FEV1 58%, ratio 60%, TLC 95%, RV 138%, DLCO 43% (moderate obstructive defect). She is on home oxygen that should be worn at all times.  . Diastolic CHF, chronic     Echo (3/12): EF 55-60%, mild LV hypertrophy, grade I diastolic dysfunction, mild left atrial enlargement, normal pulmonary artery pressure  . CKD (chronic kidney disease)     Cr 2.4 when last checked. ANCA +, pauci-immune glomerulnephritis followed by Dr. Arrie Burch  . PNA (pneumonia)     h/o with ARDS in 2005; MRSA PNA with [name]  . Diverticulum     Jejunal  . OSA (obstructive sleep apnea)     possible. When pt was sedated for TEE in May 2010, she did develop some upper airway obstruction  . Anemia     of chronic disease  . Hypothyroidism   . Gout   . Atrial fibrillation 07/2008    Failed cardioversion with TEE guidance in May 2010. Pt went back to NSR by 11/2008 with amiodarne  . Hypertension   . GERD (gastroesophageal reflux disease)   . Peripheral vascular disease   . Pneumonia   . Chronic kidney disease   . Respiratory failure, chronic 07/01/2011  . Chronic gout due to renal impairment 07/01/2011    Past  Surgical History  Procedure Laterality Date  . Tracheostomy      ARDS/ ICU admission, trach 2005  . Microperforation  08/2008    Of jejunem, ICU admission    Family History  Problem Relation Age of Onset  . Emphysema Mother     and asthma  . Asthma Mother   . Heart disease Neg Hx     premature    Social History:  reports that she quit smoking about 36 years ago. Her smoking use included Cigarettes. She has a 23 pack-year smoking history. She has never used smokeless tobacco. She reports that she drinks alcohol. She reports that she does not use illicit drugs.  Allergies:  Allergies  Allergen Reactions  . Sulfonamide Derivatives     Childhood reaction    Medications: I have reviewed the patient's current medications.  Results for orders placed during the hospital encounter of 03/20/13 (from the past 48 hour(s))  CBC WITH DIFFERENTIAL     Status: Abnormal   Collection Time    03/20/13 11:10 AM      Result Value Range   WBC 11.3 (*) 4.0 - 10.5 K/uL   RBC 3.94  3.87 - 5.11 MIL/uL   Hemoglobin 12.0  12.0 - 15.0 g/dL   HCT 46.9  62.9 - 52.8 %   MCV 98.7  78.0 - 100.0 fL   MCH 30.5  26.0 - 34.0 pg  MCHC 30.8  30.0 - 36.0 g/dL   RDW 81.1 (*) 91.4 - 78.2 %   Platelets 205  150 - 400 K/uL   Neutrophils Relative % 90 (*) 43 - 77 %   Neutro Abs 10.2 (*) 1.7 - 7.7 K/uL   Lymphocytes Relative 5 (*) 12 - 46 %   Lymphs Abs 0.6 (*) 0.7 - 4.0 K/uL   Monocytes Relative 4  3 - 12 %   Monocytes Absolute 0.5  0.1 - 1.0 K/uL   Eosinophils Relative 0  0 - 5 %   Eosinophils Absolute 0.0  0.0 - 0.7 K/uL   Basophils Relative 0  0 - 1 %   Basophils Absolute 0.0  0.0 - 0.1 K/uL  COMPREHENSIVE METABOLIC PANEL     Status: Abnormal   Collection Time    03/20/13 11:10 AM      Result Value Range   Sodium 137  135 - 145 mEq/L   Potassium 4.1  3.5 - 5.1 mEq/L   Chloride 93 (*) 96 - 112 mEq/L   CO2 27  19 - 32 mEq/L   Glucose, Bld 202 (*) 70 - 99 mg/dL   BUN 83 (*) 6 - 23 mg/dL    Creatinine, Ser 9.56 (*) 0.50 - 1.10 mg/dL   Calcium 21.3  8.4 - 08.6 mg/dL   Total Protein 7.3  6.0 - 8.3 g/dL   Albumin 3.5  3.5 - 5.2 g/dL   AST 578 (*) 0 - 37 U/L   ALT 643 (*) 0 - 35 U/L   Alkaline Phosphatase 803 (*) 39 - 117 U/L   Total Bilirubin 0.9  0.3 - 1.2 mg/dL   GFR calc non Af Amer 21 (*) >90 mL/min   GFR calc Af Amer 24 (*) >90 mL/min   Comment: (NOTE)     The eGFR has been calculated using the CKD EPI equation.     This calculation has not been validated in all clinical situations.     eGFR's persistently <90 mL/min signify possible Chronic Kidney     Disease.  CG4 I-STAT (LACTIC ACID)     Status: Abnormal   Collection Time    03/20/13 11:43 AM      Result Value Range   Lactic Acid, Venous 2.92 (*) 0.5 - 2.2 mmol/L  URINALYSIS, ROUTINE W REFLEX MICROSCOPIC     Status: Abnormal   Collection Time    03/20/13 12:15 PM      Result Value Range   Color, Urine YELLOW  YELLOW   APPearance CLEAR  CLEAR   Specific Gravity, Urine 1.011  1.005 - 1.030   pH 5.0  5.0 - 8.0   Glucose, UA NEGATIVE  NEGATIVE mg/dL   Hgb urine dipstick MODERATE (*) NEGATIVE   Bilirubin Urine NEGATIVE  NEGATIVE   Ketones, ur NEGATIVE  NEGATIVE mg/dL   Protein, ur NEGATIVE  NEGATIVE mg/dL   Urobilinogen, UA 0.2  0.0 - 1.0 mg/dL   Nitrite NEGATIVE  NEGATIVE   Leukocytes, UA SMALL (*) NEGATIVE  URINE MICROSCOPIC-ADD ON     Status: Abnormal   Collection Time    03/20/13 12:15 PM      Result Value Range   Squamous Epithelial / LPF FEW (*) RARE   WBC, UA 11-20  <3 WBC/hpf   RBC / HPF 3-6  <3 RBC/hpf   Bacteria, UA FEW (*) RARE  LIPASE, BLOOD     Status: None   Collection Time    03/20/13  1:23 PM  Result Value Range   Lipase 23  11 - 59 U/L  PROTIME-INR     Status: Abnormal   Collection Time    03/20/13  1:23 PM      Result Value Range   Prothrombin Time 40.3 (*) 11.6 - 15.2 seconds   INR 4.40 (*) 0.00 - 1.49    Ct Abdomen Pelvis Wo Contrast  03/20/2013   CLINICAL DATA:  Nausea.   Fever.  Fatigue.  Abdominal pain.  EXAM: CT ABDOMEN AND PELVIS WITHOUT CONTRAST  TECHNIQUE: Multidetector CT imaging of the abdomen and pelvis was performed following the standard protocol without intravenous contrast.  COMPARISON:  04/18/2011  FINDINGS: Prominent epicardial adipose tissue. Mild atelectasis in the left lower lobe. Prior bilateral pleural effusions have resolved. There is cardiomegaly along with calcification of the aortic and mitral valves, and coronary artery atherosclerotic calcification.  Prominent lateral segment left hepatic lobe. Small hypodensity in segment 3 along the falciform ligament likely represents focal fatty infiltration. Spleen and adrenal glands unremarkable in non-contrast CT appearance. Pancreas unremarkable.  Dependent abnormal density in the gallbladder may represent layering gallstones or sludge. Gallbladder moderately distended at 6 cm in diameter.  Stable 1.1 cm exophytic lesion of the posterior right mid kidney. Bilateral renal atrophy. Vascular calcifications along the left renal hilum.  No hydronephrosis or ureteral calculus. Several small bladder cellules are present.  Appendix normal. Scattered air-fluid levels are in the nondilated proximal loops of small bowel.  Aortoiliac atherosclerotic calcification noted do the  There is a 6.6 by 3.6 by 4.0 cm abscess along the posterior margin of the sigmoid colon shown on images 63-72 of series 2. This is adjacent to the left adnexa and contains fluid, gas, and stool like material. It may be continuous with the colon. This abscess extends back towards the left pelvic sidewall and obturator internus muscle, and is adjacent to the left external iliac vasculature. This likely represents a diverticular abscess.  Solid interbody fusion noted at L4-5. Severe loss of disc height at L3-4 with posterior osseous ridging and facet arthropathy. No lumbar spine fracture or acute subluxation.  IMPRESSION: 1. Sigmoid diverticular abscess  extends posterolaterally from the sigmoid colon on the left towards the left pelvic sidewall, and contains gas, fluid, and stool like material. Uncertain whether this is connected/draining into the sigmoid colon. The extraluminal gas appears walled off and is not free within the peritoneal space. 2. Colonic diverticulosis. 3. Sludge versus cholelithiasis in the borderline distended gallbladder. 4. Atherosclerosis. 5. Prominent lateral segment left hepatic lobe, which can be an early sign of cirrhotic liver disease. 6. Bilateral renal atrophy.   Electronically Signed   By: Herbie Baltimore M.D.   On: 03/20/2013 15:57   Dg Chest 2 View  03/20/2013   CLINICAL DATA:  Nausea and fatigue.  Fever.  EXAM: CHEST  2 VIEW  COMPARISON:  Two-view chest 04/20/2012.  FINDINGS: The heart is enlarged. Patient is rotated on the lateral view. A left pleural effusion is suspected. Left basilar airspace disease is present as well.  IMPRESSION: 1. Asymmetric left pleural effusion and airspace disease is concerning for pneumonia. 2. Stable cardiomegaly without failure.   Electronically Signed   By: Gennette Pac M.D.   On: 03/20/2013 12:23    Review of Systems  Constitutional: Positive for fever and chills.  HENT: Negative.   Eyes: Negative.   Respiratory: Negative.   Cardiovascular: Negative.   Gastrointestinal: Positive for nausea and abdominal pain.  Genitourinary: Negative.   Musculoskeletal: Negative.  Skin: Negative.   Neurological: Positive for weakness.  Endo/Heme/Allergies: Negative.   Psychiatric/Behavioral: Negative.    Blood pressure 110/60, pulse 56, temperature 97.8 F (36.6 C), temperature source Oral, resp. rate 20, height 5\' 3"  (1.6 m), weight 89.948 kg (198 lb 4.8 oz), SpO2 98.00%. Physical Exam  Nursing note and vitals reviewed. Constitutional: She is oriented to person, place, and time. She appears well-nourished.  Morbidly obese  HENT:  Head: Normocephalic and atraumatic.  Eyes:  Conjunctivae and EOM are normal. Pupils are equal, round, and reactive to light.  Neck: Normal range of motion. Neck supple.  Cardiovascular: Normal rate, regular rhythm, normal heart sounds and intact distal pulses.   Respiratory: Effort normal and breath sounds normal.  GI: Soft. She exhibits distension (but normally looks distended.). Bowel sounds are decreased. There is no hepatosplenomegaly. There is tenderness in the left lower quadrant. There is rebound and guarding. There is no CVA tenderness. A hernia (umbilical) is present.  Neurological: She is alert and oriented to person, place, and time.  Skin: Skin is dry.  Feels febrile to touch   Psychiatric: She has a normal mood and affect. Her behavior is normal. Judgment and thought content normal.    Assessment/Plan: Fevers and chills Sigmoid diverticulitis with reported peridiverticular abscess hugging the left pelvic wall.  Possible this could be redundant sigmoid colon on the pelvic wall will have to review with the radiologist. The patient's LFT's are markedly elevated and there is a suggestion of gallstones on her CT scan.  Since there is some concern, an ultrasound of the abdomen should be done to confirme cholelithiasis and /or cholecystitis.    Elevated INR above therapeutic--contraindicating any invasive procedure at this point.  Would give patient Vitamin K to reverse coumadin.  Would not give FFP now unless surgery needed urgently.  We will follow the patient while in the hospital.  She may need surgery at some point, but if this is certainly determined to be an abscess, then an attempt at a percutaneous drain would be in order..  She should be on IV antibiotics and I would consider Invanz.  She has a history of MRSA and C.diff--contact precautions may be in order.  [name] 03/20/2013, 6:56 PM

## 2013-03-20 NOTE — H&P (Signed)
I repeated the critical or key portions of the exam.  I confirmed/revised the medical student's history, exam, assessment and plan.   

## 2013-03-21 ENCOUNTER — Observation Stay (HOSPITAL_COMMUNITY): Payer: Medicare Other

## 2013-03-21 DIAGNOSIS — K5732 Diverticulitis of large intestine without perforation or abscess without bleeding: Principal | ICD-10-CM

## 2013-03-21 DIAGNOSIS — A419 Sepsis, unspecified organism: Secondary | ICD-10-CM

## 2013-03-21 DIAGNOSIS — R7989 Other specified abnormal findings of blood chemistry: Secondary | ICD-10-CM

## 2013-03-21 DIAGNOSIS — R740 Nonspecific elevation of levels of transaminase and lactic acid dehydrogenase [LDH]: Secondary | ICD-10-CM

## 2013-03-21 LAB — URINE CULTURE
Colony Count: NO GROWTH
Culture: NO GROWTH

## 2013-03-21 LAB — CBC
HCT: 34.1 % — ABNORMAL LOW (ref 36.0–46.0)
MCHC: 30.5 g/dL (ref 30.0–36.0)
MCV: 100.3 fL — ABNORMAL HIGH (ref 78.0–100.0)
Platelets: 186 10*3/uL (ref 150–400)
RBC: 3.4 MIL/uL — ABNORMAL LOW (ref 3.87–5.11)
RDW: 16.5 % — ABNORMAL HIGH (ref 11.5–15.5)
WBC: 8.5 10*3/uL (ref 4.0–10.5)

## 2013-03-21 LAB — COMPREHENSIVE METABOLIC PANEL
ALT: 415 U/L — ABNORMAL HIGH (ref 0–35)
Albumin: 2.7 g/dL — ABNORMAL LOW (ref 3.5–5.2)
Alkaline Phosphatase: 572 U/L — ABNORMAL HIGH (ref 39–117)
BUN: 71 mg/dL — ABNORMAL HIGH (ref 6–23)
Chloride: 98 mEq/L (ref 96–112)
Creatinine, Ser: 2.09 mg/dL — ABNORMAL HIGH (ref 0.50–1.10)
GFR calc Af Amer: 25 mL/min — ABNORMAL LOW (ref >90)
Glucose, Bld: 107 mg/dL — ABNORMAL HIGH (ref 70–99)
Potassium: 3.6 mEq/L (ref 3.5–5.1)
Sodium: 138 mEq/L (ref 135–145)
Total Bilirubin: 0.6 mg/dL (ref 0.3–1.2)
Total Protein: 6.2 g/dL (ref 6.0–8.3)

## 2013-03-21 LAB — PROTIME-INR
INR: 1.84 — ABNORMAL HIGH (ref 0.00–1.49)
Prothrombin Time: 20.7 seconds — ABNORMAL HIGH (ref 11.6–15.2)

## 2013-03-21 MED ORDER — WARFARIN SODIUM 2 MG PO TABS
2.0000 mg | ORAL_TABLET | Freq: Once | ORAL | Status: AC
  Administered 2013-03-21: 18:00:00 2 mg via ORAL
  Filled 2013-03-21: qty 1

## 2013-03-21 MED ORDER — ALBUTEROL SULFATE (5 MG/ML) 0.5% IN NEBU
2.5000 mg | INHALATION_SOLUTION | Freq: Two times a day (BID) | RESPIRATORY_TRACT | Status: DC
  Administered 2013-03-21 – 2013-03-24 (×6): 2.5 mg via RESPIRATORY_TRACT
  Filled 2013-03-21 (×7): qty 0.5

## 2013-03-21 MED ORDER — LEVOFLOXACIN IN D5W 750 MG/150ML IV SOLN: 750.0000 mg | INTRAVENOUS | Status: DC

## 2013-03-21 MED ORDER — HEPARIN SODIUM (PORCINE) 5000 UNIT/ML IJ SOLN
5000.0000 [IU] | Freq: Three times a day (TID) | INTRAMUSCULAR | Status: DC
  Administered 2013-03-21 – 2013-03-24 (×9): 5000 [IU] via SUBCUTANEOUS
  Filled 2013-03-21 (×11): qty 1

## 2013-03-21 MED ORDER — WARFARIN - PHARMACIST DOSING INPATIENT: Freq: Every day | Status: DC

## 2013-03-21 MED ORDER — LEVOFLOXACIN IN D5W 750 MG/150ML IV SOLN
750.0000 mg | INTRAVENOUS | Status: DC
  Administered 2013-03-22: 750 mg via INTRAVENOUS
  Filled 2013-03-21: qty 150

## 2013-03-21 MED ORDER — PREDNISONE 10 MG PO TABS
10.0000 mg | ORAL_TABLET | Freq: Every day | ORAL | Status: DC
  Administered 2013-03-24: 10 mg via ORAL
  Filled 2013-03-21 (×2): qty 1

## 2013-03-21 MED ORDER — CIPROFLOXACIN IN D5W 400 MG/200ML IV SOLN
400.0000 mg | INTRAVENOUS | Status: DC
  Administered 2013-03-21: 09:00:00 400 mg via INTRAVENOUS
  Filled 2013-03-21 (×2): qty 200

## 2013-03-21 MED ORDER — PREDNISONE 10 MG PO TABS
30.0000 mg | ORAL_TABLET | Freq: Every day | ORAL | Status: AC
  Administered 2013-03-21 – 2013-03-23 (×3): 30 mg via ORAL
  Filled 2013-03-21 (×3): qty 1

## 2013-03-21 MED ORDER — CIPROFLOXACIN IN D5W 400 MG/200ML IV SOLN: 400.0000 mg | Freq: Two times a day (BID) | INTRAVENOUS | Status: DC

## 2013-03-21 NOTE — Progress Notes (Signed)
PT Cancellation Note  Patient Details Name: [name] MRN: [id number] DOB: 1930/09/08   Cancelled Treatment:    Reason Eval/Treat Not Completed: Patient at procedure or test/unavailable   Toney Sang Beth 03/21/2013, 8:13 AM [name], PT [phone]

## 2013-03-21 NOTE — Progress Notes (Signed)
Pt's daughter requesting that pt wanted to have some ice chips.  Notified Dr Delane Ginger and instructed that pt can have some.Amanda Pea, Charity fundraiser.

## 2013-03-21 NOTE — Progress Notes (Signed)
On call MD for tonight made aware that pt had a positive MRSA swab and placed on contact isolation.  No new orders given.  Will continue to monitor.  Amanda Pea, RN

## 2013-03-21 NOTE — Progress Notes (Signed)
I repeated the critical or key portions of the exam.  I confirmed/revised the medical student's history, exam, assessment and plan.   

## 2013-03-21 NOTE — Evaluation (Signed)
Physical Therapy Evaluation Patient Details Name: UMEKA WRENCH MRN: [id number] DOB: 1930/05/04 Today's Date: 03/21/2013 Time: 4098-1191 PT Time Calculation (min): 31 min  PT Assessment / Plan / Recommendation History of Present Illness  Ms. Saryna Kneeland is a 79 year old woman with PMH of GERD, dCHF, CKD, hyperlipidemia, COPD, a-fib, PVD, hypothyroidism, diverticulosis, and multiple episodes of C diff who presents with a one-day history of fever, chills, weakness, and vomiting x1. She woke up this morning feeling cold and weak, and vomited once after having a few sips of water. Her daughter checked her temperature and found it was 101.4. She usually gets around with a walker, but she felt too weak to use it so her daughter had to help her into her wheelchair to come to the ED  Clinical Impression  Patient able to transfer out of bed, ambulate and return to her recliner with min assist and RW on 2L O2. Treatment limited by patient reported fatigue. She is eager to return home with her family. She can benefit from further PT services acutely in order to address deficits listed below, to increase independence at home and decrease caregiver burden.     PT Assessment  Patient needs continued PT services    Follow Up Recommendations  Home health PT    Does the patient have the potential to tolerate intense rehabilitation      Barriers to Discharge        Equipment Recommendations  None recommended by PT    Recommendations for Other Services     Frequency Min 3X/week    Precautions / Restrictions Precautions Precautions: Fall Precaution Comments: oxygen Restrictions Weight Bearing Restrictions: No   Pertinent Vitals/Pain HR 79, SpO2 97% on 2L with no significant changes with activity      Mobility  Bed Mobility Bed Mobility: Supine to Sit;Sitting - Scoot to Edge of Bed Supine to Sit: 4: Min assist;HOB elevated Sitting - Scoot to [place] of Bed: 4: Min assist;With  rail Details for Bed Mobility Assistance: cues for hand placement, for weight shifting; note pt sleeps in recliner all the time at home Transfers Transfers: Sit to Stand;Stand to Sit Sit to Stand: 4: Min assist;With upper extremity assist;From bed Stand to Sit: 4: Min assist;With upper extremity assist;To chair/3-in-1 Details for Transfer Assistance: cues for hand placement for anterior translation of hips and trunk extension, assist to lift hips and for safety; cues given for a more controlled descent Ambulation/Gait Ambulation/Gait Assistance: 4: Min assist Ambulation Distance (Feet): 15 Feet Assistive device: Rolling walker Ambulation/Gait Assistance Details: cues for posture, trunk placement inside RW, control and steering of RW; assist for safety Gait Pattern: Step-to pattern;Decreased stride length;Wide base of support Gait velocity: very slow General Gait Details: pt walks very slowly with trunk flexed and head down without cue; trouble steering RW (uses rollator at home) Stairs: No    Exercises     PT Diagnosis: Difficulty walking;Generalized weakness  PT Problem List: Decreased strength;Decreased activity tolerance;Decreased safety awareness PT Treatment Interventions: DME instruction;Gait training;Stair training;Functional mobility training;Therapeutic activities;Therapeutic exercise     PT Goals(Current goals can be found in the care plan section) Acute Rehab PT Goals Patient Stated Goal: to go home PT Goal Formulation: With patient Time For Goal Achievement: 04/03/13 Potential to Achieve Goals: Good  Visit Information  Last PT Received On: 03/21/13 Assistance Needed: +1 History of Present Illness: Ms. Giani Winther is a 79 year old woman with PMH of GERD, dCHF, CKD, hyperlipidemia, COPD, a-fib, PVD, hypothyroidism, diverticulosis, and multiple  episodes of C diff who presents with a one-day history of fever, chills, weakness, and vomiting x1. She woke up this morning  feeling cold and weak, and vomited once after having a few sips of water. Her daughter checked her temperature and found it was 101.4. She usually gets around with a walker, but she felt too weak to use it so her daughter had to help her into her wheelchair to come to the ED       Prior Functioning  Home Living Family/patient expects to be discharged to:: Private residence Living Arrangements: Children Available Help at Discharge: Family;Available 24 hours/day Type of Home: House Home Access: Stairs to enter [employer] of Steps: 3 Entrance Stairs-Rails: Can reach both Home Layout: One level Home Equipment: Wheelchair - [employer] - 2 wheels Additional Comments: pt sleeps in a lift chair Prior Function Level of Independence: Needs assistance Gait / Transfers Assistance Needed: RW inside and WC outside ADL's / Homemaking Assistance Needed: ADLs 50% assist for bathing and dressing Communication Communication: No difficulties    Cognition  Cognition Arousal/Alertness: Awake/alert Behavior During Therapy: WFL for tasks assessed/performed Overall Cognitive Status: Within Functional Limits for tasks assessed    Extremity/Trunk Assessment Upper Extremity Assessment Upper Extremity Assessment: Overall WFL for tasks assessed Lower Extremity Assessment Lower Extremity Assessment: Generalized weakness Cervical / Trunk Assessment Cervical / Trunk Assessment: Kyphotic   Balance    End of Session PT - End of Session Equipment Utilized During Treatment: Gait belt;Oxygen Activity Tolerance: Patient limited by fatigue Patient left: in chair;with call bell/phone within reach;with family/visitor present Nurse Communication: Mobility status  GP Functional Assessment Tool Used: clinical judgement Functional Limitation: Mobility: Walking and moving around Mobility: Walking and Moving Around Current Status (W0981): At least 20 percent but less than 40 percent impaired, limited or  restricted Mobility: Walking and Moving Around Goal Status [phone]): At least 1 percent but less than 20 percent impaired, limited or restricted   Willette Pa, SPT  03/21/2013, 1:07 PM

## 2013-03-21 NOTE — Progress Notes (Signed)
Subjective:  Pt was feeling well this AM.  She denies any CP, cough, SOB, N/V, abdominal pain.  She is unhappy that she is not able to have her morning coffee.  She is not sure about having surgery drain the possible abscess.  She is fearful of anesthesia.     Objective: Vital signs in last 24 hours: Filed Vitals:   03/21/13 0213 03/21/13 0521 03/21/13 1451 03/21/13 2056  BP: 103/51 103/60 108/52   Pulse: 86 84 78   Temp:  98.4 F (36.9 C) 98.8 F (37.1 C)   TempSrc:  Oral Oral   Resp: 18 18 17    Height:      Weight:  89.449 kg (197 lb 3.2 oz)    SpO2: 98% 98% 97% 96%   Weight change:   Intake/Output Summary (Last 24 hours) at 03/21/13 2139 Last data filed at 03/21/13 1906  Gross per 24 hour  Intake    500 ml  Output   1825 ml  Net  -1325 ml   Constitutional: Vital signs reviewed.  Patient is a well-developed and well-nourished female in no acute distress and cooperative with exam.   Head: Normocephalic and atraumatic Eyes: EOMI, No scleral icterus.  Neck: Supple, Trachea midline .  Cardiovascular: RRR, S1 normal, S2 normal, no MRG, pulses symmetric and intact bilaterally Pulmonary/Chest: normal respiratory effort, mild fine crackles at the left base Abdominal: Soft. Obese. Non-tender, non-distended, bowel sounds are normal, no masses, organomegaly, or guarding present.  Neurological: cranial nerve II-XII are grossly intact, moving all extremities without difficulty Skin: Warm, dry and intact. She has chronic venous stasis b/l on her lower extremities; no signs of infection Psychiatric: Normal mood and affect.     Lab Results: Basic Metabolic Panel:  Recent Labs Lab 03/20/13 1110 03/21/13 0512  NA 137 138  K 4.1 3.6  CL 93* 98  CO2 27 27  GLUCOSE 202* 107*  BUN 83* 71*  CREATININE 2.15* 2.09*  CALCIUM 10.1 9.3   Liver Function Tests:  Recent Labs Lab 03/20/13 1110 03/21/13 0512  AST 828* 315*  ALT 643* 415*  ALKPHOS 803* 572*  BILITOT 0.9 0.6    PROT 7.3 6.2  ALBUMIN 3.5 2.7*    Recent Labs Lab 03/20/13 1323  LIPASE 23   No results found for this basename: AMMONIA,  in the last 168 hours CBC:  Recent Labs Lab 03/20/13 1110 03/21/13 0830  WBC 11.3* 8.5  NEUTROABS 10.2*  --   HGB 12.0 10.4*  HCT 38.9 34.1*  MCV 98.7 100.3*  PLT 205 186   Cardiac Enzymes: No results found for this basename: CKTOTAL, CKMB, CKMBINDEX, TROPONINI,  in the last 168 hours BNP: No results found for this basename: PROBNP,  in the last 168 hours D-Dimer: No results found for this basename: DDIMER,  in the last 168 hours CBG: No results found for this basename: GLUCAP,  in the last 168 hours Hemoglobin A1C: No results found for this basename: HGBA1C,  in the last 168 hours Fasting Lipid Panel: No results found for this basename: CHOL, HDL, LDLCALC, TRIG, CHOLHDL, LDLDIRECT,  in the last 168 hours Thyroid Function Tests: No results found for this basename: TSH, T4TOTAL, FREET4, T3FREE, THYROIDAB,  in the last 168 hours Coagulation:  Recent Labs Lab 03/20/13 1323 03/21/13 0512  LABPROT 40.3* 20.7*  INR 4.40* 1.84*   Anemia Panel: No results found for this basename: VITAMINB12, FOLATE, FERRITIN, TIBC, IRON, RETICCTPCT,  in the last 168 hours Urine Drug  Screen: Drugs of Abuse  No results found for this basename: labopia,  cocainscrnur,  labbenz,  amphetmu,  thcu,  labbarb    Alcohol Level: No results found for this basename: ETH,  in the last 168 hours Urinalysis:  Recent Labs Lab 03/20/13 1215  COLORURINE YELLOW  LABSPEC 1.011  PHURINE 5.0  GLUCOSEU NEGATIVE  HGBUR MODERATE*  BILIRUBINUR NEGATIVE  KETONESUR NEGATIVE  PROTEINUR NEGATIVE  UROBILINOGEN 0.2  NITRITE NEGATIVE  LEUKOCYTESUR SMALL*    Micro Results: Recent Results (from the past 240 hour(s))  URINE CULTURE     Status: None   Collection Time    03/20/13 12:15 PM      Result Value Range Status   Specimen Description URINE, CATHETERIZED   Final    Special Requests NONE   Final   Culture  Setup Time     Final   Value: 03/20/2013 00:00     Performed at [facility] Count     Final   Value: NO GROWTH     Performed at [facility]   Culture     Final   Value: NO GROWTH     Performed at [facility]   Report Status 03/21/2013 FINAL   Final  MRSA PCR SCREENING     Status: Abnormal   Collection Time    03/21/13  1:17 PM      Result Value Range Status   MRSA by PCR POSITIVE (*) NEGATIVE Final   Comment:            The GeneXpert MRSA Assay (FDA     approved for NASAL specimens     only), is one component of a     comprehensive MRSA colonization     surveillance program. It is not     intended to diagnose MRSA     infection nor to guide or     monitor treatment for     MRSA infections.     RESULT CALLED TO, READ BACK BY AND VERIFIED WITH:     Windle Guard RN 15:30 03/21/13 ([email or web address])   Studies/Results: Ct Abdomen Pelvis Wo Contrast  03/20/2013   CLINICAL DATA:  Nausea.  Fever.  Fatigue.  Abdominal pain.  EXAM: CT ABDOMEN AND PELVIS WITHOUT CONTRAST  TECHNIQUE: Multidetector CT imaging of the abdomen and pelvis was performed following the standard protocol without intravenous contrast.  COMPARISON:  04/18/2011  FINDINGS: Prominent epicardial adipose tissue. Mild atelectasis in the left lower lobe. Prior bilateral pleural effusions have resolved. There is cardiomegaly along with calcification of the aortic and mitral valves, and coronary artery atherosclerotic calcification.  Prominent lateral segment left hepatic lobe. Small hypodensity in segment 3 along the falciform ligament likely represents focal fatty infiltration. Spleen and adrenal glands unremarkable in non-contrast CT appearance. Pancreas unremarkable.  Dependent abnormal density in the gallbladder may represent layering gallstones or sludge. Gallbladder moderately distended at 6 cm in diameter.  Stable 1.1 cm exophytic lesion of the posterior right  mid kidney. Bilateral renal atrophy. Vascular calcifications along the left renal hilum.  No hydronephrosis or ureteral calculus. Several small bladder cellules are present.  Appendix normal. Scattered air-fluid levels are in the nondilated proximal loops of small bowel.  Aortoiliac atherosclerotic calcification noted do the  There is a 6.6 by 3.6 by 4.0 cm abscess along the posterior margin of the sigmoid colon shown on images 63-72 of series 2. This is adjacent to the left adnexa and contains fluid, gas, and  stool like material. It may be continuous with the colon. This abscess extends back towards the left pelvic sidewall and obturator internus muscle, and is adjacent to the left external iliac vasculature. This likely represents a diverticular abscess.  Solid interbody fusion noted at L4-5. Severe loss of disc height at L3-4 with posterior osseous ridging and facet arthropathy. No lumbar spine fracture or acute subluxation.  IMPRESSION: 1. Sigmoid diverticular abscess extends posterolaterally from the sigmoid colon on the left towards the left pelvic sidewall, and contains gas, fluid, and stool like material. Uncertain whether this is connected/draining into the sigmoid colon. The extraluminal gas appears walled off and is not free within the peritoneal space. 2. Colonic diverticulosis. 3. Sludge versus cholelithiasis in the borderline distended gallbladder. 4. Atherosclerosis. 5. Prominent lateral segment left hepatic lobe, which can be an early sign of cirrhotic liver disease. 6. Bilateral renal atrophy.   Electronically Signed   By: Herbie Baltimore M.D.   On: 03/20/2013 15:57   Dg Chest 2 View  03/21/2013   CLINICAL DATA:  Pneumonia.  EXAM: CHEST  2 VIEW  COMPARISON:  03/20/2013  FINDINGS: The cardiac silhouette remains enlarged, unchanged. The patient is rotated on the lateral image. Mild retrocardiac opacity remains. The right lung remains grossly clear. No definite pleural effusion is identified. No  acute osseous abnormality is identified.  IMPRESSION: Mild retrocardiac opacity, which may reflect atelectasis. Right lung remains grossly clear.   Electronically Signed   By: Sebastian Ache   On: 03/21/2013 08:27   Dg Chest 2 View  03/20/2013   CLINICAL DATA:  Nausea and fatigue.  Fever.  EXAM: CHEST  2 VIEW  COMPARISON:  Two-view chest 04/20/2012.  FINDINGS: The heart is enlarged. Patient is rotated on the lateral view. A left pleural effusion is suspected. Left basilar airspace disease is present as well.  IMPRESSION: 1. Asymmetric left pleural effusion and airspace disease is concerning for pneumonia. 2. Stable cardiomegaly without failure.   Electronically Signed   By: Gennette Pac M.D.   On: 03/20/2013 12:23   US Abdomen Complete  03/21/2013   CLINICAL DATA:  History of abnormal liver function tests. History of hypertension and diabetes.  EXAM: ULTRASOUND ABDOMEN COMPLETE  COMPARISON:  CT 03/20/2013.  FINDINGS: Gallbladder:  Multiple small gallstones are seen in the gallbladder representing cholelithiasis. The largest calculus or clump of calculi has greatest diameter of 2.1 cm. There is some sludge within the gallbladder. No gallbladder wall thickening visualized. Gallbladder wall thickness measures 2.7 mm. No pericholecystic fluid is evident. No positive sonographic Murphy sign noted.  Common bile duct:  Diameter: 5.8 mm. No choledocholithiasis or dilatation of branching intrahepatic bile ducts is evident.  Liver:  No focal lesion identified. Within normal limits in parenchymal echogenicity. Hepatic veins appear patent. Portal vein is patent with hepatopetal flow.  IVC:  No abnormality visualized.  Pancreas:  Visualized portion unremarkable. Portions of the head and distal tail of the pancreas are obscured by bowel gas.  Spleen:  Size and appearance within normal limits.  Length is 8 cm.  Right Kidney:  Length: Right renal length is 9.7 cm. There is a tiny cyst in the upper pole with greatest  diameter of 6.7 mm. There is another cyst in the upper pole. This cyst measures 1.1 x 0.9 x 0.9 cm. There may be a partial tiny thin septation along the edge seen on sagittal image. No solid nodularity is evident. There is cortical thinning. Echogenicity within normal limits. No solid mass  or hydronephrosis visualized.  Left Kidney:  Length: Left renal length is 10.6 cm. There is a cystic mass involving the lower pole of the left kidney. This mass measures 1.2 x 1.5 x 1.4 cm. There is cortical thinning. There are few low intensity internal echoes which may reflect debris or artifact. No solid nodularity is evident. Echogenicity within normal limits. No solid mass or hydronephrosis visualized.  Abdominal aorta:  No aneurysm visualized. Maximum diameter is 2.5 cm. There is atherosclerotic plaquing. Distal portion is obscured by overlying bowel gas.  Other findings:  None.  IMPRESSION: Multiple calculi are seen within the gallbladder representing cholelithiasis. No ultrasonic evidence of cholecystitis is seen. Bile ducts appear normal. No hepatic abnormalities are identified. Small renal cysts are seen. There may be a partial tiny thin septation involving 1 cyst of the right kidney. A few internal echoes are seen involving a small cyst in the left kidney. Suggest followup ultrasound of the kidneys be obtained in approximately 6 months to assess for stability of the small cystic areas.   Electronically Signed   By: Onalee Hua  Call M.D.   On: 03/21/2013 08:31   Medications: I have reviewed the patient's current medications. Scheduled Meds: . albuterol  2.5 mg Nebulization BID  . bisoprolol  5 mg Oral Daily  . heparin subcutaneous  5,000 Units Subcutaneous Q8H  . [START ON 03/22/2013] levofloxacin (LEVAQUIN) IV  750 mg Intravenous Q48H  . levothyroxine  50 mcg Oral QAC breakfast  . metronidazole  500 mg Intravenous Q8H  . predniSONE  30 mg Oral Q breakfast   Followed by  . [START ON 03/24/2013] predniSONE  10 mg  Oral Q breakfast  . sodium chloride  3 mL Intravenous Q12H  . tiotropium  18 mcg Inhalation Daily  . torsemide  80 mg Oral Daily  . Warfarin - Pharmacist Dosing Inpatient   Does not apply q1800   Continuous Infusions:  PRN Meds:.albuterol Assessment/Plan: Principal Problem:   Colonic diverticular abscess Active Problems:   HYPOTHYROIDISM   HYPERTENSION, UNSPECIFIED   Atrial fibrillation   DIASTOLIC HEART FAILURE, CHRONIC   COPD with emphysema   Osteoarthritis   Transaminitis   Generalized weakness   Chronic kidney disease (CKD), stage IV    Diverticular Abscess Surgery, VIR and radiology in agreement that abnormalities seen on abdominal CT not suggestive of diverticular abscess (no evidence of inflammation), but rather redundant colon. Symptoms have resolved, but their original cause still not certain. May be due to pneumonia given crackles on exam and admission CXR findings, however there is no cough, chest pain, sputum production. Met 3/4 SIRS criteria in ED (Tmax 102.8, RR >20, HR >100), and has elevated lactic acid, so bacteremia should be investigated.  -IV metronidazole 500 mg q8 hrs (12/10-  -IV Levoquin 750 mg q48 hrs (12/11-  -s/p IV aziothromycin and Rocephin (03/20/13-03/20/13)  -Blood cultures x2  -C diff PCR if diarrhea recurs  -Surgery following; appreciate recommendations  Elevated liver enzymes  Resolving. All trending down today--AST 315 from 828, ALT 415 from 643, alk phos 572 from 803. Possibly due to sepsis. Possibly due to NASH (fatty infiltrate seen on abdominal CT). Possibly due to cholelithiasis, although abdominal US showed no signs of cholecystitis. Viral hepatitis is a possibility, but liver enzymes were normal in Feb 2013 making Hep B/C unlikely, and the patient has no history of travel (Hep A/E). Congestive hepatopathy is possible but no signs of CHF exacerbation (weight is stable, no evidence on CXR). Hepatic venous outflow  obstruction not seen on  abdominal CT.  -Follow on CMP  -Hold Tylenol and simvastatin   A-fib  Chronic a-fib, has failed cardioversion w/ TEE guidance in May 2010. The patient is on long-term warfarin. Supratherapeutic on admission with INR of 4.4, was in therapeutic range of 2.7 on 02/19/13. Warfarin held last night and INR now slightly sub-therapeutic at 1.84. Admitted to telemetry.  -Coumadin per pharmacy  -Daily PT/INR check   COPD  Stable. GOLD stage C on 2 liters O2 at home. Can go off of O2 while going out if she takes it easy, but can't go for extended periods without it. Has received flu vaccine this year.  -Continue home tiotropium 18 mcg inhaler daily  -Continue home albuterol 2.5 mg neb q4 hrs PRN  -2 L O2 nasal canula PRN   CKD  Baseline creatinine of 1.8-2.1 since March 2013. Creatinine down to 2.09 from 2.15 on admission. 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  -Monitor   Diastolic CHF  -Continue home bisoprolol 5 mg daily  -Torsemide 80 mg daily   Hypothyroidism  -Continue home levothyroxine 50 mcg daily    Dispo: Disposition is deferred at this time, awaiting improvement of medical problems.  Anticipated discharge in approximately 1-2 day(s).   The patient does have a current PCP (Provider Not In System) and does need an [facility] hospital follow-up appointment after discharge.  .Services Needed at time of discharge: Y = Yes, Blank = No PT:   OT:   RN:   Equipment:   Other:     LOS: 1 day   Boykin Peek, MD 03/21/2013, 9:39 PM

## 2013-03-21 NOTE — Progress Notes (Signed)
UR completed 

## 2013-03-21 NOTE — Progress Notes (Signed)
Patient ID: [name], female   DOB: 05-25-1930, 79 y.o.   MRN: [id number]    Subjective: Pt feels much better today.  Almost no abdominal tenderness at all.  No nausea.  Objective: Vital signs in last 24 hours: Temp:  [97.8 F (36.6 C)-102.8 F (39.3 C)] 98.4 F (36.9 C) (12/11 0521) Pulse Rate:  [56-139] 84 (12/11 0521) Resp:  [15-23] 18 (12/11 0521) BP: (103-144)/(48-79) 103/60 mmHg (12/11 0521) SpO2:  [91 %-99 %] 98 % (12/11 0521) Weight:  [197 lb 3.2 oz (89.449 kg)-200 lb (90.719 kg)] 197 lb 3.2 oz (89.449 kg) (12/11 0521) Last BM Date: 03/19/13  Intake/Output from previous day: 12/10 0701 - 12/11 0700 In: -  Out: 1050 [Urine:1050] Intake/Output this shift: Total I/O In: -  Out: 350 [Urine:350]  PE: Abd: soft, minimal LLQ tenderness.  No RUQ pain, +BS, obese, ND Heart: regular  Lab Results:   Recent Labs  03/20/13 1110 03/21/13 0830  WBC 11.3* 8.5  HGB 12.0 10.4*  HCT 38.9 34.1*  PLT 205 186   BMET  Recent Labs  03/20/13 1110 03/21/13 0512  NA 137 138  K 4.1 3.6  CL 93* 98  CO2 27 27  GLUCOSE 202* 107*  BUN 83* 71*  CREATININE 2.15* 2.09*  CALCIUM 10.1 9.3   PT/INR  Recent Labs  03/20/13 1323 03/21/13 0512  LABPROT 40.3* 20.7*  INR 4.40* 1.84*   CMP     Component Value Date/Time   NA 138 03/21/2013 0512   K 3.6 03/21/2013 0512   CL 98 03/21/2013 0512   CO2 27 03/21/2013 0512   GLUCOSE 107* 03/21/2013 0512   BUN 71* 03/21/2013 0512   CREATININE 2.09* 03/21/2013 0512   CREATININE 1.90* 03/23/2012 1621   CALCIUM 9.3 03/21/2013 0512   PROT 6.2 03/21/2013 0512   ALBUMIN 2.7* 03/21/2013 0512   AST 315* 03/21/2013 0512   ALT 415* 03/21/2013 0512   ALKPHOS 572* 03/21/2013 0512   BILITOT 0.6 03/21/2013 0512   GFRNONAA 21* 03/21/2013 0512   GFRAA 25* 03/21/2013 0512   Lipase     Component Value Date/Time   LIPASE 23 03/20/2013 1323       Studies/Results: Ct Abdomen Pelvis Wo Contrast  03/20/2013   CLINICAL DATA:   Nausea.  Fever.  Fatigue.  Abdominal pain.  EXAM: CT ABDOMEN AND PELVIS WITHOUT CONTRAST  TECHNIQUE: Multidetector CT imaging of the abdomen and pelvis was performed following the standard protocol without intravenous contrast.  COMPARISON:  04/18/2011  FINDINGS: Prominent epicardial adipose tissue. Mild atelectasis in the left lower lobe. Prior bilateral pleural effusions have resolved. There is cardiomegaly along with calcification of the aortic and mitral valves, and coronary artery atherosclerotic calcification.  Prominent lateral segment left hepatic lobe. Small hypodensity in segment 3 along the falciform ligament likely represents focal fatty infiltration. Spleen and adrenal glands unremarkable in non-contrast CT appearance. Pancreas unremarkable.  Dependent abnormal density in the gallbladder may represent layering gallstones or sludge. Gallbladder moderately distended at 6 cm in diameter.  Stable 1.1 cm exophytic lesion of the posterior right mid kidney. Bilateral renal atrophy. Vascular calcifications along the left renal hilum.  No hydronephrosis or ureteral calculus. Several small bladder cellules are present.  Appendix normal. Scattered air-fluid levels are in the nondilated proximal loops of small bowel.  Aortoiliac atherosclerotic calcification noted do the  There is a 6.6 by 3.6 by 4.0 cm abscess along the posterior margin of the sigmoid colon shown on images 63-72 of series 2.  This is adjacent to the left adnexa and contains fluid, gas, and stool like material. It may be continuous with the colon. This abscess extends back towards the left pelvic sidewall and obturator internus muscle, and is adjacent to the left external iliac vasculature. This likely represents a diverticular abscess.  Solid interbody fusion noted at L4-5. Severe loss of disc height at L3-4 with posterior osseous ridging and facet arthropathy. No lumbar spine fracture or acute subluxation.  IMPRESSION: 1. Sigmoid diverticular  abscess extends posterolaterally from the sigmoid colon on the left towards the left pelvic sidewall, and contains gas, fluid, and stool like material. Uncertain whether this is connected/draining into the sigmoid colon. The extraluminal gas appears walled off and is not free within the peritoneal space. 2. Colonic diverticulosis. 3. Sludge versus cholelithiasis in the borderline distended gallbladder. 4. Atherosclerosis. 5. Prominent lateral segment left hepatic lobe, which can be an early sign of cirrhotic liver disease. 6. Bilateral renal atrophy.   Electronically Signed   By: Herbie Baltimore M.D.   On: 03/20/2013 15:57   Dg Chest 2 View  03/21/2013   CLINICAL DATA:  Pneumonia.  EXAM: CHEST  2 VIEW  COMPARISON:  03/20/2013  FINDINGS: The cardiac silhouette remains enlarged, unchanged. The patient is rotated on the lateral image. Mild retrocardiac opacity remains. The right lung remains grossly clear. No definite pleural effusion is identified. No acute osseous abnormality is identified.  IMPRESSION: Mild retrocardiac opacity, which may reflect atelectasis. Right lung remains grossly clear.   Electronically Signed   By: Sebastian Ache   On: 03/21/2013 08:27   Dg Chest 2 View  03/20/2013   CLINICAL DATA:  Nausea and fatigue.  Fever.  EXAM: CHEST  2 VIEW  COMPARISON:  Two-view chest 04/20/2012.  FINDINGS: The heart is enlarged. Patient is rotated on the lateral view. A left pleural effusion is suspected. Left basilar airspace disease is present as well.  IMPRESSION: 1. Asymmetric left pleural effusion and airspace disease is concerning for pneumonia. 2. Stable cardiomegaly without failure.   Electronically Signed   By: Gennette Pac M.D.   On: 03/20/2013 12:23   US Abdomen Complete  03/21/2013   CLINICAL DATA:  History of abnormal liver function tests. History of hypertension and diabetes.  EXAM: ULTRASOUND ABDOMEN COMPLETE  COMPARISON:  CT 03/20/2013.  FINDINGS: Gallbladder:  Multiple small gallstones  are seen in the gallbladder representing cholelithiasis. The largest calculus or clump of calculi has greatest diameter of 2.1 cm. There is some sludge within the gallbladder. No gallbladder wall thickening visualized. Gallbladder wall thickness measures 2.7 mm. No pericholecystic fluid is evident. No positive sonographic Murphy sign noted.  Common bile duct:  Diameter: 5.8 mm. No choledocholithiasis or dilatation of branching intrahepatic bile ducts is evident.  Liver:  No focal lesion identified. Within normal limits in parenchymal echogenicity. Hepatic veins appear patent. Portal vein is patent with hepatopetal flow.  IVC:  No abnormality visualized.  Pancreas:  Visualized portion unremarkable. Portions of the head and distal tail of the pancreas are obscured by bowel gas.  Spleen:  Size and appearance within normal limits.  Length is 8 cm.  Right Kidney:  Length: Right renal length is 9.7 cm. There is a tiny cyst in the upper pole with greatest diameter of 6.7 mm. There is another cyst in the upper pole. This cyst measures 1.1 x 0.9 x 0.9 cm. There may be a partial tiny thin septation along the edge seen on sagittal image. No solid nodularity is  evident. There is cortical thinning. Echogenicity within normal limits. No solid mass or hydronephrosis visualized.  Left Kidney:  Length: Left renal length is 10.6 cm. There is a cystic mass involving the lower pole of the left kidney. This mass measures 1.2 x 1.5 x 1.4 cm. There is cortical thinning. There are few low intensity internal echoes which may reflect debris or artifact. No solid nodularity is evident. Echogenicity within normal limits. No solid mass or hydronephrosis visualized.  Abdominal aorta:  No aneurysm visualized. Maximum diameter is 2.5 cm. There is atherosclerotic plaquing. Distal portion is obscured by overlying bowel gas.  Other findings:  None.  IMPRESSION: Multiple calculi are seen within the gallbladder representing cholelithiasis. No  ultrasonic evidence of cholecystitis is seen. Bile ducts appear normal. No hepatic abnormalities are identified. Small renal cysts are seen. There may be a partial tiny thin septation involving 1 cyst of the right kidney. A few internal echoes are seen involving a small cyst in the left kidney. Suggest followup ultrasound of the kidneys be obtained in approximately 6 months to assess for stability of the small cystic areas.   Electronically Signed   By: Onalee Hua  Call M.D.   On: 03/21/2013 08:31    Anti-infectives: Anti-infectives   Start     Dose/Rate Route Frequency Ordered Stop   03/21/13 0800  ciprofloxacin (CIPRO) IVPB 400 mg     400 mg 200 mL/hr over 60 Minutes Intravenous Every 24 hours 03/21/13 0747     03/21/13 0745  ciprofloxacin (CIPRO) IVPB 400 mg  Status:  Discontinued     400 mg 200 mL/hr over 60 Minutes Intravenous Every 12 hours 03/21/13 0741 03/21/13 0746   03/20/13 2000  metroNIDAZOLE (FLAGYL) IVPB 500 mg     500 mg 100 mL/hr over 60 Minutes Intravenous Every 8 hours 03/20/13 1921     03/20/13 1315  azithromycin (ZITHROMAX) 500 mg in dextrose 5 % 250 mL IVPB  Status:  Discontinued     500 mg 250 mL/hr over 60 Minutes Intravenous  Once 03/20/13 1307 03/20/13 1307   03/20/13 1315  azithromycin (ZITHROMAX) 500 mg in dextrose 5 % 250 mL IVPB     500 mg 250 mL/hr over 60 Minutes Intravenous  Once 03/20/13 1307 03/20/13 1545   03/20/13 1315  cefTRIAXone (ROCEPHIN) 1 g in dextrose 5 % 50 mL IVPB     1 g 100 mL/hr over 30 Minutes Intravenous  Once 03/20/13 1308 03/20/13 1419       Assessment/Plan  1.  Diverticulitis 2. transaminitis Patient Active Problem List   Diagnosis Date Noted  . Transaminitis 03/20/2013  . Generalized weakness 03/20/2013  . Chronic kidney disease (CKD), stage IV  03/20/2013  . Colonic diverticular abscess 03/20/2013  . Oxygen dependent 07/01/2011  . Respiratory failure, chronic 07/01/2011  . Chronic gout due to renal impairment 07/01/2011  .  Physical deconditioning 05/20/2011  . Bradycardia 04/02/2011  . Venous reflux 02/14/2011  . Osteoarthritis 01/04/2011  . Fatigue 07/20/2010  . HYPERLIPIDEMIA-MIXED 03/22/2010  . HYPOTHYROIDISM 08/03/2009  . OBSTRUCTIVE SLEEP APNEA 10/07/2008  . HYPERTENSION, UNSPECIFIED 10/01/2008  . SLEEP APNEA 09/29/2008  . Peripheral vascular disease in diabetes mellitus 08/20/2008  . OVERWEIGHT/OBESITY 08/20/2008  . Atrial fibrillation 08/20/2008  . DIASTOLIC HEART FAILURE, CHRONIC 08/20/2008  . COPD with emphysema 08/20/2008  . RENAL FAILURE, CHRONIC 08/20/2008   Plan: 1. Dr. Grace Isaac and Dr. Gerrit Friends have reviewed the patient's CT scan.  They do not feel that this area of concern is an  abscess, but likely still part of the colon.  No procedures are warranted at this time.  The patient's pain is much better today.  Her WBC is normal; however, she ran a fever overnight of 102.8.  She will remain NPO x sips.  If she continues to do well and fevers come down today, then consider clears tomorrow. 2. The patient's ultrasound reveals gallstones, but no evidence for cholecystitis or ductal dilatation.  Unclear right now what the elevation is from.  The patient is completely NT in her RUQ. 3. Will follow and cont IV abx therapy  LOS: 1 day    Aza Dantes E 03/21/2013, 9:43 AM Pager: [phone]

## 2013-03-21 NOTE — Progress Notes (Signed)
Subjective: No acute overnight events. [name] feels better this morning, but is unhappy about being NPO. She was apprehensive about a possible diverticular abscess procedure, but it looks like this will not be done. She denied abdominal pain, N/V, cough, SOB, or chills.  Objective: Vital signs in last 24 hours: Filed Vitals:   03/20/13 2132 03/21/13 0213 03/21/13 0521 03/21/13 1451  BP: 119/57 103/51 103/60 108/52  Pulse: 104 86 84 78  Temp:   98.4 F (36.9 C) 98.8 F (37.1 C)  TempSrc:   Oral Oral  Resp:  18 18 17   Height:      Weight:   197 lb 3.2 oz (89.449 kg)   SpO2:  98% 98% 97%   Weight change:  Intake/Output last 3 shifts: I/O last 3 completed shifts: In: -  Out: 1050 [Urine:1050] Intake/Output this shift: Total I/O In: 0  Out: 1050 [Urine:1050] General: well-appearing obese woman in NAD HEENT: moist mucous membranes CV: irregular rhythm, regular rate Pulm: mild crackles heard in L lung base Abd: +BS, tender to deep palpation in RUQ, not tender to palpation otherwise, non-distended Ext: 1+ pitting lower extremity edema bilaterally; dry, shiny, erythematous lower extremities tender to palpation  Lab Results: Lab Results  Component Value Date   WBC 8.5 03/21/2013   RBC 3.40* 03/21/2013   HGB 10.4* 03/21/2013   HCT 34.1* 03/21/2013   MCV 100.3* 03/21/2013   MCH 30.6 03/21/2013   MCHC 30.5 03/21/2013   RDW 16.5* 03/21/2013   PLT 186 03/21/2013   Lab Results  Component Value Date   NA 138 03/21/2013   K 3.6 03/21/2013   CL 98 03/21/2013   CO2 27 03/21/2013   BUN 71* 03/21/2013   CREATININE 2.09* 03/21/2013   CALCIUM 9.3 03/21/2013   ALBUMIN 2.7* 03/21/2013   PHOS 5.5* 04/28/2011    Micro Results: Recent Results (from the past 240 hour(s))  URINE CULTURE     Status: None   Collection Time    03/20/13 12:15 PM      Result Value Range Status   Specimen Description URINE, CATHETERIZED   Final   Special Requests NONE   Final   Culture  Setup  Time     Final   Value: 03/20/2013 00:00     Performed at [facility] Count     Final   Value: NO GROWTH     Performed at [facility]   Culture     Final   Value: NO GROWTH     Performed at [facility]   Report Status 03/21/2013 FINAL   Final  MRSA PCR SCREENING     Status: Abnormal   Collection Time    03/21/13  1:17 PM      Result Value Range Status   MRSA by PCR POSITIVE (*) NEGATIVE Final   Comment:            The GeneXpert MRSA Assay (FDA     approved for NASAL specimens     only), is one component of a     comprehensive MRSA colonization     surveillance program. It is not     intended to diagnose MRSA     infection nor to guide or     monitor treatment for     MRSA infections.     RESULT CALLED TO, READ BACK BY AND VERIFIED WITH:     Windle Guard RN 15:30 03/21/13 ([email or web address])    Studies/Results: Ct  Abdomen Pelvis Wo Contrast  03/20/2013   CLINICAL DATA:  Nausea.  Fever.  Fatigue.  Abdominal pain.  EXAM: CT ABDOMEN AND PELVIS WITHOUT CONTRAST  TECHNIQUE: Multidetector CT imaging of the abdomen and pelvis was performed following the standard protocol without intravenous contrast.  COMPARISON:  04/18/2011  FINDINGS: Prominent epicardial adipose tissue. Mild atelectasis in the left lower lobe. Prior bilateral pleural effusions have resolved. There is cardiomegaly along with calcification of the aortic and mitral valves, and coronary artery atherosclerotic calcification.  Prominent lateral segment left hepatic lobe. Small hypodensity in segment 3 along the falciform ligament likely represents focal fatty infiltration. Spleen and adrenal glands unremarkable in non-contrast CT appearance. Pancreas unremarkable.  Dependent abnormal density in the gallbladder may represent layering gallstones or sludge. Gallbladder moderately distended at 6 cm in diameter.  Stable 1.1 cm exophytic lesion of the posterior right mid kidney. Bilateral renal atrophy. Vascular  calcifications along the left renal hilum.  No hydronephrosis or ureteral calculus. Several small bladder cellules are present.  Appendix normal. Scattered air-fluid levels are in the nondilated proximal loops of small bowel.  Aortoiliac atherosclerotic calcification noted do the  There is a 6.6 by 3.6 by 4.0 cm abscess along the posterior margin of the sigmoid colon shown on images 63-72 of series 2. This is adjacent to the left adnexa and contains fluid, gas, and stool like material. It may be continuous with the colon. This abscess extends back towards the left pelvic sidewall and obturator internus muscle, and is adjacent to the left external iliac vasculature. This likely represents a diverticular abscess.  Solid interbody fusion noted at L4-5. Severe loss of disc height at L3-4 with posterior osseous ridging and facet arthropathy. No lumbar spine fracture or acute subluxation.  IMPRESSION: 1. Sigmoid diverticular abscess extends posterolaterally from the sigmoid colon on the left towards the left pelvic sidewall, and contains gas, fluid, and stool like material. Uncertain whether this is connected/draining into the sigmoid colon. The extraluminal gas appears walled off and is not free within the peritoneal space. 2. Colonic diverticulosis. 3. Sludge versus cholelithiasis in the borderline distended gallbladder. 4. Atherosclerosis. 5. Prominent lateral segment left hepatic lobe, which can be an early sign of cirrhotic liver disease. 6. Bilateral renal atrophy.   Electronically Signed   By: Herbie Baltimore M.D.   On: 03/20/2013 15:57   Dg Chest 2 View  03/21/2013   CLINICAL DATA:  Pneumonia.  EXAM: CHEST  2 VIEW  COMPARISON:  03/20/2013  FINDINGS: The cardiac silhouette remains enlarged, unchanged. The patient is rotated on the lateral image. Mild retrocardiac opacity remains. The right lung remains grossly clear. No definite pleural effusion is identified. No acute osseous abnormality is identified.   IMPRESSION: Mild retrocardiac opacity, which may reflect atelectasis. Right lung remains grossly clear.   Electronically Signed   By: Sebastian Ache   On: 03/21/2013 08:27   Dg Chest 2 View  03/20/2013   CLINICAL DATA:  Nausea and fatigue.  Fever.  EXAM: CHEST  2 VIEW  COMPARISON:  Two-view chest 04/20/2012.  FINDINGS: The heart is enlarged. Patient is rotated on the lateral view. A left pleural effusion is suspected. Left basilar airspace disease is present as well.  IMPRESSION: 1. Asymmetric left pleural effusion and airspace disease is concerning for pneumonia. 2. Stable cardiomegaly without failure.   Electronically Signed   By: Gennette Pac M.D.   On: 03/20/2013 12:23   US Abdomen Complete  03/21/2013   CLINICAL DATA:  History of  abnormal liver function tests. History of hypertension and diabetes.  EXAM: ULTRASOUND ABDOMEN COMPLETE  COMPARISON:  CT 03/20/2013.  FINDINGS: Gallbladder:  Multiple small gallstones are seen in the gallbladder representing cholelithiasis. The largest calculus or clump of calculi has greatest diameter of 2.1 cm. There is some sludge within the gallbladder. No gallbladder wall thickening visualized. Gallbladder wall thickness measures 2.7 mm. No pericholecystic fluid is evident. No positive sonographic Murphy sign noted.  Common bile duct:  Diameter: 5.8 mm. No choledocholithiasis or dilatation of branching intrahepatic bile ducts is evident.  Liver:  No focal lesion identified. Within normal limits in parenchymal echogenicity. Hepatic veins appear patent. Portal vein is patent with hepatopetal flow.  IVC:  No abnormality visualized.  Pancreas:  Visualized portion unremarkable. Portions of the head and distal tail of the pancreas are obscured by bowel gas.  Spleen:  Size and appearance within normal limits.  Length is 8 cm.  Right Kidney:  Length: Right renal length is 9.7 cm. There is a tiny cyst in the upper pole with greatest diameter of 6.7 mm. There is another cyst in the  upper pole. This cyst measures 1.1 x 0.9 x 0.9 cm. There may be a partial tiny thin septation along the edge seen on sagittal image. No solid nodularity is evident. There is cortical thinning. Echogenicity within normal limits. No solid mass or hydronephrosis visualized.  Left Kidney:  Length: Left renal length is 10.6 cm. There is a cystic mass involving the lower pole of the left kidney. This mass measures 1.2 x 1.5 x 1.4 cm. There is cortical thinning. There are few low intensity internal echoes which may reflect debris or artifact. No solid nodularity is evident. Echogenicity within normal limits. No solid mass or hydronephrosis visualized.  Abdominal aorta:  No aneurysm visualized. Maximum diameter is 2.5 cm. There is atherosclerotic plaquing. Distal portion is obscured by overlying bowel gas.  Other findings:  None.  IMPRESSION: Multiple calculi are seen within the gallbladder representing cholelithiasis. No ultrasonic evidence of cholecystitis is seen. Bile ducts appear normal. No hepatic abnormalities are identified. Small renal cysts are seen. There may be a partial tiny thin septation involving 1 cyst of the right kidney. A few internal echoes are seen involving a small cyst in the left kidney. Suggest followup ultrasound of the kidneys be obtained in approximately 6 months to assess for stability of the small cystic areas.   Electronically Signed   By: Onalee Hua  Call M.D.   On: 03/21/2013 08:31    Medications: Scheduled Meds: . albuterol  2.5 mg Nebulization BID  . bisoprolol  5 mg Oral Daily  . levothyroxine  50 mcg Oral QAC breakfast  . metronidazole  500 mg Intravenous Q8H  . predniSONE  10 mg Oral Q breakfast  . sodium chloride  3 mL Intravenous Q12H  . tiotropium  18 mcg Inhalation Daily  . torsemide  80 mg Oral Daily   Continuous Infusions:  PRN Meds:.albuterol  Assessment/Plan: [name] is a 79 year old woman with PMH of GERD, dCHF, CKD, hyperlipidemia, COPD, a-fib, PVD,  hypothyroidism, diverticulosis, and multiple episodes of C diff who presents with a one-day history of generalized weakness and chills/fever of unknown origin.  Generalized weakness and chills/fever of unknown origin Surgery, VIR and radiology in agreement that abnormalities seen on abdominal CT not suggestive of diverticular abscess (no evidence of inflammation), but rather redundant colon. Symptoms have resolved, but their original cause still not certain. May be due to pneumonia given crackles  on exam and admission CXR findings, however there is no cough, chest pain, sputum production. Met 3/4 SIRS criteria in ED (Tmax 102.8, RR >20, HR >100), and has elevated lactic acid, so bacteremia should be investigated. -IV metronidazole 500 mg q8 hrs (12/10- -IV Levoquin 750 mg q48 hrs (12/11- -s/p IV aziothromycin and Rocephin (03/20/13-03/20/13) -Blood cultures x2  -C diff PCR if diarrhea recurs -Surgery following  Elevated liver enzymes  All trending down today--AST 315 from 828, ALT 415 from 643, alk phos 572 from 803. Possibly due to sepsis. Possibly due to NASH (fatty infiltrate seen on abdominal CT). Possibly due to cholelithiasis, although abdominal US showed no signs of cholecystitis. Viral hepatitis is a possibility, but liver enzymes were normal in Feb 2013 making Hep B/C unlikely, and the patient has no history of travel (Hep A/E). Congestive hepatopathy is possible but no signs of CHF exacerbation (weight is stable, no evidence on CXR). Hepatic venous outflow obstruction not seen on abdominal CT.  -Follow on CMP -Hold Tylenol and simvastatin   A-fib Chronic a-fib, has failed cardioversion w/ TEE guidance in May 2010. The patient is on long-term warfarin. Supratherapeutic on admission with INR of 4.4, was in therapeutic range of 2.7 on 02/19/13. Warfarin held last night and INR now slightly sub-therapeutic at 1.84. Admitted to telemetry. -Coumadin per pharmacy -Daily PT/INR check   COPD    GOLD stage C on 2 liters O2 at home. Can go off of O2 while going out if she takes it easy, but can't go for extended periods without it. Has received flu vaccine this year.  -Continue home tiotropium 18 mcg inhaler daily  -Continue home albuterol 2.5 mg neb q4 hrs PRN  -2 L O2 nasal canula PRN   CKD  Baseline creatinine of 1.8-2.1 since March 2013. Creatinine down to 2.09 from 2.15 on admission. 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 -Follow renal function on BMET   Diastolic CHF  -Continue home bisoprolol 5 mg daily  -Torsemide 80 mg daily   Hypothyroidism  -Continue home levothyroxine 50 mcg daily   F/E/N  -NPO overnight per surgery recs  DVT prophylaxis -Heparin 5,000 units [place] TID  This is a Psychologist, occupational Note.  The care of the patient was discussed with Dr. Delane Ginger and the assessment and plan formulated with their assistance.  Please see their attached note for official documentation of the daily encounter.   LOS: 1 day   Arn Medal, Med Student 03/21/2013, 3:31 PM

## 2013-03-21 NOTE — Evaluation (Signed)
Seen and agree with SPT note Julien Oscar Tabor Aayana Reinertsen, PT [phone]  

## 2013-03-21 NOTE — Progress Notes (Signed)
ANTICOAGULATION CONSULT NOTE - Initial Consult  Pharmacy Consult for coumadin Indication: atrial fibrillation  Allergies  Allergen Reactions  . Sulfonamide Derivatives     Childhood reaction    Patient Measurements: Height: 5\' 3"  (160 cm) Weight: 197 lb 3.2 oz (89.449 kg) (scale a) IBW/kg (Calculated) : 52.4 Heparin Dosing Weight:   Vital Signs: Temp: 98.8 F (37.1 C) (12/11 1451) Temp src: Oral (12/11 1451) BP: 108/52 mmHg (12/11 1451) Pulse Rate: 78 (12/11 1451)  Labs:  Recent Labs  03/20/13 1110 03/20/13 1323 03/21/13 0512 03/21/13 0830  HGB 12.0  --   --  10.4*  HCT 38.9  --   --  34.1*  PLT 205  --   --  186  LABPROT  --  40.3* 20.7*  --   INR  --  4.40* 1.84*  --   CREATININE 2.15*  --  2.09*  --     Estimated Creatinine Clearance: 22.8 ml/min (by C-G formula based on Cr of 2.09).   Medical History: Past Medical History  Diagnosis Date  . Diabetes mellitus   . Obesity   . COPD (chronic obstructive pulmonary disease)     Moderate. PFTs (12/10): FVC 67%, FEV1 58%, ratio 60%, TLC 95%, RV 138%, DLCO 43% (moderate obstructive defect). She is on home oxygen that should be worn at all times.  . Diastolic CHF, chronic     Echo (3/12): EF 55-60%, mild LV hypertrophy, grade I diastolic dysfunction, mild left atrial enlargement, normal pulmonary artery pressure  . CKD (chronic kidney disease)     Cr 2.4 when last checked. 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 PNA (pneumonia)     h/o with ARDS in 2005; MRSA PNA with prolonged hospitalization in [facility] 11/11  . Diverticulum     Jejunal  . OSA (obstructive sleep apnea)     possible. When pt was sedated for TEE in May 2010, she did develop some upper airway obstruction  . Anemia     of chronic disease  . Hypothyroidism   . Gout   . Atrial fibrillation 07/2008    Failed cardioversion with TEE guidance in May 2010. Pt went back to NSR by 11/2008 with amiodarne  .  Hypertension   . GERD (gastroesophageal reflux disease)   . Peripheral vascular disease   . Pneumonia   . Chronic kidney disease   . Respiratory failure, chronic 07/01/2011  . Chronic gout due to renal impairment 07/01/2011    Medications:  Scheduled:  . albuterol  2.5 mg Nebulization BID  . bisoprolol  5 mg Oral Daily  . heparin subcutaneous  5,000 Units Subcutaneous Q8H  . [START ON 03/22/2013] levofloxacin (LEVAQUIN) IV  750 mg Intravenous Q48H  . levothyroxine  50 mcg Oral QAC breakfast  . metronidazole  500 mg Intravenous Q8H  . predniSONE  30 mg Oral Q breakfast   Followed by  . [START ON 03/24/2013] predniSONE  10 mg Oral Q breakfast  . sodium chloride  3 mL Intravenous Q12H  . tiotropium  18 mcg Inhalation Daily  . torsemide  80 mg Oral Daily   Infusions:    Assessment: 79 yo female with hx of afib will be resumed on coumadin since surgery is unlikely.  Patient was on 2mg  MWF and 3mg  other days prior to admission.  Last dose was 03/19/13.  She is currently on flagyl.  Admitting INR was 4.4. Goal of Therapy:  INR 2-3 Monitor platelets by anticoagulation protocol: Yes  Plan:  1) Coumadin 2 mg po x1 2) INR in am  Uthman Mroczkowski, Tsz-Yin 03/21/2013,4:27 PM

## 2013-03-21 NOTE — H&P (Signed)
  Date: 03/21/2013  Patient name: [name]  Medical record number: [id number]  Date of birth: 14-Jul-1930   I have seen and evaluated [name] and discussed their care with the Residency Team. [name] was admitted with fever, leukocytosis, weakness, AMS, and increased LFT's. CT was concerning for sigmoid abscess but IR, rad, and surgery reviewed and feel it is thick wall divertic. She has normalized her WBC, bc afebrile, ABD pain resolved, LFT's decreasing, and AMS normalized with IVF and ABX. There was also ? CAP on CXR but pt without pul sxs.  Assessment and Plan: I have seen and evaluated the patient as outlined above. I agree with the formulated Assessment and Plan as detailed in the residents' admission note, with the following changes:   1. Severe sepsis - tx underlying causes. See #2 and #3  2. Diverticulitis - Levaquin and levaquin. Pt has very quickly responded to tx.  3. ? CAP - change Cipro to levaquin for pul coverage.   4. Increased LFT's - likely 2/2 sepsis. Trending down  5. A Fib - resume warfarin since no surgical procedures planned.   Heidi Spain, MD 12/11/20144:54 PM

## 2013-03-21 NOTE — Progress Notes (Signed)
General Surgery [facility]  Patient seen and examined.  Up on bedside commode.  Family at bedside.  CT scan reviewed with Dr. Grace Isaac for IR.  Suspect findings on CT scan consistent with large, thick walled diverticulum of sigmoid colon and not a diverticular abscess.  Clinically does not appear to have an abscess.  To definitively evaluate area, would consider gastrograffin enema or limited pelvic CT scan with rectal contrast.  For now, would hold off on further studies and monitor clinical progress.  USN shows gallstones but no inflammatory changes.  Patient denies any abdominal pain or tenderness at time of my exam this afternoon.  Encouraged OOB, ambulation with assistance.  Will follow.  Velora Heckler, MD, [facility] Office: [phone]

## 2013-03-22 DIAGNOSIS — N189 Chronic kidney disease, unspecified: Secondary | ICD-10-CM

## 2013-03-22 DIAGNOSIS — J449 Chronic obstructive pulmonary disease, unspecified: Secondary | ICD-10-CM

## 2013-03-22 DIAGNOSIS — I4891 Unspecified atrial fibrillation: Secondary | ICD-10-CM

## 2013-03-22 DIAGNOSIS — J4489 Other specified chronic obstructive pulmonary disease: Secondary | ICD-10-CM

## 2013-03-22 DIAGNOSIS — E039 Hypothyroidism, unspecified: Secondary | ICD-10-CM

## 2013-03-22 DIAGNOSIS — I5032 Chronic diastolic (congestive) heart failure: Secondary | ICD-10-CM

## 2013-03-22 DIAGNOSIS — R748 Abnormal levels of other serum enzymes: Secondary | ICD-10-CM

## 2013-03-22 LAB — CBC
HCT: 35.7 % — ABNORMAL LOW (ref 36.0–46.0)
MCHC: 29.4 g/dL — ABNORMAL LOW (ref 30.0–36.0)
RDW: 16.1 % — ABNORMAL HIGH (ref 11.5–15.5)
WBC: 9.3 10*3/uL (ref 4.0–10.5)

## 2013-03-22 LAB — COMPREHENSIVE METABOLIC PANEL
ALT: 282 U/L — ABNORMAL HIGH (ref 0–35)
AST: 87 U/L — ABNORMAL HIGH (ref 0–37)
Albumin: 2.8 g/dL — ABNORMAL LOW (ref 3.5–5.2)
Alkaline Phosphatase: 482 U/L — ABNORMAL HIGH (ref 39–117)
BUN: 78 mg/dL — ABNORMAL HIGH (ref 6–23)
Chloride: 96 mEq/L (ref 96–112)
Potassium: 4.4 mEq/L (ref 3.5–5.1)
Sodium: 139 mEq/L (ref 135–145)
Total Bilirubin: 0.3 mg/dL (ref 0.3–1.2)
Total Protein: 6.5 g/dL (ref 6.0–8.3)

## 2013-03-22 LAB — PROTIME-INR
INR: 1.33 (ref 0.00–1.49)
Prothrombin Time: 16.2 seconds — ABNORMAL HIGH (ref 11.6–15.2)

## 2013-03-22 MED ORDER — LEVOFLOXACIN 500 MG PO TABS
500.0000 mg | ORAL_TABLET | ORAL | Status: DC
  Administered 2013-03-24: 11:00:00 500 mg via ORAL
  Filled 2013-03-22: qty 1

## 2013-03-22 MED ORDER — WARFARIN SODIUM 2 MG PO TABS
2.0000 mg | ORAL_TABLET | Freq: Once | ORAL | Status: AC
  Administered 2013-03-22: 17:00:00 2 mg via ORAL
  Filled 2013-03-22: qty 1

## 2013-03-22 MED ORDER — METRONIDAZOLE 500 MG PO TABS
500.0000 mg | ORAL_TABLET | Freq: Three times a day (TID) | ORAL | Status: DC
  Administered 2013-03-22 – 2013-03-24 (×7): 500 mg via ORAL
  Filled 2013-03-22 (×9): qty 1

## 2013-03-22 MED ORDER — LEVOFLOXACIN 750 MG PO TABS: 750.0000 mg | ORAL_TABLET | ORAL | Status: DC

## 2013-03-22 NOTE — Progress Notes (Signed)
Patient ID: KAZZANDRA DESAULNIERS, female   DOB: 1931-01-09, 79 y.o.   MRN: [id number] Internal Medicine Attending  Date: 03/22/2013  Patient name: [name] Medical record number: [id number] Date of birth: [birth year] Age: 79 y.o. Gender: female  I saw and evaluated the patient on a.m. rounds with house staff. I reviewed the resident's note by Dr. Delane Ginger and I agree with the resident's findings and plans as documented in her note.

## 2013-03-22 NOTE — Progress Notes (Signed)
General Surgery [facility]  Discussed with Dr. Delane Ginger.  Agree with treating suspected infection with full 14 day course of abx.  Would not obtain any further diagnostic studies at this time unless patient clinically deteriorates.  Will sign off.  Call if needed.  Velora Heckler, MD, [facility] Office: [phone]

## 2013-03-22 NOTE — Progress Notes (Signed)
Subjective: No acute overnight events. [name] is feeling well this morning. She is eager to get off NPO and start a clear liquid diet. She has not had any alcohol for over a week, so disulfiram-like reaction to metronidazole not cause of abnormal LFTs. She denies fever, chills, abdominal pain, N/V, SOB, or cough.  Objective: Vital signs in last 24 hours: Filed Vitals:   03/21/13 2056 03/21/13 2100 03/22/13 0700 03/22/13 0843  BP:  119/85 122/79   Pulse:  71 76 70  Temp:  97.5 F (36.4 C) 98.1 F (36.7 C)   TempSrc:  Oral Oral   Resp:  19 19   Height:      Weight:   185 lb 6.4 oz (84.097 kg)   SpO2: 96% 97% 96% 96%   Weight change: -14 lb 9.6 oz (-6.623 kg) Intake/Output last 3 shifts: I/O last 3 completed shifts: In: 500 [IV Piggyback:500] Out: 1825 [Urine:1825] Intake/Output this shift:   General: well-appearing woman in NAD HEENT: no scleral icterus, moist mucous membranes CV: reg rate, irreg rhythm, no murmurs auscultated Pulm: CTAB Abd: +BS, non-tender to deep palpation in RUQ, LLQ, LUQ; tender to deep palpation in RLQ, neg Murphy's sign Ext: chronic venous stasis in lower extremities bilaterally, but no difference from baseline; 1+ lower extremity pitting edema and mild tenderness  Lab Results: Lab Results  Component Value Date   WBC 9.3 03/22/2013   RBC 3.55* 03/22/2013   HGB 10.5* 03/22/2013   HCT 35.7* 03/22/2013   MCV 100.6* 03/22/2013   MCH 29.6 03/22/2013   MCHC 29.4* 03/22/2013   RDW 16.1* 03/22/2013   PLT 186 03/22/2013   Lab Results  Component Value Date   NA 139 03/22/2013   K 4.4 03/22/2013   CL 96 03/22/2013   CO2 27 03/22/2013   BUN 78* 03/22/2013   CREATININE 2.21* 03/22/2013   CALCIUM 9.2 03/22/2013   ALBUMIN 2.8* 03/22/2013   PHOS 5.5* 04/28/2011    Micro Results: Recent Results (from the past 240 hour(s))  URINE CULTURE     Status: None   Collection Time    03/20/13 12:15 PM      Result Value Range Status   Specimen  Description URINE, CATHETERIZED   Final   Special Requests NONE   Final   Culture  Setup Time     Final   Value: 03/20/2013 00:00     Performed at [facility] Count     Final   Value: NO GROWTH     Performed at [facility]   Culture     Final   Value: NO GROWTH     Performed at [facility]   Report Status 03/21/2013 FINAL   Final  CULTURE, BLOOD (ROUTINE X 2)     Status: None   Collection Time    03/20/13  6:15 PM      Result Value Range Status   Specimen Description BLOOD HAND RIGHT   Final   Special Requests BOTTLES DRAWN AEROBIC ONLY 10CC   Final   Culture  Setup Time     Final   Value: 03/21/2013 00:06     Performed at [facility]   Culture     Final   Value:        BLOOD CULTURE RECEIVED NO GROWTH TO DATE CULTURE WILL BE HELD FOR 5 DAYS BEFORE ISSUING A FINAL NEGATIVE REPORT     Performed at [facility]   Report  Status PENDING   Incomplete  CULTURE, BLOOD (ROUTINE X 2)     Status: None   Collection Time    03/20/13  6:20 PM      Result Value Range Status   Specimen Description BLOOD ARM LEFT   Final   Special Requests BOTTLES DRAWN AEROBIC AND ANAEROBIC 10CC   Final   Culture  Setup Time     Final   Value: 03/21/2013 00:05     Performed at [facility]   Culture     Final   Value:        BLOOD CULTURE RECEIVED NO GROWTH TO DATE CULTURE WILL BE HELD FOR 5 DAYS BEFORE ISSUING A FINAL NEGATIVE REPORT     Performed at [facility]   Report Status PENDING   Incomplete  MRSA PCR SCREENING     Status: Abnormal   Collection Time    03/21/13  1:17 PM      Result Value Range Status   MRSA by PCR POSITIVE (*) NEGATIVE Final   Comment:            The GeneXpert MRSA Assay (FDA     approved for NASAL specimens     only), is one component of a     comprehensive MRSA colonization     surveillance program. It is not     intended to diagnose MRSA     infection nor to guide or     monitor treatment for      MRSA infections.     RESULT CALLED TO, READ BACK BY AND VERIFIED WITH:     Windle Guard RN 15:30 03/21/13 ([email or web address])    Studies/Results: Ct Abdomen Pelvis Wo Contrast  03/20/2013   CLINICAL DATA:  Nausea.  Fever.  Fatigue.  Abdominal pain.  EXAM: CT ABDOMEN AND PELVIS WITHOUT CONTRAST  TECHNIQUE: Multidetector CT imaging of the abdomen and pelvis was performed following the standard protocol without intravenous contrast.  COMPARISON:  04/18/2011  FINDINGS: Prominent epicardial adipose tissue. Mild atelectasis in the left lower lobe. Prior bilateral pleural effusions have resolved. There is cardiomegaly along with calcification of the aortic and mitral valves, and coronary artery atherosclerotic calcification.  Prominent lateral segment left hepatic lobe. Small hypodensity in segment 3 along the falciform ligament likely represents focal fatty infiltration. Spleen and adrenal glands unremarkable in non-contrast CT appearance. Pancreas unremarkable.  Dependent abnormal density in the gallbladder may represent layering gallstones or sludge. Gallbladder moderately distended at 6 cm in diameter.  Stable 1.1 cm exophytic lesion of the posterior right mid kidney. Bilateral renal atrophy. Vascular calcifications along the left renal hilum.  No hydronephrosis or ureteral calculus. Several small bladder cellules are present.  Appendix normal. Scattered air-fluid levels are in the nondilated proximal loops of small bowel.  Aortoiliac atherosclerotic calcification noted do the  There is a 6.6 by 3.6 by 4.0 cm abscess along the posterior margin of the sigmoid colon shown on images 63-72 of series 2. This is adjacent to the left adnexa and contains fluid, gas, and stool like material. It may be continuous with the colon. This abscess extends back towards the left pelvic sidewall and obturator internus muscle, and is adjacent to the left external iliac vasculature. This likely represents a diverticular abscess.  Solid interbody  fusion noted at L4-5. Severe loss of disc height at L3-4 with posterior osseous ridging and facet arthropathy. No lumbar spine fracture or acute subluxation.  IMPRESSION: 1. Sigmoid diverticular abscess extends posterolaterally from  the sigmoid colon on the left towards the left pelvic sidewall, and contains gas, fluid, and stool like material. Uncertain whether this is connected/draining into the sigmoid colon. The extraluminal gas appears walled off and is not free within the peritoneal space. 2. Colonic diverticulosis. 3. Sludge versus cholelithiasis in the borderline distended gallbladder. 4. Atherosclerosis. 5. Prominent lateral segment left hepatic lobe, which can be an early sign of cirrhotic liver disease. 6. Bilateral renal atrophy.   Electronically Signed   By: Herbie Baltimore M.D.   On: 03/20/2013 15:57   Dg Chest 2 View  03/21/2013   CLINICAL DATA:  Pneumonia.  EXAM: CHEST  2 VIEW  COMPARISON:  03/20/2013  FINDINGS: The cardiac silhouette remains enlarged, unchanged. The patient is rotated on the lateral image. Mild retrocardiac opacity remains. The right lung remains grossly clear. No definite pleural effusion is identified. No acute osseous abnormality is identified.  IMPRESSION: Mild retrocardiac opacity, which may reflect atelectasis. Right lung remains grossly clear.   Electronically Signed   By: Sebastian Ache   On: 03/21/2013 08:27   US Abdomen Complete  03/21/2013   CLINICAL DATA:  History of abnormal liver function tests. History of hypertension and diabetes.  EXAM: ULTRASOUND ABDOMEN COMPLETE  COMPARISON:  CT 03/20/2013.  FINDINGS: Gallbladder:  Multiple small gallstones are seen in the gallbladder representing cholelithiasis. The largest calculus or clump of calculi has greatest diameter of 2.1 cm. There is some sludge within the gallbladder. No gallbladder wall thickening visualized. Gallbladder wall thickness measures 2.7 mm. No pericholecystic fluid is evident. No positive  sonographic Murphy sign noted.  Common bile duct:  Diameter: 5.8 mm. No choledocholithiasis or dilatation of branching intrahepatic bile ducts is evident.  Liver:  No focal lesion identified. Within normal limits in parenchymal echogenicity. Hepatic veins appear patent. Portal vein is patent with hepatopetal flow.  IVC:  No abnormality visualized.  Pancreas:  Visualized portion unremarkable. Portions of the head and distal tail of the pancreas are obscured by bowel gas.  Spleen:  Size and appearance within normal limits.  Length is 8 cm.  Right Kidney:  Length: Right renal length is 9.7 cm. There is a tiny cyst in the upper pole with greatest diameter of 6.7 mm. There is another cyst in the upper pole. This cyst measures 1.1 x 0.9 x 0.9 cm. There may be a partial tiny thin septation along the edge seen on sagittal image. No solid nodularity is evident. There is cortical thinning. Echogenicity within normal limits. No solid mass or hydronephrosis visualized.  Left Kidney:  Length: Left renal length is 10.6 cm. There is a cystic mass involving the lower pole of the left kidney. This mass measures 1.2 x 1.5 x 1.4 cm. There is cortical thinning. There are few low intensity internal echoes which may reflect debris or artifact. No solid nodularity is evident. Echogenicity within normal limits. No solid mass or hydronephrosis visualized.  Abdominal aorta:  No aneurysm visualized. Maximum diameter is 2.5 cm. There is atherosclerotic plaquing. Distal portion is obscured by overlying bowel gas.  Other findings:  None.  IMPRESSION: Multiple calculi are seen within the gallbladder representing cholelithiasis. No ultrasonic evidence of cholecystitis is seen. Bile ducts appear normal. No hepatic abnormalities are identified. Small renal cysts are seen. There may be a partial tiny thin septation involving 1 cyst of the right kidney. A few internal echoes are seen involving a small cyst in the left kidney. Suggest followup  ultrasound of the kidneys be obtained in  approximately 6 months to assess for stability of the small cystic areas.   Electronically Signed   By: Onalee Hua  Call M.D.   On: 03/21/2013 08:31    Medications: Scheduled Meds: . albuterol  2.5 mg Nebulization BID  . bisoprolol  5 mg Oral Daily  . heparin subcutaneous  5,000 Units Subcutaneous Q8H  . levofloxacin (LEVAQUIN) IV  750 mg Intravenous Q48H  . levothyroxine  50 mcg Oral QAC breakfast  . metronidazole  500 mg Intravenous Q8H  . predniSONE  30 mg Oral Q breakfast   Followed by  . [START ON 03/24/2013] predniSONE  10 mg Oral Q breakfast  . sodium chloride  3 mL Intravenous Q12H  . tiotropium  18 mcg Inhalation Daily  . warfarin  2 mg Oral ONCE-1800  . Warfarin - Pharmacist Dosing Inpatient   Does not apply q1800   Continuous Infusions:  PRN Meds:.albuterol  Assessment/Plan: [name] is a 79 year old woman with PMH of GERD, dCHF, CKD, hyperlipidemia, COPD, a-fib, PVD, hypothyroidism, diverticulosis, and multiple episodes of C diff who presents with a one-day history of generalized weakness and chills/fever of unknown origin.   Questionable diverticular abscess per abdominal CT on admission Surgery, VIR and radiology in agreement that abnormalities seen on abdominal CT not suggestive of diverticular abscess (no evidence of inflammation), but rather redundant colon. Talked with surgery today and they don't think further imaging is needed to investigate this since patient is clinically stable and asymptomatic. May be due to pneumonia given crackles on admission and CXR findings, however there is no cough, chest pain, sputum production. Lungs clear to auscultation today. -IV metronidazole 500 mg q8 hrs (12/10- -IV Levoquin 750 mg q48 hrs (12/11-12/13). Will switch to oral Levoquin 750 mg q48 hrs on 12/14 -s/p IV aziothromycin and Rocephin (03/20/13-03/20/13)  -Blood cultures x2 NGTD -Surgery following (see paragraph  above)  Elevated liver enzymes Continue to trend down--AST 87 from 315, ALT 282 from 415, alk phos 482 from 572. Possibly due to sepsis. Possibly due to NASH (fatty infiltrate seen on abdominal CT).  Viral hepatitis is a possibility, but liver enzymes were normal in Feb 2013 making Hep B/C unlikely, and the patient has no history of travel (Hep A/E). Congestive hepatopathy is possible but no signs of CHF exacerbation (weight is stable, no evidence on CXR). Hepatic venous outflow obstruction not seen on abdominal CT.  -Follow on CMP  -Hold Tylenol and simvastatin   A-fib  Chronic a-fib, has failed cardioversion w/ TEE guidance in May 2010. The patient is on long-term warfarin. Supratherapeutic on admission with INR of 4.4, was in therapeutic range of 2.7 on 02/19/13. Warfarin held on night of admission, but re-started yesterday (12/11). INR 1.34 today. Admitted to telemetry.  -Warfarin per pharmacy  -Daily PT/INR check   COPD  GOLD stage C on 2 liters O2 at home. Can go off of O2 while going out if she takes it easy, but can't go for extended periods without it. Has received flu vaccine this year.  -Continue home tiotropium 18 mcg inhaler daily  -Continue home albuterol 2.5 mg neb q4 hrs PRN -Prednisone 40 mg daily (stress dose) -2 L O2 nasal canula PRN   CKD  Baseline creatinine of 1.8-2.1 since March 2013. Creatinine down to 2.09 from 2.15 on admission. 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  -Follow renal function on BMET   Diastolic CHF  -Continue home bisoprolol 5 mg daily  -Torsemide 80 mg daily   Hypothyroidism  -  Continue home levothyroxine 50 mcg daily   F/E/N  -NPO overnight per surgery recs   DVT prophylaxis  -Heparin 5,000 units [place] TID   This is a Psychologist, occupational Note.  The care of the patient was discussed with Dr. Delane Ginger and the assessment and plan formulated with their assistance.  Please see their attached note for official documentation  of the daily encounter.   LOS: 2 days   Arn Medal, Med Student 03/22/2013, 2:44 PM

## 2013-03-22 NOTE — Progress Notes (Signed)
Subjective:  Pt was feeling well this AM.  She denies any CP, cough, SOB, N/V, abdominal pain.     Objective: Vital signs in last 24 hours: Filed Vitals:   03/21/13 2100 03/22/13 0700 03/22/13 0843 03/22/13 1452  BP: 119/85 122/79  136/63  Pulse: 71 76 70 68  Temp: 97.5 F (36.4 C) 98.1 F (36.7 C)  97.8 F (36.6 C)  TempSrc: Oral Oral  Oral  Resp: 19 19  18   Height:      Weight:  84.097 kg (185 lb 6.4 oz)    SpO2: 97% 96% 96% 98%   Weight change: -6.623 kg (-14 lb 9.6 oz)  Intake/Output Summary (Last 24 hours) at 03/22/13 1518 Last data filed at 03/22/13 1450  Gross per 24 hour  Intake    720 ml  Output    375 ml  Net    345 ml   Constitutional: Vital signs reviewed.  Patient is a well-developed and well-nourished female in no acute distress and cooperative with exam.   Head: Normocephalic and atraumatic Eyes: EOMI, No scleral icterus.  Neck: Supple, Trachea midline .  Cardiovascular: RRR, S1 normal, S2 normal, no MRG, pulses symmetric and intact bilaterally Pulmonary/Chest: normal respiratory effort, mild fine crackles at the left base Abdominal: Soft. Obese. Non-tender, non-distended, bowel sounds are normal, no masses, organomegaly, or guarding present.  Neurological: cranial nerve II-XII are grossly intact, moving all extremities without difficulty Skin: Warm, dry and intact. She has chronic venous stasis b/l on her lower extremities; no signs of infection Psychiatric: Normal mood and affect.     Lab Results: Basic Metabolic Panel:  Recent Labs Lab 03/21/13 0512 03/22/13 0542  NA 138 139  K 3.6 4.4  CL 98 96  CO2 27 27  GLUCOSE 107* 233*  BUN 71* 78*  CREATININE 2.09* 2.21*  CALCIUM 9.3 9.2   Liver Function Tests:  Recent Labs Lab 03/21/13 0512 03/22/13 0542  AST 315* 87*  ALT 415* 282*  ALKPHOS 572* 482*  BILITOT 0.6 0.3  PROT 6.2 6.5  ALBUMIN 2.7* 2.8*    Recent Labs Lab 03/20/13 1323  LIPASE 23   No results found for this  basename: AMMONIA,  in the last 168 hours CBC:  Recent Labs Lab 03/20/13 1110 03/21/13 0830 03/22/13 0542  WBC 11.3* 8.5 9.3  NEUTROABS 10.2*  --   --   HGB 12.0 10.4* 10.5*  HCT 38.9 34.1* 35.7*  MCV 98.7 100.3* 100.6*  PLT 205 186 186   Cardiac Enzymes: No results found for this basename: CKTOTAL, CKMB, CKMBINDEX, TROPONINI,  in the last 168 hours BNP: No results found for this basename: PROBNP,  in the last 168 hours D-Dimer: No results found for this basename: DDIMER,  in the last 168 hours CBG:  Recent Labs Lab 03/22/13 0817  GLUCAP 214*   Hemoglobin A1C: No results found for this basename: HGBA1C,  in the last 168 hours Fasting Lipid Panel: No results found for this basename: CHOL, HDL, LDLCALC, TRIG, CHOLHDL, LDLDIRECT,  in the last 168 hours Thyroid Function Tests: No results found for this basename: TSH, T4TOTAL, FREET4, T3FREE, THYROIDAB,  in the last 168 hours Coagulation:  Recent Labs Lab 03/20/13 1323 03/21/13 0512 03/22/13 0542  LABPROT 40.3* 20.7* 16.2*  INR 4.40* 1.84* 1.33   Anemia Panel: No results found for this basename: VITAMINB12, FOLATE, FERRITIN, TIBC, IRON, RETICCTPCT,  in the last 168 hours Urine Drug Screen: Drugs of Abuse  No results found for  this basename: labopia,  cocainscrnur,  labbenz,  amphetmu,  thcu,  labbarb    Alcohol Level: No results found for this basename: ETH,  in the last 168 hours Urinalysis:  Recent Labs Lab 03/20/13 1215  COLORURINE YELLOW  LABSPEC 1.011  PHURINE 5.0  GLUCOSEU NEGATIVE  HGBUR MODERATE*  BILIRUBINUR NEGATIVE  KETONESUR NEGATIVE  PROTEINUR NEGATIVE  UROBILINOGEN 0.2  NITRITE NEGATIVE  LEUKOCYTESUR SMALL*    Micro Results: Recent Results (from the past 240 hour(s))  URINE CULTURE     Status: None   Collection Time    03/20/13 12:15 PM      Result Value Range Status   Specimen Description URINE, CATHETERIZED   Final   Special Requests NONE   Final   Culture  Setup Time     Final     Value: 03/20/2013 00:00     Performed at [facility] Count     Final   Value: NO GROWTH     Performed at [facility]   Culture     Final   Value: NO GROWTH     Performed at [facility]   Report Status 03/21/2013 FINAL   Final  CULTURE, BLOOD (ROUTINE X 2)     Status: None   Collection Time    03/20/13  6:15 PM      Result Value Range Status   Specimen Description BLOOD HAND RIGHT   Final   Special Requests BOTTLES DRAWN AEROBIC ONLY 10CC   Final   Culture  Setup Time     Final   Value: 03/21/2013 00:06     Performed at [facility]   Culture     Final   Value:        BLOOD CULTURE RECEIVED NO GROWTH TO DATE CULTURE WILL BE HELD FOR 5 DAYS BEFORE ISSUING A FINAL NEGATIVE REPORT     Performed at [facility]   Report Status PENDING   Incomplete  CULTURE, BLOOD (ROUTINE X 2)     Status: None   Collection Time    03/20/13  6:20 PM      Result Value Range Status   Specimen Description BLOOD ARM LEFT   Final   Special Requests BOTTLES DRAWN AEROBIC AND ANAEROBIC 10CC   Final   Culture  Setup Time     Final   Value: 03/21/2013 00:05     Performed at [facility]   Culture     Final   Value:        BLOOD CULTURE RECEIVED NO GROWTH TO DATE CULTURE WILL BE HELD FOR 5 DAYS BEFORE ISSUING A FINAL NEGATIVE REPORT     Performed at [facility]   Report Status PENDING   Incomplete  MRSA PCR SCREENING     Status: Abnormal   Collection Time    03/21/13  1:17 PM      Result Value Range Status   MRSA by PCR POSITIVE (*) NEGATIVE Final   Comment:            The GeneXpert MRSA Assay (FDA     approved for NASAL specimens     only), is one component of a     comprehensive MRSA colonization     surveillance program. It is not     intended to diagnose MRSA     infection nor to guide or     monitor treatment for     MRSA infections.  RESULT CALLED TO, READ BACK BY AND VERIFIED WITH:     Windle Guard RN 15:30 03/21/13  ([email or web address])   Studies/Results: Dg Chest 2 View  03/21/2013   CLINICAL DATA:  Pneumonia.  EXAM: CHEST  2 VIEW  COMPARISON:  03/20/2013  FINDINGS: The cardiac silhouette remains enlarged, unchanged. The patient is rotated on the lateral image. Mild retrocardiac opacity remains. The right lung remains grossly clear. No definite pleural effusion is identified. No acute osseous abnormality is identified.  IMPRESSION: Mild retrocardiac opacity, which may reflect atelectasis. Right lung remains grossly clear.   Electronically Signed   By: Sebastian Ache   On: 03/21/2013 08:27   US Abdomen Complete  03/21/2013   CLINICAL DATA:  History of abnormal liver function tests. History of hypertension and diabetes.  EXAM: ULTRASOUND ABDOMEN COMPLETE  COMPARISON:  CT 03/20/2013.  FINDINGS: Gallbladder:  Multiple small gallstones are seen in the gallbladder representing cholelithiasis. The largest calculus or clump of calculi has greatest diameter of 2.1 cm. There is some sludge within the gallbladder. No gallbladder wall thickening visualized. Gallbladder wall thickness measures 2.7 mm. No pericholecystic fluid is evident. No positive sonographic Murphy sign noted.  Common bile duct:  Diameter: 5.8 mm. No choledocholithiasis or dilatation of branching intrahepatic bile ducts is evident.  Liver:  No focal lesion identified. Within normal limits in parenchymal echogenicity. Hepatic veins appear patent. Portal vein is patent with hepatopetal flow.  IVC:  No abnormality visualized.  Pancreas:  Visualized portion unremarkable. Portions of the head and distal tail of the pancreas are obscured by bowel gas.  Spleen:  Size and appearance within normal limits.  Length is 8 cm.  Right Kidney:  Length: Right renal length is 9.7 cm. There is a tiny cyst in the upper pole with greatest diameter of 6.7 mm. There is another cyst in the upper pole. This cyst measures 1.1 x 0.9 x 0.9 cm. There may be a partial tiny thin septation along the edge  seen on sagittal image. No solid nodularity is evident. There is cortical thinning. Echogenicity within normal limits. No solid mass or hydronephrosis visualized.  Left Kidney:  Length: Left renal length is 10.6 cm. There is a cystic mass involving the lower pole of the left kidney. This mass measures 1.2 x 1.5 x 1.4 cm. There is cortical thinning. There are few low intensity internal echoes which may reflect debris or artifact. No solid nodularity is evident. Echogenicity within normal limits. No solid mass or hydronephrosis visualized.  Abdominal aorta:  No aneurysm visualized. Maximum diameter is 2.5 cm. There is atherosclerotic plaquing. Distal portion is obscured by overlying bowel gas.  Other findings:  None.  IMPRESSION: Multiple calculi are seen within the gallbladder representing cholelithiasis. No ultrasonic evidence of cholecystitis is seen. Bile ducts appear normal. No hepatic abnormalities are identified. Small renal cysts are seen. There may be a partial tiny thin septation involving 1 cyst of the right kidney. A few internal echoes are seen involving a small cyst in the left kidney. Suggest followup ultrasound of the kidneys be obtained in approximately 6 months to assess for stability of the small cystic areas.   Electronically Signed   By: Onalee Hua  Call M.D.   On: 03/21/2013 08:31   Medications: I have reviewed the patient's current medications. Scheduled Meds: . albuterol  2.5 mg Nebulization BID  . bisoprolol  5 mg Oral Daily  . heparin subcutaneous  5,000 Units Subcutaneous Q8H  . [START ON 03/24/2013] levofloxacin  750 mg  Oral Q48H  . levothyroxine  50 mcg Oral QAC breakfast  . metroNIDAZOLE  500 mg Oral Q8H  . predniSONE  30 mg Oral Q breakfast   Followed by  . [START ON 03/24/2013] predniSONE  10 mg Oral Q breakfast  . sodium chloride  3 mL Intravenous Q12H  . tiotropium  18 mcg Inhalation Daily  . warfarin  2 mg Oral ONCE-1800  . Warfarin - Pharmacist Dosing Inpatient   Does not  apply q1800   Continuous Infusions:  PRN Meds:.albuterol Assessment/Plan: Principal Problem:   Colonic diverticular abscess Active Problems:   HYPOTHYROIDISM   HYPERTENSION, UNSPECIFIED   Atrial fibrillation   DIASTOLIC HEART FAILURE, CHRONIC   COPD with emphysema   Osteoarthritis   Transaminitis   Generalized weakness   Chronic kidney disease (CKD), stage IV    Possible Diverticular Abscess per CT imaging  Surgery, VIR and radiology in agreement that abnormalities seen on abdominal CT not suggestive of diverticular abscess (no evidence of inflammation), but rather redundant colon. Symptoms have resolved, but their original cause still not certain. May be due to pneumonia given crackles on exam and admission CXR findings, however there is no cough, chest pain, sputum production. Met 3/4 SIRS criteria in ED (Tmax 102.8, RR >20, HR >100), and has elevated lactic acid.  We started IV metronidazole 500 mg q8 hrs on 12/10 and IV Levoquin 750 mg q48 hrs on 12/11 (d/t renal dosing).  She received IV aziothromycin and Rocephin (03/20/13-03/20/13) in the ED.  BC have been NGTD.  Spoke with Dr. Gerrit [facility] and is not recommending any intervention at this time and recommends conservative treatment with antibiotics for a total of 14 days.  He would suggest further testing if she deteriorates but otherwise surgery will sign off.   -change IV levaquin and metronidazole to po today for a total of 14 days  -advance diet today to clear liquids -possible d/c tomorrow with close f/u and antibiotics if pt continues improvement   Elevated liver enzymes  Resolving. All trending down --AST 315 from 828, ALT 415 from 643, alk phos 572 from 803. Possibly due to sepsis. Possibly due to NASH (fatty infiltrate seen on abdominal CT). Possibly due to cholelithiasis, although abdominal US showed no signs of cholecystitis. Viral hepatitis is a possibility, but liver enzymes were normal in Feb 2013 making Hep B/C  unlikely, and the patient has no history of travel (Hep A/E). Congestive hepatopathy is possible but no signs of CHF exacerbation (weight is stable, no evidence on CXR). Hepatic venous outflow obstruction not seen on abdominal CT.  We will continue to trend her enzymes via CMP.   -Follow on CMP  -Hold Tylenol and simvastatin   A-fib  Chronic a-fib, has failed cardioversion w/ TEE guidance in May 2010. The patient is on long-term warfarin. Supratherapeutic on admission with INR of 4.4, was in therapeutic range of 2.7 on 02/19/13. Warfarin held last night and INR now slightly sub-therapeutic at 1.84. Admitted to telemetry.  -Coumadin per pharmacy  -Daily PT/INR check   COPD  Stable. GOLD stage C on 2 liters O2 at home. Can go off of O2 while going out if she takes it easy, but can't go for extended periods without it. Has received flu vaccine this year.  -Continue home tiotropium 18 mcg inhaler daily  -Continue home albuterol 2.5 mg neb q4 hrs PRN  -2 L O2 nasal canula PRN   CKD  Baseline creatinine of 1.8-2.1 since March 2013. Creatinine  down to 2.09 from 2.15 on admission. 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  -Monitor   Diastolic CHF  -Continue home bisoprolol 5 mg daily  -Torsemide 80 mg daily   Hypothyroidism  -check TSH pending -Continue home levothyroxine 50 mcg daily    Dispo:  Anticipated discharge tomorrow.   The patient does have a current PCP (Provider Not In System) and does need an [facility] hospital follow-up appointment after discharge.  .Services Needed at time of discharge: Y = Yes, Blank = No PT:   OT:   RN:   Equipment:   Other:     LOS: 2 days   Boykin Peek, MD 03/22/2013, 3:18 PM

## 2013-03-22 NOTE — Progress Notes (Signed)
I repeated the critical or key portions of the exam.  I confirmed/revised the medical student's history, exam, assessment and plan.   

## 2013-03-22 NOTE — Progress Notes (Signed)
Seen and agree with SPT  [name], PT [phone]

## 2013-03-22 NOTE — Progress Notes (Signed)
Subjective: Denies abdominal pain, n/v/d.    Objective: Vital signs in last 24 hours: Temp:  [97.5 F (36.4 C)-98.8 F (37.1 C)] 98.1 F (36.7 C) (12/12 0700) Pulse Rate:  [71-78] 76 (12/12 0700) Resp:  [17-19] 19 (12/12 0700) BP: (108-122)/(52-85) 122/79 mmHg (12/12 0700) SpO2:  [96 %-97 %] 96 % (12/12 0700) Weight:  [185 lb 6.4 oz (84.097 kg)] 185 lb 6.4 oz (84.097 kg) (12/12 0700) Last BM Date: 03/19/13  Intake/Output from previous day: 12/11 0701 - 12/12 0700 In: 500 [IV Piggyback:500] Out: 1175 [Urine:1175] Intake/Output this shift:   PE General appearance: alert, cooperative and no distress GI: soft, non-tender; bowel sounds normal; no masses,  no organomegaly  Lab Results:   Recent Labs  03/21/13 0830 03/22/13 0542  WBC 8.5 9.3  HGB 10.4* 10.5*  HCT 34.1* 35.7*  PLT 186 186   BMET  Recent Labs  03/21/13 0512 03/22/13 0542  NA 138 139  K 3.6 4.4  CL 98 96  CO2 27 27  GLUCOSE 107* 233*  BUN 71* 78*  CREATININE 2.09* 2.21*  CALCIUM 9.3 9.2   PT/INR  Recent Labs  03/21/13 0512 03/22/13 0542  LABPROT 20.7* 16.2*  INR 1.84* 1.33   ABG No results found for this basename: PHART, PCO2, PO2, HCO3,  in the last 72 hours  Studies/Results: Ct Abdomen Pelvis Wo Contrast  03/20/2013   CLINICAL DATA:  Nausea.  Fever.  Fatigue.  Abdominal pain.  EXAM: CT ABDOMEN AND PELVIS WITHOUT CONTRAST  TECHNIQUE: Multidetector CT imaging of the abdomen and pelvis was performed following the standard protocol without intravenous contrast.  COMPARISON:  04/18/2011  FINDINGS: Prominent epicardial adipose tissue. Mild atelectasis in the left lower lobe. Prior bilateral pleural effusions have resolved. There is cardiomegaly along with calcification of the aortic and mitral valves, and coronary artery atherosclerotic calcification.  Prominent lateral segment left hepatic lobe. Small hypodensity in segment 3 along the falciform ligament likely represents focal fatty  infiltration. Spleen and adrenal glands unremarkable in non-contrast CT appearance. Pancreas unremarkable.  Dependent abnormal density in the gallbladder may represent layering gallstones or sludge. Gallbladder moderately distended at 6 cm in diameter.  Stable 1.1 cm exophytic lesion of the posterior right mid kidney. Bilateral renal atrophy. Vascular calcifications along the left renal hilum.  No hydronephrosis or ureteral calculus. Several small bladder cellules are present.  Appendix normal. Scattered air-fluid levels are in the nondilated proximal loops of small bowel.  Aortoiliac atherosclerotic calcification noted do the  There is a 6.6 by 3.6 by 4.0 cm abscess along the posterior margin of the sigmoid colon shown on images 63-72 of series 2. This is adjacent to the left adnexa and contains fluid, gas, and stool like material. It may be continuous with the colon. This abscess extends back towards the left pelvic sidewall and obturator internus muscle, and is adjacent to the left external iliac vasculature. This likely represents a diverticular abscess.  Solid interbody fusion noted at L4-5. Severe loss of disc height at L3-4 with posterior osseous ridging and facet arthropathy. No lumbar spine fracture or acute subluxation.  IMPRESSION: 1. Sigmoid diverticular abscess extends posterolaterally from the sigmoid colon on the left towards the left pelvic sidewall, and contains gas, fluid, and stool like material. Uncertain whether this is connected/draining into the sigmoid colon. The extraluminal gas appears walled off and is not free within the peritoneal space. 2. Colonic diverticulosis. 3. Sludge versus cholelithiasis in the borderline distended gallbladder. 4. Atherosclerosis. 5. Prominent lateral segment  left hepatic lobe, which can be an early sign of cirrhotic liver disease. 6. Bilateral renal atrophy.   Electronically Signed   By: Herbie Baltimore M.D.   On: 03/20/2013 15:57   Dg Chest 2  View  03/21/2013   CLINICAL DATA:  Pneumonia.  EXAM: CHEST  2 VIEW  COMPARISON:  03/20/2013  FINDINGS: The cardiac silhouette remains enlarged, unchanged. The patient is rotated on the lateral image. Mild retrocardiac opacity remains. The right lung remains grossly clear. No definite pleural effusion is identified. No acute osseous abnormality is identified.  IMPRESSION: Mild retrocardiac opacity, which may reflect atelectasis. Right lung remains grossly clear.   Electronically Signed   By: Sebastian Ache   On: 03/21/2013 08:27   Dg Chest 2 View  03/20/2013   CLINICAL DATA:  Nausea and fatigue.  Fever.  EXAM: CHEST  2 VIEW  COMPARISON:  Two-view chest 04/20/2012.  FINDINGS: The heart is enlarged. Patient is rotated on the lateral view. A left pleural effusion is suspected. Left basilar airspace disease is present as well.  IMPRESSION: 1. Asymmetric left pleural effusion and airspace disease is concerning for pneumonia. 2. Stable cardiomegaly without failure.   Electronically Signed   By: Gennette Pac M.D.   On: 03/20/2013 12:23   US Abdomen Complete  03/21/2013   CLINICAL DATA:  History of abnormal liver function tests. History of hypertension and diabetes.  EXAM: ULTRASOUND ABDOMEN COMPLETE  COMPARISON:  CT 03/20/2013.  FINDINGS: Gallbladder:  Multiple small gallstones are seen in the gallbladder representing cholelithiasis. The largest calculus or clump of calculi has greatest diameter of 2.1 cm. There is some sludge within the gallbladder. No gallbladder wall thickening visualized. Gallbladder wall thickness measures 2.7 mm. No pericholecystic fluid is evident. No positive sonographic Murphy sign noted.  Common bile duct:  Diameter: 5.8 mm. No choledocholithiasis or dilatation of branching intrahepatic bile ducts is evident.  Liver:  No focal lesion identified. Within normal limits in parenchymal echogenicity. Hepatic veins appear patent. Portal vein is patent with hepatopetal flow.  IVC:  No  abnormality visualized.  Pancreas:  Visualized portion unremarkable. Portions of the head and distal tail of the pancreas are obscured by bowel gas.  Spleen:  Size and appearance within normal limits.  Length is 8 cm.  Right Kidney:  Length: Right renal length is 9.7 cm. There is a tiny cyst in the upper pole with greatest diameter of 6.7 mm. There is another cyst in the upper pole. This cyst measures 1.1 x 0.9 x 0.9 cm. There may be a partial tiny thin septation along the edge seen on sagittal image. No solid nodularity is evident. There is cortical thinning. Echogenicity within normal limits. No solid mass or hydronephrosis visualized.  Left Kidney:  Length: Left renal length is 10.6 cm. There is a cystic mass involving the lower pole of the left kidney. This mass measures 1.2 x 1.5 x 1.4 cm. There is cortical thinning. There are few low intensity internal echoes which may reflect debris or artifact. No solid nodularity is evident. Echogenicity within normal limits. No solid mass or hydronephrosis visualized.  Abdominal aorta:  No aneurysm visualized. Maximum diameter is 2.5 cm. There is atherosclerotic plaquing. Distal portion is obscured by overlying bowel gas.  Other findings:  None.  IMPRESSION: Multiple calculi are seen within the gallbladder representing cholelithiasis. No ultrasonic evidence of cholecystitis is seen. Bile ducts appear normal. No hepatic abnormalities are identified. Small renal cysts are seen. There may be a partial tiny  thin septation involving 1 cyst of the right kidney. A few internal echoes are seen involving a small cyst in the left kidney. Suggest followup ultrasound of the kidneys be obtained in approximately 6 months to assess for stability of the small cystic areas.   Electronically Signed   By: Onalee Hua  Call M.D.   On: 03/21/2013 08:31    Anti-infectives: Anti-infectives   Start     Dose/Rate Route Frequency Ordered Stop   03/22/13 0800  levofloxacin (LEVAQUIN) IVPB 750 mg   Status:  Discontinued     750 mg 100 mL/hr over 90 Minutes Intravenous Every 24 hours 03/21/13 1532 03/21/13 1533   03/22/13 0800  levofloxacin (LEVAQUIN) IVPB 750 mg     750 mg 100 mL/hr over 90 Minutes Intravenous Every 48 hours 03/21/13 1534     03/21/13 0800  ciprofloxacin (CIPRO) IVPB 400 mg  Status:  Discontinued     400 mg 200 mL/hr over 60 Minutes Intravenous Every 24 hours 03/21/13 0747 03/21/13 1531   03/21/13 0745  ciprofloxacin (CIPRO) IVPB 400 mg  Status:  Discontinued     400 mg 200 mL/hr over 60 Minutes Intravenous Every 12 hours 03/21/13 0741 03/21/13 0746   03/20/13 2000  metroNIDAZOLE (FLAGYL) IVPB 500 mg     500 mg 100 mL/hr over 60 Minutes Intravenous Every 8 hours 03/20/13 1921     03/20/13 1315  azithromycin (ZITHROMAX) 500 mg in dextrose 5 % 250 mL IVPB  Status:  Discontinued     500 mg 250 mL/hr over 60 Minutes Intravenous  Once 03/20/13 1307 03/20/13 1307   03/20/13 1315  azithromycin (ZITHROMAX) 500 mg in dextrose 5 % 250 mL IVPB     500 mg 250 mL/hr over 60 Minutes Intravenous  Once 03/20/13 1307 03/20/13 1545   03/20/13 1315  cefTRIAXone (ROCEPHIN) 1 g in dextrose 5 % 50 mL IVPB     1 g 100 mL/hr over 30 Minutes Intravenous  Once 03/20/13 1308 03/20/13 1419      Assessment/Plan: 79 year old female with a hx of AF, COPD, OSA, obesity, hypothyroidism, diastolic heart failure, CKD  Diverticulitis transaminitis  -low suspicion for abscess after reviewing CT scan with IR.  If concern for an abscess remains, consider gastrograffin enema or dedicated CT with rectal contrast.  She is completely non tender, afebrile with a normal white count.  Would recommend starting the patient on clear liquid diet today and advancing as tolerated. Recommend transitioning to oral antibiotics close to anticipated discharge for a total of 14 days of therapy.    LOS: 2 days   Gregori Abril ANP-BC 03/22/2013 7:41 AM

## 2013-03-22 NOTE — Progress Notes (Signed)
Physical Therapy Treatment Patient Details Name: [name] MRN: [id number] DOB: 06/08/1930 Today's Date: 03/22/2013 Time: 6578-4696 PT Time Calculation (min): 32 min  PT Assessment / Plan / Recommendation  History of Present Illness [name] is a 79 year old woman with PMH of GERD, dCHF, CKD, hyperlipidemia, COPD, a-fib, PVD, hypothyroidism, diverticulosis, and multiple episodes of C diff who presents with a one-day history of fever, chills, weakness, and vomiting x1. She woke up this morning feeling cold and weak, and vomited once after having a few sips of water. Her daughter checked her temperature and found it was 101.4. She usually gets around with a walker, but she felt too weak to use it so her daughter had to help her into her wheelchair to come to the ED   PT Comments   Pt tolerated treatment well today. She is progressing toward mobility goals and reports that she is eager to be taken off dietary restrictions. States that she is moving slowly to conserve energy and due to feeling weak. She will benefit from continues PT services in order to address decreased activity tolerance and work towards goals.  Follow Up Recommendations  Home health PT     Does the patient have the potential to tolerate intense rehabilitation     Barriers to Discharge        Equipment Recommendations       Recommendations for Other Services    Frequency     Progress towards PT Goals Progress towards PT goals: Progressing toward goals  Plan Current plan remains appropriate    Precautions / Restrictions Precautions Precautions: Fall Precaution Comments: oxygen   Pertinent Vitals/Pain At rest SpO2 96% on 2L, dropped to 86% on 2L with amb. 99% with ambulation on 3L.    Mobility  Bed Mobility Bed Mobility: Not assessed Transfers Sit to Stand: From chair/3-in-1;With upper extremity assist;With armrests;4: Min assist Stand to Sit: To chair/3-in-1;With armrests;With upper extremity  assist;4: Min guard Details for Transfer Assistance: cues for hand placement for anterior translation of hips and trunk extension, assist to lift hips and for safety;  Ambulation/Gait Ambulation/Gait Assistance: 4: Min guard Ambulation Distance (Feet): 110 Feet Assistive device: Rolling walker Ambulation/Gait Assistance Details: cues for posture, position in RW and control of RW to avoid obstacles Gait Pattern: Decreased stride length;Wide base of support;Step-through pattern Gait velocity: decreased Stairs: No    Exercises General Exercises - Lower Extremity Ankle Circles/Pumps: AROM;Both;20 reps;Seated Long Arc Quad: AROM;Seated;Both;15 reps Toe Raises: AROM;Seated;Both;10 reps Heel Raises: AROM;Seated;Both;10 reps   PT Diagnosis:    PT Problem List:   PT Treatment Interventions:     PT Goals (current goals can now be found in the care plan section)    Visit Information  Last PT Received On: 03/22/13 Assistance Needed: +1 History of Present Illness: [name] is a 79 year old woman with PMH of GERD, dCHF, CKD, hyperlipidemia, COPD, a-fib, PVD, hypothyroidism, diverticulosis, and multiple episodes of C diff who presents with a one-day history of fever, chills, weakness, and vomiting x1. She woke up this morning feeling cold and weak, and vomited once after having a few sips of water. Her daughter checked her temperature and found it was 101.4. She usually gets around with a walker, but she felt too weak to use it so her daughter had to help her into her wheelchair to come to the ED    Subjective Data      Cognition  Cognition Arousal/Alertness: Awake/alert Behavior During Therapy: [facility] for tasks assessed/performed  Overall Cognitive Status: Within Functional Limits for tasks assessed    Balance     End of Session PT - End of Session Equipment Utilized During Treatment: Gait belt;Oxygen Activity Tolerance: Patient tolerated treatment well Patient left: in chair;with  call bell/phone within reach Nurse Communication: Mobility status   GP     Willette Pa, SPT 03/22/2013, 9:40 AM

## 2013-03-22 NOTE — Progress Notes (Signed)
ANTICOAGULATION CONSULT NOTE - Follow-Up Consult  Pharmacy Consult for coumadin Indication: atrial fibrillation  Allergies  Allergen Reactions  . Sulfonamide Derivatives     Childhood reaction    Patient Measurements: Height: 5\' 3"  (160 cm) Weight: 185 lb 6.4 oz (84.097 kg) IBW/kg (Calculated) : 52.4 Heparin Dosing Weight:   Vital Signs: Temp: 98.1 F (36.7 C) (12/12 0700) Temp src: Oral (12/12 0700) BP: 122/79 mmHg (12/12 0700) Pulse Rate: 70 (12/12 0843)  Labs:  Recent Labs  03/20/13 1110 03/20/13 1323 03/21/13 0512 03/21/13 0830 03/22/13 0542  HGB 12.0  --   --  10.4* 10.5*  HCT 38.9  --   --  34.1* 35.7*  PLT 205  --   --  186 186  LABPROT  --  40.3* 20.7*  --  16.2*  INR  --  4.40* 1.84*  --  1.33  CREATININE 2.15*  --  2.09*  --  2.21*    Estimated Creatinine Clearance: 20.9 ml/min (by C-G formula based on Cr of 2.21).   Medical History: Past Medical History  Diagnosis Date  . Diabetes mellitus   . Obesity   . COPD (chronic obstructive pulmonary disease)     Moderate. PFTs (12/10): FVC 67%, FEV1 58%, ratio 60%, TLC 95%, RV 138%, DLCO 43% (moderate obstructive defect). She is on home oxygen that should be worn at all times.  . Diastolic CHF, chronic     Echo (3/12): EF 55-60%, mild LV hypertrophy, grade I diastolic dysfunction, mild left atrial enlargement, normal pulmonary artery pressure  . CKD (chronic kidney disease)     Cr 2.4 when last checked. 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 PNA (pneumonia)     h/o with ARDS in 2005; MRSA PNA with prolonged hospitalization in [facility] regional 11/11  . Diverticulum     Jejunal  . OSA (obstructive sleep apnea)     possible. When pt was sedated for TEE in May 2010, she did develop some upper airway obstruction  . Anemia     of chronic disease  . Hypothyroidism   . Gout   . Atrial fibrillation 07/2008    Failed cardioversion with TEE guidance in May 2010. Pt went back to NSR  by 11/2008 with amiodarne  . Hypertension   . GERD (gastroesophageal reflux disease)   . Peripheral vascular disease   . Pneumonia   . Chronic kidney disease   . Respiratory failure, chronic 07/01/2011  . Chronic gout due to renal impairment 07/01/2011    Medications:  Scheduled:  . albuterol  2.5 mg Nebulization BID  . bisoprolol  5 mg Oral Daily  . heparin subcutaneous  5,000 Units Subcutaneous Q8H  . levofloxacin (LEVAQUIN) IV  750 mg Intravenous Q48H  . levothyroxine  50 mcg Oral QAC breakfast  . metronidazole  500 mg Intravenous Q8H  . predniSONE  30 mg Oral Q breakfast   Followed by  . [START ON 03/24/2013] predniSONE  10 mg Oral Q breakfast  . sodium chloride  3 mL Intravenous Q12H  . tiotropium  18 mcg Inhalation Daily  . Warfarin - Pharmacist Dosing Inpatient   Does not apply q1800    Assessment: 79 yo female with hx of afib resumed Coumadin on 12/11 since surgery is unlikely. Patient was on 2mg  MWF and 3mg  other days prior to admission. Last dose was 03/19/13. Admitting INR was 4.4. Patient was given IV vit K on 12/10 and level dropped to 1.8. Patient was given 2mg   warfarin on 12/11 and INR on 12/12 was 1.33. Patient is also on SQ heparin. Hg 10.5 and platelets wnl. No S/S bleeding. Patient is on Flagyl and Levaquin.   Goal of Therapy:  INR 2-3 Monitor platelets by anticoagulation protocol: Yes   Plan:  - Coumadin 2 mg PO x 1 today - Monitor CBC, INR, and S/S bleeding  [name] A. Lenon Ahmadi, PharmD Clinical Pharmacist - Resident Pager: [phone] Pharmacy: [phone] 03/22/2013 11:52 AM

## 2013-03-22 NOTE — Progress Notes (Signed)
Pt. Alert oriented. No s/s of distress or discomfort noted during the night. Pt. Denies pain. Currently resting in recliner. Call light within reach. RN will continue to monitor pt. For changes in condition. Yoshito Gaza, Cheryll Dessert'

## 2013-03-23 LAB — COMPREHENSIVE METABOLIC PANEL
AST: 430 U/L — ABNORMAL HIGH (ref 0–37)
AST: 301 U/L — ABNORMAL HIGH (ref 0–37)
Alkaline Phosphatase: 890 U/L — ABNORMAL HIGH (ref 39–117)
BUN: 74 mg/dL — ABNORMAL HIGH (ref 6–23)
CO2: 26 mEq/L (ref 19–32)
CO2: 26 mEq/L (ref 19–32)
Calcium: 9.2 mg/dL (ref 8.4–10.5)
Chloride: 96 mEq/L (ref 96–112)
Creatinine, Ser: 1.83 mg/dL — ABNORMAL HIGH (ref 0.50–1.10)
Creatinine, Ser: 1.8 mg/dL — ABNORMAL HIGH (ref 0.50–1.10)
GFR calc Af Amer: 29 mL/min — ABNORMAL LOW (ref >90)
GFR calc non Af Amer: 25 mL/min — ABNORMAL LOW (ref >90)
GFR calc non Af Amer: 25 mL/min — ABNORMAL LOW (ref >90)
Total Bilirubin: 1.3 mg/dL — ABNORMAL HIGH (ref 0.3–1.2)
Total Protein: 6.3 g/dL (ref 6.0–8.3)

## 2013-03-23 LAB — PROTIME-INR
INR: 1.4 (ref 0.00–1.49)
Prothrombin Time: 16.8 seconds — ABNORMAL HIGH (ref 11.6–15.2)

## 2013-03-23 LAB — GLUCOSE, CAPILLARY
Glucose-Capillary: 314 mg/dL — ABNORMAL HIGH (ref 70–99)
Glucose-Capillary: 294 mg/dL — ABNORMAL HIGH (ref 70–99)

## 2013-03-23 MED ORDER — MORPHINE SULFATE 2 MG/ML IJ SOLN
2.0000 mg | INTRAMUSCULAR | Status: DC | PRN
  Administered 2013-03-23 – 2013-03-24 (×2): 2 mg via INTRAVENOUS
  Filled 2013-03-23 (×2): qty 1

## 2013-03-23 MED ORDER — MORPHINE SULFATE 2 MG/ML IJ SOLN
INTRAMUSCULAR | Status: AC
  Administered 2013-03-23: 2 mg via INTRAVENOUS
  Filled 2013-03-23: qty 1

## 2013-03-23 MED ORDER — WARFARIN SODIUM 3 MG PO TABS
3.0000 mg | ORAL_TABLET | Freq: Once | ORAL | Status: AC
  Administered 2013-03-23: 18:00:00 3 mg via ORAL
  Filled 2013-03-23: qty 1

## 2013-03-23 MED ORDER — INSULIN ASPART 100 UNIT/ML ~~LOC~~ SOLN
0.0000 [IU] | SUBCUTANEOUS | Status: DC
  Administered 2013-03-23: 18:00:00 5 [IU] via SUBCUTANEOUS
  Administered 2013-03-23: 12:00:00 7 [IU] via SUBCUTANEOUS
  Administered 2013-03-23: 5 [IU] via SUBCUTANEOUS
  Administered 2013-03-23: 3 [IU] via SUBCUTANEOUS
  Administered 2013-03-24: 1 [IU] via SUBCUTANEOUS
  Administered 2013-03-24: 3 [IU] via SUBCUTANEOUS
  Administered 2013-03-24: 1 [IU] via SUBCUTANEOUS
  Administered 2013-03-24: 5 [IU] via SUBCUTANEOUS

## 2013-03-23 NOTE — Progress Notes (Signed)
ANTICOAGULATION CONSULT NOTE - Follow Up Consult  Pharmacy Consult for Couamdin Indication: atrial fibrillation  Allergies  Allergen Reactions  . Sulfonamide Derivatives     Childhood reaction    Patient Measurements: Height: 5\' 3"  (160 cm) Weight: 197 lb 1.5 oz (89.4 kg) IBW/kg (Calculated) : 52.4 Heparin Dosing Weight:   Vital Signs: Temp: 98 F (36.7 C) (12/13 0537) Temp src: Oral (12/13 0537) BP: 122/49 mmHg (12/13 0537) Pulse Rate: 94 (12/13 0537)  Labs:  Recent Labs  03/21/13 0512 03/21/13 0830 03/22/13 0542 03/23/13 0525  HGB  --  10.4* 10.5*  --   HCT  --  34.1* 35.7*  --   PLT  --  186 186  --   LABPROT 20.7*  --  16.2* 16.8*  INR 1.84*  --  1.33 1.40  CREATININE 2.09*  --  2.21* 1.83*    Estimated Creatinine Clearance: 26 ml/min (by C-G formula based on Cr of 1.83).   Medications:  Scheduled:  . albuterol  2.5 mg Nebulization BID  . bisoprolol  5 mg Oral Daily  . heparin subcutaneous  5,000 Units Subcutaneous Q8H  . insulin aspart  0-9 Units Subcutaneous Q4H  . [START ON 03/24/2013] levofloxacin  500 mg Oral Q48H  . levothyroxine  50 mcg Oral QAC breakfast  . metroNIDAZOLE  500 mg Oral Q8H  . [START ON 03/24/2013] predniSONE  10 mg Oral Q breakfast  . sodium chloride  3 mL Intravenous Q12H  . tiotropium  18 mcg Inhalation Daily  . Warfarin - Pharmacist Dosing Inpatient   Does not apply q1800    Assessment: 79yo female with AFib, on antibiotics for diveriticulitis/cholecystitis.  INR 1.4 today, no CBC today but previous Hg & pltc were stable.  No bleeding problems noted.  Goal of Therapy:  INR 2-3 Monitor platelets by anticoagulation protocol: Yes   Plan:  Coumadin 3mg  today F/U in AM  Marisue Humble, PharmD Clinical Pharmacist [facility]

## 2013-03-23 NOTE — Progress Notes (Signed)
Subjective: Patient reports recurrence of abdominal pain since last night. She rates the pain as a 7/10 but she did not ask for pain medications. The pain is in the same abdominal location as during her admission pain. It feels the same. She denies vomiting, fevers or other complaints. She has not had a BM. She continues to try clear liquids which were started yesterday.   Objective: Vital signs in last 24 hours: Filed Vitals:   03/22/13 2021 03/22/13 2132 03/23/13 0537 03/23/13 0816  BP:  139/67 122/49   Pulse:  82 94   Temp:  97.7 F (36.5 C) 98 F (36.7 C)   TempSrc:  Oral Oral   Resp:  18 18   Height:      Weight:   197 lb 1.5 oz (89.4 kg)   SpO2: 98% 96% 95% 97%   Weight change: 11 lb 11.1 oz (5.303 kg)  Intake/Output Summary (Last 24 hours) at 03/23/13 0953 Last data filed at 03/23/13 0900  Gross per 24 hour  Intake   1860 ml  Output    900 ml  Net    960 ml   Constitutional: Vital signs reviewed.  Patient is a well-developed and well-nourished female in no acute distress and cooperative with exam.   Head: Normocephalic and atraumatic Eyes: EOMI, No scleral icterus.  Neck: Supple, Trachea midline .  Cardiovascular: RRR, S1 normal, S2 normal, no MRG, pulses symmetric and intact bilaterally Pulmonary/Chest: normal respiratory effort, mild fine crackles at the left base Abdominal: Soft. Obese. Non-tender, non-distended, bowel sounds are normal, no masses, organomegaly, or guarding present.  Neurological: cranial nerve II-XII are grossly intact, moving all extremities without difficulty Skin: Warm, dry and intact. She has chronic venous stasis b/l on her lower extremities; no signs of infection Psychiatric: Normal mood and affect.   Lab Results: Basic Metabolic Panel:  Recent Labs Lab 03/22/13 0542 03/23/13 0525  NA 139 138  K 4.4 4.3  CL 96 96  CO2 27 26  GLUCOSE 233* 217*  BUN 78* 74*  CREATININE 2.21* 1.83*  CALCIUM 9.2 9.4   Liver Function  Tests:  Recent Labs Lab 03/22/13 0542 03/23/13 0525  AST 87* 430*  ALT 282* 381*  ALKPHOS 482* 880*  BILITOT 0.3 1.3*  PROT 6.5 6.3  ALBUMIN 2.8* 2.8*    Recent Labs Lab 03/20/13 1323  LIPASE 23   No results found for this basename: AMMONIA,  in the last 168 hours CBC:  Recent Labs Lab 03/20/13 1110 03/21/13 0830 03/22/13 0542  WBC 11.3* 8.5 9.3  NEUTROABS 10.2*  --   --   HGB 12.0 10.4* 10.5*  HCT 38.9 34.1* 35.7*  MCV 98.7 100.3* 100.6*  PLT 205 186 186   CBG:  Recent Labs Lab 03/22/13 0817 03/23/13 0656  GLUCAP 214* 240*     Recent Labs Lab 03/20/13 1323 03/21/13 0512 03/22/13 0542 03/23/13 0525  LABPROT 40.3* 20.7* 16.2* 16.8*  INR 4.40* 1.84* 1.33 1.40   Urinalysis:  Recent Labs Lab 03/20/13 1215  COLORURINE YELLOW  LABSPEC 1.011  PHURINE 5.0  GLUCOSEU NEGATIVE  HGBUR MODERATE*  BILIRUBINUR NEGATIVE  KETONESUR NEGATIVE  PROTEINUR NEGATIVE  UROBILINOGEN 0.2  NITRITE NEGATIVE  LEUKOCYTESUR SMALL*    Micro Results: Recent Results (from the past 240 hour(s))  URINE CULTURE     Status: None   Collection Time    03/20/13 12:15 PM      Result Value Range Status   Specimen Description URINE, CATHETERIZED  Final   Special Requests NONE   Final   Culture  Setup Time     Final   Value: 03/20/2013 00:00     Performed at [facility] Count     Final   Value: NO GROWTH     Performed at [facility]   Culture     Final   Value: NO GROWTH     Performed at [facility]   Report Status 03/21/2013 FINAL   Final  CULTURE, BLOOD (ROUTINE X 2)     Status: None   Collection Time    03/20/13  6:15 PM      Result Value Range Status   Specimen Description BLOOD HAND RIGHT   Final   Special Requests BOTTLES DRAWN AEROBIC ONLY 10CC   Final   Culture  Setup Time     Final   Value: 03/21/2013 00:06     Performed at [facility]   Culture     Final   Value:        BLOOD CULTURE RECEIVED NO GROWTH TO  DATE CULTURE WILL BE HELD FOR 5 DAYS BEFORE ISSUING A FINAL NEGATIVE REPORT     Performed at [facility]   Report Status PENDING   Incomplete  CULTURE, BLOOD (ROUTINE X 2)     Status: None   Collection Time    03/20/13  6:20 PM      Result Value Range Status   Specimen Description BLOOD ARM LEFT   Final   Special Requests BOTTLES DRAWN AEROBIC AND ANAEROBIC 10CC   Final   Culture  Setup Time     Final   Value: 03/21/2013 00:05     Performed at [facility]   Culture     Final   Value:        BLOOD CULTURE RECEIVED NO GROWTH TO DATE CULTURE WILL BE HELD FOR 5 DAYS BEFORE ISSUING A FINAL NEGATIVE REPORT     Performed at [facility]   Report Status PENDING   Incomplete  MRSA PCR SCREENING     Status: Abnormal   Collection Time    03/21/13  1:17 PM      Result Value Range Status   MRSA by PCR POSITIVE (*) NEGATIVE Final   Comment:            The GeneXpert MRSA Assay (FDA     approved for NASAL specimens     only), is one component of a     comprehensive MRSA colonization     surveillance program. It is not     intended to diagnose MRSA     infection nor to guide or     monitor treatment for     MRSA infections.     RESULT CALLED TO, READ BACK BY AND VERIFIED WITH:     Windle Guard RN 15:30 03/21/13 ([email or web address])   Studies/Results: No results found. Medications: I have reviewed the patient's current medications. Scheduled Meds: . albuterol  2.5 mg Nebulization BID  . bisoprolol  5 mg Oral Daily  . heparin subcutaneous  5,000 Units Subcutaneous Q8H  . insulin aspart  0-9 Units Subcutaneous Q4H  . [START ON 03/24/2013] levofloxacin  500 mg Oral Q48H  . levothyroxine  50 mcg Oral QAC breakfast  . metroNIDAZOLE  500 mg Oral Q8H  . [START ON 03/24/2013] predniSONE  10 mg Oral Q breakfast  . sodium chloride  3 mL Intravenous  Q12H  . tiotropium  18 mcg Inhalation Daily  . Warfarin - Pharmacist Dosing Inpatient   Does not apply q1800   Continuous Infusions:  PRN  Meds:.albuterol, morphine injection Assessment/Plan: Principal Problem:   Colonic diverticular abscess Active Problems:   HYPOTHYROIDISM   HYPERTENSION, UNSPECIFIED   Atrial fibrillation   DIASTOLIC HEART FAILURE, CHRONIC   COPD with emphysema   Osteoarthritis   Transaminitis   Generalized weakness   Chronic kidney disease (CKD), stage IV    Possible Diverticular Abscess per CT imaging  Surgery, VIR and radiology in agreement that abnormalities seen on abdominal CT not suggestive of diverticular abscess (no evidence of inflammation), but rather redundant colon. Symptoms had resolved but she has what sounds like a recurrency on the night of 12/13. Original cause still not certain. May be due to pneumonia given crackles on exam and admission CXR findings, however there is no cough, chest pain, sputum production. Met 3/4 SIRS criteria in ED (Tmax 102.8, RR >20, HR >100), and has elevated lactic acid.    Plan - Initially on IV metronidazole and Levaquin and transitioned to oral Flagyl and Levaquin on 03/22/2013 - this is day # 4/14 of antibiotics - BC have been NGTD.   - Dr. Gerrit Friends of [facility] recommends conservative treatment with antibiotics for a total of 14 days.   - surgery signed off.   - maintain on clear liquids today but is symptoms worsen will make her NPO again - no need for repeat imaging  - start IV Morphine 2 mg q4 prn  - hold off on d/c until she is better. - she will need HH PT at discharge  Elevated liver enzymes: Initially trending down but elevated on 03/23/2013. Possibly due to sepsis versus NASH (fatty infiltrate seen on abdominal CT). Possibly due to cholelithiasis, although abdominal US showed no signs of cholecystitis. Viral hepatitis is a possibility, but liver enzymes were normal in Feb 2013 making Hep B/C unlikely, and the patient has no history of travel (Hep A/E). Congestive hepatopathy is possible but no signs of CHF exacerbation (weight is stable, no evidence on  CXR). Hepatic venous outflow obstruction not seen on abdominal CT.    Plan  - Surprisingly her LFTs had been trending down but today's labs show an elevation compared to yesterday's labs. I wonder whether this was a lab error - repeat CMP today  -Hold Tylenol and simvastatin   A-fib  Chronic a-fib, has failed cardioversion w/ TEE guidance in May 2010. The patient is on long-term warfarin. Supratherapeutic on admission with INR of 4.4, was in therapeutic range of 2.7 on 02/19/13. Warfarin held last night and INR now slightly sub-therapeutic at 1.84. Admitted to telemetry.  -Coumadin per pharmacy  -Daily PT/INR check   COPD  Stable. GOLD stage C on 2 liters O2 at home. Can go off of O2 while going out if she takes it easy, but can't go for extended periods without it. Has received flu vaccine this year.  -Continue home tiotropium 18 mcg inhaler daily  -Continue home albuterol 2.5 mg neb q4 hrs PRN  -2 L O2 nasal canula PRN   CKD  Baseline creatinine of 1.8-2.1 since March 2013. Creatinine down to 2.09 from 2.15 on admission. 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  -Monitor   Diastolic CHF  -Continue home bisoprolol 5 mg daily  -Torsemide 80 mg daily   Hypothyroidism  -check TSH pending -Continue home levothyroxine 50 mcg daily    Dispo:  Pending  stable oral intake and resolution of symptoms.   The patient does have a current PCP (Provider Not In System) and does need an [facility] hospital follow-up appointment after discharge.  .Services Needed at time of discharge: Y = Yes, Blank = No PT:   OT:   RN:   Equipment:   Other:     LOS: 3 days   Dow Adolph, MD 03/23/2013, 9:53 AM

## 2013-03-24 LAB — GLUCOSE, CAPILLARY
Glucose-Capillary: 127 mg/dL — ABNORMAL HIGH (ref 70–99)
Glucose-Capillary: 287 mg/dL — ABNORMAL HIGH (ref 70–99)
Glucose-Capillary: 237 mg/dL — ABNORMAL HIGH (ref 70–99)

## 2013-03-24 LAB — HEPATITIS PANEL, ACUTE: Hepatitis B Surface Ag: NEGATIVE

## 2013-03-24 LAB — TSH
TSH: 0.51 u[IU]/mL (ref 0.350–4.500)
TSH: 0.959 u[IU]/mL (ref 0.350–4.500)

## 2013-03-24 LAB — PROTIME-INR
INR: 1.98 — ABNORMAL HIGH (ref 0.00–1.49)
Prothrombin Time: 21.9 seconds — ABNORMAL HIGH (ref 11.6–15.2)

## 2013-03-24 LAB — GAMMA GT: GGT: 1533 U/L — ABNORMAL HIGH (ref 7–51)

## 2013-03-24 MED ORDER — TRAMADOL HCL 50 MG PO TABS: 25.0000 mg | ORAL_TABLET | Freq: Two times a day (BID) | ORAL | Status: DC | PRN

## 2013-03-24 MED ORDER — WARFARIN SODIUM 2 MG PO TABS
2.0000 mg | ORAL_TABLET | Freq: Once | ORAL | Status: AC
  Administered 2013-03-24: 2 mg via ORAL
  Filled 2013-03-24: qty 1

## 2013-03-24 MED ORDER — LEVOFLOXACIN 500 MG PO TABS: 500.0000 mg | ORAL_TABLET | ORAL | Status: AC

## 2013-03-24 MED ORDER — METRONIDAZOLE 500 MG PO TABS: 500.0000 mg | ORAL_TABLET | Freq: Three times a day (TID) | ORAL | Status: AC

## 2013-03-24 MED ORDER — WARFARIN SODIUM 2 MG PO TABS: 2.0000 mg | ORAL_TABLET | Freq: Every day | ORAL | Status: DC

## 2013-03-24 NOTE — Progress Notes (Signed)
   CARE MANAGEMENT NOTE 03/24/2013  Patient:  [name], [name]   Account Number:  [id number]  Date Initiated:  03/24/2013  Documentation initiated by:  [facility]  Subjective/Objective Assessment:   adm: fever, chills, weakness, vomiting x1     Action/Plan:   discharge planning   Anticipated DC Date:  03/24/2013   Anticipated DC Plan:  HOME W HOME HEALTH SERVICES      DC Planning Services  CM consult      [facility] Choice  HOME HEALTH   Choice offered to / List presented to:          [facility] arranged  HH-2 PT      [facility] agency  [facility]   Status of service:  Completed, signed off Medicare Important Message given?   (If response is "NO", the following Medicare IM given date fields will be blank) Date Medicare IM given:   Date Additional Medicare IM given:    Discharge Disposition:  HOME W HOME HEALTH SERVICES  Per UR Regulation:    If discussed at Long Length of Stay Meetings, dates discussed:    Comments:  03/24/13 16:10 Cm spoke with pt in room for choice.  Pt chooses [facility] for HHPT.  Address and contact numbers were verified.  Referral faxed to [facility].  no other CM needs were communicated.  Freddy Jaksch, BSN, CM [phone].

## 2013-03-24 NOTE — Discharge Summary (Signed)
Medical Student Discharge Summary  Name: [name] MRN: [id number] DOB/AGE: 1930/10/09 79 y.o.  PCP: Provider Not In System  Admit date: 03/20/2013 Discharge date: 03/24/2013  Attending Provider: No att. providers found   Admission Diagnoses:   1. Fever of unknown origin 2. Elevated liver enzymes 3. Atrial fibrillation 4. COPD 5. Hypothyroidism 6. diastolic CHF 7. Chronic kidney disease Present on Admission:  . Transaminitis . HYPOTHYROIDISM . HYPERTENSION, UNSPECIFIED . Atrial fibrillation . DIASTOLIC HEART FAILURE, CHRONIC . COPD with emphysema . Osteoarthritis . (Resolved) Fever of unknown origin . Generalized weakness . Chronic kidney disease (CKD), stage IV  . Colonic diverticular abscess   Discharge Diagnoses:  1. Elevated liver enzymes 2. Atrial fibrillation 3. COPD 4. Hypothyroidism 5. diastolic CHF 6. Chronic kidney disease Principal Problem:   Colonic diverticular abscess Active Problems:   HYPOTHYROIDISM   HYPERTENSION, UNSPECIFIED   Atrial fibrillation   DIASTOLIC HEART FAILURE, CHRONIC   COPD with emphysema   Osteoarthritis   Transaminitis   Generalized weakness   Chronic kidney disease (CKD), stage IV     Consultations: General surgery: consulted given possibility of diverticular abscess seen on admission abdominal CT. After reviewing the abdominal CT with VIR, surgery concluded there was no evidence of diverticular abscess, and instead only a large, thick walled diverticulum that was not inflamed.  Pharmacology: consulted to aid in dosing the patient's warfarin.   Procedures performed: Ct Abdomen Pelvis Wo Contrast  03/20/2013   CLINICAL DATA:  Nausea.  Fever.  Fatigue.  Abdominal pain.  EXAM: CT ABDOMEN AND PELVIS WITHOUT CONTRAST  TECHNIQUE: Multidetector CT imaging of the abdomen and pelvis was performed following the standard protocol without intravenous contrast.  COMPARISON:  04/18/2011  FINDINGS: Prominent epicardial adipose  tissue. Mild atelectasis in the left lower lobe. Prior bilateral pleural effusions have resolved. There is cardiomegaly along with calcification of the aortic and mitral valves, and coronary artery atherosclerotic calcification.  Prominent lateral segment left hepatic lobe. Small hypodensity in segment 3 along the falciform ligament likely represents focal fatty infiltration. Spleen and adrenal glands unremarkable in non-contrast CT appearance. Pancreas unremarkable.  Dependent abnormal density in the gallbladder may represent layering gallstones or sludge. Gallbladder moderately distended at 6 cm in diameter.  Stable 1.1 cm exophytic lesion of the posterior right mid kidney. Bilateral renal atrophy. Vascular calcifications along the left renal hilum.  No hydronephrosis or ureteral calculus. Several small bladder cellules are present.  Appendix normal. Scattered air-fluid levels are in the nondilated proximal loops of small bowel.  Aortoiliac atherosclerotic calcification noted do the  There is a 6.6 by 3.6 by 4.0 cm abscess along the posterior margin of the sigmoid colon shown on images 63-72 of series 2. This is adjacent to the left adnexa and contains fluid, gas, and stool like material. It may be continuous with the colon. This abscess extends back towards the left pelvic sidewall and obturator internus muscle, and is adjacent to the left external iliac vasculature. This likely represents a diverticular abscess.  Solid interbody fusion noted at L4-5. Severe loss of disc height at L3-4 with posterior osseous ridging and facet arthropathy. No lumbar spine fracture or acute subluxation.  IMPRESSION: 1. Sigmoid diverticular abscess extends posterolaterally from the sigmoid colon on the left towards the left pelvic sidewall, and contains gas, fluid, and stool like material. Uncertain whether this is connected/draining into the sigmoid colon. The extraluminal gas appears walled off and is not free within the  peritoneal space. 2. Colonic diverticulosis. 3. Sludge  versus cholelithiasis in the borderline distended gallbladder. 4. Atherosclerosis. 5. Prominent lateral segment left hepatic lobe, which can be an early sign of cirrhotic liver disease. 6. Bilateral renal atrophy.   Electronically Signed   By: Herbie Baltimore M.D.   On: 03/20/2013 15:57   Dg Chest 2 View  03/21/2013   CLINICAL DATA:  Pneumonia.  EXAM: CHEST  2 VIEW  COMPARISON:  03/20/2013  FINDINGS: The cardiac silhouette remains enlarged, unchanged. The patient is rotated on the lateral image. Mild retrocardiac opacity remains. The right lung remains grossly clear. No definite pleural effusion is identified. No acute osseous abnormality is identified.  IMPRESSION: Mild retrocardiac opacity, which may reflect atelectasis. Right lung remains grossly clear.   Electronically Signed   By: Sebastian Ache   On: 03/21/2013 08:27   Dg Chest 2 View  03/20/2013   CLINICAL DATA:  Nausea and fatigue.  Fever.  EXAM: CHEST  2 VIEW  COMPARISON:  Two-view chest 04/20/2012.  FINDINGS: The heart is enlarged. Patient is rotated on the lateral view. A left pleural effusion is suspected. Left basilar airspace disease is present as well.  IMPRESSION: 1. Asymmetric left pleural effusion and airspace disease is concerning for pneumonia. 2. Stable cardiomegaly without failure.   Electronically Signed   By: Gennette Pac M.D.   On: 03/20/2013 12:23   US Abdomen Complete  03/21/2013   CLINICAL DATA:  History of abnormal liver function tests. History of hypertension and diabetes.  EXAM: ULTRASOUND ABDOMEN COMPLETE  COMPARISON:  CT 03/20/2013.  FINDINGS: Gallbladder:  Multiple small gallstones are seen in the gallbladder representing cholelithiasis. The largest calculus or clump of calculi has greatest diameter of 2.1 cm. There is some sludge within the gallbladder. No gallbladder wall thickening visualized. Gallbladder wall thickness measures 2.7 mm. No pericholecystic fluid  is evident. No positive sonographic Murphy sign noted.  Common bile duct:  Diameter: 5.8 mm. No choledocholithiasis or dilatation of branching intrahepatic bile ducts is evident.  Liver:  No focal lesion identified. Within normal limits in parenchymal echogenicity. Hepatic veins appear patent. Portal vein is patent with hepatopetal flow.  IVC:  No abnormality visualized.  Pancreas:  Visualized portion unremarkable. Portions of the head and distal tail of the pancreas are obscured by bowel gas.  Spleen:  Size and appearance within normal limits.  Length is 8 cm.  Right Kidney:  Length: Right renal length is 9.7 cm. There is a tiny cyst in the upper pole with greatest diameter of 6.7 mm. There is another cyst in the upper pole. This cyst measures 1.1 x 0.9 x 0.9 cm. There may be a partial tiny thin septation along the edge seen on sagittal image. No solid nodularity is evident. There is cortical thinning. Echogenicity within normal limits. No solid mass or hydronephrosis visualized.  Left Kidney:  Length: Left renal length is 10.6 cm. There is a cystic mass involving the lower pole of the left kidney. This mass measures 1.2 x 1.5 x 1.4 cm. There is cortical thinning. There are few low intensity internal echoes which may reflect debris or artifact. No solid nodularity is evident. Echogenicity within normal limits. No solid mass or hydronephrosis visualized.  Abdominal aorta:  No aneurysm visualized. Maximum diameter is 2.5 cm. There is atherosclerotic plaquing. Distal portion is obscured by overlying bowel gas.  Other findings:  None.  IMPRESSION: Multiple calculi are seen within the gallbladder representing cholelithiasis. No ultrasonic evidence of cholecystitis is seen. Bile ducts appear normal. No hepatic abnormalities are  identified. Small renal cysts are seen. There may be a partial tiny thin septation involving 1 cyst of the right kidney. A few internal echoes are seen involving a small cyst in the left kidney.  Suggest followup ultrasound of the kidneys be obtained in approximately 6 months to assess for stability of the small cystic areas.   Electronically Signed   By: Onalee Hua  Call M.D.   On: 03/21/2013 08:31     Admission HPI: Ms. Sophiana Milanese is a 79 year old woman with PMH of GERD, dCHF, CKD, hyperlipidemia, COPD, a-fib, PVD, hypothyroidism, diverticulosis, and multiple episodes of C diff who presents with a one-day history of fever, chills, weakness, and vomiting x1. She woke up this morning feeling cold and weak, and vomited once after having a few sips of water. Her daughter checked her temperature and found it was 101.4. She usually gets around with a walker, but she felt too weak to use it so her daughter had to help her into her wheelchair to come to the ED.  The patient has a history of multiple episodes of diverticulitis, with the last being in mid-November. She was prescribed a course of Augmentin, which improved her symptoms, she finished just beforeThanksgiving. Towards the end of her course she started having loose stools, and her daughter who is a nurse thought she had developed C diff, so she prescribed her Cipro and Flagyl. They held off starting these antibiotics, however, so she could have a few drinks on Thanksgiving. She developed LLQ pain and had 5-6 loose stools on 03/13/13, however, so she started the Cipro and Flagyl.   The patient felt some heartburn after dinner, so she took a Gaviscon which helped. She ate chicken breast and green beans last night, which is typical food that she normally handles well. She is now having normal bowel movements, with the last one being yesterday. She denies nausea, blood in her vomit, sick contacts, travel, abdominal trauma, or recent falls. She has no history of abdominal surgery.  Chief complaint: generalized weakness and chills   Hospital Course by problem list:  Possible diverticular abscess per CT imaging The patient had an abdominal CT on  admission that suggested a possible sigmoid diverticular abscess. General surgery was consulted and, after reviewing the CT scan with VIR, concluded that the abnormalities were not suggestive of diverticular abscess, but rather a large, thick-walled diverticulum without evidence of inflammation. Given the patient's fever and generalized weakness/chills on admission, and an admission CXR suggestive of a possible pneumonia, she was placed on IV azithromycin 500 mg and IV ceftriaxone 1 g from 03/20/13-03/21/13. Per general surgery recommendations, the patient was switched to IV metronidazole 500 mg q8 hrs from 03/20/13-03/22/13, and IV levofloxacin 750 mg q48 hrs from 03/21/13-03/22/13. The patient was switched to PO metronidazole 500 mg q8 hrs and PO levofloxacin 500 mg q48 hrs on 03/23/13. She will finish a 14-day course of the metronidazole on 04/02/13 and a 14-day course of the levofloxacin on 04/05/13.  Elevated liver enzymes The patient was found to have significantly elevated liver enzymes on admission--AST of 828, ALT of 643, and alkaline phosphatase of 803. Small gallstones were seen on abdominal CT, but there was no sign of cholecystitis and these were essentially unchanged from pervious abdominal CTs from 2012 and 2013. The patient's liver enzymes trended downwards for two days, but then rose again on 03/23/13. A hepatitis panel was done, which found the patient non-reactive for hepatitis A, B, and C antibodies. The patient reported taking  two extra strength Tylenol per day, as well as being started on simvastatin recently, which may have caused the elevated liver enzymes. These medications were discontinued and the patient will follow up with her PCP for further work-up.  Atrial fibrillation The patient has a history of chronic atrial fibrillation and is on long-term warfarin. She was supratherapeutic on admission with an INR of 4.4, so her warfarin was held on the day of admission. The patient's  warfarin was then restarted on 03/14/13 with dosing determined by Pharmacology of 2 mg/3 mg PO on alternate days. The patient was discharged with a near-therapeutic INR of 1.98.  COPD The patient has a history of COPD, GOLD stage C. She is on 2 L of oxygen at home, which she can go off of if she takes it easy, but can't go for extended periods without. The patient has received her flu vaccination this year. She was continued on her home tiotropium 18 mcg inhaler daily, albuterol 2.5 mg nebulizer q4 hrs PRN, and prednisone 10 mg daily. She had access to 2 L of oxygen via nasal canula and reported no difficulty breathing or shortness of breath throughout her hospital stay.  CKD The patient has stage IV CKD, with baseline eGFR in the low 20s and serum creatinine of 1.8-2.1 since March 2013. Her renal function was followed on CMET throughout her hospital stay and did not show evidence of injury or injury.  Diastolic CHF The patient has a history of diastolic CHF. She had two CXRs during her hospital stay which showed no evidence of congestive failure. The patient was continued on her home bisoprolol 5 mg daily and torsemide 80 mg daily.  Hypothyroidism The patient was continued on her home levothyroxine 50 mcg daily. Her TSH was checked and found to be WNL at 0.959.  Discharge Exam: Blood pressure 130/56, pulse 68, temperature 97.8 F (36.6 C), temperature source Oral, resp. rate 18, height 5\' 3"  (1.6 m), weight 196 lb 9.6 oz (89.177 kg), SpO2 100.00%.  General: alert, cooperative, and in no apparent distress HEENT: atraumatic, normocephalic, PERRL, no scleral icterus, moist mucous membranes CV: regular rate, irregular rhythm, no murmurs auscultated, no JVD Pulm: clear to auscultation bilaterally, no wheezes or crackles, normal work of breathing Abd: positive bowel sounds, soft, non-tender and non-distended, negative Murphy's sign Ext: chronic venous stasis in lower extremities bilaterally, 1+  pitting edema in lower extremities bilaterally (patient's baseline), 2+ radial and dorsalis pedis pulses bilaterally Psych: AAOx3   Discharge labs: CBC    Component Value Date/Time   WBC 9.3 03/22/2013 0542   RBC 3.55* 03/22/2013 0542   HGB 10.5* 03/22/2013 0542   HCT 35.7* 03/22/2013 0542   PLT 186 03/22/2013 0542   MCV 100.6* 03/22/2013 0542   MCH 29.6 03/22/2013 0542   MCHC 29.4* 03/22/2013 0542   RDW 16.1* 03/22/2013 0542   LYMPHSABS 0.6* 03/20/2013 1110   MONOABS 0.5 03/20/2013 1110   EOSABS 0.0 03/20/2013 1110   BASOSABS 0.0 03/20/2013 1110   CMP     Component Value Date/Time   NA 135 03/23/2013 1210   K 4.9 03/23/2013 1210   CL 95* 03/23/2013 1210   CO2 26 03/23/2013 1210   GLUCOSE 335* 03/23/2013 1210   BUN 70* 03/23/2013 1210   CREATININE 1.80* 03/23/2013 1210   CREATININE 1.90* 03/23/2012 1621   CALCIUM 9.2 03/23/2013 1210   PROT 6.5 03/23/2013 1210   ALBUMIN 2.9* 03/23/2013 1210   AST 301* 03/23/2013 1210   ALT 380* 03/23/2013  1210   ALKPHOS 890* 03/23/2013 1210   BILITOT 0.6 03/23/2013 1210   GFRNONAA 25* 03/23/2013 1210   GFRAA 29* 03/23/2013 1210      Disposition and follow up: 01-Home or Self Care Ms.ANYELIN MOGLE was discharged from [facility] in Stable condition.    At the hospital follow up visit please address:  1.  Completion of antibiotic course and evaluation of abdominal pain.  2.  Labs / imaging needed at time of follow-up: liver function tests/complete metabolic panel.  3.  Pending labs/ test needing follow-up: none.   Discharge Medications:   Medication List    STOP taking these medications       acetaminophen 500 MG tablet  Commonly known as:  TYLENOL     ciprofloxacin 500 MG tablet  Commonly known as:  CIPRO     simvastatin 40 MG tablet  Commonly known as:  ZOCOR      TAKE these medications       albuterol (2.5 MG/3ML) 0.083% nebulizer solution  Commonly known as:  PROVENTIL  Take 3 mLs  (2.5 mg total) by nebulization 2 (two) times daily. Dx: 492.8 COPD with Emphysema     bisoprolol 10 MG tablet  Commonly known as:  ZEBETA  Take 5 mg by mouth daily.     diphenhydrAMINE 25 MG tablet  Commonly known as:  BENADRYL  Take 25 mg by mouth at bedtime as needed for itching.     guaifenesin 400 MG Tabs tablet  Commonly known as:  HUMIBID E  Take 400 mg by mouth 2 (two) times daily.     levofloxacin 500 MG tablet  Commonly known as:  LEVAQUIN  Take 1 tablet (500 mg total) by mouth every other day.     levothyroxine 50 MCG tablet  Commonly known as:  SYNTHROID, LEVOTHROID  Take 50 mcg by mouth daily.     metroNIDAZOLE 500 MG tablet  Commonly known as:  FLAGYL  Take 1 tablet (500 mg total) by mouth every 8 (eight) hours.     multivitamin with minerals Tabs tablet  Take 1 tablet by mouth daily.     potassium chloride SA 20 MEQ tablet  Commonly known as:  K-DUR,KLOR-CON  Take 20 mEq by mouth daily.     predniSONE 10 MG tablet  Commonly known as:  DELTASONE  Take 10 mg by mouth daily with breakfast.     ranitidine 150 MG capsule  Commonly known as:  ZANTAC  Take 150 mg by mouth every evening.     tiotropium 18 MCG inhalation capsule  Commonly known as:  SPIRIVA HANDIHALER  Place 1 capsule (18 mcg total) into inhaler and inhale daily.     torsemide 20 MG tablet  Commonly known as:  DEMADEX  Take 80 mg by mouth daily.     traMADol 50 MG tablet  Commonly known as:  ULTRAM  Take 0.5 tablets (25 mg total) by mouth every 12 (twelve) hours as needed for moderate pain.     warfarin 2 MG tablet  Commonly known as:  COUMADIN  Take 1 tablet (2 mg total) by mouth daily.         Follow Up Appointments: Follow-up Information   Follow up with [facility]. (home health physical therapy)    Contact information:   [address] [place] [address] [phone]       Schedule an appointment as soon as possible for a visit with Jiles Crocker, PA-C.  Contact information:   [facility] [address] [place], [place] [address] [phone]       Discharge Instructions:  Discharge Orders   Future Appointments Provider Department Dept Phone   03/27/2013 2:00 PM Laurey Morale, MD [facility] [place] Office [phone]   Future Orders Complete By Expires   (HEART FAILURE PATIENTS) Call MD:  Anytime you have any of the following symptoms: 1) 3 pound weight gain in 24 hours or 5 pounds in 1 week 2) shortness of breath, with or without a dry hacking cough 3) swelling in the hands, feet or stomach 4) if you have to sleep on extra pillows at night in order to breathe.  As directed    Call MD for:  extreme fatigue  As directed    Call MD for:  persistant dizziness or light-headedness  As directed    Call MD for:  persistant nausea and vomiting  As directed    Call MD for:  severe uncontrolled pain  As directed    Diet - low sodium heart healthy  As directed    Increase activity slowly  As directed        Signed: Arn Medal Med Student 03/24/2013 07:36 PM

## 2013-03-24 NOTE — Progress Notes (Addendum)
ANTICOAGULATION CONSULT NOTE - Follow Up Consult  Pharmacy Consult for Coumadin Indication: atrial fibrillation  Allergies  Allergen Reactions  . Sulfonamide Derivatives     Childhood reaction    Patient Measurements: Height: 5\' 3"  (160 cm) Weight: 196 lb 9.6 oz (89.177 kg) (Scale A) IBW/kg (Calculated) : 52.4 Heparin Dosing Weight:   Vital Signs: Temp: 97.9 F (36.6 C) (12/14 0700) Temp src: Oral (12/14 0700) BP: 102/46 mmHg (12/14 1033) Pulse Rate: 79 (12/14 1033)  Labs:  Recent Labs  03/22/13 0542 03/23/13 0525 03/23/13 1210 03/24/13 0530  HGB 10.5*  --   --   --   HCT 35.7*  --   --   --   PLT 186  --   --   --   LABPROT 16.2* 16.8*  --  21.9*  INR 1.33 1.40  --  1.98*  CREATININE 2.21* 1.83* 1.80*  --     Estimated Creatinine Clearance: 26.4 ml/min (by C-G formula based on Cr of 1.8).   Medications:  Scheduled:  . albuterol  2.5 mg Nebulization BID  . bisoprolol  5 mg Oral Daily  . heparin subcutaneous  5,000 Units Subcutaneous Q8H  . insulin aspart  0-9 Units Subcutaneous Q4H  . levofloxacin  500 mg Oral Q48H  . levothyroxine  50 mcg Oral QAC breakfast  . metroNIDAZOLE  500 mg Oral Q8H  . predniSONE  10 mg Oral Q breakfast  . sodium chloride  3 mL Intravenous Q12H  . tiotropium  18 mcg Inhalation Daily  . Warfarin - Pharmacist Dosing Inpatient   Does not apply q1800    Assessment: 79yo female admitted with diverticulitis, continuing on Coumadin for AFib.  INR 1.98 this AM.  No CBC today; Hg and pltc previously stable.  Pt is also receiving Heparin SQ and Levofloxacin.  No bleeding noted.  Goal of Therapy:  INR 2-3 Monitor platelets by anticoagulation protocol: Yes   Plan:  Coumadin 2mg  today Plan to d/c Heparin SQ when INR > 2 Watch for increased INR on Levofloxacin and Metronidazole  Marisue Humble, PharmD Clinical Pharmacist [facility]

## 2013-03-24 NOTE — Discharge Summary (Signed)
Name: [name] MRN: [id number] DOB: [birth year] 79 y.o. PCP: Provider Not In System  Date of Admission: 03/20/2013 10:50 AM Date of Discharge: 03/24/2013 Attending Physician: Farley Ly, MD  Discharge Diagnosis: Principal Problem:   Colonic diverticular abscess Active Problems:   HYPOTHYROIDISM   HYPERTENSION, UNSPECIFIED   Atrial fibrillation   DIASTOLIC HEART FAILURE, CHRONIC   COPD with emphysema   Osteoarthritis   Transaminitis   Generalized weakness   Chronic kidney disease (CKD), stage IV   Discharge Medications:   Medication List    STOP taking these medications       acetaminophen 500 MG tablet  Commonly known as:  TYLENOL     ciprofloxacin 500 MG tablet  Commonly known as:  CIPRO     simvastatin 40 MG tablet  Commonly known as:  ZOCOR      TAKE these medications       albuterol (2.5 MG/3ML) 0.083% nebulizer solution  Commonly known as:  PROVENTIL  Take 3 mLs (2.5 mg total) by nebulization 2 (two) times daily. Dx: 492.8 COPD with Emphysema     bisoprolol 10 MG tablet  Commonly known as:  ZEBETA  Take 5 mg by mouth daily.     diphenhydrAMINE 25 MG tablet  Commonly known as:  BENADRYL  Take 25 mg by mouth at bedtime as needed for itching.     guaifenesin 400 MG Tabs tablet  Commonly known as:  HUMIBID E  Take 400 mg by mouth 2 (two) times daily.     levofloxacin 500 MG tablet  Commonly known as:  LEVAQUIN  Take 1 tablet (500 mg total) by mouth every other day.     levothyroxine 50 MCG tablet  Commonly known as:  SYNTHROID, LEVOTHROID  Take 50 mcg by mouth daily.     metroNIDAZOLE 500 MG tablet  Commonly known as:  FLAGYL  Take 1 tablet (500 mg total) by mouth every 8 (eight) hours.     multivitamin with minerals Tabs tablet  Take 1 tablet by mouth daily.     potassium chloride SA 20 MEQ tablet  Commonly known as:  K-DUR,KLOR-CON  Take 20 mEq by mouth daily.     predniSONE 10 MG tablet  Commonly known as:  DELTASONE    Take 10 mg by mouth daily with breakfast.     ranitidine 150 MG capsule  Commonly known as:  ZANTAC  Take 150 mg by mouth every evening.     tiotropium 18 MCG inhalation capsule  Commonly known as:  SPIRIVA HANDIHALER  Place 1 capsule (18 mcg total) into inhaler and inhale daily.     torsemide 20 MG tablet  Commonly known as:  DEMADEX  Take 80 mg by mouth daily.     traMADol 50 MG tablet  Commonly known as:  ULTRAM  Take 0.5 tablets (25 mg total) by mouth every 12 (twelve) hours as needed for moderate pain.     warfarin 2 MG tablet  Commonly known as:  COUMADIN  Take 1 tablet (2 mg total) by mouth daily.        Disposition and follow-up:   Ms.[name] was discharged from [facility] in Stable condition.  At the hospital follow up visit please address:  1.  Elevated liver enzymes, resolution of abdominal pain  2.  Labs / imaging needed at time of follow-up: LFT's, PT/INR  3.  Pending labs/ test needing follow-up: None  Follow-up Appointments:  Follow-up Information   Follow up with [facility]. (home health physical therapy)    Contact information:   [address] [place] [address] [phone]       Schedule an appointment as soon as possible for a visit with Jiles Crocker, PA-C.   Contact information:   [facility] [address] [place], [place] [address] [phone]      Discharge Instructions: Discharge Orders   Future Appointments Provider Department Dept Phone   03/27/2013 2:00 PM Laurey Morale, MD [facility] [place] Office [phone]   Future Orders Complete By Expires   (HEART FAILURE PATIENTS) Call MD:  Anytime you have any of the following symptoms: 1) 3 pound weight gain in 24 hours or 5 pounds in 1 week 2) shortness of breath, with or without a dry hacking cough 3) swelling in the hands, feet or stomach 4) if you have to sleep on extra pillows at night in order to  breathe.  As directed    Call MD for:  extreme fatigue  As directed    Call MD for:  persistant dizziness or light-headedness  As directed    Call MD for:  persistant nausea and vomiting  As directed    Call MD for:  severe uncontrolled pain  As directed    Diet - low sodium heart healthy  As directed    Increase activity slowly  As directed       Consultations:    Procedures Performed:  Ct Abdomen Pelvis Wo Contrast  03/20/2013   CLINICAL DATA:  Nausea.  Fever.  Fatigue.  Abdominal pain.  EXAM: CT ABDOMEN AND PELVIS WITHOUT CONTRAST  TECHNIQUE: Multidetector CT imaging of the abdomen and pelvis was performed following the standard protocol without intravenous contrast.  COMPARISON:  04/18/2011  FINDINGS: Prominent epicardial adipose tissue. Mild atelectasis in the left lower lobe. Prior bilateral pleural effusions have resolved. There is cardiomegaly along with calcification of the aortic and mitral valves, and coronary artery atherosclerotic calcification.  Prominent lateral segment left hepatic lobe. Small hypodensity in segment 3 along the falciform ligament likely represents focal fatty infiltration. Spleen and adrenal glands unremarkable in non-contrast CT appearance. Pancreas unremarkable.  Dependent abnormal density in the gallbladder may represent layering gallstones or sludge. Gallbladder moderately distended at 6 cm in diameter.  Stable 1.1 cm exophytic lesion of the posterior right mid kidney. Bilateral renal atrophy. Vascular calcifications along the left renal hilum.  No hydronephrosis or ureteral calculus. Several small bladder cellules are present.  Appendix normal. Scattered air-fluid levels are in the nondilated proximal loops of small bowel.  Aortoiliac atherosclerotic calcification noted do the  There is a 6.6 by 3.6 by 4.0 cm abscess along the posterior margin of the sigmoid colon shown on images 63-72 of series 2. This is adjacent to the left adnexa and contains fluid, gas, and  stool like material. It may be continuous with the colon. This abscess extends back towards the left pelvic sidewall and obturator internus muscle, and is adjacent to the left external iliac vasculature. This likely represents a diverticular abscess.  Solid interbody fusion noted at L4-5. Severe loss of disc height at L3-4 with posterior osseous ridging and facet arthropathy. No lumbar spine fracture or acute subluxation.  IMPRESSION: 1. Sigmoid diverticular abscess extends posterolaterally from the sigmoid colon on the left towards the left pelvic sidewall, and contains gas, fluid, and stool like material. Uncertain whether this is connected/draining into the sigmoid colon.  The extraluminal gas appears walled off and is not free within the peritoneal space. 2. Colonic diverticulosis. 3. Sludge versus cholelithiasis in the borderline distended gallbladder. 4. Atherosclerosis. 5. Prominent lateral segment left hepatic lobe, which can be an early sign of cirrhotic liver disease. 6. Bilateral renal atrophy.   Electronically Signed   By: Herbie Baltimore M.D.   On: 03/20/2013 15:57   Dg Chest 2 View  03/21/2013   CLINICAL DATA:  Pneumonia.  EXAM: CHEST  2 VIEW  COMPARISON:  03/20/2013  FINDINGS: The cardiac silhouette remains enlarged, unchanged. The patient is rotated on the lateral image. Mild retrocardiac opacity remains. The right lung remains grossly clear. No definite pleural effusion is identified. No acute osseous abnormality is identified.  IMPRESSION: Mild retrocardiac opacity, which may reflect atelectasis. Right lung remains grossly clear.   Electronically Signed   By: Sebastian Ache   On: 03/21/2013 08:27   Dg Chest 2 View  03/20/2013   CLINICAL DATA:  Nausea and fatigue.  Fever.  EXAM: CHEST  2 VIEW  COMPARISON:  Two-view chest 04/20/2012.  FINDINGS: The heart is enlarged. Patient is rotated on the lateral view. A left pleural effusion is suspected. Left basilar airspace disease is present as well.   IMPRESSION: 1. Asymmetric left pleural effusion and airspace disease is concerning for pneumonia. 2. Stable cardiomegaly without failure.   Electronically Signed   By: Gennette Pac M.D.   On: 03/20/2013 12:23   US Abdomen Complete  03/21/2013   CLINICAL DATA:  History of abnormal liver function tests. History of hypertension and diabetes.  EXAM: ULTRASOUND ABDOMEN COMPLETE  COMPARISON:  CT 03/20/2013.  FINDINGS: Gallbladder:  Multiple small gallstones are seen in the gallbladder representing cholelithiasis. The largest calculus or clump of calculi has greatest diameter of 2.1 cm. There is some sludge within the gallbladder. No gallbladder wall thickening visualized. Gallbladder wall thickness measures 2.7 mm. No pericholecystic fluid is evident. No positive sonographic Murphy sign noted.  Common bile duct:  Diameter: 5.8 mm. No choledocholithiasis or dilatation of branching intrahepatic bile ducts is evident.  Liver:  No focal lesion identified. Within normal limits in parenchymal echogenicity. Hepatic veins appear patent. Portal vein is patent with hepatopetal flow.  IVC:  No abnormality visualized.  Pancreas:  Visualized portion unremarkable. Portions of the head and distal tail of the pancreas are obscured by bowel gas.  Spleen:  Size and appearance within normal limits.  Length is 8 cm.  Right Kidney:  Length: Right renal length is 9.7 cm. There is a tiny cyst in the upper pole with greatest diameter of 6.7 mm. There is another cyst in the upper pole. This cyst measures 1.1 x 0.9 x 0.9 cm. There may be a partial tiny thin septation along the edge seen on sagittal image. No solid nodularity is evident. There is cortical thinning. Echogenicity within normal limits. No solid mass or hydronephrosis visualized.  Left Kidney:  Length: Left renal length is 10.6 cm. There is a cystic mass involving the lower pole of the left kidney. This mass measures 1.2 x 1.5 x 1.4 cm. There is cortical thinning. There are few  low intensity internal echoes which may reflect debris or artifact. No solid nodularity is evident. Echogenicity within normal limits. No solid mass or hydronephrosis visualized.  Abdominal aorta:  No aneurysm visualized. Maximum diameter is 2.5 cm. There is atherosclerotic plaquing. Distal portion is obscured by overlying bowel gas.  Other findings:  None.  IMPRESSION: Multiple calculi are seen  within the gallbladder representing cholelithiasis. No ultrasonic evidence of cholecystitis is seen. Bile ducts appear normal. No hepatic abnormalities are identified. Small renal cysts are seen. There may be a partial tiny thin septation involving 1 cyst of the right kidney. A few internal echoes are seen involving a small cyst in the left kidney. Suggest followup ultrasound of the kidneys be obtained in approximately 6 months to assess for stability of the small cystic areas.   Electronically Signed   By: Onalee Hua  Call M.D.   On: 03/21/2013 08:31    2D Echo: None  Cardiac Cath: None  Admission HPI: Ms. Maddie Brazier is a 79 year old woman with PMH of GERD, dCHF, CKD, hyperlipidemia, COPD, a-fib, PVD, hypothyroidism, diverticulosis, and multiple episodes of C diff who presents with a one-day history of fever, chills, weakness, and vomiting x1. She woke up this morning feeling cold and weak, and vomited once after having a few sips of water. Her daughter checked her temperature and found it was 101.4. She usually gets around with a walker, but she felt too weak to use it so her daughter had to help her into her wheelchair to come to the ED.  The patient has a history of multiple episodes of diverticulitis, with the last being in mid-November. She was prescribed a course of Augmentin, which improved her symptoms, she finished just beforeThanksgiving. Towards the end of her course she started having loose stools, and her daughter who is a nurse thought she had developed C diff, so she prescribed her Cipro and Flagyl.  They held off starting these antibiotics, however, so she could have a few drinks on Thanksgiving. She developed LLQ pain and had 5-6 loose stools on 03/13/13, however, so she started the Cipro and Flagyl.  The patient felt some heartburn after dinner, so she took a Gaviscon which helped. She ate chicken breast and green beans last night, which is typical food that she normally handles well. She is now having normal bowel movements, with the last one being yesterday. She denies nausea, blood in her vomit, sick contacts, travel, abdominal trauma, or recent falls. She has no history of abdominal surgery.   Hospital Course by problem list: Principal Problem:   Colonic diverticular abscess Active Problems:   HYPOTHYROIDISM   HYPERTENSION, UNSPECIFIED   Atrial fibrillation   DIASTOLIC HEART FAILURE, CHRONIC   COPD with emphysema   Osteoarthritis   Transaminitis   Generalized weakness   Chronic kidney disease (CKD), stage IV    Possible diverticular abscess per CT imaging  The patient had an abdominal CT on admission that suggested a possible sigmoid diverticular abscess. General surgery was consulted and, after reviewing the CT scan with VIR, concluded that the abnormalities were not suggestive of diverticular abscess, but rather a large, thick-walled diverticulum without evidence of inflammation. Given the patient's fever and generalized weakness/chills on admission, and an admission CXR suggestive of a possible pneumonia, she was placed on IV azithromycin 500 mg and IV ceftriaxone 1 g from 03/20/13-03/21/13. Per general surgery recommendations, the patient was switched to IV metronidazole 500 mg q8 hrs from 03/20/13-03/22/13, and IV levofloxacin 750 mg q48 hrs from 03/21/13-03/22/13. The patient was switched to PO metronidazole 500 mg q8 hrs and PO levofloxacin 500 mg q48 hrs on 03/23/13. She will finish a 14-day course of the metronidazole on 04/02/13 and a 14-day course of the levofloxacin on  04/05/13.  Elevated liver enzymes  The patient was found to have significantly elevated liver enzymes on admission--AST of  828, ALT of 643, and alkaline phosphatase of 803. Small gallstones were seen on abdominal CT, but there was no sign of cholecystitis and these were essentially unchanged from pervious abdominal CTs from 2012 and 2013. The patient's liver enzymes trended downwards for two days, but then rose again on 03/23/13. A hepatitis panel was done, which found the patient non-reactive for hepatitis A, B, and C antibodies. The patient reported taking two extra strength Tylenol per day, as well as being started on simvastatin recently, which may have caused the elevated liver enzymes. These medications were discontinued and the patient will follow up with her PCP for further work-up.  Atrial fibrillation  The patient has a history of chronic atrial fibrillation and is on long-term warfarin. She was supratherapeutic on admission with an INR of 4.4, so her warfarin was held on the day of admission. The patient's warfarin was then restarted on 03/14/13 with dosing determined by Pharmacology of 2 mg/3 mg PO on alternate days. The patient was discharged with a near-therapeutic INR of 1.98.  COPD  The patient has a history of COPD. She is on 2 L of oxygen at home, which she can go off of if she takes it easy, but can't go for extended periods without. The patient has received her flu vaccination this year. She was continued on her home tiotropium 18 mcg inhaler daily, albuterol 2.5 mg nebulizer q4 hrs PRN, and prednisone 10 mg daily. She had access to 2 L of oxygen via nasal canula and reported no difficulty breathing or shortness of breath throughout her hospital stay.  CKD  The patient has stage IV CKD, with baseline eGFR in the low 20s and serum creatinine of 1.8-2.1 since March 2013. Her renal function was followed on CMET throughout her hospital stay and did not show evidence of injury or injury.   Diastolic CHF  The patient has a history of diastolic CHF. She had two CXRs during her hospital stay which showed no evidence of congestive failure. The patient was continued on her home bisoprolol 5 mg daily and torsemide 80 mg daily.  Hypothyroidism  The patient was continued on her home levothyroxine 50 mcg daily. Her TSH was checked and found to be WNL at 0.959.   Discharge Vitals:   BP 130/56  Pulse 68  Temp(Src) 97.8 F (36.6 C) (Oral)  Resp 18  Ht 5\' 3"  (1.6 m)  Wt 89.177 kg (196 lb 9.6 oz)  BMI 34.83 kg/m2  SpO2 100%  Discharge Labs:  Results for orders placed during the hospital encounter of 03/20/13 (from the past 24 hour(s))  GLUCOSE, CAPILLARY     Status: Abnormal   Collection Time    03/23/13  7:48 PM      Result Value Range   Glucose-Capillary 286 (*) 70 - 99 mg/dL   Comment 1 Notify RN     Comment 2 Documented in Chart    GLUCOSE, CAPILLARY     Status: Abnormal   Collection Time    03/23/13 11:59 PM      Result Value Range   Glucose-Capillary 143 (*) 70 - 99 mg/dL   Comment 1 Notify RN     Comment 2 Documented in Chart    GLUCOSE, CAPILLARY     Status: Abnormal   Collection Time    03/24/13  5:21 AM      Result Value Range   Glucose-Capillary 127 (*) 70 - 99 mg/dL   Comment 1 Notify RN  Comment 2 Documented in Chart    PROTIME-INR     Status: Abnormal   Collection Time    03/24/13  5:30 AM      Result Value Range   Prothrombin Time 21.9 (*) 11.6 - 15.2 seconds   INR 1.98 (*) 0.00 - 1.49  TSH     Status: None   Collection Time    03/24/13  5:30 AM      Result Value Range   TSH 0.959  0.350 - 4.500 uIU/mL  GLUCOSE, CAPILLARY     Status: Abnormal   Collection Time    03/24/13  7:51 AM      Result Value Range   Glucose-Capillary 149 (*) 70 - 99 mg/dL   Comment 1 Documented in Chart     Comment 2 Notify RN    GAMMA GT     Status: Abnormal   Collection Time    03/24/13  8:24 AM      Result Value Range   GGT 1533 (*) 7 - 51 U/L  GLUCOSE,  CAPILLARY     Status: Abnormal   Collection Time    03/24/13 12:11 PM      Result Value Range   Glucose-Capillary 287 (*) 70 - 99 mg/dL   Comment 1 Documented in Chart     Comment 2 Notify RN    GLUCOSE, CAPILLARY     Status: Abnormal   Collection Time    03/24/13  4:29 PM      Result Value Range   Glucose-Capillary 237 (*) 70 - 99 mg/dL   Comment 1 Documented in Chart     Comment 2 Notify RN      Signed: Boykin Peek, MD 03/24/2013, 5:55 PM   Time Spent on Discharge: 45 minutes Services Ordered on Discharge: None Equipment Ordered on Discharge: None

## 2013-03-24 NOTE — Discharge Summary (Signed)
I repeated the critical or key portions of the exam.  I confirmed/revised the medical student's history, exam, assessment and plan.   

## 2013-03-24 NOTE — Progress Notes (Signed)
Subjective:  Pt reports pain is better this AM.  She states the pain is dull, diffuse and then settles in her LL quadrant.  It completely resolves with 2mg  morphine IV.  She denies that it changes with position and is not related to food.  She reports it feels like her diverticulitis pain she has experienced in the past.  She also states that passing gas makes it feel better.  She had a BM a couple of days ago.  She was able to tolerate a clear liquid diet yesterday without N/V.  She denies any fever/chills, SOB, or CP.  She has been up walking and walked to the nursing station with PT on Friday.  She slept well last PM.   Objective: Vital signs in last 24 hours: Filed Vitals:   03/23/13 0816 03/23/13 1400 03/23/13 2004 03/24/13 0700  BP:  126/52 150/82 134/61  Pulse:  75 73 76  Temp:  98 F (36.7 C) 97.4 F (36.3 C) 97.9 F (36.6 C)  TempSrc:  Oral Oral Oral  Resp:  18 18 18   Height:      Weight:    89.177 kg (196 lb 9.6 oz)  SpO2: 97% 99% 97% 100%   Weight change: -0.223 kg (-7.9 oz)  Intake/Output Summary (Last 24 hours) at 03/24/13 0721 Last data filed at 03/24/13 1610  Gross per 24 hour  Intake    720 ml  Output   1400 ml  Net   -680 ml   Constitutional: Vital signs reviewed.  Patient is a well-developed and well-nourished female in no acute distress and cooperative with exam.  She is sitting up in her chair.   Head: Normocephalic and atraumatic Eyes: EOMI, No scleral icterus.  Neck: Supple, Trachea midline .  Cardiovascular: RRR, S1 normal, S2 normal, no MRG, pulses symmetric and intact bilaterally Pulmonary/Chest: normal respiratory effort, CTAB Abdominal: Soft. Obese. Non-tender, non-distended, bowel sounds are normal, no masses, organomegaly, or guarding present.  Neurological: cranial nerve II-XII are grossly intact, moving all extremities without difficulty Skin: Warm, dry and intact. She has chronic venous stasis b/l on her lower extremities; no signs of  infection Psychiatric: Normal mood and affect.     Lab Results: Basic Metabolic Panel:  Recent Labs Lab 03/23/13 0525 03/23/13 1210  NA 138 135  K 4.3 4.9  CL 96 95*  CO2 26 26  GLUCOSE 217* 335*  BUN 74* 70*  CREATININE 1.83* 1.80*  CALCIUM 9.4 9.2   Liver Function Tests:  Recent Labs Lab 03/23/13 0525 03/23/13 1210  AST 430* 301*  ALT 381* 380*  ALKPHOS 880* 890*  BILITOT 1.3* 0.6  PROT 6.3 6.5  ALBUMIN 2.8* 2.9*    Recent Labs Lab 03/20/13 1323  LIPASE 23   No results found for this basename: AMMONIA,  in the last 168 hours CBC:  Recent Labs Lab 03/20/13 1110 03/21/13 0830 03/22/13 0542  WBC 11.3* 8.5 9.3  NEUTROABS 10.2*  --   --   HGB 12.0 10.4* 10.5*  HCT 38.9 34.1* 35.7*  MCV 98.7 100.3* 100.6*  PLT 205 186 186   Cardiac Enzymes: No results found for this basename: CKTOTAL, CKMB, CKMBINDEX, TROPONINI,  in the last 168 hours BNP: No results found for this basename: PROBNP,  in the last 168 hours D-Dimer: No results found for this basename: DDIMER,  in the last 168 hours CBG:  Recent Labs Lab 03/22/13 0817 03/23/13 0656 03/23/13 1211 03/23/13 1702 03/23/13 1948  GLUCAP 214* 240* 314*  294* 286*   Hemoglobin A1C: No results found for this basename: HGBA1C,  in the last 168 hours Fasting Lipid Panel: No results found for this basename: CHOL, HDL, LDLCALC, TRIG, CHOLHDL, LDLDIRECT,  in the last 168 hours Thyroid Function Tests: No results found for this basename: TSH, T4TOTAL, FREET4, T3FREE, THYROIDAB,  in the last 168 hours Coagulation:  Recent Labs Lab 03/21/13 0512 03/22/13 0542 03/23/13 0525 03/24/13 0530  LABPROT 20.7* 16.2* 16.8* 21.9*  INR 1.84* 1.33 1.40 1.98*   Anemia Panel: No results found for this basename: VITAMINB12, FOLATE, FERRITIN, TIBC, IRON, RETICCTPCT,  in the last 168 hours Urine Drug Screen: Drugs of Abuse  No results found for this basename: labopia,  cocainscrnur,  labbenz,  amphetmu,  thcu,   labbarb    Alcohol Level: No results found for this basename: ETH,  in the last 168 hours Urinalysis:  Recent Labs Lab 03/20/13 1215  COLORURINE YELLOW  LABSPEC 1.011  PHURINE 5.0  GLUCOSEU NEGATIVE  HGBUR MODERATE*  BILIRUBINUR NEGATIVE  KETONESUR NEGATIVE  PROTEINUR NEGATIVE  UROBILINOGEN 0.2  NITRITE NEGATIVE  LEUKOCYTESUR SMALL*    Micro Results: Recent Results (from the past 240 hour(s))  URINE CULTURE     Status: None   Collection Time    03/20/13 12:15 PM      Result Value Range Status   Specimen Description URINE, CATHETERIZED   Final   Special Requests NONE   Final   Culture  Setup Time     Final   Value: 03/20/2013 00:00     Performed at [facility] Count     Final   Value: NO GROWTH     Performed at [facility]   Culture     Final   Value: NO GROWTH     Performed at [facility]   Report Status 03/21/2013 FINAL   Final  CULTURE, BLOOD (ROUTINE X 2)     Status: None   Collection Time    03/20/13  6:15 PM      Result Value Range Status   Specimen Description BLOOD HAND RIGHT   Final   Special Requests BOTTLES DRAWN AEROBIC ONLY 10CC   Final   Culture  Setup Time     Final   Value: 03/21/2013 00:06     Performed at [facility]   Culture     Final   Value:        BLOOD CULTURE RECEIVED NO GROWTH TO DATE CULTURE WILL BE HELD FOR 5 DAYS BEFORE ISSUING A FINAL NEGATIVE REPORT     Performed at [facility]   Report Status PENDING   Incomplete  CULTURE, BLOOD (ROUTINE X 2)     Status: None   Collection Time    03/20/13  6:20 PM      Result Value Range Status   Specimen Description BLOOD ARM LEFT   Final   Special Requests BOTTLES DRAWN AEROBIC AND ANAEROBIC 10CC   Final   Culture  Setup Time     Final   Value: 03/21/2013 00:05     Performed at [facility]   Culture     Final   Value:        BLOOD CULTURE RECEIVED NO GROWTH TO DATE CULTURE WILL BE HELD FOR 5 DAYS BEFORE ISSUING A FINAL  NEGATIVE REPORT     Performed at [facility]   Report Status PENDING   Incomplete  MRSA PCR SCREENING  Status: Abnormal   Collection Time    03/21/13  1:17 PM      Result Value Range Status   MRSA by PCR POSITIVE (*) NEGATIVE Final   Comment:            The GeneXpert MRSA Assay (FDA     approved for NASAL specimens     only), is one component of a     comprehensive MRSA colonization     surveillance program. It is not     intended to diagnose MRSA     infection nor to guide or     monitor treatment for     MRSA infections.     RESULT CALLED TO, READ BACK BY AND VERIFIED WITH:     Windle Guard RN 15:30 03/21/13 ([email or web address])   Studies/Results: No results found. Medications: I have reviewed the patient's current medications. Scheduled Meds: . albuterol  2.5 mg Nebulization BID  . bisoprolol  5 mg Oral Daily  . heparin subcutaneous  5,000 Units Subcutaneous Q8H  . insulin aspart  0-9 Units Subcutaneous Q4H  . levofloxacin  500 mg Oral Q48H  . levothyroxine  50 mcg Oral QAC breakfast  . metroNIDAZOLE  500 mg Oral Q8H  . predniSONE  10 mg Oral Q breakfast  . sodium chloride  3 mL Intravenous Q12H  . tiotropium  18 mcg Inhalation Daily  . Warfarin - Pharmacist Dosing Inpatient   Does not apply q1800   Continuous Infusions:  PRN Meds:.albuterol, morphine injection Assessment/Plan: Principal Problem:   Colonic diverticular abscess Active Problems:   HYPOTHYROIDISM   HYPERTENSION, UNSPECIFIED   Atrial fibrillation   DIASTOLIC HEART FAILURE, CHRONIC   COPD with emphysema   Osteoarthritis   Transaminitis   Generalized weakness   Chronic kidney disease (CKD), stage IV    Possible Diverticular Abscess per CT imaging  Surgery, VIR and radiology in agreement that abnormalities seen on abdominal CT not suggestive of diverticular abscess (no evidence of inflammation), but rather redundant colon. Symptoms have resolved, but their original cause still not certain. May be due to  pneumonia given crackles on exam and admission CXR findings, however there is no cough, chest pain, sputum production. Met 3/4 SIRS criteria in ED (Tmax 102.8, RR >20, HR >100), and has elevated lactic acid.  We started IV metronidazole 500 mg q8 hrs on 12/10 and IV Levoquin 750 mg q48 hrs on 12/11 (d/t renal dosing).  She received IV aziothromycin and Rocephin (03/20/13-03/20/13) in the ED.  BC have been NGTD.  Spoke with Dr. Gerrit Friends of [facility] and is not recommending any intervention at this time and recommends conservative treatment with antibiotics for a total of 14 days.  He would suggest further testing if she deteriorates but otherwise surgery will sign off.  Pt is on po levaquin and metronidazole. Pt is still on clear liquids and has been tolerating well.  She states her pain is 0/10 with IV morphine.  She denies any N/V, fever/chills.   -start full liquids today; will try regular diet for lunch if pt tolerates full liquids -possible d/c today -home health PT  Elevated liver enzymes  Resolving. All trending down.  Possibly due to sepsis. Possibly due to NASH (fatty infiltrate seen on abdominal CT). Possibly due to cholelithiasis, although abdominal US showed no signs of cholecystitis. Viral hepatitis is a possibility, but liver enzymes were normal in Feb 2013 making Hep B/C unlikely, and the patient has no history of travel (Hep A/E). Congestive hepatopathy is  possible but no signs of CHF exacerbation (weight is stable, no evidence on CXR). Hepatic venous outflow obstruction not seen on abdominal CT.    03/24/13: Pt reports that she drinks alcohol occasionally, maybe 1x month.  She also has been taking zocor and also reports taking tylenol everyday.  The combination of zocor, tylenol, and occasional alcohol is most likely the etiology of her elevated liver enzymes.  She is not known to be on any other medication that could account for her liver enzyme elevation.   -GGT  -Hepatitis panel -Follow enzymes  as an outpatient -Hold Tylenol and simvastatin   A-fib  Chronic a-fib, has failed cardioversion w/ TEE guidance in May 2010. The patient is on long-term warfarin. Supratherapeutic on admission with INR of 4.4, was in therapeutic range of 2.7 on 02/19/13. Warfarin held last night and INR now slightly sub-therapeutic at 1.84. Admitted to telemetry.  -Coumadin per pharmacy  -Daily PT/INR check   Lab Results  Component Value Date   INR 1.98* 03/24/2013   INR 1.40 03/23/2013   INR 1.33 03/22/2013    COPD  Stable. GOLD stage C on 2 liters O2 at home. Can go off of O2 while going out if she takes it easy, but can't go for extended periods without it. Has received flu vaccine this year.  -Continue home tiotropium 18 mcg inhaler daily  -Continue home albuterol 2.5 mg neb q4 hrs PRN  -2 L O2 nasal canula PRN   CKD  Stable. Baseline creatinine of 1.8-2.1 since March 2013. Creatinine down to 2.09 from 2.15 on admission. 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  -Monitor   Diastolic CHF  -Continue home bisoprolol 5 mg daily  -Torsemide 80 mg daily   Hypothyroidism  -check TSH pending -Continue home levothyroxine 50 mcg daily    Dispo:  Anticipated discharge today.  The patient does have a current PCP (Provider Not In System) and does need an [facility] hospital follow-up appointment after discharge.  .Services Needed at time of discharge: Y = Yes, Blank = No PT:   OT:   RN:   Equipment:   Other:     LOS: 4 days   Boykin Peek, MD 03/24/2013, 7:21 AM

## 2013-03-27 ENCOUNTER — Ambulatory Visit: Payer: Medicare Other | Admitting: Cardiology

## 2013-03-27 LAB — CULTURE, BLOOD (ROUTINE X 2): Culture: NO GROWTH

## 2013-04-15 ENCOUNTER — Other Ambulatory Visit: Payer: Self-pay | Admitting: *Deleted

## 2013-04-15 MED ORDER — ALBUTEROL SULFATE (2.5 MG/3ML) 0.083% IN NEBU: 2.5000 mg | INHALATION_SOLUTION | Freq: Two times a day (BID) | RESPIRATORY_TRACT | Status: AC

## 2013-04-26 ENCOUNTER — Ambulatory Visit: Payer: Medicare Other | Admitting: Cardiology

## 2013-05-20 ENCOUNTER — Ambulatory Visit (INDEPENDENT_AMBULATORY_CARE_PROVIDER_SITE_OTHER): Payer: Medicare Other | Admitting: *Deleted

## 2013-05-20 ENCOUNTER — Encounter: Payer: Self-pay | Admitting: Cardiology

## 2013-05-20 ENCOUNTER — Ambulatory Visit (INDEPENDENT_AMBULATORY_CARE_PROVIDER_SITE_OTHER): Payer: Medicare Other | Admitting: Cardiology

## 2013-05-20 VITALS — BP 124/64 | HR 94 | Ht 63.0 in | Wt 188.0 lb

## 2013-05-20 DIAGNOSIS — E785 Hyperlipidemia, unspecified: Secondary | ICD-10-CM

## 2013-05-20 DIAGNOSIS — J438 Other emphysema: Secondary | ICD-10-CM

## 2013-05-20 DIAGNOSIS — N189 Chronic kidney disease, unspecified: Secondary | ICD-10-CM

## 2013-05-20 DIAGNOSIS — I4891 Unspecified atrial fibrillation: Secondary | ICD-10-CM

## 2013-05-20 DIAGNOSIS — J439 Emphysema, unspecified: Secondary | ICD-10-CM

## 2013-05-20 DIAGNOSIS — I5032 Chronic diastolic (congestive) heart failure: Secondary | ICD-10-CM

## 2013-05-20 LAB — CBC WITH DIFFERENTIAL/PLATELET
BASOS PCT: 0.2 % (ref 0.0–3.0)
Basophils Absolute: 0 10*3/uL (ref 0.0–0.1)
EOS PCT: 1.4 % (ref 0.0–5.0)
Eosinophils Absolute: 0.2 10*3/uL (ref 0.0–0.7)
HCT: 38.1 % (ref 36.0–46.0)
Hemoglobin: 12.1 g/dL (ref 12.0–15.0)
LYMPHS ABS: 1.2 10*3/uL (ref 0.7–4.0)
Lymphocytes Relative: 11 % — ABNORMAL LOW (ref 12.0–46.0)
MCHC: 31.9 g/dL (ref 30.0–36.0)
MCV: 89.8 fl (ref 78.0–100.0)
Monocytes Absolute: 0.6 10*3/uL (ref 0.1–1.0)
Monocytes Relative: 5.7 % (ref 3.0–12.0)
Neutro Abs: 9.2 10*3/uL — ABNORMAL HIGH (ref 1.4–7.7)
Neutrophils Relative %: 81.7 % — ABNORMAL HIGH (ref 43.0–77.0)
Platelets: 318 10*3/uL (ref 150.0–400.0)
RBC: 4.24 Mil/uL (ref 3.87–5.11)
RDW: 14.9 % — ABNORMAL HIGH (ref 11.5–14.6)
WBC: 11.3 10*3/uL — AB (ref 4.5–10.5)

## 2013-05-20 LAB — HEPATIC FUNCTION PANEL
ALBUMIN: 3 g/dL — AB (ref 3.5–5.2)
ALT: 16 U/L (ref 0–35)
AST: 14 U/L (ref 0–37)
Alkaline Phosphatase: 77 U/L (ref 39–117)
Bilirubin, Direct: 0 mg/dL (ref 0.0–0.3)
TOTAL PROTEIN: 6.6 g/dL (ref 6.0–8.3)
Total Bilirubin: 0.6 mg/dL (ref 0.3–1.2)

## 2013-05-20 LAB — POCT INR: INR: 2.1

## 2013-05-20 LAB — BASIC METABOLIC PANEL
BUN: 52 mg/dL — AB (ref 6–23)
CHLORIDE: 96 meq/L (ref 96–112)
CO2: 26 meq/L (ref 19–32)
Calcium: 8.9 mg/dL (ref 8.4–10.5)
Creatinine, Ser: 1.8 mg/dL — ABNORMAL HIGH (ref 0.4–1.2)
GFR: 29.46 mL/min — ABNORMAL LOW (ref >60.00)
Glucose, Bld: 189 mg/dL — ABNORMAL HIGH (ref 70–99)
Potassium: 3.9 mEq/L (ref 3.5–5.1)
Sodium: 135 mEq/L (ref 135–145)

## 2013-05-20 NOTE — Progress Notes (Signed)
Patient ID: [name], female   DOB: [birth year], 80 y.o.   MRN: [id number] PCP: Esau Grew in [place].  80 yo with history of atrial fibrillation, COPD, CKD and chronic diastolic CHF presents for cardiology followup. She was admitted 03/21/12 to 05/24/12 at [facility] for diverticulitis and had a prolonged hospitalization involving intubation, etc.  Since then, she has been splitting time between her daughter's house in [place] and her son in [place].  She now appears to be in permanent atrial fibrillation.  She is on home oxygen for her COPD.    Since last appointment, she has been doing well.  Creatinine has been stable.  Weight is down 15 lbs.  Her son and his wife have been on a diet and she has been following that diet while she is living with them.  She is able to walk around her house without dyspnea with her walker though she gets short of breath with longer distances.  She had 1 mechanical fall since last appointment.  Finally, she was in the hospital in 12/14 for a fever and possible diverticulitis.  He LFTs were very high and her statin was stopped.  She is seeing Dr. Eliott Nine now for nephrology.followup.   ECG: atrial fibrillation, right axis deviation, low voltage.   Labs (8/10): K 4.2, creatinine 2.8  Labs (11/10): creatinine 2.39, TGs 558, HDL 23  Labs (4/11): creatinine 2.05, K 4.4, LFTs normal, TGs 406, HDL 23  Labs (12/11): creatinine 2.5, K 3.9, LFTs normal, HCT 31.3  Labs (11/13): K 4.1, creatinine 1.8, BNP 603 Labs (12/13): K 4.6, creatinine 1.9, BNP 304 Labs (1/14): K 4.3, creatinine 2.3, BUN 101 Labs (2/14): K 4.5, creatinine 1.9 Labs (4/14): K 3.7, creatinine 2.1, BNP 399, LDL 50, HDL 21 Labs (5/14): K 3.8, BUN 81, creatinine 2.0 Labs (12/14): K 4.9, creatinine 1.8  Allergies (verified):  1) ! Sulfa   Family History:  No FH premature CAD  emphysema: mother  asthma: mother   Social History:  2 children, now lives with son in [place]. Daughter, NP, is  involved  Tobacco Use - Former. quit 1970, started at age 80. 1 ppd  pt is retired. previously worked as a Production designer, theatre/television/film for [employer].   ROS: All systems reviewed and negative except as per HPI.   Past Medical History:  1. Type II diabetes  2. Obesity.  3. COPD. PFTs (12/10): FVC 67%, FEV1 58%, ratio 60%, TLC 95%, RV 138%, DLCO 43% (moderate obstructive defect). Now on home oxygen.  4. Diastolic congestive heart failure. Echo (11/13) with EF 60-65%, mild LVH, moderate biatrial enlargement.  5. Atrial fibrillation. 07/2008, failed cardioversion with TEE guidance in May 2010. Patient went back into NSR by 8/10 with amiodarone.  She was in chronic atrial fibrillation at the time of office visit in 12/13 and is off amiodarone.  6. Chronic kidney disease: ANCA+, pauci-immune glomerulonephritis.   7. History of pneumonia with ARDS in 2005, MRSA PNA with prolonged hospitalization in [facility] in 11/11.  8. Jejunal diverticulum, s/p perf 07/2008  9. Possible obstructive sleep apnea. When the patient was sedated for TEE in May 2010, she did develop some upper airway obstruction. Apparently, however, she has had a negative sleep study.  10. Adenosine myoview (5/10): Normal LV EF and no scar or ischemia (normal stress).  11. Anemia of chronic disease  12. Hypothyroidism  13. Gout  14. Prolonged hospitalization at [facility] 12/12 - 2/13 with diverticulitis.    Current Outpatient Prescriptions  Medication Sig Dispense Refill  . albuterol (PROVENTIL) (2.5 MG/3ML) 0.083% nebulizer solution Take 3 mLs (2.5 mg total) by nebulization 2 (two) times daily. Dx: 492.8 COPD with Emphysema  270 mL  5  . bisoprolol (ZEBETA) 10 MG tablet Take 5 mg by mouth daily.      . diphenhydrAMINE (BENADRYL) 25 MG tablet Take 25 mg by mouth at bedtime as needed for itching.      Marland Kitchen. guaifenesin (HUMIBID E) 400 MG TABS Take 400 mg by mouth 2 (two) times daily.       Marland Kitchen. levothyroxine (SYNTHROID, LEVOTHROID) 50 MCG tablet  Take 50 mcg by mouth daily.      . Multiple Vitamin (MULITIVITAMIN WITH MINERALS) TABS Take 1 tablet by mouth daily.      . potassium chloride SA (K-DUR,KLOR-CON) 20 MEQ tablet Take 20 mEq by mouth daily.      . predniSONE (DELTASONE) 10 MG tablet Take 10 mg by mouth daily with breakfast.      . ranitidine (ZANTAC) 150 MG capsule Take 150 mg by mouth every evening.        . tiotropium (SPIRIVA HANDIHALER) 18 MCG inhalation capsule Place 1 capsule (18 mcg total) into inhaler and inhale daily.  30 capsule  11  . torsemide (DEMADEX) 20 MG tablet Take 80 mg by mouth daily.      . traMADol (ULTRAM) 50 MG tablet Take 0.5 tablets (25 mg total) by mouth every 12 (twelve) hours as needed for moderate pain.  10 tablet  0  . warfarin (COUMADIN) 2 MG tablet Take 1 tablet (2 mg total) by mouth daily.  30 tablet  0  . [DISCONTINUED] albuterol-ipratropium (COMBIVENT) 18-103 MCG/ACT inhaler Inhale 2 puffs into the lungs 2 (two) times daily.      . [DISCONTINUED] colchicine 0.6 MG tablet Take 0.6 mg by mouth daily.      . [DISCONTINUED] famotidine (PEPCID) 20 MG tablet Take 1 tablet (20 mg total) by mouth at bedtime.  30 tablet  1  . [DISCONTINUED] fluticasone (FLOVENT HFA) 110 MCG/ACT inhaler Inhale 2 puffs into the lungs 2 (two) times daily.       No current facility-administered medications for this visit.    BP 124/64  Pulse 94  Ht 5\' 3"  (1.6 m)  Wt 85.276 kg (188 lb)  BMI 33.31 kg/m2 General: NAD, obese.  Neck: Thick, JVP not elevated, no thyromegaly or thyroid nodule.  Lungs: Clear bilaterally  CV: Nondisplaced PMI.  Heart irregular S1/S2, no S3/S4, no murmur.  1+ ankle edema.  No carotid bruit.   Abdomen: Soft, nontender, no hepatosplenomegaly, no distention.   Neurologic: Alert and oriented x 3.  Psych: Normal affect. Extremities: No clubbing or cyanosis.   Assessment/Plan: 1. Diastolic CHF:  Still NYHA class III but improved overall.  Weight is down, JVP not elevated.  Creatinine has been  stable.  - Repeat BMET/BNP today.  - Continue current torsemide dose. - I encouraged her to walk or use a stationary bike for goal 10-15 min several times a week.  2. Atrial fibrillation: Now chronic.  Rate is controlled with bisoprolol. She is on warfarin.   3. CKD: Pauci-immune glomerulonephritis.  She sees Dr. Eliott Nineunham.  Creatinine has been staying stable. 4. COPD: On home oxygen.  Continue Spiriva. 5. Hyperlipidemia: She was taken off her statin due to rise in LFTs during last hospitalization with fever of uncertain etiology.  I will repeat LFTs today.  If they are not elevated, would restart simvastatin.  Marca Ancona 05/20/2013

## 2013-05-20 NOTE — Patient Instructions (Signed)
Your physician recommends that you return for lab work today--BMET/CBCd/Liver profile.  Your physician recommends that you schedule a follow-up appointment in: 3 months with Dr Shirlee LatchMcLean.

## 2013-05-24 ENCOUNTER — Other Ambulatory Visit: Payer: Self-pay | Admitting: Cardiology

## 2013-05-27 ENCOUNTER — Other Ambulatory Visit: Payer: Self-pay | Admitting: Internal Medicine

## 2013-05-27 ENCOUNTER — Other Ambulatory Visit: Payer: Self-pay | Admitting: *Deleted

## 2013-05-27 ENCOUNTER — Other Ambulatory Visit: Payer: Self-pay | Admitting: Cardiology

## 2013-05-29 MED ORDER — WARFARIN SODIUM 2 MG PO TABS: ORAL_TABLET | ORAL | Status: DC

## 2013-05-29 NOTE — Telephone Encounter (Signed)
Empty request 

## 2013-06-01 ENCOUNTER — Other Ambulatory Visit: Payer: Self-pay | Admitting: Cardiology

## 2013-06-06 ENCOUNTER — Emergency Department (HOSPITAL_COMMUNITY): Payer: Medicare Other

## 2013-06-06 ENCOUNTER — Encounter (HOSPITAL_COMMUNITY): Payer: Self-pay | Admitting: Emergency Medicine

## 2013-06-06 ENCOUNTER — Inpatient Hospital Stay (HOSPITAL_COMMUNITY)
Admission: EM | Admit: 2013-06-06 | Discharge: 2013-06-20 | DRG: 391 | Disposition: A | Discharge status: Other Acute Inpatient Hospital | Payer: Medicare Other | Attending: Internal Medicine | Admitting: Internal Medicine

## 2013-06-06 DIAGNOSIS — Z7901 Long term (current) use of anticoagulants: Secondary | ICD-10-CM

## 2013-06-06 DIAGNOSIS — I509 Heart failure, unspecified: Secondary | ICD-10-CM | POA: Diagnosis present

## 2013-06-06 DIAGNOSIS — Z87891 Personal history of nicotine dependence: Secondary | ICD-10-CM

## 2013-06-06 DIAGNOSIS — I739 Peripheral vascular disease, unspecified: Secondary | ICD-10-CM | POA: Diagnosis present

## 2013-06-06 DIAGNOSIS — E440 Moderate protein-calorie malnutrition: Secondary | ICD-10-CM | POA: Diagnosis present

## 2013-06-06 DIAGNOSIS — R4182 Altered mental status, unspecified: Secondary | ICD-10-CM

## 2013-06-06 DIAGNOSIS — M1A30X0 Chronic gout due to renal impairment, unspecified site, without tophus (tophi): Secondary | ICD-10-CM

## 2013-06-06 DIAGNOSIS — R531 Weakness: Secondary | ICD-10-CM

## 2013-06-06 DIAGNOSIS — E039 Hypothyroidism, unspecified: Secondary | ICD-10-CM | POA: Diagnosis present

## 2013-06-06 DIAGNOSIS — G4733 Obstructive sleep apnea (adult) (pediatric): Secondary | ICD-10-CM | POA: Diagnosis present

## 2013-06-06 DIAGNOSIS — J09X2 Influenza due to identified novel influenza A virus with other respiratory manifestations: Secondary | ICD-10-CM

## 2013-06-06 DIAGNOSIS — J438 Other emphysema: Secondary | ICD-10-CM | POA: Diagnosis present

## 2013-06-06 DIAGNOSIS — G92 Toxic encephalopathy: Secondary | ICD-10-CM | POA: Diagnosis present

## 2013-06-06 DIAGNOSIS — R001 Bradycardia, unspecified: Secondary | ICD-10-CM

## 2013-06-06 DIAGNOSIS — M109 Gout, unspecified: Secondary | ICD-10-CM | POA: Diagnosis present

## 2013-06-06 DIAGNOSIS — N189 Chronic kidney disease, unspecified: Secondary | ICD-10-CM

## 2013-06-06 DIAGNOSIS — I4891 Unspecified atrial fibrillation: Secondary | ICD-10-CM | POA: Diagnosis present

## 2013-06-06 DIAGNOSIS — K5720 Diverticulitis of large intestine with perforation and abscess without bleeding: Secondary | ICD-10-CM

## 2013-06-06 DIAGNOSIS — G929 Unspecified toxic encephalopathy: Secondary | ICD-10-CM | POA: Diagnosis present

## 2013-06-06 DIAGNOSIS — I129 Hypertensive chronic kidney disease with stage 1 through stage 4 chronic kidney disease, or unspecified chronic kidney disease: Secondary | ICD-10-CM | POA: Diagnosis present

## 2013-06-06 DIAGNOSIS — Z79899 Other long term (current) drug therapy: Secondary | ICD-10-CM

## 2013-06-06 DIAGNOSIS — I5032 Chronic diastolic (congestive) heart failure: Secondary | ICD-10-CM | POA: Diagnosis present

## 2013-06-06 DIAGNOSIS — N184 Chronic kidney disease, stage 4 (severe): Secondary | ICD-10-CM | POA: Diagnosis present

## 2013-06-06 DIAGNOSIS — K632 Fistula of intestine: Secondary | ICD-10-CM | POA: Diagnosis not present

## 2013-06-06 DIAGNOSIS — K630 Abscess of intestine: Secondary | ICD-10-CM | POA: Diagnosis present

## 2013-06-06 DIAGNOSIS — R509 Fever, unspecified: Secondary | ICD-10-CM

## 2013-06-06 DIAGNOSIS — Z9981 Dependence on supplemental oxygen: Secondary | ICD-10-CM

## 2013-06-06 DIAGNOSIS — L02219 Cutaneous abscess of trunk, unspecified: Secondary | ICD-10-CM | POA: Diagnosis not present

## 2013-06-06 DIAGNOSIS — Y95 Nosocomial condition: Secondary | ICD-10-CM

## 2013-06-06 DIAGNOSIS — E119 Type 2 diabetes mellitus without complications: Secondary | ICD-10-CM | POA: Diagnosis present

## 2013-06-06 DIAGNOSIS — J9610 Chronic respiratory failure, unspecified whether with hypoxia or hypercapnia: Secondary | ICD-10-CM

## 2013-06-06 DIAGNOSIS — R5381 Other malaise: Secondary | ICD-10-CM

## 2013-06-06 DIAGNOSIS — M1990 Unspecified osteoarthritis, unspecified site: Secondary | ICD-10-CM | POA: Diagnosis present

## 2013-06-06 DIAGNOSIS — E46 Unspecified protein-calorie malnutrition: Secondary | ICD-10-CM | POA: Diagnosis present

## 2013-06-06 DIAGNOSIS — Z01810 Encounter for preprocedural cardiovascular examination: Secondary | ICD-10-CM

## 2013-06-06 DIAGNOSIS — J1100 Influenza due to unidentified influenza virus with unspecified type of pneumonia: Secondary | ICD-10-CM | POA: Diagnosis present

## 2013-06-06 DIAGNOSIS — IMO0002 Reserved for concepts with insufficient information to code with codable children: Secondary | ICD-10-CM

## 2013-06-06 DIAGNOSIS — L03319 Cellulitis of trunk, unspecified: Secondary | ICD-10-CM

## 2013-06-06 DIAGNOSIS — R791 Abnormal coagulation profile: Secondary | ICD-10-CM | POA: Diagnosis not present

## 2013-06-06 DIAGNOSIS — J439 Emphysema, unspecified: Secondary | ICD-10-CM

## 2013-06-06 DIAGNOSIS — Z6831 Body mass index (BMI) 31.0-31.9, adult: Secondary | ICD-10-CM

## 2013-06-06 DIAGNOSIS — K5732 Diverticulitis of large intestine without perforation or abscess without bleeding: Principal | ICD-10-CM | POA: Diagnosis present

## 2013-06-06 DIAGNOSIS — J189 Pneumonia, unspecified organism: Secondary | ICD-10-CM

## 2013-06-06 DIAGNOSIS — I10 Essential (primary) hypertension: Secondary | ICD-10-CM

## 2013-06-06 LAB — URINALYSIS, ROUTINE W REFLEX MICROSCOPIC
BILIRUBIN URINE: NEGATIVE
GLUCOSE, UA: NEGATIVE mg/dL
KETONES UR: NEGATIVE mg/dL
NITRITE: NEGATIVE
Protein, ur: NEGATIVE mg/dL
Specific Gravity, Urine: 1.012 (ref 1.005–1.030)
Urobilinogen, UA: 0.2 mg/dL (ref 0.0–1.0)
pH: 5 (ref 5.0–8.0)

## 2013-06-06 LAB — URINE MICROSCOPIC-ADD ON

## 2013-06-06 LAB — PROTIME-INR
INR: 1.25 (ref 0.00–1.49)
PROTHROMBIN TIME: 15.4 s — AB (ref 11.6–15.2)

## 2013-06-06 LAB — CBC WITH DIFFERENTIAL/PLATELET
Basophils Absolute: 0 10*3/uL (ref 0.0–0.1)
Basophils Relative: 0 % (ref 0–1)
EOS ABS: 0 10*3/uL (ref 0.0–0.7)
Eosinophils Relative: 0 % (ref 0–5)
HCT: 36.6 % (ref 36.0–46.0)
Hemoglobin: 11.4 g/dL — ABNORMAL LOW (ref 12.0–15.0)
LYMPHS ABS: 1.1 10*3/uL (ref 0.7–4.0)
Lymphocytes Relative: 10 % — ABNORMAL LOW (ref 12–46)
MCH: 28.4 pg (ref 26.0–34.0)
MCHC: 31.1 g/dL (ref 30.0–36.0)
MCV: 91.3 fL (ref 78.0–100.0)
Monocytes Absolute: 0.5 10*3/uL (ref 0.1–1.0)
Monocytes Relative: 4 % (ref 3–12)
NEUTROS ABS: 9.6 10*3/uL — AB (ref 1.7–7.7)
NEUTROS PCT: 86 % — AB (ref 43–77)
PLATELETS: 231 10*3/uL (ref 150–400)
RBC: 4.01 MIL/uL (ref 3.87–5.11)
RDW: 15 % (ref 11.5–15.5)
WBC: 11.2 10*3/uL — ABNORMAL HIGH (ref 4.0–10.5)

## 2013-06-06 LAB — I-STAT CHEM 8, ED
BUN: 77 mg/dL — ABNORMAL HIGH (ref 6–23)
CALCIUM ION: 1.1 mmol/L — AB (ref 1.13–1.30)
CHLORIDE: 95 meq/L — AB (ref 96–112)
Creatinine, Ser: 2.1 mg/dL — ABNORMAL HIGH (ref 0.50–1.10)
Glucose, Bld: 198 mg/dL — ABNORMAL HIGH (ref 70–99)
HEMATOCRIT: 38 % (ref 36.0–46.0)
HEMOGLOBIN: 12.9 g/dL (ref 12.0–15.0)
POTASSIUM: 4.6 meq/L (ref 3.7–5.3)
Sodium: 135 mEq/L — ABNORMAL LOW (ref 137–147)
TCO2: 29 mmol/L (ref 0–100)

## 2013-06-06 LAB — LIPASE, BLOOD: LIPASE: 20 U/L (ref 11–59)

## 2013-06-06 LAB — PRO B NATRIURETIC PEPTIDE: Pro B Natriuretic peptide (BNP): 1764 pg/mL — ABNORMAL HIGH (ref 0–450)

## 2013-06-06 LAB — CBG MONITORING, ED: Glucose-Capillary: 152 mg/dL — ABNORMAL HIGH (ref 70–99)

## 2013-06-06 MED ORDER — IOHEXOL 300 MG/ML  SOLN
25.0000 mL | Freq: Once | INTRAMUSCULAR | Status: AC | PRN
  Administered 2013-06-06: 25 mL via ORAL

## 2013-06-06 MED ORDER — SODIUM CHLORIDE 0.9 % IV SOLN
1000.0000 mL | Freq: Once | INTRAVENOUS | Status: AC
  Administered 2013-06-06: 1000 mL via INTRAVENOUS

## 2013-06-06 MED ORDER — AZITHROMYCIN 500 MG PO TABS
500.0000 mg | ORAL_TABLET | ORAL | Status: DC
  Filled 2013-06-06: qty 1

## 2013-06-06 MED ORDER — IOHEXOL 300 MG/ML  SOLN: 25.0000 mL | Freq: Once | INTRAMUSCULAR | Status: AC | PRN

## 2013-06-06 MED ORDER — SODIUM CHLORIDE 0.9 % IV BOLUS (SEPSIS)
1000.0000 mL | Freq: Once | INTRAVENOUS | Status: AC
  Administered 2013-06-06: 1000 mL via INTRAVENOUS

## 2013-06-06 MED ORDER — SODIUM CHLORIDE 0.9 % IV SOLN: 1000.0000 mL | INTRAVENOUS | Status: DC

## 2013-06-06 MED ORDER — DEXTROSE 5 % IV SOLN
1.0000 g | Freq: Once | INTRAVENOUS | Status: AC
  Administered 2013-06-06: 1 g via INTRAVENOUS
  Filled 2013-06-06: qty 10

## 2013-06-06 MED ORDER — MORPHINE SULFATE 2 MG/ML IJ SOLN
2.0000 mg | Freq: Once | INTRAMUSCULAR | Status: AC
  Administered 2013-06-06: 2 mg via INTRAVENOUS
  Filled 2013-06-06: qty 1

## 2013-06-06 MED ORDER — AZITHROMYCIN 500 MG IV SOLR
500.0000 mg | Freq: Once | INTRAVENOUS | Status: AC
  Administered 2013-06-07: 500 mg via INTRAVENOUS

## 2013-06-06 MED ORDER — DEXTROSE 5 % IV SOLN: 1.0000 g | INTRAVENOUS | Status: DC

## 2013-06-06 MED ORDER — ONDANSETRON HCL 4 MG/2ML IJ SOLN
4.0000 mg | Freq: Once | INTRAMUSCULAR | Status: AC
  Administered 2013-06-06: 4 mg via INTRAVENOUS
  Filled 2013-06-06: qty 2

## 2013-06-06 MED ORDER — SODIUM CHLORIDE 0.9 % IV SOLN: 1000.0000 mL | Freq: Once | INTRAVENOUS | Status: DC

## 2013-06-06 MED ORDER — ACETAMINOPHEN 325 MG PO TABS
650.0000 mg | ORAL_TABLET | Freq: Once | ORAL | Status: AC
  Administered 2013-06-06: 650 mg via ORAL
  Filled 2013-06-06: qty 2

## 2013-06-06 NOTE — ED Notes (Signed)
Pt. arrived with EMS from home family reported pt. is confused and disoriented onset this morning / progressing generalized weakness , pt. is alert but confused to time . Respirations unlabored / denies pain at arrival .

## 2013-06-06 NOTE — ED Provider Notes (Signed)
CSN: [id number]     Arrival date & time 06/06/13  2003 History   First MD Initiated Contact with Patient 06/06/13 2010     Chief Complaint  Patient presents with  . Altered Mental Status   level V caveat applies secondary to altered mental status  HPI  History provided by patient and family. Patient is an 80 year old female with history of hypertension, hyperlipidemia, diabetes, CAD, CHF, A. fib on Coumadin and COPD with multiple previous pneumonia infections who presents with increased fatigue and alternate status. Family states patient has had some coughing over the past 2-3 days with increased fatigue. She has not felt like moving around or getting up much per her usual. Today they really noticed a change in her cognitive function. They state she is normally very sharp but seemed very slow in thinking and confused at times. They did not notice any weakness in her extremities or facial droop. There was no slurred speech. They aren't sure of any fevers at home. There has not been any reports of vomiting or diarrhea. No recent travel or specific sick contacts.   Past Medical History  Diagnosis Date  . Diabetes mellitus   . Obesity   . COPD (chronic obstructive pulmonary disease)     Moderate. PFTs (12/10): FVC 67%, FEV1 58%, ratio 60%, TLC 95%, RV 138%, DLCO 43% (moderate obstructive defect). She is on home oxygen that should be worn at all times.  . Diastolic CHF, chronic     Echo (3/12): EF 55-60%, mild LV hypertrophy, grade I diastolic dysfunction, mild left atrial enlargement, normal pulmonary artery pressure  . CKD (chronic kidney disease)     Cr 2.4 when last checked. ANCA +, pauci-immune glomerulnephritis followed by Dr. Arrie Aranoladonato  . PNA (pneumonia)     h/o with ARDS in 2005; MRSA PNA with [facility] 11/11  . Diverticulum     Jejunal  . OSA (obstructive sleep apnea)     possible. When pt was sedated for TEE in May 2010, she did develop some upper  airway obstruction  . Anemia     of chronic disease  . Hypothyroidism   . Gout   . Atrial fibrillation 07/2008    Failed cardioversion with TEE guidance in May 2010. Pt went back to NSR by 11/2008 with amiodarne  . Hypertension   . GERD (gastroesophageal reflux disease)   . Peripheral vascular disease   . Pneumonia   . Chronic kidney disease   . Respiratory failure, chronic 07/01/2011  . Chronic gout due to renal impairment 07/01/2011   Past Surgical History  Procedure Laterality Date  . Tracheostomy      ARDS/ ICU admission, trach 2005  . Microperforation  08/2008    Of jejunem, ICU admission   Family History  Problem Relation Age of Onset  . Emphysema Mother     and asthma  . Asthma Mother   . Heart disease Neg Hx     premature   History  Substance Use Topics  . Smoking status: Former Smoker -- 1.00 packs/day for 23 years    Types: Cigarettes    Quit date: 04/11/1976  . Smokeless tobacco: Never Used     Comment: Started at age 80 (quit at age 80)  . Alcohol Use: Yes     Comment: a glass of wine occasionally (once a week)   OB History   Grav Para Term Preterm Abortions TAB SAB Ect Mult Living  Review of Systems  Constitutional: Positive for fatigue.  Respiratory: Positive for cough.   Gastrointestinal: Positive for abdominal pain. Negative for nausea, vomiting, diarrhea and constipation.  Psychiatric/Behavioral: Positive for confusion.  All other systems reviewed and are negative.      Allergies  Sulfonamide derivatives  Home Medications   Current Outpatient Rx  Name  Route  Sig  Dispense  Refill  . albuterol (PROVENTIL) (2.5 MG/3ML) 0.083% nebulizer solution   Nebulization   Take 3 mLs (2.5 mg total) by nebulization 2 (two) times daily. Dx: 492.8 COPD with Emphysema   270 mL   5   . bisoprolol (ZEBETA) 10 MG tablet   Oral   Take 5 mg by mouth daily.         . diphenhydrAMINE (BENADRYL) 25 MG tablet   Oral   Take 25 mg by  mouth at bedtime as needed for itching.         Marland Kitchen guaifenesin (HUMIBID E) 400 MG TABS   Oral   Take 400 mg by mouth 2 (two) times daily.          Marland Kitchen KLOR-CON M20 20 MEQ tablet      TAKE ONE TABLET BY MOUTH ONCE DAILY   30 tablet   0   . levothyroxine (SYNTHROID, LEVOTHROID) 50 MCG tablet   Oral   Take 50 mcg by mouth daily.         . Multiple Vitamin (MULITIVITAMIN WITH MINERALS) TABS   Oral   Take 1 tablet by mouth daily.         . potassium chloride SA (K-DUR,KLOR-CON) 20 MEQ tablet   Oral   Take 20 mEq by mouth daily.         . predniSONE (DELTASONE) 10 MG tablet   Oral   Take 10 mg by mouth daily with breakfast.         . ranitidine (ZANTAC) 150 MG capsule   Oral   Take 150 mg by mouth every evening.           . tiotropium (SPIRIVA HANDIHALER) 18 MCG inhalation capsule   Inhalation   Place 1 capsule (18 mcg total) into inhaler and inhale daily.   30 capsule   11   . torsemide (DEMADEX) 20 MG tablet   Oral   Take 80 mg by mouth daily.         Marland Kitchen torsemide (DEMADEX) 20 MG tablet      TAKE FOUR TABLETS BY MOUTH IN THE MORNING AT THE SAME TIME   240 tablet   0   . traMADol (ULTRAM) 50 MG tablet   Oral   Take 0.5 tablets (25 mg total) by mouth every 12 (twelve) hours as needed for moderate pain.   10 tablet   0   . warfarin (COUMADIN) 2 MG tablet      Take as directed by coumadin clinic   35 tablet   3    BP 156/90  Pulse 125  Temp(Src) 103.2 F (39.6 C) (Rectal)  Resp 27  SpO2 99% Physical Exam  Nursing note and vitals reviewed. Constitutional: She is oriented to person, place, and time. She appears well-developed and well-nourished. No distress.  HENT:  Head: Normocephalic and atraumatic.  Oral mucosa dry  Eyes: Conjunctivae and EOM are normal. Pupils are equal, round, and reactive to light.  Neck: Normal range of motion. Neck supple.  Cardiovascular: An irregular rhythm present. Tachycardia present.   Pulmonary/Chest: Effort  normal. No respiratory  distress. She has wheezes. She has rales. She exhibits no tenderness.  Abdominal: Soft. She exhibits no distension. There is tenderness. There is no rebound and no guarding.  Diffuse abdominal tenderness greatest on the left side  Musculoskeletal:  Healing wound to the lateral left lower leg. There is no significant surrounding erythema or induration. No bleeding or drainage.  Neurological: She is alert and oriented to person, place, and time. No cranial nerve deficit or sensory deficit.  Strength equal in all extremities.  Skin: Skin is warm and dry.  Dry flaking skin on extremities  Psychiatric: She has a normal mood and affect. Her behavior is normal.    ED Course  Procedures   DIAGNOSTIC STUDIES: Oxygen Saturation is 99% on room air.    COORDINATION OF CARE:  Nursing notes reviewed. Vital signs reviewed. Initial pt interview and examination performed.   8:44 PM-patient seen and evaluated. Patient with mild confusion. Awake and alert oriented x3 able to follow commands. Patient is febrile with tachycardia. Pulse irregular with history of A. fib. Discussed work up plan with pt and family at bedside, which includes laboratory testing, EKG, chest x-rays abdominal CT head CT . Pt and family agrees with plan.  Patient is feeling improved. Her fever has improved after treatment. Are also improved with fluids. Chest x-ray with possible pneumonia. We'll begin treatments. Phlebotomy is having a hard time drawing blood.  Labs were able to be drawn from the IV. Patient continues to be doing well. We're waiting on BUN and creatinine prior to possible CT abdomen and pelvis with contrast.   CT scan delayed given the leg and obtaining lab tests. Labs do show elevated BUN and creatinine. Will he perform a noncontrast scan of abdomen and pelvis.  Spoke with the radiologist. CT scan of abdomen shows concerns for diverticular abscess with fistulization.  2:00 AM spoke with Dr.  Janee Morn on call for general surgery. He will see patient in consult and agrees with hospital admission.  2:20AM spoke with Dr. Allena Katz with [facility]. He'll see patient and admit. Would like a telemetry bed.  Treatment plan initiated: Medications  0.9 %  sodium chloride infusion (not administered)  sodium chloride 0.9 % bolus 1,000 mL (1,000 mLs Intravenous New Bag/Given 06/06/13 2039)  acetaminophen (TYLENOL) tablet 650 mg (650 mg Oral Given 06/06/13 2041)   Results for orders placed during the hospital encounter of 06/06/13  CBC WITH DIFFERENTIAL      Result Value Ref Range   WBC 11.2 (*) 4.0 - 10.5 K/uL   RBC 4.01  3.87 - 5.11 MIL/uL   Hemoglobin 11.4 (*) 12.0 - 15.0 g/dL   HCT 16.1  09.6 - 04.5 %   MCV 91.3  78.0 - 100.0 fL   MCH 28.4  26.0 - 34.0 pg   MCHC 31.1  30.0 - 36.0 g/dL   RDW 40.9  81.1 - 91.4 %   Platelets 231  150 - 400 K/uL   Neutrophils Relative % 86 (*) 43 - 77 %   Neutro Abs 9.6 (*) 1.7 - 7.7 K/uL   Lymphocytes Relative 10 (*) 12 - 46 %   Lymphs Abs 1.1  0.7 - 4.0 K/uL   Monocytes Relative 4  3 - 12 %   Monocytes Absolute 0.5  0.1 - 1.0 K/uL   Eosinophils Relative 0  0 - 5 %   Eosinophils Absolute 0.0  0.0 - 0.7 K/uL   Basophils Relative 0  0 - 1 %   Basophils  Absolute 0.0  0.0 - 0.1 K/uL  COMPREHENSIVE METABOLIC PANEL      Result Value Ref Range   Sodium 132 (*) 137 - 147 mEq/L   Potassium 4.6  3.7 - 5.3 mEq/L   Chloride 89 (*) 96 - 112 mEq/L   CO2 25  19 - 32 mEq/L   Glucose, Bld 194 (*) 70 - 99 mg/dL   BUN 62 (*) 6 - 23 mg/dL   Creatinine, Ser 1.61 (*) 0.50 - 1.10 mg/dL   Calcium 8.6  8.4 - 09.6 mg/dL   Total Protein 6.7  6.0 - 8.3 g/dL   Albumin 2.7 (*) 3.5 - 5.2 g/dL   AST 48 (*) 0 - 37 U/L   ALT 49 (*) 0 - 35 U/L   Alkaline Phosphatase 230 (*) 39 - 117 U/L   Total Bilirubin 0.3  0.3 - 1.2 mg/dL   GFR calc non Af Amer 22 (*) >90 mL/min   GFR calc Af Amer 26 (*) >90 mL/min  URINALYSIS, ROUTINE W REFLEX MICROSCOPIC      Result Value Ref  Range   Color, Urine YELLOW  YELLOW   APPearance CLEAR  CLEAR   Specific Gravity, Urine 1.012  1.005 - 1.030   pH 5.0  5.0 - 8.0   Glucose, UA NEGATIVE  NEGATIVE mg/dL   Hgb urine dipstick MODERATE (*) NEGATIVE   Bilirubin Urine NEGATIVE  NEGATIVE   Ketones, ur NEGATIVE  NEGATIVE mg/dL   Protein, ur NEGATIVE  NEGATIVE mg/dL   Urobilinogen, UA 0.2  0.0 - 1.0 mg/dL   Nitrite NEGATIVE  NEGATIVE   Leukocytes, UA TRACE (*) NEGATIVE  PROTIME-INR      Result Value Ref Range   Prothrombin Time 15.4 (*) 11.6 - 15.2 seconds   INR 1.25  0.00 - 1.49  PROCALCITONIN      Result Value Ref Range   Procalcitonin 0.40    URINE MICROSCOPIC-ADD ON      Result Value Ref Range   Squamous Epithelial / LPF RARE  RARE   WBC, UA 0-2  <3 WBC/hpf   RBC / HPF 7-10  <3 RBC/hpf   Bacteria, UA RARE  RARE   Casts HYALINE CASTS (*) NEGATIVE   Urine-Other RARE YEAST    PRO B NATRIURETIC PEPTIDE      Result Value Ref Range   Pro B Natriuretic peptide (BNP) 1764.0 (*) 0 - 450 pg/mL  LIPASE, BLOOD      Result Value Ref Range   Lipase 20  11 - 59 U/L  CBG MONITORING, ED      Result Value Ref Range   Glucose-Capillary 152 (*) 70 - 99 mg/dL  I-STAT CG4 LACTIC ACID, ED      Result Value Ref Range   Lactic Acid, Venous 0.68  0.5 - 2.2 mmol/L  I-STAT CHEM 8, ED      Result Value Ref Range   Sodium 135 (*) 137 - 147 mEq/L   Potassium 4.6  3.7 - 5.3 mEq/L   Chloride 95 (*) 96 - 112 mEq/L   BUN 77 (*) 6 - 23 mg/dL   Creatinine, Ser 0.45 (*) 0.50 - 1.10 mg/dL   Glucose, Bld 409 (*) 70 - 99 mg/dL   Calcium, Ion 8.11 (*) 1.13 - 1.30 mmol/L   TCO2 29  0 - 100 mmol/L   Hemoglobin 12.9  12.0 - 15.0 g/dL   HCT 91.4  78.2 - 95.6 %     Imaging Review Ct Abdomen Pelvis Wo  Contrast  06/07/2013   CLINICAL DATA:  Altered mental status, fever and weakness. Abdominal pain, nausea and vomiting.  EXAM: CT ABDOMEN AND PELVIS WITHOUT CONTRAST  TECHNIQUE: Multidetector CT imaging of the abdomen and pelvis was performed  following the standard protocol without intravenous contrast.  COMPARISON:  CT of the abdomen and pelvis performed 03/20/2013, and abdominal ultrasound performed 03/21/2013  FINDINGS: Mild left basilar atelectasis is noted.  The mildly nodular contour of the liver may reflect mild cirrhotic change. A few small stones are seen layering dependently within the gallbladder; the gallbladder is otherwise unremarkable in appearance. The pancreas and adrenal glands are unremarkable.  Mild bilateral renal atrophy is noted. A 1.6 cm cyst is noted arising along the posterior aspect of the right kidney. There is no evidence of hydronephrosis. No renal or ureteral stones are seen. No significant perinephric stranding is appreciated.  No free fluid is identified. The small bowel is unremarkable in appearance. The stomach is within normal limits. No acute vascular abnormalities are seen. There is diffuse tortuosity of the abdominal aorta, with slight ectasia of the distal abdominal aorta. Diffuse calcification is seen along the abdominal aorta and its branches.  The appendix is normal in caliber, without evidence for appendicitis. Contrast progresses to the level of the mid transverse colon. Diffuse diverticulosis is noted along the sigmoid colon.  There is a enlarged 4.6 x 3.6 x 2.9 cm collection of fluid and air tracking adjacent to the uterus and left side of the bladder, containing minimal high density debris. There is also an enlarging collection of fluid and air noted along the upper left pelvic sidewall, measuring 6.3 x 2.5 x 4.4 cm. These are directly adjacent to the left ovary.  On comparison with multiple prior CTs, the patient did not have a large colonic diverticulum in this region. Stool within the pelvic sidewall collection on the prior study likely reflected a fistulous connection to a complex abscess in the pelvis. An inflamed diverticulum would not demonstrate this degree of enlargement over time. Surrounding soft  tissue inflammation is seen. Communication between these abscesses is not well assessed.  The bladder is moderately distended and grossly unremarkable in appearance, though displaced to the right by the patient's abscess. The uterus is difficult to fully assess, as it extends directly adjacent to the abscess, but appears grossly unremarkable, aside from a single tiny focus of air at the cervix, likely within normal limits. No inguinal lymphadenopathy is seen.  No acute osseous abnormalities are identified. 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  IMPRESSION: 1. Enlarging mildly complex abscesses noted within the pelvis. 4.6 x 3.6 x 2.9 cm collection of fluid and air tracking adjacent to the uterus and left side of the bladder has increased in size from the prior study, and now contains minimal high density debris. An additional enlarging collection of fluid and air along the upper left pelvic sidewall measures 6.3 x 2.5 x 4.4 cm. Stool was noted in the pelvic sidewall collection on the prior study, likely reflecting a fistulous connection to the abscess. No large colonic diverticula were present in this region on prior CTs to suggest a thick-walled diverticulum, and a colonic diverticulum would not demonstrate this degree of enlargement and irregularity over time. Surrounding soft tissue inflammation noted. 2. Diffuse diverticulosis along the sigmoid colon. 3. Cholelithiasis; gallbladder otherwise unremarkable in appearance. 4. Mild bilateral renal atrophy seen.  Small right renal cyst noted. 5. Diffuse calcification along the abdominal aorta and  its branches. 6. Mildly nodular contour of the liver may reflect mild cirrhotic change. 7. Mild left basilar atelectasis noted.  These results were called by telephone at the time of interpretation on 06/07/2013 at 1:51 AM to [facility] PA, who verbally acknowledged these results.   Electronically Signed   By: Roanna Raider M.D.   On: 06/07/2013  01:54   Dg Chest 2 View  06/06/2013   CLINICAL DATA:  Weakness cough  EXAM: CHEST  2 VIEW  COMPARISON:  03/21/2013 prior chest radiographs  FINDINGS: Cardiomegaly noted.  Left lower lobe opacity is suspicious for pneumonia.  There is no evidence of pulmonary edema, suspicious pulmonary nodule/mass, pleural effusion, or pneumothorax. No acute bony abnormalities are identified.  IMPRESSION: Probable left lower lobe pneumonia -radiographic followup to resolution recommended.  Cardiomegaly.   Electronically Signed   By: Laveda Abbe M.D.   On: 06/06/2013 21:22   Ct Head Wo Contrast  06/07/2013   CLINICAL DATA:  Altered mental status, weakness and fever. Abdominal pain, nausea and vomiting.  EXAM: CT HEAD WITHOUT CONTRAST  TECHNIQUE: Contiguous axial images were obtained from the base of the skull through the vertex without intravenous contrast.  COMPARISON:  None.  FINDINGS: There is no evidence of acute infarction, mass lesion, or intra- or extra-axial hemorrhage on CT.  Prominence of the ventricles and sulci reflects mild cortical volume loss. Scattered periventricular and subcortical white matter change likely reflects small vessel ischemic microangiopathy. Mild cerebellar atrophy is noted.  The brainstem and fourth ventricle are within normal limits. The basal ganglia are unremarkable in appearance. The cerebral hemispheres demonstrate grossly normal gray-white differentiation. No mass effect or midline shift is seen.  There is no evidence of fracture; visualized osseous structures are unremarkable in appearance. The orbits are within normal limits. A mucus retention cyst or polyp is noted at the left maxillary sinus. There is opacification of the right side of the sphenoid sinus. The remaining paranasal sinuses and mastoid air cells are well-aerated. No significant soft tissue abnormalities are seen.  IMPRESSION: 1. No acute intracranial pathology seen on CT. 2. Mild cortical volume loss and scattered small  vessel ischemic microangiopathy. 3. Mucus retention cyst or polyp at the left maxillary sinus; opacification of the right side of the sphenoid sinus.   Electronically Signed   By: Roanna Raider M.D.   On: 06/07/2013 01:26    EKG Interpretation   None       MDM   Final diagnoses:  Colonic diverticular abscess  HAP (hospital-acquired pneumonia)  Fever  Altered mental status        Angus Seller, PA-C 06/07/13 [phone]

## 2013-06-06 NOTE — ED Notes (Signed)
This RN spoke with phlebotomy who stated that they attempted x 3 to draw blood and was unsuccessful. They report notifying the MD. This RN spoke with Dammen, PA, who ordered that the pt's IV be stopped and attempt to draw back blood. He also would like the antibiotics to be held until blood cultures can be drawn.

## 2013-06-06 NOTE — Progress Notes (Signed)
ANTIBIOTIC CONSULT NOTE - INITIAL  Pharmacy Consult for rocephin/azith Indication: pneumonia  Allergies  Allergen Reactions  . Sulfonamide Derivatives Other (See Comments)    Childhood reaction    Patient Measurements:   Adjusted Body Weight:   Vital Signs: Temp: 103.2 F (39.6 C) (02/26 2030) Temp src: Rectal (02/26 2009) BP: 156/90 mmHg (02/26 2009) Pulse Rate: 125 (02/26 2009) Intake/Output from previous day:   Intake/Output from this shift:    Labs: No results found for this basename: WBC, HGB, PLT, LABCREA, CREATININE,  in the last 72 hours The CrCl is unknown because both a height and weight (above a minimum accepted value) are required for this calculation. No results found for this basename: VANCOTROUGH, VANCOPEAK, VANCORANDOM, GENTTROUGH, GENTPEAK, GENTRANDOM, TOBRATROUGH, TOBRAPEAK, TOBRARND, AMIKACINPEAK, AMIKACINTROU, AMIKACIN,  in the last 72 hours   Microbiology: No results found for this or any previous visit (from the past 720 hour(s)).  Medical History: Past Medical History  Diagnosis Date  . Diabetes mellitus   . Obesity   . COPD (chronic obstructive pulmonary disease)     Moderate. PFTs (12/10): FVC 67%, FEV1 58%, ratio 60%, TLC 95%, RV 138%, DLCO 43% (moderate obstructive defect). She is on home oxygen that should be worn at all times.  . Diastolic CHF, chronic     Echo (3/12): EF 55-60%, mild LV hypertrophy, grade I diastolic dysfunction, mild left atrial enlargement, normal pulmonary artery pressure  . CKD (chronic kidney disease)     Cr 2.4 when last checked. ANCA +, pauci-immune glomerulnephritis followed by Dr. Arrie Aranoladonato  . PNA (pneumonia)     h/o with ARDS in 2005; MRSA PNA [facility]11  . Diverticulum     Jejunal  . OSA (obstructive sleep apnea)     possible. When pt was sedated for TEE in May 2010, she did develop some upper airway obstruction  . Anemia     of chronic disease  . Hypothyroidism    . Gout   . Atrial fibrillation 07/2008    Failed cardioversion with TEE guidance in May 2010. Pt went back to NSR by 11/2008 with amiodarne  . Hypertension   . GERD (gastroesophageal reflux disease)   . Peripheral vascular disease   . Pneumonia   . Chronic kidney disease   . Respiratory failure, chronic 07/01/2011  . Chronic gout due to renal impairment 07/01/2011    Medications:   (Not in a hospital admission) Scheduled:  . [START ON 06/07/2013] azithromycin  500 mg Oral Q24H   Assessment: Pt came in with CAP. Plan to start rocephin/azith here. First dose in the ED. Since these do not require dosage adjustments, Rx will start then sign off.    Plan:   Rocephin 1g IV q24 x7 Azithromycin 500mg  IV q24 x7 Rx sign off

## 2013-06-07 ENCOUNTER — Emergency Department (HOSPITAL_COMMUNITY): Payer: Medicare Other

## 2013-06-07 ENCOUNTER — Inpatient Hospital Stay (HOSPITAL_COMMUNITY): Payer: Medicare Other

## 2013-06-07 ENCOUNTER — Encounter (HOSPITAL_COMMUNITY): Payer: Self-pay | Admitting: Radiology

## 2013-06-07 DIAGNOSIS — J9610 Chronic respiratory failure, unspecified whether with hypoxia or hypercapnia: Secondary | ICD-10-CM

## 2013-06-07 DIAGNOSIS — R4182 Altered mental status, unspecified: Secondary | ICD-10-CM

## 2013-06-07 DIAGNOSIS — K5732 Diverticulitis of large intestine without perforation or abscess without bleeding: Principal | ICD-10-CM

## 2013-06-07 DIAGNOSIS — N184 Chronic kidney disease, stage 4 (severe): Secondary | ICD-10-CM

## 2013-06-07 DIAGNOSIS — I4891 Unspecified atrial fibrillation: Secondary | ICD-10-CM

## 2013-06-07 DIAGNOSIS — J09X2 Influenza due to identified novel influenza A virus with other respiratory manifestations: Secondary | ICD-10-CM

## 2013-06-07 DIAGNOSIS — J189 Pneumonia, unspecified organism: Secondary | ICD-10-CM

## 2013-06-07 DIAGNOSIS — I5032 Chronic diastolic (congestive) heart failure: Secondary | ICD-10-CM

## 2013-06-07 DIAGNOSIS — J438 Other emphysema: Secondary | ICD-10-CM

## 2013-06-07 DIAGNOSIS — K630 Abscess of intestine: Secondary | ICD-10-CM

## 2013-06-07 LAB — COMPREHENSIVE METABOLIC PANEL
ALK PHOS: 230 U/L — AB (ref 39–117)
ALK PHOS: 201 U/L — AB (ref 39–117)
ALT: 49 U/L — AB (ref 0–35)
ALT: 38 U/L — ABNORMAL HIGH (ref 0–35)
AST: 48 U/L — ABNORMAL HIGH (ref 0–37)
AST: 32 U/L (ref 0–37)
Albumin: 2.7 g/dL — ABNORMAL LOW (ref 3.5–5.2)
Albumin: 2.4 g/dL — ABNORMAL LOW (ref 3.5–5.2)
BUN: 62 mg/dL — ABNORMAL HIGH (ref 6–23)
BUN: 57 mg/dL — AB (ref 6–23)
CHLORIDE: 89 meq/L — AB (ref 96–112)
CHLORIDE: 97 meq/L (ref 96–112)
CO2: 25 mEq/L (ref 19–32)
CO2: 25 mEq/L (ref 19–32)
Calcium: 8.6 mg/dL (ref 8.4–10.5)
Calcium: 8.3 mg/dL — ABNORMAL LOW (ref 8.4–10.5)
Creatinine, Ser: 2.02 mg/dL — ABNORMAL HIGH (ref 0.50–1.10)
Creatinine, Ser: 1.78 mg/dL — ABNORMAL HIGH (ref 0.50–1.10)
GFR calc Af Amer: 30 mL/min — ABNORMAL LOW (ref >90)
GFR calc non Af Amer: 22 mL/min — ABNORMAL LOW (ref >90)
GFR calc non Af Amer: 26 mL/min — ABNORMAL LOW (ref >90)
GFR, EST AFRICAN AMERICAN: 26 mL/min — AB (ref >90)
GLUCOSE: 194 mg/dL — AB (ref 70–99)
Glucose, Bld: 160 mg/dL — ABNORMAL HIGH (ref 70–99)
POTASSIUM: 4.6 meq/L (ref 3.7–5.3)
Potassium: 4.3 mEq/L (ref 3.7–5.3)
SODIUM: 137 meq/L (ref 137–147)
Sodium: 132 mEq/L — ABNORMAL LOW (ref 137–147)
TOTAL PROTEIN: 6.7 g/dL (ref 6.0–8.3)
TOTAL PROTEIN: 6.2 g/dL (ref 6.0–8.3)
Total Bilirubin: 0.3 mg/dL (ref 0.3–1.2)
Total Bilirubin: 0.2 mg/dL — ABNORMAL LOW (ref 0.3–1.2)

## 2013-06-07 LAB — URINE CULTURE
Colony Count: NO GROWTH
Culture: NO GROWTH

## 2013-06-07 LAB — LEGIONELLA ANTIGEN, URINE: Legionella Antigen, Urine: NEGATIVE

## 2013-06-07 LAB — CBC WITH DIFFERENTIAL/PLATELET
Basophils Absolute: 0 10*3/uL (ref 0.0–0.1)
Basophils Relative: 0 % (ref 0–1)
EOS PCT: 0 % (ref 0–5)
Eosinophils Absolute: 0 10*3/uL (ref 0.0–0.7)
HEMATOCRIT: 34.9 % — AB (ref 36.0–46.0)
Hemoglobin: 10.9 g/dL — ABNORMAL LOW (ref 12.0–15.0)
LYMPHS ABS: 1.5 10*3/uL (ref 0.7–4.0)
Lymphocytes Relative: 17 % (ref 12–46)
MCH: 28.5 pg (ref 26.0–34.0)
MCHC: 31.2 g/dL (ref 30.0–36.0)
MCV: 91.1 fL (ref 78.0–100.0)
MONO ABS: 0.4 10*3/uL (ref 0.1–1.0)
MONOS PCT: 5 % (ref 3–12)
NEUTROS ABS: 6.7 10*3/uL (ref 1.7–7.7)
Neutrophils Relative %: 78 % — ABNORMAL HIGH (ref 43–77)
Platelets: 202 10*3/uL (ref 150–400)
RBC: 3.83 MIL/uL — ABNORMAL LOW (ref 3.87–5.11)
RDW: 15.2 % (ref 11.5–15.5)
WBC Morphology: INCREASED
WBC: 8.6 10*3/uL (ref 4.0–10.5)

## 2013-06-07 LAB — GLUCOSE, CAPILLARY
GLUCOSE-CAPILLARY: 114 mg/dL — AB (ref 70–99)
Glucose-Capillary: 128 mg/dL — ABNORMAL HIGH (ref 70–99)
Glucose-Capillary: 102 mg/dL — ABNORMAL HIGH (ref 70–99)

## 2013-06-07 LAB — INFLUENZA PANEL BY PCR (TYPE A & B)
H1N1 flu by pcr: NOT DETECTED
Influenza A By PCR: POSITIVE — AB
Influenza B By PCR: NEGATIVE

## 2013-06-07 LAB — STREP PNEUMONIAE URINARY ANTIGEN: STREP PNEUMO URINARY ANTIGEN: NEGATIVE

## 2013-06-07 LAB — MRSA PCR SCREENING: MRSA by PCR: NEGATIVE

## 2013-06-07 LAB — PROCALCITONIN: Procalcitonin: 0.4 ng/mL

## 2013-06-07 LAB — I-STAT CG4 LACTIC ACID, ED: LACTIC ACID, VENOUS: 0.68 mmol/L (ref 0.5–2.2)

## 2013-06-07 MED ORDER — FENTANYL CITRATE 0.05 MG/ML IJ SOLN
INTRAMUSCULAR | Status: AC | PRN
  Administered 2013-06-07 (×2): 25 ug via INTRAVENOUS

## 2013-06-07 MED ORDER — MORPHINE SULFATE 2 MG/ML IJ SOLN
1.0000 mg | INTRAMUSCULAR | Status: DC | PRN
  Administered 2013-06-07 – 2013-06-14 (×9): 2 mg via INTRAVENOUS
  Filled 2013-06-07 (×10): qty 1

## 2013-06-07 MED ORDER — TRAMADOL HCL 50 MG PO TABS
25.0000 mg | ORAL_TABLET | Freq: Two times a day (BID) | ORAL | Status: DC | PRN
  Administered 2013-06-07 – 2013-06-11 (×4): 25 mg via ORAL
  Filled 2013-06-07 (×5): qty 1

## 2013-06-07 MED ORDER — OSELTAMIVIR PHOSPHATE 30 MG PO CAPS
30.0000 mg | ORAL_CAPSULE | Freq: Every day | ORAL | Status: AC
  Administered 2013-06-07 – 2013-06-11 (×5): 30 mg via ORAL
  Filled 2013-06-07 (×5): qty 1

## 2013-06-07 MED ORDER — DEXTROSE-NACL 5-0.45 % IV SOLN
INTRAVENOUS | Status: DC
  Administered 2013-06-07: 1000 mL via INTRAVENOUS
  Administered 2013-06-08 – 2013-06-15 (×4): via INTRAVENOUS
  Filled 2013-06-07: qty 1000

## 2013-06-07 MED ORDER — ALBUTEROL SULFATE (2.5 MG/3ML) 0.083% IN NEBU
2.5000 mg | INHALATION_SOLUTION | Freq: Two times a day (BID) | RESPIRATORY_TRACT | Status: DC
  Administered 2013-06-07 – 2013-06-20 (×27): 2.5 mg via RESPIRATORY_TRACT
  Filled 2013-06-07 (×27): qty 3

## 2013-06-07 MED ORDER — VANCOMYCIN HCL 10 G IV SOLR
1250.0000 mg | INTRAVENOUS | Status: DC
  Administered 2013-06-07 – 2013-06-10 (×4): 1250 mg via INTRAVENOUS
  Filled 2013-06-07 (×4): qty 1250

## 2013-06-07 MED ORDER — LEVOTHYROXINE SODIUM 50 MCG PO TABS
50.0000 ug | ORAL_TABLET | Freq: Every day | ORAL | Status: DC
Start: 2013-06-07 — End: 2013-06-20
  Administered 2013-06-07 – 2013-06-20 (×14): 50 ug via ORAL
  Filled 2013-06-07 (×15): qty 1

## 2013-06-07 MED ORDER — METRONIDAZOLE IN NACL 5-0.79 MG/ML-% IV SOLN
500.0000 mg | Freq: Once | INTRAVENOUS | Status: AC
  Administered 2013-06-07: 500 mg via INTRAVENOUS
  Filled 2013-06-07: qty 100

## 2013-06-07 MED ORDER — LORAZEPAM 2 MG/ML IJ SOLN
INTRAMUSCULAR | Status: AC | PRN
  Administered 2013-06-07: 0.5 mg via INTRAVENOUS

## 2013-06-07 MED ORDER — FENTANYL CITRATE 0.05 MG/ML IJ SOLN
INTRAMUSCULAR | Status: AC
  Filled 2013-06-07: qty 4

## 2013-06-07 MED ORDER — SODIUM CHLORIDE 0.9 % IV SOLN
INTRAVENOUS | Status: AC | PRN
  Administered 2013-06-07: 30 mL/h via INTRAVENOUS

## 2013-06-07 MED ORDER — WARFARIN SODIUM 3 MG PO TABS
3.0000 mg | ORAL_TABLET | Freq: Once | ORAL | Status: DC
  Filled 2013-06-07: qty 1

## 2013-06-07 MED ORDER — GUAIFENESIN ER 600 MG PO TB12
600.0000 mg | ORAL_TABLET | Freq: Two times a day (BID) | ORAL | Status: DC
  Administered 2013-06-07 – 2013-06-20 (×27): 600 mg via ORAL
  Filled 2013-06-07 (×28): qty 1

## 2013-06-07 MED ORDER — HEPARIN SODIUM (PORCINE) 5000 UNIT/ML IJ SOLN
5000.0000 [IU] | Freq: Three times a day (TID) | INTRAMUSCULAR | Status: DC
  Administered 2013-06-07 – 2013-06-12 (×14): 5000 [IU] via SUBCUTANEOUS
  Filled 2013-06-07 (×19): qty 1

## 2013-06-07 MED ORDER — MIDAZOLAM HCL 2 MG/2ML IJ SOLN
INTRAMUSCULAR | Status: AC
  Filled 2013-06-07: qty 4

## 2013-06-07 MED ORDER — DEXTROSE 5 % IV SOLN
1.0000 g | Freq: Three times a day (TID) | INTRAVENOUS | Status: DC
  Filled 2013-06-07 (×2): qty 1

## 2013-06-07 MED ORDER — BUDESONIDE 0.25 MG/2ML IN SUSP
0.2500 mg | Freq: Two times a day (BID) | RESPIRATORY_TRACT | Status: DC
  Administered 2013-06-07 – 2013-06-20 (×26): 0.25 mg via RESPIRATORY_TRACT
  Filled 2013-06-07 (×30): qty 2

## 2013-06-07 MED ORDER — MIDAZOLAM HCL 2 MG/2ML IJ SOLN
INTRAMUSCULAR | Status: AC | PRN
  Administered 2013-06-07: 0.5 mg via INTRAVENOUS

## 2013-06-07 MED ORDER — DEXTROSE 5 % IV SOLN
1.0000 g | INTRAVENOUS | Status: DC
  Administered 2013-06-07 – 2013-06-13 (×7): 1 g via INTRAVENOUS
  Filled 2013-06-07 (×7): qty 1

## 2013-06-07 MED ORDER — WARFARIN - PHARMACIST DOSING INPATIENT
Freq: Every day | Status: DC
Start: 2013-06-07 — End: 2013-06-15
  Administered 2013-06-08: 18:00:00

## 2013-06-07 MED ORDER — BISOPROLOL FUMARATE 5 MG PO TABS
5.0000 mg | ORAL_TABLET | Freq: Every day | ORAL | Status: DC
  Administered 2013-06-07 – 2013-06-20 (×14): 5 mg via ORAL
  Filled 2013-06-07 (×14): qty 1

## 2013-06-07 MED ORDER — PREDNISONE 10 MG PO TABS
10.0000 mg | ORAL_TABLET | Freq: Every day | ORAL | Status: DC
  Administered 2013-06-07 – 2013-06-20 (×14): 10 mg via ORAL
  Filled 2013-06-07 (×15): qty 1

## 2013-06-07 MED ORDER — TIOTROPIUM BROMIDE MONOHYDRATE 18 MCG IN CAPS
18.0000 ug | ORAL_CAPSULE | Freq: Every day | RESPIRATORY_TRACT | Status: DC
  Administered 2013-06-07 – 2013-06-20 (×14): 18 ug via RESPIRATORY_TRACT
  Filled 2013-06-07 (×4): qty 5

## 2013-06-07 NOTE — Sedation Documentation (Signed)
O2 increased to 3L [place].

## 2013-06-07 NOTE — ED Provider Notes (Signed)
Date: 06/07/2013  Rate: 116  Rhythm: atrial fibrillation  QRS Axis: normal  Intervals: indeterminate  ST/T Wave abnormalities: normal  Conduction Disutrbances:none  Narrative Interpretation: No ST or T wave changes consistent with ischemia.    Old EKG Reviewed: unchanged    Junius ArgyleForrest S Skyrah Krupp, MD 06/07/13 [phone]

## 2013-06-07 NOTE — Procedures (Signed)
Interventional Radiology Procedure Note  Procedure: Placement of a CT guided 15F drain into the left pelvic sidewall abscess.   15 mL purulent fluid aspirated.  Complications: None Recommendations: - Cx pending - Tube injection under fluoro prior to tube removal  Signed,  Sterling BigHeath K. McCullough, MD [facility]

## 2013-06-07 NOTE — Care Management Note (Addendum)
    Page 1 of 2   06/21/2013     8:31:00 AM   CARE MANAGEMENT NOTE 06/21/2013  Patient:  [name],[name]   Account Number:  [id number]  Date Initiated:  06/07/2013  Documentation initiated by:  Burch,Heidi  Subjective/Objective Assessment:   Admitted with colonic diverticular abscess     Action/Plan:   place JP drain//[facility]   Anticipated DC Date:  06/10/2013   Anticipated DC Plan:  HOME W HOME HEALTH SERVICES      DC Planning Services  CM consult      [facility]   Choice offered to / List presented to:  C-1 Patient        HH arranged  HH-1 RN  HH-10 DISEASE MANAGEMENT  HH-2 PT  HH-4 NURSE'S AIDE      HH agency  [facility]   Status of service:  Completed, signed off Medicare Important Message given?   (If response is "NO", the following Medicare IM given date fields will be blank) Date Medicare IM given:   Date Additional Medicare IM given:    Discharge Disposition:  LONG TERM ACUTE CARE (LTAC)  Per UR Regulation:  Reviewed for med. necessity/level of care/duration of stay  If discussed at Long Length of Stay Meetings, dates discussed:   06/13/2013  06/18/2013  06/20/2013    Comments:  06/21/2013 D/c to [facility]AC[facility] on 06/20/2013 Heidi Hutchinson RN, BSN, MSHL, CCM 06/21/2013  06/20/2013 LOS RECS:  LTAC  rather than CIR d/t patient continues to be on surgery watch and some progress made with fistula decreasing in size.  Continues to have leaking feculent material drainage around closed system drainage Left side. Remains on Hep gtt d/t possible surgery plan. [facility]AC contact [name] confirms screening appropriate for LTAC and bed open. Attending: Dr. Gwenlyn PerkingMadera notified.   MD plans to contact surgeons to discuss plan. Dispositon Plan:  In progress Heidi Hutchinson RN, BSN, MSHL, CCM 06/20/2013  06/19/2013 imaging update: fistula is smaller. Surgical Procedure:  Bowel resection and colonoscopy has not been  scheduled. Remains on Hep until surgery plan confirmed. IV VANC CIR: Yes pending insurance approval and bed Heidi Hutchinson RN, BSN, MSHL, CCM 06/19/2013  06/18/2013 Colonic diverticular abscess, COPD, Chronic Diastolic HF (Colocutaneous fistula) Feculent material draining from around drainage tube site. IV ABXs Scheduled for CT for drain study to confirm fistual and surgical plan Social:  From home with son PT Recs:  HH PT - 24 hour supervision or SNF Dispositon Plan: HHS: RN, CNA, PT ([facility] - notifed 06/13/13 however patient scheduled for surgery:  Bowel Resection and Colostomy 06/18/2013 which may change disposition plan. Heidi Hutchinson RN, BSN, [place], CCM 06/18/2013   06/13/13 1300 Heidi Lucretia RoersWood, RN, BSN, [employer] CorporationCM [phone] Spoke with pt at bedside regarding [facility]. Offered pt list of home health agencies to choose from.  Pt chose [facility] to render services RN/NA/PT. Heidi Furnishonna Fellmy, [facility] notified. No DME needs identified at this time.

## 2013-06-07 NOTE — H&P (Signed)
Agree with PA note.  Window is small and subject to change given body habitus and mobility of bowel.  Will attempt with CT guidance, if no window available at time of procedure, may have to treat with IV Abx and re-image in 2-3 days.   Signed,  Sterling BigHeath K. McCullough, MD [facility]

## 2013-06-07 NOTE — Progress Notes (Signed)
Admitted pt, AOx4, on nasal cannula at 2L, VS were taken to chart, not in respiratory distress, denies chest pain, no nausea and vomiting. Nasal swab was done for the MRSA pcr and sent to the lab. Put on telemetry and bed alarm. Dr. Allena KatzPatel was informed of this admission. Will continue to monitor pt

## 2013-06-07 NOTE — ED Notes (Signed)
[phone] Heidi EdgeKyle Burch (son ) / [phone] Heidi JuneKim Burch ( daughter -in - law ).

## 2013-06-07 NOTE — Progress Notes (Signed)
[facility] PROGRESS NOTE  Nolberto HanlonCynthia A Hitson ZOX:[id number] DOB: 11/12/1930 DOA: 06/06/2013 PCP: Shan LevansPatrick Wright, MD  Assessment/Plan: 1-toxic encephalopathy: from diverticular abscess, PNA and influenza. -continue abx's, supportive care and tamiflu -PRN antiemetics and pain meds  2-HCAP/influenza: will continue treatment with vanc and cefepime; continue tamiflu. -continue supportive care  3-Atrial fibrillation: rate controlled. -will start heparin per pharmacy; holding coumadin in case patient needs surgery  4-COPD with chronic resp failure: stable and actively no wheezing -will continue home inhalers -chronic prednisone and oxygen supplementation  5-CKD: stable and at baseline. Will monitor  6-hypothyroidism: continue synthroid  7-chronic diastolic heart failure: compensated at this moment. -will check daily weights and strict I's and O's.   Code Status: Full Family Communication: no family at bedside  Disposition Plan: to be determine   Consultants:  General surgery  IR   Procedures:  See below for x-ray reports  Abscess drain placement by IR (2/27)  Antibiotics:  vanc and cefepime   HPI/Subjective: Low grade fever; still complaining of pain in her abd. Patient is AAOX3. Positive influenza A by PCR  Objective: Filed Vitals:   06/07/13 0539  BP:   Pulse:   Temp: 99.1 F (37.3 C)  Resp:     Intake/Output Summary (Last 24 hours) at 06/07/13 1134 Last data filed at 06/07/13 0543  Gross per 24 hour  Intake   2300 ml  Output    300 ml  Net   2000 ml   Filed Weights   06/07/13 0322  Weight: 83.3 kg (183 lb 10.3 oz)    Exam:   General:  AAOX3, low grade fever currently  Cardiovascular: irregular, no rubs or gallops  Respiratory: no wheezing; positive rhonchi  Abdomen: LLQ tenderness, positive BS, no guarding, no distension  Musculoskeletal: no joint swelling.  Data Reviewed: Basic Metabolic Panel:  Recent Labs Lab 06/06/13 2244  06/06/13 2333 06/07/13 0426  NA 132* 135* 137  K 4.6 4.6 4.3  CL 89* 95* 97  CO2 25  --  25  GLUCOSE 194* 198* 160*  BUN 62* 77* 57*  CREATININE 2.02* 2.10* 1.78*  CALCIUM 8.6  --  8.3*   Liver Function Tests:  Recent Labs Lab 06/06/13 2244 06/07/13 0426  AST 48* 32  ALT 49* 38*  ALKPHOS 230* 201*  BILITOT 0.3 0.2*  PROT 6.7 6.2  ALBUMIN 2.7* 2.4*    Recent Labs Lab 06/06/13 2244  LIPASE 20   CBC:  Recent Labs Lab 06/06/13 2244 06/06/13 2333 06/07/13 0426  WBC 11.2*  --  8.6  NEUTROABS 9.6*  --  6.7  HGB 11.4* 12.9 10.9*  HCT 36.6 38.0 34.9*  MCV 91.3  --  91.1  PLT 231  --  202   BNP (last 3 results)  Recent Labs  07/25/12 0822 12/05/12 1558 06/06/13 2244  PROBNP 399.0* 277.0* 1764.0*   CBG:  Recent Labs Lab 06/06/13 2032  GLUCAP 152*    Recent Results (from the past 240 hour(s))  MRSA PCR SCREENING     Status: None   Collection Time    06/07/13  3:23 AM      Result Value Ref Range Status   MRSA by PCR NEGATIVE  NEGATIVE Final   Comment:            The GeneXpert MRSA Assay (FDA     approved for NASAL specimens     only), is one component of a     comprehensive MRSA colonization     surveillance  program. It is not     intended to diagnose MRSA     infection nor to guide or     monitor treatment for     MRSA infections.     Studies: Ct Abdomen Pelvis Wo Contrast  06/07/2013   CLINICAL DATA:  Altered mental status, fever and weakness. Abdominal pain, nausea and vomiting.  EXAM: CT ABDOMEN AND PELVIS WITHOUT CONTRAST  TECHNIQUE: Multidetector CT imaging of the abdomen and pelvis was performed following the standard protocol without intravenous contrast.  COMPARISON:  CT of the abdomen and pelvis performed 03/20/2013, and abdominal ultrasound performed 03/21/2013  FINDINGS: Mild left basilar atelectasis is noted.  The mildly nodular contour of the liver may reflect mild cirrhotic change. A few small stones are seen layering dependently  within the gallbladder; the gallbladder is otherwise unremarkable in appearance. The pancreas and adrenal glands are unremarkable.  Mild bilateral renal atrophy is noted. A 1.6 cm cyst is noted arising along the posterior aspect of the right kidney. There is no evidence of hydronephrosis. No renal or ureteral stones are seen. No significant perinephric stranding is appreciated.  No free fluid is identified. The small bowel is unremarkable in appearance. The stomach is within normal limits. No acute vascular abnormalities are seen. There is diffuse tortuosity of the abdominal aorta, with slight ectasia of the distal abdominal aorta. Diffuse calcification is seen along the abdominal aorta and its branches.  The appendix is normal in caliber, without evidence for appendicitis. Contrast progresses to the level of the mid transverse colon. Diffuse diverticulosis is noted along the sigmoid colon.  There is a enlarged 4.6 x 3.6 x 2.9 cm collection of fluid and air tracking adjacent to the uterus and left side of the bladder, containing minimal high density debris. There is also an enlarging collection of fluid and air noted along the upper left pelvic sidewall, measuring 6.3 x 2.5 x 4.4 cm. These are directly adjacent to the left ovary.  On comparison with multiple prior CTs, the patient did not have a large colonic diverticulum in this region. Stool within the pelvic sidewall collection on the prior study likely reflected a fistulous connection to a complex abscess in the pelvis. An inflamed diverticulum would not demonstrate this degree of enlargement over time. Surrounding soft tissue inflammation is seen. Communication between these abscesses is not well assessed.  The bladder is moderately distended and grossly unremarkable in appearance, though displaced to the right by the patient's abscess. The uterus is difficult to fully assess, as it extends directly adjacent to the abscess, but appears grossly unremarkable,  aside from a single tiny focus of air at the cervix, likely within normal limits. No inguinal lymphadenopathy is seen.  No acute osseous abnormalities are identified. 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  IMPRESSION: 1. Enlarging mildly complex abscesses noted within the pelvis. 4.6 x 3.6 x 2.9 cm collection of fluid and air tracking adjacent to the uterus and left side of the bladder has increased in size from the prior study, and now contains minimal high density debris. An additional enlarging collection of fluid and air along the upper left pelvic sidewall measures 6.3 x 2.5 x 4.4 cm. Stool was noted in the pelvic sidewall collection on the prior study, likely reflecting a fistulous connection to the abscess. No large colonic diverticula were present in this region on prior CTs to suggest a thick-walled diverticulum, and a colonic diverticulum would not demonstrate this degree of enlargement  and irregularity over time. Surrounding soft tissue inflammation noted. 2. Diffuse diverticulosis along the sigmoid colon. 3. Cholelithiasis; gallbladder otherwise unremarkable in appearance. 4. Mild bilateral renal atrophy seen.  Small right renal cyst noted. 5. Diffuse calcification along the abdominal aorta and its branches. 6. Mildly nodular contour of the liver may reflect mild cirrhotic change. 7. Mild left basilar atelectasis noted.  These results were called by telephone at the time of interpretation on 06/07/2013 at 1:51 AM to [facility] PA, who verbally acknowledged these results.   Electronically Signed   By: Roanna Raider M.D.   On: 06/07/2013 01:54   Dg Chest 2 View  06/06/2013   CLINICAL DATA:  Weakness cough  EXAM: CHEST  2 VIEW  COMPARISON:  03/21/2013 prior chest radiographs  FINDINGS: Cardiomegaly noted.  Left lower lobe opacity is suspicious for pneumonia.  There is no evidence of pulmonary edema, suspicious pulmonary nodule/mass, pleural effusion, or pneumothorax. No acute  bony abnormalities are identified.  IMPRESSION: Probable left lower lobe pneumonia -radiographic followup to resolution recommended.  Cardiomegaly.   Electronically Signed   By: Laveda Abbe M.D.   On: 06/06/2013 21:22   Ct Head Wo Contrast  06/07/2013   CLINICAL DATA:  Altered mental status, weakness and fever. Abdominal pain, nausea and vomiting.  EXAM: CT HEAD WITHOUT CONTRAST  TECHNIQUE: Contiguous axial images were obtained from the base of the skull through the vertex without intravenous contrast.  COMPARISON:  None.  FINDINGS: There is no evidence of acute infarction, mass lesion, or intra- or extra-axial hemorrhage on CT.  Prominence of the ventricles and sulci reflects mild cortical volume loss. Scattered periventricular and subcortical white matter change likely reflects small vessel ischemic microangiopathy. Mild cerebellar atrophy is noted.  The brainstem and fourth ventricle are within normal limits. The basal ganglia are unremarkable in appearance. The cerebral hemispheres demonstrate grossly normal gray-white differentiation. No mass effect or midline shift is seen.  There is no evidence of fracture; visualized osseous structures are unremarkable in appearance. The orbits are within normal limits. A mucus retention cyst or polyp is noted at the left maxillary sinus. There is opacification of the right side of the sphenoid sinus. The remaining paranasal sinuses and mastoid air cells are well-aerated. No significant soft tissue abnormalities are seen.  IMPRESSION: 1. No acute intracranial pathology seen on CT. 2. Mild cortical volume loss and scattered small vessel ischemic microangiopathy. 3. Mucus retention cyst or polyp at the left maxillary sinus; opacification of the right side of the sphenoid sinus.   Electronically Signed   By: Roanna Raider M.D.   On: 06/07/2013 01:26    Scheduled Meds: . albuterol  2.5 mg Nebulization BID  . bisoprolol  5 mg Oral Daily  . budesonide (PULMICORT) nebulizer  solution  0.25 mg Nebulization BID  . ceFEPime (MAXIPIME) IV  1 g Intravenous Q24H  . guaiFENesin  600 mg Oral BID  . heparin  5,000 Units Subcutaneous 3 times per day  . levothyroxine  50 mcg Oral QAC breakfast  . oseltamivir  30 mg Oral Daily  . predniSONE  10 mg Oral Q breakfast  . tiotropium  18 mcg Inhalation Daily  . vancomycin  1,250 mg Intravenous Q24H  . Warfarin - Pharmacist Dosing Inpatient   Does not apply q1800   Continuous Infusions:   Principal Problem:   Colonic diverticular abscess Active Problems:   HYPOTHYROIDISM   Atrial fibrillation   DIASTOLIC HEART FAILURE, CHRONIC   COPD with emphysema  Osteoarthritis   Oxygen dependent   Chronic gout due to renal impairment   Chronic kidney disease (CKD), stage IV     Time spent: >30 minutes    Izabell Schalk  [facility] Pager [phone]. If 7PM-7AM, please contact night-coverage at [email or web address], password [facility] 06/07/2013, 11:34 AM  LOS: 1 day

## 2013-06-07 NOTE — ED Provider Notes (Signed)
Medical screening examination/treatment/procedure(s) were performed by non-physician practitioner and as supervising physician I was immediately available for consultation/collaboration.  EKG Interpretation  None    Osamah Schmader S Cheron Pasquarelli, MD 06/07/13 1335 

## 2013-06-07 NOTE — Consult Note (Signed)
Reason for Consult:Diverticulitis with abscess Referring Physician: Hazel Sams, PAC  [name] is an 80 y.o. female.  HPI: Patient presented to the emergency room today with fever and lower abdominal pain. She was hospitalized in December of 2014. During that time she was followed by our service for sigmoid diverticulitis. There was a question at that time regarding possible pelvic abscess. It was determined in consultation with interventional radiology to be not a true abscess. She was treated with bowel rest and IV antibiotics. She improved. Since discharge, however, she has continued to have some lower abdominal pain. This became worse prompting return to the emergency department tonight. Workup included laboratory studies Showing mild leukocytosis. Chest x-ray shows infiltrate consistent with pneumonia. CT scan of the abdomen and pelvis demonstrates more clearly seen sigmoid diverticulitis with associated abscess. No generalized free intraperitoneal air.  Past Medical History  Diagnosis Date  . Diabetes mellitus   . Obesity   . COPD (chronic obstructive pulmonary disease)     Moderate. PFTs (12/10): FVC 67%, FEV1 58%, ratio 60%, TLC 95%, RV 138%, DLCO 43% (moderate obstructive defect). She is on home oxygen that should be worn at all times.  . Diastolic CHF, chronic     Echo (3/12): EF 55-60%, mild LV hypertrophy, grade I diastolic dysfunction, mild left atrial enlargement, normal pulmonary artery pressure  . CKD (chronic kidney disease)     Cr 2.4 when last checked. ANCA +, pauci-immune glomerulnephritis followed by Dr. Marval Regal  . PNA (pneumonia)     h/o with ARDS in 2005; MRSA PNA with prolonged hospitalization in [facility] 11/11  . Diverticulum     Jejunal  . OSA (obstructive sleep apnea)     possible. When pt was sedated for TEE in May 2010, she did develop some upper airway obstruction  . Anemia     of chronic disease  . Hypothyroidism   . Gout   . Atrial  fibrillation 07/2008    Failed cardioversion with TEE guidance in May 2010. Pt went back to NSR by 11/2008 with amiodarne  . Hypertension   . GERD (gastroesophageal reflux disease)   . Peripheral vascular disease   . Pneumonia   . Chronic kidney disease   . Respiratory failure, chronic 07/01/2011  . Chronic gout due to renal impairment 07/01/2011    Past Surgical History  Procedure Laterality Date  . Tracheostomy      ARDS/ ICU admission, trach 2005  . Microperforation  08/2008    Of jejunem, ICU admission    Family History  Problem Relation Age of Onset  . Emphysema Mother     and asthma  . Asthma Mother   . Heart disease Neg Hx     premature    Social History:  reports that she quit smoking about 37 years ago. Her smoking use included Cigarettes. She has a 23 pack-year smoking history. She has never used smokeless tobacco. She reports that she drinks alcohol. She reports that she does not use illicit drugs.  Allergies:  Allergies  Allergen Reactions  . Sulfonamide Derivatives Other (See Comments)    Childhood reaction    Medications:  Prior to Admission:  (Not in a hospital admission) Scheduled: . azithromycin  500 mg Oral Q24H   Continuous: . cefTRIAXone (ROCEPHIN)  IV    . metronidazole 500 mg (06/07/13 0210)   PRN:  Results for orders placed during the hospital encounter of 06/06/13 (from the past 48 hour(s))  URINALYSIS, ROUTINE W REFLEX MICROSCOPIC  Status: Abnormal   Collection Time    06/06/13  8:25 PM      Result Value Ref Range   Color, Urine YELLOW  YELLOW   APPearance CLEAR  CLEAR   Specific Gravity, Urine 1.012  1.005 - 1.030   pH 5.0  5.0 - 8.0   Glucose, UA NEGATIVE  NEGATIVE mg/dL   Hgb urine dipstick MODERATE (*) NEGATIVE   Bilirubin Urine NEGATIVE  NEGATIVE   Ketones, ur NEGATIVE  NEGATIVE mg/dL   Protein, ur NEGATIVE  NEGATIVE mg/dL   Urobilinogen, UA 0.2  0.0 - 1.0 mg/dL   Nitrite NEGATIVE  NEGATIVE   Leukocytes, UA TRACE (*)  NEGATIVE  URINE MICROSCOPIC-ADD ON     Status: Abnormal   Collection Time    06/06/13  8:25 PM      Result Value Ref Range   Squamous Epithelial / LPF RARE  RARE   WBC, UA 0-2  <3 WBC/hpf   RBC / HPF 7-10  <3 RBC/hpf   Bacteria, UA RARE  RARE   Casts HYALINE CASTS (*) NEGATIVE   Urine-Other RARE YEAST    CBG MONITORING, ED     Status: Abnormal   Collection Time    06/06/13  8:32 PM      Result Value Ref Range   Glucose-Capillary 152 (*) 70 - 99 mg/dL  CBC WITH DIFFERENTIAL     Status: Abnormal   Collection Time    06/06/13 10:44 PM      Result Value Ref Range   WBC 11.2 (*) 4.0 - 10.5 K/uL   RBC 4.01  3.87 - 5.11 MIL/uL   Hemoglobin 11.4 (*) 12.0 - 15.0 g/dL   HCT 36.6  36.0 - 46.0 %   MCV 91.3  78.0 - 100.0 fL   MCH 28.4  26.0 - 34.0 pg   MCHC 31.1  30.0 - 36.0 g/dL   RDW 15.0  11.5 - 15.5 %   Platelets 231  150 - 400 K/uL   Neutrophils Relative % 86 (*) 43 - 77 %   Neutro Abs 9.6 (*) 1.7 - 7.7 K/uL   Lymphocytes Relative 10 (*) 12 - 46 %   Lymphs Abs 1.1  0.7 - 4.0 K/uL   Monocytes Relative 4  3 - 12 %   Monocytes Absolute 0.5  0.1 - 1.0 K/uL   Eosinophils Relative 0  0 - 5 %   Eosinophils Absolute 0.0  0.0 - 0.7 K/uL   Basophils Relative 0  0 - 1 %   Basophils Absolute 0.0  0.0 - 0.1 K/uL  COMPREHENSIVE METABOLIC PANEL     Status: Abnormal   Collection Time    06/06/13 10:44 PM      Result Value Ref Range   Sodium 132 (*) 137 - 147 mEq/L   Potassium 4.6  3.7 - 5.3 mEq/L   Chloride 89 (*) 96 - 112 mEq/L   CO2 25  19 - 32 mEq/L   Glucose, Bld 194 (*) 70 - 99 mg/dL   BUN 62 (*) 6 - 23 mg/dL   Creatinine, Ser 2.02 (*) 0.50 - 1.10 mg/dL   Calcium 8.6  8.4 - 10.5 mg/dL   Total Protein 6.7  6.0 - 8.3 g/dL   Albumin 2.7 (*) 3.5 - 5.2 g/dL   AST 48 (*) 0 - 37 U/L   ALT 49 (*) 0 - 35 U/L   Alkaline Phosphatase 230 (*) 39 - 117 U/L   Total Bilirubin 0.3  0.3 -  1.2 mg/dL   GFR calc non Af Amer 22 (*) >90 mL/min   GFR calc Af Amer 26 (*) >90 mL/min   Comment: (NOTE)      The eGFR has been calculated using the CKD EPI equation.     This calculation has not been validated in all clinical situations.     eGFR's persistently <90 mL/min signify possible Chronic Kidney     Disease.  PROTIME-INR     Status: Abnormal   Collection Time    06/06/13 10:44 PM      Result Value Ref Range   Prothrombin Time 15.4 (*) 11.6 - 15.2 seconds   INR 1.25  0.00 - 1.49  PROCALCITONIN     Status: None   Collection Time    06/06/13 10:44 PM      Result Value Ref Range   Procalcitonin 0.40     Comment:            Interpretation:     PCT (Procalcitonin) <= 0.5 ng/mL:     Systemic infection (sepsis) is not likely.     Local bacterial infection is possible.     (NOTE)             ICU PCT Algorithm               Non ICU PCT Algorithm        ----------------------------     ------------------------------             PCT < 0.25 ng/mL                 PCT < 0.1 ng/mL         Stopping of antibiotics            Stopping of antibiotics           strongly encouraged.               strongly encouraged.        ----------------------------     ------------------------------           PCT level decrease by               PCT < 0.25 ng/mL           >= 80% from peak PCT           OR PCT 0.25 - 0.5 ng/mL          Stopping of antibiotics                                                 encouraged.         Stopping of antibiotics               encouraged.        ----------------------------     ------------------------------           PCT level decrease by              PCT >= 0.25 ng/mL           < 80% from peak PCT            AND PCT >= 0.5 ng/mL            Continuing antibiotics  encouraged.           Continuing antibiotics                encouraged.        ----------------------------     ------------------------------         PCT level increase compared          PCT > 0.5 ng/mL             with peak PCT AND              PCT >= 0.5  ng/mL             Escalation of antibiotics                                              strongly encouraged.          Escalation of antibiotics            strongly encouraged.  PRO B NATRIURETIC PEPTIDE     Status: Abnormal   Collection Time    06/06/13 10:44 PM      Result Value Ref Range   Pro B Natriuretic peptide (BNP) 1764.0 (*) 0 - 450 pg/mL  LIPASE, BLOOD     Status: None   Collection Time    06/06/13 10:44 PM      Result Value Ref Range   Lipase 20  11 - 59 U/L  I-STAT CHEM 8, ED     Status: Abnormal   Collection Time    06/06/13 11:33 PM      Result Value Ref Range   Sodium 135 (*) 137 - 147 mEq/L   Potassium 4.6  3.7 - 5.3 mEq/L   Chloride 95 (*) 96 - 112 mEq/L   BUN 77 (*) 6 - 23 mg/dL   Creatinine, Ser 2.10 (*) 0.50 - 1.10 mg/dL   Glucose, Bld 198 (*) 70 - 99 mg/dL   Calcium, Ion 1.10 (*) 1.13 - 1.30 mmol/L   TCO2 29  0 - 100 mmol/L   Hemoglobin 12.9  12.0 - 15.0 g/dL   HCT 38.0  36.0 - 46.0 %  I-STAT CG4 LACTIC ACID, ED     Status: None   Collection Time    06/07/13  1:37 AM      Result Value Ref Range   Lactic Acid, Venous 0.68  0.5 - 2.2 mmol/L    Ct Abdomen Pelvis Wo Contrast  06/07/2013   CLINICAL DATA:  Altered mental status, fever and weakness. Abdominal pain, nausea and vomiting.  EXAM: CT ABDOMEN AND PELVIS WITHOUT CONTRAST  TECHNIQUE: Multidetector CT imaging of the abdomen and pelvis was performed following the standard protocol without intravenous contrast.  COMPARISON:  CT of the abdomen and pelvis performed 03/20/2013, and abdominal ultrasound performed 03/21/2013  FINDINGS: Mild left basilar atelectasis is noted.  The mildly nodular contour of the liver may reflect mild cirrhotic change. A few small stones are seen layering dependently within the gallbladder; the gallbladder is otherwise unremarkable in appearance. The pancreas and adrenal glands are unremarkable.  Mild bilateral renal atrophy is noted. A 1.6 cm cyst is noted arising along the posterior  aspect of the right kidney. There is no evidence of hydronephrosis. No renal or ureteral stones are seen. No significant perinephric stranding is appreciated.  No free fluid is identified. The small bowel  is unremarkable in appearance. The stomach is within normal limits. No acute vascular abnormalities are seen. There is diffuse tortuosity of the abdominal aorta, with slight ectasia of the distal abdominal aorta. Diffuse calcification is seen along the abdominal aorta and its branches.  The appendix is normal in caliber, without evidence for appendicitis. Contrast progresses to the level of the mid transverse colon. Diffuse diverticulosis is noted along the sigmoid colon.  There is a enlarged 4.6 x 3.6 x 2.9 cm collection of fluid and air tracking adjacent to the uterus and left side of the bladder, containing minimal high density debris. There is also an enlarging collection of fluid and air noted along the upper left pelvic sidewall, measuring 6.3 x 2.5 x 4.4 cm. These are directly adjacent to the left ovary.  On comparison with multiple prior CTs, the patient did not have a large colonic diverticulum in this region. Stool within the pelvic sidewall collection on the prior study likely reflected a fistulous connection to a complex abscess in the pelvis. An inflamed diverticulum would not demonstrate this degree of enlargement over time. Surrounding soft tissue inflammation is seen. Communication between these abscesses is not well assessed.  The bladder is moderately distended and grossly unremarkable in appearance, though displaced to the right by the patient's abscess. The uterus is difficult to fully assess, as it extends directly adjacent to the abscess, but appears grossly unremarkable, aside from a single tiny focus of air at the cervix, likely within normal limits. No inguinal lymphadenopathy is seen.  No acute osseous abnormalities are identified. There is chronic osseous fusion of vertebral bodies L4 and  L5.  IMPRESSION: 1. Enlarging mildly complex abscesses noted within the pelvis. 4.6 x 3.6 x 2.9 cm collection of fluid and air tracking adjacent to the uterus and left side of the bladder has increased in size from the prior study, and now contains minimal high density debris. An additional enlarging collection of fluid and air along the upper left pelvic sidewall measures 6.3 x 2.5 x 4.4 cm. Stool was noted in the pelvic sidewall collection on the prior study, likely reflecting a fistulous connection to the abscess. No large colonic diverticula were present in this region on prior CTs to suggest a thick-walled diverticulum, and a colonic diverticulum would not demonstrate this degree of enlargement and irregularity over time. Surrounding soft tissue inflammation noted. 2. Diffuse diverticulosis along the sigmoid colon. 3. Cholelithiasis; gallbladder otherwise unremarkable in appearance. 4. Mild bilateral renal atrophy seen.  Small right renal cyst noted. 5. Diffuse calcification along the abdominal aorta and its branches. 6. Mildly nodular contour of the liver may reflect mild cirrhotic change. 7. Mild left basilar atelectasis noted.  These results were called by telephone at the time of interpretation on 06/07/2013 at 1:51 AM to [facility], who verbally acknowledged these results.   Electronically Signed   By: Garald Balding M.D.   On: 06/07/2013 01:54   Dg Chest 2 View  06/06/2013   CLINICAL DATA:  Weakness cough  EXAM: CHEST  2 VIEW  COMPARISON:  03/21/2013 prior chest radiographs  FINDINGS: Cardiomegaly noted.  Left lower lobe opacity is suspicious for pneumonia.  There is no evidence of pulmonary edema, suspicious pulmonary nodule/mass, pleural effusion, or pneumothorax. No acute bony abnormalities are identified.  IMPRESSION: Probable left lower lobe pneumonia -radiographic followup to resolution recommended.  Cardiomegaly.   Electronically Signed   By: Hassan Rowan M.D.   On: 06/06/2013 21:22   Ct Head  Wo Contrast  06/07/2013   CLINICAL DATA:  Altered mental status, weakness and fever. Abdominal pain, nausea and vomiting.  EXAM: CT HEAD WITHOUT CONTRAST  TECHNIQUE: Contiguous axial images were obtained from the base of the skull through the vertex without intravenous contrast.  COMPARISON:  None.  FINDINGS: There is no evidence of acute infarction, mass lesion, or intra- or extra-axial hemorrhage on CT.  Prominence of the ventricles and sulci reflects mild cortical volume loss. Scattered periventricular and subcortical white matter change likely reflects small vessel ischemic microangiopathy. Mild cerebellar atrophy is noted.  The brainstem and fourth ventricle are within normal limits. The basal ganglia are unremarkable in appearance. The cerebral hemispheres demonstrate grossly normal gray-white differentiation. No mass effect or midline shift is seen.  There is no evidence of fracture; visualized osseous structures are unremarkable in appearance. The orbits are within normal limits. A mucus retention cyst or polyp is noted at the left maxillary sinus. There is opacification of the right side of the sphenoid sinus. The remaining paranasal sinuses and mastoid air cells are well-aerated. No significant soft tissue abnormalities are seen.  IMPRESSION: 1. No acute intracranial pathology seen on CT. 2. Mild cortical volume loss and scattered small vessel ischemic microangiopathy. 3. Mucus retention cyst or polyp at the left maxillary sinus; opacification of the right side of the sphenoid sinus.   Electronically Signed   By: Garald Balding M.D.   On: 06/07/2013 01:26    Review of Systems  Constitutional: Positive for fever and malaise/fatigue.  HENT: Negative.   Eyes: Negative.   Respiratory: Positive for cough.   Cardiovascular: Negative.   Gastrointestinal: Positive for abdominal pain. Negative for diarrhea, constipation and blood in stool.  Genitourinary: Negative.   Musculoskeletal: Negative.   Skin:        Chronic issues bilateral lower extremity  Neurological: Negative.   Endo/Heme/Allergies: Bruises/bleeds easily.  Psychiatric/Behavioral: Negative.    Blood pressure 116/60, pulse 83, temperature 99.7 F (37.6 C), temperature source Oral, resp. rate 14, SpO2 96.00%. Physical Exam  Constitutional: She is oriented to person, place, and time. She appears well-developed and well-nourished. No distress.  HENT:  Head: Normocephalic and atraumatic.  Nose: Nose normal.  Mouth/Throat: Oropharynx is clear and moist. No oropharyngeal exudate.  Eyes: EOM are normal. Pupils are equal, round, and reactive to light. Right eye exhibits no discharge. Left eye exhibits no discharge. No scleral icterus.  Neck: Normal range of motion. Neck supple. No tracheal deviation present.  Cardiovascular: Normal rate, regular rhythm and normal heart sounds.   Mild edema bilateral lower extremities, chronic venous stasis changes bilateral lower extremities with left being significantly worse than right  Respiratory: Effort normal and breath sounds normal. No stridor. No respiratory distress. She has no wheezes. She has no rales.  GI: Soft. She exhibits no distension and no mass. There is tenderness. There is no rebound and no guarding.  Small umbilical hernia, tenderness in the suprapubic area with no guarding, no generalized tenderness, bowel sounds are present  Musculoskeletal:  Chronic skin changes with scabbing left calf as described above  Lymphadenopathy:    She has no cervical adenopathy.  Neurological: She is alert and oriented to person, place, and time.  Skin: Skin is warm.  Psychiatric: She has a normal mood and affect.    Assessment/Plan: Sigmoid diverticulitis with abscess. Also possible pneumonia. Agree with medical admission and bowel rest. IV antibiotics. We'll have interventional radiology evaluate for Image guided percutaneous drainage. If this is not possible  or she does not improve with bowel  rest and IV antibiotics, she may need sigmoid colon resection with colostomy. I discussed this with her in detail. We will follow closely.  THOMPSON,BURKE E 06/07/2013, 2:35 AM

## 2013-06-07 NOTE — Progress Notes (Addendum)
ANTICOAGULATION CONSULT NOTE - Initial Consult  Pharmacy Consult for Coumadin Indication: atrial fibrillation  Allergies  Allergen Reactions  . Sulfonamide Derivatives Other (See Comments)    Childhood reaction    Patient Measurements: Height: 5\' 3"  (160 cm) Weight: 183 lb 10.3 oz (83.3 kg) IBW/kg (Calculated) : 52.4  Vital Signs: Temp: 98.3 F (36.8 C) (02/27 0322) Temp src: Oral (02/27 0322) BP: 134/81 mmHg (02/27 0322) Pulse Rate: 87 (02/27 0322)  Labs:  Recent Labs  06/06/13 2244 06/06/13 2333  HGB 11.4* 12.9  HCT 36.6 38.0  PLT 231  --   LABPROT 15.4*  --   INR 1.25  --   CREATININE 2.02* 2.10*    Estimated Creatinine Clearance: 21.9 ml/min (by C-G formula based on Cr of 2.1).   Medical History: Past Medical History  Diagnosis Date  . Diabetes mellitus   . Obesity   . COPD (chronic obstructive pulmonary disease)     Moderate. PFTs (12/10): FVC 67%, FEV1 58%, ratio 60%, TLC 95%, RV 138%, DLCO 43% (moderate obstructive defect). She is on home oxygen that should be worn at all times.  . Diastolic CHF, chronic     Echo (3/12): EF 55-60%, mild LV hypertrophy, grade I diastolic dysfunction, mild left atrial enlargement, normal pulmonary artery pressure  . CKD (chronic kidney disease)     Cr 2.4 when last checked. 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 PNA (pneumonia)     h/o with ARDS in 2005; MRSA PNA with prolonged hospitalization in [facility] 11/11  . Diverticulum     Jejunal  . OSA (obstructive sleep apnea)     possible. When pt was sedated for TEE in May 2010, she did develop some upper airway obstruction  . Anemia     of chronic disease  . Hypothyroidism   . Gout   . Atrial fibrillation 07/2008    Failed cardioversion with TEE guidance in May 2010. Pt went back to NSR by 11/2008 with amiodarne  . Hypertension   . GERD (gastroesophageal reflux disease)   . Peripheral vascular disease   . Pneumonia   . Chronic  kidney disease   . Respiratory failure, chronic 07/01/2011  . Chronic gout due to renal impairment 07/01/2011    Medications:  Prescriptions prior to admission  Medication Sig Dispense Refill  . albuterol (PROVENTIL) (2.5 MG/3ML) 0.083% nebulizer solution Take 3 mLs (2.5 mg total) by nebulization 2 (two) times daily. Dx: 492.8 COPD with Emphysema  270 mL  5  . bisoprolol (ZEBETA) 10 MG tablet Take 5 mg by mouth daily.      Marland Kitchen guaifenesin (HUMIBID E) 400 MG TABS Take 400 mg by mouth 2 (two) times daily.       Marland Kitchen levothyroxine (SYNTHROID, LEVOTHROID) 50 MCG tablet Take 50 mcg by mouth daily.      . Multiple Vitamin (MULITIVITAMIN WITH MINERALS) TABS Take 1 tablet by mouth daily.      . potassium chloride SA (K-DUR,KLOR-CON) 20 MEQ tablet Take 20 mEq by mouth daily.      . predniSONE (DELTASONE) 10 MG tablet Take 10 mg by mouth daily with breakfast.      . ranitidine (ZANTAC) 150 MG capsule Take 150 mg by mouth every evening.        . tiotropium (SPIRIVA HANDIHALER) 18 MCG inhalation capsule Place 1 capsule (18 mcg total) into inhaler and inhale daily.  30 capsule  11  . torsemide (DEMADEX) 20 MG tablet Take 80 mg  by mouth daily.      . traMADol (ULTRAM) 50 MG tablet Take 0.5 tablets (25 mg total) by mouth every 12 (twelve) hours as needed for moderate pain.  10 tablet  0  . warfarin (COUMADIN) 2 MG tablet Take 2 mg by mouth daily at 6 PM.       Scheduled:  . albuterol  2.5 mg Nebulization BID  . bisoprolol  5 mg Oral Daily  . ceFEPime (MAXIPIME) IV  1 g Intravenous Q24H  . heparin  5,000 Units Subcutaneous 3 times per day  . levothyroxine  50 mcg Oral QAC breakfast  . predniSONE  10 mg Oral Q breakfast  . tiotropium  18 mcg Inhalation Daily  . vancomycin  1,250 mg Intravenous Q24H    Assessment: 80yo female admitted for diverticulitis and possible PNA to continue Coumadin for Afib; last dose PTA was 2/25 with subtherapeutic INR currently.  Goal of Therapy:  INR 2-3   Plan:  Will give  Coumadin 3mg  po x1 today and monitor INR for dose adjustments.  Vernard GamblesVeronda Bryk, PharmD, BCPS  06/07/2013,4:24 AM  Addum:  Per Dr Gwenlyn PerkingMadera will hold coumadin for now as she may need surgery.

## 2013-06-07 NOTE — ED Notes (Signed)
Patient transported to CT 

## 2013-06-07 NOTE — Progress Notes (Signed)
Subjective: Patient feeling a little bit better since admission. Awaiting Interventional Radiology evaluation  Objective: Vital signs in last 24 hours: Temp:  [98.3 F (36.8 C)-103.2 F (39.6 C)] 99.1 F (37.3 C) (02/27 0539) Pulse Rate:  [83-125] 87 (02/27 0322) Resp:  [14-27] 18 (02/27 0322) BP: (104-156)/(54-90) 134/81 mmHg (02/27 0322) SpO2:  [92 %-99 %] 98 % (02/27 0322) Weight:  [183 lb 10.3 oz (83.3 kg)] 183 lb 10.3 oz (83.3 kg) (02/27 0322) Last BM Date: 06/06/13  Intake/Output from previous day: 02/26 0701 - 02/27 0700 In: 2300 [I.V.:2000; IV Piggyback:300] Out: 300 [Urine:300] Intake/Output this shift:    General appearance: alert, cooperative and no distress GI: soft umbilical hernia; suprapubic tenderness - mild  Lab Results:   Recent Labs  06/06/13 2244 06/06/13 2333 06/07/13 0426  WBC 11.2*  --  8.6  HGB 11.4* 12.9 10.9*  HCT 36.6 38.0 34.9*  PLT 231  --  202   BMET  Recent Labs  06/06/13 2244 06/06/13 2333 06/07/13 0426  NA 132* 135* 137  K 4.6 4.6 4.3  CL 89* 95* 97  CO2 25  --  25  GLUCOSE 194* 198* 160*  BUN 62* 77* 57*  CREATININE 2.02* 2.10* 1.78*  CALCIUM 8.6  --  8.3*   PT/INR  Recent Labs  06/06/13 2244  LABPROT 15.4*  INR 1.25   ABG No results found for this basename: PHART, PCO2, PO2, HCO3,  in the last 72 hours  Studies/Results: Ct Abdomen Pelvis Wo Contrast  06/07/2013   CLINICAL DATA:  Altered mental status, fever and weakness. Abdominal pain, nausea and vomiting.  EXAM: CT ABDOMEN AND PELVIS WITHOUT CONTRAST  TECHNIQUE: Multidetector CT imaging of the abdomen and pelvis was performed following the standard protocol without intravenous contrast.  COMPARISON:  CT of the abdomen and pelvis performed 03/20/2013, and abdominal ultrasound performed 03/21/2013  FINDINGS: Mild left basilar atelectasis is noted.  The mildly nodular contour of the liver may reflect mild cirrhotic change. A few small stones are seen layering  dependently within the gallbladder; the gallbladder is otherwise unremarkable in appearance. The pancreas and adrenal glands are unremarkable.  Mild bilateral renal atrophy is noted. A 1.6 cm cyst is noted arising along the posterior aspect of the right kidney. There is no evidence of hydronephrosis. No renal or ureteral stones are seen. No significant perinephric stranding is appreciated.  No free fluid is identified. The small bowel is unremarkable in appearance. The stomach is within normal limits. No acute vascular abnormalities are seen. There is diffuse tortuosity of the abdominal aorta, with slight ectasia of the distal abdominal aorta. Diffuse calcification is seen along the abdominal aorta and its branches.  The appendix is normal in caliber, without evidence for appendicitis. Contrast progresses to the level of the mid transverse colon. Diffuse diverticulosis is noted along the sigmoid colon.  There is a enlarged 4.6 x 3.6 x 2.9 cm collection of fluid and air tracking adjacent to the uterus and left side of the bladder, containing minimal high density debris. There is also an enlarging collection of fluid and air noted along the upper left pelvic sidewall, measuring 6.3 x 2.5 x 4.4 cm. These are directly adjacent to the left ovary.  On comparison with multiple prior CTs, the patient did not have a large colonic diverticulum in this region. Stool within the pelvic sidewall collection on the prior study likely reflected a fistulous connection to a complex abscess in the pelvis. An inflamed diverticulum would not demonstrate  this degree of enlargement over time. Surrounding soft tissue inflammation is seen. Communication between these abscesses is not well assessed.  The bladder is moderately distended and grossly unremarkable in appearance, though displaced to the right by the patient's abscess. The uterus is difficult to fully assess, as it extends directly adjacent to the abscess, but appears grossly  unremarkable, aside from a single tiny focus of air at the cervix, likely within normal limits. No inguinal lymphadenopathy is seen.  No acute osseous abnormalities are identified. 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  IMPRESSION: 1. Enlarging mildly complex abscesses noted within the pelvis. 4.6 x 3.6 x 2.9 cm collection of fluid and air tracking adjacent to the uterus and left side of the bladder has increased in size from the prior study, and now contains minimal high density debris. An additional enlarging collection of fluid and air along the upper left pelvic sidewall measures 6.3 x 2.5 x 4.4 cm. Stool was noted in the pelvic sidewall collection on the prior study, likely reflecting a fistulous connection to the abscess. No large colonic diverticula were present in this region on prior CTs to suggest a thick-walled diverticulum, and a colonic diverticulum would not demonstrate this degree of enlargement and irregularity over time. Surrounding soft tissue inflammation noted. 2. Diffuse diverticulosis along the sigmoid colon. 3. Cholelithiasis; gallbladder otherwise unremarkable in appearance. 4. Mild bilateral renal atrophy seen.  Small right renal cyst noted. 5. Diffuse calcification along the abdominal aorta and its branches. 6. Mildly nodular contour of the liver may reflect mild cirrhotic change. 7. Mild left basilar atelectasis noted.  These results were called by telephone at the time of interpretation on 06/07/2013 at 1:51 AM to [facility] PA, who verbally acknowledged these results.   Electronically Signed   By: Roanna Raider M.D.   On: 06/07/2013 01:54   Dg Chest 2 View  06/06/2013   CLINICAL DATA:  Weakness cough  EXAM: CHEST  2 VIEW  COMPARISON:  03/21/2013 prior chest radiographs  FINDINGS: Cardiomegaly noted.  Left lower lobe opacity is suspicious for pneumonia.  There is no evidence of pulmonary edema, suspicious pulmonary nodule/mass, pleural effusion, or  pneumothorax. No acute bony abnormalities are identified.  IMPRESSION: Probable left lower lobe pneumonia -radiographic followup to resolution recommended.  Cardiomegaly.   Electronically Signed   By: Laveda Abbe M.D.   On: 06/06/2013 21:22   Ct Head Wo Contrast  06/07/2013   CLINICAL DATA:  Altered mental status, weakness and fever. Abdominal pain, nausea and vomiting.  EXAM: CT HEAD WITHOUT CONTRAST  TECHNIQUE: Contiguous axial images were obtained from the base of the skull through the vertex without intravenous contrast.  COMPARISON:  None.  FINDINGS: There is no evidence of acute infarction, mass lesion, or intra- or extra-axial hemorrhage on CT.  Prominence of the ventricles and sulci reflects mild cortical volume loss. Scattered periventricular and subcortical white matter change likely reflects small vessel ischemic microangiopathy. Mild cerebellar atrophy is noted.  The brainstem and fourth ventricle are within normal limits. The basal ganglia are unremarkable in appearance. The cerebral hemispheres demonstrate grossly normal gray-white differentiation. No mass effect or midline shift is seen.  There is no evidence of fracture; visualized osseous structures are unremarkable in appearance. The orbits are within normal limits. A mucus retention cyst or polyp is noted at the left maxillary sinus. There is opacification of the right side of the sphenoid sinus. The remaining paranasal sinuses and mastoid air cells are  well-aerated. No significant soft tissue abnormalities are seen.  IMPRESSION: 1. No acute intracranial pathology seen on CT. 2. Mild cortical volume loss and scattered small vessel ischemic microangiopathy. 3. Mucus retention cyst or polyp at the left maxillary sinus; opacification of the right side of the sphenoid sinus.   Electronically Signed   By: Roanna RaiderJeffery  Chang M.D.   On: 06/07/2013 01:26    Anti-infectives: Anti-infectives   Start     Dose/Rate Route Frequency Ordered Stop   06/07/13  1800  cefTRIAXone (ROCEPHIN) 1 g in dextrose 5 % 50 mL IVPB  Status:  Discontinued     1 g 100 mL/hr over 30 Minutes Intravenous Every 24 hours 06/06/13 2134 06/07/13 0341   06/07/13 1600  azithromycin (ZITHROMAX) tablet 500 mg  Status:  Discontinued     500 mg Oral Every 24 hours 06/06/13 2134 06/07/13 0341   06/07/13 0600  ceFEPIme (MAXIPIME) 1 g in dextrose 5 % 50 mL IVPB  Status:  Discontinued     1 g 100 mL/hr over 30 Minutes Intravenous 3 times per day 06/07/13 0341 06/07/13 0353   06/07/13 0500  ceFEPIme (MAXIPIME) 1 g in dextrose 5 % 50 mL IVPB     1 g 100 mL/hr over 30 Minutes Intravenous Every 24 hours 06/07/13 0354 06/15/13 0459   06/07/13 0400  vancomycin (VANCOCIN) 1,250 mg in sodium chloride 0.9 % 250 mL IVPB     1,250 mg 166.7 mL/hr over 90 Minutes Intravenous Every 24 hours 06/07/13 0353     06/07/13 0215  metroNIDAZOLE (FLAGYL) IVPB 500 mg     500 mg 100 mL/hr over 60 Minutes Intravenous  Once 06/07/13 0205 06/07/13 0302   06/06/13 2130  cefTRIAXone (ROCEPHIN) 1 g in dextrose 5 % 50 mL IVPB     1 g 100 mL/hr over 30 Minutes Intravenous  Once 06/06/13 2128 06/07/13 0004   06/06/13 2130  azithromycin (ZITHROMAX) 500 mg in dextrose 5 % 250 mL IVPB     500 mg 250 mL/hr over 60 Minutes Intravenous  Once 06/06/13 2128 06/07/13 0122      Assessment/Plan: s/p * No surgery found * Sigmoid diverticulitis with abscess - not responsive to outpatient treatment  She seems to be improving with IV antibiotics.  WBC normal.  Interventional radiology to evaluate for placement of percutaneous abscess drain.  If this is unsuccessful or if her clinical status worsens, she may need urgent sigmoid colectomy with possible colostomy.    LOS: 1 day    Merlina Marchena K. 06/07/2013

## 2013-06-07 NOTE — H&P (Signed)
[facility] History and Physical  Patient: [name]  ZOX:[id number]  DOB: June 30, 1930  DOS: the patient was seen and examined on 06/07/2013 PCP: Shan Levans, MD  Chief Complaint: Altered mental status  HPI: [name] is a 80 y.o. female with Past medical history of diabetes mellitus, obesity, COPD, chronic diastolic dysfunction, chronic kidney disease, obstructive sleep apnea, on home oxygen, hypothyroidism. The patient is coming from home. The patient presented with lethargy and altered mental status that the family has noted today there was no fall no trauma no injury no recent change in her medications. Patient has cough since last one week without any expectoration. Denies any shortness of breath chest pain palpitation nausea vomiting diarrhea or constipation or burning urination or active bleeding or focal neurological deficit. She has chronic abdominal pain which is present since last few months and progressively gradually worsened. She is compliant with her medication  Review of Systems: as mentioned in the history of present illness.  A Comprehensive review of the other systems is negative.  Past Medical History  Diagnosis Date  . Diabetes mellitus   . Obesity   . COPD (chronic obstructive pulmonary disease)     Moderate. PFTs (12/10): FVC 67%, FEV1 58%, ratio 60%, TLC 95%, RV 138%, DLCO 43% (moderate obstructive defect). She is on home oxygen that should be worn at all times.  . Diastolic CHF, chronic     Echo (3/12): EF 55-60%, mild LV hypertrophy, grade I diastolic dysfunction, mild left atrial enlargement, normal pulmonary artery pressure  . CKD (chronic kidney disease)     Cr 2.4 when last checked. 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 PNA (pneumonia)     h/o with ARDS in 2005; MRSA PNA with prolonged hospitalization in [facility] 11/11  . Diverticulum     Jejunal  . OSA (obstructive sleep apnea)     possible.  When pt was sedated for TEE in May 2010, she did develop some upper airway obstruction  . Anemia     of chronic disease  . Hypothyroidism   . Gout   . Atrial fibrillation 07/2008    Failed cardioversion with TEE guidance in May 2010. Pt went back to NSR by 11/2008 with amiodarne  . Hypertension   . GERD (gastroesophageal reflux disease)   . Peripheral vascular disease   . Pneumonia   . Chronic kidney disease   . Respiratory failure, chronic 07/01/2011  . Chronic gout due to renal impairment 07/01/2011   Past Surgical History  Procedure Laterality Date  . Tracheostomy      ARDS/ ICU admission, trach 2005  . Microperforation  08/2008    Of jejunem, ICU admission   Social History:  reports that she quit smoking about 37 years ago. Her smoking use included Cigarettes. She has a 23 pack-year smoking history. She has never used smokeless tobacco. She reports that she drinks alcohol. She reports that she does not use illicit drugs. Partially Independent for most of her  ADL.  Allergies  Allergen Reactions  . Sulfonamide Derivatives Other (See Comments)    Childhood reaction    Family History  Problem Relation Age of Onset  . Emphysema Mother     and asthma  . Asthma Mother   . Heart disease Neg Hx     premature    Prior to Admission medications   Medication Sig Start Date End Date Taking? Authorizing Provider  albuterol (PROVENTIL) (2.5 MG/3ML) 0.083% nebulizer solution Take  3 mLs (2.5 mg total) by nebulization 2 (two) times daily. Dx: 492.8 COPD with Emphysema 04/15/13  Yes Storm Frisk, MD  bisoprolol (ZEBETA) 10 MG tablet Take 5 mg by mouth daily.   Yes Historical Provider, MD  guaifenesin (HUMIBID E) 400 MG TABS Take 400 mg by mouth 2 (two) times daily.    Yes Historical Provider, MD  levothyroxine (SYNTHROID, LEVOTHROID) 50 MCG tablet Take 50 mcg by mouth daily.   Yes Historical Provider, MD  Multiple Vitamin (MULITIVITAMIN WITH MINERALS) TABS Take 1 tablet by mouth daily.  05/31/11  Yes Daniel J Angiulli, PA-C  potassium chloride SA (K-DUR,KLOR-CON) 20 MEQ tablet Take 20 mEq by mouth daily.   Yes Historical Provider, MD  predniSONE (DELTASONE) 10 MG tablet Take 10 mg by mouth daily with breakfast.   Yes Historical Provider, MD  ranitidine (ZANTAC) 150 MG capsule Take 150 mg by mouth every evening.     Yes Historical Provider, MD  tiotropium (SPIRIVA HANDIHALER) 18 MCG inhalation capsule Place 1 capsule (18 mcg total) into inhaler and inhale daily. 08/30/12 08/30/13 Yes Storm Frisk, MD  torsemide (DEMADEX) 20 MG tablet Take 80 mg by mouth daily.   Yes Historical Provider, MD  traMADol (ULTRAM) 50 MG tablet Take 0.5 tablets (25 mg total) by mouth every 12 (twelve) hours as needed for moderate pain. 03/24/13  Yes Boykin Peek, MD  warfarin (COUMADIN) 2 MG tablet Take 2 mg by mouth daily at 6 PM.   Yes Historical Provider, MD    Physical Exam: Filed Vitals:   06/07/13 0000 06/07/13 0015 06/07/13 0101 06/07/13 0322  BP: 117/55 122/61 116/60 134/81  Pulse: 103 92 83 87  Temp:   99.7 F (37.6 C) 98.3 F (36.8 C)  TempSrc:   Oral Oral  Resp: 20 22 14 18   Height:    5\' 3"  (1.6 m)  Weight:    83.3 kg (183 lb 10.3 oz)  SpO2: 96% 97% 96% 98%    General: Alert, Awake and Oriented to Time, Place and Person. Appear in mild distress Eyes: PERRL ENT: Oral Mucosa clear dry. Neck:  no  JVD Cardiovascular: S1 and S2 Present, aortic systolic  Murmur, Peripheral Pulses Present Respiratory: Bilateral Air entry equal and Decreased, no  Crackles,no  wheezes Abdomen: Bowel Sound Present, Soft and left-sided  tenderNo guarding no rigidity  Skin:  no  Rash Extremities:  trace Pedal edema,  no  calf tenderness Neurologic: Grossly Unremarkable. Labs on Admission:  CBC:  Recent Labs Lab 06/06/13 2244 06/06/13 2333  WBC 11.2*  --   NEUTROABS 9.6*  --   HGB 11.4* 12.9  HCT 36.6 38.0  MCV 91.3  --   PLT 231  --     CMP     Component Value Date/Time   NA 135*  06/06/2013 2333   K 4.6 06/06/2013 2333   CL 95* 06/06/2013 2333   CO2 25 06/06/2013 2244   GLUCOSE 198* 06/06/2013 2333   BUN 77* 06/06/2013 2333   CREATININE 2.10* 06/06/2013 2333   CREATININE 1.90* 03/23/2012 1621   CALCIUM 8.6 06/06/2013 2244   PROT 6.7 06/06/2013 2244   ALBUMIN 2.7* 06/06/2013 2244   AST 48* 06/06/2013 2244   ALT 49* 06/06/2013 2244   ALKPHOS 230* 06/06/2013 2244   BILITOT 0.3 06/06/2013 2244   GFRNONAA 22* 06/06/2013 2244   GFRAA 26* 06/06/2013 2244     Recent Labs Lab 06/06/13 2244  LIPASE 20   No results found for this  basename: AMMONIA,  in the last 168 hours  No results found for this basename: CKTOTAL, CKMB, CKMBINDEX, TROPONINI,  in the last 168 hours BNP (last 3 results)  Recent Labs  07/25/12 0822 12/05/12 1558 06/06/13 2244  PROBNP 399.0* 277.0* 1764.0*    Radiological Exams on Admission: Ct Abdomen Pelvis Wo Contrast  06/07/2013   CLINICAL DATA:  Altered mental status, fever and weakness. Abdominal pain, nausea and vomiting.  EXAM: CT ABDOMEN AND PELVIS WITHOUT CONTRAST  TECHNIQUE: Multidetector CT imaging of the abdomen and pelvis was performed following the standard protocol without intravenous contrast.  COMPARISON:  CT of the abdomen and pelvis performed 03/20/2013, and abdominal ultrasound performed 03/21/2013  FINDINGS: Mild left basilar atelectasis is noted.  The mildly nodular contour of the liver may reflect mild cirrhotic change. A few small stones are seen layering dependently within the gallbladder; the gallbladder is otherwise unremarkable in appearance. The pancreas and adrenal glands are unremarkable.  Mild bilateral renal atrophy is noted. A 1.6 cm cyst is noted arising along the posterior aspect of the right kidney. There is no evidence of hydronephrosis. No renal or ureteral stones are seen. No significant perinephric stranding is appreciated.  No free fluid is identified. The small bowel is unremarkable in appearance. The stomach is within  normal limits. No acute vascular abnormalities are seen. There is diffuse tortuosity of the abdominal aorta, with slight ectasia of the distal abdominal aorta. Diffuse calcification is seen along the abdominal aorta and its branches.  The appendix is normal in caliber, without evidence for appendicitis. Contrast progresses to the level of the mid transverse colon. Diffuse diverticulosis is noted along the sigmoid colon.  There is a enlarged 4.6 x 3.6 x 2.9 cm collection of fluid and air tracking adjacent to the uterus and left side of the bladder, containing minimal high density debris. There is also an enlarging collection of fluid and air noted along the upper left pelvic sidewall, measuring 6.3 x 2.5 x 4.4 cm. These are directly adjacent to the left ovary.  On comparison with multiple prior CTs, the patient did not have a large colonic diverticulum in this region. Stool within the pelvic sidewall collection on the prior study likely reflected a fistulous connection to a complex abscess in the pelvis. An inflamed diverticulum would not demonstrate this degree of enlargement over time. Surrounding soft tissue inflammation is seen. Communication between these abscesses is not well assessed.  The bladder is moderately distended and grossly unremarkable in appearance, though displaced to the right by the patient's abscess. The uterus is difficult to fully assess, as it extends directly adjacent to the abscess, but appears grossly unremarkable, aside from a single tiny focus of air at the cervix, likely within normal limits. No inguinal lymphadenopathy is seen.  No acute osseous abnormalities are identified. 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  IMPRESSION: 1. Enlarging mildly complex abscesses noted within the pelvis. 4.6 x 3.6 x 2.9 cm collection of fluid and air tracking adjacent to the uterus and left side of the bladder has increased in size from the prior study, and now contains minimal  high density debris. An additional enlarging collection of fluid and air along the upper left pelvic sidewall measures 6.3 x 2.5 x 4.4 cm. Stool was noted in the pelvic sidewall collection on the prior study, likely reflecting a fistulous connection to the abscess. No large colonic diverticula were present in this region on prior CTs to suggest a thick-walled  diverticulum, and a colonic diverticulum would not demonstrate this degree of enlargement and irregularity over time. Surrounding soft tissue inflammation noted. 2. Diffuse diverticulosis along the sigmoid colon. 3. Cholelithiasis; gallbladder otherwise unremarkable in appearance. 4. Mild bilateral renal atrophy seen.  Small right renal cyst noted. 5. Diffuse calcification along the abdominal aorta and its branches. 6. Mildly nodular contour of the liver may reflect mild cirrhotic change. 7. Mild left basilar atelectasis noted.  These results were called by telephone at the time of interpretation on 06/07/2013 at 1:51 AM to [facility] PA, who verbally acknowledged these results.   Electronically Signed   By: Roanna Raider M.D.   On: 06/07/2013 01:54   Dg Chest 2 View  06/06/2013   CLINICAL DATA:  Weakness cough  EXAM: CHEST  2 VIEW  COMPARISON:  03/21/2013 prior chest radiographs  FINDINGS: Cardiomegaly noted.  Left lower lobe opacity is suspicious for pneumonia.  There is no evidence of pulmonary edema, suspicious pulmonary nodule/mass, pleural effusion, or pneumothorax. No acute bony abnormalities are identified.  IMPRESSION: Probable left lower lobe pneumonia -radiographic followup to resolution recommended.  Cardiomegaly.   Electronically Signed   By: Laveda Abbe M.D.   On: 06/06/2013 21:22   Ct Head Wo Contrast  06/07/2013   CLINICAL DATA:  Altered mental status, weakness and fever. Abdominal pain, nausea and vomiting.  EXAM: CT HEAD WITHOUT CONTRAST  TECHNIQUE: Contiguous axial images were obtained from the base of the skull through the vertex  without intravenous contrast.  COMPARISON:  None.  FINDINGS: There is no evidence of acute infarction, mass lesion, or intra- or extra-axial hemorrhage on CT.  Prominence of the ventricles and sulci reflects mild cortical volume loss. Scattered periventricular and subcortical white matter change likely reflects small vessel ischemic microangiopathy. Mild cerebellar atrophy is noted.  The brainstem and fourth ventricle are within normal limits. The basal ganglia are unremarkable in appearance. The cerebral hemispheres demonstrate grossly normal gray-white differentiation. No mass effect or midline shift is seen.  There is no evidence of fracture; visualized osseous structures are unremarkable in appearance. The orbits are within normal limits. A mucus retention cyst or polyp is noted at the left maxillary sinus. There is opacification of the right side of the sphenoid sinus. The remaining paranasal sinuses and mastoid air cells are well-aerated. No significant soft tissue abnormalities are seen.  IMPRESSION: 1. No acute intracranial pathology seen on CT. 2. Mild cortical volume loss and scattered small vessel ischemic microangiopathy. 3. Mucus retention cyst or polyp at the left maxillary sinus; opacification of the right side of the sphenoid sinus.   Electronically Signed   By: Roanna Raider M.D.   On: 06/07/2013 01:26    Assessment/Plan Principal Problem:   Colonic diverticular abscess Active Problems:   HYPOTHYROIDISM   Atrial fibrillation   DIASTOLIC HEART FAILURE, CHRONIC   COPD with emphysema   Osteoarthritis   Oxygen dependent   Chronic gout due to renal impairment   Chronic kidney disease (CKD), stage IV    1. Colonic diverticular abscess the patient is presenting with altered mental status. She has leukocytosis with fever.she has undergone a CT scan of her abdomen which shows enlarged diverticular abscess. General surgery has evaluated the patient and will follow the patient. At present I  will keep the patient n.p.o. I would hold her diuretic. I would treat her with IV cefepime. Blood cultures are done will follow up   2. Left-sided pneumonia Patient had some cough since last one  week although she denies any sputum production She uses oxygen at her baseline and is not more hypoxic X-ray chest shows left-sided pneumonia Blood cultures are done urine antigens and sputum sputum culture will be done I will add vancomycin and cefepime since she has recently been admitted and within 90 day period for healthcare associated pneumonia  3. Atrial fibrillation At present rate controlled Continue Coumadin INR is subtherapeutic  4. COPD Does not appear in any acute extubation at present I would continue her Spiriva and albuterol inhalation oxygen as needed and prednisone  5. Chronic kidney disease Stable avoid nephrotoxic medications renal dozing off medications  Consults: General surgery  DVT Prophylaxis: subcutaneous Heparin until her INR is therapeutic Nutrition: N.p.o.  Code Status: Full  Family Communication: Discussed with family on phone, opportunity was given to ask question and all questions were answered satisfactorily at the time of interview. Disposition: Admitted to inpatient in telemetry unit.  Author: Lynden Oxford, MD [facility] Pager: [phone] 06/07/2013, 4:27 AM    If 7PM-7AM, please contact night-coverage [email or web address] Password [email or web address]

## 2013-06-07 NOTE — Sedation Documentation (Signed)
Transport team here.  Back to room.

## 2013-06-07 NOTE — Progress Notes (Signed)
UR completed Twylah Bennetts K. Jamisha Hoeschen, RN, BSN, MSHL, CCM  06/07/2013 10:54 AM

## 2013-06-07 NOTE — H&P (Signed)
[name] is an 80 y.o. female.   Chief Complaint: pt with continued abd pain- known since previous hospitalization 03/2013 CT then revealed abd collection with questionable connection to sigmoid colon.  Conservative management and pt did well and was dc'd home. Pain recurred and worsened- came to ER 2/26 and evaluation included CT Abd CT reveals enlarging pelvic collection with debris. Now scheduled for abscess drain  Placement  HPI: COPD; FLU; DM; CHF; CKD; PNA; OSA; A fib- (Only on Hep inj now); GERD; PVD  Past Medical History  Diagnosis Date  . Diabetes mellitus   . Obesity   . COPD (chronic obstructive pulmonary disease)     Moderate. PFTs (12/10): FVC 67%, FEV1 58%, ratio 60%, TLC 95%, RV 138%, DLCO 43% (moderate obstructive defect). She is on home oxygen that should be worn at all times.  . Diastolic CHF, chronic     Echo (3/12): EF 55-60%, mild LV hypertrophy, grade I diastolic dysfunction, mild left atrial enlargement, normal pulmonary artery pressure  . CKD (chronic kidney disease)     Cr 2.4 when last checked. ANCA +, pauci-immune glomerulnephritis followed by Dr. Marval Regal  . PNA (pneumonia)     h/o with ARDS in 2005; MRSA PNA with prolonged hospitalization in [facility] regional 11/11  . Diverticulum     Jejunal  . OSA (obstructive sleep apnea)     possible. When pt was sedated for TEE in May 2010, she did develop some upper airway obstruction  . Anemia     of chronic disease  . Hypothyroidism   . Gout   . Atrial fibrillation 07/2008    Failed cardioversion with TEE guidance in May 2010. Pt went back to NSR by 11/2008 with amiodarne  . Hypertension   . GERD (gastroesophageal reflux disease)   . Peripheral vascular disease   . Pneumonia   . Chronic kidney disease   . Respiratory failure, chronic 07/01/2011  . Chronic gout due to renal impairment 07/01/2011    Past Surgical History  Procedure Laterality Date  . Tracheostomy      ARDS/ ICU admission, trach  2005  . Microperforation  08/2008    Of jejunem, ICU admission    Family History  Problem Relation Age of Onset  . Emphysema Mother     and asthma  . Asthma Mother   . Heart disease Neg Hx     premature   Social History:  reports that she quit smoking about 37 years ago. Her smoking use included Cigarettes. She has a 23 pack-year smoking history. She has never used smokeless tobacco. She reports that she drinks alcohol. She reports that she does not use illicit drugs.  Allergies:  Allergies  Allergen Reactions  . Sulfonamide Derivatives Other (See Comments)    Childhood reaction    Medications Prior to Admission  Medication Sig Dispense Refill  . albuterol (PROVENTIL) (2.5 MG/3ML) 0.083% nebulizer solution Take 3 mLs (2.5 mg total) by nebulization 2 (two) times daily. Dx: 492.8 COPD with Emphysema  270 mL  5  . bisoprolol (ZEBETA) 10 MG tablet Take 5 mg by mouth daily.      Marland Kitchen guaifenesin (HUMIBID E) 400 MG TABS Take 400 mg by mouth 2 (two) times daily.       Marland Kitchen levothyroxine (SYNTHROID, LEVOTHROID) 50 MCG tablet Take 50 mcg by mouth daily.      . Multiple Vitamin (MULITIVITAMIN WITH MINERALS) TABS Take 1 tablet by mouth daily.      . potassium chloride  SA (K-DUR,KLOR-CON) 20 MEQ tablet Take 20 mEq by mouth daily.      . predniSONE (DELTASONE) 10 MG tablet Take 10 mg by mouth daily with breakfast.      . ranitidine (ZANTAC) 150 MG capsule Take 150 mg by mouth every evening.        . tiotropium (SPIRIVA HANDIHALER) 18 MCG inhalation capsule Place 1 capsule (18 mcg total) into inhaler and inhale daily.  30 capsule  11  . torsemide (DEMADEX) 20 MG tablet Take 80 mg by mouth daily.      . traMADol (ULTRAM) 50 MG tablet Take 0.5 tablets (25 mg total) by mouth every 12 (twelve) hours as needed for moderate pain.  10 tablet  0  . warfarin (COUMADIN) 2 MG tablet Take 2 mg by mouth daily at 6 PM.        Results for orders placed during the hospital encounter of 06/06/13 (from the past 48  hour(s))  URINALYSIS, ROUTINE W REFLEX MICROSCOPIC     Status: Abnormal   Collection Time    06/06/13  8:25 PM      Result Value Ref Range   Color, Urine YELLOW  YELLOW   APPearance CLEAR  CLEAR   Specific Gravity, Urine 1.012  1.005 - 1.030   pH 5.0  5.0 - 8.0   Glucose, UA NEGATIVE  NEGATIVE mg/dL   Hgb urine dipstick MODERATE (*) NEGATIVE   Bilirubin Urine NEGATIVE  NEGATIVE   Ketones, ur NEGATIVE  NEGATIVE mg/dL   Protein, ur NEGATIVE  NEGATIVE mg/dL   Urobilinogen, UA 0.2  0.0 - 1.0 mg/dL   Nitrite NEGATIVE  NEGATIVE   Leukocytes, UA TRACE (*) NEGATIVE  URINE MICROSCOPIC-ADD ON     Status: Abnormal   Collection Time    06/06/13  8:25 PM      Result Value Ref Range   Squamous Epithelial / LPF RARE  RARE   WBC, UA 0-2  <3 WBC/hpf   RBC / HPF 7-10  <3 RBC/hpf   Bacteria, UA RARE  RARE   Casts HYALINE CASTS (*) NEGATIVE   Urine-Other RARE YEAST    CBG MONITORING, ED     Status: Abnormal   Collection Time    06/06/13  8:32 PM      Result Value Ref Range   Glucose-Capillary 152 (*) 70 - 99 mg/dL  CBC WITH DIFFERENTIAL     Status: Abnormal   Collection Time    06/06/13 10:44 PM      Result Value Ref Range   WBC 11.2 (*) 4.0 - 10.5 K/uL   RBC 4.01  3.87 - 5.11 MIL/uL   Hemoglobin 11.4 (*) 12.0 - 15.0 g/dL   HCT 36.6  36.0 - 46.0 %   MCV 91.3  78.0 - 100.0 fL   MCH 28.4  26.0 - 34.0 pg   MCHC 31.1  30.0 - 36.0 g/dL   RDW 15.0  11.5 - 15.5 %   Platelets 231  150 - 400 K/uL   Neutrophils Relative % 86 (*) 43 - 77 %   Neutro Abs 9.6 (*) 1.7 - 7.7 K/uL   Lymphocytes Relative 10 (*) 12 - 46 %   Lymphs Abs 1.1  0.7 - 4.0 K/uL   Monocytes Relative 4  3 - 12 %   Monocytes Absolute 0.5  0.1 - 1.0 K/uL   Eosinophils Relative 0  0 - 5 %   Eosinophils Absolute 0.0  0.0 - 0.7 K/uL   Basophils Relative  0  0 - 1 %   Basophils Absolute 0.0  0.0 - 0.1 K/uL  COMPREHENSIVE METABOLIC PANEL     Status: Abnormal   Collection Time    06/06/13 10:44 PM      Result Value Ref Range    Sodium 132 (*) 137 - 147 mEq/L   Potassium 4.6  3.7 - 5.3 mEq/L   Chloride 89 (*) 96 - 112 mEq/L   CO2 25  19 - 32 mEq/L   Glucose, Bld 194 (*) 70 - 99 mg/dL   BUN 62 (*) 6 - 23 mg/dL   Creatinine, Ser 2.02 (*) 0.50 - 1.10 mg/dL   Calcium 8.6  8.4 - 10.5 mg/dL   Total Protein 6.7  6.0 - 8.3 g/dL   Albumin 2.7 (*) 3.5 - 5.2 g/dL   AST 48 (*) 0 - 37 U/L   ALT 49 (*) 0 - 35 U/L   Alkaline Phosphatase 230 (*) 39 - 117 U/L   Total Bilirubin 0.3  0.3 - 1.2 mg/dL   GFR calc non Af Amer 22 (*) >90 mL/min   GFR calc Af Amer 26 (*) >90 mL/min   Comment: (NOTE)     The eGFR has been calculated using the CKD EPI equation.     This calculation has not been validated in all clinical situations.     eGFR's persistently <90 mL/min signify possible Chronic Kidney     Disease.  PROTIME-INR     Status: Abnormal   Collection Time    06/06/13 10:44 PM      Result Value Ref Range   Prothrombin Time 15.4 (*) 11.6 - 15.2 seconds   INR 1.25  0.00 - 1.49  PROCALCITONIN     Status: None   Collection Time    06/06/13 10:44 PM      Result Value Ref Range   Procalcitonin 0.40     Comment:            Interpretation:     PCT (Procalcitonin) <= 0.5 ng/mL:     Systemic infection (sepsis) is not likely.     Local bacterial infection is possible.     (NOTE)             ICU PCT Algorithm               Non ICU PCT Algorithm        ----------------------------     ------------------------------             PCT < 0.25 ng/mL                 PCT < 0.1 ng/mL         Stopping of antibiotics            Stopping of antibiotics           strongly encouraged.               strongly encouraged.        ----------------------------     ------------------------------           PCT level decrease by               PCT < 0.25 ng/mL           >= 80% from peak PCT           OR PCT 0.25 - 0.5 ng/mL          Stopping of antibiotics  encouraged.         Stopping of antibiotics                encouraged.        ----------------------------     ------------------------------           PCT level decrease by              PCT >= 0.25 ng/mL           < 80% from peak PCT            AND PCT >= 0.5 ng/mL            Continuing antibiotics                                                  encouraged.           Continuing antibiotics                encouraged.        ----------------------------     ------------------------------         PCT level increase compared          PCT > 0.5 ng/mL             with peak PCT AND              PCT >= 0.5 ng/mL             Escalation of antibiotics                                              strongly encouraged.          Escalation of antibiotics            strongly encouraged.  PRO B NATRIURETIC PEPTIDE     Status: Abnormal   Collection Time    06/06/13 10:44 PM      Result Value Ref Range   Pro B Natriuretic peptide (BNP) 1764.0 (*) 0 - 450 pg/mL  LIPASE, BLOOD     Status: None   Collection Time    06/06/13 10:44 PM      Result Value Ref Range   Lipase 20  11 - 59 U/L  I-STAT CHEM 8, ED     Status: Abnormal   Collection Time    06/06/13 11:33 PM      Result Value Ref Range   Sodium 135 (*) 137 - 147 mEq/L   Potassium 4.6  3.7 - 5.3 mEq/L   Chloride 95 (*) 96 - 112 mEq/L   BUN 77 (*) 6 - 23 mg/dL   Creatinine, Ser 2.10 (*) 0.50 - 1.10 mg/dL   Glucose, Bld 198 (*) 70 - 99 mg/dL   Calcium, Ion 1.10 (*) 1.13 - 1.30 mmol/L   TCO2 29  0 - 100 mmol/L   Hemoglobin 12.9  12.0 - 15.0 g/dL   HCT 38.0  36.0 - 46.0 %  INFLUENZA PANEL BY PCR (TYPE A & B, H1N1)     Status: Abnormal   Collection Time    06/06/13 11:48 PM      Result Value Ref Range   Influenza A By PCR POSITIVE (*) NEGATIVE  Influenza B By PCR NEGATIVE  NEGATIVE   H1N1 flu by pcr NOT DETECTED  NOT DETECTED   Comment:            The Xpert Flu assay (FDA approved for     nasal aspirates or washes and     nasopharyngeal swab specimens), is     intended as an aid in  the diagnosis of     influenza and should not be used as     a sole basis for treatment.  I-STAT CG4 LACTIC ACID, ED     Status: None   Collection Time    06/07/13  1:37 AM      Result Value Ref Range   Lactic Acid, Venous 0.68  0.5 - 2.2 mmol/L  MRSA PCR SCREENING     Status: None   Collection Time    06/07/13  3:23 AM      Result Value Ref Range   MRSA by PCR NEGATIVE  NEGATIVE   Comment:            The GeneXpert MRSA Assay (FDA     approved for NASAL specimens     only), is one component of a     comprehensive MRSA colonization     surveillance program. It is not     intended to diagnose MRSA     infection nor to guide or     monitor treatment for     MRSA infections.  STREP PNEUMONIAE URINARY ANTIGEN     Status: None   Collection Time    06/07/13  3:48 AM      Result Value Ref Range   Strep Pneumo Urinary Antigen NEGATIVE  NEGATIVE   Comment:            Infection due to S. pneumoniae     cannot be absolutely ruled out     since the antigen present     may be below the detection limit     of the test.  COMPREHENSIVE METABOLIC PANEL     Status: Abnormal   Collection Time    06/07/13  4:26 AM      Result Value Ref Range   Sodium 137  137 - 147 mEq/L   Potassium 4.3  3.7 - 5.3 mEq/L   Chloride 97  96 - 112 mEq/L   CO2 25  19 - 32 mEq/L   Glucose, Bld 160 (*) 70 - 99 mg/dL   BUN 57 (*) 6 - 23 mg/dL   Creatinine, Ser 1.78 (*) 0.50 - 1.10 mg/dL   Calcium 8.3 (*) 8.4 - 10.5 mg/dL   Total Protein 6.2  6.0 - 8.3 g/dL   Albumin 2.4 (*) 3.5 - 5.2 g/dL   AST 32  0 - 37 U/L   ALT 38 (*) 0 - 35 U/L   Alkaline Phosphatase 201 (*) 39 - 117 U/L   Total Bilirubin 0.2 (*) 0.3 - 1.2 mg/dL   GFR calc non Af Amer 26 (*) >90 mL/min   GFR calc Af Amer 30 (*) >90 mL/min   Comment: (NOTE)     The eGFR has been calculated using the CKD EPI equation.     This calculation has not been validated in all clinical situations.     eGFR's persistently <90 mL/min signify possible Chronic  Kidney     Disease.  CBC WITH DIFFERENTIAL     Status: Abnormal   Collection Time    06/07/13  4:26 AM  Result Value Ref Range   WBC 8.6  4.0 - 10.5 K/uL   RBC 3.83 (*) 3.87 - 5.11 MIL/uL   Hemoglobin 10.9 (*) 12.0 - 15.0 g/dL   HCT 34.9 (*) 36.0 - 46.0 %   MCV 91.1  78.0 - 100.0 fL   MCH 28.5  26.0 - 34.0 pg   MCHC 31.2  30.0 - 36.0 g/dL   RDW 15.2  11.5 - 15.5 %   Platelets 202  150 - 400 K/uL   Neutrophils Relative % 78 (*) 43 - 77 %   Lymphocytes Relative 17  12 - 46 %   Monocytes Relative 5  3 - 12 %   Eosinophils Relative 0  0 - 5 %   Basophils Relative 0  0 - 1 %   Neutro Abs 6.7  1.7 - 7.7 K/uL   Lymphs Abs 1.5  0.7 - 4.0 K/uL   Monocytes Absolute 0.4  0.1 - 1.0 K/uL   Eosinophils Absolute 0.0  0.0 - 0.7 K/uL   Basophils Absolute 0.0  0.0 - 0.1 K/uL   WBC Morphology INCREASED BANDS (>20% BANDS)     Ct Abdomen Pelvis Wo Contrast  06/07/2013   CLINICAL DATA:  Altered mental status, fever and weakness. Abdominal pain, nausea and vomiting.  EXAM: CT ABDOMEN AND PELVIS WITHOUT CONTRAST  TECHNIQUE: Multidetector CT imaging of the abdomen and pelvis was performed following the standard protocol without intravenous contrast.  COMPARISON:  CT of the abdomen and pelvis performed 03/20/2013, and abdominal ultrasound performed 03/21/2013  FINDINGS: Mild left basilar atelectasis is noted.  The mildly nodular contour of the liver may reflect mild cirrhotic change. A few small stones are seen layering dependently within the gallbladder; the gallbladder is otherwise unremarkable in appearance. The pancreas and adrenal glands are unremarkable.  Mild bilateral renal atrophy is noted. A 1.6 cm cyst is noted arising along the posterior aspect of the right kidney. There is no evidence of hydronephrosis. No renal or ureteral stones are seen. No significant perinephric stranding is appreciated.  No free fluid is identified. The small bowel is unremarkable in appearance. The stomach is within normal  limits. No acute vascular abnormalities are seen. There is diffuse tortuosity of the abdominal aorta, with slight ectasia of the distal abdominal aorta. Diffuse calcification is seen along the abdominal aorta and its branches.  The appendix is normal in caliber, without evidence for appendicitis. Contrast progresses to the level of the mid transverse colon. Diffuse diverticulosis is noted along the sigmoid colon.  There is a enlarged 4.6 x 3.6 x 2.9 cm collection of fluid and air tracking adjacent to the uterus and left side of the bladder, containing minimal high density debris. There is also an enlarging collection of fluid and air noted along the upper left pelvic sidewall, measuring 6.3 x 2.5 x 4.4 cm. These are directly adjacent to the left ovary.  On comparison with multiple prior CTs, the patient did not have a large colonic diverticulum in this region. Stool within the pelvic sidewall collection on the prior study likely reflected a fistulous connection to a complex abscess in the pelvis. An inflamed diverticulum would not demonstrate this degree of enlargement over time. Surrounding soft tissue inflammation is seen. Communication between these abscesses is not well assessed.  The bladder is moderately distended and grossly unremarkable in appearance, though displaced to the right by the patient's abscess. The uterus is difficult to fully assess, as it extends directly adjacent to the abscess, but  appears grossly unremarkable, aside from a single tiny focus of air at the cervix, likely within normal limits. No inguinal lymphadenopathy is seen.  No acute osseous abnormalities are identified. 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  IMPRESSION: 1. Enlarging mildly complex abscesses noted within the pelvis. 4.6 x 3.6 x 2.9 cm collection of fluid and air tracking adjacent to the uterus and left side of the bladder has increased in size from the prior study, and now contains minimal high  density debris. An additional enlarging collection of fluid and air along the upper left pelvic sidewall measures 6.3 x 2.5 x 4.4 cm. Stool was noted in the pelvic sidewall collection on the prior study, likely reflecting a fistulous connection to the abscess. No large colonic diverticula were present in this region on prior CTs to suggest a thick-walled diverticulum, and a colonic diverticulum would not demonstrate this degree of enlargement and irregularity over time. Surrounding soft tissue inflammation noted. 2. Diffuse diverticulosis along the sigmoid colon. 3. Cholelithiasis; gallbladder otherwise unremarkable in appearance. 4. Mild bilateral renal atrophy seen.  Small right renal cyst noted. 5. Diffuse calcification along the abdominal aorta and its branches. 6. Mildly nodular contour of the liver may reflect mild cirrhotic change. 7. Mild left basilar atelectasis noted.  These results were called by telephone at the time of interpretation on 06/07/2013 at 1:51 AM to [facility] PA, who verbally acknowledged these results.   Electronically Signed   By: Garald Balding M.D.   On: 06/07/2013 01:54   Dg Chest 2 View  06/06/2013   CLINICAL DATA:  Weakness cough  EXAM: CHEST  2 VIEW  COMPARISON:  03/21/2013 prior chest radiographs  FINDINGS: Cardiomegaly noted.  Left lower lobe opacity is suspicious for pneumonia.  There is no evidence of pulmonary edema, suspicious pulmonary nodule/mass, pleural effusion, or pneumothorax. No acute bony abnormalities are identified.  IMPRESSION: Probable left lower lobe pneumonia -radiographic followup to resolution recommended.  Cardiomegaly.   Electronically Signed   By: Hassan Rowan M.D.   On: 06/06/2013 21:22   Ct Head Wo Contrast  06/07/2013   CLINICAL DATA:  Altered mental status, weakness and fever. Abdominal pain, nausea and vomiting.  EXAM: CT HEAD WITHOUT CONTRAST  TECHNIQUE: Contiguous axial images were obtained from the base of the skull through the vertex without  intravenous contrast.  COMPARISON:  None.  FINDINGS: There is no evidence of acute infarction, mass lesion, or intra- or extra-axial hemorrhage on CT.  Prominence of the ventricles and sulci reflects mild cortical volume loss. Scattered periventricular and subcortical white matter change likely reflects small vessel ischemic microangiopathy. Mild cerebellar atrophy is noted.  The brainstem and fourth ventricle are within normal limits. The basal ganglia are unremarkable in appearance. The cerebral hemispheres demonstrate grossly normal gray-white differentiation. No mass effect or midline shift is seen.  There is no evidence of fracture; visualized osseous structures are unremarkable in appearance. The orbits are within normal limits. A mucus retention cyst or polyp is noted at the left maxillary sinus. There is opacification of the right side of the sphenoid sinus. The remaining paranasal sinuses and mastoid air cells are well-aerated. No significant soft tissue abnormalities are seen.  IMPRESSION: 1. No acute intracranial pathology seen on CT. 2. Mild cortical volume loss and scattered small vessel ischemic microangiopathy. 3. Mucus retention cyst or polyp at the left maxillary sinus; opacification of the right side of the sphenoid sinus.   Electronically Signed   By:  Garald Balding M.D.   On: 06/07/2013 01:26    Review of Systems  Constitutional: Positive for fever and chills.  Respiratory: Positive for cough, sputum production and shortness of breath.   Cardiovascular: Negative for chest pain.  Gastrointestinal: Positive for nausea and abdominal pain.  Musculoskeletal: Positive for myalgias.  Neurological: Positive for weakness and headaches. Negative for dizziness.    Blood pressure 134/81, pulse 87, temperature 99.1 F (37.3 C), temperature source Rectal, resp. rate 18, height '5\' 3"'  (1.6 m), weight 83.3 kg (183 lb 10.3 oz), SpO2 96.00%. Physical Exam  Constitutional: She is oriented to person,  place, and time. She appears well-developed and well-nourished.  Cardiovascular: Normal rate.   No murmur heard. Respiratory: Effort normal. She has wheezes.  GI: Soft. She exhibits distension. There is tenderness.  Musculoskeletal: Normal range of motion.  Neurological: She is alert and oriented to person, place, and time.  Skin: Skin is warm and dry.  Psychiatric: She has a normal mood and affect. Her behavior is normal. Judgment and thought content normal.     Assessment/Plan Worsening abd pain; diverticulitis Large pelvic fluid collection CT 2/26 Now scheduled for pelvic abscess drain placement Dtr and pt are aware of procedure benefits and risks and agreeable to proceed Consent signed and in chart Hep Inj 532 am today- held now til 2/28   Elna Radovich A 06/07/2013, 10:58 AM

## 2013-06-07 NOTE — Progress Notes (Signed)
ANTIBIOTIC CONSULT NOTE - INITIAL  Pharmacy Consult for vancomycin and cefepime Indication: rule out pneumonia and diverticulitis abscess  Allergies  Allergen Reactions  . Sulfonamide Derivatives Other (See Comments)    Childhood reaction    Patient Measurements: Height: 5\' 3"  (160 cm) Weight: 183 lb 10.3 oz (83.3 kg) IBW/kg (Calculated) : 52.4  Vital Signs: Temp: 98.3 F (36.8 C) (02/27 0322) Temp src: Oral (02/27 0322) BP: 134/81 mmHg (02/27 0322) Pulse Rate: 87 (02/27 0322)  Labs:  Recent Labs  06/06/13 2244 06/06/13 2333  WBC 11.2*  --   HGB 11.4* 12.9  PLT 231  --   CREATININE 2.02* 2.10*   Estimated Creatinine Clearance: 21.9 ml/min (by C-G formula based on Cr of 2.1).   Microbiology: No results found for this or any previous visit (from the past 720 hour(s)).  Medical History: Past Medical History  Diagnosis Date  . Diabetes mellitus   . Obesity   . COPD (chronic obstructive pulmonary disease)     Moderate. PFTs (12/10): FVC 67%, FEV1 58%, ratio 60%, TLC 95%, RV 138%, DLCO 43% (moderate obstructive defect). She is on home oxygen that should be worn at all times.  . Diastolic CHF, chronic     Echo (3/12): EF 55-60%, mild LV hypertrophy, grade I diastolic dysfunction, mild left atrial enlargement, normal pulmonary artery pressure  . CKD (chronic kidney disease)     Cr 2.4 when last checked. ANCA +, pauci-immune glomerulnephritis followed by Dr. Arrie Aranoladonato  . PNA (pneumonia)     h/o with ARDS in 2005; [facility]  . Diverticulum     Jejunal  . OSA (obstructive sleep apnea)     possible. When pt was sedated for TEE in May 2010, she did develop some upper airway obstruction  . Anemia     of chronic disease  . Hypothyroidism   . Gout   . Atrial fibrillation 07/2008    Failed cardioversion with TEE guidance in May 2010. Pt went back to NSR by 11/2008 with amiodarne  . Hypertension   . GERD  (gastroesophageal reflux disease)   . Peripheral vascular disease   . Pneumonia   . Chronic kidney disease   . Respiratory failure, chronic 07/01/2011  . Chronic gout due to renal impairment 07/01/2011    Medications:  Prescriptions prior to admission  Medication Sig Dispense Refill  . albuterol (PROVENTIL) (2.5 MG/3ML) 0.083% nebulizer solution Take 3 mLs (2.5 mg total) by nebulization 2 (two) times daily. Dx: 492.8 COPD with Emphysema  270 mL  5  . bisoprolol (ZEBETA) 10 MG tablet Take 5 mg by mouth daily.      Marland Kitchen. guaifenesin (HUMIBID E) 400 MG TABS Take 400 mg by mouth 2 (two) times daily.       Marland Kitchen. levothyroxine (SYNTHROID, LEVOTHROID) 50 MCG tablet Take 50 mcg by mouth daily.      . Multiple Vitamin (MULITIVITAMIN WITH MINERALS) TABS Take 1 tablet by mouth daily.      . potassium chloride SA (K-DUR,KLOR-CON) 20 MEQ tablet Take 20 mEq by mouth daily.      . predniSONE (DELTASONE) 10 MG tablet Take 10 mg by mouth daily with breakfast.      . ranitidine (ZANTAC) 150 MG capsule Take 150 mg by mouth every evening.        . tiotropium (SPIRIVA HANDIHALER) 18 MCG inhalation capsule Place 1 capsule (18 mcg total) into inhaler and inhale daily.  30 capsule  11  .  torsemide (DEMADEX) 20 MG tablet Take 80 mg by mouth daily.      . traMADol (ULTRAM) 50 MG tablet Take 0.5 tablets (25 mg total) by mouth every 12 (twelve) hours as needed for moderate pain.  10 tablet  0  . warfarin (COUMADIN) 2 MG tablet Take 2 mg by mouth daily at 6 PM.       Scheduled:  . albuterol  2.5 mg Nebulization BID  . bisoprolol  5 mg Oral Daily  . ceFEPime (MAXIPIME) IV  1 g Intravenous Q24H  . heparin  5,000 Units Subcutaneous 3 times per day  . levothyroxine  50 mcg Oral QAC breakfast  . tiotropium  18 mcg Inhalation Daily  . vancomycin  1,250 mg Intravenous Q24H    Assessment: 80yo female presented to ED w/ confusion and disorientation, had previously been admitted for diverticulitis, now CT reveals diverticulitis  w/ associated abscess, also w/ CXR concerning for PNA, to begin IV ABX.  Noted pt on Coumadin PTA, INR low, currently w/ potential need for surgery.  Goal of Therapy:  Vancomycin trough level 15-20 mcg/ml  Plan:  Will begin vancomycin 1250mg  IV Q24H and cefepime 1g IV Q24H and monitor CBC, Cx, levels prn.  Will f/u whether pt needs to resume Coumadin vs hold for OR.  Vernard Gambles, PharmD, BCPS  06/07/2013,3:55 AM

## 2013-06-08 DIAGNOSIS — R4182 Altered mental status, unspecified: Secondary | ICD-10-CM

## 2013-06-08 LAB — GLUCOSE, CAPILLARY
Glucose-Capillary: 111 mg/dL — ABNORMAL HIGH (ref 70–99)
Glucose-Capillary: 165 mg/dL — ABNORMAL HIGH (ref 70–99)
Glucose-Capillary: 153 mg/dL — ABNORMAL HIGH (ref 70–99)
Glucose-Capillary: 122 mg/dL — ABNORMAL HIGH (ref 70–99)

## 2013-06-08 LAB — PROTIME-INR
INR: 1.23 (ref 0.00–1.49)
Prothrombin Time: 15.2 seconds (ref 11.6–15.2)

## 2013-06-08 NOTE — Progress Notes (Signed)
ANTICOAGULATION CONSULT NOTE - Follow up Consult  Pharmacy Consult for Coumadin Indication: atrial fibrillation  Allergies  Allergen Reactions  . Sulfonamide Derivatives Other (See Comments)    Childhood reaction    Patient Measurements: Height: 5\' 3"  (160 cm) Weight: 183 lb 10.3 oz (83.3 kg) IBW/kg (Calculated) : 52.4  Vital Signs: Temp: 98.6 F (37 C) (02/28 0554) Temp src: Oral (02/28 0554) BP: 124/86 mmHg (02/28 0554) Pulse Rate: 90 (02/28 0554)  Labs:  Recent Labs  06/06/13 2244 06/06/13 2333 06/07/13 0426 06/08/13 0420  HGB 11.4* 12.9 10.9*  --   HCT 36.6 38.0 34.9*  --   PLT 231  --  202  --   LABPROT 15.4*  --   --  15.2  INR 1.25  --   --  1.23  CREATININE 2.02* 2.10* 1.78*  --     Estimated Creatinine Clearance: 25.8 ml/min (by C-G formula based on Cr of 1.78).   Medical History: Past Medical History  Diagnosis Date  . Diabetes mellitus   . Obesity   . COPD (chronic obstructive pulmonary disease)     Moderate. PFTs (12/10): FVC 67%, FEV1 58%, ratio 60%, TLC 95%, RV 138%, DLCO 43% (moderate obstructive defect). She is on home oxygen that should be worn at all times.  . Diastolic CHF, chronic     Echo (3/12): EF 55-60%, mild LV hypertrophy, grade I diastolic dysfunction, mild left atrial enlargement, normal pulmonary artery pressure  . CKD (chronic kidney disease)     Cr 2.4 when last checked. ANCA +, pauci-immune glomerulnephritis followed by Dr. Arrie Aranoladonato  . PNA (pneumonia)     h/o with ARDS in 2005; MRSA P[facility]  . Diverticulum     Jejunal  . OSA (obstructive sleep apnea)     possible. When pt was sedated for TEE in May 2010, she did develop some upper airway obstruction  . Anemia     of chronic disease  . Hypothyroidism   . Gout   . Atrial fibrillation 07/2008    Failed cardioversion with TEE guidance in May 2010. Pt went back to NSR by 11/2008 with amiodarne  . Hypertension   . GERD  (gastroesophageal reflux disease)   . Peripheral vascular disease   . Pneumonia   . Chronic kidney disease   . Respiratory failure, chronic 07/01/2011  . Chronic gout due to renal impairment 07/01/2011    Medications:  Prescriptions prior to admission  Medication Sig Dispense Refill  . albuterol (PROVENTIL) (2.5 MG/3ML) 0.083% nebulizer solution Take 3 mLs (2.5 mg total) by nebulization 2 (two) times daily. Dx: 492.8 COPD with Emphysema  270 mL  5  . bisoprolol (ZEBETA) 10 MG tablet Take 5 mg by mouth daily.      Marland Kitchen. guaifenesin (HUMIBID E) 400 MG TABS Take 400 mg by mouth 2 (two) times daily.       Marland Kitchen. levothyroxine (SYNTHROID, LEVOTHROID) 50 MCG tablet Take 50 mcg by mouth daily.      . Multiple Vitamin (MULITIVITAMIN WITH MINERALS) TABS Take 1 tablet by mouth daily.      . potassium chloride SA (K-DUR,KLOR-CON) 20 MEQ tablet Take 20 mEq by mouth daily.      . predniSONE (DELTASONE) 10 MG tablet Take 10 mg by mouth daily with breakfast.      . ranitidine (ZANTAC) 150 MG capsule Take 150 mg by mouth every evening.        . tiotropium (SPIRIVA HANDIHALER) 18  MCG inhalation capsule Place 1 capsule (18 mcg total) into inhaler and inhale daily.  30 capsule  11  . torsemide (DEMADEX) 20 MG tablet Take 80 mg by mouth daily.      . traMADol (ULTRAM) 50 MG tablet Take 0.5 tablets (25 mg total) by mouth every 12 (twelve) hours as needed for moderate pain.  10 tablet  0  . warfarin (COUMADIN) 2 MG tablet Take 2 mg by mouth daily at 6 PM.       Scheduled:  . albuterol  2.5 mg Nebulization BID  . bisoprolol  5 mg Oral Daily  . budesonide (PULMICORT) nebulizer solution  0.25 mg Nebulization BID  . ceFEPime (MAXIPIME) IV  1 g Intravenous Q24H  . guaiFENesin  600 mg Oral BID  . heparin  5,000 Units Subcutaneous 3 times per day  . levothyroxine  50 mcg Oral QAC breakfast  . oseltamivir  30 mg Oral Daily  . predniSONE  10 mg Oral Q breakfast  . tiotropium  18 mcg Inhalation Daily  . vancomycin  1,250 mg  Intravenous Q24H  . Warfarin - Pharmacist Dosing Inpatient   Does not apply q1800    Assessment: 80yo female admitted for diverticulitis and possible PNA was to continue Coumadin for Afib; last dose PTA was 2/25 with subtherapeutic INR currently.  Patient on heparin for anticipation of possible surgery per MD for now.  Goal of Therapy:  INR 2-3   Plan:  -Continue to hold coumadin for now per MD note: patient may need surgery -Stop daily INR labs for now, will reorder when restarting coumadin  Anabel Bene, PharmD Clinical Pharmacist Resident Pager: [phone]   06/08/2013,8:19 AM

## 2013-06-08 NOTE — Progress Notes (Signed)
[facility] PROGRESS NOTE  [name] KGM:[id number] DOB: 1930-05-25 DOA: 06/06/2013 PCP: Shan Levans, MD  Assessment/Plan: 1-toxic encephalopathy: from diverticular abscess, PNA and influenza. -continue abx's, supportive care and tamiflu -PRN antiemetics and pain meds -patient AAOX3; afebrile. -status post drain by IR; ok to advance diet to CLD  2-HCAP/influenza: will continue treatment with vanc and cefepime; continue tamiflu. -continue supportive care -continue oxygen supplementation, pulmicort, flutter valve and PRN nebulizers  3-Atrial fibrillation: rate controlled. -will continue heparin per pharmacy; holding coumadin in case patient needs surgery  4-COPD with chronic resp failure: stable and actively no wheezing -will continue home inhalers -chronic prednisone and oxygen supplementation  5-CKD: stable and at baseline. Will monitor -Cr 1.7 and by GFR essentially at baseline  6-hypothyroidism: continue synthroid  7-chronic diastolic heart failure: compensated at this moment. -will check daily weights and strict I's and O's. -Last EF 60-65%  DVT: on heparin  Code Status: Full Family Communication: no family at bedside  Disposition Plan: to be determine   Consultants:  General surgery  IR   Procedures:  See below for x-ray reports  Abscess drain placement by IR (2/27)  Antibiotics:  vanc and cefepime   HPI/Subjective: Afebrile; still complaining of pain in her abd, but improved. Patient is AAOX3. Denies SOB.  Objective: Filed Vitals:   06/08/13 1437  BP: 138/65  Pulse: 76  Temp: 97.4 F (36.3 C)  Resp: 20    Intake/Output Summary (Last 24 hours) at 06/08/13 1530 Last data filed at 06/08/13 1259  Gross per 24 hour  Intake     35 ml  Output   1050 ml  Net  -1015 ml   Filed Weights   06/07/13 0322 06/08/13 0554  Weight: 83.3 kg (183 lb 10.3 oz) 83.3 kg (183 lb 10.3 oz)    Exam:   General:  AAOX3, No fever  currently  Cardiovascular: irregular, no rubs or gallops  Respiratory: no wheezing; positive rhonchi  Abdomen: LLQ tenderness, positive BS, no guarding, no distension  Musculoskeletal: no joint swelling.  Data Reviewed: Basic Metabolic Panel:  Recent Labs Lab 06/06/13 2244 06/06/13 2333 06/07/13 0426  NA 132* 135* 137  K 4.6 4.6 4.3  CL 89* 95* 97  CO2 25  --  25  GLUCOSE 194* 198* 160*  BUN 62* 77* 57*  CREATININE 2.02* 2.10* 1.78*  CALCIUM 8.6  --  8.3*   Liver Function Tests:  Recent Labs Lab 06/06/13 2244 06/07/13 0426  AST 48* 32  ALT 49* 38*  ALKPHOS 230* 201*  BILITOT 0.3 0.2*  PROT 6.7 6.2  ALBUMIN 2.7* 2.4*    Recent Labs Lab 06/06/13 2244  LIPASE 20   CBC:  Recent Labs Lab 06/06/13 2244 06/06/13 2333 06/07/13 0426  WBC 11.2*  --  8.6  NEUTROABS 9.6*  --  6.7  HGB 11.4* 12.9 10.9*  HCT 36.6 38.0 34.9*  MCV 91.3  --  91.1  PLT 231  --  202   BNP (last 3 results)  Recent Labs  07/25/12 0822 12/05/12 1558 06/06/13 2244  PROBNP 399.0* 277.0* 1764.0*   CBG:  Recent Labs Lab 06/07/13 0627 06/07/13 1117 06/07/13 1642 06/08/13 0558 06/08/13 1125  GLUCAP 114* 128* 102* 111* 165*    Recent Results (from the past 240 hour(s))  URINE CULTURE     Status: None   Collection Time    06/06/13  8:25 PM      Result Value Ref Range Status   Specimen Description URINE, CATHETERIZED  Final   Special Requests ADDED [id number]   Final   Culture  Setup Time     Final   Value: 06/07/2013 01:19     Performed at [facility]   Colony Count     Final   Value: NO GROWTH     Performed at [facility]   Culture     Final   Value: NO GROWTH     Performed at [facility]   Report Status 06/07/2013 FINAL   Final  CULTURE, BLOOD (ROUTINE X 2)     Status: None   Collection Time    06/06/13 11:20 PM      Result Value Ref Range Status   Specimen Description BLOOD RIGHT WRIST   Final   Special Requests BOTTLES DRAWN  AEROBIC AND ANAEROBIC 3CC   Final   Culture  Setup Time     Final   Value: 06/07/2013 03:27     Performed at [facility]   Culture     Final   Value:        BLOOD CULTURE RECEIVED NO GROWTH TO DATE CULTURE WILL BE HELD FOR 5 DAYS BEFORE ISSUING A FINAL NEGATIVE REPORT     Performed at [facility]   Report Status PENDING   Incomplete  CULTURE, BLOOD (ROUTINE X 2)     Status: None   Collection Time    06/06/13 11:30 PM      Result Value Ref Range Status   Specimen Description BLOOD RIGHT HAND   Final   Special Requests BOTTLES DRAWN AEROBIC AND ANAEROBIC 5CC   Final   Culture  Setup Time     Final   Value: 06/07/2013 03:27     Performed at [facility]   Culture     Final   Value:        BLOOD CULTURE RECEIVED NO GROWTH TO DATE CULTURE WILL BE HELD FOR 5 DAYS BEFORE ISSUING A FINAL NEGATIVE REPORT     Performed at [facility]   Report Status PENDING   Incomplete  MRSA PCR SCREENING     Status: None   Collection Time    06/07/13  3:23 AM      Result Value Ref Range Status   MRSA by PCR NEGATIVE  NEGATIVE Final   Comment:            The GeneXpert MRSA Assay (FDA     approved for NASAL specimens     only), is one component of a     comprehensive MRSA colonization     surveillance program. It is not     intended to diagnose MRSA     infection nor to guide or     monitor treatment for     MRSA infections.  CULTURE, ROUTINE-ABSCESS     Status: None   Collection Time    06/07/13  6:48 PM      Result Value Ref Range Status   Specimen Description PERITONEAL CAVITY   Final   Special Requests DIVERTICULAR ABSCESS   Final   Gram Stain     Final   Value: MODERATE WBC PRESENT,BOTH PMN AND MONONUCLEAR     NO SQUAMOUS EPITHELIAL CELLS SEEN     RARE GRAM POSITIVE COCCI     RARE GRAM NEGATIVE RODS     RARE GRAM POSITIVE RODS   Culture PENDING   Incomplete   Report Status PENDING   Incomplete  Studies: Ct Abdomen Pelvis Wo Contrast  06/07/2013    CLINICAL DATA:  Altered mental status, fever and weakness. Abdominal pain, nausea and vomiting.  EXAM: CT ABDOMEN AND PELVIS WITHOUT CONTRAST  TECHNIQUE: Multidetector CT imaging of the abdomen and pelvis was performed following the standard protocol without intravenous contrast.  COMPARISON:  CT of the abdomen and pelvis performed 03/20/2013, and abdominal ultrasound performed 03/21/2013  FINDINGS: Mild left basilar atelectasis is noted.  The mildly nodular contour of the liver may reflect mild cirrhotic change. A few small stones are seen layering dependently within the gallbladder; the gallbladder is otherwise unremarkable in appearance. The pancreas and adrenal glands are unremarkable.  Mild bilateral renal atrophy is noted. A 1.6 cm cyst is noted arising along the posterior aspect of the right kidney. There is no evidence of hydronephrosis. No renal or ureteral stones are seen. No significant perinephric stranding is appreciated.  No free fluid is identified. The small bowel is unremarkable in appearance. The stomach is within normal limits. No acute vascular abnormalities are seen. There is diffuse tortuosity of the abdominal aorta, with slight ectasia of the distal abdominal aorta. Diffuse calcification is seen along the abdominal aorta and its branches.  The appendix is normal in caliber, without evidence for appendicitis. Contrast progresses to the level of the mid transverse colon. Diffuse diverticulosis is noted along the sigmoid colon.  There is a enlarged 4.6 x 3.6 x 2.9 cm collection of fluid and air tracking adjacent to the uterus and left side of the bladder, containing minimal high density debris. There is also an enlarging collection of fluid and air noted along the upper left pelvic sidewall, measuring 6.3 x 2.5 x 4.4 cm. These are directly adjacent to the left ovary.  On comparison with multiple prior CTs, the patient did not have a large colonic diverticulum in this region. Stool within the  pelvic sidewall collection on the prior study likely reflected a fistulous connection to a complex abscess in the pelvis. An inflamed diverticulum would not demonstrate this degree of enlargement over time. Surrounding soft tissue inflammation is seen. Communication between these abscesses is not well assessed.  The bladder is moderately distended and grossly unremarkable in appearance, though displaced to the right by the patient's abscess. The uterus is difficult to fully assess, as it extends directly adjacent to the abscess, but appears grossly unremarkable, aside from a single tiny focus of air at the cervix, likely within normal limits. No inguinal lymphadenopathy is seen.  No acute osseous abnormalities are identified. 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  IMPRESSION: 1. Enlarging mildly complex abscesses noted within the pelvis. 4.6 x 3.6 x 2.9 cm collection of fluid and air tracking adjacent to the uterus and left side of the bladder has increased in size from the prior study, and now contains minimal high density debris. An additional enlarging collection of fluid and air along the upper left pelvic sidewall measures 6.3 x 2.5 x 4.4 cm. Stool was noted in the pelvic sidewall collection on the prior study, likely reflecting a fistulous connection to the abscess. No large colonic diverticula were present in this region on prior CTs to suggest a thick-walled diverticulum, and a colonic diverticulum would not demonstrate this degree of enlargement and irregularity over time. Surrounding soft tissue inflammation noted. 2. Diffuse diverticulosis along the sigmoid colon. 3. Cholelithiasis; gallbladder otherwise unremarkable in appearance. 4. Mild bilateral renal atrophy seen.  Small right renal cyst noted. 5. Diffuse calcification  along the abdominal aorta and its branches. 6. Mildly nodular contour of the liver may reflect mild cirrhotic change. 7. Mild left basilar atelectasis noted.   These results were called by telephone at the time of interpretation on 06/07/2013 at 1:51 AM to [facility], who verbally acknowledged these results.   Electronically Signed   By: Roanna Raider M.D.   On: 06/07/2013 01:54   Dg Chest 2 View  06/06/2013   CLINICAL DATA:  Weakness cough  EXAM: CHEST  2 VIEW  COMPARISON:  03/21/2013 prior chest radiographs  FINDINGS: Cardiomegaly noted.  Left lower lobe opacity is suspicious for pneumonia.  There is no evidence of pulmonary edema, suspicious pulmonary nodule/mass, pleural effusion, or pneumothorax. No acute bony abnormalities are identified.  IMPRESSION: Probable left lower lobe pneumonia -radiographic followup to resolution recommended.  Cardiomegaly.   Electronically Signed   By: Laveda Abbe M.D.   On: 06/06/2013 21:22   Ct Head Wo Contrast  06/07/2013   CLINICAL DATA:  Altered mental status, weakness and fever. Abdominal pain, nausea and vomiting.  EXAM: CT HEAD WITHOUT CONTRAST  TECHNIQUE: Contiguous axial images were obtained from the base of the skull through the vertex without intravenous contrast.  COMPARISON:  None.  FINDINGS: There is no evidence of acute infarction, mass lesion, or intra- or extra-axial hemorrhage on CT.  Prominence of the ventricles and sulci reflects mild cortical volume loss. Scattered periventricular and subcortical white matter change likely reflects small vessel ischemic microangiopathy. Mild cerebellar atrophy is noted.  The brainstem and fourth ventricle are within normal limits. The basal ganglia are unremarkable in appearance. The cerebral hemispheres demonstrate grossly normal gray-white differentiation. No mass effect or midline shift is seen.  There is no evidence of fracture; visualized osseous structures are unremarkable in appearance. The orbits are within normal limits. A mucus retention cyst or polyp is noted at the left maxillary sinus. There is opacification of the right side of the sphenoid sinus. The remaining  paranasal sinuses and mastoid air cells are well-aerated. No significant soft tissue abnormalities are seen.  IMPRESSION: 1. No acute intracranial pathology seen on CT. 2. Mild cortical volume loss and scattered small vessel ischemic microangiopathy. 3. Mucus retention cyst or polyp at the left maxillary sinus; opacification of the right side of the sphenoid sinus.   Electronically Signed   By: Roanna Raider M.D.   On: 06/07/2013 01:26   Ct Image Guided Drainage By Percutaneous Catheter  06/07/2013   CLINICAL DATA:  80 year old female with a diverticular abscess, leukocytosis and fever. She is previously treated with antibiotics unsuccessfully. The abscess cavity has enlarged and now extends to the left pelvic sidewall. The percutaneous window is very small. CT guided drain placement will be attempted.  EXAM: CT GUIDED DRAINAGE OF deep pelvic diverticular ABSCESS  ANESTHESIA/SEDATION: 1 Mg IV Versed 50 mcg IV Fentanyl  Total Moderate Sedation Time:  25 minutes  PROCEDURE: The procedure, risks, benefits, and alternatives were explained to the patient. Questions regarding the procedure were encouraged and answered. The patient understands and consents to the procedure.  A planning axial CT scan was performed. Multiple repositionings were required before a suitable window could be found. An appropriate skin entry site was selected and marked.  The left lower quadrant was prepped with Betadinein a sterile fashion, and a sterile drape was applied covering the operative field. A sterile gown and sterile gloves were used for the procedure. Local anesthesia was provided with 1% Lidocaine.  A small dermatotomy  was made. Under CT fluoroscopic guidance, a 18 gauge trocar needle was carefully advanced through the iliacus muscle closely adjacent to the inferior left ilium and into the the fluid and gas collection in the left pelvic sidewall. Care was taken to steady medial to the adjacent iliac vessels. A wire was then  coiled in the fluid and gas collection. The tract was serially dilated to 6912 [place] and a [place] 10 French all-purpose drainage catheter was formed within the collection. Approximately 15 mL of purulent and bloody material was successfully aspirated.  The catheter was secured to the skin with 0 silk suture and connected to JP bulb suction. The patient tolerated the procedure well; there was no immediate complication.  COMPLICATIONS: None  FINDINGS: As above  IMPRESSION: 1. Technically successful placement of a 10 French percutaneous drainage catheter into the left pelvic sidewall abscess collection with aspiration of 15 mL of purulent, bloody fluid. The drain was left to JP bulb suction. Recommend repeat CT scan and/ tube injection under fluoroscopy to exclude fistulous connection to the adjacent sigmoid colon prior to tube removal.  Signed,  Sterling BigHeath K. McCullough, MD  [facility]  [facility]   Electronically Signed   By: Malachy MoanHeath  McCullough M.D.   On: 06/07/2013 18:50    Scheduled Meds: . albuterol  2.5 mg Nebulization BID  . bisoprolol  5 mg Oral Daily  . budesonide (PULMICORT) nebulizer solution  0.25 mg Nebulization BID  . ceFEPime (MAXIPIME) IV  1 g Intravenous Q24H  . guaiFENesin  600 mg Oral BID  . heparin  5,000 Units Subcutaneous 3 times per day  . levothyroxine  50 mcg Oral QAC breakfast  . oseltamivir  30 mg Oral Daily  . predniSONE  10 mg Oral Q breakfast  . tiotropium  18 mcg Inhalation Daily  . vancomycin  1,250 mg Intravenous Q24H  . Warfarin - Pharmacist Dosing Inpatient   Does not apply q1800   Continuous Infusions: . dextrose 5 % and 0.45% NaCl 1,000 mL (06/07/13 1848)    Principal Problem:   Colonic diverticular abscess Active Problems:   HYPOTHYROIDISM   Atrial fibrillation   DIASTOLIC HEART FAILURE, CHRONIC   COPD with emphysema   Osteoarthritis   Oxygen dependent   Chronic gout due to renal impairment   Chronic kidney  disease (CKD), stage IV     Time spent: >30 minutes    Domnick Chervenak  [facility] Pager [phone]. If 7PM-7AM, please contact night-coverage at [email or web address], password [email or web address] 06/08/2013, 3:30 PM  LOS: 2 days

## 2013-06-08 NOTE — Progress Notes (Addendum)
JP drain is patent and draining a serosanguinous purulent  drainage.  Denies any abdominal pain at this time.  1818  Client incontinent of large amount of urine.  Skin on buttocks is red with small  Area to right buttock is red and blanches .  Protective skin barrier put on skin and under breast.  .

## 2013-06-08 NOTE — Progress Notes (Signed)
Subjective: Pt feeling a little better today. Sitting up in bed drinking clears. Some pain at drain site  Objective: Physical Exam: BP 124/86  Pulse 90  Temp(Src) 98.6 F (37 C) (Oral)  Resp 22  Ht 5\' 3"  (1.6 m)  Wt 183 lb 10.3 oz (83.3 kg)  BMI 32.54 kg/m2  SpO2 100% LLQ drain intact, site clean, mildly tender Output blood tinged purulence.    Labs: CBC  Recent Labs  06/06/13 2244 06/06/13 2333 06/07/13 0426  WBC 11.2*  --  8.6  HGB 11.4* 12.9 10.9*  HCT 36.6 38.0 34.9*  PLT 231  --  202   BMET  Recent Labs  06/06/13 2244 06/06/13 2333 06/07/13 0426  NA 132* 135* 137  K 4.6 4.6 4.3  CL 89* 95* 97  CO2 25  --  25  GLUCOSE 194* 198* 160*  BUN 62* 77* 57*  CREATININE 2.02* 2.10* 1.78*  CALCIUM 8.6  --  8.3*   LFT  Recent Labs  06/06/13 2244 06/07/13 0426  PROT 6.7 6.2  ALBUMIN 2.7* 2.4*  AST 48* 32  ALT 49* 38*  ALKPHOS 230* 201*  BILITOT 0.3 0.2*  LIPASE 20  --    PT/INR  Recent Labs  06/06/13 2244 06/08/13 0420  LABPROT 15.4* 15.2  INR 1.25 1.23     Studies/Results: Ct Abdomen Pelvis Wo Contrast  06/07/2013   CLINICAL DATA:  Altered mental status, fever and weakness. Abdominal pain, nausea and vomiting.  EXAM: CT ABDOMEN AND PELVIS WITHOUT CONTRAST  TECHNIQUE: Multidetector CT imaging of the abdomen and pelvis was performed following the standard protocol without intravenous contrast.  COMPARISON:  CT of the abdomen and pelvis performed 03/20/2013, and abdominal ultrasound performed 03/21/2013  FINDINGS: Mild left basilar atelectasis is noted.  The mildly nodular contour of the liver may reflect mild cirrhotic change. A few small stones are seen layering dependently within the gallbladder; the gallbladder is otherwise unremarkable in appearance. The pancreas and adrenal glands are unremarkable.  Mild bilateral renal atrophy is noted. A 1.6 cm cyst is noted arising along the posterior aspect of the right kidney. There is no evidence of  hydronephrosis. No renal or ureteral stones are seen. No significant perinephric stranding is appreciated.  No free fluid is identified. The small bowel is unremarkable in appearance. The stomach is within normal limits. No acute vascular abnormalities are seen. There is diffuse tortuosity of the abdominal aorta, with slight ectasia of the distal abdominal aorta. Diffuse calcification is seen along the abdominal aorta and its branches.  The appendix is normal in caliber, without evidence for appendicitis. Contrast progresses to the level of the mid transverse colon. Diffuse diverticulosis is noted along the sigmoid colon.  There is a enlarged 4.6 x 3.6 x 2.9 cm collection of fluid and air tracking adjacent to the uterus and left side of the bladder, containing minimal high density debris. There is also an enlarging collection of fluid and air noted along the upper left pelvic sidewall, measuring 6.3 x 2.5 x 4.4 cm. These are directly adjacent to the left ovary.  On comparison with multiple prior CTs, the patient did not have a large colonic diverticulum in this region. Stool within the pelvic sidewall collection on the prior study likely reflected a fistulous connection to a complex abscess in the pelvis. An inflamed diverticulum would not demonstrate this degree of enlargement over time. Surrounding soft tissue inflammation is seen. Communication between these abscesses is not well assessed.  The bladder is  moderately distended and grossly unremarkable in appearance, though displaced to the right by the patient's abscess. The uterus is difficult to fully assess, as it extends directly adjacent to the abscess, but appears grossly unremarkable, aside from a single tiny focus of air at the cervix, likely within normal limits. No inguinal lymphadenopathy is seen.  No acute osseous abnormalities are identified. 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  IMPRESSION: 1. Enlarging mildly complex  abscesses noted within the pelvis. 4.6 x 3.6 x 2.9 cm collection of fluid and air tracking adjacent to the uterus and left side of the bladder has increased in size from the prior study, and now contains minimal high density debris. An additional enlarging collection of fluid and air along the upper left pelvic sidewall measures 6.3 x 2.5 x 4.4 cm. Stool was noted in the pelvic sidewall collection on the prior study, likely reflecting a fistulous connection to the abscess. No large colonic diverticula were present in this region on prior CTs to suggest a thick-walled diverticulum, and a colonic diverticulum would not demonstrate this degree of enlargement and irregularity over time. Surrounding soft tissue inflammation noted. 2. Diffuse diverticulosis along the sigmoid colon. 3. Cholelithiasis; gallbladder otherwise unremarkable in appearance. 4. Mild bilateral renal atrophy seen.  Small right renal cyst noted. 5. Diffuse calcification along the abdominal aorta and its branches. 6. Mildly nodular contour of the liver may reflect mild cirrhotic change. 7. Mild left basilar atelectasis noted.  These results were called by telephone at the time of interpretation on 06/07/2013 at 1:51 AM to [facility] PA, who verbally acknowledged these results.   Electronically Signed   By: Roanna Raider M.D.   On: 06/07/2013 01:54   Dg Chest 2 View  06/06/2013   CLINICAL DATA:  Weakness cough  EXAM: CHEST  2 VIEW  COMPARISON:  03/21/2013 prior chest radiographs  FINDINGS: Cardiomegaly noted.  Left lower lobe opacity is suspicious for pneumonia.  There is no evidence of pulmonary edema, suspicious pulmonary nodule/mass, pleural effusion, or pneumothorax. No acute bony abnormalities are identified.  IMPRESSION: Probable left lower lobe pneumonia -radiographic followup to resolution recommended.  Cardiomegaly.   Electronically Signed   By: Laveda Abbe M.D.   On: 06/06/2013 21:22   Ct Head Wo Contrast  06/07/2013   CLINICAL DATA:   Altered mental status, weakness and fever. Abdominal pain, nausea and vomiting.  EXAM: CT HEAD WITHOUT CONTRAST  TECHNIQUE: Contiguous axial images were obtained from the base of the skull through the vertex without intravenous contrast.  COMPARISON:  None.  FINDINGS: There is no evidence of acute infarction, mass lesion, or intra- or extra-axial hemorrhage on CT.  Prominence of the ventricles and sulci reflects mild cortical volume loss. Scattered periventricular and subcortical white matter change likely reflects small vessel ischemic microangiopathy. Mild cerebellar atrophy is noted.  The brainstem and fourth ventricle are within normal limits. The basal ganglia are unremarkable in appearance. The cerebral hemispheres demonstrate grossly normal gray-white differentiation. No mass effect or midline shift is seen.  There is no evidence of fracture; visualized osseous structures are unremarkable in appearance. The orbits are within normal limits. A mucus retention cyst or polyp is noted at the left maxillary sinus. There is opacification of the right side of the sphenoid sinus. The remaining paranasal sinuses and mastoid air cells are well-aerated. No significant soft tissue abnormalities are seen.  IMPRESSION: 1. No acute intracranial pathology seen on CT. 2. Mild cortical volume loss and  scattered small vessel ischemic microangiopathy. 3. Mucus retention cyst or polyp at the left maxillary sinus; opacification of the right side of the sphenoid sinus.   Electronically Signed   By: Roanna Raider M.D.   On: 06/07/2013 01:26   Ct Image Guided Drainage By Percutaneous Catheter  06/07/2013   CLINICAL DATA:  80 year old female with a diverticular abscess, leukocytosis and fever. She is previously treated with antibiotics unsuccessfully. The abscess cavity has enlarged and now extends to the left pelvic sidewall. The percutaneous window is very small. CT guided drain placement will be attempted.  EXAM: CT GUIDED  DRAINAGE OF deep pelvic diverticular ABSCESS  ANESTHESIA/SEDATION: 1 Mg IV Versed 50 mcg IV Fentanyl  Total Moderate Sedation Time:  25 minutes  PROCEDURE: The procedure, risks, benefits, and alternatives were explained to the patient. Questions regarding the procedure were encouraged and answered. The patient understands and consents to the procedure.  A planning axial CT scan was performed. Multiple repositionings were required before a suitable window could be found. An appropriate skin entry site was selected and marked.  The left lower quadrant was prepped with Betadinein a sterile fashion, and a sterile drape was applied covering the operative field. A sterile gown and sterile gloves were used for the procedure. Local anesthesia was provided with 1% Lidocaine.  A small dermatotomy was made. Under CT fluoroscopic guidance, a 18 gauge trocar needle was carefully advanced through the iliacus muscle closely adjacent to the inferior left ilium and into the the fluid and gas collection in the left pelvic sidewall. Care was taken to steady medial to the adjacent iliac vessels. A wire was then coiled in the fluid and gas collection. The tract was serially dilated to 30 [place] and a [place] all-purpose drainage catheter was formed within the collection. Approximately 15 mL of purulent and bloody material was successfully aspirated.  The catheter was secured to the skin with 0 silk suture and connected to JP bulb suction. The patient tolerated the procedure well; there was no immediate complication.  COMPLICATIONS: None  FINDINGS: As above  IMPRESSION: 1. Technically successful placement of a 10 French percutaneous drainage catheter into the left pelvic sidewall abscess collection with aspiration of 15 mL of purulent, bloody fluid. The drain was left to JP bulb suction. Recommend repeat CT scan and/ tube injection under fluoroscopy to exclude fistulous connection to the adjacent sigmoid colon prior to tube  removal.  Signed,  Sterling Big, MD  [facility]  [facility]   Electronically Signed   By: Malachy Moan M.D.   On: 06/07/2013 18:50    Assessment/Plan: LLQ diverticulitis with abscess S/p perc drain 2/27 Cont flushes Other plans per Primary and [facility]    LOS: 2 days    Brayton El PA-C 06/08/2013 10:39 AM

## 2013-06-08 NOTE — Progress Notes (Signed)
Subjective: Patient had successful placement of percutaneous abscess drain yesterday.  Initially sore, but now feeling much better.  Minimal abdominal pain.  Drain with moderate amount of purulent drainage in bulb  Objective: Vital signs in last 24 hours: Temp:  [97.8 F (36.6 C)-98.6 F (37 C)] 98.6 F (37 C) (02/28 0554) Pulse Rate:  [79-100] 90 (02/28 0554) Resp:  [18-23] 22 (02/28 0554) BP: (119-169)/(63-95) 124/86 mmHg (02/28 0554) SpO2:  [93 %-100 %] 100 % (02/28 0554) Weight:  [183 lb 10.3 oz (83.3 kg)] 183 lb 10.3 oz (83.3 kg) (02/28 0554) Last BM Date: 06/06/13  Intake/Output from previous day: 02/27 0701 - 02/28 0700 In: 5  Out: 850 [Urine:850] Intake/Output this shift:    General appearance: alert, cooperative and no distress GI: obese, soft, minimal LLQ tenderness near drain insertion site Drain with purulent drainage in bulb  Lab Results:   Recent Labs  06/06/13 2244 06/06/13 2333 06/07/13 0426  WBC 11.2*  --  8.6  HGB 11.4* 12.9 10.9*  HCT 36.6 38.0 34.9*  PLT 231  --  202   BMET  Recent Labs  06/06/13 2244 06/06/13 2333 06/07/13 0426  NA 132* 135* 137  K 4.6 4.6 4.3  CL 89* 95* 97  CO2 25  --  25  GLUCOSE 194* 198* 160*  BUN 62* 77* 57*  CREATININE 2.02* 2.10* 1.78*  CALCIUM 8.6  --  8.3*   PT/INR  Recent Labs  06/06/13 2244 06/08/13 0420  LABPROT 15.4* 15.2  INR 1.25 1.23   ABG No results found for this basename: PHART, PCO2, PO2, HCO3,  in the last 72 hours  Studies/Results: Ct Abdomen Pelvis Wo Contrast  06/07/2013   CLINICAL DATA:  Altered mental status, fever and weakness. Abdominal pain, nausea and vomiting.  EXAM: CT ABDOMEN AND PELVIS WITHOUT CONTRAST  TECHNIQUE: Multidetector CT imaging of the abdomen and pelvis was performed following the standard protocol without intravenous contrast.  COMPARISON:  CT of the abdomen and pelvis performed 03/20/2013, and abdominal ultrasound performed 03/21/2013  FINDINGS: Mild left  basilar atelectasis is noted.  The mildly nodular contour of the liver may reflect mild cirrhotic change. A few small stones are seen layering dependently within the gallbladder; the gallbladder is otherwise unremarkable in appearance. The pancreas and adrenal glands are unremarkable.  Mild bilateral renal atrophy is noted. A 1.6 cm cyst is noted arising along the posterior aspect of the right kidney. There is no evidence of hydronephrosis. No renal or ureteral stones are seen. No significant perinephric stranding is appreciated.  No free fluid is identified. The small bowel is unremarkable in appearance. The stomach is within normal limits. No acute vascular abnormalities are seen. There is diffuse tortuosity of the abdominal aorta, with slight ectasia of the distal abdominal aorta. Diffuse calcification is seen along the abdominal aorta and its branches.  The appendix is normal in caliber, without evidence for appendicitis. Contrast progresses to the level of the mid transverse colon. Diffuse diverticulosis is noted along the sigmoid colon.  There is a enlarged 4.6 x 3.6 x 2.9 cm collection of fluid and air tracking adjacent to the uterus and left side of the bladder, containing minimal high density debris. There is also an enlarging collection of fluid and air noted along the upper left pelvic sidewall, measuring 6.3 x 2.5 x 4.4 cm. These are directly adjacent to the left ovary.  On comparison with multiple prior CTs, the patient did not have a large colonic diverticulum in this  region. Stool within the pelvic sidewall collection on the prior study likely reflected a fistulous connection to a complex abscess in the pelvis. An inflamed diverticulum would not demonstrate this degree of enlargement over time. Surrounding soft tissue inflammation is seen. Communication between these abscesses is not well assessed.  The bladder is moderately distended and grossly unremarkable in appearance, though displaced to the  right by the patient's abscess. The uterus is difficult to fully assess, as it extends directly adjacent to the abscess, but appears grossly unremarkable, aside from a single tiny focus of air at the cervix, likely within normal limits. No inguinal lymphadenopathy is seen.  No acute osseous abnormalities are identified. 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  IMPRESSION: 1. Enlarging mildly complex abscesses noted within the pelvis. 4.6 x 3.6 x 2.9 cm collection of fluid and air tracking adjacent to the uterus and left side of the bladder has increased in size from the prior study, and now contains minimal high density debris. An additional enlarging collection of fluid and air along the upper left pelvic sidewall measures 6.3 x 2.5 x 4.4 cm. Stool was noted in the pelvic sidewall collection on the prior study, likely reflecting a fistulous connection to the abscess. No large colonic diverticula were present in this region on prior CTs to suggest a thick-walled diverticulum, and a colonic diverticulum would not demonstrate this degree of enlargement and irregularity over time. Surrounding soft tissue inflammation noted. 2. Diffuse diverticulosis along the sigmoid colon. 3. Cholelithiasis; gallbladder otherwise unremarkable in appearance. 4. Mild bilateral renal atrophy seen.  Small right renal cyst noted. 5. Diffuse calcification along the abdominal aorta and its branches. 6. Mildly nodular contour of the liver may reflect mild cirrhotic change. 7. Mild left basilar atelectasis noted.  These results were called by telephone at the time of interpretation on 06/07/2013 at 1:51 AM to [facility] PA, who verbally acknowledged these results.   Electronically Signed   By: Roanna Raider M.D.   On: 06/07/2013 01:54   Dg Chest 2 View  06/06/2013   CLINICAL DATA:  Weakness cough  EXAM: CHEST  2 VIEW  COMPARISON:  03/21/2013 prior chest radiographs  FINDINGS: Cardiomegaly noted.  Left lower lobe  opacity is suspicious for pneumonia.  There is no evidence of pulmonary edema, suspicious pulmonary nodule/mass, pleural effusion, or pneumothorax. No acute bony abnormalities are identified.  IMPRESSION: Probable left lower lobe pneumonia -radiographic followup to resolution recommended.  Cardiomegaly.   Electronically Signed   By: Laveda Abbe M.D.   On: 06/06/2013 21:22   Ct Head Wo Contrast  06/07/2013   CLINICAL DATA:  Altered mental status, weakness and fever. Abdominal pain, nausea and vomiting.  EXAM: CT HEAD WITHOUT CONTRAST  TECHNIQUE: Contiguous axial images were obtained from the base of the skull through the vertex without intravenous contrast.  COMPARISON:  None.  FINDINGS: There is no evidence of acute infarction, mass lesion, or intra- or extra-axial hemorrhage on CT.  Prominence of the ventricles and sulci reflects mild cortical volume loss. Scattered periventricular and subcortical white matter change likely reflects small vessel ischemic microangiopathy. Mild cerebellar atrophy is noted.  The brainstem and fourth ventricle are within normal limits. The basal ganglia are unremarkable in appearance. The cerebral hemispheres demonstrate grossly normal gray-white differentiation. No mass effect or midline shift is seen.  There is no evidence of fracture; visualized osseous structures are unremarkable in appearance. The orbits are within normal limits. A mucus retention cyst  or polyp is noted at the left maxillary sinus. There is opacification of the right side of the sphenoid sinus. The remaining paranasal sinuses and mastoid air cells are well-aerated. No significant soft tissue abnormalities are seen.  IMPRESSION: 1. No acute intracranial pathology seen on CT. 2. Mild cortical volume loss and scattered small vessel ischemic microangiopathy. 3. Mucus retention cyst or polyp at the left maxillary sinus; opacification of the right side of the sphenoid sinus.   Electronically Signed   By: Roanna Raider  M.D.   On: 06/07/2013 01:26   Ct Image Guided Drainage By Percutaneous Catheter  06/07/2013   CLINICAL DATA:  80 year old female with a diverticular abscess, leukocytosis and fever. She is previously treated with antibiotics unsuccessfully. The abscess cavity has enlarged and now extends to the left pelvic sidewall. The percutaneous window is very small. CT guided drain placement will be attempted.  EXAM: CT GUIDED DRAINAGE OF deep pelvic diverticular ABSCESS  ANESTHESIA/SEDATION: 1 Mg IV Versed 50 mcg IV Fentanyl  Total Moderate Sedation Time:  25 minutes  PROCEDURE: The procedure, risks, benefits, and alternatives were explained to the patient. Questions regarding the procedure were encouraged and answered. The patient understands and consents to the procedure.  A planning axial CT scan was performed. Multiple repositionings were required before a suitable window could be found. An appropriate skin entry site was selected and marked.  The left lower quadrant was prepped with Betadinein a sterile fashion, and a sterile drape was applied covering the operative field. A sterile gown and sterile gloves were used for the procedure. Local anesthesia was provided with 1% Lidocaine.  A small dermatotomy was made. Under CT fluoroscopic guidance, a 18 gauge trocar needle was carefully advanced through the iliacus muscle closely adjacent to the inferior left ilium and into the the fluid and gas collection in the left pelvic sidewall. Care was taken to steady medial to the adjacent iliac vessels. A wire was then coiled in the fluid and gas collection. The tract was serially dilated to 58 [place] and a [place] all-purpose drainage catheter was formed within the collection. Approximately 15 mL of purulent and bloody material was successfully aspirated.  The catheter was secured to the skin with 0 silk suture and connected to JP bulb suction. The patient tolerated the procedure well; there was no immediate complication.   COMPLICATIONS: None  FINDINGS: As above  IMPRESSION: 1. Technically successful placement of a 10 French percutaneous drainage catheter into the left pelvic sidewall abscess collection with aspiration of 15 mL of purulent, bloody fluid. The drain was left to JP bulb suction. Recommend repeat CT scan and/ tube injection under fluoroscopy to exclude fistulous connection to the adjacent sigmoid colon prior to tube removal.  Signed,  Sterling Big, MD  [facility]  [facility]   Electronically Signed   By: Malachy Moan M.D.   On: 06/07/2013 18:50    Anti-infectives: Anti-infectives   Start     Dose/Rate Route Frequency Ordered Stop   06/07/13 1800  cefTRIAXone (ROCEPHIN) 1 g in dextrose 5 % 50 mL IVPB  Status:  Discontinued     1 g 100 mL/hr over 30 Minutes Intravenous Every 24 hours 06/06/13 2134 06/07/13 0341   06/07/13 1600  azithromycin (ZITHROMAX) tablet 500 mg  Status:  Discontinued     500 mg Oral Every 24 hours 06/06/13 2134 06/07/13 0341   06/07/13 1145  oseltamivir (TAMIFLU) capsule 30 mg  30 mg Oral Daily 06/07/13 1133 06/12/13 0959   06/07/13 0600  ceFEPIme (MAXIPIME) 1 g in dextrose 5 % 50 mL IVPB  Status:  Discontinued     1 g 100 mL/hr over 30 Minutes Intravenous 3 times per day 06/07/13 0341 06/07/13 0353   06/07/13 0500  ceFEPIme (MAXIPIME) 1 g in dextrose 5 % 50 mL IVPB     1 g 100 mL/hr over 30 Minutes Intravenous Every 24 hours 06/07/13 0354 06/15/13 0459   06/07/13 0400  vancomycin (VANCOCIN) 1,250 mg in sodium chloride 0.9 % 250 mL IVPB     1,250 mg 166.7 mL/hr over 90 Minutes Intravenous Every 24 hours 06/07/13 0353     06/07/13 0215  metroNIDAZOLE (FLAGYL) IVPB 500 mg     500 mg 100 mL/hr over 60 Minutes Intravenous  Once 06/07/13 0205 06/07/13 0302   06/06/13 2130  cefTRIAXone (ROCEPHIN) 1 g in dextrose 5 % 50 mL IVPB     1 g 100 mL/hr over 30 Minutes Intravenous  Once 06/06/13 2128 06/07/13 0004   06/06/13  2130  azithromycin (ZITHROMAX) 500 mg in dextrose 5 % 250 mL IVPB     500 mg 250 mL/hr over 60 Minutes Intravenous  Once 06/06/13 2128 06/07/13 0122      Assessment/Plan: Diverticulitis with abscess s/p percutaneous drainage.  Also with possible pneumonia.  Continue abx per primary team until WBC normal.  Patient will eventually need tube study to rule out colonic fistula.  May have clear liquids   LOS: 2 days    Julann Mcgilvray K. 06/08/2013

## 2013-06-08 NOTE — Progress Notes (Signed)
Pharmacist Heart Failure Core Measure Documentation  Assessment: Heidi HanlonCynthia A Burch has an EF documented as LV EF: 60% - 65%  on 03/02/2012 by ECHO.  No recent ECHO.  Rationale: Heart failure patients with left ventricular systolic dysfunction (LVSD) and an EF < 40% should be prescribed an angiotensin converting enzyme inhibitor (ACEI) or angiotensin receptor blocker (ARB) at discharge unless a contraindication is documented in the medical record.  This patient is not currently on an ACEI or ARB for HF.  This note is being placed in the record in order to provide documentation that a contraindication to the use of these agents is present for this encounter.  ACE Inhibitor or Angiotensin Receptor Blocker is contraindicated (specify all that apply)  []   ACEI allergy AND ARB allergy []   Angioedema []   Moderate or severe aortic stenosis []   Hyperkalemia []   Hypotension []   Renal artery stenosis [x]   Worsening renal function, preexisting renal disease or dysfunction  Anabel BeneSendra Johnathon Mittal, PharmD Clinical Pharmacist Resident Pager: [phone]   Anabel BeneYang, Newel Oien 06/08/2013 2:00 PM

## 2013-06-08 NOTE — Progress Notes (Signed)
Patient had 10 beats of V-Tach, asymptomatic, B/P 127/83, Heidi Billingsallahan, NP notified, new orders given, will continue to monitor

## 2013-06-09 DIAGNOSIS — J438 Other emphysema: Secondary | ICD-10-CM

## 2013-06-09 LAB — CBC
HEMATOCRIT: 34.3 % — AB (ref 36.0–46.0)
Hemoglobin: 10.8 g/dL — ABNORMAL LOW (ref 12.0–15.0)
MCH: 28.8 pg (ref 26.0–34.0)
MCHC: 31.5 g/dL (ref 30.0–36.0)
MCV: 91.5 fL (ref 78.0–100.0)
Platelets: 197 10*3/uL (ref 150–400)
RBC: 3.75 MIL/uL — ABNORMAL LOW (ref 3.87–5.11)
RDW: 15.3 % (ref 11.5–15.5)
WBC: 6.3 10*3/uL (ref 4.0–10.5)

## 2013-06-09 LAB — BASIC METABOLIC PANEL
BUN: 37 mg/dL — ABNORMAL HIGH (ref 6–23)
CO2: 23 meq/L (ref 19–32)
Calcium: 8.6 mg/dL (ref 8.4–10.5)
Chloride: 106 mEq/L (ref 96–112)
Creatinine, Ser: 1.34 mg/dL — ABNORMAL HIGH (ref 0.50–1.10)
GFR calc Af Amer: 42 mL/min — ABNORMAL LOW (ref >90)
GFR, EST NON AFRICAN AMERICAN: 36 mL/min — AB (ref >90)
Glucose, Bld: 82 mg/dL (ref 70–99)
POTASSIUM: 5 meq/L (ref 3.7–5.3)
SODIUM: 145 meq/L (ref 137–147)

## 2013-06-09 LAB — GLUCOSE, CAPILLARY
GLUCOSE-CAPILLARY: 82 mg/dL (ref 70–99)
GLUCOSE-CAPILLARY: 88 mg/dL (ref 70–99)
Glucose-Capillary: 159 mg/dL — ABNORMAL HIGH (ref 70–99)
Glucose-Capillary: 133 mg/dL — ABNORMAL HIGH (ref 70–99)

## 2013-06-09 LAB — MAGNESIUM: MAGNESIUM: 2 mg/dL (ref 1.5–2.5)

## 2013-06-09 NOTE — Progress Notes (Signed)
[facility] PROGRESS NOTE  [name] ZOX:[id number] DOB: 1930/09/17 DOA: 06/06/2013 PCP: Shan Levans, MD  Assessment/Plan: 1-toxic encephalopathy: from diverticular abscess, PNA and influenza. -continue IV abx's, supportive care and tamiflu -PRN antiemetics and pain meds -patient AAOX3; afebrile. -status post drain by IR; tolerating clear liquid diet and passing flatus. -Reports abdominal pain has improved after placement of drain  2-HCAP/influenza: will continue treatment with vanc and cefepime; continue tamiflu. -continue supportive care -continue oxygen supplementation, pulmicort, flutter valve and PRN nebulizers -Breathing a lot better  3-Atrial fibrillation: rate controlled. -will continue heparin per pharmacy; holding coumadin in case patient needs surgery  4-COPD with chronic resp failure: stable and actively no wheezing -will continue home inhalers -chronic prednisone and oxygen supplementation  5-CKD: stable and at baseline. Will monitor -Cr 1.7 and by GFR essentially at baseline  6-hypothyroidism: continue synthroid  7-chronic diastolic heart failure: compensated at this moment. -will check daily weights and strict I's and O's. -Last EF 60-65%  8-diverticular abscess: Status post drain placement by IR; pain improving and patient tolerating clear liquid diet and passing flatus. -Afebrile -WBCs within normal limits -Continue IV antibiotics as per recommendation from surgery -Drain care per IR recommendations  DVT: on heparin  Code Status: Full Family Communication: no family at bedside  Disposition Plan: to be determine   Consultants:  General surgery  IR   Procedures:  See below for x-ray reports  Abscess drain placement by IR (2/27)  Antibiotics:  vanc and cefepime   HPI/Subjective: Afebrile; alert, awake and oriented x3; reports improvement in her abdominal pain. Tolerating clear liquid diet and asking for solid  food.  Objective: Filed Vitals:   06/09/13 1421  BP: 129/59  Pulse: 70  Temp: 98.1 F (36.7 C)  Resp: 20    Intake/Output Summary (Last 24 hours) at 06/09/13 1458 Last data filed at 06/09/13 1317  Gross per 24 hour  Intake 2938.33 ml  Output    750 ml  Net 2188.33 ml   Filed Weights   06/07/13 0322 06/08/13 0554 06/09/13 0529  Weight: 83.3 kg (183 lb 10.3 oz) 83.3 kg (183 lb 10.3 oz) 82.8 kg (182 lb 8.7 oz)    Exam:   General:  AAOX3, No fever currently  Cardiovascular: irregular, no rubs or gallops  Respiratory: no wheezing; positive rhonchi (but improving)   Abdomen: LLQ tenderness (present with deep palpation, but improving), positive BS, no guarding, no distension  Musculoskeletal: no joint swelling.  Data Reviewed: Basic Metabolic Panel:  Recent Labs Lab 06/06/13 2244 06/06/13 2333 06/07/13 0426 06/08/13 2341 06/09/13 0457  NA 132* 135* 137  --  145  K 4.6 4.6 4.3  --  5.0  CL 89* 95* 97  --  106  CO2 25  --  25  --  23  GLUCOSE 194* 198* 160*  --  82  BUN 62* 77* 57*  --  37*  CREATININE 2.02* 2.10* 1.78*  --  1.34*  CALCIUM 8.6  --  8.3*  --  8.6  MG  --   --   --  2.0  --    Liver Function Tests:  Recent Labs Lab 06/06/13 2244 06/07/13 0426  AST 48* 32  ALT 49* 38*  ALKPHOS 230* 201*  BILITOT 0.3 0.2*  PROT 6.7 6.2  ALBUMIN 2.7* 2.4*    Recent Labs Lab 06/06/13 2244  LIPASE 20   CBC:  Recent Labs Lab 06/06/13 2244 06/06/13 2333 06/07/13 0426 06/09/13 0457  WBC 11.2*  --  8.6 6.3  NEUTROABS 9.6*  --  6.7  --   HGB 11.4* 12.9 10.9* 10.8*  HCT 36.6 38.0 34.9* 34.3*  MCV 91.3  --  91.1 91.5  PLT 231  --  202 197   BNP (last 3 results)  Recent Labs  07/25/12 0822 12/05/12 1558 06/06/13 2244  PROBNP 399.0* 277.0* 1764.0*   CBG:  Recent Labs Lab 06/08/13 1125 06/08/13 1611 06/08/13 2144 06/09/13 0633 06/09/13 1114  GLUCAP 165* 153* 122* 82 159*    Recent Results (from the past 240 hour(s))  URINE CULTURE      Status: None   Collection Time    06/06/13  8:25 PM      Result Value Ref Range Status   Specimen Description URINE, CATHETERIZED   Final   Special Requests ADDED [id number]   Final   Culture  Setup Time     Final   Value: 06/07/2013 01:19     Performed at [facility]   Colony Count     Final   Value: NO GROWTH     Performed at [facility]   Culture     Final   Value: NO GROWTH     Performed at [facility]   Report Status 06/07/2013 FINAL   Final  CULTURE, BLOOD (ROUTINE X 2)     Status: None   Collection Time    06/06/13 11:20 PM      Result Value Ref Range Status   Specimen Description BLOOD RIGHT WRIST   Final   Special Requests BOTTLES DRAWN AEROBIC AND ANAEROBIC 3CC   Final   Culture  Setup Time     Final   Value: 06/07/2013 03:27     Performed at [facility]   Culture     Final   Value:        BLOOD CULTURE RECEIVED NO GROWTH TO DATE CULTURE WILL BE HELD FOR 5 DAYS BEFORE ISSUING A FINAL NEGATIVE REPORT     Performed at [facility]   Report Status PENDING   Incomplete  CULTURE, BLOOD (ROUTINE X 2)     Status: None   Collection Time    06/06/13 11:30 PM      Result Value Ref Range Status   Specimen Description BLOOD RIGHT HAND   Final   Special Requests BOTTLES DRAWN AEROBIC AND ANAEROBIC 5CC   Final   Culture  Setup Time     Final   Value: 06/07/2013 03:27     Performed at [facility]   Culture     Final   Value:        BLOOD CULTURE RECEIVED NO GROWTH TO DATE CULTURE WILL BE HELD FOR 5 DAYS BEFORE ISSUING A FINAL NEGATIVE REPORT     Performed at [facility]   Report Status PENDING   Incomplete  MRSA PCR SCREENING     Status: None   Collection Time    06/07/13  3:23 AM      Result Value Ref Range Status   MRSA by PCR NEGATIVE  NEGATIVE Final   Comment:            The GeneXpert MRSA Assay (FDA     approved for NASAL specimens     only), is one component of a     comprehensive MRSA colonization      surveillance program. It is not     intended to diagnose MRSA  infection nor to guide or     monitor treatment for     MRSA infections.  CULTURE, ROUTINE-ABSCESS     Status: None   Collection Time    06/07/13  6:48 PM      Result Value Ref Range Status   Specimen Description PERITONEAL CAVITY   Final   Special Requests DIVERTICULAR ABSCESS   Final   Gram Stain     Final   Value: MODERATE WBC PRESENT,BOTH PMN AND MONONUCLEAR     NO SQUAMOUS EPITHELIAL CELLS SEEN     RARE GRAM POSITIVE COCCI     RARE GRAM NEGATIVE RODS     RARE GRAM POSITIVE RODS   Culture     Final   Value: Culture reincubated for better growth     Performed at [facility]   Report Status PENDING   Incomplete     Studies: Ct Image Guided Drainage By Percutaneous Catheter  06/07/2013   CLINICAL DATA:  80 year old female with a diverticular abscess, leukocytosis and fever. She is previously treated with antibiotics unsuccessfully. The abscess cavity has enlarged and now extends to the left pelvic sidewall. The percutaneous window is very small. CT guided drain placement will be attempted.  EXAM: CT GUIDED DRAINAGE OF deep pelvic diverticular ABSCESS  ANESTHESIA/SEDATION: 1 Mg IV Versed 50 mcg IV Fentanyl  Total Moderate Sedation Time:  25 minutes  PROCEDURE: The procedure, risks, benefits, and alternatives were explained to the patient. Questions regarding the procedure were encouraged and answered. The patient understands and consents to the procedure.  A planning axial CT scan was performed. Multiple repositionings were required before a suitable window could be found. An appropriate skin entry site was selected and marked.  The left lower quadrant was prepped with Betadinein a sterile fashion, and a sterile drape was applied covering the operative field. A sterile gown and sterile gloves were used for the procedure. Local anesthesia was provided with 1% Lidocaine.  A small dermatotomy was made. Under CT  fluoroscopic guidance, a 18 gauge trocar needle was carefully advanced through the iliacus muscle closely adjacent to the inferior left ilium and into the the fluid and gas collection in the left pelvic sidewall. Care was taken to steady medial to the adjacent iliac vessels. A wire was then coiled in the fluid and gas collection. The tract was serially dilated to 40 [place] and a [place] all-purpose drainage catheter was formed within the collection. Approximately 15 mL of purulent and bloody material was successfully aspirated.  The catheter was secured to the skin with 0 silk suture and connected to JP bulb suction. The patient tolerated the procedure well; there was no immediate complication.  COMPLICATIONS: None  FINDINGS: As above  IMPRESSION: 1. Technically successful placement of a 10 French percutaneous drainage catheter into the left pelvic sidewall abscess collection with aspiration of 15 mL of purulent, bloody fluid. The drain was left to JP bulb suction. Recommend repeat CT scan and/ tube injection under fluoroscopy to exclude fistulous connection to the adjacent sigmoid colon prior to tube removal.  Signed,  Sterling Big, MD  [facility]  [facility]   Electronically Signed   By: Malachy Moan M.D.   On: 06/07/2013 18:50    Scheduled Meds: . albuterol  2.5 mg Nebulization BID  . bisoprolol  5 mg Oral Daily  . budesonide (PULMICORT) nebulizer solution  0.25 mg Nebulization BID  . ceFEPime (MAXIPIME) IV  1 g Intravenous  Q24H  . guaiFENesin  600 mg Oral BID  . heparin  5,000 Units Subcutaneous 3 times per day  . levothyroxine  50 mcg Oral QAC breakfast  . oseltamivir  30 mg Oral Daily  . predniSONE  10 mg Oral Q breakfast  . tiotropium  18 mcg Inhalation Daily  . vancomycin  1,250 mg Intravenous Q24H  . Warfarin - Pharmacist Dosing Inpatient   Does not apply q1800   Continuous Infusions: . dextrose 5 % and 0.45% NaCl 50 mL/hr  at 06/08/13 1914    Principal Problem:   Colonic diverticular abscess Active Problems:   HYPOTHYROIDISM   Atrial fibrillation   DIASTOLIC HEART FAILURE, CHRONIC   COPD with emphysema   Osteoarthritis   Oxygen dependent   Chronic gout due to renal impairment   Chronic kidney disease (CKD), stage IV     Time spent: >30 minutes    Blanche Gallien  [facility] Pager [phone]. If 7PM-7AM, please contact night-coverage at [email or web address], password [facility] 06/09/2013, 2:58 PM  LOS: 3 days

## 2013-06-09 NOTE — Progress Notes (Signed)
Subjective: Reports abdominal pain much less after drain Passing flatus  Objective: Vital signs in last 24 hours: Temp:  [97.3 F (36.3 C)-97.9 F (36.6 C)] 97.3 F (36.3 C) (03/01 0529) Pulse Rate:  [74-80] 74 (03/01 0529) Resp:  [20] 20 (03/01 0529) BP: (126-138)/(63-83) 126/63 mmHg (03/01 0529) SpO2:  [97 %-100 %] 97 % (03/01 0529) Weight:  [182 lb 8.7 oz (82.8 kg)] 182 lb 8.7 oz (82.8 kg) (03/01 0529) Last BM Date: 06/06/13  Intake/Output from previous day: 02/28 0701 - 03/01 0700 In: 2368.3 [P.O.:775; I.V.:1183.3; IV Piggyback:350] Out: 650 [Urine:650] Intake/Output this shift:    Looks comfortable Abdomen soft, minimally tender with minimal guarding in LLQ Drain serosang  Lab Results:   Recent Labs  06/07/13 0426 06/09/13 0457  WBC 8.6 6.3  HGB 10.9* 10.8*  HCT 34.9* 34.3*  PLT 202 197   BMET  Recent Labs  06/07/13 0426 06/09/13 0457  NA 137 145  K 4.3 5.0  CL 97 106  CO2 25 23  GLUCOSE 160* 82  BUN 57* 37*  CREATININE 1.78* 1.34*  CALCIUM 8.3* 8.6   PT/INR  Recent Labs  06/06/13 2244 06/08/13 0420  LABPROT 15.4* 15.2  INR 1.25 1.23   ABG No results found for this basename: PHART, PCO2, PO2, HCO3,  in the last 72 hours  Studies/Results: Ct Image Guided Drainage By Percutaneous Catheter  06/07/2013   CLINICAL DATA:  80 year old female with a diverticular abscess, leukocytosis and fever. She is previously treated with antibiotics unsuccessfully. The abscess cavity has enlarged and now extends to the left pelvic sidewall. The percutaneous window is very small. CT guided drain placement will be attempted.  EXAM: CT GUIDED DRAINAGE OF deep pelvic diverticular ABSCESS  ANESTHESIA/SEDATION: 1 Mg IV Versed 50 mcg IV Fentanyl  Total Moderate Sedation Time:  25 minutes  PROCEDURE: The procedure, risks, benefits, and alternatives were explained to the patient. Questions regarding the procedure were encouraged and answered. The patient understands and  consents to the procedure.  A planning axial CT scan was performed. Multiple repositionings were required before a suitable window could be found. An appropriate skin entry site was selected and marked.  The left lower quadrant was prepped with Betadinein a sterile fashion, and a sterile drape was applied covering the operative field. A sterile gown and sterile gloves were used for the procedure. Local anesthesia was provided with 1% Lidocaine.  A small dermatotomy was made. Under CT fluoroscopic guidance, a 18 gauge trocar needle was carefully advanced through the iliacus muscle closely adjacent to the inferior left ilium and into the the fluid and gas collection in the left pelvic sidewall. Care was taken to steady medial to the adjacent iliac vessels. A wire was then coiled in the fluid and gas collection. The tract was serially dilated to 4612 [place] and a [place] 10 French all-purpose drainage catheter was formed within the collection. Approximately 15 mL of purulent and bloody material was successfully aspirated.  The catheter was secured to the skin with 0 silk suture and connected to JP bulb suction. The patient tolerated the procedure well; there was no immediate complication.  COMPLICATIONS: None  FINDINGS: As above  IMPRESSION: 1. Technically successful placement of a 10 French percutaneous drainage catheter into the left pelvic sidewall abscess collection with aspiration of 15 mL of purulent, bloody fluid. The drain was left to JP bulb suction. Recommend repeat CT scan and/ tube injection under fluoroscopy to exclude fistulous connection to the adjacent sigmoid colon prior  to tube removal.  Signed,  Sterling Big, MD  [facility]  [facility]   Electronically Signed   By: Malachy Moan M.D.   On: 06/07/2013 18:50    Anti-infectives: Anti-infectives   Start     Dose/Rate Route Frequency Ordered Stop   06/07/13 1800  cefTRIAXone (ROCEPHIN) 1 g in  dextrose 5 % 50 mL IVPB  Status:  Discontinued     1 g 100 mL/hr over 30 Minutes Intravenous Every 24 hours 06/06/13 2134 06/07/13 0341   06/07/13 1600  azithromycin (ZITHROMAX) tablet 500 mg  Status:  Discontinued     500 mg Oral Every 24 hours 06/06/13 2134 06/07/13 0341   06/07/13 1145  oseltamivir (TAMIFLU) capsule 30 mg     30 mg Oral Daily 06/07/13 1133 06/12/13 0959   06/07/13 0600  ceFEPIme (MAXIPIME) 1 g in dextrose 5 % 50 mL IVPB  Status:  Discontinued     1 g 100 mL/hr over 30 Minutes Intravenous 3 times per day 06/07/13 0341 06/07/13 0353   06/07/13 0500  ceFEPIme (MAXIPIME) 1 g in dextrose 5 % 50 mL IVPB     1 g 100 mL/hr over 30 Minutes Intravenous Every 24 hours 06/07/13 0354 06/15/13 0459   06/07/13 0400  vancomycin (VANCOCIN) 1,250 mg in sodium chloride 0.9 % 250 mL IVPB     1,250 mg 166.7 mL/hr over 90 Minutes Intravenous Every 24 hours 06/07/13 0353     06/07/13 0215  metroNIDAZOLE (FLAGYL) IVPB 500 mg     500 mg 100 mL/hr over 60 Minutes Intravenous  Once 06/07/13 0205 06/07/13 0302   06/06/13 2130  cefTRIAXone (ROCEPHIN) 1 g in dextrose 5 % 50 mL IVPB     1 g 100 mL/hr over 30 Minutes Intravenous  Once 06/06/13 2128 06/07/13 0004   06/06/13 2130  azithromycin (ZITHROMAX) 500 mg in dextrose 5 % 250 mL IVPB     500 mg 250 mL/hr over 60 Minutes Intravenous  Once 06/06/13 2128 06/07/13 0122      Assessment/Plan: s/p * No surgery found *  Continue IV antibiotics and perc drain for diverticular abscess  LOS: 3 days    Hajar Penninger A 06/09/2013

## 2013-06-09 NOTE — Progress Notes (Signed)
Patient alert and oriented x4 throughout shift.  Vital signs stable.  Denies pain and shortness of breath this afternoon.  Medicated with PRN tramadol this morning for pain; per patient, "it worked well".  JP drain irrigated with 5mL NS and documented per order.  Denies any questions or concerns at this time.  Will continue to monitor.

## 2013-06-09 NOTE — Progress Notes (Signed)
Subjective: Pt ok, no new c/o. Sitting up in bed drinking clears. Some pain at drain site  Objective: Physical Exam: BP 126/63  Pulse 74  Temp(Src) 97.3 F (36.3 C) (Oral)  Resp 20  Ht 5\' 3"  (1.6 m)  Wt 182 lb 8.7 oz (82.8 kg)  BMI 32.34 kg/m2  SpO2 97% LLQ drain intact, site clean, mildly tender Output purulent   Labs: CBC  Recent Labs  06/07/13 0426 06/09/13 0457  WBC 8.6 6.3  HGB 10.9* 10.8*  HCT 34.9* 34.3*  PLT 202 197   BMET  Recent Labs  06/07/13 0426 06/09/13 0457  NA 137 145  K 4.3 5.0  CL 97 106  CO2 25 23  GLUCOSE 160* 82  BUN 57* 37*  CREATININE 1.78* 1.34*  CALCIUM 8.3* 8.6   LFT  Recent Labs  06/06/13 2244 06/07/13 0426  PROT 6.7 6.2  ALBUMIN 2.7* 2.4*  AST 48* 32  ALT 49* 38*  ALKPHOS 230* 201*  BILITOT 0.3 0.2*  LIPASE 20  --    PT/INR  Recent Labs  06/06/13 2244 06/08/13 0420  LABPROT 15.4* 15.2  INR 1.25 1.23     Studies/Results: Ct Image Guided Drainage By Percutaneous Catheter  06/07/2013   CLINICAL DATA:  80 year old female with a diverticular abscess, leukocytosis and fever. She is previously treated with antibiotics unsuccessfully. The abscess cavity has enlarged and now extends to the left pelvic sidewall. The percutaneous window is very small. CT guided drain placement will be attempted.  EXAM: CT GUIDED DRAINAGE OF deep pelvic diverticular ABSCESS  ANESTHESIA/SEDATION: 1 Mg IV Versed 50 mcg IV Fentanyl  Total Moderate Sedation Time:  25 minutes  PROCEDURE: The procedure, risks, benefits, and alternatives were explained to the patient. Questions regarding the procedure were encouraged and answered. The patient understands and consents to the procedure.  A planning axial CT scan was performed. Multiple repositionings were required before a suitable window could be found. An appropriate skin entry site was selected and marked.  The left lower quadrant was prepped with Betadinein a sterile fashion, and a sterile drape was  applied covering the operative field. A sterile gown and sterile gloves were used for the procedure. Local anesthesia was provided with 1% Lidocaine.  A small dermatotomy was made. Under CT fluoroscopic guidance, a 18 gauge trocar needle was carefully advanced through the iliacus muscle closely adjacent to the inferior left ilium and into the the fluid and gas collection in the left pelvic sidewall. Care was taken to steady medial to the adjacent iliac vessels. A wire was then coiled in the fluid and gas collection. The tract was serially dilated to 412 [place] and a [place] 10 French all-purpose drainage catheter was formed within the collection. Approximately 15 mL of purulent and bloody material was successfully aspirated.  The catheter was secured to the skin with 0 silk suture and connected to JP bulb suction. The patient tolerated the procedure well; there was no immediate complication.  COMPLICATIONS: None  FINDINGS: As above  IMPRESSION: 1. Technically successful placement of a 10 French percutaneous drainage catheter into the left pelvic sidewall abscess collection with aspiration of 15 mL of purulent, bloody fluid. The drain was left to JP bulb suction. Recommend repeat CT scan and/ tube injection under fluoroscopy to exclude fistulous connection to the adjacent sigmoid colon prior to tube removal.  Signed,  Sterling BigHeath K. McCullough, MD  [facility]  [facility]   Electronically Signed   By: Vilma PraderHeath  Archer Asa M.D.   On: 06/07/2013 18:50    Assessment/Plan: LLQ diverticulitis with abscess S/p perc drain 2/27 Stressed importance of TID flushes to keep drain functioning properly. Also need to keep drain from kinking as it is in a more lateral position than most drains and has a tendency to fall under pt leg/hip Other plans per Primary and CCS    LOS: 3 days    Brayton El PA-C 06/09/2013 8:48 AM

## 2013-06-10 DIAGNOSIS — S91009A Unspecified open wound, unspecified ankle, initial encounter: Secondary | ICD-10-CM

## 2013-06-10 DIAGNOSIS — S81009A Unspecified open wound, unspecified knee, initial encounter: Secondary | ICD-10-CM

## 2013-06-10 DIAGNOSIS — S81809A Unspecified open wound, unspecified lower leg, initial encounter: Secondary | ICD-10-CM

## 2013-06-10 LAB — GLUCOSE, CAPILLARY
GLUCOSE-CAPILLARY: 133 mg/dL — AB (ref 70–99)
GLUCOSE-CAPILLARY: 275 mg/dL — AB (ref 70–99)
Glucose-Capillary: 132 mg/dL — ABNORMAL HIGH (ref 70–99)

## 2013-06-10 LAB — VANCOMYCIN, TROUGH: VANCOMYCIN TR: 32.5 ug/mL — AB (ref 10.0–20.0)

## 2013-06-10 LAB — VANCOMYCIN, RANDOM: VANCOMYCIN RM: 39 ug/mL

## 2013-06-10 MED ORDER — COLLAGENASE 250 UNIT/GM EX OINT
TOPICAL_OINTMENT | Freq: Every day | CUTANEOUS | Status: DC
  Administered 2013-06-10: 10:00:00 via TOPICAL
  Filled 2013-06-10: qty 30

## 2013-06-10 MED ORDER — ALBUTEROL SULFATE (2.5 MG/3ML) 0.083% IN NEBU
2.5000 mg | INHALATION_SOLUTION | RESPIRATORY_TRACT | Status: DC | PRN
  Administered 2013-06-10: 2.5 mg via RESPIRATORY_TRACT
  Filled 2013-06-10: qty 3

## 2013-06-10 MED ORDER — TORSEMIDE 20 MG PO TABS
20.0000 mg | ORAL_TABLET | Freq: Every day | ORAL | Status: DC
  Administered 2013-06-10 – 2013-06-14 (×5): 20 mg via ORAL
  Filled 2013-06-10 (×6): qty 1

## 2013-06-10 MED ORDER — ACETAMINOPHEN 325 MG PO TABS
650.0000 mg | ORAL_TABLET | Freq: Once | ORAL | Status: AC
  Administered 2013-06-10: 650 mg via ORAL
  Filled 2013-06-10: qty 2

## 2013-06-10 NOTE — Progress Notes (Signed)
Patient interviewed and examined, agree with PA note above. Still with some fever and tahycardia but generally stable/improved.  Would be a very poor operative risk [name] MD, FACS  06/10/2013 9:57 AM

## 2013-06-10 NOTE — Progress Notes (Signed)
ANTIBIOTIC CONSULT NOTE - FOLLOW UP  Pharmacy Consult for Vancomycin Indication: pneumonia coverage  Allergies  Allergen Reactions  . Sulfonamide Derivatives Other (See Comments)    Childhood reaction    Patient Measurements: Height: 5\' 3"  (160 cm) Weight: 186 lb 11.7 oz (84.7 kg) IBW/kg (Calculated) : 52.4  Vital Signs: Temp: 97.4 F (36.3 C) (03/02 2017) Temp src: Oral (03/02 2017) BP: 130/79 mmHg (03/02 2017) Pulse Rate: 76 (03/02 2017) Intake/Output from previous day: 03/01 0701 - 03/02 0700 In: 2728.3 [P.O.:1440; I.V.:1228.3; IV Piggyback:50] Out: 1530 [Urine:1500; Drains:30]  Labs:  Recent Labs  06/09/13 0457  WBC 6.3  HGB 10.8*  PLT 197  CREATININE 1.34*   Estimated Creatinine Clearance: 34.5 ml/min (by C-G formula based on Cr of 1.34).  Recent Labs  06/10/13 0320 06/10/13 1620  VANCOTROUGH 32.5*  --   VANCORANDOM  --  39.0    Assessment:   Vancomycin trough level was above goal early this morning (32.5 mcg/ml), and standing regimen of 1250 mg IV q24hrs was cancelled. Dose was given at 3:30am.  A random vancomycin level was drawn ~ 12 hrs after the morning dose, as it was thought that the trough level may have been drawn while the drug was infusing.  Random level remains elevated at 39 mcg/ml. Trough level this am is possibly a true trough.  Estimated peak level ~50 mcg/ml, so she is clearing some drug, but estimated half-life is ~30-35 hours.  Continues on Cefepime 1 gram IV q24hrs (day #4) and Tamiflu 30 mg daily (day # 4 of 5).   Demadex resumed today.  [facility]  Goal of Therapy:  Vancomycin trough level 15-20 mcg/ml  Plan:   Continue to hold Vancomycin.  Bmet in am; follow renal function.  Will plan to recheck random Vancomycin level on 3/4, about 48hrs after last dose.  Dennie Fettersgan, Nellie Chevalier Donovan, [place] Pager: [phone] 06/10/2013,8:39 PM

## 2013-06-10 NOTE — Progress Notes (Signed)
Vanc trough result 32.5, RN unsure whether lab was drawn prior to hanging bag; lab time is 0320 and hung 0330 but RN believes the bag had already been hung when lab drew.  Will d/c vanc for now and recheck in 12hr given unclear lab.  Vernard GamblesVeronda Azariah [place]resse, PharmD, BCPS 06/10/2013 5:12 AM

## 2013-06-10 NOTE — Progress Notes (Signed)
Temp 101 rectally, Claiborne Billingsallahan notified, new order given, will continue to monitor

## 2013-06-10 NOTE — Progress Notes (Signed)
When going to flush pts drain, bulb of drain was not there.  The bulb is not anywhere in the room and the pt states she does not know what happened to it.  MD notified and radiology called to assess.  Upon assessment it was noted that pt will need a new drain.  MD notified and order placed.

## 2013-06-10 NOTE — Progress Notes (Signed)
Utilization Review Completed Clarabell Matsuoka J. Reyhan Moronta, RN, BSN, NCM [phone]  

## 2013-06-10 NOTE — Progress Notes (Signed)
Subjective: Pt alert and oriented x 3 and in NAD.  Pt states her abdominal pain is better since the drain was placed.  Pt states they flushed the drain with a small amount of fluid yesterday and it was "excruciating".  She states she has been having pain radiating down her left leg and hasn't been up to walk since she has been in the hospital. She denies flatus or bowel movements. Pt has a wound on her left lower leg that she states she had from a fall and subsequent skin tear at christmas.  She states she had been getting wound care from the wound center and putting santyl on it.  Objective: Vital signs in last 24 hours: Temp:  [98.1 F (36.7 C)-101.8 F (38.8 C)] 101.8 F (38.8 C) (03/02 0635) Pulse Rate:  [70-116] 116 (03/02 0623) Resp:  [18-20] 18 (03/02 0623) BP: (115-133)/(56-86) 115/70 mmHg (03/02 0623) SpO2:  [96 %-97 %] 96 % (03/02 0623) Weight:  [186 lb 11.7 oz (84.7 kg)] 186 lb 11.7 oz (84.7 kg) (03/02 0623) Last BM Date: 06/06/13  Intake/Output from previous day: 03/01 0701 - 03/02 0700 In: 2728.3 [P.O.:1440; I.V.:1228.3; IV Piggyback:50] Out: 1530 [Urine:1500; Drains:30] Intake/Output this shift:    PE: Gen:  Alert, NAD, pleasant Card:  RRR, no M/G/R heard Pulm:  CTA, no W/R/R Abd: Obese, Soft. BS + in all quadrants. Tenderness in LUQ.  Exam limited by body habitus. Small amount of brown-feculent fluid in JP drain. 76mL/24 hrs of drainage. Ext: Gross sensation and motor intact. 2+ DP pulses, 2+ radial pulses. Pt has an oblong, 7cm by 3cm wound on her left lower leg.  The wound is non edematous and non erythematous with a small amount of serosanguineous discharge evident on the bed sheet. Wound is non necrotic. Appears to extend to dermis, no fat or bone tissue visible. Brown eschar present  Lab Results:   Recent Labs  06/09/13 0457  WBC 6.3  HGB 10.8*  HCT 34.3*  PLT 197   BMET  Recent Labs  06/09/13 0457  NA 145  K 5.0  CL 106  CO2 23  GLUCOSE 82   BUN 37*  CREATININE 1.34*  CALCIUM 8.6   PT/INR  Recent Labs  06/08/13 0420  LABPROT 15.2  INR 1.23   CMP     Component Value Date/Time   NA 145 06/09/2013 0457   K 5.0 06/09/2013 0457   CL 106 06/09/2013 0457   CO2 23 06/09/2013 0457   GLUCOSE 82 06/09/2013 0457   BUN 37* 06/09/2013 0457   CREATININE 1.34* 06/09/2013 0457   CREATININE 1.90* 03/23/2012 1621   CALCIUM 8.6 06/09/2013 0457   PROT 6.2 06/07/2013 0426   ALBUMIN 2.4* 06/07/2013 0426   AST 32 06/07/2013 0426   ALT 38* 06/07/2013 0426   ALKPHOS 201* 06/07/2013 0426   BILITOT 0.2* 06/07/2013 0426   GFRNONAA 36* 06/09/2013 0457   GFRAA 42* 06/09/2013 0457   Lipase     Component Value Date/Time   LIPASE 20 06/06/2013 2244       Studies/Results: No results found.  Anti-infectives: Anti-infectives   Start     Dose/Rate Route Frequency Ordered Stop   06/07/13 1800  cefTRIAXone (ROCEPHIN) 1 g in dextrose 5 % 50 mL IVPB  Status:  Discontinued     1 g 100 mL/hr over 30 Minutes Intravenous Every 24 hours 06/06/13 2134 06/07/13 0341   06/07/13 1600  azithromycin (ZITHROMAX) tablet 500 mg  Status:  Discontinued  500 mg Oral Every 24 hours 06/06/13 2134 06/07/13 0341   06/07/13 1145  oseltamivir (TAMIFLU) capsule 30 mg     30 mg Oral Daily 06/07/13 1133 06/12/13 0959   06/07/13 0600  ceFEPIme (MAXIPIME) 1 g in dextrose 5 % 50 mL IVPB  Status:  Discontinued     1 g 100 mL/hr over 30 Minutes Intravenous 3 times per day 06/07/13 0341 06/07/13 0353   06/07/13 0500  ceFEPIme (MAXIPIME) 1 g in dextrose 5 % 50 mL IVPB     1 g 100 mL/hr over 30 Minutes Intravenous Every 24 hours 06/07/13 0354 06/15/13 0459   06/07/13 0400  vancomycin (VANCOCIN) 1,250 mg in sodium chloride 0.9 % 250 mL IVPB  Status:  Discontinued     1,250 mg 166.7 mL/hr over 90 Minutes Intravenous Every 24 hours 06/07/13 0353 06/10/13 0508   06/07/13 0215  metroNIDAZOLE (FLAGYL) IVPB 500 mg     500 mg 100 mL/hr over 60 Minutes Intravenous  Once 06/07/13 0205 06/07/13  0302   06/06/13 2130  cefTRIAXone (ROCEPHIN) 1 g in dextrose 5 % 50 mL IVPB     1 g 100 mL/hr over 30 Minutes Intravenous  Once 06/06/13 2128 06/07/13 0004   06/06/13 2130  azithromycin (ZITHROMAX) 500 mg in dextrose 5 % 250 mL IVPB     500 mg 250 mL/hr over 60 Minutes Intravenous  Once 06/06/13 2128 06/07/13 0122     Antibiotics Azithromycin: 06/06/13 >> 06/07/13 Ceftriaxone: 06/06/13 >> 06/07/13 Flagyl: 06/07/13 Cefepime 06/07/13 >>   Day 5 of 8 Vancomycin 06/07/13 >>  On hold for 12 hrs as of 0512 on 06/10/13 per pharm.   Assessment/Plan Diverticulitis with Abscess: Drain placed on 06/07/13 with symptom relief. Repeat CT towards end of week HCAP/Influenza: On Vanc, Cefipime, and Tamiflu.  Afib: Rate controlled, managed my Internal Medicine COPD: Breathing treatments prn.  IS not in room, RT to bring and educate pt. Multiple Comorbidities: Continue home meds per Internal Medicine VTE prophylaxis: On Heparin. Leg Wound: Delayed healing, recommend wound care. Does not appear to need surgical debridement. Will order WOC consult and Santyl.    LOS: 4 days    Casimiro NeedleMichael A. Dion Saucierillery, PA-S [employer] 06/10/2013, 7:57 AM [facility] Phone #: [phone]

## 2013-06-10 NOTE — Consult Note (Signed)
WOC wound consult note Reason for Consult: Consult requested for left leg wound.  Daughter at bedside states this is a chronic wound which began as a skin tear in December. Wound type: Full thickness to left outer calf Measurement:3X1X.2cm Wound bed: Dry scabbed skin surrounding and covering wound removes easily with warm water.  Wound bed revealed is 10% yellow, 90% red Drainage (amount, consistency, odor) no odor, small amt yellow drainage Periwound: pink dry scar tissue surrounding wound bed Dressing procedure/placement/frequency: Pt has been using Santyl to chemically debride nonviable tissue prior to admission, according to daughter.  Wound has improved at this time and this is no longer necessary. Foam dressing to protect and promote healing.  Daughter at bedside well-informed regarding topical treatment. Please re-consult if further assistance is needed.  Thank-you,  Cammie Mcgeeawn Kadedra Vanaken MSN, RN, CWOCN, [place]CN-AP, CNS [phone]

## 2013-06-10 NOTE — Progress Notes (Signed)
Pt apparently pulled out drainage catheter this afternoon. IR requested to replace. Have made pt NPO p MN Hold am Heparin Will reassess in am and discuss new drain placement.  Brayton ElKevin Kaelum Kissick PA-C Interventional Radiology 06/10/2013 5:13 PM

## 2013-06-10 NOTE — Progress Notes (Signed)
[facility] PROGRESS NOTE  Nolberto HanlonCynthia A Pugsley WUJ:[id number]:[id number] DOB: 03/08/1931 DOA: 06/06/2013 PCP: Shan LevansPatrick Wright, MD  Assessment/Plan: 1-toxic encephalopathy: from diverticular abscess, PNA and influenza. -continue IV abx's, supportive care and tamiflu -PRN antiemetics and pain meds -patient AAOX3; spike fever overnight -tolerating clear liquid diet and passing flatus. -Reports abdominal pain has improved; has now lost drain and will need to be replace it by IR.  2-HCAP/influenza: will continue treatment with vanc and cefepime; continue tamiflu. -continue supportive care -continue oxygen supplementation, pulmicort, flutter valve and PRN nebulizers -Breathing overall stable; except for mild SOB especially when laying flat -resume demadex  3-Atrial fibrillation: rate controlled. -will continue heparin per pharmacy; holding coumadin in case patient needs surgery.  4-COPD with chronic resp failure: stable and actively no wheezing -will continue home inhalers -chronic prednisone and oxygen supplementation  5-CKD: stable and at baseline. Will monitor -Cr 1.7 and by GFR essentially at baseline  6-hypothyroidism: continue synthroid  7-chronic diastolic heart failure: compensated at this moment. -continue daily weights and strict I's and O's. -Last EF 60-65% -positive baklance and complaining of SOB -will resume demadex and [facility]  8-diverticular abscess: Status post drain placement by IR; pain improving and patient tolerating clear liquid diet and passing flatus. -spike fever overnight and has lost drain this afternoon -plan is to repeat CT abd and replace drain on 3/03 -WBCs has remained within normal limits -Continue current IV antibiotics as per recommendation from surgery -Drain per IR recommendations -poor candidate for surgery, hopping can be avoided  9-fever: most likely from abscess. -will continue current abx's -check UA to be thorough   DVT: on  heparin  Code Status: Full Family Communication: no family at bedside  Disposition Plan: to be determine   Consultants:  General surgery  IR   Procedures:  See below for x-ray reports  Abscess drain placement by IR (2/27)  Antibiotics:  vanc and cefepime 2/26  HPI/Subjective: AAOX3, positive fever; denies CP. reports mild SOB and has decrease BS at bases. Continue with some abd pain.  Objective: Filed Vitals:   06/10/13 1407  BP: 127/56  Pulse: 77  Temp: 97.5 F (36.4 C)  Resp: 20    Intake/Output Summary (Last 24 hours) at 06/10/13 1734 Last data filed at 06/10/13 1700  Gross per 24 hour  Intake   1755 ml  Output   1315 ml  Net    440 ml   Filed Weights   06/08/13 0554 06/09/13 0529 06/10/13 0623  Weight: 83.3 kg (183 lb 10.3 oz) 82.8 kg (182 lb 8.7 oz) 84.7 kg (186 lb 11.7 oz)    Exam:   General:  AAOX3, spike fever overnight 101.7; patient reports some SOB, positive balance. Reports abd is still painful  Cardiovascular: irregular, no rubs or gallops  Respiratory: no wheezing; no rhonchi; slight decrease BS at bases   Abdomen: LLQ tenderness (present with deep palpation, but improving), positive BS, no guarding, no distension  Musculoskeletal: no joint swelling.  Data Reviewed: Basic Metabolic Panel:  Recent Labs Lab 06/06/13 2244 06/06/13 2333 06/07/13 0426 06/08/13 2341 06/09/13 0457  NA 132* 135* 137  --  145  K 4.6 4.6 4.3  --  5.0  CL 89* 95* 97  --  106  CO2 25  --  25  --  23  GLUCOSE 194* 198* 160*  --  82  BUN 62* 77* 57*  --  37*  CREATININE 2.02* 2.10* 1.78*  --  1.34*  CALCIUM 8.6  --  8.3*  --  8.6  MG  --   --   --  2.0  --    Liver Function Tests:  Recent Labs Lab 06/06/13 2244 06/07/13 0426  AST 48* 32  ALT 49* 38*  ALKPHOS 230* 201*  BILITOT 0.3 0.2*  PROT 6.7 6.2  ALBUMIN 2.7* 2.4*    Recent Labs Lab 06/06/13 2244  LIPASE 20   CBC:  Recent Labs Lab 06/06/13 2244 06/06/13 2333 06/07/13 0426  06/09/13 0457  WBC 11.2*  --  8.6 6.3  NEUTROABS 9.6*  --  6.7  --   HGB 11.4* 12.9 10.9* 10.8*  HCT 36.6 38.0 34.9* 34.3*  MCV 91.3  --  91.1 91.5  PLT 231  --  202 197   BNP (last 3 results)  Recent Labs  07/25/12 0822 12/05/12 1558 06/06/13 2244  PROBNP 399.0* 277.0* 1764.0*   CBG:  Recent Labs Lab 06/09/13 1114 06/09/13 1612 06/09/13 2137 06/10/13 0614 06/10/13 1559  GLUCAP 159* 133* 88 133* 275*    Recent Results (from the past 240 hour(s))  URINE CULTURE     Status: None   Collection Time    06/06/13  8:25 PM      Result Value Ref Range Status   Specimen Description URINE, CATHETERIZED   Final   Special Requests ADDED [id number]   Final   Culture  Setup Time     Final   Value: 06/07/2013 01:19     Performed at [facility]   Colony Count     Final   Value: NO GROWTH     Performed at [facility]   Culture     Final   Value: NO GROWTH     Performed at [facility]   Report Status 06/07/2013 FINAL   Final  CULTURE, BLOOD (ROUTINE X 2)     Status: None   Collection Time    06/06/13 11:20 PM      Result Value Ref Range Status   Specimen Description BLOOD RIGHT WRIST   Final   Special Requests BOTTLES DRAWN AEROBIC AND ANAEROBIC 3CC   Final   Culture  Setup Time     Final   Value: 06/07/2013 03:27     Performed at [facility]   Culture     Final   Value:        BLOOD CULTURE RECEIVED NO GROWTH TO DATE CULTURE WILL BE HELD FOR 5 DAYS BEFORE ISSUING A FINAL NEGATIVE REPORT     Performed at [facility]   Report Status PENDING   Incomplete  CULTURE, BLOOD (ROUTINE X 2)     Status: None   Collection Time    06/06/13 11:30 PM      Result Value Ref Range Status   Specimen Description BLOOD RIGHT HAND   Final   Special Requests BOTTLES DRAWN AEROBIC AND ANAEROBIC 5CC   Final   Culture  Setup Time     Final   Value: 06/07/2013 03:27     Performed at [facility]   Culture     Final   Value:        BLOOD  CULTURE RECEIVED NO GROWTH TO DATE CULTURE WILL BE HELD FOR 5 DAYS BEFORE ISSUING A FINAL NEGATIVE REPORT     Performed at [facility]   Report Status PENDING   Incomplete  MRSA PCR SCREENING     Status: None   Collection Time  06/07/13  3:23 AM      Result Value Ref Range Status   MRSA by PCR NEGATIVE  NEGATIVE Final   Comment:            The GeneXpert MRSA Assay (FDA     approved for NASAL specimens     only), is one component of a     comprehensive MRSA colonization     surveillance program. It is not     intended to diagnose MRSA     infection nor to guide or     monitor treatment for     MRSA infections.  CULTURE, ROUTINE-ABSCESS     Status: None   Collection Time    06/07/13  6:48 PM      Result Value Ref Range Status   Specimen Description PERITONEAL CAVITY   Final   Special Requests DIVERTICULAR ABSCESS   Final   Gram Stain     Final   Value: MODERATE WBC PRESENT,BOTH PMN AND MONONUCLEAR     NO SQUAMOUS EPITHELIAL CELLS SEEN     RARE GRAM POSITIVE COCCI     RARE GRAM NEGATIVE RODS     RARE GRAM POSITIVE RODS   Culture     Final   Value: Culture reincubated for better growth     Performed at [facility]   Report Status PENDING   Incomplete     Studies: No results found.  Scheduled Meds: . albuterol  2.5 mg Nebulization BID  . bisoprolol  5 mg Oral Daily  . budesonide (PULMICORT) nebulizer solution  0.25 mg Nebulization BID  . ceFEPime (MAXIPIME) IV  1 g Intravenous Q24H  . guaiFENesin  600 mg Oral BID  . heparin  5,000 Units Subcutaneous 3 times per day  . levothyroxine  50 mcg Oral QAC breakfast  . oseltamivir  30 mg Oral Daily  . predniSONE  10 mg Oral Q breakfast  . tiotropium  18 mcg Inhalation Daily  . torsemide  20 mg Oral Daily  . Warfarin - Pharmacist Dosing Inpatient   Does not apply q1800   Continuous Infusions: . dextrose 5 % and 0.45% NaCl 50 mL/hr at 06/09/13 2109    Principal Problem:   Colonic diverticular  abscess Active Problems:   HYPOTHYROIDISM   Atrial fibrillation   DIASTOLIC HEART FAILURE, CHRONIC   COPD with emphysema   Osteoarthritis   Oxygen dependent   Chronic gout due to renal impairment   Chronic kidney disease (CKD), stage IV     Time spent: >30 minutes    Natsumi Whitsitt  [facility] Pager [phone]. If 7PM-7AM, please contact night-coverage at [email or web address], password [facility] 06/10/2013, 5:34 PM  LOS: 4 days

## 2013-06-10 NOTE — Progress Notes (Signed)
Patient c/o SOB and requested to have a breathing treatment, no prn breathing treatments scheduled at this time, Claiborne BillingsCallahan, N.P. notified, new orders given, respiratory notified, will continue to monitor

## 2013-06-10 NOTE — Progress Notes (Signed)
Subjective: Pt ok, no new c/o. Sitting up in bed drinking clears. Some pain at drain site, c/o increased pain wqith radiation down left leg during flushes.  Objective: Physical Exam: BP 115/70  Pulse 116  Temp(Src) 101.8 F (38.8 C) (Rectal)  Resp 18  Ht 5\' 3"  (1.6 m)  Wt 186 lb 11.7 oz (84.7 kg)  BMI 33.09 kg/m2  SpO2 95% LLQ drain intact, site clean, mildly tender Output purulent/feculent   Labs: CBC  Recent Labs  06/09/13 0457  WBC 6.3  HGB 10.8*  HCT 34.3*  PLT 197   BMET  Recent Labs  06/09/13 0457  NA 145  K 5.0  CL 106  CO2 23  GLUCOSE 82  BUN 37*  CREATININE 1.34*  CALCIUM 8.6   LFT No results found for this basename: PROT, ALBUMIN, AST, ALT, ALKPHOS, BILITOT, BILIDIR, IBILI, LIPASE,  in the last 72 hours PT/INR  Recent Labs  06/08/13 0420  LABPROT 15.2  INR 1.23     Studies/Results: No results found.  Assessment/Plan: LLQ diverticulitis with abscess S/p perc drain 2/27. Recurrent fever 101.8, no new labs. Cont TID flushes to keep drain functioning properly. Also need to keep drain from kinking as it is in a more lateral position than most drains and has a tendency to fall under pt leg/hip Other plans per Primary and CCS    LOS: 4 days    Brayton ElBRUNING, Shyloh Derosa PA-C 06/10/2013 9:36 AM

## 2013-06-10 NOTE — Progress Notes (Signed)
Pt c/o SOB and is receiving flds at 50 ml/hr and has a hx of CHF.  MD notified and ordered KVO flds.  Will carry out MD orders and continue to monitor.

## 2013-06-11 ENCOUNTER — Inpatient Hospital Stay (HOSPITAL_COMMUNITY): Payer: Medicare Other

## 2013-06-11 ENCOUNTER — Ambulatory Visit: Payer: Medicare Other | Admitting: Critical Care Medicine

## 2013-06-11 DIAGNOSIS — J9610 Chronic respiratory failure, unspecified whether with hypoxia or hypercapnia: Secondary | ICD-10-CM

## 2013-06-11 LAB — BASIC METABOLIC PANEL
BUN: 30 mg/dL — ABNORMAL HIGH (ref 6–23)
CALCIUM: 8.9 mg/dL (ref 8.4–10.5)
CHLORIDE: 101 meq/L (ref 96–112)
CO2: 25 mEq/L (ref 19–32)
CREATININE: 1.37 mg/dL — AB (ref 0.50–1.10)
GFR calc non Af Amer: 35 mL/min — ABNORMAL LOW (ref >90)
GFR, EST AFRICAN AMERICAN: 41 mL/min — AB (ref >90)
Glucose, Bld: 102 mg/dL — ABNORMAL HIGH (ref 70–99)
Potassium: 4 mEq/L (ref 3.7–5.3)
Sodium: 139 mEq/L (ref 137–147)

## 2013-06-11 LAB — GLUCOSE, CAPILLARY
GLUCOSE-CAPILLARY: 142 mg/dL — AB (ref 70–99)
GLUCOSE-CAPILLARY: 123 mg/dL — AB (ref 70–99)
Glucose-Capillary: 118 mg/dL — ABNORMAL HIGH (ref 70–99)
Glucose-Capillary: 158 mg/dL — ABNORMAL HIGH (ref 70–99)

## 2013-06-11 LAB — CULTURE, ROUTINE-ABSCESS

## 2013-06-11 MED ORDER — IOHEXOL 300 MG/ML  SOLN
50.0000 mL | Freq: Once | INTRAMUSCULAR | Status: AC | PRN
  Administered 2013-06-11: 15 mL

## 2013-06-11 MED ORDER — MIDAZOLAM HCL 2 MG/2ML IJ SOLN
INTRAMUSCULAR | Status: AC
  Filled 2013-06-11: qty 2

## 2013-06-11 MED ORDER — FENTANYL CITRATE 0.05 MG/ML IJ SOLN
INTRAMUSCULAR | Status: AC
  Filled 2013-06-11: qty 2

## 2013-06-11 MED ORDER — SODIUM CHLORIDE 0.9 % IV SOLN
INTRAVENOUS | Status: AC | PRN
  Administered 2013-06-11: 50 mL/h via INTRAVENOUS

## 2013-06-11 MED ORDER — FENTANYL CITRATE 0.05 MG/ML IJ SOLN
INTRAMUSCULAR | Status: AC | PRN
  Administered 2013-06-11: 50 ug via INTRAVENOUS

## 2013-06-11 MED ORDER — MIDAZOLAM HCL 2 MG/2ML IJ SOLN
INTRAMUSCULAR | Status: AC | PRN
  Administered 2013-06-11: 1 mg via INTRAVENOUS

## 2013-06-11 NOTE — Progress Notes (Signed)
[facility] PROGRESS NOTE  [name] OZH:[id number] DOB: 1930/12/20 DOA: 06/06/2013 PCP: Shan Levans, MD  Assessment/Plan: 1-toxic encephalopathy: from diverticular abscess, PNA and influenza. -continue IV abx's, supportive care and tamiflu -PRN antiemetics and pain meds -patient AAOX3; afebrile since yesterday morning (3/02) -tolerating clear liquid diet and passing flatus, no BM -Reports abdominal pain has improved; has now replacement of her drain and is currently afebrile.  2-HCAP/influenza: will continue treatment with vanc and cefepime; continue tamiflu. -continue supportive care -continue oxygen supplementation, pulmicort, flutter valve and PRN nebulizers -Breathing overall stable; and no further SOB -continue demadex  3-Atrial fibrillation: rate controlled. -will continue heparin per pharmacy; holding coumadin in case patient needs surgery.  4-COPD with chronic resp failure: stable and actively no wheezing -will continue home inhalers -chronic prednisone and oxygen supplementation  5-CKD: stable and at baseline. Will monitor -Cr 1.7 and by GFR essentially at baseline  6-hypothyroidism: continue synthroid  7-chronic diastolic heart failure: compensated at this moment. -Last EF 60-65% -denies SOB today -will continue demadex  -strict I's and O's, daily weight  8-diverticular abscess: Status post drain placement by IR; pain improving and patient tolerating clear liquid diet and passing flatus. -spike fever overnight and has lost drain this afternoon -plan is to repeat CT abd and replace drain on 3/03 -WBCs has remained within normal limits now -Continue current IV antibiotics  -Drain per IR recommendations -poor candidate for surgery, hopping can be avoided. Will follow general surgery rec's  9-fever: most likely from abscess. -no further fever since yesterday morning -will continue current abx's -UA pending  DVT: on heparin  Code Status:  Full Family Communication: no family at bedside  Disposition Plan: to be determine   Consultants:  General surgery  IR   Procedures:  See below for x-ray reports  Abscess drain placement by IR (2/27) and replacement on (3/3)  Antibiotics:  vanc and cefepime 2/26  HPI/Subjective: AAOX3, afebrile; denies CP and SOB. S/o replace of percutaneous drain. abd pain improving.  Objective: Filed Vitals:   06/11/13 1020  BP: 125/65  Pulse: 105  Temp:   Resp: 18    Intake/Output Summary (Last 24 hours) at 06/11/13 1248 Last data filed at 06/11/13 0900  Gross per 24 hour  Intake    180 ml  Output    300 ml  Net   -120 ml   Filed Weights   06/09/13 0529 06/10/13 0623 06/11/13 0500  Weight: 82.8 kg (182 lb 8.7 oz) 84.7 kg (186 lb 11.7 oz) 84.3 kg (185 lb 13.6 oz)    Exam:   General:  AAOX3, no further fever; patient reports no further SOB. Reports abd is still painful but a lot better  Cardiovascular: irregular, no rubs or gallops  Respiratory: no wheezing; no rhonchi; slight decrease BS at bases   Abdomen: LLQ tenderness (present with deep palpation, but improving), positive BS, no guarding, no distension; drain in place.  Musculoskeletal: no joint swelling.  Data Reviewed: Basic Metabolic Panel:  Recent Labs Lab 06/06/13 2244 06/06/13 2333 06/07/13 0426 06/08/13 2341 06/09/13 0457 06/11/13 0327  NA 132* 135* 137  --  145 139  K 4.6 4.6 4.3  --  5.0 4.0  CL 89* 95* 97  --  106 101  CO2 25  --  25  --  23 25  GLUCOSE 194* 198* 160*  --  82 102*  BUN 62* 77* 57*  --  37* 30*  CREATININE 2.02* 2.10* 1.78*  --  1.34* 1.37*  CALCIUM 8.6  --  8.3*  --  8.6 8.9  MG  --   --   --  2.0  --   --    Liver Function Tests:  Recent Labs Lab 06/06/13 2244 06/07/13 0426  AST 48* 32  ALT 49* 38*  ALKPHOS 230* 201*  BILITOT 0.3 0.2*  PROT 6.7 6.2  ALBUMIN 2.7* 2.4*    Recent Labs Lab 06/06/13 2244  LIPASE 20   CBC:  Recent Labs Lab 06/06/13 2244  06/06/13 2333 06/07/13 0426 06/09/13 0457  WBC 11.2*  --  8.6 6.3  NEUTROABS 9.6*  --  6.7  --   HGB 11.4* 12.9 10.9* 10.8*  HCT 36.6 38.0 34.9* 34.3*  MCV 91.3  --  91.1 91.5  PLT 231  --  202 197   BNP (last 3 results)  Recent Labs  07/25/12 0822 12/05/12 1558 06/06/13 2244  PROBNP 399.0* 277.0* 1764.0*   CBG:  Recent Labs Lab 06/10/13 0614 06/10/13 1559 06/10/13 2112 06/11/13 0648 06/11/13 1118  GLUCAP 133* 275* 132* 118* 158*    Recent Results (from the past 240 hour(s))  URINE CULTURE     Status: None   Collection Time    06/06/13  8:25 PM      Result Value Ref Range Status   Specimen Description URINE, CATHETERIZED   Final   Special Requests ADDED [id number]   Final   Culture  Setup Time     Final   Value: 06/07/2013 01:19     Performed at [facility]   Colony Count     Final   Value: NO GROWTH     Performed at [facility]   Culture     Final   Value: NO GROWTH     Performed at [facility]   Report Status 06/07/2013 FINAL   Final  CULTURE, BLOOD (ROUTINE X 2)     Status: None   Collection Time    06/06/13 11:20 PM      Result Value Ref Range Status   Specimen Description BLOOD RIGHT WRIST   Final   Special Requests BOTTLES DRAWN AEROBIC AND ANAEROBIC 3CC   Final   Culture  Setup Time     Final   Value: 06/07/2013 03:27     Performed at [facility]   Culture     Final   Value:        BLOOD CULTURE RECEIVED NO GROWTH TO DATE CULTURE WILL BE HELD FOR 5 DAYS BEFORE ISSUING A FINAL NEGATIVE REPORT     Performed at [facility]   Report Status PENDING   Incomplete  CULTURE, BLOOD (ROUTINE X 2)     Status: None   Collection Time    06/06/13 11:30 PM      Result Value Ref Range Status   Specimen Description BLOOD RIGHT HAND   Final   Special Requests BOTTLES DRAWN AEROBIC AND ANAEROBIC 5CC   Final   Culture  Setup Time     Final   Value: 06/07/2013 03:27     Performed at [facility]   Culture      Final   Value:        BLOOD CULTURE RECEIVED NO GROWTH TO DATE CULTURE WILL BE HELD FOR 5 DAYS BEFORE ISSUING A FINAL NEGATIVE REPORT     Performed at [facility]   Report Status PENDING   Incomplete  MRSA PCR SCREENING  Status: None   Collection Time    06/07/13  3:23 AM      Result Value Ref Range Status   MRSA by PCR NEGATIVE  NEGATIVE Final   Comment:            The GeneXpert MRSA Assay (FDA     approved for NASAL specimens     only), is one component of a     comprehensive MRSA colonization     surveillance program. It is not     intended to diagnose MRSA     infection nor to guide or     monitor treatment for     MRSA infections.  CULTURE, ROUTINE-ABSCESS     Status: None   Collection Time    06/07/13  6:48 PM      Result Value Ref Range Status   Specimen Description PERITONEAL CAVITY   Final   Special Requests DIVERTICULAR ABSCESS   Final   Gram Stain     Final   Value: MODERATE WBC PRESENT,BOTH PMN AND MONONUCLEAR     NO SQUAMOUS EPITHELIAL CELLS SEEN     RARE GRAM POSITIVE COCCI     RARE GRAM NEGATIVE RODS     RARE GRAM POSITIVE RODS   Culture     Final   Value: MULTIPLE ORGANISMS PRESENT, NONE PREDOMINANT     Note: NO STAPHYLOCOCCUS AUREUS ISOLATED NO GROUP A STREP (S.PYOGENES) ISOLATED     [facility]formed at [facility]   Report Status 06/11/2013 FINAL   Final     Studies: No results found.  Scheduled Meds: . albuterol  2.5 mg Nebulization BID  . bisoprolol  5 mg Oral Daily  . budesonide (PULMICORT) nebulizer solution  0.25 mg Nebulization BID  . ceFEPime (MAXIPIME) IV  1 g Intravenous Q24H  . guaiFENesin  600 mg Oral BID  . heparin  5,000 Units Subcutaneous 3 times per day  . levothyroxine  50 mcg Oral QAC breakfast  . predniSONE  10 mg Oral Q breakfast  . tiotropium  18 mcg Inhalation Daily  . torsemide  20 mg Oral Daily  . Warfarin - Pharmacist Dosing Inpatient   Does not apply q1800   Continuous Infusions: . sodium chloride 50  mL/hr (06/11/13 0955)  . dextrose 5 % and 0.45% NaCl 10 mL/hr (06/10/13 1743)    Principal Problem:   Colonic diverticular abscess Active Problems:   HYPOTHYROIDISM   Atrial fibrillation   DIASTOLIC HEART FAILURE, CHRONIC   COPD with emphysema   Osteoarthritis   Oxygen dependent   Chronic gout due to renal impairment   Chronic kidney disease (CKD), stage IV     Time spent: >30 minutes    Minh Jasper  [facility] Pager [phone]. If 7PM-7AM, please contact night-coverage at [email or web address], password [email or web address] 06/11/2013, 12:48 PM  LOS: 5 days

## 2013-06-11 NOTE — Progress Notes (Deleted)
Patient evaluated for community based chronic disease management services with [facility] as a benefit of patient's [employer]. Spoke with patient at bedside to explain [facility].  Heidi HanlonCynthia A Burch is a 80 y.o. female with Past medical history of diabetes mellitus, obesity, COPD, chronic diastolic dysfunction, chronic kidney disease, obstructive sleep apnea, on home oxygen, hypothyroidism.  The patient is coming from home.  [facility] with lethargy and altered mental status that the family has noted today there was no fall no trauma no injury no recent change in her medications.  Patient has had a  cough since last week without any expectoration. Denies any shortness of breath chest pain palpitation nausea vomiting diarrhea or constipation or burning urination or active bleeding or focal neurological deficit.  She has chronic abdominal pain which is present since last few months and progressively gradually worsened.  She is compliant with her medication.  She lives at home with her son [name]([name] [phone])   and daughter [name]([name] [phone]) who is a Publishing rights managernurse practitioner.  The plan is to return home.  She is noted to be weak at this time and is unable to clearly express her acute symptom management plan at this time.  She indicated that she is able to attend all her scheduled appointments and does not have medication concerns.  Her caregivers were unavailable during assessment.  Patient will receive a post discharge transition of care call and will be evaluated for monthly home visits for assessments and disease process education.  Left contact information and [facility] literature at bedside. Made Inpatient Case Manager aware that [facility] following. Of note, [facility] services does not replace or interfere with any services that are arranged by inpatient case management or social work.  For additional  questions or referrals please contact Anibal Hendersonim Henderson BSN RN [facility] Hospital Liaison at [phone].

## 2013-06-11 NOTE — Progress Notes (Signed)
PT Cancellation Note  Patient Details Name: Nolberto HanlonCynthia A Riga MRN: [id number] DOB: 07/18/1930   Cancelled Treatment:    Reason Eval/Treat Not Completed: Patient declined, no reason specified (pt stated she is too nervous and tired after procedure today)   [name], Madesyn Ast Beth 06/11/2013, 2:18 PM Delaney MeigsMaija Tabor Dafna Romo, PT [phone]

## 2013-06-11 NOTE — Progress Notes (Signed)
ANTIBIOTIC and ANTICOAGULATION CONSULT NOTE - FOLLOW UP  Pharmacy Consult for Vancomycin, Cefepime Indication: pneumonia coverage/diverticular abscess   Pharmacy Consult for  Coumadin  Indication: Afib   Allergies  Allergen Reactions  . Sulfonamide Derivatives Other (See Comments)    Childhood reaction    Patient Measurements: Height: 5\' 3"  (160 cm) Weight: 185 lb 13.6 oz (84.3 kg) IBW/kg (Calculated) : 52.4  Vital Signs: Temp: 97.2 F (36.2 C) (03/03 0500) Temp src: Oral (03/03 0500) BP: 125/65 mmHg (03/03 1020) Pulse Rate: 105 (03/03 1020) Intake/Output from previous day: 03/02 0701 - 03/03 0700 In: 180 [P.O.:180] Out: 300 [Urine:300]  Labs:  Recent Labs  06/09/13 0457 06/11/13 0327  WBC 6.3  --   HGB 10.8*  --   PLT 197  --   CREATININE 1.34* 1.37*   Estimated Creatinine Clearance: 33.7 ml/min (by C-G formula based on Cr of 1.37).  Recent Labs  06/10/13 0320 06/10/13 1620  VANCOTROUGH 32.5*  --   VANCORANDOM  --  39.0    Assessment: ID: [id number] on Vancomycin / Cefepime for HCAP and diverticular abscess. A vancomycin random collected yesterday was supra-therapeutic at 39. Vanc is currently on hold and a repeat vanc random will be collected. Also on cefepime 1 gm IV Q 24 hours.  WBC wnl, patient is afebrile, and CrCl ~ 33 mL/min   AC: Coumadin is currently on hold in case patient needs surgery. Currently on heparin 5000 units Perkins Q 8 hours for VTE prophylaxis.   Goal of Therapy:  Vancomycin trough level 15-20 mcg/ml  Plan:  1) Continue to hold Vancomycin. 2) Continue Cefepime 1 gm IV Q 24 hours 3) Continue to hold Coumadin   4) Bmet in am; follow renal function. 5) Will plan to recheck random Vancomycin level on 3/4, about 48hrs after last dose. 6) F/u whether patient will require surgery.   Vinnie LevelBenjamin Rawlin Reaume, PharmD.  Clinical Pharmacist Pager [phone]

## 2013-06-11 NOTE — Procedures (Signed)
The percutaneous abscess drain was broken at the hub but the drain was not displaced.  The old drain was removed over a Bentson wire with fluoroscopy.  Drain injection confirmed a fistula tract to the sigmoid colon.  In fact, the wire preferentially went into the colon.  The wire was repositioned outside of the colon and a new 10 French drain was placed.  Bloody purulent fluid was removed.  No immediate complication.

## 2013-06-11 NOTE — Progress Notes (Signed)
Patient evaluated for community based chronic disease management services with [facility] Care Management Program as a benefit of patient's Medicare Insurance. Spoke with patient at bedside to explain [facility] Care Management services.  [name] is a 80 y.o. female with Past medical history of diabetes mellitus, obesity, COPD, chronic diastolic dysfunction, chronic kidney disease, obstructive sleep apnea, on home oxygen, hypothyroidism.  The patient is coming from home.  The patient presented to  ED on 2.26.15 with lethargy and altered mental status that the family has noted today there was no fall no trauma no injury no recent change in her medications.  Patient has had a  cough since last week without any expectoration. Denies any shortness of breath chest pain palpitation nausea vomiting diarrhea or constipation or burning urination or active bleeding or focal neurological deficit.  °She has chronic abdominal pain which is present since last few months and progressively gradually worsened.  °She is compliant with her medication.  She lives at home with her son ([name] [phone]) °  and daughter ([name] [phone]) who is a nurse practitioner.  The plan is to return home.  She is noted to be weak at this time and is unable to clearly express her acute symptom management plan at this time.  She indicated that she is able to attend all her scheduled appointments and does not have medication concerns.  Her caregivers were unavailable during assessment.  Patient will receive a post discharge transition of care call and will be evaluated for monthly home visits for assessments and disease process education.  Left contact information and [facility] literature at bedside. Made Inpatient Case Manager aware that [facility] Care Management following. Of note, [facility] Care Management services does not replace or interfere with any services that are arranged by inpatient case management or social work.  For additional  questions or referrals please contact Tim Henderson BSN RN [facility] Hospital Liaison at [phone].    °

## 2013-06-11 NOTE — Progress Notes (Signed)
Patient ID: Heidi HanlonCynthia A Burch, female   DOB: 04/10/1931, 80 y.o.   MRN: [id number]    Subjective: Drain dislodged yesterday, nervous about replacement.  Feels better.  Denies abd pain or nausea.  No BMs  Objective: Vital signs in last 24 hours: Temp:  [97.2 F (36.2 C)-97.5 F (36.4 C)] 97.2 F (36.2 C) (03/03 0500) Pulse Rate:  [74-77] 74 (03/03 0500) Resp:  [20] 20 (03/03 0500) BP: (122-130)/(56-79) 122/68 mmHg (03/03 0500) SpO2:  [95 %-97 %] 97 % (03/03 0500) Weight:  [185 lb 13.6 oz (84.3 kg)] 185 lb 13.6 oz (84.3 kg) (03/03 0500) Last BM Date: 06/06/13  Intake/Output from previous day: 03/02 0701 - 03/03 0700 In: 180 [P.O.:180] Out: 300 [Urine:300] Intake/Output this shift:    General appearance: alert, cooperative, no distress and morbidly obese GI: Minimal LLQ tenderness, no guarding  Lab Results:   Recent Labs  06/09/13 0457  WBC 6.3  HGB 10.8*  HCT 34.3*  PLT 197   BMET  Recent Labs  06/09/13 0457 06/11/13 0327  NA 145 139  K 5.0 4.0  CL 106 101  CO2 23 25  GLUCOSE 82 102*  BUN 37* 30*  CREATININE 1.34* 1.37*  CALCIUM 8.6 8.9     Studies/Results: No results found.  Anti-infectives: Anti-infectives   Start     Dose/Rate Route Frequency Ordered Stop   06/07/13 1800  cefTRIAXone (ROCEPHIN) 1 g in dextrose 5 % 50 mL IVPB  Status:  Discontinued     1 g 100 mL/hr over 30 Minutes Intravenous Every 24 hours 06/06/13 2134 06/07/13 0341   06/07/13 1600  azithromycin (ZITHROMAX) tablet 500 mg  Status:  Discontinued     500 mg Oral Every 24 hours 06/06/13 2134 06/07/13 0341   06/07/13 1145  oseltamivir (TAMIFLU) capsule 30 mg     30 mg Oral Daily 06/07/13 1133 06/12/13 0959   06/07/13 0600  ceFEPIme (MAXIPIME) 1 g in dextrose 5 % 50 mL IVPB  Status:  Discontinued     1 g 100 mL/hr over 30 Minutes Intravenous 3 times per day 06/07/13 0341 06/07/13 0353   06/07/13 0500  ceFEPIme (MAXIPIME) 1 g in dextrose 5 % 50 mL IVPB     1 g 100 mL/hr over 30  Minutes Intravenous Every 24 hours 06/07/13 0354 06/15/13 0459   06/07/13 0400  vancomycin (VANCOCIN) 1,250 mg in sodium chloride 0.9 % 250 mL IVPB  Status:  Discontinued     1,250 mg 166.7 mL/hr over 90 Minutes Intravenous Every 24 hours 06/07/13 0353 06/10/13 0508   06/07/13 0215  metroNIDAZOLE (FLAGYL) IVPB 500 mg     500 mg 100 mL/hr over 60 Minutes Intravenous  Once 06/07/13 0205 06/07/13 0302   06/06/13 2130  cefTRIAXone (ROCEPHIN) 1 g in dextrose 5 % 50 mL IVPB     1 g 100 mL/hr over 30 Minutes Intravenous  Once 06/06/13 2128 06/07/13 0004   06/06/13 2130  azithromycin (ZITHROMAX) 500 mg in dextrose 5 % 250 mL IVPB     500 mg 250 mL/hr over 60 Minutes Intravenous  Once 06/06/13 2128 06/07/13 0122      Assessment/Plan: Diverticular abscess, improved For drain replacement today. Drainage was feculent so I think drain needs to be replaced   LOS: 5 days    Krystn Dermody T 06/11/2013

## 2013-06-11 NOTE — Progress Notes (Signed)
PT Cancellation Note  Patient Details Name: Heidi HanlonCynthia A Burch MRN: [id number] DOB: 04/27/1930   Cancelled Treatment:    Reason Eval/Treat Not Completed: Patient at procedure or test/unavailable (pt having drain replaced and not available)   [name], [name] Beth 06/11/2013, 10:04 AM Delaney MeigsMaija Tabor [name], PT [phone]

## 2013-06-12 ENCOUNTER — Encounter: Payer: Self-pay | Admitting: Cardiology

## 2013-06-12 DIAGNOSIS — I10 Essential (primary) hypertension: Secondary | ICD-10-CM

## 2013-06-12 DIAGNOSIS — M1A00X0 Idiopathic chronic gout, unspecified site, without tophus (tophi): Secondary | ICD-10-CM

## 2013-06-12 LAB — GLUCOSE, CAPILLARY
GLUCOSE-CAPILLARY: 179 mg/dL — AB (ref 70–99)
Glucose-Capillary: 114 mg/dL — ABNORMAL HIGH (ref 70–99)
Glucose-Capillary: 196 mg/dL — ABNORMAL HIGH (ref 70–99)
Glucose-Capillary: 103 mg/dL — ABNORMAL HIGH (ref 70–99)

## 2013-06-12 LAB — CBC
HCT: 35.8 % — ABNORMAL LOW (ref 36.0–46.0)
HEMOGLOBIN: 11.2 g/dL — AB (ref 12.0–15.0)
MCH: 28.1 pg (ref 26.0–34.0)
MCHC: 31.3 g/dL (ref 30.0–36.0)
MCV: 89.7 fL (ref 78.0–100.0)
PLATELETS: 268 10*3/uL (ref 150–400)
RBC: 3.99 MIL/uL (ref 3.87–5.11)
RDW: 15.1 % (ref 11.5–15.5)
WBC: 11.9 10*3/uL — ABNORMAL HIGH (ref 4.0–10.5)

## 2013-06-12 LAB — VANCOMYCIN, RANDOM: Vancomycin Rm: 26.3 ug/mL

## 2013-06-12 MED ORDER — HYDROCODONE-ACETAMINOPHEN 5-325 MG PO TABS
2.0000 | ORAL_TABLET | ORAL | Status: DC | PRN
  Administered 2013-06-12 – 2013-06-13 (×3): 2 via ORAL
  Administered 2013-06-13: 1 via ORAL
  Administered 2013-06-14 – 2013-06-20 (×15): 2 via ORAL
  Filled 2013-06-12 (×7): qty 2
  Filled 2013-06-12: qty 1
  Filled 2013-06-12 (×5): qty 2
  Filled 2013-06-12: qty 1
  Filled 2013-06-12 (×5): qty 2
  Filled 2013-06-12: qty 1

## 2013-06-12 MED ORDER — WARFARIN SODIUM 4 MG PO TABS
4.0000 mg | ORAL_TABLET | Freq: Once | ORAL | Status: AC
  Administered 2013-06-12: 4 mg via ORAL
  Filled 2013-06-12: qty 1

## 2013-06-12 NOTE — Progress Notes (Signed)
ANTIBIOTIC and ANTICOAGULATION CONSULT NOTE - FOLLOW UP  Pharmacy Consult for Vancomycin, Cefepime Indication: pneumonia coverage/diverticular abscess   Pharmacy Consult for  Coumadin  Indication: Afib   Allergies  Allergen Reactions  . Sulfonamide Derivatives Other (See Comments)    Childhood reaction    Patient Measurements: Height: 5\' 3"  (160 cm) Weight: 182 lb 1.6 oz (82.6 kg) IBW/kg (Calculated) : 52.4  Vital Signs: Temp: 97.8 F (36.6 C) (03/04 0905) Temp src: Oral (03/04 0905) BP: 124/63 mmHg (03/04 0905) Pulse Rate: 106 (03/04 0905) Intake/Output from previous day: 03/03 0701 - 03/04 0700 In: 580 [P.O.:480; IV Piggyback:100] Out: 1775 [Urine:1775]  Labs:  Recent Labs  06/11/13 0327 06/12/13 0411  WBC  --  11.9*  HGB  --  11.2*  PLT  --  268  CREATININE 1.37*  --    Estimated Creatinine Clearance: 33.3 ml/min (by C-G formula based on Cr of 1.37).  Recent Labs  06/10/13 0320 06/10/13 1620 06/12/13 0411  VANCOTROUGH 32.5*  --   --   VANCORANDOM  --  39.0 26.3    Assessment: ID: [id number] YOF on Vancomycin / Cefepime for HCAP and diverticular abscess. A vancomycin random collected today remains supra-therapeutic at 26.3. Vanc is currently on hold and a repeat vanc random will be collected tomorrow. Also on cefepime 1 gm IV Q 24 hours.  WBC wnl, patient is afebrile, and CrCl ~ 33 mL/min   AC: Per hospitalist, okay to re-start coumadin since patient will not be having surgery. INR on 2/28 was 1.28. Likely lower today. Will order daily INR starting tomorrow. H/H and plt remain stable   Goal of Therapy:  Vancomycin trough level 15-20 mcg/ml  Plan:  -Coumadin 4 mg x 1 dose tonight  -Stop SQ heparin  -Cefepime 1g IV Q24H and monitor CBC, Cx, levels prn.  -Hold Vanc. Repeat VR on 3/5 -Monitor daily INR and s/s of bleeding    Vinnie LevelBenjamin Nakyia Dau, PharmD.  Clinical Pharmacist Pager [phone]

## 2013-06-12 NOTE — Progress Notes (Signed)
Patient ID: [name], female   DOB: 12-28-1930, 80 y.o.   MRN: [id number]    Subjective: Some lower abd pain last night but feels better this AM without any pain.  Tol CL diet without nausea. + flatus, no BM  Objective: Vital signs in last 24 hours: Temp:  [97.4 F (36.3 C)-97.7 F (36.5 C)] 97.5 F (36.4 C) (03/04 0500) Pulse Rate:  [88-110] 88 (03/04 0500) Resp:  [18-20] 18 (03/04 0500) BP: (122-157)/(53-72) 136/55 mmHg (03/04 0500) SpO2:  [97 %-98 %] 97 % (03/04 0500) Weight:  [182 lb 1.6 oz (82.6 kg)] 182 lb 1.6 oz (82.6 kg) (03/04 0500) Last BM Date: 06/06/13  Intake/Output from previous day: 03/03 0701 - 03/04 0700 In: 580 [P.O.:480; IV Piggyback:100] Out: 1775 [Urine:1775] Intake/Output this shift:    General appearance: alert, cooperative, no distress and morbidly obese GI: normal findings: soft, non-tender and drain in place LLQ with feculent drainage  Lab Results:   Recent Labs  06/12/13 0411  WBC 11.9*  HGB 11.2*  HCT 35.8*  PLT 268   BMET  Recent Labs  06/11/13 0327  NA 139  K 4.0  CL 101  CO2 25  GLUCOSE 102*  BUN 30*  CREATININE 1.37*  CALCIUM 8.9     Studies/Results: Ir Catheter Tube Change  06/11/2013   CLINICAL DATA:  80 year old with a percutaneous abscess drain. The percutaneous drainage catheter is broken and needs replacement.  EXAM: EXCHANGE OF DRAINAGE CATHETER WITH FLUOROSCOPY  Physician: Rachelle Hora. Lowella Dandy, MD  MEDICATIONS: 1 mg versed, 50 mcg fentanyl. A radiology nurse monitored the patient for moderate sedation.  ANESTHESIA/SEDATION: Moderate sedation time: 12 min  FLUOROSCOPY TIME:  3 min and 6 seconds  PROCEDURE: The procedure was explained to the patient. The risks and benefits of the procedure were discussed and the patient's questions were addressed. Informed consent was obtained from the patient. Examination demonstrated that the hub of the catheter had been broken. Catheter was sutured in place and did not appear to be  internally dislodged. The catheter was prepped and draped in a sterile fashion. Maximal barrier sterile technique was utilized including caps, mask, sterile gowns, sterile gloves, sterile drape, hand hygiene and skin antiseptic. Contrast was injected through the catheter. The catheter suture was removed and the catheter was removed over a Bentson wire. A 5 French Kumpe catheter was advanced over the wire and additional contrast was injected. A new 10 French catheter was placed over the Bentson wire and reconstituted in the pelvis. Additional contrast was injected through the catheter. Catheter was flushed with saline. Catheter was sutured to the skin with Prolene.  COMPLICATIONS: None  FINDINGS: There is contrast within the distal colon. After removal of the catheter, the wire preferentially went into the colon. The wire was pulled out of the colon. A new 10 French catheter was placed outside of the colon. Contrast injection demonstrated a fistula connection to the sigmoid colon. Bloody purulent fluid was aspirated from the catheter.  IMPRESSION: Successful replacement of the left pelvic abscess drainage catheter.  There is fistula connection between the abscess collection and colon.   Electronically Signed   By: Richarda Overlie M.D.   On: 06/11/2013 14:19    Anti-infectives: Anti-infectives   Start     Dose/Rate Route Frequency Ordered Stop   06/07/13 1800  cefTRIAXone (ROCEPHIN) 1 g in dextrose 5 % 50 mL IVPB  Status:  Discontinued     1 g 100 mL/hr over 30 Minutes Intravenous Every  24 hours 06/06/13 2134 06/07/13 0341   06/07/13 1600  azithromycin (ZITHROMAX) tablet 500 mg  Status:  Discontinued     500 mg Oral Every 24 hours 06/06/13 2134 06/07/13 0341   06/07/13 1145  oseltamivir (TAMIFLU) capsule 30 mg     30 mg Oral Daily 06/07/13 1133 06/11/13 1107   06/07/13 0600  ceFEPIme (MAXIPIME) 1 g in dextrose 5 % 50 mL IVPB  Status:  Discontinued     1 g 100 mL/hr over 30 Minutes Intravenous 3 times per day  06/07/13 0341 06/07/13 0353   06/07/13 0500  ceFEPIme (MAXIPIME) 1 g in dextrose 5 % 50 mL IVPB     1 g 100 mL/hr over 30 Minutes Intravenous Every 24 hours 06/07/13 0354 06/15/13 0459   06/07/13 0400  vancomycin (VANCOCIN) 1,250 mg in sodium chloride 0.9 % 250 mL IVPB  Status:  Discontinued     1,250 mg 166.7 mL/hr over 90 Minutes Intravenous Every 24 hours 06/07/13 0353 06/10/13 0508   06/07/13 0215  metroNIDAZOLE (FLAGYL) IVPB 500 mg     500 mg 100 mL/hr over 60 Minutes Intravenous  Once 06/07/13 0205 06/07/13 0302   06/06/13 2130  cefTRIAXone (ROCEPHIN) 1 g in dextrose 5 % 50 mL IVPB     1 g 100 mL/hr over 30 Minutes Intravenous  Once 06/06/13 2128 06/07/13 0004   06/06/13 2130  azithromycin (ZITHROMAX) 500 mg in dextrose 5 % 250 mL IVPB     500 mg 250 mL/hr over 60 Minutes Intravenous  Once 06/06/13 2128 06/07/13 0122      Assessment/Plan: Diverticular abscess.  Stable/improving p drainage and abx.  High operative risk.  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 Continue current management.     LOS: 6 days    Glorian Mcdonell T 06/12/2013

## 2013-06-12 NOTE — Progress Notes (Signed)
[facility] PROGRESS NOTE Assessment/Plan: Toxic encephalopathy due to infectious etiology: - Diverticular abscess and influenza.  - Continue IV abx's, supportive care and tamiflu  - PRN antiemetics and pain meds  - Tolerating clear liquid diet and passing flatus, no BM  - Reports abdominal pain has improved; has now replacement of her drain and is currently afebrile.  - Will need SNF.  Diverticular abscess:  - Status post drain replacement - Afebrile for >48Hrs. - Plan is to repeat CT abd on 3.4.2015. - Continue current IV antibiotics (cefepime started on 2.27.2015) - poor candidate for surgery, hopping can be avoided. Will follow general surgery rec's  - Drain replacement on  3.3.2015. - increase narcotics.  HCAP/influenza:  - will continue treatment with vanc and cefepime; continue tamiflu.  - continue oxygen supplementation, pulmicort, flutter valve and PRN nebulizers  - Breathing overall stable; and no further SOB   Atrial fibrillation: rate controlled.  - will continue heparin per pharmacy; holding coumadin in case patient needs surgery.   COPD with chronic resp failure:  - stable and actively no wheezing  -will continue home inhalers  -chronic prednisone and oxygen supplementation.  CKD: stable and at baseline. Will monitor  - Cr 1.7 and by GFR essentially at baseline   Chronic diastolic heart failure: compensated at this moment.  - Last EF 60-65%  - denies SOB today  - will continue demadex  - strict I's and O's, daily weight      Code Status: Full  Family Communication: no family at bedside  Disposition Plan: to be determine    Consultants:  [facility]  Procedures: See below for x-ray reports Abscess drain placement by IR (2/27) and replacement on (3/3)   Antibiotics: vanc and cefepime, vanc stop on 3.2.2015.  HPI/Subjective: Pain controlled.   Objective: Filed Vitals:   06/11/13 1300 06/11/13 2002 06/11/13 2055 06/12/13 0500  BP:  122/59  140/60 136/55  Pulse: 92  89 88  Temp: 97.7 F (36.5 C)  97.4 F (36.3 C) 97.5 F (36.4 C)  TempSrc: Oral  Oral Oral  Resp: 18  18 18   Height:      Weight:    82.6 kg (182 lb 1.6 oz)  SpO2: 98% 98% 98% 97%    Intake/Output Summary (Last 24 hours) at 06/12/13 0647 Last data filed at 06/12/13 0600  Gross per 24 hour  Intake    580 ml  Output   1775 ml  Net  -1195 ml   Filed Weights   06/10/13 0623 06/11/13 0500 06/12/13 0500  Weight: 84.7 kg (186 lb 11.7 oz) 84.3 kg (185 lb 13.6 oz) 82.6 kg (182 lb 1.6 oz)    Exam:  General: Alert, awake, oriented x3, in no acute distress.  HEENT: No bruits, no goiter.  Heart: Regular rate and rhythm, without murmurs, rubs, gallops.  Lungs: Good air movement, clear    Data Reviewed: Basic Metabolic Panel:  Recent Labs Lab 06/06/13 2244 06/06/13 2333 06/07/13 0426 06/08/13 2341 06/09/13 0457 06/11/13 0327  NA 132* 135* 137  --  145 139  K 4.6 4.6 4.3  --  5.0 4.0  CL 89* 95* 97  --  106 101  CO2 25  --  25  --  23 25  GLUCOSE 194* 198* 160*  --  82 102*  BUN 62* 77* 57*  --  37* 30*  CREATININE 2.02* 2.10* 1.78*  --  1.34* 1.37*  CALCIUM 8.6  --  8.3*  --  8.6 8.9  MG  --   --   --  2.0  --   --    Liver Function Tests:  Recent Labs Lab 06/06/13 2244 06/07/13 0426  AST 48* 32  ALT 49* 38*  ALKPHOS 230* 201*  BILITOT 0.3 0.2*  PROT 6.7 6.2  ALBUMIN 2.7* 2.4*    Recent Labs Lab 06/06/13 2244  LIPASE 20   No results found for this basename: AMMONIA,  in the last 168 hours CBC:  Recent Labs Lab 06/06/13 2244 06/06/13 2333 06/07/13 0426 06/09/13 0457 06/12/13 0411  WBC 11.2*  --  8.6 6.3 11.9*  NEUTROABS 9.6*  --  6.7  --   --   HGB 11.4* 12.9 10.9* 10.8* 11.2*  HCT 36.6 38.0 34.9* 34.3* 35.8*  MCV 91.3  --  91.1 91.5 89.7  PLT 231  --  202 197 268   Cardiac Enzymes: No results found for this basename: CKTOTAL, CKMB, CKMBINDEX, TROPONINI,  in the last 168 hours BNP (last 3 results)  Recent  Labs  07/25/12 0822 12/05/12 1558 06/06/13 2244  PROBNP 399.0* 277.0* 1764.0*   CBG:  Recent Labs Lab 06/11/13 0648 06/11/13 1118 06/11/13 1600 06/11/13 2129 06/12/13 0615  GLUCAP 118* 158* 142* 123* 114*    Recent Results (from the past 240 hour(s))  URINE CULTURE     Status: None   Collection Time    06/06/13  8:25 PM      Result Value Ref Range Status   Specimen Description URINE, CATHETERIZED   Final   Special Requests ADDED [phone]   Final   Culture  Setup Time     Final   Value: 06/07/2013 01:19     Performed at [facility]   Colony Count     Final   Value: NO GROWTH     Performed at [facility]   Culture     Final   Value: NO GROWTH     Performed at [facility]   Report Status 06/07/2013 FINAL   Final  CULTURE, BLOOD (ROUTINE X 2)     Status: None   Collection Time    06/06/13 11:20 PM      Result Value Ref Range Status   Specimen Description BLOOD RIGHT WRIST   Final   Special Requests BOTTLES DRAWN AEROBIC AND ANAEROBIC 3CC   Final   Culture  Setup Time     Final   Value: 06/07/2013 03:27     Performed at [facility]   Culture     Final   Value:        BLOOD CULTURE RECEIVED NO GROWTH TO DATE CULTURE WILL BE HELD FOR 5 DAYS BEFORE ISSUING A FINAL NEGATIVE REPORT     Performed at [facility]   Report Status PENDING   Incomplete  CULTURE, BLOOD (ROUTINE X 2)     Status: None   Collection Time    06/06/13 11:30 PM      Result Value Ref Range Status   Specimen Description BLOOD RIGHT HAND   Final   Special Requests BOTTLES DRAWN AEROBIC AND ANAEROBIC 5CC   Final   Culture  Setup Time     Final   Value: 06/07/2013 03:27     Performed at [facility]   Culture     Final   Value:        BLOOD CULTURE RECEIVED NO GROWTH TO DATE CULTURE WILL BE HELD FOR 5  DAYS BEFORE ISSUING A FINAL NEGATIVE REPORT     Performed at [facility]   Report Status PENDING   Incomplete  MRSA PCR SCREENING      Status: None   Collection Time    06/07/13  3:23 AM      Result Value Ref Range Status   MRSA by PCR NEGATIVE  NEGATIVE Final   Comment:            The GeneXpert MRSA Assay (FDA     approved for NASAL specimens     only), is one component of a     comprehensive MRSA colonization     surveillance program. It is not     intended to diagnose MRSA     infection nor to guide or     monitor treatment for     MRSA infections.  CULTURE, ROUTINE-ABSCESS     Status: None   Collection Time    06/07/13  6:48 PM      Result Value Ref Range Status   Specimen Description PERITONEAL CAVITY   Final   Special Requests DIVERTICULAR ABSCESS   Final   Gram Stain     Final   Value: MODERATE WBC PRESENT,BOTH PMN AND MONONUCLEAR     NO SQUAMOUS EPITHELIAL CELLS SEEN     RARE GRAM POSITIVE COCCI     RARE GRAM NEGATIVE RODS     RARE GRAM POSITIVE RODS   Culture     Final   Value: MULTIPLE ORGANISMS PRESENT, NONE PREDOMINANT     Note: NO STAPHYLOCOCCUS AUREUS ISOLATED NO GROUP A STREP (S.PYOGENES) ISOLATED     Performed at [facility]   Report Status 06/11/2013 FINAL   Final     Studies: Ir Catheter Tube Change  06/11/2013   CLINICAL DATA:  80 year old with a percutaneous abscess drain. The percutaneous drainage catheter is broken and needs replacement.  EXAM: EXCHANGE OF DRAINAGE CATHETER WITH FLUOROSCOPY  Physician: Rachelle Hora. Lowella Dandy, MD  MEDICATIONS: 1 mg versed, 50 mcg fentanyl. A radiology nurse monitored the patient for moderate sedation.  ANESTHESIA/SEDATION: Moderate sedation time: 12 min  FLUOROSCOPY TIME:  3 min and 6 seconds  PROCEDURE: The procedure was explained to the patient. The risks and benefits of the procedure were discussed and the patient's questions were addressed. Informed consent was obtained from the patient. Examination demonstrated that the hub of the catheter had been broken. Catheter was sutured in place and did not appear to be internally dislodged. The catheter was prepped  and draped in a sterile fashion. Maximal barrier sterile technique was utilized including caps, mask, sterile gowns, sterile gloves, sterile drape, hand hygiene and skin antiseptic. Contrast was injected through the catheter. The catheter suture was removed and the catheter was removed over a Bentson wire. A 5 French Kumpe catheter was advanced over the wire and additional contrast was injected. A new 10 French catheter was placed over the Bentson wire and reconstituted in the pelvis. Additional contrast was injected through the catheter. Catheter was flushed with saline. Catheter was sutured to the skin with Prolene.  COMPLICATIONS: None  FINDINGS: There is contrast within the distal colon. After removal of the catheter, the wire preferentially went into the colon. The wire was pulled out of the colon. A new 10 French catheter was placed outside of the colon. Contrast injection demonstrated a fistula connection to the sigmoid colon. Bloody purulent fluid was aspirated from the catheter.  IMPRESSION: Successful replacement of the left pelvic  abscess drainage catheter.  There is fistula connection between the abscess collection and colon.   Electronically Signed   By: Richarda Overlie M.D.   On: 06/11/2013 14:19    Scheduled Meds: . albuterol  2.5 mg Nebulization BID  . bisoprolol  5 mg Oral Daily  . budesonide (PULMICORT) nebulizer solution  0.25 mg Nebulization BID  . ceFEPime (MAXIPIME) IV  1 g Intravenous Q24H  . guaiFENesin  600 mg Oral BID  . heparin  5,000 Units Subcutaneous 3 times per day  . levothyroxine  50 mcg Oral QAC breakfast  . predniSONE  10 mg Oral Q breakfast  . tiotropium  18 mcg Inhalation Daily  . torsemide  20 mg Oral Daily  . Warfarin - Pharmacist Dosing Inpatient   Does not apply q1800   Continuous Infusions: . dextrose 5 % and 0.45% NaCl 10 mL/hr (06/10/13 1743)     Marinda Elk  [facility] Pager [phone]. If 8PM-8AM, please contact night-coverage at  [email or web address], password [facility] 06/12/2013, 6:47 AM  LOS: 6 days

## 2013-06-12 NOTE — Progress Notes (Signed)
Subjective: Pt feeling ok Drain exchanged yesterday and injection confirms fistula to bowel She has some soreness at drain site as expected  Objective: Physical Exam: BP 136/55  Pulse 88  Temp(Src) 97.5 F (36.4 C) (Oral)  Resp 18  Ht 5\' 3"  (1.6 m)  Wt 182 lb 1.6 oz (82.6 kg)  BMI 32.27 kg/m2  SpO2 97% Drain intact, site clean. Mildly tender Output reddish brown    Labs: CBC  Recent Labs  06/12/13 0411  WBC 11.9*  HGB 11.2*  HCT 35.8*  PLT 268   BMET  Recent Labs  06/11/13 0327  NA 139  K 4.0  CL 101  CO2 25  GLUCOSE 102*  BUN 30*  CREATININE 1.37*  CALCIUM 8.9   LFT No results found for this basename: PROT, ALBUMIN, AST, ALT, ALKPHOS, BILITOT, BILIDIR, IBILI, LIPASE,  in the last 72 hours PT/INR No results found for this basename: LABPROT, INR,  in the last 72 hours   Studies/Results: Ir Catheter Tube Change  06/11/2013   CLINICAL DATA:  80 year old with a percutaneous abscess drain. The percutaneous drainage catheter is broken and needs replacement.  EXAM: EXCHANGE OF DRAINAGE CATHETER WITH FLUOROSCOPY  Physician: Rachelle HoraAdam R. Lowella DandyHenn, MD  MEDICATIONS: 1 mg versed, 50 mcg fentanyl. A radiology nurse monitored the patient for moderate sedation.  ANESTHESIA/SEDATION: Moderate sedation time: 12 min  FLUOROSCOPY TIME:  3 min and 6 seconds  PROCEDURE: The procedure was explained to the patient. The risks and benefits of the procedure were discussed and the patient's questions were addressed. Informed consent was obtained from the patient. Examination demonstrated that the hub of the catheter had been broken. Catheter was sutured in place and did not appear to be internally dislodged. The catheter was prepped and draped in a sterile fashion. Maximal barrier sterile technique was utilized including caps, mask, sterile gowns, sterile gloves, sterile drape, hand hygiene and skin antiseptic. Contrast was injected through the catheter. The catheter suture was removed and the  catheter was removed over a Bentson wire. A 5 French Kumpe catheter was advanced over the wire and additional contrast was injected. A new 10 French catheter was placed over the Bentson wire and reconstituted in the pelvis. Additional contrast was injected through the catheter. Catheter was flushed with saline. Catheter was sutured to the skin with Prolene.  COMPLICATIONS: None  FINDINGS: There is contrast within the distal colon. After removal of the catheter, the wire preferentially went into the colon. The wire was pulled out of the colon. A new 10 French catheter was placed outside of the colon. Contrast injection demonstrated a fistula connection to the sigmoid colon. Bloody purulent fluid was aspirated from the catheter.  IMPRESSION: Successful replacement of the left pelvic abscess drainage catheter.  There is fistula connection between the abscess collection and colon.   Electronically Signed   By: Richarda OverlieAdam  Henn M.D.   On: 06/11/2013 14:19    Assessment/Plan: S/p LLQ diverticular abscess drain/exchange Confirmed fistula Cont drain care/TID flushes Other plans per [facility]    LOS: 6 days    Brayton ElBRUNING, Shandrea Lusk PA-C 06/12/2013 9:04 AM

## 2013-06-12 NOTE — Evaluation (Signed)
Physical Therapy Evaluation Patient Details Name: [name] MRN: [id number] DOB: 1931-03-17 Today's Date: 06/12/2013 Time: 4098-1191 PT Time Calculation (min): 26 min  PT Assessment / Plan / Recommendation History of Present Illness  [name] is a 80 year old woman with PMH of GERD, dCHF, CKD, hyperlipidemia, COPD, a-fib, PVD, hypothyroidism, diverticulosis, and multiple episodes of C diff who presents with a one-day history of fever, chills, weakness, and vomiting x1. She woke up this morning feeling cold and weak, and vomited once after having a few sips of water. Her daughter checked her temperature and found it was 101.4. She usually gets around with a walker, but she felt too weak to use it so her daughter had to help her into her wheelchair to come to the ED  Clinical Impression  Pt with generalized weakness and deconditioning requiring min to moderate assist for transfers and ambulation at this time. Spoke with son and pt and son feels he can provide this level of assist with patient upon d/c and deferred SNF recommendation. Pt must complete stair negotiation prior to d/c as pt has 6 steps to enter home. Pt son agreed. Acute PT to con't to follow to progress mobility.     PT Assessment  Patient needs continued PT services    Follow Up Recommendations  Home health PT;Supervision/Assistance - 24 hour    Does the patient have the potential to tolerate intense rehabilitation      Barriers to Discharge   unsure if son can truly handle pt    Equipment Recommendations  None recommended by PT    Recommendations for Other Services     Frequency Min 3X/week    Precautions / Restrictions Precautions Precautions: Fall Precaution Comments: pt with L flank drain Restrictions Weight Bearing Restrictions: No   Pertinent Vitals/Pain Minimal pain at drainage site      Mobility  Bed Mobility Overal bed mobility:  (pt up in chair upon PT arrival) Transfers Overall  transfer level: Needs assistance Equipment used: Rolling walker (2 wheeled) Transfers: Sit to/from Stand Sit to Stand: Mod assist General transfer comment: increased time, max directional v/c's for hand placement. Pt required increased time. Ambulation/Gait Ambulation/Gait assistance: Min assist Ambulation Distance (Feet): 12 Feet Assistive device: Rolling walker (2 wheeled) Gait Pattern/deviations: Step-through pattern;Decreased stride length;Shuffle Gait velocity: slow Gait velocity interpretation: <1.8 ft/sec, indicative of risk for recurrent falls General Gait Details: pt required maximal encouragement to ambulate, extremely slow shuffled gait pattern    Exercises     PT Diagnosis: Difficulty walking;Generalized weakness  PT Problem List: Decreased strength;Decreased activity tolerance;Decreased balance;Decreased mobility PT Treatment Interventions: DME instruction;Gait training;Stair training;Functional mobility training;Therapeutic activities;Therapeutic exercise;Balance training     PT Goals(Current goals can be found in the care plan section) Acute Rehab PT Goals Patient Stated Goal: home PT Goal Formulation: With patient/family Time For Goal Achievement: 06/19/13 Potential to Achieve Goals: Fair  Visit Information  Last PT Received On: 06/12/13 Assistance Needed: +1 History of Present Illness: [name] is a 80 year old woman with PMH of GERD, dCHF, CKD, hyperlipidemia, COPD, a-fib, PVD, hypothyroidism, diverticulosis, and multiple episodes of C diff who presents with a one-day history of fever, chills, weakness, and vomiting x1. She woke up this morning feeling cold and weak, and vomited once after having a few sips of water. Her daughter checked her temperature and found it was 101.4. She usually gets around with a walker, but she felt too weak to use it so her daughter had to help her  into her wheelchair to come to the ED       Prior Functioning  Home  Living Family/patient expects to be discharged to:: Private residence Living Arrangements: Children Available Help at Discharge: Family Type of Home: House Home Access: Stairs to enter Secretary/administratorntrance Stairs-Number of Steps: 6 Entrance Stairs-Rails: Can reach both Home Layout: Two level Alternate Level Stairs-Number of Steps: one flight Alternate Level Stairs-Rails: Right Home Equipment: Walker - 2 wheels Additional Comments: son is available to assist with stairs and care Prior Function Level of Independence: Needs assistance Gait / Transfers Assistance Needed: RW inside and WC outside ADL's / Homemaking Assistance Needed: ADLs 50% assist for bathing and dressing, daughter-in-law bathes pt in evening Communication Communication: No difficulties    Cognition  Cognition Arousal/Alertness: Awake/alert Behavior During Therapy: WFL for tasks assessed/performed Overall Cognitive Status: Within Functional Limits for tasks assessed    Extremity/Trunk Assessment Upper Extremity Assessment Upper Extremity Assessment: Generalized weakness Lower Extremity Assessment Lower Extremity Assessment: Generalized weakness (L more involved) Cervical / Trunk Assessment Cervical / Trunk Assessment: Normal (distended abdominal)   Balance Balance Overall balance assessment: Needs assistance Standing balance support: Bilateral upper extremity supported Standing balance-Leahy Scale: Poor Standing balance comment: requires use of RW  End of Session PT - End of Session Equipment Utilized During Treatment: Gait belt;Oxygen Activity Tolerance: Patient limited by fatigue Patient left: in chair;with call bell/phone within reach;with family/visitor present Nurse Communication: Mobility status  GP     Marcene BrawnChadwell, Tonette Koehne Marie 06/12/2013, 4:31 PM  Lewis ShockAshly Kage Willmann, PT, DPT Pager #: [phone] Office #: [phone]

## 2013-06-13 LAB — CLOSTRIDIUM DIFFICILE BY PCR: Toxigenic C. Difficile by PCR: NEGATIVE

## 2013-06-13 LAB — GLUCOSE, CAPILLARY
GLUCOSE-CAPILLARY: 71 mg/dL (ref 70–99)
GLUCOSE-CAPILLARY: 132 mg/dL — AB (ref 70–99)
GLUCOSE-CAPILLARY: 109 mg/dL — AB (ref 70–99)
Glucose-Capillary: 108 mg/dL — ABNORMAL HIGH (ref 70–99)
Glucose-Capillary: 155 mg/dL — ABNORMAL HIGH (ref 70–99)

## 2013-06-13 LAB — PROTIME-INR
INR: 1.26 (ref 0.00–1.49)
PROTHROMBIN TIME: 15.5 s — AB (ref 11.6–15.2)

## 2013-06-13 LAB — CULTURE, BLOOD (ROUTINE X 2)
Culture: NO GROWTH
Culture: NO GROWTH

## 2013-06-13 LAB — VANCOMYCIN, RANDOM: VANCOMYCIN RM: 21 ug/mL

## 2013-06-13 MED ORDER — PIPERACILLIN-TAZOBACTAM 3.375 G IVPB
3.3750 g | Freq: Three times a day (TID) | INTRAVENOUS | Status: DC
  Administered 2013-06-13 – 2013-06-20 (×22): 3.375 g via INTRAVENOUS
  Filled 2013-06-13 (×25): qty 50

## 2013-06-13 MED ORDER — METRONIDAZOLE IN NACL 5-0.79 MG/ML-% IV SOLN
500.0000 mg | Freq: Four times a day (QID) | INTRAVENOUS | Status: DC
  Filled 2013-06-13 (×2): qty 100

## 2013-06-13 MED ORDER — WARFARIN SODIUM 2 MG PO TABS
2.0000 mg | ORAL_TABLET | Freq: Once | ORAL | Status: DC
  Filled 2013-06-13: qty 1

## 2013-06-13 MED ORDER — WARFARIN SODIUM 4 MG PO TABS
4.0000 mg | ORAL_TABLET | Freq: Once | ORAL | Status: AC
  Administered 2013-06-13: 4 mg via ORAL
  Filled 2013-06-13: qty 1

## 2013-06-13 MED ORDER — PIPERACILLIN SOD-TAZOBACTAM SO 2.25 (2-0.25) G IV SOLR: 75.0000 mg/kg | Freq: Three times a day (TID) | INTRAVENOUS | Status: DC

## 2013-06-13 NOTE — Progress Notes (Signed)
Patient interviewed and examined, agree with PA note above.  Mariella SaaBenjamin T Natalea Sutliff MD, FACS  06/13/2013 5:30 PM

## 2013-06-13 NOTE — Progress Notes (Signed)
Subjective: Pt feeling much better, small amount of pain at drain site.  No N/V tolerating clears and wants more food.  Mobilizing with PT but says "I don't even wake up until 1100 in the morning how can they expect me to do physical therapy."  Having BM's and flatus, urinating well.  Leg wound improving.     Objective: Vital signs in last 24 hours: Temp:  [97.9 F (36.6 C)-98.2 F (36.8 C)] 98.2 F (36.8 C) (03/05 0541) Pulse Rate:  [75-94] 75 (03/05 0541) Resp:  [17-18] 17 (03/05 0541) BP: (112-134)/(59-68) 120/59 mmHg (03/05 0541) SpO2:  [96 %-98 %] 97 % (03/05 0958) Weight:  [177 lb 0.5 oz (80.3 kg)] 177 lb 0.5 oz (80.3 kg) (03/05 0541) Last BM Date: 06/12/13  Intake/Output from previous day: 03/04 0701 - 03/05 0700 In: 530 [P.O.:480; IV Piggyback:50] Out: -  Intake/Output this shift: Total I/O In: 120 [P.O.:120] Out: 330 [Urine:300; Drains:30]  PE: Gen:  Alert, NAD, pleasant Abd: Soft, tenderness at drain site in left lower outer abdomen, some what distended, +BS, no HSM, drain putting out 30mL brown feculent drainage.   Lab Results:   Recent Labs  06/12/13 0411  WBC 11.9*  HGB 11.2*  HCT 35.8*  PLT 268   BMET  Recent Labs  06/11/13 0327  NA 139  K 4.0  CL 101  CO2 25  GLUCOSE 102*  BUN 30*  CREATININE 1.37*  CALCIUM 8.9   PT/INR  Recent Labs  06/13/13 1005  LABPROT 15.5*  INR 1.26   CMP     Component Value Date/Time   NA 139 06/11/2013 0327   K 4.0 06/11/2013 0327   CL 101 06/11/2013 0327   CO2 25 06/11/2013 0327   GLUCOSE 102* 06/11/2013 0327   BUN 30* 06/11/2013 0327   CREATININE 1.37* 06/11/2013 0327   CREATININE 1.90* 03/23/2012 1621   CALCIUM 8.9 06/11/2013 0327   PROT 6.2 06/07/2013 0426   ALBUMIN 2.4* 06/07/2013 0426   AST 32 06/07/2013 0426   ALT 38* 06/07/2013 0426   ALKPHOS 201* 06/07/2013 0426   BILITOT 0.2* 06/07/2013 0426   GFRNONAA 35* 06/11/2013 0327   GFRAA 41* 06/11/2013 0327   Lipase     Component Value Date/Time   LIPASE 20  06/06/2013 2244       Studies/Results: No results found.  Anti-infectives: Anti-infectives   Start     Dose/Rate Route Frequency Ordered Stop   06/07/13 1800  cefTRIAXone (ROCEPHIN) 1 g in dextrose 5 % 50 mL IVPB  Status:  Discontinued     1 g 100 mL/hr over 30 Minutes Intravenous Every 24 hours 06/06/13 2134 06/07/13 0341   06/07/13 1600  azithromycin (ZITHROMAX) tablet 500 mg  Status:  Discontinued     500 mg Oral Every 24 hours 06/06/13 2134 06/07/13 0341   06/07/13 1145  oseltamivir (TAMIFLU) capsule 30 mg     30 mg Oral Daily 06/07/13 1133 06/11/13 1107   06/07/13 0600  ceFEPIme (MAXIPIME) 1 g in dextrose 5 % 50 mL IVPB  Status:  Discontinued     1 g 100 mL/hr over 30 Minutes Intravenous 3 times per day 06/07/13 0341 06/07/13 0353   06/07/13 0500  ceFEPIme (MAXIPIME) 1 g in dextrose 5 % 50 mL IVPB     1 g 100 mL/hr over 30 Minutes Intravenous Every 24 hours 06/07/13 0354 06/15/13 0459   06/07/13 0400  vancomycin (VANCOCIN) 1,250 mg in sodium chloride 0.9 % 250 mL IVPB  Status:  Discontinued     1,250 mg 166.7 mL/hr over 90 Minutes Intravenous Every 24 hours 06/07/13 0353 06/10/13 0508   06/07/13 0215  metroNIDAZOLE (FLAGYL) IVPB 500 mg     500 mg 100 mL/hr over 60 Minutes Intravenous  Once 06/07/13 0205 06/07/13 0302   06/06/13 2130  cefTRIAXone (ROCEPHIN) 1 g in dextrose 5 % 50 mL IVPB     1 g 100 mL/hr over 30 Minutes Intravenous  Once 06/06/13 2128 06/07/13 0004   06/06/13 2130  azithromycin (ZITHROMAX) 500 mg in dextrose 5 % 250 mL IVPB     500 mg 250 mL/hr over 60 Minutes Intravenous  Once 06/06/13 2128 06/07/13 0122      Antibiotics  Azithromycin: 06/06/13 >> 06/07/13  Ceftriaxone: 06/06/13 >> 06/07/13  Flagyl: 06/07/13 Tamiflu:  finished course Cefepime 06/07/13 >> 06/13/13 Day 8/8 for HCAP and diverticulitis  Vancomycin 06/07/13 >> 06/13/13 Zosyn 06/13/13 >> Day 1 diverticulitis  Cultures show multiple organisms - Gram pos cocci, gram neg rods, gram pos  rods   Assessment/Plan  Diverticulitis with Abscess: Drain placed on 06/07/13 with symptom relief, Hub of drain broke thus was replaced on 06/11/13 by IR. Repeat CT tomorrow as planned vs later since still having feculent drainage.  Advance to full liquids.  D/c cefepime today for HCAP so switch to zosyn for diverticulitis. HCAP/Influenza: Vanc on hold, Cefipime Afib: Rate controlled, managed my Internal Medicine  COPD: Breathing treatments prn. IS not in room, RT to bring and educate pt.  Multiple Comorbidities: Continue home meds per Internal Medicine  VTE prophylaxis: On Heparin. Mobilize, IS. Leg Wound: Improved WOC recommended foam dressing.    LOS: 7 days    DORT, Aundra Millet 06/13/2013, 11:50 AM Pager: [phone]

## 2013-06-13 NOTE — Progress Notes (Addendum)
[facility] PROGRESS NOTE Assessment/Plan: Toxic encephalopathy due to infectious etiology: - Dur to diverticular abscess and influenza. Now resolved, no events overnight. - Continue IV abx's, supportive care. Tolerated liq diet with out pain or nausea. - PRN antiemetics and pain meds  - Reports abdominal pain has improved;  - Will need SNF.  Diverticular abscess:  - Status post drain drain replacement on 3.4.2015. - Afebrile for >48Hrs. - Continue current IV antibiotics (cefepime started on 2.27.2015) - Poor candidate for surgery, hopping can be avoided. Will follow general surgery rec's  - Increase narcotics.  HCAP/influenza:  - Completed HCAP and Influenza treatment therapy. - Continue oxygen supplementation, pulmicort, flutter valve and PRN nebulizers  - Breathing overall stable; and no further SOB   Atrial fibrillation: rate controlled.  - will continue heparin per pharmacy; resume coumadin.  COPD with chronic resp failure:  - Stable and actively no wheezing  - Will continue home inhalers  - Chronic prednisone and oxygen supplementation.  CKD: stable and at baseline. Will monitor  - Cr 1.7 and by GFR essentially at baseline   Chronic diastolic heart failure: compensated at this moment.  - Last EF 60-65%  - will continue demadex  - strict I's and O's, daily weight      Code Status: Full  Family Communication: no family at bedside  Disposition Plan: to be determine    Consultants:  [facility]  Procedures: See below for x-ray reports Abscess drain placement by IR (2/27) and replacement on (3/3)   Antibiotics: vanc and cefepime, vanc stop on 3.2.2015.  HPI/Subjective: Pain with out narcotics. Passing gas.  Objective: Filed Vitals:   06/12/13 1430 06/12/13 1940 06/12/13 1953 06/13/13 0541  BP: 134/60  112/68 120/59  Pulse: 94  91 75  Temp: 97.9 F (36.6 C)  98 F (36.7 C) 98.2 F (36.8 C)  TempSrc: Oral  Axillary Oral  Resp: 18  18 17     Height:      Weight:    80.3 kg (177 lb 0.5 oz)  SpO2: 96% 98% 98% 97%    Intake/Output Summary (Last 24 hours) at 06/13/13 0719 Last data filed at 06/12/13 1700  Gross per 24 hour  Intake    480 ml  Output      0 ml  Net    480 ml   Filed Weights   06/11/13 0500 06/12/13 0500 06/13/13 0541  Weight: 84.3 kg (185 lb 13.6 oz) 82.6 kg (182 lb 1.6 oz) 80.3 kg (177 lb 0.5 oz)    Exam:  General: Alert, awake, oriented x3, in no acute distress.  HEENT: No bruits, no goiter.  Heart: Regular rate and rhythm, without murmurs, rubs, gallops.  Lungs: Good air movement, clear    Data Reviewed: Basic Metabolic Panel:  Recent Labs Lab 06/06/13 2244 06/06/13 2333 06/07/13 0426 06/08/13 2341 06/09/13 0457 06/11/13 0327  NA 132* 135* 137  --  145 139  K 4.6 4.6 4.3  --  5.0 4.0  CL 89* 95* 97  --  106 101  CO2 25  --  25  --  23 25  GLUCOSE 194* 198* 160*  --  82 102*  BUN 62* 77* 57*  --  37* 30*  CREATININE 2.02* 2.10* 1.78*  --  1.34* 1.37*  CALCIUM 8.6  --  8.3*  --  8.6 8.9  MG  --   --   --  2.0  --   --    Liver Function Tests:  Recent Labs Lab 06/06/13 2244 06/07/13 0426  AST 48* 32  ALT 49* 38*  ALKPHOS 230* 201*  BILITOT 0.3 0.2*  PROT 6.7 6.2  ALBUMIN 2.7* 2.4*    Recent Labs Lab 06/06/13 2244  LIPASE 20   No results found for this basename: AMMONIA,  in the last 168 hours CBC:  Recent Labs Lab 06/06/13 2244 06/06/13 2333 06/07/13 0426 06/09/13 0457 06/12/13 0411  WBC 11.2*  --  8.6 6.3 11.9*  NEUTROABS 9.6*  --  6.7  --   --   HGB 11.4* 12.9 10.9* 10.8* 11.2*  HCT 36.6 38.0 34.9* 34.3* 35.8*  MCV 91.3  --  91.1 91.5 89.7  PLT 231  --  202 197 268   Cardiac Enzymes: No results found for this basename: CKTOTAL, CKMB, CKMBINDEX, TROPONINI,  in the last 168 hours BNP (last 3 results)  Recent Labs  07/25/12 0822 12/05/12 1558 06/06/13 2244  PROBNP 399.0* 277.0* 1764.0*   CBG:  Recent Labs Lab 06/12/13 0615 06/12/13 1125  06/12/13 1640 06/12/13 2113 06/13/13 0557  GLUCAP 114* 179* 196* 103* 71    Recent Results (from the past 240 hour(s))  URINE CULTURE     Status: None   Collection Time    06/06/13  8:25 PM      Result Value Ref Range Status   Specimen Description URINE, CATHETERIZED   Final   Special Requests ADDED [id number]   Final   Culture  Setup Time     Final   Value: 06/07/2013 01:19     Performed at [facility]   Colony Count     Final   Value: NO GROWTH     Performed at [facility]   Culture     Final   Value: NO GROWTH     Performed at [facility]   Report Status 06/07/2013 FINAL   Final  CULTURE, BLOOD (ROUTINE X 2)     Status: None   Collection Time    06/06/13 11:20 PM      Result Value Ref Range Status   Specimen Description BLOOD RIGHT WRIST   Final   Special Requests BOTTLES DRAWN AEROBIC AND ANAEROBIC 3CC   Final   Culture  Setup Time     Final   Value: 06/07/2013 03:27     Performed at [facility]   Culture     Final   Value:        BLOOD CULTURE RECEIVED NO GROWTH TO DATE CULTURE WILL BE HELD FOR 5 DAYS BEFORE ISSUING A FINAL NEGATIVE REPORT     Performed at [facility]   Report Status PENDING   Incomplete  CULTURE, BLOOD (ROUTINE X 2)     Status: None   Collection Time    06/06/13 11:30 PM      Result Value Ref Range Status   Specimen Description BLOOD RIGHT HAND   Final   Special Requests BOTTLES DRAWN AEROBIC AND ANAEROBIC 5CC   Final   Culture  Setup Time     Final   Value: 06/07/2013 03:27     Performed at [facility]   Culture     Final   Value:        BLOOD CULTURE RECEIVED NO GROWTH TO DATE CULTURE WILL BE HELD FOR 5 DAYS BEFORE ISSUING A FINAL NEGATIVE REPORT     Performed at [facility]   Report Status PENDING   Incomplete  MRSA  PCR SCREENING     Status: None   Collection Time    06/07/13  3:23 AM      Result Value Ref Range Status   MRSA by PCR NEGATIVE  NEGATIVE Final   Comment:             The GeneXpert MRSA Assay (FDA     approved for NASAL specimens     only), is one component of a     comprehensive MRSA colonization     surveillance program. It is not     intended to diagnose MRSA     infection nor to guide or     monitor treatment for     MRSA infections.  CULTURE, ROUTINE-ABSCESS     Status: None   Collection Time    06/07/13  6:48 PM      Result Value Ref Range Status   Specimen Description PERITONEAL CAVITY   Final   Special Requests DIVERTICULAR ABSCESS   Final   Gram Stain     Final   Value: MODERATE WBC PRESENT,BOTH PMN AND MONONUCLEAR     NO SQUAMOUS EPITHELIAL CELLS SEEN     RARE GRAM POSITIVE COCCI     RARE GRAM NEGATIVE RODS     RARE GRAM POSITIVE RODS   Culture     Final   Value: MULTIPLE ORGANISMS PRESENT, NONE PREDOMINANT     Note: NO STAPHYLOCOCCUS AUREUS ISOLATED NO GROUP A STREP (S.PYOGENES) ISOLATED     Performed at [facility]   Report Status 06/11/2013 FINAL   Final     Studies: Ir Catheter Tube Change  06/11/2013   CLINICAL DATA:  79 year old with a percutaneous abscess drain. The percutaneous drainage catheter is broken and needs replacement.  EXAM: EXCHANGE OF DRAINAGE CATHETER WITH FLUOROSCOPY  Physician: Rachelle Hora. Lowella Dandy, MD  MEDICATIONS: 1 mg versed, 50 mcg fentanyl. A radiology nurse monitored the patient for moderate sedation.  ANESTHESIA/SEDATION: Moderate sedation time: 12 min  FLUOROSCOPY TIME:  3 min and 6 seconds  PROCEDURE: The procedure was explained to the patient. The risks and benefits of the procedure were discussed and the patient's questions were addressed. Informed consent was obtained from the patient. Examination demonstrated that the hub of the catheter had been broken. Catheter was sutured in place and did not appear to be internally dislodged. The catheter was prepped and draped in a sterile fashion. Maximal barrier sterile technique was utilized including caps, mask, sterile gowns, sterile gloves, sterile drape,  hand hygiene and skin antiseptic. Contrast was injected through the catheter. The catheter suture was removed and the catheter was removed over a Bentson wire. A 5 French Kumpe catheter was advanced over the wire and additional contrast was injected. A new 10 French catheter was placed over the Bentson wire and reconstituted in the pelvis. Additional contrast was injected through the catheter. Catheter was flushed with saline. Catheter was sutured to the skin with Prolene.  COMPLICATIONS: None  FINDINGS: There is contrast within the distal colon. After removal of the catheter, the wire preferentially went into the colon. The wire was pulled out of the colon. A new 10 French catheter was placed outside of the colon. Contrast injection demonstrated a fistula connection to the sigmoid colon. Bloody purulent fluid was aspirated from the catheter.  IMPRESSION: Successful replacement of the left pelvic abscess drainage catheter.  There is fistula connection between the abscess collection and colon.   Electronically Signed   By: Meriel Pica.D.  On: 06/11/2013 14:19    Scheduled Meds: . albuterol  2.5 mg Nebulization BID  . bisoprolol  5 mg Oral Daily  . budesonide (PULMICORT) nebulizer solution  0.25 mg Nebulization BID  . ceFEPime (MAXIPIME) IV  1 g Intravenous Q24H  . guaiFENesin  600 mg Oral BID  . levothyroxine  50 mcg Oral QAC breakfast  . predniSONE  10 mg Oral Q breakfast  . tiotropium  18 mcg Inhalation Daily  . torsemide  20 mg Oral Daily  . Warfarin - Pharmacist Dosing Inpatient   Does not apply q1800   Continuous Infusions: . dextrose 5 % and 0.45% NaCl 10 mL/hr (06/10/13 1743)     Marinda Elk  [facility] Pager [phone]. If 8PM-8AM, please contact night-coverage at [email or web address], password [facility] 06/13/2013, 7:19 AM  LOS: 7 days

## 2013-06-13 NOTE — Progress Notes (Signed)
Subjective: Pt doing fairly well; sitting up in chair; daughter in room; has some mild soreness at LLQ drain site  Objective: Vital signs in last 24 hours: Temp:  [97.9 F (36.6 C)-98.2 F (36.8 C)] 98.2 F (36.8 C) (03/05 0541) Pulse Rate:  [75-94] 75 (03/05 0541) Resp:  [17-18] 17 (03/05 0541) BP: (112-134)/(59-68) 120/59 mmHg (03/05 0541) SpO2:  [96 %-98 %] 97 % (03/05 0958) Weight:  [177 lb 0.5 oz (80.3 kg)] 177 lb 0.5 oz (80.3 kg) (03/05 0541) Last BM Date: 06/12/13  Intake/Output from previous day: 03/04 0701 - 03/05 0700 In: 530 [P.O.:480; IV Piggyback:50] Out: -  Intake/Output this shift: Total I/O In: 120 [P.O.:120] Out: 330 [Urine:300; Drains:30]  LLQ drain intact, output 30 cc's thick feculent material; leakage of fluid noted around insertion site; drain irrigated with 5 cc's sterile NS without difficulty; site mildly tender to palpation; cx's- mult organisms  Lab Results:   Recent Labs  06/12/13 0411  WBC 11.9*  HGB 11.2*  HCT 35.8*  PLT 268   BMET  Recent Labs  06/11/13 0327  NA 139  K 4.0  CL 101  CO2 25  GLUCOSE 102*  BUN 30*  CREATININE 1.37*  CALCIUM 8.9   PT/INR No results found for this basename: LABPROT, INR,  in the last 72 hours ABG No results found for this basename: PHART, PCO2, PO2, HCO3,  in the last 72 hours  Studies/Results: No results found.  Anti-infectives: Anti-infectives   Start     Dose/Rate Route Frequency Ordered Stop   06/07/13 1800  cefTRIAXone (ROCEPHIN) 1 g in dextrose 5 % 50 mL IVPB  Status:  Discontinued     1 g 100 mL/hr over 30 Minutes Intravenous Every 24 hours 06/06/13 2134 06/07/13 0341   06/07/13 1600  azithromycin (ZITHROMAX) tablet 500 mg  Status:  Discontinued     500 mg Oral Every 24 hours 06/06/13 2134 06/07/13 0341   06/07/13 1145  oseltamivir (TAMIFLU) capsule 30 mg     30 mg Oral Daily 06/07/13 1133 06/11/13 1107   06/07/13 0600  ceFEPIme (MAXIPIME) 1 g in dextrose 5 % 50 mL IVPB  Status:   Discontinued     1 g 100 mL/hr over 30 Minutes Intravenous 3 times per day 06/07/13 0341 06/07/13 0353   06/07/13 0500  ceFEPIme (MAXIPIME) 1 g in dextrose 5 % 50 mL IVPB     1 g 100 mL/hr over 30 Minutes Intravenous Every 24 hours 06/07/13 0354 06/15/13 0459   06/07/13 0400  vancomycin (VANCOCIN) 1,250 mg in sodium chloride 0.9 % 250 mL IVPB  Status:  Discontinued     1,250 mg 166.7 mL/hr over 90 Minutes Intravenous Every 24 hours 06/07/13 0353 06/10/13 0508   06/07/13 0215  metroNIDAZOLE (FLAGYL) IVPB 500 mg     500 mg 100 mL/hr over 60 Minutes Intravenous  Once 06/07/13 0205 06/07/13 0302   06/06/13 2130  cefTRIAXone (ROCEPHIN) 1 g in dextrose 5 % 50 mL IVPB     1 g 100 mL/hr over 30 Minutes Intravenous  Once 06/06/13 2128 06/07/13 0004   06/06/13 2130  azithromycin (ZITHROMAX) 500 mg in dextrose 5 % 250 mL IVPB     500 mg 250 mL/hr over 60 Minutes Intravenous  Once 06/06/13 2128 06/07/13 0122      Assessment/Plan: s/p LLQ divert abscess drainage 2/27, exchange of drain 3/3; fistula present between abscess cavity and colon; will need to be diligent about drain irrigation to minimize leakage around  drain site and reduce thickness of fluid; daily dressing changes should be performed; other plans as per [facility]CS  LOS: 7 days    ALLRED,D [facility] HospitalKEVIN 06/13/2013

## 2013-06-13 NOTE — Clinical Social Work Psychosocial (Signed)
Clinical Social Work Department BRIEF PSYCHOSOCIAL ASSESSMENT 06/13/2013  Patient:  [name], [name]     Account Number:  [id number]     [place] date:  06/06/2013  Clinical Social Worker:  Iona Coach  Date/Time:  06/13/2013 03:16 PM  Referred by:  Physician  Date Referred:  06/12/2013 Referred for  SNF Placement   Other Referral:   Interview type:  Family Other interview type:   Patient and daughter Radio producer    PSYCHOSOCIAL DATA Living Status:  [place] Admitted from facility:   Level of care:   Primary support name:  Ayanna Gheen  [phone] Primary support relationship to patient:  CHILD, ADULT Degree of support available:   Strong support    Daughter- KarrJohnson- very supportive    CURRENT CONCERNS Current Concerns  Other - See comment   Other Concerns:   MD feels very strongly that patient nees SNF placement.  PT recommends HHPT.  Pt. refuses SNF and plans to return home at d/c.    [place] / PLAN CSW referred per MD to talk about short term SNF as he feels that patient is not safe to return home. Will will require a long term drain in place at d/c and has wound care issues.  CSW met today with patient and her daughter [name]. CSW attempted to talk to patient about MD's preference for SNF placement. Patient ADAMANTLY refuses SNF placement and states she is going home. She stated that she has been in a SNF in the past but did not have a postive outcome.  "I'm not going and I don't want to talk about this any further."  Patient's daughter [name] also supports home with home health d/c plan. Patient resides with her son [name] and he is home most of the time. Should he need to leave the house- daughter states there is plent of family that could easily stay with patient. She agrees to having home health PT, RN and an aide. Patient is a current resident with [facility] and states that she has all DME in place- RW, BSC, lift chair ect.  She  feels that she will manage fine at home and requested that CSW not discuss SNF any furhter.  She is alert and oriented. Daughter stated that they feel they will manage fine with patient at home; her daughter-in-law has been providing patient's wound care. Patient states she has 6 steps to get into her home. CSW stated would notify PT of this- patient did not want CSW to do this as she felt she would not have any problem with the steps with her family's assistance. CSW notified [place] of above.  CSW also notified MD; will sign off.   Assessment/plan status:  No Further Intervention Required Other assessment/ plan:   Information/referral to community resources:   RNCM will arrange [facility]    PATIENTS/FAMILYS RESPONSE TO PLAN OF CARE: Patient is alert and oriented. She because visibly upset when CSW attempted to talk to her about short term SNF and at first appeared to want CSW to leave her room.  "I AM NOT GOING TO A NURSING HOME AND I WILL NOT DISCUSS THIS FURTHER!"  She related that she has been placed in the past but did not have a good outcome. States "I'm never going back to one."  CSw provided support and encouragement; reminded patient that she had the right to self-determination at which time she became more relaxed and more cooperative with CSW.  Daughter fully supports return home and stated that the family did not want SNF placement for patient either as they are able to provide 24 hour care at home.  CSW signing off.  Lorie Phenix. [place], [place]

## 2013-06-13 NOTE — Progress Notes (Addendum)
ANTICOAGULATION and ANTIBIOTIC CONSULT NOTE - Initial Consult  Pharmacy Consult for Coumadin Indication: atrial fibrillation  Pharmacy Consult for Zosyn Indication: Diverticulitis  Allergies  Allergen Reactions  . Sulfonamide Derivatives Other (See Comments)    Childhood reaction    Patient Measurements: Height: 5\' 3"  (160 cm) Weight: 177 lb 0.5 oz (80.3 kg) IBW/kg (Calculated) : 52.4 Heparin Dosing Weight: n/a  Vital Signs: Temp: 98.2 F (36.8 C) (03/05 0541) Temp src: Oral (03/05 0541) BP: 120/59 mmHg (03/05 0541) Pulse Rate: 75 (03/05 0541)  Labs:  Recent Labs  06/11/13 0327 06/12/13 0411 06/13/13 1005  HGB  --  11.2*  --   HCT  --  35.8*  --   PLT  --  268  --   LABPROT  --   --  15.5*  INR  --   --  1.26  CREATININE 1.37*  --   --     Estimated Creatinine Clearance: 32.9 ml/min (by C-G formula based on Cr of 1.37).   Medical History: Past Medical History  Diagnosis Date  . Diabetes mellitus   . Obesity   . COPD (chronic obstructive pulmonary disease)     Moderate. PFTs (12/10): FVC 67%, FEV1 58%, ratio 60%, TLC 95%, RV 138%, DLCO 43% (moderate obstructive defect). She is on home oxygen that should be worn at all times.  . Diastolic CHF, chronic     Echo (3/12): EF 55-60%, mild LV hypertrophy, grade I diastolic dysfunction, mild left atrial enlargement, normal pulmonary artery pressure  . CKD (chronic kidney disease)     Cr 2.4 when last checked. ANCA +, pauci-immune glomerulnephritis followed by Dr. Arrie Aranoladonato  . PNA (pneumonia)     h/o with ARDS in 2005; MRSA P[facility]  . Diverticulum     Jejunal  . OSA (obstructive sleep apnea)     possible. When pt was sedated for TEE in May 2010, she did develop some upper airway obstruction  . Anemia     of chronic disease  . Hypothyroidism   . Gout   . Atrial fibrillation 07/2008    Failed cardioversion with TEE guidance in May 2010. Pt went back to NSR by  11/2008 with amiodarne  . Hypertension   . GERD (gastroesophageal reflux disease)   . Peripheral vascular disease   . Pneumonia   . Chronic kidney disease   . Respiratory failure, chronic 07/01/2011  . Chronic gout due to renal impairment 07/01/2011    Medications:  Prescriptions prior to admission  Medication Sig Dispense Refill  . albuterol (PROVENTIL) (2.5 MG/3ML) 0.083% nebulizer solution Take 3 mLs (2.5 mg total) by nebulization 2 (two) times daily. Dx: 492.8 COPD with Emphysema  270 mL  5  . bisoprolol (ZEBETA) 10 MG tablet Take 5 mg by mouth daily.      Marland Kitchen. guaifenesin (HUMIBID E) 400 MG TABS Take 400 mg by mouth 2 (two) times daily.       Marland Kitchen. levothyroxine (SYNTHROID, LEVOTHROID) 50 MCG tablet Take 50 mcg by mouth daily.      . Multiple Vitamin (MULITIVITAMIN WITH MINERALS) TABS Take 1 tablet by mouth daily.      . potassium chloride SA (K-DUR,KLOR-CON) 20 MEQ tablet Take 20 mEq by mouth daily.      . predniSONE (DELTASONE) 10 MG tablet Take 10 mg by mouth daily with breakfast.      . ranitidine (ZANTAC) 150 MG capsule Take 150 mg by mouth every evening.        .Marland Kitchen  tiotropium (SPIRIVA HANDIHALER) 18 MCG inhalation capsule Place 1 capsule (18 mcg total) into inhaler and inhale daily.  30 capsule  11  . torsemide (DEMADEX) 20 MG tablet Take 80 mg by mouth daily.      . traMADol (ULTRAM) 50 MG tablet Take 0.5 tablets (25 mg total) by mouth every 12 (twelve) hours as needed for moderate pain.  10 tablet  0  . warfarin (COUMADIN) 2 MG tablet Take 2 mg by mouth daily at 6 PM.        Assessment: 67 YOF admitted for diverticulitis to restart her coumadin which was held till 2 days ago in case patient needed surgery to drain her abscess. She received a 4 mg dose of coumadin yesterday and her INR today was 1.26. All other labs as noted above. Home coumadin dose was 2 mg daily. She was transitioned from flagyl and Cefepime to Zosyn today for diverticulitis. Vanc random today remained  supra-therapeutic at 21. Will plan to re-dose tomorrow.  Goal of Therapy:  INR 2-3 Monitor platelets by anticoagulation protocol: Yes   Plan:  Repeat coumadin 4 mg x 1 dose  Zosyn 3.375 gm IV Q 8 hours  Hold Vancomycin today  Monitor daily INR and s/s of bleeding   Vinnie Level, PharmD.  Clinical Pharmacist Pager [phone]

## 2013-06-13 NOTE — Progress Notes (Signed)
Physical Therapy Treatment Patient Details Name: Heidi HanlonCynthia A Burch MRN: [id number] DOB: 04/09/1931 Today's Date: 06/13/2013 Time: [id number] PT Time Calculation (min): 26 min  PT Assessment / Plan / Recommendation  History of Present Illness [name] is a 80 year old woman with PMH of GERD, dCHF, CKD, hyperlipidemia, COPD, a-fib, PVD, hypothyroidism, diverticulosis, and multiple episodes of C diff who presents with a one-day history of fever, chills, weakness, and vomiting x1. She woke up this morning feeling cold and weak, and vomited once after having a few sips of water. Her daughter checked her temperature and found it was 101.4. She usually gets around with a walker, but she felt too weak to use it so her daughter had to help her into her wheelchair to come to the ED   PT Comments   Pt incontinent of diarrhea on arrival and [facility] to complete voiding and assist for pericare. Pt with very limited activity tolerance today and not able to ambulate. Unsure if pt will be able to meet goals for safe DC and potentially should reconsider need for ST-SNF prior to home pending LOS. Will continue to follow to maximize function. Pt max assist with pad for reciprocal scooting into chair. Pt fatigued end of session. Pt noted to have reddened buttocks and NT notified as well as pt.   Follow Up Recommendations  Home health PT;Supervision/Assistance - 24 hour;SNF     Does the patient have the potential to tolerate intense rehabilitation     Barriers to Discharge        Equipment Recommendations       Recommendations for Other Services    Frequency     Progress towards PT Goals Progress towards PT goals: Not progressing toward goals - comment (very limited progression)  Plan Current plan remains appropriate    Precautions / Restrictions Precautions Precautions: Fall Precaution Comments: pt with L flank drain, incontinent of stool   Pertinent Vitals/Pain On 1.5L throughout VSS No  pain    Mobility  Bed Mobility Overal bed mobility: Needs Assistance Bed Mobility: Supine to Sit Supine to sit: Mod assist General bed mobility comments: mod assist with rail and use of pad to scoot hips to EOB and assist bil LE off bed, min assist to elevate trunk from surface, max cueing throughout Transfers Overall transfer level: Needs assistance Equipment used: Rolling walker (2 wheeled) Transfers: Sit to/from [employer];Stand Pivot Transfers Sit to Stand: Mod assist Stand pivot transfers: Mod assist General transfer comment: stand x 3 from bed, BSC and chair with cues for hand placement, safety and sequence pt began turning [facility]degrees [facility] to chair and began to fatigue requiring significant assist to shift pelvis over to chair to safely land in chair. Pt unable to tolerate gait today    Exercises General Exercises - Lower Extremity Long Arc Quad: AROM;Seated;Both;15 reps Hip Flexion/Marching: AROM;Seated;10 reps;Both   PT Diagnosis:    PT Problem List:   PT Treatment Interventions:     PT Goals (current goals can now be found in the care plan section)    Visit Information  Last PT Received On: 06/13/13 Assistance Needed: +1 History of Present Illness: Ms. Heidi CarwinCynthia Richison is a 80 year old woman with PMH of GERD, dCHF, CKD, hyperlipidemia, COPD, a-fib, PVD, hypothyroidism, diverticulosis, and multiple episodes of C diff who presents with a one-day history of fever, chills, weakness, and vomiting x1. She woke up this morning feeling cold and weak, and vomited once after having a few sips of water. Her  daughter checked her temperature and found it was 101.4. She usually gets around with a walker, but she felt too weak to use it so her daughter had to help her into her wheelchair to come to the ED    Subjective Data      Cognition  Cognition Arousal/Alertness: Awake/alert Behavior During Therapy: WFL for tasks assessed/performed Overall Cognitive Status: Within Functional  Limits for tasks assessed    Balance     End of Session PT - End of Session Equipment Utilized During Treatment: Gait belt;Oxygen Activity Tolerance: Patient limited by fatigue Patient left: in chair;with call bell/phone within reach;with nursing/sitter in room Nurse Communication: Mobility status   GP     [name] [facility] 06/13/2013, 10:10 AM [name], PT [phone]

## 2013-06-14 ENCOUNTER — Inpatient Hospital Stay (HOSPITAL_COMMUNITY): Payer: Medicare Other

## 2013-06-14 DIAGNOSIS — R5381 Other malaise: Secondary | ICD-10-CM

## 2013-06-14 LAB — CBC
HCT: 36.1 % (ref 36.0–46.0)
Hemoglobin: 11.2 g/dL — ABNORMAL LOW (ref 12.0–15.0)
MCH: 27.9 pg (ref 26.0–34.0)
MCHC: 31 g/dL (ref 30.0–36.0)
MCV: 90 fL (ref 78.0–100.0)
Platelets: 298 10*3/uL (ref 150–400)
RBC: 4.01 MIL/uL (ref 3.87–5.11)
RDW: 15.3 % (ref 11.5–15.5)
WBC: 10.5 10*3/uL (ref 4.0–10.5)

## 2013-06-14 LAB — BASIC METABOLIC PANEL
BUN: 41 mg/dL — ABNORMAL HIGH (ref 6–23)
CO2: 25 mEq/L (ref 19–32)
Calcium: 9 mg/dL (ref 8.4–10.5)
Chloride: 99 mEq/L (ref 96–112)
Creatinine, Ser: 1.73 mg/dL — ABNORMAL HIGH (ref 0.50–1.10)
GFR calc Af Amer: 31 mL/min — ABNORMAL LOW (ref >90)
GFR calc non Af Amer: 26 mL/min — ABNORMAL LOW (ref >90)
Glucose, Bld: 161 mg/dL — ABNORMAL HIGH (ref 70–99)
Potassium: 5.5 mEq/L — ABNORMAL HIGH (ref 3.7–5.3)
Sodium: 139 mEq/L (ref 137–147)

## 2013-06-14 LAB — GLUCOSE, CAPILLARY
Glucose-Capillary: 109 mg/dL — ABNORMAL HIGH (ref 70–99)
Glucose-Capillary: 68 mg/dL — ABNORMAL LOW (ref 70–99)
Glucose-Capillary: 149 mg/dL — ABNORMAL HIGH (ref 70–99)

## 2013-06-14 LAB — PROTIME-INR
INR: 1.97 — ABNORMAL HIGH (ref 0.00–1.49)
Prothrombin Time: 21.8 seconds — ABNORMAL HIGH (ref 11.6–15.2)

## 2013-06-14 MED ORDER — PRO-STAT SUGAR FREE PO LIQD
30.0000 mL | ORAL | Status: DC
Start: 2013-06-14 — End: 2013-06-20
  Administered 2013-06-14 – 2013-06-20 (×7): 30 mL via ORAL
  Filled 2013-06-14 (×8): qty 30

## 2013-06-14 MED ORDER — ADULT MULTIVITAMIN W/MINERALS CH
1.0000 | ORAL_TABLET | Freq: Every day | ORAL | Status: DC
  Administered 2013-06-14 – 2013-06-20 (×7): 1 via ORAL
  Filled 2013-06-14 (×8): qty 1

## 2013-06-14 MED ORDER — GLUCERNA SHAKE PO LIQD
237.0000 mL | Freq: Two times a day (BID) | ORAL | Status: DC
  Administered 2013-06-17 – 2013-06-20 (×7): 237 mL via ORAL

## 2013-06-14 MED ORDER — VANCOMYCIN HCL IN DEXTROSE 750-5 MG/150ML-% IV SOLN
750.0000 mg | Freq: Once | INTRAVENOUS | Status: DC
  Filled 2013-06-14: qty 150

## 2013-06-14 MED ORDER — SODIUM POLYSTYRENE SULFONATE 15 GM/60ML PO SUSP
15.0000 g | Freq: Once | ORAL | Status: AC
  Administered 2013-06-14: 15 g via ORAL
  Filled 2013-06-14: qty 60

## 2013-06-14 MED ORDER — IOHEXOL 300 MG/ML  SOLN
25.0000 mL | INTRAMUSCULAR | Status: AC
  Administered 2013-06-14 (×2): 25 mL via ORAL

## 2013-06-14 MED ORDER — WARFARIN SODIUM 2 MG PO TABS
2.0000 mg | ORAL_TABLET | Freq: Once | ORAL | Status: AC
Start: 2013-06-14 — End: 2013-06-14
  Administered 2013-06-14: 2 mg via ORAL
  Filled 2013-06-14: qty 1

## 2013-06-14 NOTE — Progress Notes (Signed)
Patient ID: [name], female   DOB: 04-17-1930, 80 y.o.   MRN: [id number]    Subjective: Pt doesn't feel well today.  Increasing LLQ abdominal pain this morning.  Doesn't want much to eat  Objective: Vital signs in last 24 hours: Temp:  [98 F (36.7 C)-98.6 F (37 C)] 98.3 F (36.8 C) (03/06 1009) Pulse Rate:  [75-88] 75 (03/06 0632) Resp:  [17-18] 18 (03/06 1009) BP: (94-146)/(49-69) 124/69 mmHg (03/06 1009) SpO2:  [95 %-100 %] 95 % (03/06 1009) Weight:  [177 lb 14.6 oz (80.7 kg)] 177 lb 14.6 oz (80.7 kg) (03/06 4098) Last BM Date: 06/13/13  Intake/Output from previous day: 03/05 0701 - 03/06 0700 In: 960 [P.O.:960] Out: 871 [Urine:800; Drains:70; Stool:1] Intake/Output this shift: Total I/O In: 180 [P.O.:180] Out: -   PE: Abd: soft, tender in LLQ, some BS, obese, drain with feculent output.  Lab Results:   Recent Labs  06/12/13 0411  WBC 11.9*  HGB 11.2*  HCT 35.8*  PLT 268   BMET No results found for this basename: NA, K, CL, CO2, GLUCOSE, BUN, CREATININE, CALCIUM,  in the last 72 hours PT/INR  Recent Labs  06/13/13 1005 06/14/13 0416  LABPROT 15.5* 21.8*  INR 1.26 1.97*   CMP     Component Value Date/Time   NA 139 06/11/2013 0327   K 4.0 06/11/2013 0327   CL 101 06/11/2013 0327   CO2 25 06/11/2013 0327   GLUCOSE 102* 06/11/2013 0327   BUN 30* 06/11/2013 0327   CREATININE 1.37* 06/11/2013 0327   CREATININE 1.90* 03/23/2012 1621   CALCIUM 8.9 06/11/2013 0327   PROT 6.2 06/07/2013 0426   ALBUMIN 2.4* 06/07/2013 0426   AST 32 06/07/2013 0426   ALT 38* 06/07/2013 0426   ALKPHOS 201* 06/07/2013 0426   BILITOT 0.2* 06/07/2013 0426   GFRNONAA 35* 06/11/2013 0327   GFRAA 41* 06/11/2013 0327   Lipase     Component Value Date/Time   LIPASE 20 06/06/2013 2244       Studies/Results: No results found.  Anti-infectives: Anti-infectives   Start     Dose/Rate Route Frequency Ordered Stop   06/14/13 1000  vancomycin (VANCOCIN) IVPB 750 mg/150 ml premix  Status:   Discontinued     750 mg 150 mL/hr over 60 Minutes Intravenous  Once 06/14/13 0847 06/14/13 0848   06/13/13 1300  metroNIDAZOLE (FLAGYL) IVPB 500 mg  Status:  Discontinued     500 mg 100 mL/hr over 60 Minutes Intravenous Every 6 hours 06/13/13 1219 06/13/13 1238   06/13/13 1245  piperacillin-tazo (ZOSYN) NICU IV syringe 200 mg/mL  Status:  Discontinued     75 mg/kg  80.3 kg 60.2 mL/hr over 30 Minutes Intravenous Every 8 hours 06/13/13 1237 06/13/13 1243   06/13/13 1245  piperacillin-tazobactam (ZOSYN) IVPB 3.375 g     3.375 g 12.5 mL/hr over 240 Minutes Intravenous 3 times per day 06/13/13 1244     06/07/13 1800  cefTRIAXone (ROCEPHIN) 1 g in dextrose 5 % 50 mL IVPB  Status:  Discontinued     1 g 100 mL/hr over 30 Minutes Intravenous Every 24 hours 06/06/13 2134 06/07/13 0341   06/07/13 1600  azithromycin (ZITHROMAX) tablet 500 mg  Status:  Discontinued     500 mg Oral Every 24 hours 06/06/13 2134 06/07/13 0341   06/07/13 1145  oseltamivir (TAMIFLU) capsule 30 mg     30 mg Oral Daily 06/07/13 1133 06/11/13 1107   06/07/13 0600  ceFEPIme (MAXIPIME) 1  g in dextrose 5 % 50 mL IVPB  Status:  Discontinued     1 g 100 mL/hr over 30 Minutes Intravenous 3 times per day 06/07/13 0341 06/07/13 0353   06/07/13 0500  ceFEPIme (MAXIPIME) 1 g in dextrose 5 % 50 mL IVPB  Status:  Discontinued     1 g 100 mL/hr over 30 Minutes Intravenous Every 24 hours 06/07/13 0354 06/13/13 1237   06/07/13 0400  vancomycin (VANCOCIN) 1,250 mg in sodium chloride 0.9 % 250 mL IVPB  Status:  Discontinued     1,250 mg 166.7 mL/hr over 90 Minutes Intravenous Every 24 hours 06/07/13 0353 06/10/13 0508   06/07/13 0215  metroNIDAZOLE (FLAGYL) IVPB 500 mg     500 mg 100 mL/hr over 60 Minutes Intravenous  Once 06/07/13 0205 06/07/13 0302   06/06/13 2130  cefTRIAXone (ROCEPHIN) 1 g in dextrose 5 % 50 mL IVPB     1 g 100 mL/hr over 30 Minutes Intravenous  Once 06/06/13 2128 06/07/13 0004   06/06/13 2130  azithromycin  (ZITHROMAX) 500 mg in dextrose 5 % 250 mL IVPB     500 mg 250 mL/hr over 60 Minutes Intravenous  Once 06/06/13 2128 06/07/13 0122       Assessment/Plan  1. Diverticulitis with abscess and colonic fistula 2. A fib on coumadin 3. Flu  Plan: 1. Will repeat CT scan today to follow up on abscess cavity and progress in overall diverticular disease. 2. Do not advance diet right now given increase in abdominal pain. 3. Cont IV zosyn. 4. Await CT scan results   LOS: 8 days    Shandricka Monroy E 06/14/2013, 10:20 AM Pager: [phone]

## 2013-06-14 NOTE — Progress Notes (Signed)
[facility] PROGRESS NOTE Assessment/Plan: Toxic encephalopathy due to infectious etiology: - Due to diverticular abscess and influenza. Now resolved, no events overnight. - Tolerated liq diet with out pain or nausea. - PRN antiemetics and pain meds  - Will need SNF, pt refused.  Diverticular abscess:  - Status post drain drain replacement on 3.4.2015. - Afebrile for >48Hrs. - Continue current IV antibiotics (zosyn 3.6.2015) - Poor candidate for surgery, hopping can be avoided. Will follow general surgery rec's  - Ct abd and pelvis pending.  HCAP/influenza:  - Completed HCAP and Influenza treatment therapy. - Continue oxygen supplementation, pulmicort, flutter valve and PRN nebulizers  - Breathing overall stable; and no further SOB   Atrial fibrillation: rate controlled.  - will continue heparin per pharmacy; cont. coumadin.  COPD with chronic resp failure:  - Stable and actively no wheezing  - Will continue home inhalers  - Chronic prednisone and oxygen supplementation.  CKD: stable and at baseline. Will monitor  - Cr 1.7 and by GFR essentially at baseline   Chronic diastolic heart failure: compensated at this moment.  - Last EF 60-65%  - will continue demadex  - strict I's and O's, daily weight      Code Status: Full  Family Communication: no family at bedside  Disposition Plan: to be determine    Consultants:  Surgery  Procedures: See below for x-ray reports Abscess drain placement by IR (2/27) and replacement on (3/3)   Antibiotics: vanc and cefepime, vanc stop on 3.2.2015.  HPI/Subjective: Pain controlled with out narcotics. Passing gas.  Objective: Filed Vitals:   06/13/13 1500 06/13/13 2038 06/13/13 2248 06/14/13 0632  BP: 116/58 146/68  94/49  Pulse: 77 88  75  Temp: 98 F (36.7 C) 98.6 F (37 C)  98.2 F (36.8 C)  TempSrc: Oral Oral  Oral  Resp: 18 18  17   Height:      Weight:    80.7 kg (177 lb 14.6 oz)  SpO2: 97% 100% 100%  98%    Intake/Output Summary (Last 24 hours) at 06/14/13 0751 Last data filed at 06/14/13 0718  Gross per 24 hour  Intake    960 ml  Output   1641 ml  Net   -681 ml   Filed Weights   06/12/13 0500 06/13/13 0541 06/14/13 [id number]  Weight: 82.6 kg (182 lb 1.6 oz) 80.3 kg (177 lb 0.5 oz) 80.7 kg (177 lb 14.6 oz)    Exam:  General: Alert, awake, oriented x3, in no acute distress.  HEENT: No bruits, no goiter.  Heart: Regular rate and rhythm, without murmurs, rubs, gallops.  Lungs: Good air movement, clear    Data Reviewed: Basic Metabolic Panel:  Recent Labs Lab 06/08/13 2341 06/09/13 0457 06/11/13 0327  NA  --  145 139  K  --  5.0 4.0  CL  --  106 101  CO2  --  23 25  GLUCOSE  --  82 102*  BUN  --  37* 30*  CREATININE  --  1.34* 1.37*  CALCIUM  --  8.6 8.9  MG 2.0  --   --    Liver Function Tests: No results found for this basename: AST, ALT, ALKPHOS, BILITOT, PROT, ALBUMIN,  in the last 168 hours No results found for this basename: LIPASE, AMYLASE,  in the last 168 hours No results found for this basename: AMMONIA,  in the last 168 hours CBC:  Recent Labs Lab 06/09/13 0457 06/12/13 0411  WBC 6.3  11.9*  HGB 10.8* 11.2*  HCT 34.3* 35.8*  MCV 91.5 89.7  PLT 197 268   Cardiac Enzymes: No results found for this basename: CKTOTAL, CKMB, CKMBINDEX, TROPONINI,  in the last 168 hours BNP (last 3 results)  Recent Labs  07/25/12 0822 12/05/12 1558 06/06/13 2244  PROBNP 399.0* 277.0* 1764.0*   CBG:  Recent Labs Lab 06/13/13 1116 06/13/13 1638 06/13/13 2136 06/14/13 0631 06/14/13 0722  GLUCAP 155* 132* 109* 68* 109*    Recent Results (from the past 240 hour(s))  URINE CULTURE     Status: None   Collection Time    06/06/13  8:25 PM      Result Value Ref Range Status   Specimen Description URINE, CATHETERIZED   Final   Special Requests ADDED [id number]   Final   Culture  Setup Time     Final   Value: 06/07/2013 01:19     Performed at [facility]   Colony Count     Final   Value: NO GROWTH     Performed at [facility]   Culture     Final   Value: NO GROWTH     Performed at [facility]   Report Status 06/07/2013 FINAL   Final  CULTURE, BLOOD (ROUTINE X 2)     Status: None   Collection Time    06/06/13 11:20 PM      Result Value Ref Range Status   Specimen Description BLOOD RIGHT WRIST   Final   Special Requests BOTTLES DRAWN AEROBIC AND ANAEROBIC 3CC   Final   Culture  Setup Time     Final   Value: 06/07/2013 03:27     Performed at [facility]   Culture     Final   Value: NO GROWTH 5 DAYS     Performed at [facility]   Report Status 06/13/2013 FINAL   Final  CULTURE, BLOOD (ROUTINE X 2)     Status: None   Collection Time    06/06/13 11:30 PM      Result Value Ref Range Status   Specimen Description BLOOD RIGHT HAND   Final   Special Requests BOTTLES DRAWN AEROBIC AND ANAEROBIC 5CC   Final   Culture  Setup Time     Final   Value: 06/07/2013 03:27     Performed at [facility]   Culture     Final   Value: NO GROWTH 5 DAYS     Performed at [facility]   Report Status 06/13/2013 FINAL   Final  MRSA PCR SCREENING     Status: None   Collection Time    06/07/13  3:23 AM      Result Value Ref Range Status   MRSA by PCR NEGATIVE  NEGATIVE Final   Comment:            The GeneXpert MRSA Assay (FDA     approved for NASAL specimens     only), is one component of a     comprehensive MRSA colonization     surveillance program. It is not     intended to diagnose MRSA     infection nor to guide or     monitor treatment for     MRSA infections.  CULTURE, ROUTINE-ABSCESS     Status: None   Collection Time    06/07/13  6:48 PM      Result Value Ref Range Status  Specimen Description PERITONEAL CAVITY   Final   Special Requests DIVERTICULAR ABSCESS   Final   Gram Stain     Final   Value: MODERATE WBC PRESENT,BOTH PMN AND MONONUCLEAR     NO SQUAMOUS EPITHELIAL  CELLS SEEN     RARE GRAM POSITIVE COCCI     RARE GRAM NEGATIVE RODS     RARE GRAM POSITIVE RODS   Culture     Final   Value: MULTIPLE ORGANISMS PRESENT, NONE PREDOMINANT     Note: NO STAPHYLOCOCCUS AUREUS ISOLATED NO GROUP A STREP (S.PYOGENES) ISOLATED     Performed at [facility]   Report Status 06/11/2013 FINAL   Final  CLOSTRIDIUM DIFFICILE BY PCR     Status: None   Collection Time    06/13/13  1:58 PM      Result Value Ref Range Status   C difficile by pcr NEGATIVE  NEGATIVE Final     Studies: No results found.  Scheduled Meds: . albuterol  2.5 mg Nebulization BID  . bisoprolol  5 mg Oral Daily  . budesonide (PULMICORT) nebulizer solution  0.25 mg Nebulization BID  . guaiFENesin  600 mg Oral BID  . levothyroxine  50 mcg Oral QAC breakfast  . piperacillin-tazobactam (ZOSYN)  IV  3.375 g Intravenous 3 times per day  . predniSONE  10 mg Oral Q breakfast  . tiotropium  18 mcg Inhalation Daily  . torsemide  20 mg Oral Daily  . Warfarin - Pharmacist Dosing Inpatient   Does not apply q1800   Continuous Infusions: . dextrose 5 % and 0.45% NaCl 10 mL/hr (06/10/13 1743)     Marinda Elk  [facility] Pager [phone]. If 8PM-8AM, please contact night-coverage at [email or web address], password [facility] 06/14/2013, 7:51 AM  LOS: 8 days

## 2013-06-14 NOTE — Progress Notes (Signed)
Physical Therapy Treatment Patient Details Name: [name] MRN: [id number] DOB: 30-Jan-1931 Today's Date: 06/14/2013 Time: 2751-7001 PT Time Calculation (min): 30 min  PT Assessment / Plan / Recommendation  History of Present Illness [name] is a 80 year old woman with PMH of GERD, dCHF, CKD, hyperlipidemia, COPD, a-fib, PVD, hypothyroidism, diverticulosis, and multiple episodes of C diff who presents with a one-day history of fever, chills, weakness, and vomiting x1. She woke up this morning feeling cold and weak, and vomited once after having a few sips of water. Her daughter checked her temperature and found it was 101.4. She usually gets around with a walker, but she felt too weak to use it so her daughter had to help her into her wheelchair to come to the ED   PT Comments   Discussed DC plan with pt and dgtr. Pt still adamant about not going to SNF although dgtr seems to understand benefit and recommendation. Pt still unable to enter home up stairs and will require ambulance transport home if to leave prior to this goal being met. Pt with baseline of only ambulating 30' and that is family goal and they stated understanding of needing to have 24hr assist acutely. Pt encouraged to perform repeated sit to stand and bil LE HEP daily. Will continue to follow.   Follow Up Recommendations  Home health PT;Supervision/Assistance - 24 hour;SNF (pt refuses SNF), will need ambulance transport home     Does the patient have the potential to tolerate intense rehabilitation     Barriers to Discharge        Equipment Recommendations       Recommendations for Other Services    Frequency     Progress towards PT Goals Progress towards PT goals: Progressing toward goals (very slowly)  Plan Current plan remains appropriate    Precautions / Restrictions Precautions Precautions: Fall Precaution Comments: pt with L flank drain, incontinent of stool   Pertinent Vitals/Pain Sore at drain  site On 2L throughout VSS   Mobility  Transfers Overall transfer level: Needs assistance Transfers: Sit to/from Stand Sit to Stand: Min assist General transfer comment: cueing for hand placement and safety Ambulation/Gait Ambulation/Gait assistance: Min assist Ambulation Distance (Feet): 10 Feet Assistive device: Rolling walker (2 wheeled) Gait Pattern/deviations: Step-through pattern;Decreased stride length;Trunk flexed;Shuffle Gait velocity interpretation: <1.8 ft/sec, indicative of risk for recurrent falls General Gait Details: pt felt like she could walk to door and back but near door fatigued and needed chair pulled to her. Slow shuffle gait    Exercises General Exercises - Lower Extremity Long Arc Quad: AROM;Seated;Both;20 reps Hip Flexion/Marching: AROM;Seated;Both;20 reps   PT Diagnosis:    PT Problem List:   PT Treatment Interventions:     PT Goals (current goals can now be found in the care plan section)    Visit Information  Last PT Received On: 06/14/13 Assistance Needed: +1 Reason Eval/Treat Not Completed: Patient declined, no reason specified History of Present Illness: [name] is a 80 year old woman with PMH of GERD, dCHF, CKD, hyperlipidemia, COPD, a-fib, PVD, hypothyroidism, diverticulosis, and multiple episodes of C diff who presents with a one-day history of fever, chills, weakness, and vomiting x1. She woke up this morning feeling cold and weak, and vomited once after having a few sips of water. Her daughter checked her temperature and found it was 101.4. She usually gets around with a walker, but she felt too weak to use it so her daughter had to help her into her  wheelchair to come to the ED    Subjective Data      Cognition  Cognition Arousal/Alertness: Awake/alert Behavior During Therapy: WFL for tasks assessed/performed Overall Cognitive Status: Within Functional Limits for tasks assessed    Balance     End of Session PT - End of  Session Equipment Utilized During Treatment: Oxygen Activity Tolerance: Patient limited by fatigue Patient left: in chair;with call bell/phone within reach;with family/visitor present   GP     Tabor, Heidi Beth 06/14/2013, 1:14 PM Heidi Tabor Burch, PT [phone]   

## 2013-06-14 NOTE — Progress Notes (Signed)
Patient interviewed and examined, agree with PA note above. She is again feeling better this afternoon as I examined her. Abdomen seems benign. Agree with repeat CT.  Mariella SaaBenjamin T Odean Mcelwain MD, FACS  06/14/2013 6:03 PM

## 2013-06-14 NOTE — Progress Notes (Signed)
Hypoglycemic Event  CBG: 68  Treatment: 15 GM carbohydrate snack  Symptoms: None  Follow-up CBG: Time:0715 CBG Result:109  Possible Reasons for Event: Inadequate meal intake  Comments/MD notified:none     [name], [name]  Remember to initiate Hypoglycemia Order Set & complete

## 2013-06-14 NOTE — Progress Notes (Signed)
ANTICOAGULATION CONSULT NOTE - Initial Consult  Pharmacy Consult for Coumadin Indication: atrial fibrillation  Allergies  Allergen Reactions  . Sulfonamide Derivatives Other (See Comments)    Childhood reaction    Patient Measurements: Height: 5\' 3"  (160 cm) Weight: 177 lb 14.6 oz (80.7 kg) IBW/kg (Calculated) : 52.4 Heparin Dosing Weight: n/a  Vital Signs: Temp: 98.3 F (36.8 C) (03/06 1009) Temp src: Oral (03/06 1009) BP: 124/69 mmHg (03/06 1009) Pulse Rate: 75 (03/06 0632)  Labs:  Recent Labs  06/12/13 0411 06/13/13 1005 06/14/13 0416  HGB 11.2*  --   --   HCT 35.8*  --   --   PLT 268  --   --   LABPROT  --  15.5* 21.8*  INR  --  1.26 1.97*    Estimated Creatinine Clearance: 32.4 ml/min (by C-G formula based on Cr of 1.37).   Assessment: [age] YOF admitted for diverticulitis restarted on coumadin which was held till 3/3 in case patient needed surgery to drain her abscess. INR today has trended up significantly from 1.26>>1.97 after 2 doses of coumadin. Will lower dose. All other labs as noted above. Home coumadin dose was 2 mg daily. Pt now complaining of increased LLQ abdominal pain. Repeat CT has been ordered.   Goal of Therapy:  INR 2-3 Monitor platelets by anticoagulation protocol: Yes   Plan:  Coumadin 2 mg x 1 dose tonight  Monitor daily INR and s/s of bleeding   Vinnie LevelBenjamin Mister Krahenbuhl, PharmD.  Clinical Pharmacist Pager [phone]

## 2013-06-14 NOTE — Progress Notes (Signed)
06/14/13 0700-1900. Pt.is A/Ox4 and has oxygen at 2 L [place] and continuous IV. Pt.had c/o abdominal pain earlier and prn pain medication was given. Pt.had orders for CT of abdomen and oral contrast was given. Pt.family had concerns about pt.'s cbg's and a possible itchy skin rash to lower back. MD was paged and notified. Medications were placed on hold to give later due to patient awaiting CT of abdomen and pelvis. Reported to oncoming nurse about medications.

## 2013-06-14 NOTE — Progress Notes (Signed)
PT Cancellation Note  Patient Details Name: [name] MRN: [id number] DOB: 09/02/1930   Cancelled Treatment:    Reason Eval/Treat Not Completed: Patient declined, no reason specified. Pt asleep and dgtr requested defer until later as pt has been in pain and just got comfortable and to sleep. Will attempt later as time allows.   [name], Maribelle Hopple Beth 06/14/2013, 11:16 AM Delaney MeigsMaija Tabor Sneijder Bernards, PT [phone]

## 2013-06-14 NOTE — Progress Notes (Signed)
INITIAL NUTRITION ASSESSMENT  DOCUMENTATION CODES Per approved criteria  -Non-severe (moderate) malnutrition in the context of acute illness or injury  Pt meets criteria for Moderate MALNUTRITION in the context of Acute Illness as evidenced by estimated energy intake <75% of estimated energy needs for > 7 days and 6% weight loss in less than one month.  INTERVENTION: Provide Glucerna Shake BID in between meals Provide Pro-stat once daily Provide Multivitamin with minerals daily   NUTRITION DIAGNOSIS: Inadequate oral intake related to restricted diet as evidenced by 6% weight loss in one month.   Goal: Pt to meet >/= 90% of their estimated nutrition needs   Monitor:  PO intake, diet advancement, weight trends, labs, plan of care  Reason for Assessment: Malnutrition Screening Tool, score of 3  80 y.o. female  Admitting Dx: Colonic diverticular abscess  ASSESSMENT: 80 y.o. female with Past medical history of diabetes mellitus, obesity, COPD, chronic diastolic dysfunction, chronic kidney disease, obstructive sleep apnea, on home oxygen, hypothyroidism. The patient presented with lethargy and altered mental status  Pt reports poor appetite and poor po intake- she states she used to be a [employer] and being on a liquid diet is very unappealing. Per pt she usually weighs 208 lbs. Weight history shows that pt has lost 11 lbs in the past month- 6% weight loss. Per family in room, pt may need to have surgery. Encouraged PO intake and use of nutritional supplements to keep nutrition status up and prevent further weight loss. Per nursing notes pt has been eating 0-85% of meals on full liquid diet.    Height: Ht Readings from Last 1 Encounters:  06/07/13 5\' 3"  (1.6 m)    Weight: Wt Readings from Last 1 Encounters:  06/14/13 177 lb 14.6 oz (80.7 kg)    Ideal Body Weight: 115 lbs  % Ideal Body Weight: 154%  Wt Readings from Last 10 Encounters:  06/14/13 177 lb 14.6 oz (80.7 kg)   05/20/13 188 lb (85.276 kg)  03/24/13 196 lb 9.6 oz (89.177 kg)  12/31/12 197 lb (89.359 kg)  12/05/12 203 lb 3.2 oz (92.171 kg)  10/09/12 207 lb (93.895 kg)  10/01/12 207 lb (93.895 kg)  08/30/12 205 lb (92.987 kg)  08/13/12 204 lb (92.534 kg)  07/04/12 209 lb (94.802 kg)    Usual Body Weight: 208 lbs  % Usual Body Weight: 85%  BMI:  Body mass index is 31.52 kg/(m^2).  Estimated Nutritional Needs: Kcal: 1600-1800 Protein: 80-95 grams Fluid: 2 L/day  Skin: +1 RLE and LLE edema  Diet Order: Full Liquid  EDUCATION NEEDS: -No education needs identified at this time   Intake/Output Summary (Last 24 hours) at 06/14/13 1447 Last data filed at 06/14/13 1347  Gross per 24 hour  Intake   1020 ml  Output    540 ml  Net    480 ml    Last BM: 3/5  Labs:   Recent Labs Lab 06/08/13 2341 06/09/13 0457 06/11/13 0327 06/14/13 1152  NA  --  145 139 139  K  --  5.0 4.0 5.5*  CL  --  106 101 99  CO2  --  23 25 25   BUN  --  37* 30* 41*  CREATININE  --  1.34* 1.37* 1.73*  CALCIUM  --  8.6 8.9 9.0  MG 2.0  --   --   --   GLUCOSE  --  82 102* 161*    CBG (last 3)   Recent Labs  06/14/13  0631 06/14/13 0722 06/14/13 1102  GLUCAP 68* 109* 149*    Scheduled Meds: . albuterol  2.5 mg Nebulization BID  . bisoprolol  5 mg Oral Daily  . budesonide (PULMICORT) nebulizer solution  0.25 mg Nebulization BID  . guaiFENesin  600 mg Oral BID  . levothyroxine  50 mcg Oral QAC breakfast  . piperacillin-tazobactam (ZOSYN)  IV  3.375 g Intravenous 3 times per day  . predniSONE  10 mg Oral Q breakfast  . sodium polystyrene  15 g Oral Once  . tiotropium  18 mcg Inhalation Daily  . torsemide  20 mg Oral Daily  . warfarin  2 mg Oral ONCE-1800  . Warfarin - Pharmacist Dosing Inpatient   Does not apply q1800    Continuous Infusions: . dextrose 5 % and 0.45% NaCl 10 mL/hr (06/10/13 1743)    Past Medical History  Diagnosis Date  . Diabetes mellitus   . Obesity   . COPD  (chronic obstructive pulmonary disease)     Moderate. PFTs (12/10): FVC 67%, FEV1 58%, ratio 60%, TLC 95%, RV 138%, DLCO 43% (moderate obstructive defect). She is on home oxygen that should be worn at all times.  . Diastolic CHF, chronic     Echo (3/12): EF 55-60%, mild LV hypertrophy, grade I diastolic dysfunction, mild left atrial enlargement, normal pulmonary artery pressure  . CKD (chronic kidney disease)     Cr 2.4 when last checked. 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 PNA (pneumonia)     h/o with ARDS in 2005; MRSA PNA with prolonged hospitalization in [facility] 11/11  . Diverticulum     Jejunal  . OSA (obstructive sleep apnea)     possible. When pt was sedated for TEE in May 2010, she did develop some upper airway obstruction  . Anemia     of chronic disease  . Hypothyroidism   . Gout   . Atrial fibrillation 07/2008    Failed cardioversion with TEE guidance in May 2010. Pt went back to NSR by 11/2008 with amiodarne  . Hypertension   . GERD (gastroesophageal reflux disease)   . Peripheral vascular disease   . Pneumonia   . Chronic kidney disease   . Respiratory failure, chronic 07/01/2011  . Chronic gout due to renal impairment 07/01/2011    Past Surgical History  Procedure Laterality Date  . Tracheostomy      ARDS/ ICU admission, trach 2005  . Microperforation  08/2008    Of jejunem, ICU admission    Ian Malkin RD, LDN Inpatient Clinical Dietitian Pager: [phone] After Hours Pager: [phone]

## 2013-06-15 DIAGNOSIS — R5381 Other malaise: Secondary | ICD-10-CM

## 2013-06-15 DIAGNOSIS — E46 Unspecified protein-calorie malnutrition: Secondary | ICD-10-CM | POA: Diagnosis present

## 2013-06-15 DIAGNOSIS — E440 Moderate protein-calorie malnutrition: Secondary | ICD-10-CM | POA: Insufficient documentation

## 2013-06-15 DIAGNOSIS — R509 Fever, unspecified: Secondary | ICD-10-CM

## 2013-06-15 DIAGNOSIS — R5383 Other fatigue: Secondary | ICD-10-CM

## 2013-06-15 LAB — BASIC METABOLIC PANEL
BUN: 37 mg/dL — ABNORMAL HIGH (ref 6–23)
CALCIUM: 8.4 mg/dL (ref 8.4–10.5)
CO2: 20 mEq/L (ref 19–32)
Chloride: 97 mEq/L (ref 96–112)
Creatinine, Ser: 1.67 mg/dL — ABNORMAL HIGH (ref 0.50–1.10)
GFR calc Af Amer: 32 mL/min — ABNORMAL LOW (ref >90)
GFR, EST NON AFRICAN AMERICAN: 28 mL/min — AB (ref >90)
Glucose, Bld: 91 mg/dL (ref 70–99)
POTASSIUM: 4.1 meq/L (ref 3.7–5.3)
SODIUM: 134 meq/L — AB (ref 137–147)

## 2013-06-15 LAB — CBC
HCT: 31.7 % — ABNORMAL LOW (ref 36.0–46.0)
Hemoglobin: 10.2 g/dL — ABNORMAL LOW (ref 12.0–15.0)
MCH: 28.8 pg (ref 26.0–34.0)
MCHC: 32.2 g/dL (ref 30.0–36.0)
MCV: 89.5 fL (ref 78.0–100.0)
Platelets: 250 10*3/uL (ref 150–400)
RBC: 3.54 MIL/uL — AB (ref 3.87–5.11)
RDW: 15.2 % (ref 11.5–15.5)
WBC: 8.3 10*3/uL (ref 4.0–10.5)

## 2013-06-15 LAB — PROTIME-INR
INR: 2.86 — ABNORMAL HIGH (ref 0.00–1.49)
Prothrombin Time: 29 seconds — ABNORMAL HIGH (ref 11.6–15.2)

## 2013-06-15 NOTE — Progress Notes (Addendum)
ANTICOAGULATION CONSULT NOTE - Follow Up Consult  Pharmacy Consult for Coumadin Indication: atrial fibrillation  Allergies  Allergen Reactions  . Sulfonamide Derivatives Other (See Comments)    Childhood reaction    Patient Measurements: Height: 5\' 3"  (160 cm) Weight: 182 lb 12.2 oz (82.9 kg) (bedscale) IBW/kg (Calculated) : 52.4  Vital Signs: Temp: 98 F (36.7 C) (03/07 0516) Temp src: Oral (03/07 0516) BP: 92/47 mmHg (03/07 0516) Pulse Rate: 84 (03/07 0516)  Labs:  Recent Labs  06/13/13 1005 06/14/13 0416 06/14/13 1152 06/15/13 0450  HGB  --   --  11.2* 10.2*  HCT  --   --  36.1 31.7*  PLT  --   --  298 250  LABPROT 15.5* 21.8*  --  29.0*  INR 1.26 1.97*  --  2.86*  CREATININE  --   --  1.73* 1.67*    Estimated Creatinine Clearance: 26.9 ml/min (by C-G formula based on Cr of 1.67).   Assessment: 80 yo F admitted for diverticulitis restarted on Coumadin which was held till 3/3 in the event patient needed surgery to drain her abscess. INR today continues to trend up significantly from 1.26>>1.97>>2.86 after 2 daily doses of 4mg  and last night's reduction to 2mg  of Coumadin. No bleeding issues reported.  PTA Coumadin regimen is 2 mg daily.   Goal of Therapy:  INR 2-3 Monitor platelets by anticoagulation protocol: Yes   Plan:  - hold Coumadin dose tonight - daily INR - monitor for s/s of bleeding   Harrold DonathNathan E. Achilles Dunkope, PharmD Clinical Pharmacist - Resident Pager: [phone] Pharmacy: [phone] 06/15/2013 8:55 AM

## 2013-06-15 NOTE — Progress Notes (Signed)
[facility] PROGRESS NOTE Assessment/Plan: Toxic encephalopathy due to infectious etiology: - Due to diverticular abscess and influenza. Now resolved, no events overnight. - Tolerated liq diet with out pain or nausea. - PRN antiemetics and pain meds  - Pt refused SNF. D/c flu precaution.  Diverticular abscess:  - Status post drain drain replacement on 3.4.2015. Now with feculent material. - Continue current IV antibiotics (zosyn 3.6.2015) - Poor candidate for surgery, hopping can be avoided. Will follow general surgery rec's  - Ct abd and pelvis: original fluid collection adjacent to the sigmoid colon is not measurable with drainage catheter in place.  HCAP/influenza:  - Completed HCAP and Influenza treatment therapy. D/c influenza contact. - Continue oxygen supplementation, pulmicort, flutter valve and PRN nebulizers  - Breathing overall stable; and no further SOB   Atrial fibrillation: rate controlled.  - will continue heparin per pharmacy; cont. coumadin.  COPD with chronic resp failure:  - Stable and actively no wheezing  - Will continue home inhalers  - Chronic prednisone and oxygen supplementation.  CKD: stable and at baseline. Will monitor  - Cr 1.7 and by GFR essentially at baseline   Chronic diastolic heart failure: compensated at this moment.  - Last EF 60-65%  - will continue demadex  - strict I's and O's, daily weight      Code Status: Full  Family Communication: no family at bedside  Disposition Plan: to be determine    Consultants:  Surgery  Procedures: See below for x-ray reports Abscess drain placement by IR (2/27) and replacement on (3/3)   Antibiotics: vanc and cefepime, vanc stop on 3.2.2015.  HPI/Subjective: Pain controlled. Now improved. -  Objective: Filed Vitals:   06/14/13 2015 06/14/13 2046 06/15/13 0516 06/15/13 0843  BP:  108/83 92/47   Pulse:  96 84   Temp:  97.2 F (36.2 C) 98 F (36.7 C)   TempSrc:  Axillary  Oral   Resp:  22 18   Height:      Weight:   82.9 kg (182 lb 12.2 oz)   SpO2: 98% 94% 98% 94%    Intake/Output Summary (Last 24 hours) at 06/15/13 0951 Last data filed at 06/15/13 0700  Gross per 24 hour  Intake 4246.75 ml  Output    331 ml  Net 3915.75 ml   Filed Weights   06/13/13 0541 06/14/13 0632 06/15/13 0516  Weight: 80.3 kg (177 lb 0.5 oz) 80.7 kg (177 lb 14.6 oz) 82.9 kg (182 lb 12.2 oz)    Exam:  General: Alert, awake, oriented x3, in no acute distress.  HEENT: No bruits, no goiter.  Heart: Regular rate and rhythm, without murmurs, rubs, gallops.  Lungs: Good air movement, clear    Data Reviewed: Basic Metabolic Panel:  Recent Labs Lab 06/08/13 2341 06/09/13 0457 06/11/13 0327 06/14/13 1152 06/15/13 0450  NA  --  145 139 139 134*  K  --  5.0 4.0 5.5* 4.1  CL  --  106 101 99 97  CO2  --  23 25 25 20   GLUCOSE  --  82 102* 161* 91  BUN  --  37* 30* 41* 37*  CREATININE  --  1.34* 1.37* 1.73* 1.67*  CALCIUM  --  8.6 8.9 9.0 8.4  MG 2.0  --   --   --   --    Liver Function Tests: No results found for this basename: AST, ALT, ALKPHOS, BILITOT, PROT, ALBUMIN,  in the last 168 hours No results found  for this basename: LIPASE, AMYLASE,  in the last 168 hours No results found for this basename: AMMONIA,  in the last 168 hours CBC:  Recent Labs Lab 06/09/13 0457 06/12/13 0411 06/14/13 1152 06/15/13 0450  WBC 6.3 11.9* 10.5 8.3  HGB 10.8* 11.2* 11.2* 10.2*  HCT 34.3* 35.8* 36.1 31.7*  MCV 91.5 89.7 90.0 89.5  PLT 197 268 298 250   Cardiac Enzymes: No results found for this basename: CKTOTAL, CKMB, CKMBINDEX, TROPONINI,  in the last 168 hours BNP (last 3 results)  Recent Labs  07/25/12 0822 12/05/12 1558 06/06/13 2244  PROBNP 399.0* 277.0* 1764.0*   CBG:  Recent Labs Lab 06/13/13 1638 06/13/13 2136 06/14/13 0631 06/14/13 0722 06/14/13 1102  GLUCAP 132* 109* 68* 109* 149*    Recent Results (from the past 240 hour(s))  URINE CULTURE      Status: None   Collection Time    06/06/13  8:25 PM      Result Value Ref Range Status   Specimen Description URINE, CATHETERIZED   Final   Special Requests ADDED [phone]   Final   Culture  Setup Time     Final   Value: 06/07/2013 01:19     Performed at [facility]   Colony Count     Final   Value: NO GROWTH     Performed at [facility]   Culture     Final   Value: NO GROWTH     Performed at [facility]   Report Status 06/07/2013 FINAL   Final  CULTURE, BLOOD (ROUTINE X 2)     Status: None   Collection Time    06/06/13 11:20 PM      Result Value Ref Range Status   Specimen Description BLOOD RIGHT WRIST   Final   Special Requests BOTTLES DRAWN AEROBIC AND ANAEROBIC 3CC   Final   Culture  Setup Time     Final   Value: 06/07/2013 03:27     Performed at [facility]   Culture     Final   Value: NO GROWTH 5 DAYS     Performed at [facility]   Report Status 06/13/2013 FINAL   Final  CULTURE, BLOOD (ROUTINE X 2)     Status: None   Collection Time    06/06/13 11:30 PM      Result Value Ref Range Status   Specimen Description BLOOD RIGHT HAND   Final   Special Requests BOTTLES DRAWN AEROBIC AND ANAEROBIC 5CC   Final   Culture  Setup Time     Final   Value: 06/07/2013 03:27     Performed at [facility]   Culture     Final   Value: NO GROWTH 5 DAYS     Performed at [facility]   Report Status 06/13/2013 FINAL   Final  MRSA PCR SCREENING     Status: None   Collection Time    06/07/13  3:23 AM      Result Value Ref Range Status   MRSA by PCR NEGATIVE  NEGATIVE Final   Comment:            The GeneXpert MRSA Assay (FDA     approved for NASAL specimens     only), is one component of a     comprehensive MRSA colonization     surveillance program. It is not     intended to diagnose MRSA  infection nor to guide or     monitor treatment for     MRSA infections.  CULTURE, ROUTINE-ABSCESS     Status: None    Collection Time    06/07/13  6:48 PM      Result Value Ref Range Status   Specimen Description PERITONEAL CAVITY   Final   Special Requests DIVERTICULAR ABSCESS   Final   Gram Stain     Final   Value: MODERATE WBC PRESENT,BOTH PMN AND MONONUCLEAR     NO SQUAMOUS EPITHELIAL CELLS SEEN     RARE GRAM POSITIVE COCCI     RARE GRAM NEGATIVE RODS     RARE GRAM POSITIVE RODS   Culture     Final   Value: MULTIPLE ORGANISMS PRESENT, NONE PREDOMINANT     Note: NO STAPHYLOCOCCUS AUREUS ISOLATED NO GROUP A STREP (S.PYOGENES) ISOLATED     Performed at [facility]   Report Status 06/11/2013 FINAL   Final  CLOSTRIDIUM DIFFICILE BY PCR     Status: None   Collection Time    06/13/13  1:58 PM      Result Value Ref Range Status   C difficile by pcr NEGATIVE  NEGATIVE Final     Studies: Ct Abdomen Pelvis Wo Contrast  06/14/2013   CLINICAL DATA:  Follow-up abscess drain placement.  EXAM: CT ABDOMEN AND PELVIS WITHOUT CONTRAST  TECHNIQUE: Multidetector CT imaging of the abdomen and pelvis was performed following the standard protocol without intravenous contrast.  COMPARISON:  CT IMAGE GUIDED DRAINAGE BY PERCUTANEOUS CATHETER dated 06/07/2013; CT ABD/PELV WO CM dated 06/07/2013  FINDINGS: Left lower quadrant drainage catheter is in place. The previously seen fluid collection adjacent to the sigmoid colon has decreased significantly in size. Now, there is a fluid and gas tracking along the left iliacus and iliopsoas muscles. Given its proximity to the muscles, this is difficult to measure but is new since prior study. This may be tracking along the catheter tract. This also may be related to catheter flushings. The original fluid collection is not measurable currently.  Cardiomegaly.  Minimal atelectasis in the lung bases.  No effusions.  Layering gallstones within the gallbladder. Mildly nodular contours of the liver suggest cirrhosis, unchanged. Spleen, pancreas, adrenals have an unremarkable unenhanced  appearance. Mild scarring and cortical thinning within the kidneys. Small bilateral hypodensities likely reflect renal cysts.  Sigmoid diverticulosis.  Small bowel is decompressed.  IMPRESSION: The original fluid collection adjacent to the sigmoid colon is not measurable with drainage catheter in place. There is fluid and gas tracking along the left iliacus and iliopsoas muscles, possibly along the tube tract or related to tube flushings.   Electronically Signed   By: Charlett Nose M.D.   On: 06/14/2013 19:52    Scheduled Meds: . albuterol  2.5 mg Nebulization BID  . bisoprolol  5 mg Oral Daily  . budesonide (PULMICORT) nebulizer solution  0.25 mg Nebulization BID  . feeding supplement (GLUCERNA SHAKE)  237 mL Oral BID BM  . feeding supplement (PRO-STAT SUGAR FREE 64)  30 mL Oral Q24H  . guaiFENesin  600 mg Oral BID  . levothyroxine  50 mcg Oral QAC breakfast  . multivitamin with minerals  1 tablet Oral Daily  . piperacillin-tazobactam (ZOSYN)  IV  3.375 g Intravenous 3 times per day  . predniSONE  10 mg Oral Q breakfast  . tiotropium  18 mcg Inhalation Daily  . torsemide  20 mg Oral Daily  . Warfarin - Pharmacist  Dosing Inpatient   Does not apply q1800   Continuous Infusions: . dextrose 5 % and 0.45% NaCl 125 mL/hr at 06/15/13 0700     Marinda Elk  [facility] Pager [phone]. If 8PM-8AM, please contact night-coverage at [email or web address], password [facility] 06/15/2013, 9:51 AM  LOS: 9 days

## 2013-06-15 NOTE — Progress Notes (Signed)
[facility] Progress Note    Subjective: Patient having a lot of drainage around the percutaneous drain.  Very tender and sore  Objective: Vital signs in last 24 hours: Temp:  [97.2 F (36.2 C)-98.1 F (36.7 C)] 98 F (36.7 C) (03/07 0516) Pulse Rate:  [84-96] 84 (03/07 0516) Resp:  [18-22] 18 (03/07 0516) BP: (92-112)/(47-83) 92/47 mmHg (03/07 0516) SpO2:  [94 %-99 %] 94 % (03/07 0843) Weight:  [82.9 kg (182 lb 12.2 oz)] 82.9 kg (182 lb 12.2 oz) (03/07 0516) Last BM Date: 06/15/13  Intake/Output from previous day: 03/06 0701 - 03/07 0700 In: 4426.8 [P.O.:660; I.V.:3466.8; IV Piggyback:300] Out: 331 [Urine:300; Drains:30; Stool:1] Intake/Output this shift:    General: Moderate acute distress  Lungs: Clear  Abd: Erythematous, tender, indurated around percutaneous drain on her left flank.  CT from yesterday does not show any soft tissue collection.    Extremities: No changes  Neuro: Intact  Lab Results:  @LABLAST2 (wbc:2,hgb:2,hct:2,plt:2) BMET  Recent Labs  06/14/13 1152 06/15/13 0450  NA 139 134*  K 5.5* 4.1  CL 99 97  CO2 25 20  GLUCOSE 161* 91  BUN 41* 37*  CREATININE 1.73* 1.67*  CALCIUM 9.0 8.4   PT/INR  Recent Labs  06/14/13 0416 06/15/13 0450  LABPROT 21.8* 29.0*  INR 1.97* 2.86*   ABG No results found for this basename: PHART, PCO2, PO2, HCO3,  in the last 72 hours  Studies/Results: Ct Abdomen Pelvis Wo Contrast  06/14/2013   CLINICAL DATA:  Follow-up abscess drain placement.  EXAM: CT ABDOMEN AND PELVIS WITHOUT CONTRAST  TECHNIQUE: Multidetector CT imaging of the abdomen and pelvis was performed following the standard protocol without intravenous contrast.  COMPARISON:  CT IMAGE GUIDED DRAINAGE BY PERCUTANEOUS CATHETER dated 06/07/2013; CT ABD/PELV WO CM dated 06/07/2013  FINDINGS: Left lower quadrant drainage catheter is in place. The previously seen fluid collection adjacent to the sigmoid colon has decreased significantly in size. Now, there is a  fluid and gas tracking along the left iliacus and iliopsoas muscles. Given its proximity to the muscles, this is difficult to measure but is new since prior study. This may be tracking along the catheter tract. This also may be related to catheter flushings. The original fluid collection is not measurable currently.  Cardiomegaly.  Minimal atelectasis in the lung bases.  No effusions.  Layering gallstones within the gallbladder. Mildly nodular contours of the liver suggest cirrhosis, unchanged. Spleen, pancreas, adrenals have an unremarkable unenhanced appearance. Mild scarring and cortical thinning within the kidneys. Small bilateral hypodensities likely reflect renal cysts.  Sigmoid diverticulosis.  Small bowel is decompressed.  IMPRESSION: The original fluid collection adjacent to the sigmoid colon is not measurable with drainage catheter in place. There is fluid and gas tracking along the left iliacus and iliopsoas muscles, possibly along the tube tract or related to tube flushings.   Electronically Signed   By: Charlett Nose M.D.   On: 06/14/2013 19:52    Anti-infectives: Anti-infectives   Start     Dose/Rate Route Frequency Ordered Stop   06/14/13 1000  vancomycin (VANCOCIN) IVPB 750 mg/150 ml premix  Status:  Discontinued     750 mg 150 mL/hr over 60 Minutes Intravenous  Once 06/14/13 0847 06/14/13 0848   06/13/13 1300  metroNIDAZOLE (FLAGYL) IVPB 500 mg  Status:  Discontinued     500 mg 100 mL/hr over 60 Minutes Intravenous Every 6 hours 06/13/13 1219 06/13/13 1238   06/13/13 1245  piperacillin-tazo (ZOSYN) NICU IV syringe 200 mg/mL  Status:  Discontinued     75 mg/kg  80.3 kg 60.2 mL/hr over 30 Minutes Intravenous Every 8 hours 06/13/13 1237 06/13/13 1243   06/13/13 1245  piperacillin-tazobactam (ZOSYN) IVPB 3.375 g     3.375 g 12.5 mL/hr over 240 Minutes Intravenous 3 times per day 06/13/13 1244     06/07/13 1800  cefTRIAXone (ROCEPHIN) 1 g in dextrose 5 % 50 mL IVPB  Status:   Discontinued     1 g 100 mL/hr over 30 Minutes Intravenous Every 24 hours 06/06/13 2134 06/07/13 0341   06/07/13 1600  azithromycin (ZITHROMAX) tablet 500 mg  Status:  Discontinued     500 mg Oral Every 24 hours 06/06/13 2134 06/07/13 0341   06/07/13 1145  oseltamivir (TAMIFLU) capsule 30 mg     30 mg Oral Daily 06/07/13 1133 06/11/13 1107   06/07/13 0600  ceFEPIme (MAXIPIME) 1 g in dextrose 5 % 50 mL IVPB  Status:  Discontinued     1 g 100 mL/hr over 30 Minutes Intravenous 3 times per day 06/07/13 0341 06/07/13 0353   06/07/13 0500  ceFEPIme (MAXIPIME) 1 g in dextrose 5 % 50 mL IVPB  Status:  Discontinued     1 g 100 mL/hr over 30 Minutes Intravenous Every 24 hours 06/07/13 0354 06/13/13 1237   06/07/13 0400  vancomycin (VANCOCIN) 1,250 mg in sodium chloride 0.9 % 250 mL IVPB  Status:  Discontinued     1,250 mg 166.7 mL/hr over 90 Minutes Intravenous Every 24 hours 06/07/13 0353 06/10/13 0508   06/07/13 0215  metroNIDAZOLE (FLAGYL) IVPB 500 mg     500 mg 100 mL/hr over 60 Minutes Intravenous  Once 06/07/13 0205 06/07/13 0302   06/06/13 2130  cefTRIAXone (ROCEPHIN) 1 g in dextrose 5 % 50 mL IVPB     1 g 100 mL/hr over 30 Minutes Intravenous  Once 06/06/13 2128 06/07/13 0004   06/06/13 2130  azithromycin (ZITHROMAX) 500 mg in dextrose 5 % 250 mL IVPB     500 mg 250 mL/hr over 60 Minutes Intravenous  Once 06/06/13 2128 06/07/13 0122      Assessment/Plan: s/p  INR up to 2.86. I am not sure if the patient will make it out of the hospital without getting a colectomy with a colostomy. Cannot operated on her with elevated INR. Must consider stopping coumadin and putting the patient on IV heparin.  LOS: 9 days   Marta LamasJames O. Gae BonWyatt, III, MD, FACS [phone]--pager [phone]--office [facility] 06/15/2013

## 2013-06-16 DIAGNOSIS — K632 Fistula of intestine: Secondary | ICD-10-CM

## 2013-06-16 DIAGNOSIS — E440 Moderate protein-calorie malnutrition: Secondary | ICD-10-CM

## 2013-06-16 LAB — BASIC METABOLIC PANEL
BUN: 35 mg/dL — ABNORMAL HIGH (ref 6–23)
CALCIUM: 8.8 mg/dL (ref 8.4–10.5)
CO2: 23 mEq/L (ref 19–32)
Chloride: 98 mEq/L (ref 96–112)
Creatinine, Ser: 1.53 mg/dL — ABNORMAL HIGH (ref 0.50–1.10)
GFR calc non Af Amer: 31 mL/min — ABNORMAL LOW (ref >90)
GFR, EST AFRICAN AMERICAN: 36 mL/min — AB (ref >90)
Glucose, Bld: 51 mg/dL — ABNORMAL LOW (ref 70–99)
Potassium: 5 mEq/L (ref 3.7–5.3)
SODIUM: 137 meq/L (ref 137–147)

## 2013-06-16 LAB — PROTIME-INR
INR: 2.82 — AB (ref 0.00–1.49)
Prothrombin Time: 28.7 seconds — ABNORMAL HIGH (ref 11.6–15.2)

## 2013-06-16 NOTE — Progress Notes (Signed)
ANTIBIOTIC CONSULT NOTE - FOLLOW UP  Pharmacy Consult for Zosyn Indication: diverticulitis  Allergies  Allergen Reactions  . Sulfonamide Derivatives Other (See Comments)    Childhood reaction    Patient Measurements: Height: 5\' 3"  (160 cm) Weight: 183 lb 10.3 oz (83.3 kg) IBW/kg (Calculated) : 52.4  Vital Signs: Temp: 97.9 F (36.6 C) (03/08 16100632) Temp src: Oral (03/08 96040632) BP: 118/45 mmHg (03/08 54090632) Pulse Rate: 80 (03/08 0632) Intake/Output from previous day: 03/07 0701 - 03/08 0700 In: 520 [P.O.:420; IV Piggyback:100] Out: 400 [Urine:350; Drains:50] Intake/Output from this shift:    Labs:  Recent Labs  06/14/13 1152 06/15/13 0450 06/16/13 0439  WBC 10.5 8.3  --   HGB 11.2* 10.2*  --   PLT 298 250  --   CREATININE 1.73* 1.67* 1.53*   Estimated Creatinine Clearance: 29.5 ml/min (by C-G formula based on Cr of 1.53). No results found for this basename: VANCOTROUGH, Leodis BinetVANCOPEAK, VANCORANDOM, GENTTROUGH, GENTPEAK, GENTRANDOM, TOBRATROUGH, TOBRAPEAK, TOBRARND, AMIKACINPEAK, AMIKACINTROU, AMIKACIN,  in the last 72 hours   Microbiology: Recent Results (from the past 720 hour(s))  URINE CULTURE     Status: None   Collection Time    06/06/13  8:25 PM      Result Value Ref Range Status   Specimen Description URINE, CATHETERIZED   Final   Special Requests ADDED [id number]   Final   Culture  Setup Time     Final   Value: 06/07/2013 01:19     [facility]   Colony Count     Final   Value: NO GROWTH     [facility]formed at [facility]   Culture     Final   Value: NO GROWTH     Performed at [facility]   Report Status 06/07/2013 FINAL   Final  CULTURE, BLOOD (ROUTINE X 2)     Status: None   Collection Time    06/06/13 11:20 PM      Result Value Ref Range Status   Specimen Description BLOOD RIGHT WRIST   Final   Special Requests BOTTLES DRAWN AEROBIC AND ANAEROBIC 3CC   Final   Culture  Setup Time     Final   Value: 06/07/2013  03:27     Performed at [facility]   Culture     Final   Value: NO GROWTH 5 DAYS     Performed at [facility]   Report Status 06/13/2013 FINAL   Final  CULTURE, BLOOD (ROUTINE X 2)     Status: None   Collection Time    06/06/13 11:30 PM      Result Value Ref Range Status   Specimen Description BLOOD RIGHT HAND   Final   Special Requests BOTTLES DRAWN AEROBIC AND ANAEROBIC 5CC   Final   Culture  Setup Time     Final   Value: 06/07/2013 03:27     Performed at [facility]   Culture     Final   Value: NO GROWTH 5 DAYS     Performed at [facility]   Report Status 06/13/2013 FINAL   Final  MRSA PCR SCREENING     Status: None   Collection Time    06/07/13  3:23 AM      Result Value Ref Range Status   MRSA by PCR NEGATIVE  NEGATIVE Final   Comment:            The GeneXpert MRSA Assay (  FDA     approved for NASAL specimens     only), is one component of a     comprehensive MRSA colonization     surveillance program. It is not     intended to diagnose MRSA     infection nor to guide or     monitor treatment for     MRSA infections.  CULTURE, ROUTINE-ABSCESS     Status: None   Collection Time    06/07/13  6:48 PM      Result Value Ref Range Status   Specimen Description PERITONEAL CAVITY   Final   Special Requests DIVERTICULAR ABSCESS   Final   Gram Stain     Final   Value: MODERATE WBC PRESENT,BOTH PMN AND MONONUCLEAR     NO SQUAMOUS EPITHELIAL CELLS SEEN     RARE GRAM POSITIVE COCCI     RARE GRAM NEGATIVE RODS     RARE GRAM POSITIVE RODS   Culture     Final   Value: MULTIPLE ORGANISMS PRESENT, NONE PREDOMINANT     Note: NO STAPHYLOCOCCUS AUREUS ISOLATED NO GROUP A STREP (S.PYOGENES) ISOLATED     Performed at [facility]   Report Status 06/11/2013 FINAL   Final  CLOSTRIDIUM DIFFICILE BY PCR     Status: None   Collection Time    06/13/13  1:58 PM      Result Value Ref Range Status   C difficile by pcr NEGATIVE  NEGATIVE Final     Anti-infectives   Start     Dose/Rate Route Frequency Ordered Stop   06/14/13 1000  vancomycin (VANCOCIN) IVPB 750 mg/150 ml premix  Status:  Discontinued     750 mg 150 mL/hr over 60 Minutes Intravenous  Once 06/14/13 0847 06/14/13 0848   06/13/13 1300  metroNIDAZOLE (FLAGYL) IVPB 500 mg  Status:  Discontinued     500 mg 100 mL/hr over 60 Minutes Intravenous Every 6 hours 06/13/13 1219 06/13/13 1238   06/13/13 1245  piperacillin-tazo (ZOSYN) NICU IV syringe 200 mg/mL  Status:  Discontinued     75 mg/kg  80.3 kg 60.2 mL/hr over 30 Minutes Intravenous Every 8 hours 06/13/13 1237 06/13/13 1243   06/13/13 1245  piperacillin-tazobactam (ZOSYN) IVPB 3.375 g     3.375 g 12.5 mL/hr over 240 Minutes Intravenous 3 times per day 06/13/13 1244     06/07/13 1800  cefTRIAXone (ROCEPHIN) 1 g in dextrose 5 % 50 mL IVPB  Status:  Discontinued     1 g 100 mL/hr over 30 Minutes Intravenous Every 24 hours 06/06/13 2134 06/07/13 0341   06/07/13 1600  azithromycin (ZITHROMAX) tablet 500 mg  Status:  Discontinued     500 mg Oral Every 24 hours 06/06/13 2134 06/07/13 0341   06/07/13 1145  oseltamivir (TAMIFLU) capsule 30 mg     30 mg Oral Daily 06/07/13 1133 06/11/13 1107   06/07/13 0600  ceFEPIme (MAXIPIME) 1 g in dextrose 5 % 50 mL IVPB  Status:  Discontinued     1 g 100 mL/hr over 30 Minutes Intravenous 3 times per day 06/07/13 0341 06/07/13 0353   06/07/13 0500  ceFEPIme (MAXIPIME) 1 g in dextrose 5 % 50 mL IVPB  Status:  Discontinued     1 g 100 mL/hr over 30 Minutes Intravenous Every 24 hours 06/07/13 0354 06/13/13 1237   06/07/13 0400  vancomycin (VANCOCIN) 1,250 mg in sodium chloride 0.9 % 250 mL IVPB  Status:  Discontinued  1,250 mg 166.7 mL/hr over 90 Minutes Intravenous Every 24 hours 06/07/13 0353 06/10/13 0508   06/07/13 0215  metroNIDAZOLE (FLAGYL) IVPB 500 mg     500 mg 100 mL/hr over 60 Minutes Intravenous  Once 06/07/13 0205 06/07/13 0302   06/06/13 2130  cefTRIAXone (ROCEPHIN) 1  g in dextrose 5 % 50 mL IVPB     1 g 100 mL/hr over 30 Minutes Intravenous  Once 06/06/13 2128 06/07/13 0004   06/06/13 2130  azithromycin (ZITHROMAX) 500 mg in dextrose 5 % 250 mL IVPB     500 mg 250 mL/hr over 60 Minutes Intravenous  Once 06/06/13 2128 06/07/13 0122      Assessment: 80 yo F originally presented with continued abdominal pain (since previous admission 03/2013) and admitted for diverticulitis.  Patient has received cefepime, vancomycin, and Flagyl during hospital course, but now is only on Zosyn.  Whe is afebrile, and the last WBC on 3/7 was 8.3.  SCr is 1.53 with estimated CrCl ~30.  Vanc 2/27>>3/6 Cefepime 2/27>>3/7 tamiflu 2/27>>3/4 Flagyl 3/5>> 3/5 Zosyn 3/5>>   3/5 c.diff >> negative 2/27 abscess/peritoneal cavity >> multiple organisms 2/26 bld x2 >> no growth, final 2/26 Urine >> no growth, final  Goal of Therapy:  Resolution of infection  Plan:  - continue Zosyn 3.375 gm IV q8h (4h extended infusion)  - monitor kidney fx, WBC, temp curve, and clinical progression - f/u duration of therapy  Harrold Donath E. Achilles Dunk, PharmD Clinical Pharmacist - Resident Pager: [phone] Pharmacy: [phone] 06/16/2013 9:51 AM

## 2013-06-16 NOTE — Progress Notes (Signed)
Subjective: Pt states she feels better. No n/v. Denies abd pain. Some soreness around drain site  Objective: Vital signs in last 24 hours: Temp:  [97.9 F (36.6 C)-98.4 F (36.9 C)] 97.9 F (36.6 C) (03/08 1416) Pulse Rate:  [63-89] 63 (03/08 1416) Resp:  [19-20] 20 (03/08 1416) BP: (105-130)/(43-74) 105/46 mmHg (03/08 1416) SpO2:  [98 %-100 %] 98 % (03/08 1416) Weight:  [183 lb 10.3 oz (83.3 kg)] 183 lb 10.3 oz (83.3 kg) (03/08 96040632) Last BM Date: 06/15/13  Intake/Output from previous day: 03/07 0701 - 03/08 0700 In: 520 [P.O.:420; IV Piggyback:100] Out: 400 [Urine:350; Drains:50] Intake/Output this shift: Total I/O In: 240 [P.O.:240] Out: -   Alert, sitting in chair Morbidly obese abd soft, nt, nd. LLQ/flank drain - stool; fair amount of surrounding erythema/cellulitis. Appears to be draining stool from around drain site  Lab Results:   Recent Labs  06/14/13 1152 06/15/13 0450  WBC 10.5 8.3  HGB 11.2* 10.2*  HCT 36.1 31.7*  PLT 298 250   BMET  Recent Labs  06/15/13 0450 06/16/13 0439  NA 134* 137  K 4.1 5.0  CL 97 98  CO2 20 23  GLUCOSE 91 51*  BUN 37* 35*  CREATININE 1.67* 1.53*  CALCIUM 8.4 8.8   PT/INR  Recent Labs  06/15/13 0450 06/16/13 0439  LABPROT 29.0* 28.7*  INR 2.86* 2.82*   ABG No results found for this basename: PHART, PCO2, PO2, HCO3,  in the last 72 hours  Studies/Results: Ct Abdomen Pelvis Wo Contrast  06/14/2013   CLINICAL DATA:  Follow-up abscess drain placement.  EXAM: CT ABDOMEN AND PELVIS WITHOUT CONTRAST  TECHNIQUE: Multidetector CT imaging of the abdomen and pelvis was performed following the standard protocol without intravenous contrast.  COMPARISON:  CT IMAGE GUIDED DRAINAGE BY PERCUTANEOUS CATHETER dated 06/07/2013; CT ABD/PELV WO CM dated 06/07/2013  FINDINGS: Left lower quadrant drainage catheter is in place. The previously seen fluid collection adjacent to the sigmoid colon has decreased significantly in size. Now,  there is a fluid and gas tracking along the left iliacus and iliopsoas muscles. Given its proximity to the muscles, this is difficult to measure but is new since prior study. This may be tracking along the catheter tract. This also may be related to catheter flushings. The original fluid collection is not measurable currently.  Cardiomegaly.  Minimal atelectasis in the lung bases.  No effusions.  Layering gallstones within the gallbladder. Mildly nodular contours of the liver suggest cirrhosis, unchanged. Spleen, pancreas, adrenals have an unremarkable unenhanced appearance. Mild scarring and cortical thinning within the kidneys. Small bilateral hypodensities likely reflect renal cysts.  Sigmoid diverticulosis.  Small bowel is decompressed.  IMPRESSION: The original fluid collection adjacent to the sigmoid colon is not measurable with drainage catheter in place. There is fluid and gas tracking along the left iliacus and iliopsoas muscles, possibly along the tube tract or related to tube flushings.   Electronically Signed   By: Charlett NoseKevin  Dover M.D.   On: 06/14/2013 19:52    Anti-infectives: Anti-infectives   Start     Dose/Rate Route Frequency Ordered Stop   06/14/13 1000  vancomycin (VANCOCIN) IVPB 750 mg/150 ml premix  Status:  Discontinued     750 mg 150 mL/hr over 60 Minutes Intravenous  Once 06/14/13 0847 06/14/13 0848   06/13/13 1300  metroNIDAZOLE (FLAGYL) IVPB 500 mg  Status:  Discontinued     500 mg 100 mL/hr over 60 Minutes Intravenous Every 6 hours 06/13/13 1219 06/13/13 1238  06/13/13 1245  piperacillin-tazo (ZOSYN) NICU IV syringe 200 mg/mL  Status:  Discontinued     75 mg/kg  80.3 kg 60.2 mL/hr over 30 Minutes Intravenous Every 8 hours 06/13/13 1237 06/13/13 1243   06/13/13 1245  piperacillin-tazobactam (ZOSYN) IVPB 3.375 g     3.375 g 12.5 mL/hr over 240 Minutes Intravenous 3 times per day 06/13/13 1244     06/07/13 1800  cefTRIAXone (ROCEPHIN) 1 g in dextrose 5 % 50 mL IVPB  Status:   Discontinued     1 g 100 mL/hr over 30 Minutes Intravenous Every 24 hours 06/06/13 2134 06/07/13 0341   06/07/13 1600  azithromycin (ZITHROMAX) tablet 500 mg  Status:  Discontinued     500 mg Oral Every 24 hours 06/06/13 2134 06/07/13 0341   06/07/13 1145  oseltamivir (TAMIFLU) capsule 30 mg     30 mg Oral Daily 06/07/13 1133 06/11/13 1107   06/07/13 0600  ceFEPIme (MAXIPIME) 1 g in dextrose 5 % 50 mL IVPB  Status:  Discontinued     1 g 100 mL/hr over 30 Minutes Intravenous 3 times per day 06/07/13 0341 06/07/13 0353   06/07/13 0500  ceFEPIme (MAXIPIME) 1 g in dextrose 5 % 50 mL IVPB  Status:  Discontinued     1 g 100 mL/hr over 30 Minutes Intravenous Every 24 hours 06/07/13 0354 06/13/13 1237   06/07/13 0400  vancomycin (VANCOCIN) 1,250 mg in sodium chloride 0.9 % 250 mL IVPB  Status:  Discontinued     1,250 mg 166.7 mL/hr over 90 Minutes Intravenous Every 24 hours 06/07/13 0353 06/10/13 0508   06/07/13 0215  metroNIDAZOLE (FLAGYL) IVPB 500 mg     500 mg 100 mL/hr over 60 Minutes Intravenous  Once 06/07/13 0205 06/07/13 0302   06/06/13 2130  cefTRIAXone (ROCEPHIN) 1 g in dextrose 5 % 50 mL IVPB     1 g 100 mL/hr over 30 Minutes Intravenous  Once 06/06/13 2128 06/07/13 0004   06/06/13 2130  azithromycin (ZITHROMAX) 500 mg in dextrose 5 % 250 mL IVPB     500 mg 250 mL/hr over 60 Minutes Intravenous  Once 06/06/13 2128 06/07/13 0122      Assessment/Plan: Sigmoid diverticulitis with abscess s/p perc drain 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 For now cont drain, local wound care - frequent drain sponge dressing changes around drain.  Will see how skin does. Probably need to add coverage for skin cellulitis  Mary Sella. Andrey Campanile, MD, FACS General, Bariatric, & Minimally Invasive Surgery [facility], [place]   LOS: 10 days    Atilano Ina 06/16/2013

## 2013-06-16 NOTE — Progress Notes (Signed)
[facility] PROGRESS NOTE Assessment/Plan: Toxic encephalopathy due to infectious etiology: - Due to diverticular abscess and influenza. Now resolved, no events overnight. - Tolerated liq diet with out pain or nausea. - PRN antiemetics and pain meds  - Pt refused SNF. D/c flu precaution.  Diverticular abscess:  - Status post drain drain replacement on 3.4.2015. Now with feculent material. - Continue current IV antibiotics (zosyn 3.6.2015) - Poor candidate for surgery, hopping can be avoided.  - Ct abd and pelvis: original fluid collection adjacent to the sigmoid colon is not measurable with drainage catheter in place. - no change further management per Surgery.  HCAP/influenza:  - Completed HCAP and Influenza treatment therapy. D/c influenza contact. - Continue oxygen supplementation, pulmicort, flutter valve and PRN nebulizers  - Breathing overall stable; and no further SOB   Atrial fibrillation: rate controlled.  - will continue heparin per pharmacy; cont. coumadin.  COPD with chronic resp failure:  - Stable and actively no wheezing  - Will continue home inhalers  - Chronic prednisone and oxygen supplementation.  CKD: stable and at baseline. Will monitor  - Cr 1.7 and by GFR essentially at baseline   Chronic diastolic heart failure: compensated at this moment.  - Last EF 60-65%  - will continue demadex  - strict I's and O's, daily weight      Code Status: Full  Family Communication: no family at bedside  Disposition Plan: to be determine    Consultants:  Surgery  Procedures: See below for x-ray reports Abscess drain placement by IR (2/27) and replacement on (3/3)   Antibiotics: vanc and cefepime, vanc stop on 3.2.2015.  HPI/Subjective: Pain controlled. Now improved. Drainage improved  Objective: Filed Vitals:   06/15/13 2038 06/15/13 2111 06/16/13 0228 06/16/13 0632  BP:  115/43 130/74 118/45  Pulse:  85 89 80  Temp:  98.3 F (36.8 C)  98.4 F (36.9 C) 97.9 F (36.6 C)  TempSrc:  Oral Oral Oral  Resp:  19 20 19   Height:      Weight:    83.3 kg (183 lb 10.3 oz)  SpO2: 98% 99% 100% 100%    Intake/Output Summary (Last 24 hours) at 06/16/13 1231 Last data filed at 06/16/13 5409  Gross per 24 hour  Intake    520 ml  Output    400 ml  Net    120 ml   Filed Weights   06/14/13 0632 06/15/13 0516 06/16/13 8119  Weight: 80.7 kg (177 lb 14.6 oz) 82.9 kg (182 lb 12.2 oz) 83.3 kg (183 lb 10.3 oz)    Exam:  General: Alert, awake, oriented x3, in no acute distress.  HEENT: No bruits, no goiter.  Heart: Regular rate and rhythm, without murmurs, rubs, gallops.  Lungs: Good air movement, clear    Data Reviewed: Basic Metabolic Panel:  Recent Labs Lab 06/11/13 0327 06/14/13 1152 06/15/13 0450 06/16/13 0439  NA 139 139 134* 137  K 4.0 5.5* 4.1 5.0  CL 101 99 97 98  CO2 25 25 20 23   GLUCOSE 102* 161* 91 51*  BUN 30* 41* 37* 35*  CREATININE 1.37* 1.73* 1.67* 1.53*  CALCIUM 8.9 9.0 8.4 8.8   Liver Function Tests: No results found for this basename: AST, ALT, ALKPHOS, BILITOT, PROT, ALBUMIN,  in the last 168 hours No results found for this basename: LIPASE, AMYLASE,  in the last 168 hours No results found for this basename: AMMONIA,  in the last 168 hours CBC:  Recent Labs Lab  06/12/13 0411 06/14/13 1152 06/15/13 0450  WBC 11.9* 10.5 8.3  HGB 11.2* 11.2* 10.2*  HCT 35.8* 36.1 31.7*  MCV 89.7 90.0 89.5  PLT 268 298 250   Cardiac Enzymes: No results found for this basename: CKTOTAL, CKMB, CKMBINDEX, TROPONINI,  in the last 168 hours BNP (last 3 results)  Recent Labs  07/25/12 0822 12/05/12 1558 06/06/13 2244  PROBNP 399.0* 277.0* 1764.0*   CBG:  Recent Labs Lab 06/13/13 1638 06/13/13 2136 06/14/13 0631 06/14/13 0722 06/14/13 1102  GLUCAP 132* 109* 68* 109* 149*    Recent Results (from the past 240 hour(s))  URINE CULTURE     Status: None   Collection Time    06/06/13  8:25 PM       Result Value Ref Range Status   Specimen Description URINE, CATHETERIZED   Final   Special Requests ADDED [phone]   Final   Culture  Setup Time     Final   Value: 06/07/2013 01:19     Performed at [facility]   Colony Count     Final   Value: NO GROWTH     Performed at [facility]   Culture     Final   Value: NO GROWTH     Performed at [facility]   Report Status 06/07/2013 FINAL   Final  CULTURE, BLOOD (ROUTINE X 2)     Status: None   Collection Time    06/06/13 11:20 PM      Result Value Ref Range Status   Specimen Description BLOOD RIGHT WRIST   Final   Special Requests BOTTLES DRAWN AEROBIC AND ANAEROBIC 3CC   Final   Culture  Setup Time     Final   Value: 06/07/2013 03:27     Performed at [facility]   Culture     Final   Value: NO GROWTH 5 DAYS     Performed at [facility]   Report Status 06/13/2013 FINAL   Final  CULTURE, BLOOD (ROUTINE X 2)     Status: None   Collection Time    06/06/13 11:30 PM      Result Value Ref Range Status   Specimen Description BLOOD RIGHT HAND   Final   Special Requests BOTTLES DRAWN AEROBIC AND ANAEROBIC 5CC   Final   Culture  Setup Time     Final   Value: 06/07/2013 03:27     Performed at [facility]   Culture     Final   Value: NO GROWTH 5 DAYS     Performed at [facility]   Report Status 06/13/2013 FINAL   Final  MRSA PCR SCREENING     Status: None   Collection Time    06/07/13  3:23 AM      Result Value Ref Range Status   MRSA by PCR NEGATIVE  NEGATIVE Final   Comment:            The GeneXpert MRSA Assay (FDA     approved for NASAL specimens     only), is one component of a     comprehensive MRSA colonization     surveillance program. It is not     intended to diagnose MRSA     infection nor to guide or     monitor treatment for     MRSA infections.  CULTURE, ROUTINE-ABSCESS     Status: None   Collection Time    06/07/13  6:48 PM      Result Value Ref  Range Status   Specimen Description PERITONEAL CAVITY   Final   Special Requests DIVERTICULAR ABSCESS   Final   Gram Stain     Final   Value: MODERATE WBC PRESENT,BOTH PMN AND MONONUCLEAR     NO SQUAMOUS EPITHELIAL CELLS SEEN     RARE GRAM POSITIVE COCCI     RARE GRAM NEGATIVE RODS     RARE GRAM POSITIVE RODS   Culture     Final   Value: MULTIPLE ORGANISMS PRESENT, NONE PREDOMINANT     Note: NO STAPHYLOCOCCUS AUREUS ISOLATED NO GROUP A STREP (S.PYOGENES) ISOLATED     Performed at [facility]   Report Status 06/11/2013 FINAL   Final  CLOSTRIDIUM DIFFICILE BY PCR     Status: None   Collection Time    06/13/13  1:58 PM      Result Value Ref Range Status   C difficile by pcr NEGATIVE  NEGATIVE Final     Studies: Ct Abdomen Pelvis Wo Contrast  06/14/2013   CLINICAL DATA:  Follow-up abscess drain placement.  EXAM: CT ABDOMEN AND PELVIS WITHOUT CONTRAST  TECHNIQUE: Multidetector CT imaging of the abdomen and pelvis was performed following the standard protocol without intravenous contrast.  COMPARISON:  CT IMAGE GUIDED DRAINAGE BY PERCUTANEOUS CATHETER dated 06/07/2013; CT ABD/PELV WO CM dated 06/07/2013  FINDINGS: Left lower quadrant drainage catheter is in place. The previously seen fluid collection adjacent to the sigmoid colon has decreased significantly in size. Now, there is a fluid and gas tracking along the left iliacus and iliopsoas muscles. Given its proximity to the muscles, this is difficult to measure but is new since prior study. This may be tracking along the catheter tract. This also may be related to catheter flushings. The original fluid collection is not measurable currently.  Cardiomegaly.  Minimal atelectasis in the lung bases.  No effusions.  Layering gallstones within the gallbladder. Mildly nodular contours of the liver suggest cirrhosis, unchanged. Spleen, pancreas, adrenals have an unremarkable unenhanced appearance. Mild scarring and cortical thinning within the  kidneys. Small bilateral hypodensities likely reflect renal cysts.  Sigmoid diverticulosis.  Small bowel is decompressed.  IMPRESSION: The original fluid collection adjacent to the sigmoid colon is not measurable with drainage catheter in place. There is fluid and gas tracking along the left iliacus and iliopsoas muscles, possibly along the tube tract or related to tube flushings.   Electronically Signed   By: Charlett Nose M.D.   On: 06/14/2013 19:52    Scheduled Meds: . albuterol  2.5 mg Nebulization BID  . bisoprolol  5 mg Oral Daily  . budesonide (PULMICORT) nebulizer solution  0.25 mg Nebulization BID  . feeding supplement (GLUCERNA SHAKE)  237 mL Oral BID BM  . feeding supplement (PRO-STAT SUGAR FREE 64)  30 mL Oral Q24H  . guaiFENesin  600 mg Oral BID  . levothyroxine  50 mcg Oral QAC breakfast  . multivitamin with minerals  1 tablet Oral Daily  . piperacillin-tazobactam (ZOSYN)  IV  3.375 g Intravenous 3 times per day  . predniSONE  10 mg Oral Q breakfast  . tiotropium  18 mcg Inhalation Daily   Continuous Infusions: . dextrose 5 % and 0.45% NaCl 125 mL/hr at 06/15/13 0700     Marinda Elk  [facility] Pager [phone]. If 8PM-8AM, please contact night-coverage at [email or web address], password [facility] 06/16/2013, 12:31 PM  LOS: 10 days

## 2013-06-17 LAB — BASIC METABOLIC PANEL
BUN: 34 mg/dL — ABNORMAL HIGH (ref 6–23)
CALCIUM: 8.7 mg/dL (ref 8.4–10.5)
CO2: 24 mEq/L (ref 19–32)
CREATININE: 1.52 mg/dL — AB (ref 0.50–1.10)
Chloride: 106 mEq/L (ref 96–112)
GFR calc Af Amer: 36 mL/min — ABNORMAL LOW (ref >90)
GFR, EST NON AFRICAN AMERICAN: 31 mL/min — AB (ref >90)
Glucose, Bld: 67 mg/dL — ABNORMAL LOW (ref 70–99)
Potassium: 4.9 mEq/L (ref 3.7–5.3)
Sodium: 140 mEq/L (ref 137–147)

## 2013-06-17 LAB — PROTIME-INR
INR: 2.23 — ABNORMAL HIGH (ref 0.00–1.49)
PROTHROMBIN TIME: 24 s — AB (ref 11.6–15.2)

## 2013-06-17 MED ORDER — LORAZEPAM 0.5 MG PO TABS
0.5000 mg | ORAL_TABLET | Freq: Once | ORAL | Status: AC
  Administered 2013-06-17: 0.5 mg via ORAL
  Filled 2013-06-17 (×2): qty 1

## 2013-06-17 MED ORDER — VANCOMYCIN HCL IN DEXTROSE 750-5 MG/150ML-% IV SOLN
750.0000 mg | INTRAVENOUS | Status: DC
  Administered 2013-06-17 – 2013-06-19 (×2): 750 mg via INTRAVENOUS
  Filled 2013-06-17 (×2): qty 150

## 2013-06-17 MED ORDER — PHYTONADIONE 5 MG PO TABS
2.5000 mg | ORAL_TABLET | Freq: Once | ORAL | Status: AC
  Administered 2013-06-17: 2.5 mg via ORAL
  Filled 2013-06-17: qty 1

## 2013-06-17 NOTE — Progress Notes (Signed)
Patient ID: Heidi HanlonCynthia A Burch, female   DOB: 02/27/1931, 80 y.o.   MRN: [id number]    Subjective: Pt feels ok today. Having some lower abdominal pain.  Tolerating her full liquids still.  Objective: Vital signs in last 24 hours: Temp:  [97.5 F (36.4 C)-97.9 F (36.6 C)] 97.5 F (36.4 C) (03/09 0529) Pulse Rate:  [63-74] 74 (03/09 0529) Resp:  [20] 20 (03/09 0529) BP: (105-122)/(46-57) 112/49 mmHg (03/09 0529) SpO2:  [98 %-99 %] 99 % (03/09 0529) Weight:  [183 lb 1.6 oz (83.054 kg)] 183 lb 1.6 oz (83.054 kg) (03/09 0529) Last BM Date: 06/16/13  Intake/Output from previous day: 03/08 0701 - 03/09 0700 In: 480 [P.O.:480] Out: 300 [Urine:300] Intake/Output this shift: Total I/O In: 240 [P.O.:240] Out: 400 [Urine:400]  PE: Abd: soft, cellulitis around her drain that is tender.  Some tenderness in LLQ, +BS, drain just emptied with no output right now, but stool stained  Lab Results:   Recent Labs  06/14/13 1152 06/15/13 0450  WBC 10.5 8.3  HGB 11.2* 10.2*  HCT 36.1 31.7*  PLT 298 250   BMET  Recent Labs  06/16/13 0439 06/17/13 0556  NA 137 140  K 5.0 4.9  CL 98 106  CO2 23 24  GLUCOSE 51* 67*  BUN 35* 34*  CREATININE 1.53* 1.52*  CALCIUM 8.8 8.7   PT/INR  Recent Labs  06/16/13 0439 06/17/13 0556  LABPROT 28.7* 24.0*  INR 2.82* 2.23*   CMP     Component Value Date/Time   NA 140 06/17/2013 0556   K 4.9 06/17/2013 0556   CL 106 06/17/2013 0556   CO2 24 06/17/2013 0556   GLUCOSE 67* 06/17/2013 0556   BUN 34* 06/17/2013 0556   CREATININE 1.52* 06/17/2013 0556   CREATININE 1.90* 03/23/2012 1621   CALCIUM 8.7 06/17/2013 0556   PROT 6.2 06/07/2013 0426   ALBUMIN 2.4* 06/07/2013 0426   AST 32 06/07/2013 0426   ALT 38* 06/07/2013 0426   ALKPHOS 201* 06/07/2013 0426   BILITOT 0.2* 06/07/2013 0426   GFRNONAA 31* 06/17/2013 0556   GFRAA 36* 06/17/2013 0556   Lipase     Component Value Date/Time   LIPASE 20 06/06/2013 2244       Studies/Results: No results  found.  Anti-infectives: Anti-infectives   Start     Dose/Rate Route Frequency Ordered Stop   06/17/13 1000  vancomycin (VANCOCIN) IVPB 750 mg/150 ml premix     750 mg 150 mL/hr over 60 Minutes Intravenous Every 48 hours 06/17/13 0900     06/14/13 1000  vancomycin (VANCOCIN) IVPB 750 mg/150 ml premix  Status:  Discontinued     750 mg 150 mL/hr over 60 Minutes Intravenous  Once 06/14/13 0847 06/14/13 0848   06/13/13 1300  metroNIDAZOLE (FLAGYL) IVPB 500 mg  Status:  Discontinued     500 mg 100 mL/hr over 60 Minutes Intravenous Every 6 hours 06/13/13 1219 06/13/13 1238   06/13/13 1245  piperacillin-tazo (ZOSYN) NICU IV syringe 200 mg/mL  Status:  Discontinued     75 mg/kg  80.3 kg 60.2 mL/hr over 30 Minutes Intravenous Every 8 hours 06/13/13 1237 06/13/13 1243   06/13/13 1245  piperacillin-tazobactam (ZOSYN) IVPB 3.375 g     3.375 g 12.5 mL/hr over 240 Minutes Intravenous 3 times per day 06/13/13 1244     06/07/13 1800  cefTRIAXone (ROCEPHIN) 1 g in dextrose 5 % 50 mL IVPB  Status:  Discontinued     1 g 100 mL/hr over  30 Minutes Intravenous Every 24 hours 06/06/13 2134 06/07/13 0341   06/07/13 1600  azithromycin (ZITHROMAX) tablet 500 mg  Status:  Discontinued     500 mg Oral Every 24 hours 06/06/13 2134 06/07/13 0341   06/07/13 1145  oseltamivir (TAMIFLU) capsule 30 mg     30 mg Oral Daily 06/07/13 1133 06/11/13 1107   06/07/13 0600  ceFEPIme (MAXIPIME) 1 g in dextrose 5 % 50 mL IVPB  Status:  Discontinued     1 g 100 mL/hr over 30 Minutes Intravenous 3 times per day 06/07/13 0341 06/07/13 0353   06/07/13 0500  ceFEPIme (MAXIPIME) 1 g in dextrose 5 % 50 mL IVPB  Status:  Discontinued     1 g 100 mL/hr over 30 Minutes Intravenous Every 24 hours 06/07/13 0354 06/13/13 1237   06/07/13 0400  vancomycin (VANCOCIN) 1,250 mg in sodium chloride 0.9 % 250 mL IVPB  Status:  Discontinued     1,250 mg 166.7 mL/hr over 90 Minutes Intravenous Every 24 hours 06/07/13 0353 06/10/13 0508    06/07/13 0215  metroNIDAZOLE (FLAGYL) IVPB 500 mg     500 mg 100 mL/hr over 60 Minutes Intravenous  Once 06/07/13 0205 06/07/13 0302   06/06/13 2130  cefTRIAXone (ROCEPHIN) 1 g in dextrose 5 % 50 mL IVPB     1 g 100 mL/hr over 30 Minutes Intravenous  Once 06/06/13 2128 06/07/13 0004   06/06/13 2130  azithromycin (ZITHROMAX) 500 mg in dextrose 5 % 250 mL IVPB     500 mg 250 mL/hr over 60 Minutes Intravenous  Once 06/06/13 2128 06/07/13 0122       Assessment/Plan  1. Diverticulitis with colonic fistula  Patient Active Problem List   Diagnosis Date Noted  . Unspecified protein-calorie malnutrition 06/15/2013  . Malnutrition of moderate degree 06/15/2013  . Transaminitis 03/20/2013  . Generalized weakness 03/20/2013  . Chronic kidney disease (CKD), stage IV  03/20/2013  . Colonic diverticular abscess 03/20/2013  . Oxygen dependent 07/01/2011  . Respiratory failure, chronic 07/01/2011  . Chronic gout due to renal impairment 07/01/2011  . Physical deconditioning 05/20/2011  . Bradycardia 04/02/2011  . Venous reflux 02/14/2011  . Osteoarthritis 01/04/2011  . Fatigue 07/20/2010  . HYPERLIPIDEMIA-MIXED 03/22/2010  . HYPOTHYROIDISM 08/03/2009  . OBSTRUCTIVE SLEEP APNEA 10/07/2008  . HYPERTENSION, UNSPECIFIED 10/01/2008  . SLEEP APNEA 09/29/2008  . Peripheral vascular disease in diabetes mellitus 08/20/2008  . OVERWEIGHT/OBESITY 08/20/2008  . Atrial fibrillation 08/20/2008  . DIASTOLIC HEART FAILURE, CHRONIC 08/20/2008  . COPD with emphysema 08/20/2008  . RENAL FAILURE, CHRONIC 08/20/2008   Plan: 1. Patient is high risk for surgery, but she continues to have problems that we doubt will get better with conservative management.  The stool has stopped draining around her drain, but she has a significant cellulitis from this.  Her scan also questioned some air and fluid along the iliopsoas muscles, which I would question if this was stool inside her belly.  Because of these things, we  will likely plan on resection of the infected area of colon with a diverting colostomy.  I have d/w medicine who will reverse her INR.  She will get a cardiology evaluation today for surgical clearance.  She is high risk due to medical problems and may likely require ventilator post op in the ICU.  She is also on steroids and may need a stress dose tomorrow pre-op as well.  We will speak to her daughter here at the hospital when she arrives or by  phone about this at some point today as well. 2. NPO p MN 3. Add vanc for skin cellulitis along with zosyn 4. Check labs in AM     LOS: 11 days    Demaris Bousquet E 06/17/2013, 9:01 AM Pager: [phone]

## 2013-06-17 NOTE — Progress Notes (Signed)
Subjective: Divertic abscess Drain placed 2/27 Replaced after broken hub 3/3 CT 3/6 + fistula- drain must stay in or go to OR Surgery note gives impression go to OR soon  Objective: Vital signs in last 24 hours: Temp:  [97.5 F (36.4 C)-97.9 F (36.6 C)] 97.5 F (36.4 C) (03/09 0529) Pulse Rate:  [63-74] 74 (03/09 0529) Resp:  [20] 20 (03/09 0529) BP: (105-122)/(46-57) 112/49 mmHg (03/09 0529) SpO2:  [98 %-99 %] 99 % (03/09 0529) Weight:  [83.054 kg (183 lb 1.6 oz)] 83.054 kg (183 lb 1.6 oz) (03/09 0529) Last BM Date: 06/16/13  Intake/Output from previous day: 03/08 0701 - 03/09 0700 In: 480 [P.O.:480] Out: 300 [Urine:300] Intake/Output this shift: Total I/O In: 240 [P.O.:240] Out: 400 [Urine:400]  PE:  Afeb; vss Output scant x 2 days: + feculent Site clean and dry Labs wnl   Lab Results:   Recent Labs  06/14/13 1152 06/15/13 0450  WBC 10.5 8.3  HGB 11.2* 10.2*  HCT 36.1 31.7*  PLT 298 250   BMET  Recent Labs  06/16/13 0439 06/17/13 0556  NA 137 140  K 5.0 4.9  CL 98 106  CO2 23 24  GLUCOSE 51* 67*  BUN 35* 34*  CREATININE 1.53* 1.52*  CALCIUM 8.8 8.7   PT/INR  Recent Labs  06/16/13 0439 06/17/13 0556  LABPROT 28.7* 24.0*  INR 2.82* 2.23*   ABG No results found for this basename: PHART, PCO2, PO2, HCO3,  in the last 72 hours  Studies/Results: No results found.  Anti-infectives: Anti-infectives   Start     Dose/Rate Route Frequency Ordered Stop   06/17/13 1000  vancomycin (VANCOCIN) IVPB 750 mg/150 ml premix     750 mg 150 mL/hr over 60 Minutes Intravenous Every 48 hours 06/17/13 0900     06/14/13 1000  vancomycin (VANCOCIN) IVPB 750 mg/150 ml premix  Status:  Discontinued     750 mg 150 mL/hr over 60 Minutes Intravenous  Once 06/14/13 0847 06/14/13 0848   06/13/13 1300  metroNIDAZOLE (FLAGYL) IVPB 500 mg  Status:  Discontinued     500 mg 100 mL/hr over 60 Minutes Intravenous Every 6 hours 06/13/13 1219 06/13/13 1238    06/13/13 1245  piperacillin-tazo (ZOSYN) NICU IV syringe 200 mg/mL  Status:  Discontinued     75 mg/kg  80.3 kg 60.2 mL/hr over 30 Minutes Intravenous Every 8 hours 06/13/13 1237 06/13/13 1243   06/13/13 1245  piperacillin-tazobactam (ZOSYN) IVPB 3.375 g     3.375 g 12.5 mL/hr over 240 Minutes Intravenous 3 times per day 06/13/13 1244     06/07/13 1800  cefTRIAXone (ROCEPHIN) 1 g in dextrose 5 % 50 mL IVPB  Status:  Discontinued     1 g 100 mL/hr over 30 Minutes Intravenous Every 24 hours 06/06/13 2134 06/07/13 0341   06/07/13 1600  azithromycin (ZITHROMAX) tablet 500 mg  Status:  Discontinued     500 mg Oral Every 24 hours 06/06/13 2134 06/07/13 0341   06/07/13 1145  oseltamivir (TAMIFLU) capsule 30 mg     30 mg Oral Daily 06/07/13 1133 06/11/13 1107   06/07/13 0600  ceFEPIme (MAXIPIME) 1 g in dextrose 5 % 50 mL IVPB  Status:  Discontinued     1 g 100 mL/hr over 30 Minutes Intravenous 3 times per day 06/07/13 0341 06/07/13 0353   06/07/13 0500  ceFEPIme (MAXIPIME) 1 g in dextrose 5 % 50 mL IVPB  Status:  Discontinued     1 g 100  mL/hr over 30 Minutes Intravenous Every 24 hours 06/07/13 0354 06/13/13 1237   06/07/13 0400  vancomycin (VANCOCIN) 1,250 mg in sodium chloride 0.9 % 250 mL IVPB  Status:  Discontinued     1,250 mg 166.7 mL/hr over 90 Minutes Intravenous Every 24 hours 06/07/13 0353 06/10/13 0508   06/07/13 0215  metroNIDAZOLE (FLAGYL) IVPB 500 mg     500 mg 100 mL/hr over 60 Minutes Intravenous  Once 06/07/13 0205 06/07/13 0302   06/06/13 2130  cefTRIAXone (ROCEPHIN) 1 g in dextrose 5 % 50 mL IVPB     1 g 100 mL/hr over 30 Minutes Intravenous  Once 06/06/13 2128 06/07/13 0004   06/06/13 2130  azithromycin (ZITHROMAX) 500 mg in dextrose 5 % 250 mL IVPB     500 mg 250 mL/hr over 60 Minutes Intravenous  Once 06/06/13 2128 06/07/13 0122      Assessment/Plan: s/p * No surgery found *  Divertic abscess + fistula per CT Plan prob OR soon per [facility]   LOS: 11 days     [name] A 06/17/2013

## 2013-06-17 NOTE — Progress Notes (Signed)
Pt says she wants something for anxiety, MD notified, order placed for ativan.  Will carry out MD orders and continue to monitor.

## 2013-06-17 NOTE — Consult Note (Signed)
CARDIOLOGY CONSULT NOTE   Patient ID: [name] MRN: [id number] DOB/AGE: 78/09/1930 78 y.o.  Admit Date: 06/06/2013 Primary Physician: Heidi LevansPatrick Wright, MD  Primary Cardiologist    [facility]   Clinical Summary [name] is a [age] y.o.female.  She is followed in cardiology by Dr. Shirlee LatchMcLean. She saw him last in the office May 20, 2013. She is currently admitted with an ongoing abdominal problem that may require surgery. Cardiology is consulted to help with the preop clearance.  The patient has a history of chronic diastolic CHF. Her last ejection fraction was 60% by echo in November, 2013. She also has chronic atrial fibrillation. Historically she has been anticoagulated with Coumadin. Her volume status has been stable during this hospitalization. When she was seen most recently in the office it was noted that her LFTs had been elevated and her statin had been stopped. Consideration was being given to restarting her statin if and when her LFTs did stabilize.   Allergies  Allergen Reactions  . Sulfonamide Derivatives Other (See Comments)    Childhood reaction    Medications Scheduled Medications: . albuterol  2.5 mg Nebulization BID  . bisoprolol  5 mg Oral Daily  . budesonide (PULMICORT) nebulizer solution  0.25 mg Nebulization BID  . feeding supplement (GLUCERNA SHAKE)  237 mL Oral BID BM  . feeding supplement (PRO-STAT SUGAR FREE 64)  30 mL Oral Q24H  . guaiFENesin  600 mg Oral BID  . levothyroxine  50 mcg Oral QAC breakfast  . multivitamin with minerals  1 tablet Oral Daily  . phytonadione  2.5 mg Oral Once  . piperacillin-tazobactam (ZOSYN)  IV  3.375 g Intravenous 3 times per day  . predniSONE  10 mg Oral Q breakfast  . tiotropium  18 mcg Inhalation Daily  . vancomycin  750 mg Intravenous Q48H     Infusions: . dextrose 5 % and 0.45% NaCl 125 mL/hr at 06/15/13 0700     PRN Medications:  albuterol, HYDROcodone-acetaminophen, morphine injection,  traMADol   Past Medical History  Diagnosis Date  . Diabetes mellitus   . Obesity   . COPD (chronic obstructive pulmonary disease)     Moderate. PFTs (12/10): FVC 67%, FEV1 58%, ratio 60%, TLC 95%, RV 138%, DLCO 43% (moderate obstructive defect). She is on home oxygen that should be worn at all times.  . Diastolic CHF, chronic     Echo (3/12): EF 55-60%, mild LV hypertrophy, grade I diastolic dysfunction, mild left atrial enlargement, normal pulmonary artery pressure  . CKD (chronic kidney disease)     Cr 2.4 when last checked. ANCA +, pauci-immune glomerulnephritis followed by Dr. Arrie Aranoladonato  . PNA (pneumonia)     h/o with ARDS in 2005; [facility]  . Diverticulum     Jejunal  . OSA (obstructive sleep apnea)     possible. When pt was sedated for TEE in May 2010, she did develop some upper airway obstruction  . Anemia     of chronic disease  . Hypothyroidism   . Gout   . Atrial fibrillation 07/2008    Failed cardioversion with TEE guidance in May 2010. Pt went back to NSR by 11/2008 with amiodarne  . Hypertension   . GERD (gastroesophageal reflux disease)   . Peripheral vascular disease   . Pneumonia   . Chronic kidney disease   . Respiratory failure, chronic 07/01/2011  . Chronic gout due to renal impairment 07/01/2011  Past Surgical History  Procedure Laterality Date  . Tracheostomy      ARDS/ ICU admission, trach 2005  . Microperforation  08/2008    Of jejunem, ICU admission    Family History  Problem Relation Age of Onset  . Emphysema Mother     and asthma  . Asthma Mother   . Heart disease Neg Hx     premature    Social History [name] reports that she quit smoking about 37 years ago. Her smoking use included Cigarettes. She has a 23 pack-year smoking history. She has never used smokeless tobacco. [name] reports that she drinks alcohol.  Review of Systems   In bed today the patient denies chills,  headache, sweats, change in vision, change in hearing, chest pain, cough, urinary symptoms. All the systems are reviewed and are negative.  Physical Examination Blood pressure 112/49, pulse 74, temperature 97.5 F (36.4 C), temperature source Oral, resp. rate 20, height 5\' 3"  (1.6 m), weight 183 lb 1.6 oz (83.054 kg), SpO2 99.00%.  Intake/Output Summary (Last 24 hours) at 06/17/13 0907 Last data filed at 06/17/13 0900  Gross per 24 hour  Intake    720 ml  Output    700 ml  Net     20 ml    patient is flat in bed. She is wearing oxygen. She is chronically ill. There is no jugulovenous distention. Lungs reveal scattered rhonchi. Cardiac exam reveals S1 and S2. The abdomen is protuberant. I did not examine the abdomen fully. She has chronic skin changes in her lower extremities. There is no significant edema.  Prior Cardiac Testing/Procedures  Lab Results  Basic Metabolic Panel:  Recent Labs Lab 06/11/13 0327 06/14/13 1152 06/15/13 0450 06/16/13 0439 06/17/13 0556  NA 139 139 134* 137 140  K 4.0 5.5* 4.1 5.0 4.9  CL 101 99 97 98 106  CO2 25 25 20 23 24   GLUCOSE 102* 161* 91 51* 67*  BUN 30* 41* 37* 35* 34*  CREATININE 1.37* 1.73* 1.67* 1.53* 1.52*  CALCIUM 8.9 9.0 8.4 8.8 8.7    Liver Function Tests: No results found for this basename: AST, ALT, ALKPHOS, BILITOT, PROT, ALBUMIN,  in the last 168 hours  CBC:  Recent Labs Lab 06/12/13 0411 06/14/13 1152 06/15/13 0450  WBC 11.9* 10.5 8.3  HGB 11.2* 11.2* 10.2*  HCT 35.8* 36.1 31.7*  MCV 89.7 90.0 89.5  PLT 268 298 250    Cardiac Enzymes: No results found for this basename: CKTOTAL, CKMB, CKMBINDEX, TROPONINI,  in the last 168 hours  BNP: No components found with this basename: POCBNP,    Radiology: No results found.   ECG:  I do not see a recorded EKG from this admission. There was an EKG in the office on May 20, 2013. That EKG showed atrial fib with a controlled rate.  Telemetry:    The patient is  not currently on telemetry.   Impression and Recommendations    Colonic diverticular abscess     This is the primary issue that is being treated. It is my understanding that she may need surgery.    Atrial fibrillation      Her rate is controlled. Plan to continue same medications. It is my understanding that the plan early in admission was to hold her Coumadin and use heparin. I'm not sure if this plan was changed and then there was Coumadin for a period of time. The labs now do show that her INR is elevated.  From the cardiac viewpoint it will be acceptable to allow her INR to drift down and proceed with surgery. If it is necessary to use vitamin K, this can be done in the setting. She does not have a mechanical prosthesis. In the long term, return to anticoagulation would be appropriate.    DIASTOLIC HEART FAILURE, CHRONIC     The last echo was November, 2013. There is no definite reason to repeat her echo at this time. Historically the ejection fraction is 60%. Her volume status currently is stable.    COPD with emphysema      She has severe lung disease. This will probably be a larger risk factor for surgery and her cardiac status.    Chronic kidney disease (CKD), stage IV      Her renal function is being followed carefully.   Preop cardiac clearance     The patient's cardiac status is stable. Considering all of her risk factors she certainly is at increased risk in general. From the cardiac viewpoint she should be able to tolerate surgery. As always, careful attention will have to be paid to her volume status. Her atrial fib break can be followed. I will order an EKG so that we have 1 on record during this admission. It will serve as a new baseline before surgery if surgery is undertaken.  Jerral Bonito, MD  06/17/2013, 9:07 AM

## 2013-06-17 NOTE — Progress Notes (Addendum)
ANTIBIOTIC CONSULT NOTE - INITIAL  Pharmacy Consult:  Vancomycin Indication:  Cellulitis  Allergies  Allergen Reactions  . Sulfonamide Derivatives Other (See Comments)    Childhood reaction    Patient Measurements: Height: 5\' 3"  (160 cm) Weight: 183 lb 1.6 oz (83.054 kg) (bed scale) IBW/kg (Calculated) : 52.4  Vital Signs: Temp: 97.5 F (36.4 C) (03/09 0529) Temp src: Oral (03/09 0529) BP: 112/49 mmHg (03/09 0529) Pulse Rate: 74 (03/09 0529) Intake/Output from previous day: 03/08 0701 - 03/09 0700 In: 480 [P.O.:480] Out: 300 [Urine:300] Intake/Output from this shift: Total I/O In: -  Out: 400 [Urine:400]  Labs:  Recent Labs  06/14/13 1152 06/15/13 0450 06/16/13 0439 06/17/13 0556  WBC 10.5 8.3  --   --   HGB 11.2* 10.2*  --   --   PLT 298 250  --   --   CREATININE 1.73* 1.67* 1.53* 1.52*   Estimated Creatinine Clearance: 29.6 ml/min (by C-G formula based on Cr of 1.52). No results found for this basename: VANCOTROUGH, Leodis Binet, VANCORANDOM, GENTTROUGH, GENTPEAK, GENTRANDOM, TOBRATROUGH, TOBRAPEAK, TOBRARND, AMIKACINPEAK, AMIKACINTROU, AMIKACIN,  in the last 72 hours   Microbiology: Recent Results (from the past 720 hour(s))  URINE CULTURE     Status: None   Collection Time    06/06/13  8:25 PM      Result Value Ref Range Status   Specimen Description URINE, CATHETERIZED   Final   Special Requests ADDED [id number]   Final   Culture  Setup Time     Final   Value: 06/07/2013 01:19     Performed at [facility]   Colony Count     Final   Value: NO GROWTH     Performed at [facility]   Culture     Final   Value: NO GROWTH     Performed at [facility]   Report Status 06/07/2013 FINAL   Final  CULTURE, BLOOD (ROUTINE X 2)     Status: None   Collection Time    06/06/13 11:20 PM      Result Value Ref Range Status   Specimen Description BLOOD RIGHT WRIST   Final   Special Requests BOTTLES DRAWN AEROBIC AND ANAEROBIC 3CC   Final    Culture  Setup Time     Final   Value: 06/07/2013 03:27     Performed at [facility]   Culture     Final   Value: NO GROWTH 5 DAYS     Performed at [facility]   Report Status 06/13/2013 FINAL   Final  CULTURE, BLOOD (ROUTINE X 2)     Status: None   Collection Time    06/06/13 11:30 PM      Result Value Ref Range Status   Specimen Description BLOOD RIGHT HAND   Final   Special Requests BOTTLES DRAWN AEROBIC AND ANAEROBIC 5CC   Final   Culture  Setup Time     Final   Value: 06/07/2013 03:27     Performed at [facility]   Culture     Final   Value: NO GROWTH 5 DAYS     Performed at [facility]   Report Status 06/13/2013 FINAL   Final  MRSA PCR SCREENING     Status: None   Collection Time    06/07/13  3:23 AM      Result Value Ref Range Status   MRSA by PCR NEGATIVE  NEGATIVE Final  Comment:            The GeneXpert MRSA Assay (FDA     approved for NASAL specimens     only), is one component of a     comprehensive MRSA colonization     surveillance program. It is not     intended to diagnose MRSA     infection nor to guide or     monitor treatment for     MRSA infections.  CULTURE, ROUTINE-ABSCESS     Status: None   Collection Time    06/07/13  6:48 PM      Result Value Ref Range Status   Specimen Description PERITONEAL CAVITY   Final   Special Requests DIVERTICULAR ABSCESS   Final   Gram Stain     Final   Value: MODERATE WBC PRESENT,BOTH PMN AND MONONUCLEAR     NO SQUAMOUS EPITHELIAL CELLS SEEN     RARE GRAM POSITIVE COCCI     RARE GRAM NEGATIVE RODS     RARE GRAM POSITIVE RODS   Culture     Final   Value: MULTIPLE ORGANISMS PRESENT, NONE PREDOMINANT     Note: NO STAPHYLOCOCCUS AUREUS ISOLATED NO GROUP A STREP (S.PYOGENES) ISOLATED     Performed at [facility]   Report Status 06/11/2013 FINAL   Final  CLOSTRIDIUM DIFFICILE BY PCR     Status: None   Collection Time    06/13/13  1:58 PM      Result Value Ref Range  Status   C difficile by pcr NEGATIVE  NEGATIVE Final    Medical History: Past Medical History  Diagnosis Date  . Diabetes mellitus   . Obesity   . COPD (chronic obstructive pulmonary disease)     Moderate. PFTs (12/10): FVC 67%, FEV1 58%, ratio 60%, TLC 95%, RV 138%, DLCO 43% (moderate obstructive defect). She is on home oxygen that should be worn at all times.  . Diastolic CHF, chronic     Echo (3/12): EF 55-60%, mild LV hypertrophy, grade I diastolic dysfunction, mild left atrial enlargement, normal pulmonary artery pressure  . CKD (chronic kidney disease)     Cr 2.4 when last checked. ANCA +, pauci-immune glomerulnephritis followed by Dr. Arrie Aranoladonato  . PNA (pneumonia)     h/o with ARDS in 2005; [facility]  . Diverticulum     Jejunal  . OSA (obstructive sleep apnea)     possible. When pt was sedated for TEE in May 2010, she did develop some upper airway obstruction  . Anemia     of chronic disease  . Hypothyroidism   . Gout   . Atrial fibrillation 07/2008    Failed cardioversion with TEE guidance in May 2010. Pt went back to NSR by 11/2008 with amiodarne  . Hypertension   . GERD (gastroesophageal reflux disease)   . Peripheral vascular disease   . Pneumonia   . Chronic kidney disease   . Respiratory failure, chronic 07/01/2011  . Chronic gout due to renal impairment 07/01/2011      Assessment: [age] YOF presented to the ED with confusion and disorientation, found to have diverticulitis with associated abscess and HCAP.  Treatment for HCAP completed.  She remains on Zosyn for abdominal coverage, now to resume vancomycin for cellulitis/abscess and to broaden coverage for upcoming surgery.  Previously supra-therapeutic on vancomycin 1250mg  IV Q24H and patient's renal function has since declined.  Vanc 2/27 >> 3/6, resumed 3/9 >>  Cefepime 2/27 >> 3/7 Tamiflu 2/27 >> 3/4  Flagyl 3/5 >> 3/5 Zosyn 3/5 >>   3/5 c.diff >>  negative  2/27 abscess/peritoneal cavity >> multiple organisms  2/26 bld x2 >> no growth, final  2/26 Urine >> no growth, final  3/2 VT = 39 on 1250mg  q24 (SCr 1.37) >> held 3/4 VR = 26.3 >> continue holding vanc 3/5 VR = 21 >> vanc d/c'ed 3/6    Goal of Therapy:  Vanc trough:  10-15 mcg/mL   Plan:  - Vanc 750mg  IV Q48H - Continue Zosyn 3.375gm IV Q8H, 4 hr infusion - Monitor renal fxn, clinical progress, vanc trough as indicated - F/U resume Coumadin post procedure    Nashay Brickley D. Laney Potash, PharmD, BCPS Pager:  [phone] 06/17/2013, 9:22 AM

## 2013-06-17 NOTE — Progress Notes (Signed)
PT Cancellation Note  Patient Details Name: Heidi HanlonCynthia A Burch MRN: [id number] DOB: 01/19/1931   Cancelled Treatment:    Reason Eval/Treat Not Completed: Other (comment) (initiated session then Dr.Blackman entered for surgical consult. Will attempt later as time allows.    [name], Mikea Quadros Beth 06/17/2013, 1:21 PM Delaney MeigsMaija Tabor Allianna Beaubien, PT [phone]

## 2013-06-17 NOTE — Progress Notes (Signed)
Physical Therapy Treatment Patient Details Name: [name] MRN: [id number] DOB: 04-Jan-1931 Today's Date: 06/17/2013 Time: 4098-1191 PT Time Calculation (min): 32 min  PT Assessment / Plan / Recommendation  History of Present Illness [name] is a 80 year old woman with PMH of GERD, dCHF, CKD, hyperlipidemia, COPD, a-fib, PVD, hypothyroidism, diverticulosis, and multiple episodes of C diff who presents with a one-day history of fever, chills, weakness, and vomiting x1. She woke up this morning feeling cold and weak, and vomited once after having a few sips of water. Her daughter checked her temperature and found it was 101.4.    PT Comments   Pt progressing with gait but limited by fatigue and benefits from encouragement and reassurance. EOB noted drain to be oozing stool with no dressing, RN notified who applied dressing prior to further mobility with entire surrounding area red and burning per pt. Pt able to perform pericare in standing on her own after [facility] use. Will continue to follow and recommend daily HEP. Pt plans for potential sx tomorrow.   Follow Up Recommendations  Home health PT;Supervision/Assistance - 24 hour;SNF     Does the patient have the potential to tolerate intense rehabilitation     Barriers to Discharge        Equipment Recommendations       Recommendations for Other Services    Frequency     Progress towards PT Goals Progress towards PT goals: Progressing toward goals (slowly)  Plan Current plan remains appropriate    Precautions / Restrictions Precautions Precautions: Fall Precaution Comments: pt with L flank drain, incontinent of stool   Pertinent Vitals/Pain L flank burning grossly 3/10 VSS on 2L    Mobility  Bed Mobility Overal bed mobility: Needs Assistance Bed Mobility: Supine to Sit Supine to sit: Mod assist General bed mobility comments: mod assist with rail and use of pad to scoot hips to EOB and assist bil LE off bed, min  assist to elevate trunk from surface, max cueing throughout. Initially attempted rolling and side to sit but pt too painful and preferred pivot to EOB with assist of pad Transfers Sit to Stand: Min assist Stand pivot transfers: Min assist General transfer comment: cueing for hand placement and safety with HHA from bed to [facility] then stood again from [facility] Ambulation/Gait Ambulation/Gait assistance: Min assist Ambulation Distance (Feet): 15 Feet Assistive device: Rolling walker (2 wheeled) Gait Pattern/deviations: Step-through pattern;Decreased stride length;Trunk flexed;Shuffle Gait velocity interpretation: <1.8 ft/sec, indicative of risk for recurrent falls General Gait Details: chair pulled behind pt with cues throughout for posture and position in RW. slow shuffle gait    Exercises General Exercises - Lower Extremity Long Arc Quad: AROM;Seated;Both;20 reps Hip Flexion/Marching: AROM;Seated;Both;5 reps   PT Diagnosis:    PT Problem List:   PT Treatment Interventions:     PT Goals (current goals can now be found in the care plan section)    Visit Information  Last PT Received On: 06/17/13 Assistance Needed: +1 Reason Eval/Treat Not Completed: Other (comment) (initiated session then Dr.Blackman entered for surgical consult) History of Present Illness: [name] is a 80 year old woman with PMH of GERD, dCHF, CKD, hyperlipidemia, COPD, a-fib, PVD, hypothyroidism, diverticulosis, and multiple episodes of C diff who presents with a one-day history of fever, chills, weakness, and vomiting x1. She woke up this morning feeling cold and weak, and vomited once after having a few sips of water. Her daughter checked her temperature and found it was 101.4.  Subjective Data      Cognition  Cognition Arousal/Alertness: Awake/alert Behavior During Therapy: WFL for tasks assessed/performed Overall Cognitive Status: Within Functional Limits for tasks assessed    Balance     End of  Session PT - End of Session Equipment Utilized During Treatment: Oxygen Activity Tolerance: Patient tolerated treatment well Patient left: in chair;with call bell/phone within reach;with family/visitor present;with nursing/sitter in room Nurse Communication: Mobility status   GP     Delorse Lekabor, Senna Lape Beth 06/17/2013, 2:57 PM Delaney MeigsMaija Tabor Algenis Ballin, PT [phone]

## 2013-06-17 NOTE — Progress Notes (Signed)
Pt with small amount of brown drainage around drain, patient refuses dressing change at this time. Will continue to monitor. Huel Coventryosenberger, Caeleb Batalla A, RN

## 2013-06-17 NOTE — Progress Notes (Addendum)
[facility] PROGRESS NOTE Assessment/Plan: Toxic encephalopathy due to infectious etiology: - Due to diverticular abscess and influenza. Now resolved, no events overnight. - Tolerated liq diet with out pain or nausea. - PRN antiemetics and pain meds  - Pt refused SNF. D/c flu precaution.  Diverticular abscess:  - Status post drain drain replacement on 3.4.2015. Now with feculent material around drain. - Continue current IV antibiotics (zosyn 3.6.2015). - Ct abd and pelvis: original fluid collection adjacent to the sigmoid colon is not measurable with drainage catheter in place. - Consult cardiology, as the pt is high risk for surgical intervention. - Pre-op assessment: COPD O2L dependant (biggest risk factor) CKD III, a.fib on chronic anticoagulation and chronic diastolic HF. I discuss with  Her and her daughter [name]) that due to her multiple co-morbidities she is high risk for surgery,  she might pass with the surgical intervention,  but they also know that this will not get better without intervention and they will like to proceed with surgery knowing the risk and benefits.  HCAP/influenza:  - Completed HCAP and Influenza treatment therapy. D/c influenza contact. - Continue oxygen supplementation.  Atrial fibrillation: rate controlled.  - D/w pt benefit and risk of reversing her coumadin (that she might have a stroke). - Her CHAD2 score is 3, her CHAD2VASC2 is 5. - They agree with reversing her coumadin.  COPD with chronic resp failure:  - Stable and actively no wheezing  - Will continue home inhalers  - Chronic prednisone and oxygen supplementation.  AKI CKD: stable and at baseline. Will monitor  - Now improving with IV fluids. - cont b-met.  Chronic diastolic heart failure: compensated at this moment.  - Last EF 60-65%  - will continue demadex. - strict I's and O's, daily weight .     Code Status: Full  Family Communication: no family at bedside  Disposition  Plan: to be determine    Consultants:  Surgery  Procedures: See below for x-ray reports Abscess drain placement by IR (2/27) and replacement on (3/3)   Antibiotics: vanc and cefepime, vanc stop on 3.2.2015.  HPI/Subjective: Pain controlled.  Tolerating diet  Objective: Filed Vitals:   06/16/13 1416 06/16/13 1932 06/16/13 2134 06/17/13 0529  BP: 105/46  122/57 112/49  Pulse: 63  71 74  Temp: 97.9 F (36.6 C)  97.9 F (36.6 C) 97.5 F (36.4 C)  TempSrc: Oral  Oral Oral  Resp: '20  20 20  ' Height:      Weight:    83.054 kg (183 lb 1.6 oz)  SpO2: 98% 99% 99% 99%    Intake/Output Summary (Last 24 hours) at 06/17/13 0817 Last data filed at 06/16/13 1841  Gross per 24 hour  Intake    480 ml  Output    300 ml  Net    180 ml   Filed Weights   06/15/13 0516 06/16/13 0632 06/17/13 0529  Weight: 82.9 kg (182 lb 12.2 oz) 83.3 kg (183 lb 10.3 oz) 83.054 kg (183 lb 1.6 oz)    Exam:  General: Alert, awake, oriented x3, in no acute distress.  HEENT: No bruits, no goiter.  Heart: Regular rate and rhythm, without murmurs, rubs, gallops.  Lungs: Good air movement, clear    Data Reviewed: Basic Metabolic Panel:  Recent Labs Lab 06/11/13 0327 06/14/13 1152 06/15/13 0450 06/16/13 0439 06/17/13 0556  NA 139 139 134* 137 140  K 4.0 5.5* 4.1 5.0 4.9  CL 101 99 97 98 106  CO2  '25 25 20 23 24  ' GLUCOSE 102* 161* 91 51* 67*  BUN 30* 41* 37* 35* 34*  CREATININE 1.37* 1.73* 1.67* 1.53* 1.52*  CALCIUM 8.9 9.0 8.4 8.8 8.7   Liver Function Tests: No results found for this basename: AST, ALT, ALKPHOS, BILITOT, PROT, ALBUMIN,  in the last 168 hours No results found for this basename: LIPASE, AMYLASE,  in the last 168 hours No results found for this basename: AMMONIA,  in the last 168 hours CBC:  Recent Labs Lab 06/12/13 0411 06/14/13 1152 06/15/13 0450  WBC 11.9* 10.5 8.3  HGB 11.2* 11.2* 10.2*  HCT 35.8* 36.1 31.7*  MCV 89.7 90.0 89.5  PLT 268 298 250   Cardiac  Enzymes: No results found for this basename: CKTOTAL, CKMB, CKMBINDEX, TROPONINI,  in the last 168 hours BNP (last 3 results)  Recent Labs  07/25/12 0822 12/05/12 1558 06/06/13 2244  PROBNP 399.0* 277.0* 1764.0*   CBG:  Recent Labs Lab 06/13/13 1638 06/13/13 2136 06/14/13 0631 06/14/13 0722 06/14/13 1102  GLUCAP 132* 109* 68* 109* 149*    Recent Results (from the past 240 hour(s))  CULTURE, ROUTINE-ABSCESS     Status: None   Collection Time    06/07/13  6:48 PM      Result Value Ref Range Status   Specimen Description PERITONEAL CAVITY   Final   Special Requests DIVERTICULAR ABSCESS   Final   Gram Stain     Final   Value: MODERATE WBC PRESENT,BOTH PMN AND MONONUCLEAR     NO SQUAMOUS EPITHELIAL CELLS SEEN     RARE GRAM POSITIVE COCCI     RARE GRAM NEGATIVE RODS     RARE GRAM POSITIVE RODS   Culture     Final   Value: MULTIPLE ORGANISMS PRESENT, NONE PREDOMINANT     Note: NO STAPHYLOCOCCUS AUREUS ISOLATED NO GROUP A STREP (S.PYOGENES) ISOLATED     Performed at [facility]   Report Status 06/11/2013 FINAL   Final  CLOSTRIDIUM DIFFICILE BY PCR     Status: None   Collection Time    06/13/13  1:58 PM      Result Value Ref Range Status   C difficile by pcr NEGATIVE  NEGATIVE Final     Studies: No results found.  Scheduled Meds: . albuterol  2.5 mg Nebulization BID  . bisoprolol  5 mg Oral Daily  . budesonide (PULMICORT) nebulizer solution  0.25 mg Nebulization BID  . feeding supplement (GLUCERNA SHAKE)  237 mL Oral BID BM  . feeding supplement (PRO-STAT SUGAR FREE 64)  30 mL Oral Q24H  . guaiFENesin  600 mg Oral BID  . levothyroxine  50 mcg Oral QAC breakfast  . multivitamin with minerals  1 tablet Oral Daily  . piperacillin-tazobactam (ZOSYN)  IV  3.375 g Intravenous 3 times per day  . predniSONE  10 mg Oral Q breakfast  . tiotropium  18 mcg Inhalation Daily   Continuous Infusions: . dextrose 5 % and 0.45% NaCl 125 mL/hr at 06/15/13 0700      Charlynne Cousins  [facility] Pager [phone]. If 8PM-8AM, please contact night-coverage at [email or web address], password [facility] 06/17/2013, 8:17 AM  LOS: 11 days

## 2013-06-17 NOTE — Progress Notes (Signed)
I have seen and examined the patient and agree with the assessment and plans. I had a long discussion with her and her family.  Will get a drain study to confirm if a fistula is present.  I suspect she will need a resection with a colostomy this week.  Tylan Kinn A. Magnus IvanBlackman  MD, FACS

## 2013-06-18 ENCOUNTER — Inpatient Hospital Stay (HOSPITAL_COMMUNITY): Payer: Medicare Other

## 2013-06-18 DIAGNOSIS — G929 Unspecified toxic encephalopathy: Secondary | ICD-10-CM

## 2013-06-18 DIAGNOSIS — R5381 Other malaise: Secondary | ICD-10-CM

## 2013-06-18 DIAGNOSIS — Z01810 Encounter for preprocedural cardiovascular examination: Secondary | ICD-10-CM

## 2013-06-18 DIAGNOSIS — N189 Chronic kidney disease, unspecified: Secondary | ICD-10-CM

## 2013-06-18 DIAGNOSIS — G92 Toxic encephalopathy: Secondary | ICD-10-CM

## 2013-06-18 DIAGNOSIS — I498 Other specified cardiac arrhythmias: Secondary | ICD-10-CM

## 2013-06-18 LAB — CBC
HCT: 34 % — ABNORMAL LOW (ref 36.0–46.0)
HEMOGLOBIN: 10.3 g/dL — AB (ref 12.0–15.0)
MCH: 27.8 pg (ref 26.0–34.0)
MCHC: 30.3 g/dL (ref 30.0–36.0)
MCV: 91.6 fL (ref 78.0–100.0)
PLATELETS: 247 10*3/uL (ref 150–400)
RBC: 3.71 MIL/uL — ABNORMAL LOW (ref 3.87–5.11)
RDW: 15.9 % — ABNORMAL HIGH (ref 11.5–15.5)
WBC: 8.5 10*3/uL (ref 4.0–10.5)

## 2013-06-18 LAB — BASIC METABOLIC PANEL
BUN: 30 mg/dL — ABNORMAL HIGH (ref 6–23)
CALCIUM: 8.5 mg/dL (ref 8.4–10.5)
CO2: 23 mEq/L (ref 19–32)
Chloride: 106 mEq/L (ref 96–112)
Creatinine, Ser: 1.53 mg/dL — ABNORMAL HIGH (ref 0.50–1.10)
GFR calc Af Amer: 36 mL/min — ABNORMAL LOW (ref >90)
GFR, EST NON AFRICAN AMERICAN: 31 mL/min — AB (ref >90)
GLUCOSE: 75 mg/dL (ref 70–99)
Potassium: 4.4 mEq/L (ref 3.7–5.3)
Sodium: 141 mEq/L (ref 137–147)

## 2013-06-18 LAB — SURGICAL PCR SCREEN
MRSA, PCR: NEGATIVE
STAPHYLOCOCCUS AUREUS: NEGATIVE

## 2013-06-18 LAB — PROTIME-INR
INR: 1.77 — ABNORMAL HIGH (ref 0.00–1.49)
INR: 1.62 — ABNORMAL HIGH (ref 0.00–1.49)
PROTHROMBIN TIME: 20.1 s — AB (ref 11.6–15.2)
Prothrombin Time: 18.8 seconds — ABNORMAL HIGH (ref 11.6–15.2)

## 2013-06-18 MED ORDER — PHYTONADIONE 5 MG PO TABS
2.5000 mg | ORAL_TABLET | Freq: Once | ORAL | Status: AC
  Administered 2013-06-18: 2.5 mg via ORAL
  Filled 2013-06-18: qty 1

## 2013-06-18 MED ORDER — BARRIER CREAM NON-SPECIFIED
1.0000 "application " | TOPICAL_CREAM | Freq: Three times a day (TID) | TOPICAL | Status: DC | PRN
  Filled 2013-06-18: qty 1

## 2013-06-18 MED ORDER — WARFARIN SODIUM 4 MG PO TABS
4.0000 mg | ORAL_TABLET | Freq: Once | ORAL | Status: AC
  Administered 2013-06-18: 4 mg via ORAL
  Filled 2013-06-18: qty 1

## 2013-06-18 MED ORDER — WARFARIN - PHARMACIST DOSING INPATIENT
Freq: Every day | Status: DC
  Administered 2013-06-19: 18:00:00

## 2013-06-18 MED ORDER — IOHEXOL 300 MG/ML  SOLN
50.0000 mL | Freq: Once | INTRAMUSCULAR | Status: AC | PRN
  Administered 2013-06-18: 10 mL

## 2013-06-18 MED ORDER — LORAZEPAM 0.5 MG PO TABS
0.5000 mg | ORAL_TABLET | Freq: Once | ORAL | Status: AC
  Administered 2013-06-18: 0.5 mg via ORAL
  Filled 2013-06-18: qty 1

## 2013-06-18 NOTE — Progress Notes (Signed)
I have seen and examined the patient and agree with the assessment and plans.  Will try to continue conservative management with the drain in place  Nixon Kolton A. Magnus IvanBlackman  MD, FACS

## 2013-06-18 NOTE — Discharge Instructions (Signed)

## 2013-06-18 NOTE — Progress Notes (Signed)
INR 1.77 today - okay to give another 2.5 mg Vit K. Close to going to surgery. Will be available if needed.  Chrystie NoseKenneth C. Arbadella Kimbler, MD, [facility] Hospital[facility]ACC Attending Cardiologist [facility]

## 2013-06-18 NOTE — Progress Notes (Addendum)
[facility] PROGRESS NOTE Assessment/Plan: Toxic encephalopathy due to infectious etiology: - Due to diverticular abscess and influenza. Now resolved, no events overnight. - NPO. - PRN antiemetics and pain meds  - Pt refused SNF.   Diverticular abscess:  - Status post drain drain replacement on 3.4.2015. Now with feculent material around drain. - Continue current IV antibiotics (zosyn 3.6.2015). - Ct abd and pelvis: original fluid collection adjacent to the sigmoid colon is not measurable with drainage catheter in place. - Consult cardiology, as the pt is high risk for surgical intervention.- INR 1.7 repate vit K, check INR this afternoon. - Pre-op assessment: COPD O2L dependant (biggest risk factor) CKD III, a.fib on chronic anticoagulation and chronic diastolic HF. I discuss with  Her and her daughter [name]) that due to her multiple co-morbidities she is high risk for surgery,  she might pass with the surgical intervention, she also has a high risk of being on the ventilator longer than average due to her COPD, And they also know that this will not get better without intervention and they will like to proceed with surgery knowing the risk and benefits.  - Surgery reevaluated Ct scan of abdomen and it showed improved fistula and drainage has improved, will allow time to heal. Re-evalaute in 1-2 weeks. - consult CIR  HCAP/influenza:  - Completed HCAP and Influenza treatment therapy. - Continue oxygen supplementation.  Atrial fibrillation: rate controlled.  - D/w pt benefit and risk of reversing her coumadin (that she might have a stroke). - Her CHAD2 score is 3, her CHAD2VASC2 is 5. - They agree with reversing her coumadin. Give additional Vit K, repeat INR in afternoon.  COPD with chronic resp failure:  - Stable and actively no wheezing  - Will continue home inhalers  - Chronic prednisone and oxygen supplementation.  AKI CKD: stable and at baseline. Will monitor  - Now  improving with IV fluids. - cont b-met.  Chronic diastolic heart failure: compensated at this moment.  - Last EF 60-65%  - will continue demadex. - strict I's and O's, daily weight .   Time spent with Patient: 80 minutes  Code Status: Full  Family Communication: no family at bedside  Disposition Plan: to be determine    Consultants:  Surgery  Procedures: See below for x-ray reports Abscess drain placement by IR (2/27) and replacement on (3/3)   Antibiotics: vanc and cefepime, vanc stop on 3.2.2015.  HPI/Subjective: Pain controlled.   Objective: Filed Vitals:   06/17/13 2020 06/17/13 2103 06/18/13 0542 06/18/13 0747  BP: 139/67  125/58   Pulse: 67  86   Temp: 97.9 F (36.6 C)  98 F (36.7 C)   TempSrc: Oral  Oral   Resp: 18  17   Height:      Weight:   83.507 kg (184 lb 1.6 oz)   SpO2: 100% 97% 98% 97%    Intake/Output Summary (Last 24 hours) at 06/18/13 0833 Last data filed at 06/18/13 0700  Gross per 24 hour  Intake    725 ml  Output      0 ml  Net    725 ml   Filed Weights   06/16/13 0632 06/17/13 0529 06/18/13 0542  Weight: 83.3 kg (183 lb 10.3 oz) 83.054 kg (183 lb 1.6 oz) 83.507 kg (184 lb 1.6 oz)    Exam:  General: Alert, awake, oriented x3, in no acute distress.  HEENT: No bruits, no goiter.  Heart: Regular rate and rhythm, without murmurs,  rubs, gallops.  Lungs: Good air movement, clear    Data Reviewed: Basic Metabolic Panel:  Recent Labs Lab 06/14/13 1152 06/15/13 0450 06/16/13 0439 06/17/13 0556 06/18/13 0726  NA 139 134* 137 140 141  K 5.5* 4.1 5.0 4.9 4.4  CL 99 97 98 106 106  CO2 '25 20 23 24 23  ' GLUCOSE 161* 91 51* 67* 75  BUN 41* 37* 35* 34* 30*  CREATININE 1.73* 1.67* 1.53* 1.52* 1.53*  CALCIUM 9.0 8.4 8.8 8.7 8.5   Liver Function Tests: No results found for this basename: AST, ALT, ALKPHOS, BILITOT, PROT, ALBUMIN,  in the last 168 hours No results found for this basename: LIPASE, AMYLASE,  in the last 168 hours No  results found for this basename: AMMONIA,  in the last 168 hours CBC:  Recent Labs Lab 06/12/13 0411 06/14/13 1152 06/15/13 0450 06/18/13 0726  WBC 11.9* 10.5 8.3 8.5  HGB 11.2* 11.2* 10.2* 10.3*  HCT 35.8* 36.1 31.7* 34.0*  MCV 89.7 90.0 89.5 91.6  PLT 268 298 250 247   Cardiac Enzymes: No results found for this basename: CKTOTAL, CKMB, CKMBINDEX, TROPONINI,  in the last 168 hours BNP (last 3 results)  Recent Labs  07/25/12 0822 12/05/12 1558 06/06/13 2244  PROBNP 399.0* 277.0* 1764.0*   CBG:  Recent Labs Lab 06/13/13 1638 06/13/13 2136 06/14/13 0631 06/14/13 0722 06/14/13 1102  GLUCAP 132* 109* 68* 109* 149*    Recent Results (from the past 240 hour(s))  CLOSTRIDIUM DIFFICILE BY PCR     Status: None   Collection Time    06/13/13  1:58 PM      Result Value Ref Range Status   C difficile by pcr NEGATIVE  NEGATIVE Final  SURGICAL PCR SCREEN     Status: None   Collection Time    06/18/13 12:01 AM      Result Value Ref Range Status   MRSA, PCR NEGATIVE  NEGATIVE Final   Staphylococcus aureus NEGATIVE  NEGATIVE Final   Comment:            The Xpert SA Assay (FDA     approved for NASAL specimens     in patients over 91 years of age),     is one component of     a comprehensive surveillance     program.  Test performance has     been validated by [employer] for patients greater     than or equal to 68 year old.     It is not intended     to diagnose infection nor to     guide or monitor treatment.     Studies: No results found.  Scheduled Meds: . albuterol  2.5 mg Nebulization BID  . bisoprolol  5 mg Oral Daily  . budesonide (PULMICORT) nebulizer solution  0.25 mg Nebulization BID  . feeding supplement (GLUCERNA SHAKE)  237 mL Oral BID BM  . feeding supplement (PRO-STAT SUGAR FREE 64)  30 mL Oral Q24H  . guaiFENesin  600 mg Oral BID  . levothyroxine  50 mcg Oral QAC breakfast  . multivitamin with minerals  1 tablet Oral Daily  . phytonadione   2.5 mg Oral Once  . piperacillin-tazobactam (ZOSYN)  IV  3.375 g Intravenous 3 times per day  . predniSONE  10 mg Oral Q breakfast  . tiotropium  18 mcg Inhalation Daily  . vancomycin  750 mg Intravenous Q48H   Continuous Infusions: . dextrose 5 %  and 0.45% NaCl 125 mL/hr at 06/15/13 0700     [name]  [facility] Pager [phone]. If 8PM-8AM, please contact night-coverage at [email or web address], password [facility] 06/18/2013, 8:33 AM  LOS: 12 days

## 2013-06-18 NOTE — Progress Notes (Signed)
ANTICOAGULATION CONSULT NOTE - Follow Up Consult  Pharmacy Consult:  Coumadin Indication:  History of atrial fibrillation  Allergies  Allergen Reactions  . Sulfonamide Derivatives Other (See Comments)    Childhood reaction    Patient Measurements: Height: 5\' 3"  (160 cm) Weight: 184 lb 1.6 oz (83.507 kg) (bed scale) IBW/kg (Calculated) : 52.4  Vital Signs: Temp: 98 F (36.7 C) (03/10 0542) Temp src: Oral (03/10 0542) BP: 135/60 mmHg (03/10 1027) Pulse Rate: 93 (03/10 1027)  Labs:  Recent Labs  06/16/13 0439 06/17/13 0556 06/18/13 0726  HGB  --   --  10.3*  HCT  --   --  34.0*  PLT  --   --  247  LABPROT 28.7* 24.0* 20.1*  INR 2.82* 2.23* 1.77*  CREATININE 1.53* 1.52* 1.53*    Estimated Creatinine Clearance: 29.5 ml/min (by C-G formula based on Cr of 1.53).     Assessment: 83 YOF on Coumadin from PTA for history of Afib.  Coumadin was reversed with Vitamin K x 2 doses for possible surgery.  Procedure canceled and patient to resume Coumadin.  INR was sub-therapeutic this AM at 1.77.  Expect INR to trend down further.   Goal of Therapy:  INR 2-3 Monitor platelets by anticoagulation protocol: Yes Vanc trough 10-15 mcg/mL    Plan:  - Coumadin 4mg  PO today - Daily PT / INR - Vanc 750mg  IV Q48H - Continue Zosyn 3.375gm IV Q8H, 4 hr infusion - Monitor renal fxn, clinical progress, vanc trough as indicated    Bevan Disney D. Laney Potashang, PharmD, BCPS Pager:  [phone] 06/18/2013, 1:09 PM

## 2013-06-18 NOTE — Consult Note (Signed)
Physical Medicine and Rehabilitation Consult Reason for Consult: Toxic encephalopathy/multi-medical Referring Physician: [facility]   HPI: [name] is a 80 y.o. right-handed female with history of diabetes mellitus, atrial fibrillation on chronic Coumadin. obesity, COPD with home oxygen, chronic diastolic dysfunction, chronic kidney disease with baseline creatinine 1.78. Patient independent with walker prior to admission living in a in law suite of her son and daughter-in-law. Presented 06/07/2013 with altered mental status and lethargy. Reports of chronic abdominal pain for the last few months. Cranial CT scan negative for acute changes. CT abdomen and pelvis showed enlarging mildly complex abscesses within the pelvis 4.6 x 3.6 x 2.9 centimeter fluid collections as well as additional enlarging collections of fluid and air along the upper left pelvic side wall measuring 6.3 x 2.5 x 4.4 cm, diffuse diverticulosis, cholelithiasis. General surgery consulted with interventional radiology notified and underwent placement of CT-guided drain into the left pelvic sidewall abscess with 15 mL of purulent fluid aspirated. Maintained on broad-spectrum antibiotics. Chronic Coumadin initially held in case of plan need for surgical intervention. Patient currently on full liquid diet. Bouts of confusion suspect toxic encephalopathy  due to infectious etiology. Drain remains in place with followup per Gen. surgery. Ongoing use of vancomycin and Zosyn for intra-abdominal and skin coverage. Physical therapy evaluation has been completed and ongoing. M.D. has requested physical medicine rehabilitation consult.   Review of Systems  Respiratory: Positive for shortness of breath.   Cardiovascular: Positive for palpitations and leg swelling.  Gastrointestinal: Positive for abdominal pain.       GERD  Musculoskeletal: Positive for joint pain and myalgias.  Neurological: Positive for weakness.  All other systems  reviewed and are negative.   Past Medical History  Diagnosis Date  . Diabetes mellitus   . Obesity   . COPD (chronic obstructive pulmonary disease)     Moderate. PFTs (12/10): FVC 67%, FEV1 58%, ratio 60%, TLC 95%, RV 138%, DLCO 43% (moderate obstructive defect). She is on home oxygen that should be worn at all times.  . Diastolic CHF, chronic     Echo (3/12): EF 55-60%, mild LV hypertrophy, grade I diastolic dysfunction, mild left atrial enlargement, normal pulmonary artery pressure  . CKD (chronic kidney disease)     Cr 2.4 when last checked. 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 PNA (pneumonia)     h/o with ARDS in 2005; MRSA PNA with prolonged hospitalization in [facility] 11/11  . Diverticulum     Jejunal  . OSA (obstructive sleep apnea)     possible. When pt was sedated for TEE in May 2010, she did develop some upper airway obstruction  . Anemia     of chronic disease  . Hypothyroidism   . Gout   . Atrial fibrillation 07/2008    Failed cardioversion with TEE guidance in May 2010. Pt went back to NSR by 11/2008 with amiodarne  . Hypertension   . GERD (gastroesophageal reflux disease)   . Peripheral vascular disease   . Pneumonia   . Chronic kidney disease   . Respiratory failure, chronic 07/01/2011  . Chronic gout due to renal impairment 07/01/2011   Past Surgical History  Procedure Laterality Date  . Tracheostomy      ARDS/ ICU admission, trach 2005  . Microperforation  08/2008    Of jejunem, ICU admission   Family History  Problem Relation Age of Onset  . Emphysema Mother     and asthma  .  Asthma Mother   . Heart disease Neg Hx     premature   Social History:  reports that she quit smoking about 37 years ago. Her smoking use included Cigarettes. She has a 23 pack-year smoking history. She has never used smokeless tobacco. She reports that she drinks alcohol. She reports that she does not use illicit drugs. Allergies:    Allergies  Allergen Reactions  . Sulfonamide Derivatives Other (See Comments)    Childhood reaction   Medications Prior to Admission  Medication Sig Dispense Refill  . albuterol (PROVENTIL) (2.5 MG/3ML) 0.083% nebulizer solution Take 3 mLs (2.5 mg total) by nebulization 2 (two) times daily. Dx: 492.8 COPD with Emphysema  270 mL  5  . bisoprolol (ZEBETA) 10 MG tablet Take 5 mg by mouth daily.      Marland Kitchen guaifenesin (HUMIBID E) 400 MG TABS Take 400 mg by mouth 2 (two) times daily.       Marland Kitchen levothyroxine (SYNTHROID, LEVOTHROID) 50 MCG tablet Take 50 mcg by mouth daily.      . Multiple Vitamin (MULITIVITAMIN WITH MINERALS) TABS Take 1 tablet by mouth daily.      . potassium chloride SA (K-DUR,KLOR-CON) 20 MEQ tablet Take 20 mEq by mouth daily.      . predniSONE (DELTASONE) 10 MG tablet Take 10 mg by mouth daily with breakfast.      . ranitidine (ZANTAC) 150 MG capsule Take 150 mg by mouth every evening.        . tiotropium (SPIRIVA HANDIHALER) 18 MCG inhalation capsule Place 1 capsule (18 mcg total) into inhaler and inhale daily.  30 capsule  11  . torsemide (DEMADEX) 20 MG tablet Take 80 mg by mouth daily.      . traMADol (ULTRAM) 50 MG tablet Take 0.5 tablets (25 mg total) by mouth every 12 (twelve) hours as needed for moderate pain.  10 tablet  0  . warfarin (COUMADIN) 2 MG tablet Take 2 mg by mouth daily at 6 PM.        Home: Home Living Family/patient expects to be discharged to:: Private residence Living Arrangements: Children Available Help at Discharge: Family Type of Home: House Home Access: Stairs to enter Secretary/administrator of Steps: 6 Entrance Stairs-Rails: Can reach both Home Layout: Two level Alternate Level Stairs-Number of Steps: one flight Alternate Level Stairs-Rails: Right Home Equipment: Walker - 2 wheels Additional Comments: son is available to assist with stairs and care  Functional History:   Functional Status:  Mobility:     Ambulation/Gait Ambulation  Distance (Feet): 15 Feet Gait velocity: slow General Gait Details: chair pulled behind pt with cues throughout for posture and position in RW. slow shuffle gait    ADL:    Cognition: Cognition Overall Cognitive Status: Within Functional Limits for tasks assessed Orientation Level: Oriented X4 Cognition Arousal/Alertness: Awake/alert Behavior During Therapy: WFL for tasks assessed/performed Overall Cognitive Status: Within Functional Limits for tasks assessed  Blood pressure 135/60, pulse 93, temperature 98 F (36.7 C), temperature source Oral, resp. rate 17, height 5\' 3"  (1.6 m), weight 83.507 kg (184 lb 1.6 oz), SpO2 97.00%. Physical Exam  Constitutional: She is oriented to person, place, and time.  Morbidly obese  HENT:  Head: Normocephalic.  Eyes: EOM are normal.  Neck: Normal range of motion. Neck supple. No thyromegaly present.  Cardiovascular:  Cardiac rate controlled  Respiratory:  Decreased breath sounds at the bases but clear to auscultation  GI: Soft. Bowel sounds are normal. There is no rebound.  Drain intact with some serosanguineous drainage around tube  Neurological: She is alert and oriented to person, place, and time.  She followed simple commands. CN normal. RUE 4/5. LUE 3- deltoid, 3+ bicep, tricep, HI. RLE 3/5 HF, 3 KE and 4/5 ankle. LLE 1+ HF, 2- KE, 3+ ankle. Decreased distal LT/PP. Fair sitting balance.   Skin:  Numerous bruises over the arms, chronic vascular changes in the distal LE's. Left pelvic incision dressed  Psychiatric: She has a normal mood and affect. Her behavior is normal. Thought content normal.    Results for orders placed during the hospital encounter of 06/06/13 (from the past 24 hour(s))  SURGICAL PCR SCREEN     Status: None   Collection Time    06/18/13 12:01 AM      Result Value Ref Range   MRSA, PCR NEGATIVE  NEGATIVE   Staphylococcus aureus NEGATIVE  NEGATIVE  PROTIME-INR     Status: Abnormal   Collection Time    06/18/13   7:26 AM      Result Value Ref Range   Prothrombin Time 20.1 (*) 11.6 - 15.2 seconds   INR 1.77 (*) 0.00 - 1.49  CBC     Status: Abnormal   Collection Time    06/18/13  7:26 AM      Result Value Ref Range   WBC 8.5  4.0 - 10.5 K/uL   RBC 3.71 (*) 3.87 - 5.11 MIL/uL   Hemoglobin 10.3 (*) 12.0 - 15.0 g/dL   HCT 81.134.0 (*) 91.436.0 - 78.246.0 %   MCV 91.6  78.0 - 100.0 fL   MCH 27.8  26.0 - 34.0 pg   MCHC 30.3  30.0 - 36.0 g/dL   RDW 95.615.9 (*) 21.311.5 - 08.615.5 %   Platelets 247  150 - 400 K/uL  BASIC METABOLIC PANEL     Status: Abnormal   Collection Time    06/18/13  7:26 AM      Result Value Ref Range   Sodium 141  137 - 147 mEq/L   Potassium 4.4  3.7 - 5.3 mEq/L   Chloride 106  96 - 112 mEq/L   CO2 23  19 - 32 mEq/L   Glucose, Bld 75  70 - 99 mg/dL   BUN 30 (*) 6 - 23 mg/dL   Creatinine, Ser 5.781.53 (*) 0.50 - 1.10 mg/dL   Calcium 8.5  8.4 - 46.910.5 mg/dL   GFR calc non Af Amer 31 (*) >90 mL/min   GFR calc Af Amer 36 (*) >90 mL/min   No results found.  Assessment/Plan: Diagnosis: left pelvic abscess with subsequent deconditioning and encephalopathy 1. Does the need for close, 24 hr/day medical supervision in concert with the patient's rehab needs make it unreasonable for this patient to be served in a less intensive setting? Yes 2. Co-Morbidities requiring supervision/potential complications: copd, afib, ckd, gout, DPN 3. Due to bladder management, bowel management, safety, skin/wound care, disease management, medication administration, pain management and patient education, does the patient require 24 hr/day rehab nursing? Yes 4. Does the patient require coordinated care of a physician, rehab nurse, PT (1-2 hrs/day, 5 days/week) and OT (1-2 hrs/day, 5 days/week) to address physical and functional deficits in the context of the above medical diagnosis(es)? Yes Addressing deficits in the following areas: balance, endurance, locomotion, strength, transferring, bowel/bladder control, bathing, dressing,  feeding, grooming, toileting, cognition and psychosocial support 5. Can the patient actively participate in an intensive therapy program of at least 3 hrs of therapy  per day at least 5 days per week? Yes 6. The potential for patient to make measurable gains while on inpatient rehab is excellent 7. Anticipated functional outcomes upon discharge from inpatient rehab are mod I to supervision with PT, mod I to supervision with OT, (observe for need for SLP). 8. Estimated rehab length of stay to reach the above functional goals is: one week 9. Does the patient have adequate social supports to accommodate these discharge functional goals? Yes 10. Anticipated D/C setting: Home 11. Anticipated post D/C treatments: HH therapy 12. Overall Rehab/Functional Prognosis: excellent  RECOMMENDATIONS: This patient's condition is appropriate for continued rehabilitative care in the following setting: CIR Patient has agreed to participate in recommended program. Yes Note that insurance prior authorization may be required for reimbursement for recommended care.  Comment: Rehab Admissions Coordinator to follow up. Has good family supports.  Thanks,  Ranelle Oyster, MD, [place] [facility]     06/18/2013

## 2013-06-18 NOTE — Progress Notes (Signed)
Patient ID: Heidi HanlonCynthia A Burch, female   DOB: 11/03/1930, 80 y.o.   MRN: [id number]    Subjective: Pt feels ok today.  Pain about the same.  Tolerating full liquids with no issues  Objective: Vital signs in last 24 hours: Temp:  [97.6 F (36.4 C)-98 F (36.7 C)] 98 F (36.7 C) (03/10 0542) Pulse Rate:  [67-93] 93 (03/10 1027) Resp:  [17-20] 17 (03/10 0542) BP: (125-141)/(58-67) 135/60 mmHg (03/10 1027) SpO2:  [96 %-100 %] 97 % (03/10 0747) Weight:  [184 lb 1.6 oz (83.507 kg)] 184 lb 1.6 oz (83.507 kg) (03/10 0542) Last BM Date: 06/17/13  Intake/Output from previous day: 03/09 0701 - 03/10 0700 In: 725 [P.O.:720] Out: 400 [Urine:400] Intake/Output this shift: Total I/O In: 5 [Other:5] Out: -   PE: Abd: abdomen is stable.  +BS, redness around the drain still present.  Feculent output around drain present today.  Feculent output in drain still present.  Lab Results:   Recent Labs  06/18/13 0726  WBC 8.5  HGB 10.3*  HCT 34.0*  PLT 247   BMET  Recent Labs  06/17/13 0556 06/18/13 0726  NA 140 141  K 4.9 4.4  CL 106 106  CO2 24 23  GLUCOSE 67* 75  BUN 34* 30*  CREATININE 1.52* 1.53*  CALCIUM 8.7 8.5   PT/INR  Recent Labs  06/17/13 0556 06/18/13 0726  LABPROT 24.0* 20.1*  INR 2.23* 1.77*   CMP     Component Value Date/Time   NA 141 06/18/2013 0726   K 4.4 06/18/2013 0726   CL 106 06/18/2013 0726   CO2 23 06/18/2013 0726   GLUCOSE 75 06/18/2013 0726   BUN 30* 06/18/2013 0726   CREATININE 1.53* 06/18/2013 0726   CREATININE 1.90* 03/23/2012 1621   CALCIUM 8.5 06/18/2013 0726   PROT 6.2 06/07/2013 0426   ALBUMIN 2.4* 06/07/2013 0426   AST 32 06/07/2013 0426   ALT 38* 06/07/2013 0426   ALKPHOS 201* 06/07/2013 0426   BILITOT 0.2* 06/07/2013 0426   GFRNONAA 31* 06/18/2013 0726   GFRAA 36* 06/18/2013 0726   Lipase     Component Value Date/Time   LIPASE 20 06/06/2013 2244       Studies/Results: No results found.  Anti-infectives: Anti-infectives   Start      Dose/Rate Route Frequency Ordered Stop   06/17/13 1000  vancomycin (VANCOCIN) IVPB 750 mg/150 ml premix     750 mg 150 mL/hr over 60 Minutes Intravenous Every 48 hours 06/17/13 0900     06/14/13 1000  vancomycin (VANCOCIN) IVPB 750 mg/150 ml premix  Status:  Discontinued     750 mg 150 mL/hr over 60 Minutes Intravenous  Once 06/14/13 0847 06/14/13 0848   06/13/13 1300  metroNIDAZOLE (FLAGYL) IVPB 500 mg  Status:  Discontinued     500 mg 100 mL/hr over 60 Minutes Intravenous Every 6 hours 06/13/13 1219 06/13/13 1238   06/13/13 1245  piperacillin-tazo (ZOSYN) NICU IV syringe 200 mg/mL  Status:  Discontinued     75 mg/kg  80.3 kg 60.2 mL/hr over 30 Minutes Intravenous Every 8 hours 06/13/13 1237 06/13/13 1243   06/13/13 1245  piperacillin-tazobactam (ZOSYN) IVPB 3.375 g     3.375 g 12.5 mL/hr over 240 Minutes Intravenous 3 times per day 06/13/13 1244     06/07/13 1800  cefTRIAXone (ROCEPHIN) 1 g in dextrose 5 % 50 mL IVPB  Status:  Discontinued     1 g 100 mL/hr over 30 Minutes Intravenous Every 24  hours 06/06/13 2134 06/07/13 0341   06/07/13 1600  azithromycin (ZITHROMAX) tablet 500 mg  Status:  Discontinued     500 mg Oral Every 24 hours 06/06/13 2134 06/07/13 0341   06/07/13 1145  oseltamivir (TAMIFLU) capsule 30 mg     30 mg Oral Daily 06/07/13 1133 06/11/13 1107   06/07/13 0600  ceFEPIme (MAXIPIME) 1 g in dextrose 5 % 50 mL IVPB  Status:  Discontinued     1 g 100 mL/hr over 30 Minutes Intravenous 3 times per day 06/07/13 0341 06/07/13 0353   06/07/13 0500  ceFEPIme (MAXIPIME) 1 g in dextrose 5 % 50 mL IVPB  Status:  Discontinued     1 g 100 mL/hr over 30 Minutes Intravenous Every 24 hours 06/07/13 0354 06/13/13 1237   06/07/13 0400  vancomycin (VANCOCIN) 1,250 mg in sodium chloride 0.9 % 250 mL IVPB  Status:  Discontinued     1,250 mg 166.7 mL/hr over 90 Minutes Intravenous Every 24 hours 06/07/13 0353 06/10/13 0508   06/07/13 0215  metroNIDAZOLE (FLAGYL) IVPB 500 mg     500  mg 100 mL/hr over 60 Minutes Intravenous  Once 06/07/13 0205 06/07/13 0302   06/06/13 2130  cefTRIAXone (ROCEPHIN) 1 g in dextrose 5 % 50 mL IVPB     1 g 100 mL/hr over 30 Minutes Intravenous  Once 06/06/13 2128 06/07/13 0004   06/06/13 2130  azithromycin (ZITHROMAX) 500 mg in dextrose 5 % 250 mL IVPB     500 mg 250 mL/hr over 60 Minutes Intravenous  Once 06/06/13 2128 06/07/13 0122       Assessment/Plan  1. Diverticulitis with abscess and colonic fistula 2. Multiple medical problems  Plan: 1. fistulagram shows a continued fistula, but much smaller than last week.  Given this progress, we feel that continued conservative management to try and get this to heal so we can do a one-stage procedure is acceptable.  This was discussed with the family as well.  They also wish to proceed with continued conservative management.  She continues to have irritation of her abdominal wall from the feculent drainage around the catheter.  We will put barrier cream around this to try and get it to decrease irritation.  Continue vanc and zosyn for intra-abdominal and skin coverage.  Resume full liquids, may be able to give her low fiber diet.  Follow closely and hopefully if this heals we can plan a one stage procedure.   LOS: 12 days    [name] 06/18/2013, 12:29 PM Pager: [phone]

## 2013-06-19 DIAGNOSIS — I4891 Unspecified atrial fibrillation: Secondary | ICD-10-CM

## 2013-06-19 DIAGNOSIS — I5032 Chronic diastolic (congestive) heart failure: Secondary | ICD-10-CM

## 2013-06-19 DIAGNOSIS — K5732 Diverticulitis of large intestine without perforation or abscess without bleeding: Secondary | ICD-10-CM

## 2013-06-19 LAB — PROTIME-INR
INR: 1.53 — AB (ref 0.00–1.49)
Prothrombin Time: 18 seconds — ABNORMAL HIGH (ref 11.6–15.2)

## 2013-06-19 LAB — HEPARIN LEVEL (UNFRACTIONATED): HEPARIN UNFRACTIONATED: 0.2 [IU]/mL — AB (ref 0.30–0.70)

## 2013-06-19 MED ORDER — HEPARIN BOLUS VIA INFUSION
1800.0000 [IU] | Freq: Once | INTRAVENOUS | Status: AC
  Administered 2013-06-19: 1800 [IU] via INTRAVENOUS
  Filled 2013-06-19: qty 1800

## 2013-06-19 MED ORDER — HEPARIN (PORCINE) IN NACL 100-0.45 UNIT/ML-% IJ SOLN
1250.0000 [IU]/h | INTRAMUSCULAR | Status: DC
  Administered 2013-06-19: 850 [IU]/h via INTRAVENOUS
  Administered 2013-06-20 (×2): 1050 [IU]/h via INTRAVENOUS
  Administered 2013-06-20: 1250 [IU]/h via INTRAVENOUS
  Filled 2013-06-19 (×4): qty 250

## 2013-06-19 NOTE — Consult Note (Signed)
Pharmacy Note-  Pharmacy Consult :  80 y.o. female on chronic Coumadin for atrial fibrillation now awaiting decision on whether surgery is to be performed for her fistula.  Coumadin is on hold.  Heparin bridging will be started pending surgical decision.  -  Patient continuing on Vancomycin and Zosyn for open fistula.  Currently without signs of infection.  Latest Labs : Hematology :  Recent Labs  06/17/13 0556 06/18/13 0726 06/18/13 1342 06/19/13 0614  HGB  --  10.3*  --   --   HCT  --  34.0*  --   --   PLT  --  247  --   --   LABPROT 24.0* 20.1* 18.8* 18.0*  INR 2.23* 1.77* 1.62* 1.53*  CREATININE 1.52* 1.53*  --   --     Lab Results  Component Value Date   INR 1.53* 06/19/2013   INR 1.62* 06/18/2013   INR 1.77* 06/18/2013        HGB 10.3* 06/18/2013   HGB 10.2* 06/15/2013   HGB 11.2* 06/14/2013    Current Medication[s] Include: Medication PTA: Prescriptions prior to admission  Medication Sig Dispense Refill  . albuterol (PROVENTIL) (2.5 MG/3ML) 0.083% nebulizer solution Take 3 mLs (2.5 mg total) by nebulization 2 (two) times daily. Dx: 492.8 COPD with Emphysema  270 mL  5  . bisoprolol (ZEBETA) 10 MG tablet Take 5 mg by mouth daily.      Marland Kitchen guaifenesin (HUMIBID E) 400 MG TABS Take 400 mg by mouth 2 (two) times daily.       Marland Kitchen levothyroxine (SYNTHROID, LEVOTHROID) 50 MCG tablet Take 50 mcg by mouth daily.      . Multiple Vitamin (MULITIVITAMIN WITH MINERALS) TABS Take 1 tablet by mouth daily.      . potassium chloride SA (K-DUR,KLOR-CON) 20 MEQ tablet Take 20 mEq by mouth daily.      . predniSONE (DELTASONE) 10 MG tablet Take 10 mg by mouth daily with breakfast.      . ranitidine (ZANTAC) 150 MG capsule Take 150 mg by mouth every evening.        . tiotropium (SPIRIVA HANDIHALER) 18 MCG inhalation capsule Place 1 capsule (18 mcg total) into inhaler and inhale daily.  30 capsule  11  . torsemide (DEMADEX) 20 MG tablet Take 80 mg by mouth daily.      . traMADol (ULTRAM) 50 MG  tablet Take 0.5 tablets (25 mg total) by mouth every 12 (twelve) hours as needed for moderate pain.  10 tablet  0  . warfarin (COUMADIN) 2 MG tablet Take 2 mg by mouth daily at 6 PM.       Scheduled:  Scheduled:  . albuterol  2.5 mg Nebulization BID  . bisoprolol  5 mg Oral Daily  . budesonide (PULMICORT) nebulizer solution  0.25 mg Nebulization BID  . feeding supplement (GLUCERNA SHAKE)  237 mL Oral BID BM  . feeding supplement (PRO-STAT SUGAR FREE 64)  30 mL Oral Q24H  . guaiFENesin  600 mg Oral BID  . levothyroxine  50 mcg Oral QAC breakfast  . multivitamin with minerals  1 tablet Oral Daily  . piperacillin-tazobactam (ZOSYN)  IV  3.375 g Intravenous 3 times per day  . predniSONE  10 mg Oral Q breakfast  . tiotropium  18 mcg Inhalation Daily  . vancomycin  750 mg Intravenous Q48H  . Warfarin - Pharmacist Dosing Inpatient   Does not apply q1800   Infusion[s]: Infusions:  . dextrose 5 % and  0.45% NaCl 125 mL/hr at 06/15/13 0700   Antibiotic[s]: Anti-infectives   Start     Dose/Rate Route Frequency Ordered Stop   06/17/13 1000  vancomycin (VANCOCIN) IVPB 750 mg/150 ml premix     750 mg 150 mL/hr over 60 Minutes Intravenous Every 48 hours 06/17/13 0900     06/14/13 1000  vancomycin (VANCOCIN) IVPB 750 mg/150 ml premix  Status:  Discontinued     750 mg 150 mL/hr over 60 Minutes Intravenous  Once 06/14/13 0847 06/14/13 0848   06/13/13 1300  metroNIDAZOLE (FLAGYL) IVPB 500 mg  Status:  Discontinued     500 mg 100 mL/hr over 60 Minutes Intravenous Every 6 hours 06/13/13 1219 06/13/13 1238   06/13/13 1245  piperacillin-tazo (ZOSYN) NICU IV syringe 200 mg/mL  Status:  Discontinued     75 mg/kg  80.3 kg 60.2 mL/hr over 30 Minutes Intravenous Every 8 hours 06/13/13 1237 06/13/13 1243   06/13/13 1245  piperacillin-tazobactam (ZOSYN) IVPB 3.375 g     3.375 g 12.5 mL/hr over 240 Minutes Intravenous 3 times per day 06/13/13 1244     06/07/13 1800  cefTRIAXone (ROCEPHIN) 1 g in dextrose 5  % 50 mL IVPB  Status:  Discontinued     1 g 100 mL/hr over 30 Minutes Intravenous Every 24 hours 06/06/13 2134 06/07/13 0341   06/07/13 1600  azithromycin (ZITHROMAX) tablet 500 mg  Status:  Discontinued     500 mg Oral Every 24 hours 06/06/13 2134 06/07/13 0341   06/07/13 1145  oseltamivir (TAMIFLU) capsule 30 mg     30 mg Oral Daily 06/07/13 1133 06/11/13 1107   06/07/13 0600  ceFEPIme (MAXIPIME) 1 g in dextrose 5 % 50 mL IVPB  Status:  Discontinued     1 g 100 mL/hr over 30 Minutes Intravenous 3 times per day 06/07/13 0341 06/07/13 0353   06/07/13 0500  ceFEPIme (MAXIPIME) 1 g in dextrose 5 % 50 mL IVPB  Status:  Discontinued     1 g 100 mL/hr over 30 Minutes Intravenous Every 24 hours 06/07/13 0354 06/13/13 1237   06/07/13 0400  vancomycin (VANCOCIN) 1,250 mg in sodium chloride 0.9 % 250 mL IVPB  Status:  Discontinued     1,250 mg 166.7 mL/hr over 90 Minutes Intravenous Every 24 hours 06/07/13 0353 06/10/13 0508   06/07/13 0215  metroNIDAZOLE (FLAGYL) IVPB 500 mg     500 mg 100 mL/hr over 60 Minutes Intravenous  Once 06/07/13 0205 06/07/13 0302   06/06/13 2130  cefTRIAXone (ROCEPHIN) 1 g in dextrose 5 % 50 mL IVPB     1 g 100 mL/hr over 30 Minutes Intravenous  Once 06/06/13 2128 06/07/13 0004   06/06/13 2130  azithromycin (ZITHROMAX) 500 mg in dextrose 5 % 250 mL IVPB     500 mg 250 mL/hr over 60 Minutes Intravenous  Once 06/06/13 2128 06/07/13 0122      Assessment :  Today's INR is subtherapeutic [s/p 2 doses of Vitamin K].   INR is 1.53.    No bleeding complications observed.  Goal :  Heparin bridging while Coumadin on hold.  Heparin goal is Heparin level 0.3-0.7 units/ml.  Plan : 1. Heparin will be started with Heparin 1800 units x 1, then Heparin infusion at 850 units/hr.   The next Heparin Level will be due in 8 hours @ 2000. 2. Hold Coumadin pending surgery decision. 3. Heparin level in 8 hours, 2000 pm. 4. Daily Heparin level, INR, CBC, and Monitor for bleeding  complications.  Nyelah Emmerich, Elisha Headland, Pharm.D. 06/19/2013  10:47 AM

## 2013-06-19 NOTE — Consult Note (Signed)
Pharmacy Note-  Pharmacy Consult :  80 y.o. female on chronic Coumadin for atrial fibrillation now awaiting decision on whether surgery is to be performed for her fistula.  Coumadin is on hold.  Heparin bridging started pending surgical decision.   Latest Labs : Hematology :  Recent Labs  06/17/13 0556 06/18/13 0726 06/18/13 1342 06/19/13 0614 06/19/13 2153  HGB  --  10.3*  --   --   --   HCT  --  34.0*  --   --   --   PLT  --  247  --   --   --   LABPROT 24.0* 20.1* 18.8* 18.0*  --   INR 2.23* 1.77* 1.62* 1.53*  --   HEPARINUNFRC  --   --   --   --  0.20*  CREATININE 1.52* 1.53*  --   --   --     Lab Results  Component Value Date   INR 1.53* 06/19/2013   INR 1.62* 06/18/2013   INR 1.77* 06/18/2013        HGB 10.3* 06/18/2013   HGB 10.2* 06/15/2013   HGB 11.2* 06/14/2013   I Assessment :  Heparin level is subtherapeutic at 0.2 on 850 units/hr  Goal :  Heparin bridging while Coumadin on hold.  Heparin goal is Heparin level 0.3-0.7 units/ml.  Plan : 1. Increase heparin to 1050 units/hr 2.   Heparin level and CBC with AM labs  Sergi Gellner, Garth BignessJeremy John, Pharm.D. 06/19/2013  10:28 PM

## 2013-06-19 NOTE — Progress Notes (Signed)
[facility] PROGRESS NOTE Assessment/Plan: Toxic encephalopathy due to infectious etiology: - Due to diverticular abscess, PNA and influenza. Now resolved, no events overnight. - diet advance to full liquid; no surgery for now. - PRN antiemetics and pain meds  - Pt refused SNF; plan is for CIR for rehab  Diverticular abscess and fistula:  - Status post drain drain replacement on 3.4.2015. Now with feculent material around drain. - Continue current IV antibiotics (zosyn 3.6.2015). - Ct abd and pelvis: original fluid collection adjacent to the sigmoid colon is not measurable with drainage catheter in place. -plan is to continue conservative management for now -will follow surgery rec's  - Pre-op assessment: COPD O2L dependant (biggest risk factor) CKD III, a.fib on chronic anticoagulation and chronic diastolic HF. I discuss with  Her and her daughter [name]) that due to her multiple co-morbidities she is high risk for surgery,  she might pass with the surgical intervention, she also has a high risk of being on the ventilator longer than average due to her COPD, And they are ok with risks and would like to proceed with surgery if needed - Surgery reevaluated Ct scan of abdomen and it showed that fistula and drainage has improved, will allow time to heal. Re-evalaute in 1-2 weeks. - consult CIR  HCAP/influenza:  - Completed HCAP and Influenza treatment therapy. - Continue chronic oxygen supplementation.  Atrial fibrillation: rate controlled.  - Her CHAD2 score is 3, her CHAD2VASC2 is 5. - will start heparin and await final decision from surgery before restarting coumadin  COPD with chronic resp failure:  - Stable and no wheezing  - Will continue home inhalers  - Chronic prednisone and oxygen supplementation.  AKI CKD: stable and at baseline. Will monitor  - Now improving after IV fluids. - follow b-met.  Chronic diastolic heart failure: compensated at this moment.  - Last  EF 60-65%  - will continue demadex. - strict I's and O's, daily weight .   Code Status: Full  Family Communication: no family at bedside  Disposition Plan: CIR when stable   Consultants:  Surgery  Cardiology   Procedures: See below for x-ray reports Abscess drain placement by IR (2/27) and replacement on (3/3)   Antibiotics: vanc  Zosyn   HPI/Subjective: AAOX3, no fever; still with LLQ abd pain; patient denies nausea and vomiting.  Objective: Filed Vitals:   06/19/13 0847 06/19/13 0942 06/19/13 1300 06/19/13 2005  BP:  116/52 125/61   Pulse:  65 88   Temp:  97.7 F (36.5 C) 98.2 F (36.8 C)   TempSrc:  Oral Oral   Resp:  18 18   Height:      Weight:      SpO2: 94% 95% 97% 96%    Intake/Output Summary (Last 24 hours) at 06/19/13 2026 Last data filed at 06/19/13 1700  Gross per 24 hour  Intake    600 ml  Output     50 ml  Net    550 ml   Filed Weights   06/17/13 0529 06/18/13 0542 06/19/13 0651  Weight: 83.054 kg (183 lb 1.6 oz) 83.507 kg (184 lb 1.6 oz) 82.5 kg (181 lb 14.1 oz)    Exam:  General: Alert, awake, oriented x3, in no acute distress.  HEENT: No bruits, no goiter.  Heart: Regular rate and rhythm, without murmurs, rubs, gallops.  Lungs: Good air movement, clear  Abdomen: with Left flank percutaneous drain in place; no guarding; positive BS   Data Reviewed:  Basic Metabolic Panel:  Recent Labs Lab 06/14/13 1152 06/15/13 0450 06/16/13 0439 06/17/13 0556 06/18/13 0726  NA 139 134* 137 140 141  K 5.5* 4.1 5.0 4.9 4.4  CL 99 97 98 106 106  CO2 '25 20 23 24 23  ' GLUCOSE 161* 91 51* 67* 75  BUN 41* 37* 35* 34* 30*  CREATININE 1.73* 1.67* 1.53* 1.52* 1.53*  CALCIUM 9.0 8.4 8.8 8.7 8.5   CBC:  Recent Labs Lab 06/14/13 1152 06/15/13 0450 06/18/13 0726  WBC 10.5 8.3 8.5  HGB 11.2* 10.2* 10.3*  HCT 36.1 31.7* 34.0*  MCV 90.0 89.5 91.6  PLT 298 250 247   BNP (last 3 results)  Recent Labs  07/25/12 0822 12/05/12 1558  06/06/13 2244  PROBNP 399.0* 277.0* 1764.0*   CBG:  Recent Labs Lab 06/13/13 1638 06/13/13 2136 06/14/13 0631 06/14/13 0722 06/14/13 1102  GLUCAP 132* 109* 68* 109* 149*    Recent Results (from the past 240 hour(s))  CLOSTRIDIUM DIFFICILE BY PCR     Status: None   Collection Time    06/13/13  1:58 PM      Result Value Ref Range Status   C difficile by pcr NEGATIVE  NEGATIVE Final  SURGICAL PCR SCREEN     Status: None   Collection Time    06/18/13 12:01 AM      Result Value Ref Range Status   MRSA, PCR NEGATIVE  NEGATIVE Final   Staphylococcus aureus NEGATIVE  NEGATIVE Final   Comment:            The Xpert SA Assay (FDA     approved for NASAL specimens     in patients over 41 years of age),     is one component of     a comprehensive surveillance     program.  Test performance has     been validated by [employer] for patients greater     than or equal to 70 year old.     It is not intended     to diagnose infection nor to     guide or monitor treatment.     Studies: Ir Sinus/fist Tube Chk-non Gi  06/18/2013   CLINICAL DATA History of diverticular abscess and post percutaneous abscess drainage. Evaluate for residual fistula.  EXAM SINUS TRACT INJECTION/FISTULOGRAM  Physician: Stephan Minister. Henn, MD  FLUOROSCOPY TIME 48 seconds  MEDICATIONS None  ANESTHESIA/SEDATION Moderate sedation time: None  PROCEDURE Patient was placed supine on the interventional table. The left pelvic drain was injected with contrast under fluoroscopy. The drain was flushed with normal saline at the end of the procedure.  FINDINGS Stable position of the pigtail catheter in the left hemipelvis. Drain injection demonstrates a small irregular fluid collection around the drain. There appears to be a small amount of contrast draining into the adjacent bowel. Overall, the fistula tract has become smaller and less conspicuous compared to the prior examination.  COMPLICATIONS None  IMPRESSION There continues  to be a fistula tract between the abscess cavity and bowel. However, this fistula tract is smaller and much less conspicuous.  SIGNATURE  Electronically Signed   By: Markus Daft M.D.   On: 06/18/2013 13:16    Scheduled Meds: . albuterol  2.5 mg Nebulization BID  . bisoprolol  5 mg Oral Daily  . budesonide (PULMICORT) nebulizer solution  0.25 mg Nebulization BID  . feeding supplement (GLUCERNA SHAKE)  237 mL Oral BID BM  . feeding  supplement (PRO-STAT SUGAR FREE 64)  30 mL Oral Q24H  . guaiFENesin  600 mg Oral BID  . levothyroxine  50 mcg Oral QAC breakfast  . multivitamin with minerals  1 tablet Oral Daily  . piperacillin-tazobactam (ZOSYN)  IV  3.375 g Intravenous 3 times per day  . predniSONE  10 mg Oral Q breakfast  . tiotropium  18 mcg Inhalation Daily  . vancomycin  750 mg Intravenous Q48H  . Warfarin - Pharmacist Dosing Inpatient   Does not apply q1800   Continuous Infusions: . dextrose 5 % and 0.45% NaCl 125 mL/hr at 06/15/13 0700  . heparin 850 Units/hr (06/19/13 1239)    Time > 30 minutes  Zerita Boers  [facility] Pager [phone]. If 8PM-8AM, please contact night-coverage at [email or web address], password [facility] 06/19/2013, 8:26 PM  LOS: 13 days

## 2013-06-19 NOTE — Progress Notes (Signed)
DAILY PROGRESS NOTE  Subjective:  No urgent plans for surgery at this time. INR is subtherapeutic. She has history of a-fib.   Objective:  Temp:  [97.7 F (36.5 C)-98 F (36.7 C)] 97.7 F (36.5 C) (03/11 0942) Pulse Rate:  [65-93] 65 (03/11 0942) Resp:  [18] 18 (03/11 0942) BP: (116-141)/(52-70) 116/52 mmHg (03/11 0942) SpO2:  [94 %-99 %] 95 % (03/11 0942) Weight:  [181 lb 14.1 oz (82.5 kg)] 181 lb 14.1 oz (82.5 kg) (03/11 0651) Weight change: -2 lb 3.5 oz (-1.007 kg)  Intake/Output from previous day: 03/10 0701 - 03/11 0700 In: 245 [P.O.:240] Out: 50 [Drains:50]  Intake/Output from this shift:    Medications: Current Facility-Administered Medications  Medication Dose Route Frequency Provider Last Rate Last Dose  . albuterol (PROVENTIL) (2.5 MG/3ML) 0.083% nebulizer solution 2.5 mg  2.5 mg Nebulization BID Berle Mull, MD   2.5 mg at 06/19/13 0847  . albuterol (PROVENTIL) (2.5 MG/3ML) 0.083% nebulizer solution 2.5 mg  2.5 mg Nebulization Q4H PRN Dianne Dun, NP   2.5 mg at 06/10/13 0144  . barrier cream (non-specified) 1 application  1 application Topical TID PRN Henreitta Cea, PA-C      . bisoprolol (ZEBETA) tablet 5 mg  5 mg Oral Daily Berle Mull, MD   5 mg at 06/19/13 1020  . budesonide (PULMICORT) nebulizer solution 0.25 mg  0.25 mg Nebulization BID Zerita Boers, MD   0.25 mg at 06/19/13 0847  . dextrose 5 %-0.45 % sodium chloride infusion   Intravenous Continuous Charlynne Cousins, MD 125 mL/hr at 06/15/13 0700    . feeding supplement (GLUCERNA SHAKE) (GLUCERNA SHAKE) liquid 237 mL  237 mL Oral BID BM Baird Lyons, RD   237 mL at 06/19/13 1020  . feeding supplement (PRO-STAT SUGAR FREE 64) liquid 30 mL  30 mL Oral Q24H Baird Lyons, RD   30 mL at 06/18/13 1648  . guaiFENesin (MUCINEX) 12 hr tablet 600 mg  600 mg Oral BID Zerita Boers, MD   600 mg at 06/19/13 6122  . HYDROcodone-acetaminophen (NORCO/VICODIN) 5-325 MG  per tablet 2 tablet  2 tablet Oral Q4H PRN Charlynne Cousins, MD   2 tablet at 06/19/13 [phone]  . levothyroxine (SYNTHROID, LEVOTHROID) tablet 50 mcg  50 mcg Oral QAC breakfast Berle Mull, MD   50 mcg at 06/19/13 [phone]  . morphine 2 MG/ML injection 1-2 mg  1-2 mg Intravenous Q4H PRN Zerita Boers, MD   2 mg at 06/14/13 2108  . multivitamin with minerals tablet 1 tablet  1 tablet Oral Daily Baird Lyons, RD   1 tablet at 06/19/13 0921  . piperacillin-tazobactam (ZOSYN) IVPB 3.375 g  3.375 g Intravenous 3 times per day Charlynne Cousins, MD   3.375 g at 06/19/13 0636  . predniSONE (DELTASONE) tablet 10 mg  10 mg Oral Q breakfast Berle Mull, MD   10 mg at 06/19/13 5110  . tiotropium (SPIRIVA) inhalation capsule 18 mcg  18 mcg Inhalation Daily Berle Mull, MD   18 mcg at 06/19/13 0847  . traMADol (ULTRAM) tablet 25 mg  25 mg Oral Q12H PRN Berle Mull, MD   25 mg at 06/11/13 2225  . vancomycin (VANCOCIN) IVPB 750 mg/150 ml premix  750 mg Intravenous Q48H Thuy Dien Dang, RPH   750 mg at 06/19/13 0919  . Warfarin - Pharmacist Dosing Inpatient   Does not apply [place]  Physical Exam: General appearance: alert and no distress Lungs: clear to auscultation bilaterally Heart: regular rate and rhythm, S1, S2 normal, no murmur, click, rub or gallop Abdomen: soft, mildly tender, drain in place Extremities: edema trace to 1+  Lab Results: Results for orders placed during the hospital encounter of 06/06/13 (from the past 48 hour(s))  SURGICAL PCR SCREEN     Status: None   Collection Time    06/18/13 12:01 AM      Result Value Ref Range   MRSA, PCR NEGATIVE  NEGATIVE   Staphylococcus aureus NEGATIVE  NEGATIVE   Comment:            The Xpert SA Assay (FDA     approved for NASAL specimens     in patients over 29 years of age),     is one component of     a comprehensive surveillance     program.  Test performance has     been validated by [employer]  for patients greater     than or equal to 53 year old.     It is not intended     to diagnose infection nor to     guide or monitor treatment.  PROTIME-INR     Status: Abnormal   Collection Time    06/18/13  7:26 AM      Result Value Ref Range   Prothrombin Time 20.1 (*) 11.6 - 15.2 seconds   INR 1.77 (*) 0.00 - 1.49  CBC     Status: Abnormal   Collection Time    06/18/13  7:26 AM      Result Value Ref Range   WBC 8.5  4.0 - 10.5 K/uL   RBC 3.71 (*) 3.87 - 5.11 MIL/uL   Hemoglobin 10.3 (*) 12.0 - 15.0 g/dL   HCT 34.0 (*) 36.0 - 46.0 %   MCV 91.6  78.0 - 100.0 fL   MCH 27.8  26.0 - 34.0 pg   MCHC 30.3  30.0 - 36.0 g/dL   RDW 15.9 (*) 11.5 - 15.5 %   Platelets 247  150 - 400 K/uL  BASIC METABOLIC PANEL     Status: Abnormal   Collection Time    06/18/13  7:26 AM      Result Value Ref Range   Sodium 141  137 - 147 mEq/L   Potassium 4.4  3.7 - 5.3 mEq/L   Chloride 106  96 - 112 mEq/L   CO2 23  19 - 32 mEq/L   Glucose, Bld 75  70 - 99 mg/dL   BUN 30 (*) 6 - 23 mg/dL   Creatinine, Ser 1.53 (*) 0.50 - 1.10 mg/dL   Calcium 8.5  8.4 - 10.5 mg/dL   GFR calc non Af Amer 31 (*) >90 mL/min   GFR calc Af Amer 36 (*) >90 mL/min   Comment: (NOTE)     The eGFR has been calculated using the CKD EPI equation.     This calculation has not been validated in all clinical situations.     eGFR's persistently <90 mL/min signify possible Chronic Kidney     Disease.  PROTIME-INR     Status: Abnormal   Collection Time    06/18/13  1:42 PM      Result Value Ref Range   Prothrombin Time 18.8 (*) 11.6 - 15.2 seconds   INR 1.62 (*) 0.00 - 1.49  PROTIME-INR     Status: Abnormal  Collection Time    06/19/13  6:14 AM      Result Value Ref Range   Prothrombin Time 18.0 (*) 11.6 - 15.2 seconds   INR 1.53 (*) 0.00 - 1.49    Imaging: Ir Sinus/fist Tube Chk-non Gi  06/18/2013   CLINICAL DATA History of diverticular abscess and post percutaneous abscess drainage. Evaluate for residual fistula.  EXAM  SINUS TRACT INJECTION/FISTULOGRAM  Physician: Stephan Minister. Henn, MD  FLUOROSCOPY TIME 48 seconds  MEDICATIONS None  ANESTHESIA/SEDATION Moderate sedation time: None  PROCEDURE Patient was placed supine on the interventional table. The left pelvic drain was injected with contrast under fluoroscopy. The drain was flushed with normal saline at the end of the procedure.  FINDINGS Stable position of the pigtail catheter in the left hemipelvis. Drain injection demonstrates a small irregular fluid collection around the drain. There appears to be a small amount of contrast draining into the adjacent bowel. Overall, the fistula tract has become smaller and less conspicuous compared to the prior examination.  COMPLICATIONS None  IMPRESSION There continues to be a fistula tract between the abscess cavity and bowel. However, this fistula tract is smaller and much less conspicuous.  SIGNATURE  Electronically Signed   By: Markus Daft M.D.   On: 06/18/2013 13:16    Assessment:  1. Principal Problem: 2.   Colonic diverticular abscess 3. Active Problems: 4.   HYPOTHYROIDISM 5.   Atrial fibrillation 6.   DIASTOLIC HEART FAILURE, CHRONIC 7.   COPD with emphysema 8.   Osteoarthritis 9.   Oxygen dependent 10.   Chronic gout due to renal impairment 11.   Chronic kidney disease (CKD), stage IV  12.   Unspecified protein-calorie malnutrition 13.   Malnutrition of moderate degree 14.   Plan:  1. INR subtherapeutic. No plans for surgery at this time. Unclear if surgery wants to take in the near future or not. Would recommend IV heparin until decision to either operate or defer is made. If decision not to operate, then could restart warfarin.  Time Spent Directly with Patient:  15 minutes  Length of Stay:  LOS: 13 days   Pixie Casino, MD, [facility] Attending Cardiologist [facility]  Aniyia Rane C 06/19/2013, 10:12 AM

## 2013-06-19 NOTE — Progress Notes (Signed)
Subjective: Has only mild LLQ abdominal pain Passing flatus and having BM's  Objective: Vital signs in last 24 hours: Temp:  [97.9 F (36.6 C)-98 F (36.7 C)] 98 F (36.7 C) (03/11 2956) Pulse Rate:  [70-93] 86 (03/11 0614) Resp:  [18] 18 (03/11 0614) BP: (128-141)/(55-70) 141/55 mmHg (03/11 0614) SpO2:  [96 %-99 %] 96 % (03/11 0614) Weight:  [181 lb 14.1 oz (82.5 kg)] 181 lb 14.1 oz (82.5 kg) (03/11 0651) Last BM Date: 06/17/13  Intake/Output from previous day: 03/10 0701 - 03/11 0700 In: 245 [P.O.:240] Out: 50 [Drains:50] Intake/Output this shift:   Abdomen soft, drain with stool Mildly tender in the LLQ Skin around drain with mild erythema   Lab Results:   Recent Labs  06/18/13 0726  WBC 8.5  HGB 10.3*  HCT 34.0*  PLT 247   BMET  Recent Labs  06/17/13 0556 06/18/13 0726  NA 140 141  K 4.9 4.4  CL 106 106  CO2 24 23  GLUCOSE 67* 75  BUN 34* 30*  CREATININE 1.52* 1.53*  CALCIUM 8.7 8.5   PT/INR  Recent Labs  06/18/13 1342 06/19/13 0614  LABPROT 18.8* 18.0*  INR 1.62* 1.53*   ABG No results found for this basename: PHART, PCO2, PO2, HCO3,  in the last 72 hours  Studies/Results: Ir Sinus/fist Tube Chk-non Gi  06/18/2013   CLINICAL DATA History of diverticular abscess and post percutaneous abscess drainage. Evaluate for residual fistula.  EXAM SINUS TRACT INJECTION/FISTULOGRAM  Physician: Rachelle Hora. Henn, MD  FLUOROSCOPY TIME 48 seconds  MEDICATIONS None  ANESTHESIA/SEDATION Moderate sedation time: None  PROCEDURE Patient was placed supine on the interventional table. The left pelvic drain was injected with contrast under fluoroscopy. The drain was flushed with normal saline at the end of the procedure.  FINDINGS Stable position of the pigtail catheter in the left hemipelvis. Drain injection demonstrates a small irregular fluid collection around the drain. There appears to be a small amount of contrast draining into the adjacent bowel. Overall, the  fistula tract has become smaller and less conspicuous compared to the prior examination.  COMPLICATIONS None  IMPRESSION There continues to be a fistula tract between the abscess cavity and bowel. However, this fistula tract is smaller and much less conspicuous.  SIGNATURE  Electronically Signed   By: Richarda Overlie M.D.   On: 06/18/2013 13:16    Anti-infectives: Anti-infectives   Start     Dose/Rate Route Frequency Ordered Stop   06/17/13 1000  vancomycin (VANCOCIN) IVPB 750 mg/150 ml premix     750 mg 150 mL/hr over 60 Minutes Intravenous Every 48 hours 06/17/13 0900     06/14/13 1000  vancomycin (VANCOCIN) IVPB 750 mg/150 ml premix  Status:  Discontinued     750 mg 150 mL/hr over 60 Minutes Intravenous  Once 06/14/13 0847 06/14/13 0848   06/13/13 1300  metroNIDAZOLE (FLAGYL) IVPB 500 mg  Status:  Discontinued     500 mg 100 mL/hr over 60 Minutes Intravenous Every 6 hours 06/13/13 1219 06/13/13 1238   06/13/13 1245  piperacillin-tazo (ZOSYN) NICU IV syringe 200 mg/mL  Status:  Discontinued     75 mg/kg  80.3 kg 60.2 mL/hr over 30 Minutes Intravenous Every 8 hours 06/13/13 1237 06/13/13 1243   06/13/13 1245  piperacillin-tazobactam (ZOSYN) IVPB 3.375 g     3.375 g 12.5 mL/hr over 240 Minutes Intravenous 3 times per day 06/13/13 1244     06/07/13 1800  cefTRIAXone (ROCEPHIN) 1 g in dextrose  5 % 50 mL IVPB  Status:  Discontinued     1 g 100 mL/hr over 30 Minutes Intravenous Every 24 hours 06/06/13 2134 06/07/13 0341   06/07/13 1600  azithromycin (ZITHROMAX) tablet 500 mg  Status:  Discontinued     500 mg Oral Every 24 hours 06/06/13 2134 06/07/13 0341   06/07/13 1145  oseltamivir (TAMIFLU) capsule 30 mg     30 mg Oral Daily 06/07/13 1133 06/11/13 1107   06/07/13 0600  ceFEPIme (MAXIPIME) 1 g in dextrose 5 % 50 mL IVPB  Status:  Discontinued     1 g 100 mL/hr over 30 Minutes Intravenous 3 times per day 06/07/13 0341 06/07/13 0353   06/07/13 0500  ceFEPIme (MAXIPIME) 1 g in dextrose 5 % 50  mL IVPB  Status:  Discontinued     1 g 100 mL/hr over 30 Minutes Intravenous Every 24 hours 06/07/13 0354 06/13/13 1237   06/07/13 0400  vancomycin (VANCOCIN) 1,250 mg in sodium chloride 0.9 % 250 mL IVPB  Status:  Discontinued     1,250 mg 166.7 mL/hr over 90 Minutes Intravenous Every 24 hours 06/07/13 0353 06/10/13 0508   06/07/13 0215  metroNIDAZOLE (FLAGYL) IVPB 500 mg     500 mg 100 mL/hr over 60 Minutes Intravenous  Once 06/07/13 0205 06/07/13 0302   06/06/13 2130  cefTRIAXone (ROCEPHIN) 1 g in dextrose 5 % 50 mL IVPB     1 g 100 mL/hr over 30 Minutes Intravenous  Once 06/06/13 2128 06/07/13 0004   06/06/13 2130  azithromycin (ZITHROMAX) 500 mg in dextrose 5 % 250 mL IVPB     500 mg 250 mL/hr over 60 Minutes Intravenous  Once 06/06/13 2128 06/07/13 0122      Assessment/Plan: s/p * No surgery found * Diverticulitis with fistula  Will continue conservative management with the fistula appearing smaller on drain study.  Will keep on full liquids until consistency of the drain improves. Hopefully can avoid exploration and colostomy unless her skin at the drain site worsens  LOS: 13 days    Javontay Vandam A 06/19/2013

## 2013-06-19 NOTE — Progress Notes (Signed)
IP Rehab  I met with the patient a bedside and began  discussions of  her post acute rehab options. She desires to come for IP rehab when medically ready.   Will monitor for medical readiness and bed availability.  Please call me with questions today, and my co-worker Danne Baxter on Thursday and Friday [phone]).    [facility] Admissions Coordinator Cell [phone] Office [phone]

## 2013-06-20 ENCOUNTER — Inpatient Hospital Stay
Admission: AD | Admit: 2013-06-20 | Discharge: 2013-06-28 | Disposition: A | Discharge status: Nursing Home (Includes ICF) | Payer: Self-pay | Source: Ambulatory Visit | Attending: Internal Medicine | Admitting: Internal Medicine

## 2013-06-20 DIAGNOSIS — E039 Hypothyroidism, unspecified: Secondary | ICD-10-CM

## 2013-06-20 DIAGNOSIS — E440 Moderate protein-calorie malnutrition: Secondary | ICD-10-CM

## 2013-06-20 DIAGNOSIS — K5720 Diverticulitis of large intestine with perforation and abscess without bleeding: Secondary | ICD-10-CM

## 2013-06-20 DIAGNOSIS — L0291 Cutaneous abscess, unspecified: Secondary | ICD-10-CM

## 2013-06-20 DIAGNOSIS — K922 Gastrointestinal hemorrhage, unspecified: Secondary | ICD-10-CM

## 2013-06-20 LAB — PROTIME-INR
INR: 1.76 — AB (ref 0.00–1.49)
Prothrombin Time: 20 seconds — ABNORMAL HIGH (ref 11.6–15.2)

## 2013-06-20 LAB — HEPARIN LEVEL (UNFRACTIONATED): HEPARIN UNFRACTIONATED: 0.13 [IU]/mL — AB (ref 0.30–0.70)

## 2013-06-20 MED ORDER — PRO-STAT SUGAR FREE PO LIQD: 30.0000 mL | ORAL | Status: DC

## 2013-06-20 MED ORDER — GLUCERNA SHAKE PO LIQD: 237.0000 mL | Freq: Two times a day (BID) | ORAL | Status: DC

## 2013-06-20 MED ORDER — PIPERACILLIN-TAZOBACTAM 3.375 G IVPB: 3.3750 g | Freq: Three times a day (TID) | INTRAVENOUS | Status: DC

## 2013-06-20 MED ORDER — TRAMADOL HCL 50 MG PO TABS: 25.0000 mg | ORAL_TABLET | Freq: Two times a day (BID) | ORAL | Status: DC | PRN

## 2013-06-20 MED ORDER — HEPARIN (PORCINE) IN NACL 100-0.45 UNIT/ML-% IJ SOLN: 1250.0000 [IU]/h | INTRAMUSCULAR | Status: DC

## 2013-06-20 MED ORDER — BARRIER CREAM NON-SPECIFIED: 1.0000 "application " | TOPICAL_CREAM | Freq: Three times a day (TID) | TOPICAL | Status: AC | PRN

## 2013-06-20 MED ORDER — HYDROCODONE-ACETAMINOPHEN 5-325 MG PO TABS: 2.0000 | ORAL_TABLET | ORAL | Status: DC | PRN

## 2013-06-20 MED ORDER — VANCOMYCIN HCL IN DEXTROSE 750-5 MG/150ML-% IV SOLN
750.0000 mg | INTRAVENOUS | Status: DC
Start: 2013-06-20 — End: 2013-06-29

## 2013-06-20 NOTE — Progress Notes (Signed)
UR completed Akeyla Molden K. Erial Fikes, RN, BSN, MSHL, CCM  06/20/2013 1:56 PM

## 2013-06-20 NOTE — Progress Notes (Signed)
Subjective: No complaints this morning Pain controlled Had another BM  Objective: Vital signs in last 24 hours: Temp:  [97.7 F (36.5 C)-98.5 F (36.9 C)] 97.7 F (36.5 C) (03/12 0532) Pulse Rate:  [70-88] 82 (03/12 0929) Resp:  [18-20] 20 (03/12 0532) BP: (120-137)/(50-69) 137/61 mmHg (03/12 0929) SpO2:  [95 %-98 %] 95 % (03/12 0929) Weight:  [185 lb 13.6 oz (84.3 kg)] 185 lb 13.6 oz (84.3 kg) (03/12 0532) Last BM Date: 06/19/13  Intake/Output from previous day: 03/11 0701 - 03/12 0700 In: 1545.5 [P.O.:1200; I.V.:45.5; IV Piggyback:300] Out: 21 [Drains:20; Stool:1] Intake/Output this shift:    Abdomen soft, stool in drain, skin around drain stable  Lab Results:   Recent Labs  06/18/13 0726  WBC 8.5  HGB 10.3*  HCT 34.0*  PLT 247   BMET  Recent Labs  06/18/13 0726  NA 141  K 4.4  CL 106  CO2 23  GLUCOSE 75  BUN 30*  CREATININE 1.53*  CALCIUM 8.5   PT/INR  Recent Labs  06/19/13 0614 06/20/13 0609  LABPROT 18.0* 20.0*  INR 1.53* 1.76*   ABG No results found for this basename: PHART, PCO2, PO2, HCO3,  in the last 72 hours  Studies/Results: Ir Sinus/fist Tube Chk-non Gi  06/18/2013   CLINICAL DATA History of diverticular abscess and post percutaneous abscess drainage. Evaluate for residual fistula.  EXAM SINUS TRACT INJECTION/FISTULOGRAM  Physician: Rachelle HoraAdam R. Henn, MD  FLUOROSCOPY TIME 48 seconds  MEDICATIONS None  ANESTHESIA/SEDATION Moderate sedation time: None  PROCEDURE Patient was placed supine on the interventional table. The left pelvic drain was injected with contrast under fluoroscopy. The drain was flushed with normal saline at the end of the procedure.  FINDINGS Stable position of the pigtail catheter in the left hemipelvis. Drain injection demonstrates a small irregular fluid collection around the drain. There appears to be a small amount of contrast draining into the adjacent bowel. Overall, the fistula tract has become smaller and less  conspicuous compared to the prior examination.  COMPLICATIONS None  IMPRESSION There continues to be a fistula tract between the abscess cavity and bowel. However, this fistula tract is smaller and much less conspicuous.  SIGNATURE  Electronically Signed   By: Richarda OverlieAdam  Henn M.D.   On: 06/18/2013 13:16    Anti-infectives: Anti-infectives   Start     Dose/Rate Route Frequency Ordered Stop   06/17/13 1000  vancomycin (VANCOCIN) IVPB 750 mg/150 ml premix     750 mg 150 mL/hr over 60 Minutes Intravenous Every 48 hours 06/17/13 0900     06/14/13 1000  vancomycin (VANCOCIN) IVPB 750 mg/150 ml premix  Status:  Discontinued     750 mg 150 mL/hr over 60 Minutes Intravenous  Once 06/14/13 0847 06/14/13 0848   06/13/13 1300  metroNIDAZOLE (FLAGYL) IVPB 500 mg  Status:  Discontinued     500 mg 100 mL/hr over 60 Minutes Intravenous Every 6 hours 06/13/13 1219 06/13/13 1238   06/13/13 1245  piperacillin-tazo (ZOSYN) NICU IV syringe 200 mg/mL  Status:  Discontinued     75 mg/kg  80.3 kg 60.2 mL/hr over 30 Minutes Intravenous Every 8 hours 06/13/13 1237 06/13/13 1243   06/13/13 1245  piperacillin-tazobactam (ZOSYN) IVPB 3.375 g     3.375 g 12.5 mL/hr over 240 Minutes Intravenous 3 times per day 06/13/13 1244     06/07/13 1800  cefTRIAXone (ROCEPHIN) 1 g in dextrose 5 % 50 mL IVPB  Status:  Discontinued     1 g  100 mL/hr over 30 Minutes Intravenous Every 24 hours 06/06/13 2134 06/07/13 0341   06/07/13 1600  azithromycin (ZITHROMAX) tablet 500 mg  Status:  Discontinued     500 mg Oral Every 24 hours 06/06/13 2134 06/07/13 0341   06/07/13 1145  oseltamivir (TAMIFLU) capsule 30 mg     30 mg Oral Daily 06/07/13 1133 06/11/13 1107   06/07/13 0600  ceFEPIme (MAXIPIME) 1 g in dextrose 5 % 50 mL IVPB  Status:  Discontinued     1 g 100 mL/hr over 30 Minutes Intravenous 3 times per day 06/07/13 0341 06/07/13 0353   06/07/13 0500  ceFEPIme (MAXIPIME) 1 g in dextrose 5 % 50 mL IVPB  Status:  Discontinued     1  g 100 mL/hr over 30 Minutes Intravenous Every 24 hours 06/07/13 0354 06/13/13 1237   06/07/13 0400  vancomycin (VANCOCIN) 1,250 mg in sodium chloride 0.9 % 250 mL IVPB  Status:  Discontinued     1,250 mg 166.7 mL/hr over 90 Minutes Intravenous Every 24 hours 06/07/13 0353 06/10/13 0508   06/07/13 0215  metroNIDAZOLE (FLAGYL) IVPB 500 mg     500 mg 100 mL/hr over 60 Minutes Intravenous  Once 06/07/13 0205 06/07/13 0302   06/06/13 2130  cefTRIAXone (ROCEPHIN) 1 g in dextrose 5 % 50 mL IVPB     1 g 100 mL/hr over 30 Minutes Intravenous  Once 06/06/13 2128 06/07/13 0004   06/06/13 2130  azithromycin (ZITHROMAX) 500 mg in dextrose 5 % 250 mL IVPB     500 mg 250 mL/hr over 60 Minutes Intravenous  Once 06/06/13 2128 06/07/13 0122      Assessment/Plan: s/p * No surgery found *  Diverticulitis with fistula  Seems to be controlled well with drain.  Hopefully output will continue to decrease.  Will continue full liquids for now  LOS: 14 days    Jeoffrey Eleazer A 06/20/2013

## 2013-06-20 NOTE — Discharge Summary (Signed)
Physician Discharge Summary  [name]:[id number] DOB: 15-Nov-1930 DOA: 06/06/2013  PCP: Heidi Levans, MD  Admit date: 06/06/2013 Discharge date: 06/20/2013  Time spent: >30 minutes  Recommendations for Outpatient Follow-up:  -Follow up in 10 days with [facility] -Keep drain in place -Continue antibiotics -Follow coumadin level and discontinue heparin drip once therapeutic -Physical rehab as per facility protocol  Discharge Diagnoses:  Principal Problem:   Colonic diverticular abscess Active Problems:   HYPOTHYROIDISM   Atrial fibrillation   DIASTOLIC HEART FAILURE, CHRONIC   COPD with emphysema   Osteoarthritis   Oxygen dependent   Chronic gout due to renal impairment   Chronic kidney disease (CKD), stage IV    Unspecified protein-calorie malnutrition   Malnutrition of moderate degree   Discharge Condition: stable and improved. Will discharge to [facility] for rehabilitation, continue IV antibiotics and transition from heparin drip to coumadin. Patient to keep drain in place and to follow with general surgery in 10 days as an outpatient for final decision regarding colonic abscess and fistula repair (elective surgery)  Diet recommendation: heart healthy/ Full liquid diet  Filed Weights   06/18/13 0542 06/19/13 0651 06/20/13 0532  Weight: 83.507 kg (184 lb 1.6 oz) 82.5 kg (181 lb 14.1 oz) 84.3 kg (185 lb 13.6 oz)    History of present illness:  80 y.o. female with Past medical history of diabetes mellitus, obesity, COPD, chronic diastolic dysfunction, chronic kidney disease, obstructive sleep apnea, on home oxygen, hypothyroidism.  The patient is coming from home.  The patient presented with lethargy and altered mental status that the family has noted today there was no fall no trauma no injury no recent change in her medications.  Patient has cough since last one week without any expectoration. Denies any shortness of breath chest pain palpitation nausea  vomiting diarrhea or constipation or burning urination or active bleeding or focal neurological deficit.  She has chronic abdominal pain which is present since last few months and progressively gradually worsened.  She is compliant with her medication   Hospital Course:  Toxic encephalopathy due to infectious etiology:  - Due to diverticular abscess, PNA and influenza. Now resolved, no events overnight.  - diet advance and kept to full liquid; no surgery for now.  - PRN antiemetics and pain meds (oral analgesics) - Pt refused SNF; plan is for [facility] for rehab   Diverticular abscess and fistula:  - Status post drain drain replacement on 3.4.2015. Now with feculent material around drain.  - Continue current IV antibiotics (zosyn and vancomycin).  - Ct abd and pelvis: original fluid collection adjacent to the sigmoid colon is not measurable with drainage catheter in place.  -plan is to continue conservative management for now  -follow up with surgery in 1-2 weeks as an outpatient  - Pre-op assessment: COPD O2L dependant (biggest risk factor) CKD III, a.fib on chronic anticoagulation and chronic diastolic HF. I discuss with Her and her daughter [name]) that due to her multiple co-morbidities she is high risk for surgery, she might pass with the surgical intervention, she also has a high risk of being on the ventilator longer than average due to her COPD, And they are ok with risks and would like to proceed with surgery if needed  - Surgery reevaluated Ct scan of abdomen and it showed that fistula and drainage has improved, will allow time to heal. Re-[place] in 1-2 weeks.  -will transfer to [facility] in between to continue IV abx's, start rehabilitation and  continue bridging heparin and coumadin transition  HCAP/influenza:  - Completed HCAP and Influenza treatment therapy.  - Continue chronic oxygen supplementation.   Atrial fibrillation: rate controlled.  -Her CHAD2 score is 3, her CHAD2VASC2 is  5.  -continue transition   COPD with chronic resp failure:  - Stable and no wheezing  - Will continue home inhalers  - Chronic prednisone and oxygen supplementation.   AKI CKD: stable and at baseline. Will monitor  -Follow BMET  Chronic diastolic heart failure: compensated at this moment.  -Last EF 60-65%  -will continue demadex.  -strict I's and O's, daily weight . -Low sodium diet  Moderate protein calorie malnutrition -continue prostat and glucerna  Physical deconditioning -will discharge to [facility] for physical rehab   Procedures:  See below for x-ray reports Abscess drain placement by IR (2/27) and replacement on (3/3)  Consultations:  General surgery  Cardiology   Discharge Exam: Filed Vitals:   06/20/13 0929  BP: 137/61  Pulse: 82  Temp:   Resp:    General: Alert, awake, oriented x3, in no acute distress.  HEENT: No bruits, no goiter.  Heart: Regular rate and rhythm, without murmurs, rubs, gallops.  Lungs: Good air movement, clear  Abdomen: with Left flank percutaneous drain in place; no guarding; positive BS   Discharge Instructions  Discharge Orders   Future Appointments Provider Department Dept Phone   07/09/2013 11:15 AM Storm Frisk, MD [facility] [place] [phone]   08/16/2013 11:00 AM Laurey Morale, MD [facility] [phone]   Future Orders Complete By Expires   Diet - low sodium heart healthy  As directed    Discharge instructions  As directed    Comments:     -Follow up in 10 days with [facility] surgery -Keep drain in place -Continue antibiotics -Follow coumadin level and discontinue heparin drip once therapeutic -Physical rehab as per facility protocol       Medication List         albuterol (2.5 MG/3ML) 0.083% nebulizer solution  Commonly known as:  PROVENTIL  Take 3 mLs (2.5 mg total) by nebulization 2 (two) times daily. Dx: 492.8 COPD with Emphysema     barrier cream Crea   Commonly known as:  non-specified  Apply 1 application topically 3 (three) times daily as needed (place around drain area to keep it dry and clean).     bisoprolol 10 MG tablet  Commonly known as:  ZEBETA  Take 5 mg by mouth daily.     feeding supplement (GLUCERNA SHAKE) Liqd  Take 237 mLs by mouth 2 (two) times daily between meals.     feeding supplement (PRO-STAT SUGAR FREE 64) Liqd  Take 30 mLs by mouth daily.     guaifenesin 400 MG Tabs tablet  Commonly known as:  HUMIBID E  Take 400 mg by mouth 2 (two) times daily.     heparin 100-0.45 UNIT/ML-% infusion  Inject 1,250 Units/hr into the vein continuous.     HYDROcodone-acetaminophen 5-325 MG per tablet  Commonly known as:  NORCO/VICODIN  Take 2 tablets by mouth every 4 (four) hours as needed for moderate pain.     levothyroxine 50 MCG tablet  Commonly known as:  SYNTHROID, LEVOTHROID  Take 50 mcg by mouth daily.     multivitamin with minerals Tabs tablet  Take 1 tablet by mouth daily.     piperacillin-tazobactam 3.375 GM/50ML IVPB  Commonly known as:  ZOSYN  Inject 50 mLs (  3.375 g total) into the vein every 8 (eight) hours.     potassium chloride SA 20 MEQ tablet  Commonly known as:  K-DUR,KLOR-CON  Take 20 mEq by mouth daily.     predniSONE 10 MG tablet  Commonly known as:  DELTASONE  Take 10 mg by mouth daily with breakfast.     ranitidine 150 MG capsule  Commonly known as:  ZANTAC  Take 150 mg by mouth every evening.     tiotropium 18 MCG inhalation capsule  Commonly known as:  SPIRIVA HANDIHALER  Place 1 capsule (18 mcg total) into inhaler and inhale daily.     torsemide 20 MG tablet  Commonly known as:  DEMADEX  Take 80 mg by mouth daily.     traMADol 50 MG tablet  Commonly known as:  ULTRAM  Take 0.5 tablets (25 mg total) by mouth every 12 (twelve) hours as needed for moderate pain.     Vancomycin 750 MG/150ML Soln  Commonly known as:  VANCOCIN  Inject 150 mLs (750 mg total) into the vein every  other day.     warfarin 2 MG tablet  Commonly known as:  COUMADIN  Take 2 mg by mouth daily at 6 PM.       Allergies  Allergen Reactions  . Sulfonamide Derivatives Other (See Comments)    Childhood reaction       Follow-up Information   Follow up with [facility]. (RN/NA/PT)    Contact information:   [address] [place] [address] [phone]       Follow up with [facility] A, MD. Schedule an appointment as soon as possible for a visit in 10 days.   Specialty:  General Surgery   Contact information:   [address] [place] [address] [phone]       The results of significant diagnostics from this hospitalization (including imaging, microbiology, ancillary and laboratory) are listed below for reference.    Significant Diagnostic Studies: Ct Abdomen Pelvis Wo Contrast  06/14/2013   CLINICAL DATA:  Follow-up abscess drain placement.  EXAM: CT ABDOMEN AND PELVIS WITHOUT CONTRAST  TECHNIQUE: Multidetector CT imaging of the abdomen and pelvis was performed following the standard protocol without intravenous contrast.  COMPARISON:  CT IMAGE GUIDED DRAINAGE BY PERCUTANEOUS CATHETER dated 06/07/2013; CT ABD/PELV WO CM dated 06/07/2013  FINDINGS: Left lower quadrant drainage catheter is in place. The previously seen fluid collection adjacent to the sigmoid colon has decreased significantly in size. Now, there is a fluid and gas tracking along the left iliacus and iliopsoas muscles. Given its proximity to the muscles, this is difficult to measure but is new since prior study. This may be tracking along the catheter tract. This also may be related to catheter flushings. The original fluid collection is not measurable currently.  Cardiomegaly.  Minimal atelectasis in the lung bases.  No effusions.  Layering gallstones within the gallbladder. Mildly nodular contours of the liver suggest cirrhosis, unchanged. Spleen, pancreas, adrenals have  an unremarkable unenhanced appearance. Mild scarring and cortical thinning within the kidneys. Small bilateral hypodensities likely reflect renal cysts.  Sigmoid diverticulosis.  Small bowel is decompressed.  IMPRESSION: The original fluid collection adjacent to the sigmoid colon is not measurable with drainage catheter in place. There is fluid and gas tracking along the left iliacus and iliopsoas muscles, possibly along the tube tract or related to tube flushings.   Electronically Signed   By: Charlett Nose M.D.  On: 06/14/2013 19:52   Ct Abdomen Pelvis Wo Contrast  06/07/2013   CLINICAL DATA:  Altered mental status, fever and weakness. Abdominal pain, nausea and vomiting.  EXAM: CT ABDOMEN AND PELVIS WITHOUT CONTRAST  TECHNIQUE: Multidetector CT imaging of the abdomen and pelvis was performed following the standard protocol without intravenous contrast.  COMPARISON:  CT of the abdomen and pelvis performed 03/20/2013, and abdominal ultrasound performed 03/21/2013  FINDINGS: Mild left basilar atelectasis is noted.  The mildly nodular contour of the liver may reflect mild cirrhotic change. A few small stones are seen layering dependently within the gallbladder; the gallbladder is otherwise unremarkable in appearance. The pancreas and adrenal glands are unremarkable.  Mild bilateral renal atrophy is noted. A 1.6 cm cyst is noted arising along the posterior aspect of the right kidney. There is no evidence of hydronephrosis. No renal or ureteral stones are seen. No significant perinephric stranding is appreciated.  No free fluid is identified. The small bowel is unremarkable in appearance. The stomach is within normal limits. No acute vascular abnormalities are seen. There is diffuse tortuosity of the abdominal aorta, with slight ectasia of the distal abdominal aorta. Diffuse calcification is seen along the abdominal aorta and its branches.  The appendix is normal in caliber, without evidence for appendicitis.  Contrast progresses to the level of the mid transverse colon. Diffuse diverticulosis is noted along the sigmoid colon.  There is a enlarged 4.6 x 3.6 x 2.9 cm collection of fluid and air tracking adjacent to the uterus and left side of the bladder, containing minimal high density debris. There is also an enlarging collection of fluid and air noted along the upper left pelvic sidewall, measuring 6.3 x 2.5 x 4.4 cm. These are directly adjacent to the left ovary.  On comparison with multiple prior CTs, the patient did not have a large colonic diverticulum in this region. Stool within the pelvic sidewall collection on the prior study likely reflected a fistulous connection to a complex abscess in the pelvis. An inflamed diverticulum would not demonstrate this degree of enlargement over time. Surrounding soft tissue inflammation is seen. Communication between these abscesses is not well assessed.  The bladder is moderately distended and grossly unremarkable in appearance, though displaced to the right by the patient's abscess. The uterus is difficult to fully assess, as it extends directly adjacent to the abscess, but appears grossly unremarkable, aside from a single tiny focus of air at the cervix, likely within normal limits. No inguinal lymphadenopathy is seen.  No acute osseous abnormalities are identified. 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  IMPRESSION: 1. Enlarging mildly complex abscesses noted within the pelvis. 4.6 x 3.6 x 2.9 cm collection of fluid and air tracking adjacent to the uterus and left side of the bladder has increased in size from the prior study, and now contains minimal high density debris. An additional enlarging collection of fluid and air along the upper left pelvic sidewall measures 6.3 x 2.5 x 4.4 cm. Stool was noted in the pelvic sidewall collection on the prior study, likely reflecting a fistulous connection to the abscess. No large colonic diverticula were  present in this region on prior CTs to suggest a thick-walled diverticulum, and a colonic diverticulum would not demonstrate this degree of enlargement and irregularity over time. Surrounding soft tissue inflammation noted. 2. Diffuse diverticulosis along the sigmoid colon. 3. Cholelithiasis; gallbladder otherwise unremarkable in appearance. 4. Mild bilateral renal atrophy seen.  Small right renal cyst  noted. 5. Diffuse calcification along the abdominal aorta and its branches. 6. Mildly nodular contour of the liver may reflect mild cirrhotic change. 7. Mild left basilar atelectasis noted.  These results were called by telephone at the time of interpretation on 06/07/2013 at 1:51 AM to [facility] PA, who verbally acknowledged these results.   Electronically Signed   By: Roanna Raider M.D.   On: 06/07/2013 01:54   Dg Chest 2 View  06/06/2013   CLINICAL DATA:  Weakness cough  EXAM: CHEST  2 VIEW  COMPARISON:  03/21/2013 prior chest radiographs  FINDINGS: Cardiomegaly noted.  Left lower lobe opacity is suspicious for pneumonia.  There is no evidence of pulmonary edema, suspicious pulmonary nodule/mass, pleural effusion, or pneumothorax. No acute bony abnormalities are identified.  IMPRESSION: Probable left lower lobe pneumonia -radiographic followup to resolution recommended.  Cardiomegaly.   Electronically Signed   By: Laveda Abbe M.D.   On: 06/06/2013 21:22   Ct Head Wo Contrast  06/07/2013   CLINICAL DATA:  Altered mental status, weakness and fever. Abdominal pain, nausea and vomiting.  EXAM: CT HEAD WITHOUT CONTRAST  TECHNIQUE: Contiguous axial images were obtained from the base of the skull through the vertex without intravenous contrast.  COMPARISON:  None.  FINDINGS: There is no evidence of acute infarction, mass lesion, or intra- or extra-axial hemorrhage on CT.  Prominence of the ventricles and sulci reflects mild cortical volume loss. Scattered periventricular and subcortical white matter change likely  reflects small vessel ischemic microangiopathy. Mild cerebellar atrophy is noted.  The brainstem and fourth ventricle are within normal limits. The basal ganglia are unremarkable in appearance. The cerebral hemispheres demonstrate grossly normal gray-white differentiation. No mass effect or midline shift is seen.  There is no evidence of fracture; visualized osseous structures are unremarkable in appearance. The orbits are within normal limits. A mucus retention cyst or polyp is noted at the left maxillary sinus. There is opacification of the right side of the sphenoid sinus. The remaining paranasal sinuses and mastoid air cells are well-aerated. No significant soft tissue abnormalities are seen.  IMPRESSION: 1. No acute intracranial pathology seen on CT. 2. Mild cortical volume loss and scattered small vessel ischemic microangiopathy. 3. Mucus retention cyst or polyp at the left maxillary sinus; opacification of the right side of the sphenoid sinus.   Electronically Signed   By: Roanna Raider M.D.   On: 06/07/2013 01:26   Ir Sinus/fist Tube Chk-non Gi  06/18/2013   CLINICAL DATA History of diverticular abscess and post percutaneous abscess drainage. Evaluate for residual fistula.  EXAM SINUS TRACT INJECTION/FISTULOGRAM  Physician: Rachelle Hora. Henn, MD  FLUOROSCOPY TIME 48 seconds  MEDICATIONS None  ANESTHESIA/SEDATION Moderate sedation time: None  PROCEDURE Patient was placed supine on the interventional table. The left pelvic drain was injected with contrast under fluoroscopy. The drain was flushed with normal saline at the end of the procedure.  FINDINGS Stable position of the pigtail catheter in the left hemipelvis. Drain injection demonstrates a small irregular fluid collection around the drain. There appears to be a small amount of contrast draining into the adjacent bowel. Overall, the fistula tract has become smaller and less conspicuous compared to the prior examination.  COMPLICATIONS None  IMPRESSION  There continues to be a fistula tract between the abscess cavity and bowel. However, this fistula tract is smaller and much less conspicuous.  SIGNATURE  Electronically Signed   By: Richarda Overlie M.D.   On: 06/18/2013 13:16  Ir Catheter Tube Change  06/11/2013   CLINICAL DATA:  80 year old with a percutaneous abscess drain. The percutaneous drainage catheter is broken and needs replacement.  EXAM: EXCHANGE OF DRAINAGE CATHETER WITH FLUOROSCOPY  Physician: Rachelle Hora. Lowella Dandy, MD  MEDICATIONS: 1 mg versed, 50 mcg fentanyl. A radiology nurse monitored the patient for moderate sedation.  ANESTHESIA/SEDATION: Moderate sedation time: 12 min  FLUOROSCOPY TIME:  3 min and 6 seconds  PROCEDURE: The procedure was explained to the patient. The risks and benefits of the procedure were discussed and the patient's questions were addressed. Informed consent was obtained from the patient. Examination demonstrated that the hub of the catheter had been broken. Catheter was sutured in place and did not appear to be internally dislodged. The catheter was prepped and draped in a sterile fashion. Maximal barrier sterile technique was utilized including caps, mask, sterile gowns, sterile gloves, sterile drape, hand hygiene and skin antiseptic. Contrast was injected through the catheter. The catheter suture was removed and the catheter was removed over a Bentson wire. A 5 French Kumpe catheter was advanced over the wire and additional contrast was injected. A new 10 French catheter was placed over the Bentson wire and reconstituted in the pelvis. Additional contrast was injected through the catheter. Catheter was flushed with saline. Catheter was sutured to the skin with Prolene.  COMPLICATIONS: None  FINDINGS: There is contrast within the distal colon. After removal of the catheter, the wire preferentially went into the colon. The wire was pulled out of the colon. A new 10 French catheter was placed outside of the colon. Contrast injection  demonstrated a fistula connection to the sigmoid colon. Bloody purulent fluid was aspirated from the catheter.  IMPRESSION: Successful replacement of the left pelvic abscess drainage catheter.  There is fistula connection between the abscess collection and colon.   Electronically Signed   By: Richarda Overlie M.D.   On: 06/11/2013 14:19   Ct Image Guided Drainage By Percutaneous Catheter  06/07/2013   CLINICAL DATA:  80 year old female with a diverticular abscess, leukocytosis and fever. She is previously treated with antibiotics unsuccessfully. The abscess cavity has enlarged and now extends to the left pelvic sidewall. The percutaneous window is very small. CT guided drain placement will be attempted.  EXAM: CT GUIDED DRAINAGE OF deep pelvic diverticular ABSCESS  ANESTHESIA/SEDATION: 1 Mg IV Versed 50 mcg IV Fentanyl  Total Moderate Sedation Time:  25 minutes  PROCEDURE: The procedure, risks, benefits, and alternatives were explained to the patient. Questions regarding the procedure were encouraged and answered. The patient understands and consents to the procedure.  A planning axial CT scan was performed. Multiple repositionings were required before a suitable window could be found. An appropriate skin entry site was selected and marked.  The left lower quadrant was prepped with Betadinein a sterile fashion, and a sterile drape was applied covering the operative field. A sterile gown and sterile gloves were used for the procedure. Local anesthesia was provided with 1% Lidocaine.  A small dermatotomy was made. Under CT fluoroscopic guidance, a 18 gauge trocar needle was carefully advanced through the iliacus muscle closely adjacent to the inferior left ilium and into the the fluid and gas collection in the left pelvic sidewall. Care was taken to steady medial to the adjacent iliac vessels. A wire was then coiled in the fluid and gas collection. The tract was serially dilated to 70 [place] and a [place]  all-purpose drainage catheter was formed within the collection. Approximately 15  mL of purulent and bloody material was successfully aspirated.  The catheter was secured to the skin with 0 silk suture and connected to JP bulb suction. The patient tolerated the procedure well; there was no immediate complication.  COMPLICATIONS: None  FINDINGS: As above  IMPRESSION: 1. Technically successful placement of a 10 French percutaneous drainage catheter into the left pelvic sidewall abscess collection with aspiration of 15 mL of purulent, bloody fluid. The drain was left to JP bulb suction. Recommend repeat CT scan and/ tube injection under fluoroscopy to exclude fistulous connection to the adjacent sigmoid colon prior to tube removal.  Signed,  Sterling Big, MD  [facility]  [facility]   Electronically Signed   By: Malachy Moan M.D.   On: 06/07/2013 18:50    Microbiology: Recent Results (from the past 240 hour(s))  CLOSTRIDIUM DIFFICILE BY PCR     Status: None   Collection Time    06/13/13  1:58 PM      Result Value Ref Range Status   C difficile by pcr NEGATIVE  NEGATIVE Final  SURGICAL PCR SCREEN     Status: None   Collection Time    06/18/13 12:01 AM      Result Value Ref Range Status   MRSA, PCR NEGATIVE  NEGATIVE Final   Staphylococcus aureus NEGATIVE  NEGATIVE Final   Comment:            The Xpert SA Assay (FDA     approved for NASAL specimens     in patients over 32 years of age),     is one component of     a comprehensive surveillance     program.  Test performance has     been validated by [employer] for patients greater     than or equal to 64 year old.     It is not intended     to diagnose infection nor to     guide or monitor treatment.     Labs: Basic Metabolic Panel:  Recent Labs Lab 06/14/13 1152 06/15/13 0450 06/16/13 0439 06/17/13 0556 06/18/13 0726  NA 139 134* 137 140 141  K 5.5* 4.1 5.0 4.9 4.4   CL 99 97 98 106 106  CO2 25 20 23 24 23   GLUCOSE 161* 91 51* 67* 75  BUN 41* 37* 35* 34* 30*  CREATININE 1.73* 1.67* 1.53* 1.52* 1.53*  CALCIUM 9.0 8.4 8.8 8.7 8.5   CBC:  Recent Labs Lab 06/14/13 1152 06/15/13 0450 06/18/13 0726  WBC 10.5 8.3 8.5  HGB 11.2* 10.2* 10.3*  HCT 36.1 31.7* 34.0*  MCV 90.0 89.5 91.6  PLT 298 250 247   BNP: BNP (last 3 results)  Recent Labs  07/25/12 0822 12/05/12 1558 06/06/13 2244  PROBNP 399.0* 277.0* 1764.0*   CBG:  Recent Labs Lab 06/13/13 1638 06/13/13 2136 06/14/13 0631 06/14/13 0722 06/14/13 1102  GLUCAP 132* 109* 68* 109* 149*    Signed:  Armen Pickup  [facility] 06/20/2013, 2:57 PM

## 2013-06-20 NOTE — Consult Note (Signed)
Pharmacy Note-  Pharmacy Consult : Heparin  Latest Labs : Hematology :  Recent Labs  06/18/13 0726 06/18/13 1342 06/19/13 0614 06/19/13 2153 06/20/13 0609  HGB 10.3*  --   --   --   --   HCT 34.0*  --   --   --   --   PLT 247  --   --   --   --   LABPROT 20.1* 18.8* 18.0*  --  20.0*  INR 1.77* 1.62* 1.53*  --  1.76*  HEPARINUNFRC  --   --   --  0.20* 0.13*  CREATININE 1.53*  --   --   --   --    Assessment : 80 yo female with h/o Afib, Coumadin on hold while awaiting possible surgery, for heparin  Goal : Heparin level 0.3-0.7 units/ml.  Plan : Increase Heparin 1250 units/hr Check heparin level in 8 hours.  Geannie RisenGreg Amedeo Detweiler, PharmD, BCPS

## 2013-06-20 NOTE — Progress Notes (Signed)
Patient need IV site change today due to both IVs being placed on 06/16/2013. I have assess bilateral arms and patient has very small veins. Toni Amendourtney, RN to assess patient to see if assess can be obtained.

## 2013-06-20 NOTE — Progress Notes (Signed)
Report given to Harriett SineNancy, RN on [facility] [employer] [employer]).

## 2013-06-20 NOTE — Progress Notes (Signed)
NUTRITION FOLLOW UP  Intervention:   Continue Glucerna Shakes BID Continue Pro-stat once daily Continue Multivitamin with minerals Encouraged snacks in between meals  Nutrition Dx:   Inadequate oral intake related to restricted diet as evidenced by 6% weight loss in one month.   Goal:   Pt to meet >/= 90% of their estimated nutrition needs; unmet  Monitor:   PO intake, diet advancement, weight trends, labs, plan of care  Assessment:   80 y.o. female with Past medical history of diabetes mellitus, obesity, COPD, chronic diastolic dysfunction, chronic kidney disease, obstructive sleep apnea, on home oxygen, hypothyroidism. The patient presented with lethargy and altered mental status. Pt found to have diverticular abscess.  Pt did not end up undergoing surgery during admission. Pt reports feeling much better but continues to have a poor appetite. She is still on full liquid diet and reports that she is only eating soup at meals; she is tired of sweet foods. Pt enjoys the [employer] Shakes and has been drinking 2 daily. No further weight loss since admission. Encouraged pt to snack in between meals due to inadequate intake at meals. Note plans for discharge, discharge summary in chart.   Height: Ht Readings from Last 1 Encounters:  06/07/13 5\' 3"  (1.6 m)    Weight Status:   Wt Readings from Last 1 Encounters:  06/20/13 185 lb 13.6 oz (84.3 kg)    Re-estimated needs:  Kcal: 1600-1800  Protein: 80-95 grams  Fluid: 2 L/day  Skin: +1 RLE and LLE edema; abdominal closed system drain  Diet Order: Full Liquid   Intake/Output Summary (Last 24 hours) at 06/20/13 1604 Last data filed at 06/20/13 1500  Gross per 24 hour  Intake 1490.48 ml  Output     21 ml  Net 1469.48 ml    Last BM: 3/11   Labs:   Recent Labs Lab 06/16/13 0439 06/17/13 0556 06/18/13 0726  NA 137 140 141  K 5.0 4.9 4.4  CL 98 106 106  CO2 23 24 23   BUN 35* 34* 30*  CREATININE 1.53* 1.52* 1.53*   CALCIUM 8.8 8.7 8.5  GLUCOSE 51* 67* 75    CBG (last 3)  No results found for this basename: GLUCAP,  in the last 72 hours  Scheduled Meds: . albuterol  2.5 mg Nebulization BID  . bisoprolol  5 mg Oral Daily  . budesonide (PULMICORT) nebulizer solution  0.25 mg Nebulization BID  . feeding supplement (GLUCERNA SHAKE)  237 mL Oral BID BM  . feeding supplement (PRO-STAT SUGAR FREE 64)  30 mL Oral Q24H  . guaiFENesin  600 mg Oral BID  . levothyroxine  50 mcg Oral QAC breakfast  . multivitamin with minerals  1 tablet Oral Daily  . piperacillin-tazobactam (ZOSYN)  IV  3.375 g Intravenous 3 times per day  . predniSONE  10 mg Oral Q breakfast  . tiotropium  18 mcg Inhalation Daily  . vancomycin  750 mg Intravenous Q48H  . Warfarin - Pharmacist Dosing Inpatient   Does not apply q1800    Continuous Infusions: . dextrose 5 % and 0.45% NaCl 125 mL/hr at 06/15/13 0700  . heparin 1,250 Units/hr (06/20/13 0751)    Ian Malkineanne Barnett RD, LDN Inpatient Clinical Dietitian Pager: [phone] After Hours Pager: [phone]

## 2013-06-20 NOTE — Progress Notes (Addendum)
Patient transferred to room 5734 by wheel chair. Belongings taken to room. Son at bedside. Prescriptions for Vicodin and Tramadol given to Harriett SineNancy, RN on [facility] unit who then handed prescriptions to son.

## 2013-06-20 NOTE — Progress Notes (Addendum)
Heparin drip infusing at 10.705ml/hr. Rate increased to 12.895ml/hr which is 1250units/hr. Cosigned by Nino Glowourtney Gentry, RN

## 2013-06-20 NOTE — Progress Notes (Signed)
I continue to follow pt's progress. [facility]. [phone]

## 2013-06-20 NOTE — Progress Notes (Signed)
Discharge instructions discussed with patient and son who is at bedside. Discussed diet and follow up appt with Dr. Magnus IvanBlackman. IV's left in place due to patient receiving heparin gtt and IV antibiotics. Prescriptions for Vicodin and Tramadol will be sent up with patient when transferred to [facility] ([facility]).

## 2013-06-20 NOTE — Progress Notes (Signed)
Physical Therapy Treatment Patient Details Name: Nolberto HanlonCynthia A Alcantar MRN: [id number] DOB: 04/19/1930 Today's Date: 06/20/2013 Time: [id number] PT Time Calculation (min): 25 min  PT Assessment / Plan / Recommendation  History of Present Illness Ms. Shellia CarwinCynthia Carp is a 80 year old woman with PMH of GERD, dCHF, CKD, hyperlipidemia, COPD, a-fib, PVD, hypothyroidism, diverticulosis, and multiple episodes of C diff who presents with a one-day history of fever, chills, weakness, and vomiting x1. She woke up this morning feeling cold and weak, and vomited once after having a few sips of water. Her daughter checked her temperature and found it was 101.4.    PT Comments   Pt progressing with transfers and gait this session and seems more upbeat with the fact that she did not have sx. Pt encouraged to continue HEp throughout the day and OOB for all meals. Will continue to follow.  Follow Up Recommendations  Home health PT;Supervision/Assistance - 24 hour;[facility]     Does the patient have the potential to tolerate intense rehabilitation     Barriers to Discharge        Equipment Recommendations       Recommendations for Other Services    Frequency     Progress towards PT Goals Progress towards PT goals: Progressing toward goals  Plan Discharge plan needs to be updated    Precautions / Restrictions Precautions Precautions: Fall Precaution Comments: pt with L flank drain, incontinent of stool   Pertinent Vitals/Pain 5/10 left flank, premedicated with report of pain decreasing throughout session    Mobility  Bed Mobility General bed mobility comments: pt up in chair on arrival Transfers Overall transfer level: Needs assistance Transfers: Sit to/from Stand Sit to Stand: Min guard General transfer comment: cueing for hand placement and safety  Ambulation/Gait Ambulation/Gait assistance: Min assist Ambulation Distance (Feet): 24 Feet Assistive device: Rolling walker (2 wheeled) Gait  Pattern/deviations: Step-through pattern;Decreased stride length;Trunk flexed;Shuffle Gait velocity interpretation: <1.8 ft/sec, indicative of risk for recurrent falls General Gait Details: chair pulled behind pt with cues throughout for posture and position in RW. slow shuffle gait but increased distance    Exercises General Exercises - Lower Extremity Long Arc Quad: AROM;Seated;Both;20 reps Hip ABduction/ADduction: AROM;AAROM;Seated;Right;Left;20 reps (AAROM on LLE) Hip Flexion/Marching: AROM;Seated;Right;Left;20 reps;AAROM (AAROM on LLE)   PT Diagnosis:    PT Problem List:   PT Treatment Interventions:     PT Goals (current goals can now be found in the care plan section)    Visit Information  Last PT Received On: 06/20/13 Assistance Needed: +1 History of Present Illness: Ms. Shellia CarwinCynthia Kingry is a 80 year old woman with PMH of GERD, dCHF, CKD, hyperlipidemia, COPD, a-fib, PVD, hypothyroidism, diverticulosis, and multiple episodes of C diff who presents with a one-day history of fever, chills, weakness, and vomiting x1. She woke up this morning feeling cold and weak, and vomited once after having a few sips of water. Her daughter checked her temperature and found it was 101.4.     Subjective Data      Cognition  Cognition Arousal/Alertness: Awake/alert Behavior During Therapy: WFL for tasks assessed/performed Overall Cognitive Status: Within Functional Limits for tasks assessed    Balance     End of Session PT - End of Session Equipment Utilized During Treatment: Oxygen Activity Tolerance: Patient tolerated treatment well Patient left: in chair;with call bell/phone within reach;with family/visitor present Nurse Communication: Mobility status   GP     Delorse Lekabor, Delmore Sear Beth 06/20/2013, 12:49 PM Delaney MeigsMaija Tabor Latravia [place], PT [phone]

## 2013-06-21 ENCOUNTER — Other Ambulatory Visit (HOSPITAL_COMMUNITY): Payer: Self-pay

## 2013-06-21 LAB — BLOOD GAS, ARTERIAL
Acid-base deficit: 1.1 mmol/L (ref 0.0–2.0)
Bicarbonate: 24.7 mEq/L — ABNORMAL HIGH (ref 20.0–24.0)
O2 Content: 2 L/min
O2 SAT: 96.2 %
PATIENT TEMPERATURE: 98.6
TCO2: 26.3 mmol/L (ref 0–100)
pCO2 arterial: 53.7 mmHg — ABNORMAL HIGH (ref 35.0–45.0)
pH, Arterial: 7.284 — ABNORMAL LOW (ref 7.350–7.450)
pO2, Arterial: 85.1 mmHg (ref 80.0–100.0)

## 2013-06-21 LAB — PROTIME-INR
INR: 1.79 — ABNORMAL HIGH (ref 0.00–1.49)
Prothrombin Time: 20.3 seconds — ABNORMAL HIGH (ref 11.6–15.2)

## 2013-06-21 MED ORDER — IOHEXOL 300 MG/ML  SOLN: 25.0000 mL | INTRAMUSCULAR | Status: AC

## 2013-06-22 LAB — CBC WITH DIFFERENTIAL/PLATELET
BASOS ABS: 0.1 10*3/uL (ref 0.0–0.1)
BASOS ABS: 0.1 10*3/uL (ref 0.0–0.1)
BASOS PCT: 1 % (ref 0–1)
Basophils Relative: 1 % (ref 0–1)
EOS PCT: 1 % (ref 0–5)
Eosinophils Absolute: 0.1 10*3/uL (ref 0.0–0.7)
Eosinophils Absolute: 0.1 10*3/uL (ref 0.0–0.7)
Eosinophils Relative: 1 % (ref 0–5)
HCT: 29.6 % — ABNORMAL LOW (ref 36.0–46.0)
HEMATOCRIT: 29.6 % — AB (ref 36.0–46.0)
HEMOGLOBIN: 9.5 g/dL — AB (ref 12.0–15.0)
Hemoglobin: 9.6 g/dL — ABNORMAL LOW (ref 12.0–15.0)
LYMPHS ABS: 2 10*3/uL (ref 0.7–4.0)
LYMPHS ABS: 1.9 10*3/uL (ref 0.7–4.0)
LYMPHS PCT: 15 % (ref 12–46)
Lymphocytes Relative: 15 % (ref 12–46)
MCH: 27.9 pg (ref 26.0–34.0)
MCH: 28.6 pg (ref 26.0–34.0)
MCHC: 32.1 g/dL (ref 30.0–36.0)
MCHC: 32.4 g/dL (ref 30.0–36.0)
MCV: 87.1 fL (ref 78.0–100.0)
MCV: 88.1 fL (ref 78.0–100.0)
MONOS PCT: 6 % (ref 3–12)
Monocytes Absolute: 0.9 10*3/uL (ref 0.1–1.0)
Monocytes Absolute: 0.8 10*3/uL (ref 0.1–1.0)
Monocytes Relative: 7 % (ref 3–12)
NEUTROS ABS: 9.9 10*3/uL — AB (ref 1.7–7.7)
NEUTROS PCT: 77 % (ref 43–77)
Neutro Abs: 10 10*3/uL — ABNORMAL HIGH (ref 1.7–7.7)
Neutrophils Relative %: 76 % (ref 43–77)
Platelets: 275 10*3/uL (ref 150–400)
Platelets: 269 10*3/uL (ref 150–400)
RBC: 3.4 MIL/uL — ABNORMAL LOW (ref 3.87–5.11)
RBC: 3.36 MIL/uL — AB (ref 3.87–5.11)
RDW: 16.3 % — ABNORMAL HIGH (ref 11.5–15.5)
RDW: 16.4 % — AB (ref 11.5–15.5)
WBC: 13 10*3/uL — ABNORMAL HIGH (ref 4.0–10.5)
WBC: 12.9 10*3/uL — AB (ref 4.0–10.5)

## 2013-06-22 LAB — COMPREHENSIVE METABOLIC PANEL
ALT: 14 U/L (ref 0–35)
AST: 17 U/L (ref 0–37)
Albumin: 1.9 g/dL — ABNORMAL LOW (ref 3.5–5.2)
Alkaline Phosphatase: 99 U/L (ref 39–117)
BUN: 29 mg/dL — ABNORMAL HIGH (ref 6–23)
CALCIUM: 8.5 mg/dL (ref 8.4–10.5)
CO2: 27 mEq/L (ref 19–32)
CREATININE: 1.83 mg/dL — AB (ref 0.50–1.10)
Chloride: 103 mEq/L (ref 96–112)
GFR calc Af Amer: 29 mL/min — ABNORMAL LOW (ref >90)
GFR calc non Af Amer: 25 mL/min — ABNORMAL LOW (ref >90)
Glucose, Bld: 59 mg/dL — ABNORMAL LOW (ref 70–99)
Potassium: 4.3 mEq/L (ref 3.7–5.3)
Sodium: 143 mEq/L (ref 137–147)
TOTAL PROTEIN: 5.6 g/dL — AB (ref 6.0–8.3)
Total Bilirubin: 0.7 mg/dL (ref 0.3–1.2)

## 2013-06-22 LAB — APTT: aPTT: 47 seconds — ABNORMAL HIGH (ref 24–37)

## 2013-06-22 LAB — BLOOD GAS, ARTERIAL
Acid-Base Excess: 2.7 mmol/L — ABNORMAL HIGH (ref 0.0–2.0)
Bicarbonate: 27.2 mEq/L — ABNORMAL HIGH (ref 20.0–24.0)
O2 Content: 2 L/min
O2 Saturation: 95 %
PATIENT TEMPERATURE: 98.6
PH ART: 7.393 (ref 7.350–7.450)
PO2 ART: 71.7 mmHg — AB (ref 80.0–100.0)
TCO2: 28.6 mmol/L (ref 0–100)
pCO2 arterial: 45.6 mmHg — ABNORMAL HIGH (ref 35.0–45.0)

## 2013-06-22 LAB — PROTIME-INR
INR: 2 — ABNORMAL HIGH (ref 0.00–1.49)
Prothrombin Time: 22.1 seconds — ABNORMAL HIGH (ref 11.6–15.2)

## 2013-06-22 LAB — BASIC METABOLIC PANEL
BUN: 30 mg/dL — AB (ref 6–23)
CHLORIDE: 103 meq/L (ref 96–112)
CO2: 27 mEq/L (ref 19–32)
CREATININE: 1.85 mg/dL — AB (ref 0.50–1.10)
Calcium: 8.6 mg/dL (ref 8.4–10.5)
GFR, EST AFRICAN AMERICAN: 28 mL/min — AB (ref >90)
GFR, EST NON AFRICAN AMERICAN: 24 mL/min — AB (ref >90)
GLUCOSE: 68 mg/dL — AB (ref 70–99)
Potassium: 4.3 mEq/L (ref 3.7–5.3)
Sodium: 142 mEq/L (ref 137–147)

## 2013-06-22 LAB — PREALBUMIN: PREALBUMIN: 13.1 mg/dL — AB (ref 17.0–34.0)

## 2013-06-22 LAB — VANCOMYCIN, TROUGH: VANCOMYCIN TR: 10 ug/mL (ref 10.0–20.0)

## 2013-06-22 LAB — VITAMIN B12: Vitamin B-12: 763 pg/mL (ref 211–911)

## 2013-06-22 LAB — PHOSPHORUS: Phosphorus: 3 mg/dL (ref 2.3–4.6)

## 2013-06-22 LAB — T4, FREE: FREE T4: 1.24 ng/dL (ref 0.80–1.80)

## 2013-06-22 LAB — URIC ACID: URIC ACID, SERUM: 7 mg/dL (ref 2.4–7.0)

## 2013-06-22 LAB — PROCALCITONIN: Procalcitonin: 0.3 ng/mL

## 2013-06-22 LAB — FERRITIN: Ferritin: 382 ng/mL — ABNORMAL HIGH (ref 10–291)

## 2013-06-22 LAB — PRO B NATRIURETIC PEPTIDE: PRO B NATRI PEPTIDE: 5835 pg/mL — AB (ref 0–450)

## 2013-06-22 LAB — HEMOGLOBIN A1C
HEMOGLOBIN A1C: 7 % — AB (ref <5.7)
MEAN PLASMA GLUCOSE: 154 mg/dL — AB (ref <117)

## 2013-06-22 LAB — TSH: TSH: 1.936 u[IU]/mL (ref 0.350–4.500)

## 2013-06-22 LAB — MAGNESIUM: Magnesium: 1.6 mg/dL (ref 1.5–2.5)

## 2013-06-22 NOTE — H&P (Signed)
[name] is an 80 y.o. female.   Chief Complaint: diverticular abscess Pt has had abscess drain placed by IR 2/27 Catheter was exchanged 06/11/13 Fistula check on 3/10 revealed + fistula but smaller 3/13 CT performed because catheter had pulled partially out Report states cath is not in position any more Reviewed films with Dr Laurence Ferrari Pt now scheduled for new drain placement 3/16 Has been recently started on coumadin INR today 2.0  Drain cath is still intact; output none per RN, but bag does have approx 50 cc in it--brown fluid. Dr Laurence Ferrari has asked to leave drain cath intact---may be able to inject and find path for new drain.  HPI: DM; obese; COPD; CKD; diverticulum disease; A  Fib; PVD   Past Medical History  Diagnosis Date  . Diabetes mellitus   . Obesity   . COPD (chronic obstructive pulmonary disease)     Moderate. PFTs (12/10): FVC 67%, FEV1 58%, ratio 60%, TLC 95%, RV 138%, DLCO 43% (moderate obstructive defect). She is on home oxygen that should be worn at all times.  . Diastolic CHF, chronic     Echo (3/12): EF 55-60%, mild LV hypertrophy, grade I diastolic dysfunction, mild left atrial enlargement, normal pulmonary artery pressure  . CKD (chronic kidney disease)     Cr 2.4 when last checked. ANCA +, pauci-immune glomerulnephritis followed by Dr. Marval Regal  . PNA (pneumonia)     h/o with ARDS in 2005; MRSA PNA with prolonged hospitalization in [facility] 11/11  . Diverticulum     Jejunal  . OSA (obstructive sleep apnea)     possible. When pt was sedated for TEE in May 2010, she did develop some upper airway obstruction  . Anemia     of chronic disease  . Hypothyroidism   . Gout   . Atrial fibrillation 07/2008    Failed cardioversion with TEE guidance in May 2010. Pt went back to NSR by 11/2008 with amiodarne  . Hypertension   . GERD (gastroesophageal reflux disease)   . Peripheral vascular disease   . Pneumonia   . Chronic kidney disease   .  Respiratory failure, chronic 07/01/2011  . Chronic gout due to renal impairment 07/01/2011    Past Surgical History  Procedure Laterality Date  . Tracheostomy      ARDS/ ICU admission, trach 2005  . Microperforation  08/2008    Of jejunem, ICU admission    Family History  Problem Relation Age of Onset  . Emphysema Mother     and asthma  . Asthma Mother   . Heart disease Neg Hx     premature   Social History:  reports that she quit smoking about 37 years ago. Her smoking use included Cigarettes. She has a 23 pack-year smoking history. She has never used smokeless tobacco. She reports that she drinks alcohol. She reports that she does not use illicit drugs.  Allergies:  Allergies  Allergen Reactions  . Sulfonamide Derivatives Other (See Comments)    Childhood reaction    Medications Prior to Admission  Medication Sig Dispense Refill  . albuterol (PROVENTIL) (2.5 MG/3ML) 0.083% nebulizer solution Take 3 mLs (2.5 mg total) by nebulization 2 (two) times daily. Dx: 492.8 COPD with Emphysema  270 mL  5  . Amino Acids-Protein Hydrolys (FEEDING SUPPLEMENT, PRO-STAT SUGAR FREE 64,) LIQD Take 30 mLs by mouth daily.  900 mL  0  . barrier cream (NON-SPECIFIED) CREA Apply 1 application topically 3 (three) times daily as needed (place  around drain area to keep it dry and clean).      . bisoprolol (ZEBETA) 10 MG tablet Take 5 mg by mouth daily.      . feeding supplement, GLUCERNA SHAKE, (GLUCERNA SHAKE) LIQD Take 237 mLs by mouth 2 (two) times daily between meals.    0  . guaifenesin (HUMIBID E) 400 MG TABS Take 400 mg by mouth 2 (two) times daily.       . heparin 100-0.45 UNIT/ML-% infusion Inject 1,250 Units/hr into the vein continuous.  250 mL    . HYDROcodone-acetaminophen (NORCO/VICODIN) 5-325 MG per tablet Take 2 tablets by mouth every 4 (four) hours as needed for moderate pain.  30 tablet  0  . levothyroxine (SYNTHROID, LEVOTHROID) 50 MCG tablet Take 50 mcg by mouth daily.      .  Multiple Vitamin (MULITIVITAMIN WITH MINERALS) TABS Take 1 tablet by mouth daily.      . piperacillin-tazobactam (ZOSYN) 3.375 GM/50ML IVPB Inject 50 mLs (3.375 g total) into the vein every 8 (eight) hours.  50 mL    . potassium chloride SA (K-DUR,KLOR-CON) 20 MEQ tablet Take 20 mEq by mouth daily.      . predniSONE (DELTASONE) 10 MG tablet Take 10 mg by mouth daily with breakfast.      . ranitidine (ZANTAC) 150 MG capsule Take 150 mg by mouth every evening.        . tiotropium (SPIRIVA HANDIHALER) 18 MCG inhalation capsule Place 1 capsule (18 mcg total) into inhaler and inhale daily.  30 capsule  11  . torsemide (DEMADEX) 20 MG tablet Take 80 mg by mouth daily.      . traMADol (ULTRAM) 50 MG tablet Take 0.5 tablets (25 mg total) by mouth every 12 (twelve) hours as needed for moderate pain.  10 tablet  0  . Vancomycin (VANCOCIN) 750 MG/150ML SOLN Inject 150 mLs (750 mg total) into the vein every other day.      . warfarin (COUMADIN) 2 MG tablet Take 2 mg by mouth daily at 6 PM.        Results for orders placed during the hospital encounter of 06/20/13 (from the past 48 hour(s))  BLOOD GAS, ARTERIAL     Status: Abnormal   Collection Time    06/21/13  4:45 AM      Result Value Ref Range   O2 Content 2.0     Delivery systems NASAL CANNULA     pH, Arterial 7.284 (*) 7.350 - 7.450   pCO2 arterial 53.7 (*) 35.0 - 45.0 mmHg   pO2, Arterial 85.1  80.0 - 100.0 mmHg   Bicarbonate 24.7 (*) 20.0 - 24.0 mEq/L   TCO2 26.3  0 - 100 mmol/L   Acid-base deficit 1.1  0.0 - 2.0 mmol/L   O2 Saturation 96.2     Patient temperature 98.6     Collection site BRACHIAL ARTERY     Drawn by COLLECTED BY RT     Sample type ARTERIAL DRAW     Allens test (pass/fail) PASS  PASS  PROTIME-INR     Status: Abnormal   Collection Time    06/21/13  1:52 PM      Result Value Ref Range   Prothrombin Time 20.3 (*) 11.6 - 15.2 seconds   INR 1.79 (*) 0.00 - 1.49  BLOOD GAS, ARTERIAL     Status: Abnormal   Collection Time     06/22/13  5:34 AM      Result Value Ref Range  O2 Content 2.0     Delivery systems NASAL CANNULA     Mode NASAL CANNULA     pH, Arterial 7.393  7.350 - 7.450   pCO2 arterial 45.6 (*) 35.0 - 45.0 mmHg   pO2, Arterial 71.7 (*) 80.0 - 100.0 mmHg   Bicarbonate 27.2 (*) 20.0 - 24.0 mEq/L   TCO2 28.6  0 - 100 mmol/L   Acid-Base Excess 2.7 (*) 0.0 - 2.0 mmol/L   O2 Saturation 95.0     Patient temperature 98.6     Collection site LEFT RADIAL     Drawn by  PAULA PERGUSON,RRT,RCP     Sample type ARTERIAL     Allens test (pass/fail) PASS  PASS  CBC WITH DIFFERENTIAL     Status: Abnormal   Collection Time    06/22/13  6:20 AM      Result Value Ref Range   WBC 13.0 (*) 4.0 - 10.5 K/uL   RBC 3.40 (*) 3.87 - 5.11 MIL/uL   Hemoglobin 9.5 (*) 12.0 - 15.0 g/dL   HCT 29.6 (*) 36.0 - 46.0 %   MCV 87.1  78.0 - 100.0 fL   MCH 27.9  26.0 - 34.0 pg   MCHC 32.1  30.0 - 36.0 g/dL   RDW 16.3 (*) 11.5 - 15.5 %   Platelets 275  150 - 400 K/uL   Neutrophils Relative % 76  43 - 77 %   Lymphocytes Relative 15  12 - 46 %   Monocytes Relative 7  3 - 12 %   Eosinophils Relative 1  0 - 5 %   Basophils Relative 1  0 - 1 %   Neutro Abs 9.9 (*) 1.7 - 7.7 K/uL   Lymphs Abs 2.0  0.7 - 4.0 K/uL   Monocytes Absolute 0.9  0.1 - 1.0 K/uL   Eosinophils Absolute 0.1  0.0 - 0.7 K/uL   Basophils Absolute 0.1  0.0 - 0.1 K/uL   WBC Morphology ATYPICAL LYMPHOCYTES    BASIC METABOLIC PANEL     Status: Abnormal   Collection Time    06/22/13  6:20 AM      Result Value Ref Range   Sodium 142  137 - 147 mEq/L   Potassium 4.3  3.7 - 5.3 mEq/L   Chloride 103  96 - 112 mEq/L   CO2 27  19 - 32 mEq/L   Glucose, Bld 68 (*) 70 - 99 mg/dL   BUN 30 (*) 6 - 23 mg/dL   Creatinine, Ser 1.85 (*) 0.50 - 1.10 mg/dL   Calcium 8.6  8.4 - 10.5 mg/dL   GFR calc non Af Amer 24 (*) >90 mL/min   GFR calc Af Amer 28 (*) >90 mL/min   Comment: (NOTE)     The eGFR has been calculated using the CKD EPI equation.     This calculation has  not been validated in all clinical situations.     eGFR's persistently <90 mL/min signify possible Chronic Kidney     Disease.  CBC WITH DIFFERENTIAL     Status: Abnormal   Collection Time    06/22/13  9:00 AM      Result Value Ref Range   WBC 12.9 (*) 4.0 - 10.5 K/uL   RBC 3.36 (*) 3.87 - 5.11 MIL/uL   Hemoglobin 9.6 (*) 12.0 - 15.0 g/dL   HCT 29.6 (*) 36.0 - 46.0 %   MCV 88.1  78.0 - 100.0 fL   MCH 28.6  26.0 -  34.0 pg   MCHC 32.4  30.0 - 36.0 g/dL   RDW 16.4 (*) 11.5 - 15.5 %   Platelets 269  150 - 400 K/uL   Neutrophils Relative % 77  43 - 77 %   Lymphocytes Relative 15  12 - 46 %   Monocytes Relative 6  3 - 12 %   Eosinophils Relative 1  0 - 5 %   Basophils Relative 1  0 - 1 %   Neutro Abs 10.0 (*) 1.7 - 7.7 K/uL   Lymphs Abs 1.9  0.7 - 4.0 K/uL   Monocytes Absolute 0.8  0.1 - 1.0 K/uL   Eosinophils Absolute 0.1  0.0 - 0.7 K/uL   Basophils Absolute 0.1  0.0 - 0.1 K/uL   WBC Morphology ATYPICAL LYMPHOCYTES    COMPREHENSIVE METABOLIC PANEL     Status: Abnormal   Collection Time    06/22/13  9:00 AM      Result Value Ref Range   Sodium 143  137 - 147 mEq/L   Potassium 4.3  3.7 - 5.3 mEq/L   Chloride 103  96 - 112 mEq/L   CO2 27  19 - 32 mEq/L   Glucose, Bld 59 (*) 70 - 99 mg/dL   BUN 29 (*) 6 - 23 mg/dL   Creatinine, Ser 1.83 (*) 0.50 - 1.10 mg/dL   Calcium 8.5  8.4 - 10.5 mg/dL   Total Protein 5.6 (*) 6.0 - 8.3 g/dL   Albumin 1.9 (*) 3.5 - 5.2 g/dL   AST 17  0 - 37 U/L   ALT 14  0 - 35 U/L   Alkaline Phosphatase 99  39 - 117 U/L   Total Bilirubin 0.7  0.3 - 1.2 mg/dL   GFR calc non Af Amer 25 (*) >90 mL/min   GFR calc Af Amer 29 (*) >90 mL/min   Comment: (NOTE)     The eGFR has been calculated using the CKD EPI equation.     This calculation has not been validated in all clinical situations.     eGFR's persistently <90 mL/min signify possible Chronic Kidney     Disease.  MAGNESIUM     Status: None   Collection Time    06/22/13  9:00 AM      Result Value Ref  Range   Magnesium 1.6  1.5 - 2.5 mg/dL  PRO B NATRIURETIC PEPTIDE     Status: Abnormal   Collection Time    06/22/13  9:00 AM      Result Value Ref Range   Pro B Natriuretic peptide (BNP) 5835.0 (*) 0 - 450 pg/mL  PHOSPHORUS     Status: None   Collection Time    06/22/13  9:00 AM      Result Value Ref Range   Phosphorus 3.0  2.3 - 4.6 mg/dL  PROTIME-INR     Status: Abnormal   Collection Time    06/22/13  9:00 AM      Result Value Ref Range   Prothrombin Time 22.1 (*) 11.6 - 15.2 seconds   INR 2.00 (*) 0.00 - 1.49  APTT     Status: Abnormal   Collection Time    06/22/13  9:00 AM      Result Value Ref Range   aPTT 47 (*) 24 - 37 seconds   Comment:            IF BASELINE aPTT IS ELEVATED,     SUGGEST PATIENT RISK ASSESSMENT  BE USED TO DETERMINE APPROPRIATE     ANTICOAGULANT THERAPY.  URIC ACID     Status: None   Collection Time    06/22/13  9:00 AM      Result Value Ref Range   Uric Acid, Serum 7.0  2.4 - 7.0 mg/dL  VANCOMYCIN, TROUGH     Status: None   Collection Time    06/22/13  9:47 AM      Result Value Ref Range   Vancomycin Tr 10.0  10.0 - 20.0 ug/mL   Ct Abdomen Pelvis Wo Contrast  06/21/2013   CLINICAL DATA:  Chronic diverticular abscess. Abscess drainage tube is pulled out.  EXAM: CT ABDOMEN AND PELVIS WITHOUT CONTRAST  TECHNIQUE: Multidetector CT imaging of the abdomen and pelvis was performed following the standard protocol without intravenous contrast.  COMPARISON:  CT abdomen 06/14/2013  FINDINGS: The pigtail drainage catheter previously was in the left pelvic abscess. This catheter has pulled back into the soft tissues and can be removed at this time. Soft tissue thickening at the site of the previous abscess measures 29 x 39 cm. This is lateral to the sigmoid colon and could represent phlegmon or recurrent abscess. Intravenous contrast was not given which would be helpful to evaluate for abscess. Mild thickening and edema in the left iliacus muscle again noted.  Small gas bubble also present on the left iliacus muscle.  Sigmoid diverticular change is noted without evidence of acute diverticulitis. Negative for bowel obstruction.  Bibasilar atelectasis and small pleural effusions  Hypertrophy of the left lobe liver suggesting chronic liver disease/cirrhosis. Gallstones. No biliary ductal dilatation. Pancreas and spleen are intact. The kidneys show no obstruction or mass. No ascites.  IMPRESSION: Pigtail drainage catheter in the left pelvic abscess has pulled back into the soft tissues lateral to the left hip. This can be removed at this time.  29 x 39 mm soft tissue density lateral to the sigmoid colon at the location of previously noted abscess. Mild thickening of the left iliacus muscle, similar to the prior study. This may be related to infection or edema. Small gas bubble in the left iliac muscle has improved. Close followup of this area suggested to evaluate for recurrent abscess however this may be phlegmon. There is sigmoid diverticulosis without evidence of acute diverticulitis.  Gallstones and probable cirrhosis.  Bibasilar atelectasis and small pleural effusions.   Electronically Signed   By: Franchot Gallo M.D.   On: 06/21/2013 23:43   Dg Chest Port 1 View  06/21/2013   CLINICAL DATA:  Right PICC placement  EXAM: PORTABLE CHEST - 1 VIEW  COMPARISON:  Prior chest x-ray 06/21/2013  FINDINGS: The tip of the right upper extremity PICC projects over the mid SVC. Otherwise, the appearance of the chest is unchanged. There is pulmonary vascular congestion bordering on mild edema, cardiomegaly and left larger than right pleural effusions with associated basilar atelectasis. No new finding or significant acute change.  IMPRESSION: The tip of the right upper extremity PICC projects over the mid SVC.  Otherwise, unchanged appearance of the chest.   Electronically Signed   By: Jacqulynn Cadet M.D.   On: 06/21/2013 15:30   Dg Chest Port 1 View  06/21/2013   CLINICAL  DATA:  Respiratory distress  EXAM: PORTABLE CHEST - 1 VIEW  COMPARISON:  06/06/2013  FINDINGS: Cardiomegaly with mild to moderate interstitial edema. Small bilateral pleural effusions, left greater than right.  No pneumothorax.  IMPRESSION: Cardiomegaly with mild to moderate interstitial edema.  Suspected small bilateral pleural effusions, left greater than right.   Electronically Signed   By: Julian Hy M.D.   On: 06/21/2013 08:10    Review of Systems  Constitutional: Negative for fever.  Respiratory: Negative for shortness of breath.   Gastrointestinal: Positive for abdominal pain. Negative for nausea and vomiting.  Neurological: Positive for weakness.    There were no vitals taken for this visit. Physical Exam  Constitutional: She is oriented to person, place, and time. She appears well-developed.  Cardiovascular: Normal rate and regular rhythm.   No murmur heard. Respiratory: Effort normal and breath sounds normal. She has no wheezes.  GI: Soft. Bowel sounds are normal. There is tenderness.  Existing L abd abscess drain intact Pulled mostly out; secured to skin Output in bag is brown  Musculoskeletal: Normal range of motion.  Neurological: She is alert and oriented to person, place, and time.  Skin: Skin is warm and dry.  Psychiatric: She has a normal mood and affect. Her behavior is normal. Judgment and thought content normal.     Assessment/Plan Diverticular abscess Drain in since 2/27 + fistula 3/10 but smaller Catheter has been pulled---not completely out per CT 3/13 Request for new drain placement/ replacement Pt aware of procedure benefits and risks and agreeable to proceed Consent signed andin chart Lovenox held for 3/16 Coumadin held now Recheck coags 3/16  Avis A 06/22/2013, 11:45 AM

## 2013-06-23 ENCOUNTER — Other Ambulatory Visit (HOSPITAL_COMMUNITY): Payer: Self-pay

## 2013-06-23 LAB — BASIC METABOLIC PANEL
BUN: 27 mg/dL — ABNORMAL HIGH (ref 6–23)
CO2: 28 mEq/L (ref 19–32)
Calcium: 7.7 mg/dL — ABNORMAL LOW (ref 8.4–10.5)
Chloride: 103 mEq/L (ref 96–112)
Creatinine, Ser: 1.72 mg/dL — ABNORMAL HIGH (ref 0.50–1.10)
GFR calc non Af Amer: 27 mL/min — ABNORMAL LOW (ref >90)
GFR, EST AFRICAN AMERICAN: 31 mL/min — AB (ref >90)
GLUCOSE: 66 mg/dL — AB (ref 70–99)
POTASSIUM: 3.7 meq/L (ref 3.7–5.3)
Sodium: 148 mEq/L — ABNORMAL HIGH (ref 137–147)

## 2013-06-23 LAB — CBC
HCT: 25.7 % — ABNORMAL LOW (ref 36.0–46.0)
HEMOGLOBIN: 8.5 g/dL — AB (ref 12.0–15.0)
MCH: 28.6 pg (ref 26.0–34.0)
MCHC: 33.1 g/dL (ref 30.0–36.0)
MCV: 86.5 fL (ref 78.0–100.0)
PLATELETS: 275 10*3/uL (ref 150–400)
RBC: 2.97 MIL/uL — ABNORMAL LOW (ref 3.87–5.11)
RDW: 16.7 % — ABNORMAL HIGH (ref 11.5–15.5)
WBC: 9.9 10*3/uL (ref 4.0–10.5)

## 2013-06-23 LAB — PROTIME-INR
INR: 2.03 — AB (ref 0.00–1.49)
PROTHROMBIN TIME: 22.3 s — AB (ref 11.6–15.2)

## 2013-06-23 LAB — APTT: aPTT: 50 seconds — ABNORMAL HIGH (ref 24–37)

## 2013-06-23 LAB — FOLATE RBC: RBC FOLATE: 1646 ng/mL — AB (ref >280)

## 2013-06-24 ENCOUNTER — Other Ambulatory Visit (HOSPITAL_COMMUNITY): Payer: Self-pay

## 2013-06-24 LAB — CBC WITH DIFFERENTIAL/PLATELET
BASOS ABS: 0.1 10*3/uL (ref 0.0–0.1)
BASOS PCT: 1 % (ref 0–1)
Eosinophils Absolute: 0.1 10*3/uL (ref 0.0–0.7)
Eosinophils Relative: 1 % (ref 0–5)
HEMATOCRIT: 22.4 % — AB (ref 36.0–46.0)
HEMOGLOBIN: 7.2 g/dL — AB (ref 12.0–15.0)
LYMPHS ABS: 1.7 10*3/uL (ref 0.7–4.0)
LYMPHS PCT: 13 % (ref 12–46)
MCH: 27.7 pg (ref 26.0–34.0)
MCHC: 32.1 g/dL (ref 30.0–36.0)
MCV: 86.2 fL (ref 78.0–100.0)
MONOS PCT: 8 % (ref 3–12)
Monocytes Absolute: 1.1 10*3/uL — ABNORMAL HIGH (ref 0.1–1.0)
NEUTROS ABS: 10.2 10*3/uL — AB (ref 1.7–7.7)
Neutrophils Relative %: 77 % (ref 43–77)
Platelets: 312 10*3/uL (ref 150–400)
RBC: 2.6 MIL/uL — ABNORMAL LOW (ref 3.87–5.11)
RDW: 16.9 % — AB (ref 11.5–15.5)
WBC: 13.2 10*3/uL — ABNORMAL HIGH (ref 4.0–10.5)

## 2013-06-24 LAB — PROTIME-INR
INR: 1.58 — AB (ref 0.00–1.49)
Prothrombin Time: 18.4 seconds — ABNORMAL HIGH (ref 11.6–15.2)

## 2013-06-24 LAB — COMPREHENSIVE METABOLIC PANEL
ALK PHOS: 69 U/L (ref 39–117)
ALT: 10 U/L (ref 0–35)
AST: 11 U/L (ref 0–37)
Albumin: 1.7 g/dL — ABNORMAL LOW (ref 3.5–5.2)
BUN: 33 mg/dL — ABNORMAL HIGH (ref 6–23)
CO2: 29 mEq/L (ref 19–32)
Calcium: 7.8 mg/dL — ABNORMAL LOW (ref 8.4–10.5)
Chloride: 102 mEq/L (ref 96–112)
Creatinine, Ser: 2.19 mg/dL — ABNORMAL HIGH (ref 0.50–1.10)
GFR calc non Af Amer: 20 mL/min — ABNORMAL LOW (ref >90)
GFR, EST AFRICAN AMERICAN: 23 mL/min — AB (ref >90)
GLUCOSE: 94 mg/dL (ref 70–99)
POTASSIUM: 4 meq/L (ref 3.7–5.3)
Sodium: 148 mEq/L — ABNORMAL HIGH (ref 137–147)
TOTAL PROTEIN: 5.2 g/dL — AB (ref 6.0–8.3)
Total Bilirubin: 0.6 mg/dL (ref 0.3–1.2)

## 2013-06-24 LAB — MAGNESIUM: Magnesium: 1.4 mg/dL — ABNORMAL LOW (ref 1.5–2.5)

## 2013-06-24 LAB — PHOSPHORUS: Phosphorus: 3 mg/dL (ref 2.3–4.6)

## 2013-06-24 LAB — PREPARE RBC (CROSSMATCH)

## 2013-06-24 MED ORDER — IOHEXOL 300 MG/ML  SOLN
50.0000 mL | Freq: Once | INTRAMUSCULAR | Status: AC | PRN
  Administered 2013-06-24: 1 mL

## 2013-06-24 MED ORDER — MIDAZOLAM HCL 2 MG/2ML IJ SOLN
INTRAMUSCULAR | Status: AC | PRN
  Administered 2013-06-24: 1 mg via INTRAVENOUS
  Administered 2013-06-24: 2 mg via INTRAVENOUS
  Administered 2013-06-24 (×4): 1 mg via INTRAVENOUS
  Administered 2013-06-24: 0.5 mg via INTRAVENOUS

## 2013-06-24 MED ORDER — FENTANYL CITRATE 0.05 MG/ML IJ SOLN
INTRAMUSCULAR | Status: AC | PRN
  Administered 2013-06-24 (×3): 50 ug via INTRAVENOUS
  Administered 2013-06-24: 25 ug via INTRAVENOUS
  Administered 2013-06-24: 100 ug via INTRAVENOUS
  Administered 2013-06-24 (×2): 50 ug via INTRAVENOUS

## 2013-06-24 MED ORDER — MIDAZOLAM HCL 2 MG/2ML IJ SOLN
INTRAMUSCULAR | Status: AC
  Filled 2013-06-24: qty 4

## 2013-06-24 MED ORDER — FENTANYL CITRATE 0.05 MG/ML IJ SOLN
INTRAMUSCULAR | Status: AC
  Filled 2013-06-24: qty 6

## 2013-06-24 MED ORDER — FENTANYL CITRATE 0.05 MG/ML IJ SOLN
INTRAMUSCULAR | Status: AC
  Filled 2013-06-24: qty 4

## 2013-06-24 MED ORDER — MIDAZOLAM HCL 2 MG/2ML IJ SOLN
INTRAMUSCULAR | Status: AC
  Filled 2013-06-24: qty 6

## 2013-06-24 NOTE — Sedation Documentation (Addendum)
Following review of CT scan, consent obtained from pt's daughter to remove current drain,  reinsert with CT guidance with moderate sedation

## 2013-06-24 NOTE — Sedation Documentation (Signed)
Vancomycin 1 gram started IV

## 2013-06-24 NOTE — Procedures (Signed)
Technically successful CT guided drainage catheter placement into right hemi pelvis yielding 20 cc of feculent material.  No immediate post procedural complications.

## 2013-06-24 NOTE — Sedation Documentation (Signed)
Pt transferred to CT for post drain placement CT pelvis drain without contrast

## 2013-06-25 LAB — CBC WITH DIFFERENTIAL/PLATELET
BASOS ABS: 0.2 10*3/uL — AB (ref 0.0–0.1)
Basophils Relative: 1 % (ref 0–1)
Eosinophils Absolute: 0.2 10*3/uL (ref 0.0–0.7)
Eosinophils Relative: 1 % (ref 0–5)
HEMATOCRIT: 21.7 % — AB (ref 36.0–46.0)
Hemoglobin: 7.3 g/dL — ABNORMAL LOW (ref 12.0–15.0)
LYMPHS PCT: 10 % — AB (ref 12–46)
Lymphs Abs: 1.9 10*3/uL (ref 0.7–4.0)
MCH: 28.5 pg (ref 26.0–34.0)
MCHC: 33.6 g/dL (ref 30.0–36.0)
MCV: 84.8 fL (ref 78.0–100.0)
MONOS PCT: 5 % (ref 3–12)
Monocytes Absolute: 0.9 10*3/uL (ref 0.1–1.0)
NEUTROS ABS: 15.5 10*3/uL — AB (ref 1.7–7.7)
Neutrophils Relative %: 83 % — ABNORMAL HIGH (ref 43–77)
PLATELETS: 342 10*3/uL (ref 150–400)
RBC: 2.56 MIL/uL — ABNORMAL LOW (ref 3.87–5.11)
RDW: 17.2 % — AB (ref 11.5–15.5)
WBC: 18.7 10*3/uL — AB (ref 4.0–10.5)

## 2013-06-25 LAB — BASIC METABOLIC PANEL
BUN: 39 mg/dL — ABNORMAL HIGH (ref 6–23)
CHLORIDE: 104 meq/L (ref 96–112)
CO2: 30 mEq/L (ref 19–32)
Calcium: 8.2 mg/dL — ABNORMAL LOW (ref 8.4–10.5)
Creatinine, Ser: 2.1 mg/dL — ABNORMAL HIGH (ref 0.50–1.10)
GFR calc Af Amer: 24 mL/min — ABNORMAL LOW (ref >90)
GFR, EST NON AFRICAN AMERICAN: 21 mL/min — AB (ref >90)
Glucose, Bld: 81 mg/dL (ref 70–99)
Potassium: 4.1 mEq/L (ref 3.7–5.3)
SODIUM: 147 meq/L (ref 137–147)

## 2013-06-25 LAB — MAGNESIUM: Magnesium: 1.9 mg/dL (ref 1.5–2.5)

## 2013-06-25 LAB — PHOSPHORUS: PHOSPHORUS: 3.2 mg/dL (ref 2.3–4.6)

## 2013-06-25 LAB — PROTIME-INR
INR: 1.37 (ref 0.00–1.49)
Prothrombin Time: 16.5 seconds — ABNORMAL HIGH (ref 11.6–15.2)

## 2013-06-26 LAB — CBC
HCT: 29 % — ABNORMAL LOW (ref 36.0–46.0)
Hemoglobin: 9.9 g/dL — ABNORMAL LOW (ref 12.0–15.0)
MCH: 29.2 pg (ref 26.0–34.0)
MCHC: 34.1 g/dL (ref 30.0–36.0)
MCV: 85.5 fL (ref 78.0–100.0)
Platelets: 321 10*3/uL (ref 150–400)
RBC: 3.39 MIL/uL — ABNORMAL LOW (ref 3.87–5.11)
RDW: 17.6 % — ABNORMAL HIGH (ref 11.5–15.5)
WBC: 20.1 10*3/uL — ABNORMAL HIGH (ref 4.0–10.5)

## 2013-06-26 LAB — COMPREHENSIVE METABOLIC PANEL
ALK PHOS: 94 U/L (ref 39–117)
ALT: 13 U/L (ref 0–35)
AST: 14 U/L (ref 0–37)
Albumin: 1.9 g/dL — ABNORMAL LOW (ref 3.5–5.2)
BILIRUBIN TOTAL: 0.7 mg/dL (ref 0.3–1.2)
BUN: 43 mg/dL — AB (ref 6–23)
CHLORIDE: 98 meq/L (ref 96–112)
CO2: 30 meq/L (ref 19–32)
Calcium: 8.7 mg/dL (ref 8.4–10.5)
Creatinine, Ser: 1.64 mg/dL — ABNORMAL HIGH (ref 0.50–1.10)
GFR calc Af Amer: 33 mL/min — ABNORMAL LOW (ref >90)
GFR, EST NON AFRICAN AMERICAN: 28 mL/min — AB (ref >90)
Glucose, Bld: 168 mg/dL — ABNORMAL HIGH (ref 70–99)
POTASSIUM: 3.7 meq/L (ref 3.7–5.3)
Sodium: 139 mEq/L (ref 137–147)
Total Protein: 5.8 g/dL — ABNORMAL LOW (ref 6.0–8.3)

## 2013-06-26 LAB — DIFFERENTIAL
Basophils Absolute: 0.3 10*3/uL — ABNORMAL HIGH (ref 0.0–0.1)
Basophils Relative: 1 % (ref 0–1)
Eosinophils Absolute: 0.2 10*3/uL (ref 0.0–0.7)
Eosinophils Relative: 1 % (ref 0–5)
LYMPHS PCT: 10 % — AB (ref 12–46)
Lymphs Abs: 2.1 10*3/uL (ref 0.7–4.0)
MONOS PCT: 6 % (ref 3–12)
Monocytes Absolute: 1.2 10*3/uL — ABNORMAL HIGH (ref 0.1–1.0)
NEUTROS ABS: 16.5 10*3/uL — AB (ref 1.7–7.7)
Neutrophils Relative %: 82 % — ABNORMAL HIGH (ref 43–77)

## 2013-06-26 LAB — TRIGLYCERIDES: Triglycerides: 274 mg/dL — ABNORMAL HIGH (ref <150)

## 2013-06-26 LAB — PHOSPHORUS: Phosphorus: 2.9 mg/dL (ref 2.3–4.6)

## 2013-06-26 LAB — PROTIME-INR
INR: 1.86 — ABNORMAL HIGH (ref 0.00–1.49)
Prothrombin Time: 20.9 seconds — ABNORMAL HIGH (ref 11.6–15.2)

## 2013-06-26 LAB — MAGNESIUM: Magnesium: 1.7 mg/dL (ref 1.5–2.5)

## 2013-06-27 ENCOUNTER — Other Ambulatory Visit (HOSPITAL_COMMUNITY): Payer: Medicare Other

## 2013-06-27 ENCOUNTER — Other Ambulatory Visit (HOSPITAL_COMMUNITY): Payer: Self-pay

## 2013-06-27 LAB — BASIC METABOLIC PANEL
BUN: 46 mg/dL — ABNORMAL HIGH (ref 6–23)
CHLORIDE: 97 meq/L (ref 96–112)
CO2: 29 meq/L (ref 19–32)
Calcium: 8.3 mg/dL — ABNORMAL LOW (ref 8.4–10.5)
Creatinine, Ser: 1.85 mg/dL — ABNORMAL HIGH (ref 0.50–1.10)
GFR calc Af Amer: 28 mL/min — ABNORMAL LOW (ref >90)
GFR calc non Af Amer: 24 mL/min — ABNORMAL LOW (ref >90)
Glucose, Bld: 210 mg/dL — ABNORMAL HIGH (ref 70–99)
POTASSIUM: 3.4 meq/L — AB (ref 3.7–5.3)
SODIUM: 137 meq/L (ref 137–147)

## 2013-06-27 LAB — CBC WITH DIFFERENTIAL/PLATELET
Basophils Absolute: 0.2 10*3/uL — ABNORMAL HIGH (ref 0.0–0.1)
Basophils Relative: 1 % (ref 0–1)
EOS PCT: 0 % (ref 0–5)
Eosinophils Absolute: 0 10*3/uL (ref 0.0–0.7)
HCT: 25.5 % — ABNORMAL LOW (ref 36.0–46.0)
HEMOGLOBIN: 8.5 g/dL — AB (ref 12.0–15.0)
LYMPHS PCT: 12 % (ref 12–46)
Lymphs Abs: 2 10*3/uL (ref 0.7–4.0)
MCH: 29.3 pg (ref 26.0–34.0)
MCHC: 33.3 g/dL (ref 30.0–36.0)
MCV: 87.9 fL (ref 78.0–100.0)
MONOS PCT: 8 % (ref 3–12)
Monocytes Absolute: 1.3 10*3/uL — ABNORMAL HIGH (ref 0.1–1.0)
NEUTROS ABS: 12.8 10*3/uL — AB (ref 1.7–7.7)
Neutrophils Relative %: 79 % — ABNORMAL HIGH (ref 43–77)
Platelets: 289 10*3/uL (ref 150–400)
RBC: 2.9 MIL/uL — ABNORMAL LOW (ref 3.87–5.11)
RDW: 19.1 % — ABNORMAL HIGH (ref 11.5–15.5)
WBC: 16.3 10*3/uL — AB (ref 4.0–10.5)

## 2013-06-27 LAB — TYPE AND SCREEN
ABO/RH(D): O POS
ANTIBODY SCREEN: NEGATIVE
Unit division: 0
Unit division: 0

## 2013-06-27 LAB — PROTIME-INR
INR: 2.74 — AB (ref 0.00–1.49)
Prothrombin Time: 28.1 seconds — ABNORMAL HIGH (ref 11.6–15.2)

## 2013-06-27 LAB — PHOSPHORUS: Phosphorus: 3.2 mg/dL (ref 2.3–4.6)

## 2013-06-27 LAB — CLOSTRIDIUM DIFFICILE BY PCR: Toxigenic C. Difficile by PCR: NEGATIVE

## 2013-06-27 LAB — PREALBUMIN: Prealbumin: 13.2 mg/dL — ABNORMAL LOW (ref 17.0–34.0)

## 2013-06-27 LAB — OCCULT BLOOD X 1 CARD TO LAB, STOOL: FECAL OCCULT BLD: POSITIVE — AB

## 2013-06-27 NOTE — Progress Notes (Signed)
  Subjective: Diverticular drain upsized 3/16 (L) Pt feels better Site very tender per pt  Objective: Vital signs in last 24 hours:      Intake/Output from previous day:   Intake/Output this shift:    PE:  afeb Wbc trending down Hgb: 8.5 (9.9) Drain site with skin redness noted Cellulitis probable; skin also moist and macerated--very tender to remove tape and see drain site. Drain site with clot all around tubing 50 cc blood in bag today  When I visualized drain on 3/17- output was fecelant Note for 3/18 describes drainage as serous Today all bloody  Lab Results:   Recent Labs  06/26/13 1130 06/27/13 0555  WBC 20.1* 16.3*  HGB 9.9* 8.5*  HCT 29.0* 25.5*  PLT 321 289   BMET  Recent Labs  06/26/13 1130 06/27/13 0555  NA 139 137  K 3.7 3.4*  CL 98 97  CO2 30 29  GLUCOSE 168* 210*  BUN 43* 46*  CREATININE 1.64* 1.85*  CALCIUM 8.7 8.3*   PT/INR  Recent Labs  06/26/13 1130 06/27/13 0555  LABPROT 20.9* 28.1*  INR 1.86* 2.74*   ABG No results found for this basename: PHART, PCO2, PO2, HCO3,  in the last 72 hours  Studies/Results: Dg Chest Port 1 View  06/27/2013   CLINICAL DATA:  Respiratory failure.  EXAM: PORTABLE CHEST - 1 VIEW  COMPARISON:  DG CHEST 1V PORT dated 06/23/2013; DG CHEST 1V PORT dated 06/21/2013; CT IMAGE GUIDED FLUID DRAIN BY CATHETER dated 06/24/2013; CT PELVIS W/O CM dated 06/24/2013; CT ABD/PELV WO CM dated 06/21/2013  FINDINGS: PICC line positioning is stable in the SVC. There is a stable small to moderate-sized left pleural effusion with associated left lower lobe atelectasis. Mild atelectasis is present at the right lung base. No edema is identified. There is stable cardiomegaly.  IMPRESSION: Stable left pleural effusion with associated left lower lobe atelectasis. Mild atelectasis is present at the right lung base.   Electronically Signed   By: Irish LackGlenn  Yamagata M.D.   On: 06/27/2013 08:16    Anti-infectives: Anti-infectives   None       Assessment/Plan: s/p * No surgery found *  Diverticular abscess drain intact Bloody output and clot at tubing site Spoke to [facility] NP Brittny Plan per [facility] MD:  ? GI consult/re CT?   LOS: 7 days    Delane Stalling A 06/27/2013

## 2013-06-27 NOTE — Consult Note (Signed)
[facility] Consult: 10:48 AM 07/02/2013  LOS: 8 days    Referring Provider: Dr Wynonia Lawman Primary Care Physician:  Has none.  Followed by specialist for heart and lung.  Primary Gastroenterologist:  unassigned  Reason for Consultation:  Bleeding per abcess drain and in stool.    HPI: [name] is a 80 y.o. female. Has advanced COPD. Hx jejunal diverticulitis in 2013 requiring prolonged admission with ARDS.  Hx C diff in 2013.   Hx sigmoid diverticulitis (low suspicion for abcess per surgical notes) 03/2013 treated with abx..   2/26 - 3/12 admission to [facility] with colonic diverticular abcess in left pelvis and fistula.  Drain place to abcess on 2/27 and exchanged on 3/3.  Drain replaced 3/16 (after drain displaced).  Cultures of abcess show multiple organisms, none predominant.  Admission complicated by encephalopathy, chronic O2 requiring COPD with superimposed HCAP and flu, chronic Coumadin for hx A fib, protein calorie malnutrition.  She is diabetic.   5 different CTs and/or CT guided procedures since her 2/26 admission.  CT abdomen and pelvis 2/27 showed enlarging mildly complex abscesses within the pelvis 4.6 x 3.6 x 2.9 centimeter fluid collections as well as additional enlarging collections of fluid and air along the upper left pelvic side wall measuring 6.3 x 2.5 x 4.4 cm, diffuse diverticulosis, cholelithiasis.  Last CT 3/19 showed persistent abscess collections involving the left iliopsoas and sigmoid mesial colon, slightly larger than on previous exam.  Note is made that liver is nodular on CT scans going back to 2013.   She has developed bleeding per drain bag and in stools this week after the abcess drain was replaced on 3/16.   Hgb of 10.3 on 3/10               9.5 on 3/14               8.5  on 3/15              7.2 on 3/16  Transfused 2 units PRBCs 3/16              9.9 on 3/18              8.5 on 3/19 Coumadin, Lovenox stopped on 3/19 Was eating soft diet and tolerating well but due to stool in drain she was made NPO on 3/16 and TNA started 3/17 to allow for bowel rest. No nausea.  Family reports the blood and stool seen in the new drain as well as from hole where old drain had been.  The bleeding is medium to dark blood.  Son says the bleeding was more BR yesterday.  Pt has had ongoing and slightly worsening pain in last few days in region of catheter at lateral LLQ  C diff negative 3/19 and on 3/5.   Weight down 20# in 5 months Had colonoscopy over 20 years ago.  Never had EGD No hx ulcers or GIB  Past Medical History  Diagnosis Date  . Diabetes mellitus   . Obesity   .  COPD (chronic obstructive pulmonary disease)     Moderate. PFTs (12/10): FVC 67%, FEV1 58%, ratio 60%, TLC 95%, RV 138%, DLCO 43% (moderate obstructive defect). She is on home oxygen that should be worn at all times.  . Diastolic CHF, chronic     Echo (3/12): EF 55-60%, mild LV hypertrophy, grade I diastolic dysfunction, mild left atrial enlargement, normal pulmonary artery pressure  . CKD (chronic kidney disease)     Cr 2.4 when last checked. 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 PNA (pneumonia)     h/o with ARDS in 2005; MRSA PNA with prolonged hospitalization in [facility] regional 11/11  . Diverticulum     Jejunal  . OSA (obstructive sleep apnea)     possible. When pt was sedated for TEE in May 2010, she did develop some upper airway obstruction  . Anemia     of chronic disease  . Hypothyroidism   . Gout   . Atrial fibrillation 07/2008    Failed cardioversion with TEE guidance in May 2010. Pt went back to NSR by 11/2008 with amiodarne  . Hypertension   . GERD (gastroesophageal reflux disease)   . Peripheral vascular disease   . Pneumonia   . Chronic kidney disease    . Respiratory failure, chronic 07/01/2011  . Chronic gout due to renal impairment 07/01/2011    Past Surgical History  Procedure Laterality Date  . Tracheostomy      ARDS/ ICU admission, trach 2005  . Microperforation  08/2008    Of jejunem, ICU admission    Scheduled Meds: TNA Metronidazole IV Tygacil Albuterol Zebeta Budesonide Pepcid 20mg  po BID Synthroid Mucinex Oxycodone 5 mg po BID Probiotic: Risaquad MVI Torsemide  Tudorza Pressair inhaler.    Infusions:  PRN Meds:      Allergies as of 06/20/2013 - Review Complete 06/18/2013  Allergen Reaction Noted  . Sulfonamide derivatives Other (See Comments)     Family History  Problem Relation Age of Onset  . Emphysema Mother     and asthma  . Asthma Mother   . Heart disease Neg Hx     premature    History   Social History  . Marital Status: Married    Spouse Name: N/A    Number of Children: N/A  . Years of Education: N/A   Occupational History  . Retired     Previously worked as Production designer, theatre/television/film for [employer]   Social History Main Topics  . Smoking status: Former Smoker -- 1.00 packs/day for 23 years    Types: Cigarettes    Quit date: 04/11/1976  . Smokeless tobacco: Never Used     Comment: Started at age 77 (quit at age 55)  . Alcohol Use: Yes     Comment: a glass of wine occasionally (once a week)  . Drug Use: No  . Sexual Activity: Not Currently   Other Topics Concern  . Not on file   Social History Narrative   Splits living betqween dtr in [place] and son in [place]. Daughter [name] [phone].  Daughter is an NP. Pt emergency contact per [name] is Xan Sparkman (sister) [phone]    REVIEW OF SYSTEMS: Constitutional:  Per HPI ENT:  No nose bleeds Pulm:  Chronic cough, not productive.  Breathing near baseling CV:  No palpitations, no LE edema. No chest pain GU:  No hematuria, no frequency GI:  Per HPI Heme:  No hx of anemia    Transfusions:  None  before this week Neuro:  No  headaches, no peripheral tingling or numbness Derm:  No itching, no rash or sores.  Endocrine:  No sweats or chills.  No polyuria or dysuria Immunization:  Flu, pneumovax up to date.  Travel:  None beyond local counties in last few months.    PHYSICAL EXAM: Vital signs in last 24 hours: Filed Vitals:   06/24/13 1418  BP: 108/44  Pulse: 100  Resp: 22   Wt Readings from Last 3 Encounters:  06/20/13 84.3 kg (185 lb 13.6 oz)  05/20/13 85.276 kg (188 lb)  03/24/13 89.177 kg (196 lb 9.6 oz)  BP 140/68  Pulse 70  resp 20  sats 95%  General: obese, ill- appearing elderly WF.  She is comfortable  Head:  No asymmetry or swelling  Eyes:  No icterus.  Conj pale.  No icterus Ears:  Slightly HOH  Nose:  No congestion or discharge Mouth:  Clear, moist.  No blood or lesions Neck:  No mass, no JVD, no goiter Lungs:  Diminished, some upper airway ronchi. slightly labored breathing with cough.  Heart: RRR.  No MRG Abdomen:  Soft, ND, obese.  Active BS.  Perc drain is far lateral in LLQ, just about in flank.  Small sore just superior to the drain is site of previous drain.  There is maroon blood, but not stool in the drain bag.  She is sore in region of drain.   Rectal: deferred   Musc/Skeltl: no joint erythema or contracture Extremities:  No pedal / LE edema but scaly skin suggests previous brawny edema.  No sores  Neurologic:  Oriented x 3.  No tremor.  Moves all 4 limbs.  Able to give good history Skin:  No rash, jaundice, sores.  No telangectasia  Tattoos:  none Nodes:  No cervical adenopathy   Psych:  Cooperative, alert, not somnolent.   Intake/Output from previous day:   Intake/Output this shift:    LAB RESULTS:  Recent Labs  06/26/13 1130 06/27/13 0555  WBC 20.1* 16.3*  HGB 9.9* 8.5*  HCT 29.0* 25.5*  PLT 321 289  MCV    86  BMET Lab Results  Component Value Date   NA 137 06/27/2013   NA 139 06/26/2013   NA 147 06/25/2013   K 3.4* 06/27/2013   K 3.7 06/26/2013   K 4.1  06/25/2013   CL 97 06/27/2013   CL 98 06/26/2013   CL 104 06/25/2013   CO2 29 06/27/2013   CO2 30 06/26/2013   CO2 30 06/25/2013   GLUCOSE 210* 06/27/2013   GLUCOSE 168* 06/26/2013   GLUCOSE 81 06/25/2013   BUN 46* 06/27/2013   BUN 43* 06/26/2013   BUN 39* 06/25/2013   CREATININE 1.85* 06/27/2013   CREATININE 1.64* 06/26/2013   CREATININE 2.10* 06/25/2013   CALCIUM 8.3* 06/27/2013   CALCIUM 8.7 06/26/2013   CALCIUM 8.2* 06/25/2013   LFT  Recent Labs  06/26/13 1130  PROT 5.8*  ALBUMIN 1.9*  AST 14  ALT 13  ALKPHOS 94  BILITOT 0.7   PT/INR Lab Results  Component Value Date   INR 2.74* 06/27/2013   INR 1.86* 06/26/2013   INR 1.37 06/25/2013   C-Diff negative C diff 3/5 and 3/19  RADIOLOGY STUDIES: Ct Abdomen Pelvis Wo Contrast 06/27/2013   CLINICAL DATA:  Psoas abscess post drainage, cellulitis  EXAM: CT ABDOMEN AND PELVIS WITHOUT CONTRAST  TECHNIQUE: Multidetector CT imaging of the abdomen and pelvis was performed following the standard protocol  without intravenous contrast. Sagittal and coronal MPR images reconstructed from axial data set. Neither oral nor intravenous contrast administered.  COMPARISON:  06/24/2013, 06/21/2013, 06/07/2013  FINDINGS: Pigtail drainage catheter again identified within abscess collection in the left pelvis involving the left iliopsoas and adjacent sigmoid mesial colon.  Catheter is positioned predominately within the left adnexa/sigmoid mesocolon compartment.  Persistent 4.0 x 3.3 x 4.7 cm diameter collection within left iliopsoas, increased in size.  Small 3.1 x2.6 cm diameter phlegmon versus additional residual abscess at the sigmoid mesocolon.  Numerous sigmoid diverticula noted.  An additional 5.1 x 3.6 x 2.8 cm diameter collection is seen immediately adjacent to left lateral aspect of the urinary bladder, inferior to the uterus, corresponding to the site of extravasated GI contrast on the previous exam, slightly increased in size from the previous dimensions of  3.3 x 2.3 cm.  Air within urinary bladder.  Stool remaining bowel loops unremarkable.  No free intraperitoneal fluid.  Atrophic uterus  IMPRESSION: Persistent abscess collections involving the left iliopsoas and sigmoid mesial colon, slightly larger than on previous exam.   Electronically Signed   By: Ulyses SouthwardMark  Boles M.D.   On: 06/27/2013 13:58   Dg Chest Port 1 View 06/27/2013   CLINICAL DATA:  Respiratory failure.  EXAM: PORTABLE CHEST - 1 VIEW  COMPARISON:  DG CHEST 1V PORT dated 06/23/2013; DG CHEST 1V PORT dated 06/21/2013; CT IMAGE GUIDED FLUID DRAIN BY CATHETER dated 06/24/2013; CT PELVIS W/O CM dated 06/24/2013; CT ABD/PELV WO CM dated 06/21/2013  FINDINGS: PICC line positioning is stable in the SVC. There is a stable small to moderate-sized left pleural effusion with associated left lower lobe atelectasis. Mild atelectasis is present at the right lung base. No edema is identified. There is stable cardiomegaly.  IMPRESSION: Stable left pleural effusion with associated left lower lobe atelectasis. Mild atelectasis is present at the right lung base.   Electronically Signed   By: Irish LackGlenn  Yamagata M.D.   On: 06/27/2013 08:16    ENDOSCOPIC STUDIES: Remote colonoscopy.  No recall of and EGD  IMPRESSION:   *  Bleeding per diverticular abcess drain and in stool in setting of therapeutic INR.  Suspect something to do with recent drain replacement.  Low suspicion that this is a PUD related bleed.   *  ABL anemia.  S/p PRBC x 2.   *  Sigmoid diverticulitis in 03/2013 and recurrent in 05/2013 when she had abcess and fistula requiring drain placement   *  Cirrhotic looking liver per CT scan.  Never worked up.  No hx of UGIB, no varices or portal htn on imaging.   *  Oxygen dependent COPD with recent flu and HCAP  *  Hx jejunal diverticulitis in 2013.   *  Physical deconditioning.    *  CKD.     PLAN:     *  Per Dr Rhea BeltonPyrtle *  Check INR and CBC this AM.     Addendum 1445: 12:30 CBC with Hgb 8.1 and  INR 2.3.   Jennye MoccasinSarah Skyy Mcknight  06/15/2013, 10:48 AM Pager: [phone]

## 2013-06-28 ENCOUNTER — Encounter (HOSPITAL_COMMUNITY): Payer: Self-pay | Admitting: Emergency Medicine

## 2013-06-28 ENCOUNTER — Inpatient Hospital Stay (HOSPITAL_COMMUNITY)
Admission: EM | Admit: 2013-06-28 | Discharge: 2013-07-10 | DRG: 329 | Disposition: E | Payer: Medicare Other | Source: Ambulatory Visit | Attending: General Surgery | Admitting: General Surgery

## 2013-06-28 DIAGNOSIS — Z66 Do not resuscitate: Secondary | ICD-10-CM | POA: Diagnosis not present

## 2013-06-28 DIAGNOSIS — I472 Ventricular tachycardia, unspecified: Secondary | ICD-10-CM | POA: Diagnosis not present

## 2013-06-28 DIAGNOSIS — G935 Compression of brain: Secondary | ICD-10-CM | POA: Diagnosis not present

## 2013-06-28 DIAGNOSIS — I4891 Unspecified atrial fibrillation: Secondary | ICD-10-CM | POA: Diagnosis present

## 2013-06-28 DIAGNOSIS — L03319 Cellulitis of trunk, unspecified: Secondary | ICD-10-CM

## 2013-06-28 DIAGNOSIS — M1A9XX0 Chronic gout, unspecified, without tophus (tophi): Secondary | ICD-10-CM | POA: Diagnosis present

## 2013-06-28 DIAGNOSIS — E46 Unspecified protein-calorie malnutrition: Secondary | ICD-10-CM

## 2013-06-28 DIAGNOSIS — K922 Gastrointestinal hemorrhage, unspecified: Secondary | ICD-10-CM

## 2013-06-28 DIAGNOSIS — E43 Unspecified severe protein-calorie malnutrition: Secondary | ICD-10-CM | POA: Diagnosis present

## 2013-06-28 DIAGNOSIS — G9340 Encephalopathy, unspecified: Secondary | ICD-10-CM

## 2013-06-28 DIAGNOSIS — K5732 Diverticulitis of large intestine without perforation or abscess without bleeding: Secondary | ICD-10-CM

## 2013-06-28 DIAGNOSIS — J449 Chronic obstructive pulmonary disease, unspecified: Secondary | ICD-10-CM | POA: Diagnosis present

## 2013-06-28 DIAGNOSIS — Z87891 Personal history of nicotine dependence: Secondary | ICD-10-CM

## 2013-06-28 DIAGNOSIS — K632 Fistula of intestine: Secondary | ICD-10-CM

## 2013-06-28 DIAGNOSIS — Z7901 Long term (current) use of anticoagulants: Secondary | ICD-10-CM

## 2013-06-28 DIAGNOSIS — R52 Pain, unspecified: Secondary | ICD-10-CM

## 2013-06-28 DIAGNOSIS — K219 Gastro-esophageal reflux disease without esophagitis: Secondary | ICD-10-CM | POA: Diagnosis present

## 2013-06-28 DIAGNOSIS — G8190 Hemiplegia, unspecified affecting unspecified side: Secondary | ICD-10-CM | POA: Diagnosis not present

## 2013-06-28 DIAGNOSIS — R4701 Aphasia: Secondary | ICD-10-CM | POA: Diagnosis not present

## 2013-06-28 DIAGNOSIS — G936 Cerebral edema: Secondary | ICD-10-CM | POA: Diagnosis not present

## 2013-06-28 DIAGNOSIS — N321 Vesicointestinal fistula: Secondary | ICD-10-CM | POA: Diagnosis present

## 2013-06-28 DIAGNOSIS — I509 Heart failure, unspecified: Secondary | ICD-10-CM | POA: Diagnosis present

## 2013-06-28 DIAGNOSIS — Z9981 Dependence on supplemental oxygen: Secondary | ICD-10-CM

## 2013-06-28 DIAGNOSIS — I639 Cerebral infarction, unspecified: Secondary | ICD-10-CM

## 2013-06-28 DIAGNOSIS — R001 Bradycardia, unspecified: Secondary | ICD-10-CM

## 2013-06-28 DIAGNOSIS — N184 Chronic kidney disease, stage 4 (severe): Secondary | ICD-10-CM | POA: Diagnosis present

## 2013-06-28 DIAGNOSIS — I129 Hypertensive chronic kidney disease with stage 1 through stage 4 chronic kidney disease, or unspecified chronic kidney disease: Secondary | ICD-10-CM | POA: Diagnosis present

## 2013-06-28 DIAGNOSIS — G4733 Obstructive sleep apnea (adult) (pediatric): Secondary | ICD-10-CM | POA: Diagnosis present

## 2013-06-28 DIAGNOSIS — Z836 Family history of other diseases of the respiratory system: Secondary | ICD-10-CM

## 2013-06-28 DIAGNOSIS — Z515 Encounter for palliative care: Secondary | ICD-10-CM

## 2013-06-28 DIAGNOSIS — K651 Peritoneal abscess: Secondary | ICD-10-CM | POA: Diagnosis present

## 2013-06-28 DIAGNOSIS — J9600 Acute respiratory failure, unspecified whether with hypoxia or hypercapnia: Secondary | ICD-10-CM

## 2013-06-28 DIAGNOSIS — E662 Morbid (severe) obesity with alveolar hypoventilation: Secondary | ICD-10-CM | POA: Diagnosis present

## 2013-06-28 DIAGNOSIS — E871 Hypo-osmolality and hyponatremia: Secondary | ICD-10-CM | POA: Diagnosis not present

## 2013-06-28 DIAGNOSIS — M1A00X0 Idiopathic chronic gout, unspecified site, without tophus (tophi): Secondary | ICD-10-CM | POA: Diagnosis present

## 2013-06-28 DIAGNOSIS — R4020 Unspecified coma: Secondary | ICD-10-CM | POA: Diagnosis not present

## 2013-06-28 DIAGNOSIS — E119 Type 2 diabetes mellitus without complications: Secondary | ICD-10-CM | POA: Diagnosis present

## 2013-06-28 DIAGNOSIS — E2749 Other adrenocortical insufficiency: Secondary | ICD-10-CM | POA: Diagnosis present

## 2013-06-28 DIAGNOSIS — E039 Hypothyroidism, unspecified: Secondary | ICD-10-CM | POA: Diagnosis present

## 2013-06-28 DIAGNOSIS — J9620 Acute and chronic respiratory failure, unspecified whether with hypoxia or hypercapnia: Secondary | ICD-10-CM | POA: Diagnosis not present

## 2013-06-28 DIAGNOSIS — K630 Abscess of intestine: Principal | ICD-10-CM | POA: Diagnosis present

## 2013-06-28 DIAGNOSIS — J4489 Other specified chronic obstructive pulmonary disease: Secondary | ICD-10-CM | POA: Diagnosis present

## 2013-06-28 DIAGNOSIS — I739 Peripheral vascular disease, unspecified: Secondary | ICD-10-CM | POA: Diagnosis present

## 2013-06-28 DIAGNOSIS — Z882 Allergy status to sulfonamides status: Secondary | ICD-10-CM

## 2013-06-28 DIAGNOSIS — I4729 Other ventricular tachycardia: Secondary | ICD-10-CM | POA: Diagnosis not present

## 2013-06-28 DIAGNOSIS — Z9049 Acquired absence of other specified parts of digestive tract: Secondary | ICD-10-CM

## 2013-06-28 DIAGNOSIS — I2510 Atherosclerotic heart disease of native coronary artery without angina pectoris: Secondary | ICD-10-CM | POA: Diagnosis present

## 2013-06-28 DIAGNOSIS — E876 Hypokalemia: Secondary | ICD-10-CM | POA: Diagnosis not present

## 2013-06-28 DIAGNOSIS — I5032 Chronic diastolic (congestive) heart failure: Secondary | ICD-10-CM | POA: Diagnosis present

## 2013-06-28 DIAGNOSIS — D689 Coagulation defect, unspecified: Secondary | ICD-10-CM | POA: Diagnosis present

## 2013-06-28 DIAGNOSIS — D638 Anemia in other chronic diseases classified elsewhere: Secondary | ICD-10-CM | POA: Diagnosis present

## 2013-06-28 DIAGNOSIS — I6340 Cerebral infarction due to embolism of unspecified cerebral artery: Secondary | ICD-10-CM | POA: Diagnosis not present

## 2013-06-28 DIAGNOSIS — Z825 Family history of asthma and other chronic lower respiratory diseases: Secondary | ICD-10-CM

## 2013-06-28 DIAGNOSIS — E440 Moderate protein-calorie malnutrition: Secondary | ICD-10-CM

## 2013-06-28 DIAGNOSIS — K5720 Diverticulitis of large intestine with perforation and abscess without bleeding: Secondary | ICD-10-CM | POA: Diagnosis present

## 2013-06-28 DIAGNOSIS — L02219 Cutaneous abscess of trunk, unspecified: Secondary | ICD-10-CM | POA: Diagnosis present

## 2013-06-28 DIAGNOSIS — B3749 Other urogenital candidiasis: Secondary | ICD-10-CM | POA: Diagnosis not present

## 2013-06-28 DIAGNOSIS — J439 Emphysema, unspecified: Secondary | ICD-10-CM

## 2013-06-28 DIAGNOSIS — IMO0002 Reserved for concepts with insufficient information to code with codable children: Secondary | ICD-10-CM

## 2013-06-28 LAB — CBC
HCT: 24.4 % — ABNORMAL LOW (ref 36.0–46.0)
Hemoglobin: 8.1 g/dL — ABNORMAL LOW (ref 12.0–15.0)
MCH: 29.8 pg (ref 26.0–34.0)
MCHC: 33.2 g/dL (ref 30.0–36.0)
MCV: 89.7 fL (ref 78.0–100.0)
PLATELETS: 266 10*3/uL (ref 150–400)
RBC: 2.72 MIL/uL — ABNORMAL LOW (ref 3.87–5.11)
RDW: 22.1 % — AB (ref 11.5–15.5)
WBC: 12.3 10*3/uL — AB (ref 4.0–10.5)

## 2013-06-28 LAB — BASIC METABOLIC PANEL
BUN: 43 mg/dL — ABNORMAL HIGH (ref 6–23)
CALCIUM: 8.3 mg/dL — AB (ref 8.4–10.5)
CHLORIDE: 99 meq/L (ref 96–112)
CO2: 27 meq/L (ref 19–32)
Creatinine, Ser: 1.34 mg/dL — ABNORMAL HIGH (ref 0.50–1.10)
GFR calc non Af Amer: 36 mL/min — ABNORMAL LOW (ref >90)
GFR, EST AFRICAN AMERICAN: 42 mL/min — AB (ref >90)
Glucose, Bld: 218 mg/dL — ABNORMAL HIGH (ref 70–99)
Potassium: 3.9 mEq/L (ref 3.7–5.3)
SODIUM: 140 meq/L (ref 137–147)

## 2013-06-28 LAB — PROTIME-INR
INR: 2.32 — ABNORMAL HIGH (ref 0.00–1.49)
Prothrombin Time: 24.7 seconds — ABNORMAL HIGH (ref 11.6–15.2)

## 2013-06-28 LAB — GLUCOSE, CAPILLARY: GLUCOSE-CAPILLARY: 157 mg/dL — AB (ref 70–99)

## 2013-06-28 MED ORDER — DIPHENHYDRAMINE HCL 12.5 MG/5ML PO ELIX
12.5000 mg | ORAL_SOLUTION | Freq: Four times a day (QID) | ORAL | Status: DC | PRN
  Filled 2013-06-28: qty 5

## 2013-06-28 MED ORDER — ADULT MULTIVITAMIN W/MINERALS CH
1.0000 | ORAL_TABLET | Freq: Every day | ORAL | Status: DC
  Administered 2013-06-29 – 2013-06-30 (×2): 1 via ORAL
  Filled 2013-06-28 (×5): qty 1

## 2013-06-28 MED ORDER — TIOTROPIUM BROMIDE MONOHYDRATE 18 MCG IN CAPS
18.0000 ug | ORAL_CAPSULE | Freq: Every day | RESPIRATORY_TRACT | Status: DC
  Administered 2013-06-29 – 2013-07-03 (×5): 18 ug via RESPIRATORY_TRACT
  Filled 2013-06-28 (×2): qty 5

## 2013-06-28 MED ORDER — VANCOMYCIN HCL IN DEXTROSE 750-5 MG/150ML-% IV SOLN
750.0000 mg | INTRAVENOUS | Status: DC
  Administered 2013-06-28 – 2013-07-02 (×4): 750 mg via INTRAVENOUS
  Filled 2013-06-28 (×4): qty 150

## 2013-06-28 MED ORDER — POTASSIUM CHLORIDE CRYS ER 20 MEQ PO TBCR
20.0000 meq | EXTENDED_RELEASE_TABLET | Freq: Every day | ORAL | Status: DC
  Administered 2013-06-29: 20 meq via ORAL
  Filled 2013-06-28 (×2): qty 1

## 2013-06-28 MED ORDER — PREDNISONE 10 MG PO TABS
10.0000 mg | ORAL_TABLET | Freq: Every day | ORAL | Status: DC
  Administered 2013-06-29 – 2013-07-03 (×5): 10 mg via ORAL
  Filled 2013-06-28 (×8): qty 1

## 2013-06-28 MED ORDER — ALBUTEROL SULFATE (2.5 MG/3ML) 0.083% IN NEBU
2.5000 mg | INHALATION_SOLUTION | Freq: Two times a day (BID) | RESPIRATORY_TRACT | Status: DC
  Administered 2013-06-29 – 2013-06-30 (×4): 2.5 mg via RESPIRATORY_TRACT
  Filled 2013-06-28 (×4): qty 3

## 2013-06-28 MED ORDER — HYDROCODONE-ACETAMINOPHEN 5-325 MG PO TABS
2.0000 | ORAL_TABLET | ORAL | Status: DC | PRN
  Administered 2013-06-28: 2 via ORAL
  Filled 2013-06-28: qty 2

## 2013-06-28 MED ORDER — GUAIFENESIN 200 MG PO TABS
400.0000 mg | ORAL_TABLET | Freq: Two times a day (BID) | ORAL | Status: DC
  Administered 2013-06-28 – 2013-07-02 (×8): 400 mg via ORAL
  Filled 2013-06-28 (×11): qty 2

## 2013-06-28 MED ORDER — ONDANSETRON HCL 4 MG/2ML IJ SOLN: 4.0000 mg | Freq: Four times a day (QID) | INTRAMUSCULAR | Status: DC | PRN

## 2013-06-28 MED ORDER — BARRIER CREAM NON-SPECIFIED
1.0000 "application " | TOPICAL_CREAM | Freq: Three times a day (TID) | TOPICAL | Status: DC | PRN
  Filled 2013-06-28: qty 1

## 2013-06-28 MED ORDER — KCL IN DEXTROSE-NACL 20-5-0.45 MEQ/L-%-% IV SOLN
INTRAVENOUS | Status: AC
  Administered 2013-06-28: 20 mL/h via INTRAVENOUS
  Filled 2013-06-28: qty 1000

## 2013-06-28 MED ORDER — SODIUM CHLORIDE 0.9 % IV SOLN
1.0000 g | INTRAVENOUS | Status: DC
  Administered 2013-06-28 – 2013-07-05 (×8): 1 g via INTRAVENOUS
  Filled 2013-06-28 (×10): qty 1

## 2013-06-28 MED ORDER — MORPHINE SULFATE 2 MG/ML IJ SOLN
1.0000 mg | INTRAMUSCULAR | Status: DC | PRN
  Administered 2013-06-29 – 2013-06-30 (×5): 2 mg via INTRAVENOUS
  Administered 2013-06-30: 4 mg via INTRAVENOUS
  Administered 2013-07-01 (×3): 2 mg via INTRAVENOUS
  Administered 2013-07-01 – 2013-07-02 (×3): 4 mg via INTRAVENOUS
  Administered 2013-07-02: 2 mg via INTRAVENOUS
  Administered 2013-07-02 – 2013-07-03 (×4): 4 mg via INTRAVENOUS
  Filled 2013-06-28: qty 2
  Filled 2013-06-28 (×2): qty 1
  Filled 2013-06-28: qty 2
  Filled 2013-06-28 (×2): qty 1
  Filled 2013-06-28 (×2): qty 2
  Filled 2013-06-28: qty 1
  Filled 2013-06-28: qty 2
  Filled 2013-06-28: qty 1
  Filled 2013-06-28 (×3): qty 2
  Filled 2013-06-28 (×3): qty 1

## 2013-06-28 MED ORDER — TRAMADOL HCL 50 MG PO TABS: 25.0000 mg | ORAL_TABLET | Freq: Two times a day (BID) | ORAL | Status: DC | PRN

## 2013-06-28 MED ORDER — FAMOTIDINE 20 MG PO TABS
20.0000 mg | ORAL_TABLET | Freq: Every day | ORAL | Status: DC
Start: 2013-06-29 — End: 2013-07-04
  Administered 2013-06-29 – 2013-07-02 (×4): 20 mg via ORAL
  Filled 2013-06-28 (×6): qty 1

## 2013-06-28 MED ORDER — ACETAMINOPHEN 325 MG PO TABS: 650.0000 mg | ORAL_TABLET | Freq: Four times a day (QID) | ORAL | Status: DC | PRN

## 2013-06-28 MED ORDER — DEXTROSE 10 % IV SOLN
INTRAVENOUS | Status: AC
  Administered 2013-06-29: 60 mL/h via INTRAVENOUS

## 2013-06-28 MED ORDER — TORSEMIDE 20 MG PO TABS
80.0000 mg | ORAL_TABLET | Freq: Every day | ORAL | Status: DC
  Administered 2013-06-29 – 2013-07-02 (×4): 80 mg via ORAL
  Filled 2013-06-28 (×5): qty 4

## 2013-06-28 MED ORDER — ACETAMINOPHEN 650 MG RE SUPP: 650.0000 mg | Freq: Four times a day (QID) | RECTAL | Status: DC | PRN

## 2013-06-28 MED ORDER — LEVOTHYROXINE SODIUM 50 MCG PO TABS
50.0000 ug | ORAL_TABLET | Freq: Every day | ORAL | Status: DC
  Administered 2013-06-29 – 2013-07-03 (×5): 50 ug via ORAL
  Filled 2013-06-28 (×8): qty 1

## 2013-06-28 MED ORDER — DIPHENHYDRAMINE HCL 50 MG/ML IJ SOLN: 12.5000 mg | Freq: Four times a day (QID) | INTRAMUSCULAR | Status: DC | PRN

## 2013-06-28 MED ORDER — BISOPROLOL FUMARATE 5 MG PO TABS
5.0000 mg | ORAL_TABLET | Freq: Every day | ORAL | Status: DC
  Administered 2013-06-29 – 2013-07-03 (×5): 5 mg via ORAL
  Filled 2013-06-28 (×5): qty 1

## 2013-06-28 MED ORDER — GUAIFENESIN 400 MG PO TABS: 400.0000 mg | ORAL_TABLET | Freq: Two times a day (BID) | ORAL | Status: DC

## 2013-06-28 NOTE — ED Notes (Signed)
Spoke with Kriste BasqueBecky, Secretary re: repaging general surgery, Fredderick PhenixBelfi, MD updated

## 2013-06-28 NOTE — Consult Note (Signed)
Patient seen, examined, and I agree with the above documentation, including the assessment and plan. Complicated diverticulitis with intraabd abscesses and recent replacement of perc drain by IR Now with bleeding and now with probable fistula formation No role for flexible endoscopy in this setting Hold anticoagulation, surgery following Trend Hgb, transfuse as needed Will sign off, call with questions

## 2013-06-28 NOTE — ED Notes (Signed)
Pyrtle, MD GI consult at bedside

## 2013-06-28 NOTE — ED Provider Notes (Signed)
CSN: [id number]     Arrival date & time 06/23/2013  1557 History   First MD Initiated Contact with Patient 07/07/2013 1650     Chief Complaint  Patient presents with  . Post-op Problem     (Consider location/radiation/quality/duration/timing/severity/associated sxs/prior Treatment) HPI Comments: [facility]. She was admitted to [facility] on February 27 with a diverticular abscess with fistula formation. She had a percutaneous drainage by interventional radiology. [facility] This past Monday the drain was removed and a new drain was placed by interventional radiology. It was recently noted to be draining bloody fluid. A repeat CT scan was done yesterday showing a large fluid collection. She was sent over here for surgical evaluation. She does note increasing pain to her left lower abdomen. She's had no nausea or vomiting. She denies any fevers.   Past Medical History  Diagnosis Date  . Diabetes mellitus   . Obesity   . COPD (chronic obstructive pulmonary disease)     Moderate. PFTs (12/10): FVC 67%, FEV1 58%, ratio 60%, TLC 95%, RV 138%, DLCO 43% (moderate obstructive defect). She is on home oxygen that should be worn at all times.  . Diastolic CHF, chronic     Echo (3/12): EF 55-60%, mild LV hypertrophy, grade I diastolic dysfunction, mild left atrial enlargement, normal pulmonary artery pressure  . CKD (chronic kidney disease)     Cr 2.4 when last checked. 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 PNA (pneumonia)     h/o with ARDS in 2005; MRSA PNA with prolonged hospitalization in [facility] 11/11  . Diverticulum     Jejunal  . OSA (obstructive sleep apnea)     possible. When pt was sedated for TEE in May 2010, she did develop some upper airway obstruction  . Anemia     of chronic disease  .  Hypothyroidism   . Gout   . Atrial fibrillation 07/2008    Failed cardioversion with TEE guidance in May 2010. Pt went back to NSR by 11/2008 with amiodarne  . Hypertension   . GERD (gastroesophageal reflux disease)   . Peripheral vascular disease   . Pneumonia   . Chronic kidney disease   . Respiratory failure, chronic 07/01/2011  . Chronic gout due to renal impairment 07/01/2011   Past Surgical History  Procedure Laterality Date  . Tracheostomy      ARDS/ ICU admission, trach 2005  . Microperforation  08/2008    Of jejunem, ICU admission   Family History  Problem Relation Age of Onset  . Emphysema Mother     and asthma  . Asthma Mother   . Heart disease Neg Hx     premature   History  Substance Use Topics  . Smoking status: Former Smoker -- 1.00 packs/day for 23 years    Types: Cigarettes    Quit date: 04/11/1976  . Smokeless tobacco: Never Used     Comment: Started at age 41 (quit at age 46)  . Alcohol Use: Yes     Comment: a glass of wine occasionally (once a week)   OB History   Grav Para Term Preterm Abortions TAB SAB Ect Mult Living                 Review of Systems  Constitutional: Positive for fatigue. Negative for fever, chills and diaphoresis.  HENT: Negative  for congestion, rhinorrhea and sneezing.   Eyes: Negative.   Respiratory: Positive for cough. Negative for chest tightness and shortness of breath.   Cardiovascular: Negative for chest pain and leg swelling.  Gastrointestinal: Positive for abdominal pain. Negative for nausea, vomiting, diarrhea and blood in stool.  Genitourinary: Negative for frequency, hematuria, flank pain and difficulty urinating.  Musculoskeletal: Negative for arthralgias and back pain.  Skin: Negative for rash.  Neurological: Negative for dizziness, speech difficulty, weakness, numbness and headaches.      Allergies  Sulfonamide derivatives  Home Medications   Current Outpatient Rx  Name  Route  Sig  Dispense  Refill   . albuterol (PROVENTIL) (2.5 MG/3ML) 0.083% nebulizer solution   Nebulization   Take 3 mLs (2.5 mg total) by nebulization 2 (two) times daily. Dx: 492.8 COPD with Emphysema   270 mL   5   . Amino Acids-Protein Hydrolys (FEEDING SUPPLEMENT, PRO-STAT SUGAR FREE 64,) LIQD   Oral   Take 30 mLs by mouth daily.   900 mL   0   . barrier cream (NON-SPECIFIED) CREA   Topical   Apply 1 application topically 3 (three) times daily as needed (place around drain area to keep it dry and clean).         . bisoprolol (ZEBETA) 10 MG tablet   Oral   Take 5 mg by mouth daily.         . feeding supplement, GLUCERNA SHAKE, (GLUCERNA SHAKE) LIQD   Oral   Take 237 mLs by mouth 2 (two) times daily between meals.      0   . guaifenesin (HUMIBID E) 400 MG TABS   Oral   Take 400 mg by mouth 2 (two) times daily.          . heparin 100-0.45 UNIT/ML-% infusion   Intravenous   Inject 1,250 Units/hr into the vein continuous.   250 mL      . HYDROcodone-acetaminophen (NORCO/VICODIN) 5-325 MG per tablet   Oral   Take 2 tablets by mouth every 4 (four) hours as needed for moderate pain.   30 tablet   0   . levothyroxine (SYNTHROID, LEVOTHROID) 50 MCG tablet   Oral   Take 50 mcg by mouth daily.         . Multiple Vitamin (MULITIVITAMIN WITH MINERALS) TABS   Oral   Take 1 tablet by mouth daily.         . piperacillin-tazobactam (ZOSYN) 3.375 GM/50ML IVPB   Intravenous   Inject 50 mLs (3.375 g total) into the vein every 8 (eight) hours.   50 mL      . potassium chloride SA (K-DUR,KLOR-CON) 20 MEQ tablet   Oral   Take 20 mEq by mouth daily.         . predniSONE (DELTASONE) 10 MG tablet   Oral   Take 10 mg by mouth daily with breakfast.         . ranitidine (ZANTAC) 150 MG capsule   Oral   Take 150 mg by mouth every evening.           . tiotropium (SPIRIVA HANDIHALER) 18 MCG inhalation capsule   Inhalation   Place 1 capsule (18 mcg total) into inhaler and inhale daily.    30 capsule   11   . torsemide (DEMADEX) 20 MG tablet   Oral   Take 80 mg by mouth daily.         . traMADol (ULTRAM) 50 MG tablet  Oral   Take 0.5 tablets (25 mg total) by mouth every 12 (twelve) hours as needed for moderate pain.   10 tablet   0   . Vancomycin (VANCOCIN) 750 MG/150ML SOLN   Intravenous   Inject 150 mLs (750 mg total) into the vein every other day.         . warfarin (COUMADIN) 2 MG tablet   Oral   Take 2 mg by mouth daily at 6 PM.          BP 145/53  Pulse 80  Temp(Src) 99.4 F (37.4 C) (Oral)  Resp 14  SpO2 99% Physical Exam  Constitutional: She is oriented to person, place, and time. She appears well-developed and well-nourished.  HENT:  Head: Normocephalic and atraumatic.  Eyes: Pupils are equal, round, and reactive to light.  Neck: Normal range of motion. Neck supple.  Cardiovascular: Normal rate, regular rhythm and normal heart sounds.   Pulmonary/Chest: Effort normal. No respiratory distress. She has wheezes. She has no rales. She exhibits no tenderness.  Rhonchi bilaterally  Abdominal: Soft. Bowel sounds are normal. There is tenderness (Left lower abdomen). There is no rebound and no guarding.  There is a percutaneous drain in the left lower quadrant abdomen. It is draining some dark fluid. There is an area of erythema to  the lower abdomen and upper left thigh.  Musculoskeletal: Normal range of motion. She exhibits no edema.  Lymphadenopathy:    She has no cervical adenopathy.  Neurological: She is alert and oriented to person, place, and time.  Skin: Skin is warm and dry. No rash noted.  Psychiatric: She has a normal mood and affect.    ED Course  Procedures (including critical care time) Labs Review Labs Reviewed - No data to display Imaging Review Ct Abdomen Pelvis Wo Contrast  06/27/2013   CLINICAL DATA:  Psoas abscess post drainage, cellulitis  EXAM: CT ABDOMEN AND PELVIS WITHOUT CONTRAST  TECHNIQUE: Multidetector CT imaging  of the abdomen and pelvis was performed following the standard protocol without intravenous contrast. Sagittal and coronal MPR images reconstructed from axial data set. Neither oral nor intravenous contrast administered.  COMPARISON:  06/24/2013, 06/21/2013, 06/07/2013  FINDINGS: Pigtail drainage catheter again identified within abscess collection in the left pelvis involving the left iliopsoas and adjacent sigmoid mesial colon.  Catheter is positioned predominately within the left adnexa/sigmoid mesocolon compartment.  Persistent 4.0 x 3.3 x 4.7 cm diameter collection within left iliopsoas, increased in size.  Small 3.1 x2.6 cm diameter phlegmon versus additional residual abscess at the sigmoid mesocolon.  Numerous sigmoid diverticula noted.  An additional 5.1 x 3.6 x 2.8 cm diameter collection is seen immediately adjacent to left lateral aspect of the urinary bladder, inferior to the uterus, corresponding to the site of extravasated GI contrast on the previous exam, slightly increased in size from the previous dimensions of 3.3 x 2.3 cm.  Air within urinary bladder.  Stool remaining bowel loops unremarkable.  No free intraperitoneal fluid.  Atrophic uterus  IMPRESSION: Persistent abscess collections involving the left iliopsoas and sigmoid mesial colon, slightly larger than on previous exam.   Electronically Signed   By: Ulyses SouthwardMark  Boles M.D.   On: 06/27/2013 13:58   Dg Chest Port 1 View  06/27/2013   CLINICAL DATA:  Respiratory failure.  EXAM: PORTABLE CHEST - 1 VIEW  COMPARISON:  DG CHEST 1V PORT dated 06/23/2013; DG CHEST 1V PORT dated 06/21/2013; CT IMAGE GUIDED FLUID DRAIN BY CATHETER dated 06/24/2013; CT PELVIS W/O  CM dated 06/24/2013; CT ABD/PELV WO CM dated 06/21/2013  FINDINGS: PICC line positioning is stable in the SVC. There is a stable small to moderate-sized left pleural effusion with associated left lower lobe atelectasis. Mild atelectasis is present at the right lung base. No edema is identified. There is  stable cardiomegaly.  IMPRESSION: Stable left pleural effusion with associated left lower lobe atelectasis. Mild atelectasis is present at the right lung base.   Electronically Signed   By: Irish Lack M.D.   On: 06/27/2013 08:16     EKG Interpretation None      MDM   Final diagnoses:  Colonic diverticular abscess    I contacted general surgery who is to see the patient.  Reviewed labs from earlier today.  WBC 12K, down from 16K yesterday.    Rolan Bucco, MD 06/19/2013 [phone]

## 2013-06-28 NOTE — Progress Notes (Signed)
ANTIBIOTIC + ANTICOAGULANT CONSULT NOTE - INITIAL  Pharmacy Consult for heparin, vancomycin & TPN Indication: Afib, cellulitis, EC fistula  Allergies  Allergen Reactions  . Sulfonamide Derivatives Other (See Comments)    Childhood reaction    Patient Measurements:   Adjusted Body Weight:   Vital Signs: Temp: 99.4 F (37.4 C) (03/20 1644) Temp src: Oral (03/20 1644) BP: 136/55 mmHg (03/20 2030) Pulse Rate: 79 (03/20 2030) Intake/Output from previous day:   Intake/Output from this shift:    Labs:  Recent Labs  06/26/13 1130 06/27/13 0555 06/23/2013 1230  WBC 20.1* 16.3* 12.3*  HGB 9.9* 8.5* 8.1*  PLT 321 289 266  CREATININE 1.64* 1.85* 1.34*   The CrCl is unknown because both a height and weight (above a minimum accepted value) are required for this calculation. No results found for this basename: VANCOTROUGH, Leodis BinetVANCOPEAK, VANCORANDOM, GENTTROUGH, GENTPEAK, GENTRANDOM, TOBRATROUGH, TOBRAPEAK, TOBRARND, AMIKACINPEAK, AMIKACINTROU, AMIKACIN,  in the last 72 hours   Microbiology: Recent Results (from the past 720 hour(s))  URINE CULTURE     Status: None   Collection Time    06/06/13  8:25 PM      Result Value Ref Range Status   Specimen Description URINE, CATHETERIZED   Final   Special Requests ADDED [phone]   Final   Culture  Setup Time     Final   Value: 06/07/2013 01:19     Performed at [facility]   Colony Count     Final   Value: NO GROWTH     Performed at [facility]   Culture     Final   Value: NO GROWTH     Performed at [facility]   Report Status 06/07/2013 FINAL   Final  CULTURE, BLOOD (ROUTINE X 2)     Status: None   Collection Time    06/06/13 11:20 PM      Result Value Ref Range Status   Specimen Description BLOOD RIGHT WRIST   Final   Special Requests BOTTLES DRAWN AEROBIC AND ANAEROBIC 3CC   Final   Culture  Setup Time     Final   Value: 06/07/2013 03:27     Performed at [facility]   Culture     Final    Value: NO GROWTH 5 DAYS     Performed at [facility]   Report Status 06/13/2013 FINAL   Final  CULTURE, BLOOD (ROUTINE X 2)     Status: None   Collection Time    06/06/13 11:30 PM      Result Value Ref Range Status   Specimen Description BLOOD RIGHT HAND   Final   Special Requests BOTTLES DRAWN AEROBIC AND ANAEROBIC 5CC   Final   Culture  Setup Time     Final   Value: 06/07/2013 03:27     Performed at [facility]   Culture     Final   Value: NO GROWTH 5 DAYS     Performed at [facility]   Report Status 06/13/2013 FINAL   Final  MRSA PCR SCREENING     Status: None   Collection Time    06/07/13  3:23 AM      Result Value Ref Range Status   MRSA by PCR NEGATIVE  NEGATIVE Final   Comment:            The GeneXpert MRSA Assay (FDA     approved for NASAL specimens     only),  is one component of a     comprehensive MRSA colonization     surveillance program. It is not     intended to diagnose MRSA     infection nor to guide or     monitor treatment for     MRSA infections.  CULTURE, ROUTINE-ABSCESS     Status: None   Collection Time    06/07/13  6:48 PM      Result Value Ref Range Status   Specimen Description PERITONEAL CAVITY   Final   Special Requests DIVERTICULAR ABSCESS   Final   Gram Stain     Final   Value: MODERATE WBC PRESENT,BOTH PMN AND MONONUCLEAR     NO SQUAMOUS EPITHELIAL CELLS SEEN     RARE GRAM POSITIVE COCCI     RARE GRAM NEGATIVE RODS     RARE GRAM POSITIVE RODS   Culture     Final   Value: MULTIPLE ORGANISMS PRESENT, NONE PREDOMINANT     Note: NO STAPHYLOCOCCUS AUREUS ISOLATED NO GROUP A STREP (S.PYOGENES) ISOLATED     Performed at [facility]   Report Status 06/11/2013 FINAL   Final  CLOSTRIDIUM DIFFICILE BY PCR     Status: None   Collection Time    06/13/13  1:58 PM      Result Value Ref Range Status   C difficile by pcr NEGATIVE  NEGATIVE Final  SURGICAL PCR SCREEN     Status: None   Collection Time     06/18/13 12:01 AM      Result Value Ref Range Status   MRSA, PCR NEGATIVE  NEGATIVE Final   Staphylococcus aureus NEGATIVE  NEGATIVE Final   Comment:            The Xpert SA Assay (FDA     approved for NASAL specimens     in patients over 6 years of age),     is one component of     a comprehensive surveillance     program.  Test performance has     been validated by [employer] for patients greater     than or equal to 40 year old.     It is not intended     to diagnose infection nor to     guide or monitor treatment.  CULTURE, BLOOD (ROUTINE X 2)     Status: None   Collection Time    06/25/13 10:05 PM      Result Value Ref Range Status   Specimen Description BLOOD RIGHT HAND   Final   Special Requests BOTTLES DRAWN AEROBIC ONLY 3CC   Final   Culture  Setup Time     Final   Value: 06/26/2013 01:03     Performed at [facility]   Culture     Final   Value:        BLOOD CULTURE RECEIVED NO GROWTH TO DATE CULTURE WILL BE HELD FOR 5 DAYS BEFORE ISSUING A FINAL NEGATIVE REPORT     Performed at [facility]   Report Status PENDING   Incomplete  CULTURE, BLOOD (ROUTINE X 2)     Status: None   Collection Time    06/25/13 10:10 PM      Result Value Ref Range Status   Specimen Description BLOOD LEFT WRIST   Final   Special Requests BOTTLES DRAWN AEROBIC ONLY 3CC   Final   Culture  Setup Time  Final   Value: 06/26/2013 01:03     Performed at [facility]   Culture     Final   Value:        BLOOD CULTURE RECEIVED NO GROWTH TO DATE CULTURE WILL BE HELD FOR 5 DAYS BEFORE ISSUING A FINAL NEGATIVE REPORT     Performed at [facility]   Report Status PENDING   Incomplete  CLOSTRIDIUM DIFFICILE BY PCR     Status: None   Collection Time    06/27/13  3:11 AM      Result Value Ref Range Status   C difficile by pcr NEGATIVE  NEGATIVE Final    Medical History: Past Medical History  Diagnosis Date  . Diabetes mellitus   . Obesity   . COPD  (chronic obstructive pulmonary disease)     Moderate. PFTs (12/10): FVC 67%, FEV1 58%, ratio 60%, TLC 95%, RV 138%, DLCO 43% (moderate obstructive defect). She is on home oxygen that should be worn at all times.  . Diastolic CHF, chronic     Echo (3/12): EF 55-60%, mild LV hypertrophy, grade I diastolic dysfunction, mild left atrial enlargement, normal pulmonary artery pressure  . CKD (chronic kidney disease)     Cr 2.4 when last checked. 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 PNA (pneumonia)     h/o with ARDS in 2005; MRSA PNA with prolonged hospitalization in [facility] regional 11/11  . Diverticulum     Jejunal  . OSA (obstructive sleep apnea)     possible. When pt was sedated for TEE in May 2010, she did develop some upper airway obstruction  . Anemia     of chronic disease  . Hypothyroidism   . Gout   . Atrial fibrillation 07/2008    Failed cardioversion with TEE guidance in May 2010. Pt went back to NSR by 11/2008 with amiodarne  . Hypertension   . GERD (gastroesophageal reflux disease)   . Peripheral vascular disease   . Pneumonia   . Chronic kidney disease   . Respiratory failure, chronic 07/01/2011  . Chronic gout due to renal impairment 07/01/2011    Medications:  Anti-infectives   Start     Dose/Rate Route Frequency Ordered Stop   06/10/2013 2200  vancomycin (VANCOCIN) IVPB 750 mg/150 ml premix     750 mg 150 mL/hr over 60 Minutes Intravenous Every 24 hours 06/30/2013 2104       Assessment: 83 yof with complicated history transferred from OSH tonight. She was on TPN which will be resumed tomorrow. Pt has been on multiple antibiotics which are being changed to vancomycin + ertapenem. Pt was also on coumadin for afib. INR is therapeutic. Planning to hold coumadin and start IV heparin when INR falls <2.   Goal of Therapy:  Vancomycin trough level 10-15 mcg/ml Heparin level 0.3-0.7  Plan:  1. Vancomycin 750mg  IV Q24H 2. F/u renal fxn, C&S,  clinical status and trough at [facility] 3. TPN labs and consult to dietician 4. D10 at 38ml/hr when current TPN finishes 5. Daily INR - start IV heparin when INR<2 6. Will f/u further records from [facility]  Trevontae Lindahl, Drake Leach 07/07/2013,9:05 PM

## 2013-06-28 NOTE — ED Notes (Signed)
Pt from specialty floor, per Report pt had diverticular abcess drained & was admitted to her floor for rehab, the drain was dislodged & replaced by interventional radiology, post reinsertion & CT revealed an abcess at the same area & the drain is not draining, GI consult must be completed by surgeon who completed surgery, consult to be completed in ED, upon arrival to ED, TPN is running at 60 mL/hr, flagyl is running at 14900mL/hr & [facility], pt has PICC line in R upper arm, drain in place, is A&Ox4, in A fib, family at beside

## 2013-06-28 NOTE — H&P (Signed)
[name] is an 80 y.o. female.   Chief Complaint: diverticular abscess with controlled colocutaneous fistula HPI:  Patient is an 80 year old female who was recently the hospital with pneumonia and the flu who subsequently developed diverticulitis with diverticular abscess.  A drain was placed. There was stool leaking from the abscess cavity. She has had her drain attempted to be upsized and continued to have worsening pain this week. She has continued to become weaker and unable to get out of bed. She had significant pain and cellulitis of the drain insertion site.  Denies fevers and chills. She has also been on antibiotics throughout this time. She's been on TNA and has been n.p.o. other than her pills.  Past Medical History  Diagnosis Date  . Diabetes mellitus   . Obesity   . COPD (chronic obstructive pulmonary disease)     Moderate. PFTs (12/10): FVC 67%, FEV1 58%, ratio 60%, TLC 95%, RV 138%, DLCO 43% (moderate obstructive defect). She is on home oxygen that should be worn at all times.  . Diastolic CHF, chronic     Echo (3/12): EF 55-60%, mild LV hypertrophy, grade I diastolic dysfunction, mild left atrial enlargement, normal pulmonary artery pressure  . CKD (chronic kidney disease)     Cr 2.4 when last checked. ANCA +, pauci-immune glomerulnephritis followed by Dr. Marval Regal  . PNA (pneumonia)     h/o with ARDS in 2005; MRSA PNA with prolonged hospitalization in [facility] regional 11/11  . Diverticulum     Jejunal  . OSA (obstructive sleep apnea)     possible. When pt was sedated for TEE in May 2010, she did develop some upper airway obstruction  . Anemia     of chronic disease  . Hypothyroidism   . Gout   . Atrial fibrillation 07/2008    Failed cardioversion with TEE guidance in May 2010. Pt went back to NSR by 11/2008 with amiodarne  . Hypertension   . GERD (gastroesophageal reflux disease)   . Peripheral vascular disease   . Pneumonia   . Chronic kidney disease   .  Respiratory failure, chronic 07/01/2011  . Chronic gout due to renal impairment 07/01/2011    Past Surgical History  Procedure Laterality Date  . Tracheostomy      ARDS/ ICU admission, trach 2005  . Microperforation  08/2008    Of jejunem, ICU admission    Family History  Problem Relation Age of Onset  . Emphysema Mother     and asthma  . Asthma Mother   . Heart disease Neg Hx     premature   Social History:  reports that she quit smoking about 37 years ago. Her smoking use included Cigarettes. She has a 23 pack-year smoking history. She has never used smokeless tobacco. She reports that she drinks alcohol. She reports that she does not use illicit drugs.  Allergies:  Allergies  Allergen Reactions  . Sulfonamide Derivatives Other (See Comments)    Childhood reaction     (Not in a hospital admission)  Results for orders placed during the hospital encounter of 06/20/13 (from the past 48 hour(s))  OCCULT BLOOD X 1 CARD TO LAB, STOOL     Status: Abnormal   Collection Time    06/27/13  1:52 AM      Result Value Ref Range   Fecal Occult Bld POSITIVE (*) NEGATIVE  CLOSTRIDIUM DIFFICILE BY PCR     Status: None   Collection Time    06/27/13  3:11  AM      Result Value Ref Range   C difficile by pcr NEGATIVE  NEGATIVE  PHOSPHORUS     Status: None   Collection Time    06/27/13  5:55 AM      Result Value Ref Range   Phosphorus 3.2  2.3 - 4.6 mg/dL  CBC WITH DIFFERENTIAL     Status: Abnormal   Collection Time    06/27/13  5:55 AM      Result Value Ref Range   WBC 16.3 (*) 4.0 - 10.5 K/uL   RBC 2.90 (*) 3.87 - 5.11 MIL/uL   Hemoglobin 8.5 (*) 12.0 - 15.0 g/dL   HCT 25.5 (*) 36.0 - 46.0 %   MCV 87.9  78.0 - 100.0 fL   MCH 29.3  26.0 - 34.0 pg   MCHC 33.3  30.0 - 36.0 g/dL   RDW 19.1 (*) 11.5 - 15.5 %   Platelets 289  150 - 400 K/uL   Neutrophils Relative % 79 (*) 43 - 77 %   Lymphocytes Relative 12  12 - 46 %   Monocytes Relative 8  3 - 12 %   Eosinophils Relative 0  0 -  5 %   Basophils Relative 1  0 - 1 %   Neutro Abs 12.8 (*) 1.7 - 7.7 K/uL   Lymphs Abs 2.0  0.7 - 4.0 K/uL   Monocytes Absolute 1.3 (*) 0.1 - 1.0 K/uL   Eosinophils Absolute 0.0  0.0 - 0.7 K/uL   Basophils Absolute 0.2 (*) 0.0 - 0.1 K/uL   RBC Morphology RARE NRBCs     Comment: MARKED POLYCHROMASIA     SPHEROCYTES   WBC Morphology MILD LEFT SHIFT (1-5% METAS, OCC MYELO, OCC BANDS)     Comment: ATYPICAL LYMPHOCYTES  BASIC METABOLIC PANEL     Status: Abnormal   Collection Time    06/27/13  5:55 AM      Result Value Ref Range   Sodium 137  137 - 147 mEq/L   Potassium 3.4 (*) 3.7 - 5.3 mEq/L   Chloride 97  96 - 112 mEq/L   CO2 29  19 - 32 mEq/L   Glucose, Bld 210 (*) 70 - 99 mg/dL   BUN 46 (*) 6 - 23 mg/dL   Creatinine, Ser 1.85 (*) 0.50 - 1.10 mg/dL   Calcium 8.3 (*) 8.4 - 10.5 mg/dL   GFR calc non Af Amer 24 (*) >90 mL/min   GFR calc Af Amer 28 (*) >90 mL/min   Comment: (NOTE)     The eGFR has been calculated using the CKD EPI equation.     This calculation has not been validated in all clinical situations.     eGFR's persistently <90 mL/min signify possible Chronic Kidney     Disease.  PROTIME-INR     Status: Abnormal   Collection Time    06/27/13  5:55 AM      Result Value Ref Range   Prothrombin Time 28.1 (*) 11.6 - 15.2 seconds   INR 2.74 (*) 0.00 - 1.49  CBC     Status: Abnormal   Collection Time    06/11/2013 12:30 PM      Result Value Ref Range   WBC 12.3 (*) 4.0 - 10.5 K/uL   RBC 2.72 (*) 3.87 - 5.11 MIL/uL   Hemoglobin 8.1 (*) 12.0 - 15.0 g/dL   HCT 24.4 (*) 36.0 - 46.0 %   MCV 89.7  78.0 - 100.0 fL  MCH 29.8  26.0 - 34.0 pg   MCHC 33.2  30.0 - 36.0 g/dL   RDW 22.1 (*) 11.5 - 15.5 %   Platelets 266  150 - 400 K/uL  BASIC METABOLIC PANEL     Status: Abnormal   Collection Time    06/12/2013 12:30 PM      Result Value Ref Range   Sodium 140  137 - 147 mEq/L   Potassium 3.9  3.7 - 5.3 mEq/L   Chloride 99  96 - 112 mEq/L   CO2 27  19 - 32 mEq/L   Glucose, Bld  218 (*) 70 - 99 mg/dL   BUN 43 (*) 6 - 23 mg/dL   Creatinine, Ser 1.34 (*) 0.50 - 1.10 mg/dL   Calcium 8.3 (*) 8.4 - 10.5 mg/dL   GFR calc non Af Amer 36 (*) >90 mL/min   GFR calc Af Amer 42 (*) >90 mL/min   Comment: (NOTE)     The eGFR has been calculated using the CKD EPI equation.     This calculation has not been validated in all clinical situations.     eGFR's persistently <90 mL/min signify possible Chronic Kidney     Disease.  PROTIME-INR     Status: Abnormal   Collection Time    06/22/2013 12:30 PM      Result Value Ref Range   Prothrombin Time 24.7 (*) 11.6 - 15.2 seconds   INR 2.32 (*) 0.00 - 1.49   Ct Abdomen Pelvis Wo Contrast  06/27/2013   CLINICAL DATA:  Psoas abscess post drainage, cellulitis  EXAM: CT ABDOMEN AND PELVIS WITHOUT CONTRAST  TECHNIQUE: Multidetector CT imaging of the abdomen and pelvis was performed following the standard protocol without intravenous contrast. Sagittal and coronal MPR images reconstructed from axial data set. Neither oral nor intravenous contrast administered.  COMPARISON:  06/24/2013, 06/21/2013, 06/07/2013  FINDINGS: Pigtail drainage catheter again identified within abscess collection in the left pelvis involving the left iliopsoas and adjacent sigmoid mesial colon.  Catheter is positioned predominately within the left adnexa/sigmoid mesocolon compartment.  Persistent 4.0 x 3.3 x 4.7 cm diameter collection within left iliopsoas, increased in size.  Small 3.1 x2.6 cm diameter phlegmon versus additional residual abscess at the sigmoid mesocolon.  Numerous sigmoid diverticula noted.  An additional 5.1 x 3.6 x 2.8 cm diameter collection is seen immediately adjacent to left lateral aspect of the urinary bladder, inferior to the uterus, corresponding to the site of extravasated GI contrast on the previous exam, slightly increased in size from the previous dimensions of 3.3 x 2.3 cm.  Air within urinary bladder.  Stool remaining bowel loops unremarkable.  No  free intraperitoneal fluid.  Atrophic uterus  IMPRESSION: Persistent abscess collections involving the left iliopsoas and sigmoid mesial colon, slightly larger than on previous exam.   Electronically Signed   By: Lavonia Dana M.D.   On: 06/27/2013 13:58   Dg Chest Port 1 View  06/27/2013   CLINICAL DATA:  Respiratory failure.  EXAM: PORTABLE CHEST - 1 VIEW  COMPARISON:  DG CHEST 1V PORT dated 06/23/2013; DG CHEST 1V PORT dated 06/21/2013; CT IMAGE GUIDED FLUID DRAIN BY CATHETER dated 06/24/2013; CT PELVIS W/O CM dated 06/24/2013; CT ABD/PELV WO CM dated 06/21/2013  FINDINGS: PICC line positioning is stable in the SVC. There is a stable small to moderate-sized left pleural effusion with associated left lower lobe atelectasis. Mild atelectasis is present at the right lung base. No edema is identified. There is stable cardiomegaly.  IMPRESSION: Stable left pleural effusion with associated left lower lobe atelectasis. Mild atelectasis is present at the right lung base.   Electronically Signed   By: Aletta Edouard M.D.   On: 06/27/2013 08:16    Review of Systems  Constitutional: Positive for malaise/fatigue.  HENT: Negative.   Eyes: Negative.   Respiratory: Positive for wheezing.   Cardiovascular: Positive for leg swelling.  Gastrointestinal: Positive for abdominal pain.  Genitourinary: Negative.   Skin:       Swelling, redness left hip  Neurological: Positive for weakness.  Endo/Heme/Allergies:       On blood thinners   Psychiatric/Behavioral: Negative.     Blood pressure 135/60, pulse 76, temperature 99.4 F (37.4 C), temperature source Oral, resp. rate 22, SpO2 99.00%. Physical Exam  Constitutional: She is oriented to person, place, and time. She appears well-developed and well-nourished. No distress.  HENT:  Head: Normocephalic and atraumatic.  Eyes: Conjunctivae are normal. Pupils are equal, round, and reactive to light. No scleral icterus.  Neck: Normal range of motion. Neck supple.    Cardiovascular: Normal rate, regular rhythm, normal heart sounds and intact distal pulses.   Respiratory: Effort normal. No respiratory distress. She has wheezes. She has no rales. She exhibits no tenderness.  GI: Soft. She exhibits no distension and no mass. There is tenderness (LLQ). There is no rebound and no guarding.  Neurological: She is alert and oriented to person, place, and time.  Skin: Skin is warm and dry. No rash noted. She is not diaphoretic. There is erythema. No pallor.     Red line represents cellulitis.  Black line represents drain insertion site.    Psychiatric: She has a normal mood and affect. Her behavior is normal. Judgment and thought content normal.     Assessment/Plan Diverticulitis with abscess and colocutaneous fistula. Will see if IR thinks drain can be repositioned.   Will change antibiotics. I think it is likely that she will need surgery.  She is not improving with current therapy. TNA for fistula and severe protein calorie malnutrition.   Atrial fibrillation - beta blockade, anticoagulation.  Switch to heparin when INR falls.   CHF - cards consult to eval for possible surgery COPD - pulmonary consult to evaluate for surgery. CKD - creatinine improving.  Renal dose medications.  When TNA runs out, will add D10 until tomorrow when new TNA can be made.    BYERLY,FAERA 06/10/2013, 8:17 PM

## 2013-06-29 DIAGNOSIS — L02219 Cutaneous abscess of trunk, unspecified: Secondary | ICD-10-CM

## 2013-06-29 DIAGNOSIS — L03319 Cellulitis of trunk, unspecified: Secondary | ICD-10-CM

## 2013-06-29 LAB — COMPREHENSIVE METABOLIC PANEL
ALK PHOS: 81 U/L (ref 39–117)
ALT: 9 U/L (ref 0–35)
AST: 9 U/L (ref 0–37)
Albumin: 1.7 g/dL — ABNORMAL LOW (ref 3.5–5.2)
BILIRUBIN TOTAL: 0.3 mg/dL (ref 0.3–1.2)
BUN: 39 mg/dL — ABNORMAL HIGH (ref 6–23)
CHLORIDE: 99 meq/L (ref 96–112)
CO2: 32 meq/L (ref 19–32)
Calcium: 8.4 mg/dL (ref 8.4–10.5)
Creatinine, Ser: 1.33 mg/dL — ABNORMAL HIGH (ref 0.50–1.10)
GFR calc Af Amer: 42 mL/min — ABNORMAL LOW (ref >90)
GFR, EST NON AFRICAN AMERICAN: 36 mL/min — AB (ref >90)
Glucose, Bld: 250 mg/dL — ABNORMAL HIGH (ref 70–99)
POTASSIUM: 3.5 meq/L — AB (ref 3.7–5.3)
SODIUM: 142 meq/L (ref 137–147)
Total Protein: 6 g/dL (ref 6.0–8.3)

## 2013-06-29 LAB — DIFFERENTIAL
BASOS PCT: 1 % (ref 0–1)
Basophils Absolute: 0.1 10*3/uL (ref 0.0–0.1)
EOS PCT: 2 % (ref 0–5)
Eosinophils Absolute: 0.2 10*3/uL (ref 0.0–0.7)
Lymphocytes Relative: 14 % (ref 12–46)
Lymphs Abs: 1.4 10*3/uL (ref 0.7–4.0)
MONO ABS: 1.1 10*3/uL — AB (ref 0.1–1.0)
Monocytes Relative: 11 % (ref 3–12)
NEUTROS PCT: 72 % (ref 43–77)
Neutro Abs: 6.9 10*3/uL (ref 1.7–7.7)

## 2013-06-29 LAB — GLUCOSE, CAPILLARY
GLUCOSE-CAPILLARY: 218 mg/dL — AB (ref 70–99)
GLUCOSE-CAPILLARY: 149 mg/dL — AB (ref 70–99)
Glucose-Capillary: 247 mg/dL — ABNORMAL HIGH (ref 70–99)
Glucose-Capillary: 202 mg/dL — ABNORMAL HIGH (ref 70–99)

## 2013-06-29 LAB — CBC
HEMATOCRIT: 24.9 % — AB (ref 36.0–46.0)
HEMOGLOBIN: 8 g/dL — AB (ref 12.0–15.0)
MCH: 29.9 pg (ref 26.0–34.0)
MCHC: 32.1 g/dL (ref 30.0–36.0)
MCV: 92.9 fL (ref 78.0–100.0)
Platelets: 281 10*3/uL (ref 150–400)
RBC: 2.68 MIL/uL — ABNORMAL LOW (ref 3.87–5.11)
RDW: 23.1 % — ABNORMAL HIGH (ref 11.5–15.5)
WBC: 9.7 10*3/uL (ref 4.0–10.5)

## 2013-06-29 LAB — PROTIME-INR
INR: 2.89 — ABNORMAL HIGH (ref 0.00–1.49)
PROTHROMBIN TIME: 29.2 s — AB (ref 11.6–15.2)

## 2013-06-29 LAB — MAGNESIUM: Magnesium: 1.7 mg/dL (ref 1.5–2.5)

## 2013-06-29 LAB — TRIGLYCERIDES: Triglycerides: 284 mg/dL — ABNORMAL HIGH (ref <150)

## 2013-06-29 LAB — PHOSPHORUS: Phosphorus: 2.7 mg/dL (ref 2.3–4.6)

## 2013-06-29 LAB — PREALBUMIN: Prealbumin: 10.5 mg/dL — ABNORMAL LOW (ref 17.0–34.0)

## 2013-06-29 MED ORDER — FAT EMULSION 20 % IV EMUL
240.0000 mL | INTRAVENOUS | Status: AC
  Administered 2013-06-29: 240 mL via INTRAVENOUS
  Filled 2013-06-29: qty 250

## 2013-06-29 MED ORDER — ALBUTEROL SULFATE (2.5 MG/3ML) 0.083% IN NEBU
2.5000 mg | INHALATION_SOLUTION | RESPIRATORY_TRACT | Status: DC | PRN
  Administered 2013-06-30: 2.5 mg via RESPIRATORY_TRACT
  Filled 2013-06-29: qty 3

## 2013-06-29 MED ORDER — FAMOTIDINE 20 MG PO TABS: 20.0000 mg | ORAL_TABLET | Freq: Every day | ORAL | Status: DC

## 2013-06-29 MED ORDER — INSULIN ASPART 100 UNIT/ML ~~LOC~~ SOLN: 0.0000 [IU] | Freq: Three times a day (TID) | SUBCUTANEOUS | Status: DC

## 2013-06-29 MED ORDER — INSULIN ASPART 100 UNIT/ML ~~LOC~~ SOLN: 0.0000 [IU] | Freq: Four times a day (QID) | SUBCUTANEOUS | Status: DC

## 2013-06-29 MED ORDER — CLINIMIX E/DEXTROSE (5/15) 5 % IV SOLN
INTRAVENOUS | Status: AC
Start: 2013-06-29 — End: 2013-06-30
  Administered 2013-06-29: 19:00:00 via INTRAVENOUS
  Filled 2013-06-29: qty 2000

## 2013-06-29 MED ORDER — SODIUM CHLORIDE 0.9 % IJ SOLN
10.0000 mL | INTRAMUSCULAR | Status: DC | PRN
  Administered 2013-07-01: 20 mL
  Administered 2013-07-02 – 2013-07-04 (×5): 10 mL

## 2013-06-29 MED ORDER — SODIUM CHLORIDE 0.9 % IJ SOLN
10.0000 mL | Freq: Two times a day (BID) | INTRAMUSCULAR | Status: DC
  Administered 2013-07-04 – 2013-07-06 (×4): 10 mL

## 2013-06-29 MED ORDER — SODIUM CHLORIDE 0.45 % IV SOLN
INTRAVENOUS | Status: DC
  Administered 2013-06-29: 20 mL/h via INTRAVENOUS
  Administered 2013-07-02: 16:00:00 via INTRAVENOUS
  Administered 2013-07-03: 20 mL/h via INTRAVENOUS

## 2013-06-29 MED ORDER — INSULIN ASPART 100 UNIT/ML ~~LOC~~ SOLN
0.0000 [IU] | Freq: Four times a day (QID) | SUBCUTANEOUS | Status: DC
  Administered 2013-06-29: 7 [IU] via SUBCUTANEOUS
  Administered 2013-06-29: 3 [IU] via SUBCUTANEOUS
  Administered 2013-06-29: 7 [IU] via SUBCUTANEOUS
  Administered 2013-06-30 (×2): 11 [IU] via SUBCUTANEOUS
  Administered 2013-06-30 (×2): 4 [IU] via SUBCUTANEOUS
  Administered 2013-07-01: 11 [IU] via SUBCUTANEOUS

## 2013-06-29 MED ORDER — BUDESONIDE 0.5 MG/2ML IN SUSP
0.5000 mg | Freq: Two times a day (BID) | RESPIRATORY_TRACT | Status: DC
  Administered 2013-06-30 – 2013-07-06 (×11): 0.5 mg via RESPIRATORY_TRACT
  Filled 2013-06-29 (×18): qty 2

## 2013-06-29 MED ORDER — POTASSIUM CHLORIDE CRYS ER 20 MEQ PO TBCR
20.0000 meq | EXTENDED_RELEASE_TABLET | ORAL | Status: AC
  Administered 2013-06-29 (×2): 20 meq via ORAL
  Filled 2013-06-29 (×2): qty 1

## 2013-06-29 NOTE — Progress Notes (Signed)
Notified Greg, pharmacist, regarding TNA order.  Orders were to change from TNA to D10 after last current back of TNA.  After T N A  Finished. We restarted D10 around  5 am.

## 2013-06-29 NOTE — Consult Note (Signed)
[facility] Medical Consultation  [name] ZOX:[id number] DOB: [birth year] DOA: 07/07/2013 PCP: Shan Levans, MD   Requesting physician: Dr Donell Beers Date of consultation: 06-29-2013 Reason for consultation: Medical management of chronic medical problems.   Impression/Recommendations Active Problems:   Colonic diverticular abscess  Diverticulitis with abscess and colocutaneous fistula;  -Continue with IV antibiotics ( Vancomycin, Invanz) -Management per surgery.   Malnutrition, protein caloric;  -continue with TPN. She is also on D 10. Monitor for pulmonary edema.   Hypokalemia; replete with 20 meq times 2.   Atrial fibrillation: rate controlled.  -Continue with bisoprolol.   COPD with chronic resp failure:  - Will continue home inhalers  - Chronic prednisone and oxygen supplementation.   CKD Stage IV: Cr range 1.5 to 2.0; stable. Monitor on torsemide.   Hypothyroidism; continue with synthroid.   Chronic diastolic heart failure: compensated at this moment.  -Last EF 60-65%  -Continue with torsemide.  -strict I's and O's, daily weight .    I will followup again tomorrow. Please contact me if I can be of assistance in the meanwhile. Thank you for this consultation.  Chief Complaint: Diverticular Abscess.   HPI: Patient is an 80 year old female who was recently the hospital with pneumonia and the flu who subsequently developed diverticulitis with diverticular abscess. A drain was placed. She was admitted from [facility] by [facility] due to leaking stool from the abscess cavity. She has had her drain attempted to be upsized and continued to have worsening pain this week.  Patient seen and examined. She denies worsening dyspnea. She relates spiriva work very well for her. She uses albuterol BID.  She denies chest pain.       Review of Systems:  Negative except as per HPI.   Past Medical History  Diagnosis Date  . Diabetes mellitus   . Obesity   . COPD (chronic  obstructive pulmonary disease)     Moderate. PFTs (12/10): FVC 67%, FEV1 58%, ratio 60%, TLC 95%, RV 138%, DLCO 43% (moderate obstructive defect). She is on home oxygen that should be worn at all times.  . Diastolic CHF, chronic     Echo (3/12): EF 55-60%, mild LV hypertrophy, grade I diastolic dysfunction, mild left atrial enlargement, normal pulmonary artery pressure  . CKD (chronic kidney disease)     Cr 2.4 when last checked. 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 PNA (pneumonia)     h/o with ARDS in 2005; MRSA PNA with prolonged hospitalization in [facility] regional 11/11  . Diverticulum     Jejunal  . OSA (obstructive sleep apnea)     possible. When pt was sedated for TEE in May 2010, she did develop some upper airway obstruction  . Anemia     of chronic disease  . Hypothyroidism   . Gout   . Atrial fibrillation 07/2008    Failed cardioversion with TEE guidance in May 2010. Pt went back to NSR by 11/2008 with amiodarne  . Hypertension   . GERD (gastroesophageal reflux disease)   . Peripheral vascular disease   . Pneumonia   . Chronic kidney disease   . Respiratory failure, chronic 07/01/2011  . Chronic gout due to renal impairment 07/01/2011   Past Surgical History  Procedure Laterality Date  . Tracheostomy      ARDS/ ICU admission, trach 2005  . Microperforation  08/2008    Of jejunem, ICU admission   Social History:  reports that she quit smoking about 37 years ago.  Her smoking use included Cigarettes. She has a 23 pack-year smoking history. She has never used smokeless tobacco. She reports that she drinks alcohol. She reports that she does not use illicit drugs.  Allergies  Allergen Reactions  . Sulfonamide Derivatives Other (See Comments)    Childhood reaction   Family History  Problem Relation Age of Onset  . Emphysema Mother     and asthma  . Asthma Mother   . Heart disease Neg Hx     premature    Prior to Admission medications    Medication Sig Start Date End Date Taking? Authorizing Provider  Aclidinium Bromide (TUDORZA PRESSAIR) 400 MCG/ACT AEPB Inhale 1 puff into the lungs 2 (two) times daily.   Yes Historical Provider, MD  albuterol (PROVENTIL) (2.5 MG/3ML) 0.083% nebulizer solution Take 3 mLs (2.5 mg total) by nebulization 2 (two) times daily. Dx: 492.8 COPD with Emphysema 04/15/13  Yes Storm FriskPatrick E Wright, MD  Amino Acids-Protein Hydrolys (FEEDING SUPPLEMENT, PRO-STAT SUGAR FREE 64,) LIQD Take 30 mLs by mouth 2 (two) times daily.   Yes Historical Provider, MD  bisoprolol (ZEBETA) 10 MG tablet Take 5 mg by mouth daily.   Yes Historical Provider, MD  budesonide (PULMICORT) 0.5 MG/2ML nebulizer solution Take 0.5 mg by nebulization 2 (two) times daily.   Yes Historical Provider, MD  famotidine (PEPCID) 20 MG tablet Take 20 mg by mouth daily.   Yes Historical Provider, MD  guaiFENesin (MUCINEX) 600 MG 12 hr tablet Take 600 mg by mouth 2 (two) times daily.   Yes Historical Provider, MD  insulin aspart (NOVOLOG) 100 UNIT/ML injection Inject into the skin every 6 (six) hours. Sliding scale   Yes Historical Provider, MD  levothyroxine (SYNTHROID, LEVOTHROID) 50 MCG tablet Take 50 mcg by mouth daily.   Yes Historical Provider, MD  metroNIDAZOLE (FLAGYL) 5-0.79 MG/ML-% IVPB Inject 500 mg into the vein every 6 (six) hours.   Yes Historical Provider, MD  Multiple Vitamins-Minerals (MULTIVITAMINS THER. W/MINERALS) TABS tablet Take 1 tablet by mouth daily.   Yes Historical Provider, MD  oxyCODONE (OXY IR/ROXICODONE) 5 MG immediate release tablet Take 5 mg by mouth every 12 (twelve) hours.   Yes Historical Provider, MD  Probiotic Product (RISA-BID PROBIOTIC PO) Take 1 capsule by mouth 2 (two) times daily.   Yes Historical Provider, MD  tigecycline (TYGACIL) 50 MG injection Inject 50 mg into the vein 2 (two) times daily.   Yes Historical Provider, MD  torsemide (DEMADEX) 20 MG tablet Take 80 mg by mouth daily.   Yes Historical Provider, MD   barrier cream (NON-SPECIFIED) CREA Apply 1 application topically 3 (three) times daily as needed (place around drain area to keep it dry and clean). 06/20/13   Vassie Lollarlos Madera, MD   Physical Exam: Blood pressure 122/64, pulse 78, temperature 97.2 F (36.2 C), temperature source Oral, resp. rate 18, height 5\' 3"  (1.6 m), weight 84 kg (185 lb 3 oz), SpO2 96.00%. Filed Vitals:   06/29/13 0617  BP: 122/64  Pulse: 78  Temp: 97.2 F (36.2 C)  Resp: 18     General:  No acute distress.   Eyes: PERLA, EOM intact.   ENT: no tonsillar enlargement.   Neck: supple, no thyromegaly.   Cardiovascular: S 1, S 2 RRR  Respiratory: No wheezes, no crackles, few crackles.   Abdomen: Bs presents, drainage in place, no guarding.   Skin: no rashes.   Neurologic: Non Focal.   Labs on Admission:  Basic Metabolic Panel:  Recent Labs Lab  06/24/13 0500 06/25/13 0500 06/26/13 1130 06/27/13 0555 06/26/2013 1230 06/29/13 0519  NA 148* 147 139 137 140 142  K 4.0 4.1 3.7 3.4* 3.9 3.5*  CL 102 104 98 97 99 99  CO2 29 30 30 29 27  32  GLUCOSE 94 81 168* 210* 218* 250*  BUN 33* 39* 43* 46* 43* 39*  CREATININE 2.19* 2.10* 1.64* 1.85* 1.34* 1.33*  CALCIUM 7.8* 8.2* 8.7 8.3* 8.3* 8.4  MG 1.4* 1.9 1.7  --   --  1.7  PHOS 3.0 3.2 2.9 3.2  --  2.7   Liver Function Tests:  Recent Labs Lab 06/24/13 0500 06/26/13 1130 06/29/13 0519  AST 11 14 9   ALT 10 13 9   ALKPHOS 69 94 81  BILITOT 0.6 0.7 0.3  PROT 5.2* 5.8* 6.0  ALBUMIN 1.7* 1.9* 1.7*   No results found for this basename: LIPASE, AMYLASE,  in the last 168 hours No results found for this basename: AMMONIA,  in the last 168 hours CBC:  Recent Labs Lab 06/24/13 0500 06/25/13 0500 06/26/13 1130 06/27/13 0555 06/14/2013 1230 06/29/13 0519  WBC 13.2* 18.7* 20.1* 16.3* 12.3* 9.7  NEUTROABS 10.2* 15.5* 16.5* 12.8*  --  6.9  HGB 7.2* 7.3* 9.9* 8.5* 8.1* 8.0*  HCT 22.4* 21.7* 29.0* 25.5* 24.4* 24.9*  MCV 86.2 84.8 85.5 87.9 89.7 92.9  PLT  312 342 321 289 266 281   Cardiac Enzymes: No results found for this basename: CKTOTAL, CKMB, CKMBINDEX, TROPONINI,  in the last 168 hours BNP: No components found with this basename: POCBNP,  CBG:  Recent Labs Lab 06/23/2013 2219 06/29/13 0745 06/29/13 1252  GLUCAP 157* 218* 247*    Radiological Exams on Admission: No results found.    Time spent: 55 minutes.   Hartley Barefoot A [facility] Pager [phone]  If 7PM-7AM, please contact night-coverage [email or web address] Password [facility] 06/29/2013, 5:28 PM

## 2013-06-29 NOTE — Progress Notes (Addendum)
Subjective: Pt doing fair; still with abd discomfort at drain site and occ cough  Objective: Vital signs in last 24 hours: Temp:  [97.2 F (36.2 C)-99.4 F (37.4 C)] 97.2 F (36.2 C) (03/21 0617) Pulse Rate:  [71-87] 78 (03/21 0617) Resp:  [14-22] 18 (03/21 0617) BP: (122-145)/(46-64) 122/64 mmHg (03/21 0617) SpO2:  [97 %-100 %] 97 % (03/21 0617) Weight:  [185 lb 3 oz (84 kg)] 185 lb 3 oz (84 kg) (03/21 0700)    Intake/Output from previous day: 03/20 0701 - 03/21 0700 In: 200 [IV Piggyback:200] Out: 60 [Drains:60] Intake/Output this shift:    Left pelvic drain intact, insertion site with erythema, some skin maceration, small amt drainage of feculent material around insertion site  and on gauze; output 60 cc's dark brown(feculent) material- small amt old dark blood in bag; drain flushed with 10 cc's sterile NS with return of liquid stool; chest- few scatt wheezes, dim BS bases; heart- irreg  Lab Results:   Recent Labs  06/22/2013 1230 06/29/13 0519  WBC 12.3* 9.7  HGB 8.1* 8.0*  HCT 24.4* 24.9*  PLT 266 281   BMET  Recent Labs  07/02/2013 1230 06/29/13 0519  NA 140 142  K 3.9 3.5*  CL 99 99  CO2 27 32  GLUCOSE 218* 250*  BUN 43* 39*  CREATININE 1.34* 1.33*  CALCIUM 8.3* 8.4   PT/INR  Recent Labs  07/07/2013 1230 06/29/13 0519  LABPROT 24.7* 29.2*  INR 2.32* 2.89*   ABG No results found for this basename: PHART, PCO2, PO2, HCO3,  in the last 72 hours  Studies/Results: Ct Abdomen Pelvis Wo Contrast  06/27/2013   CLINICAL DATA:  Psoas abscess post drainage, cellulitis  EXAM: CT ABDOMEN AND PELVIS WITHOUT CONTRAST  TECHNIQUE: Multidetector CT imaging of the abdomen and pelvis was performed following the standard protocol without intravenous contrast. Sagittal and coronal MPR images reconstructed from axial data set. Neither oral nor intravenous contrast administered.  COMPARISON:  06/24/2013, 06/21/2013, 06/07/2013  FINDINGS: Pigtail drainage catheter again  identified within abscess collection in the left pelvis involving the left iliopsoas and adjacent sigmoid mesial colon.  Catheter is positioned predominately within the left adnexa/sigmoid mesocolon compartment.  Persistent 4.0 x 3.3 x 4.7 cm diameter collection within left iliopsoas, increased in size.  Small 3.1 x2.6 cm diameter phlegmon versus additional residual abscess at the sigmoid mesocolon.  Numerous sigmoid diverticula noted.  An additional 5.1 x 3.6 x 2.8 cm diameter collection is seen immediately adjacent to left lateral aspect of the urinary bladder, inferior to the uterus, corresponding to the site of extravasated GI contrast on the previous exam, slightly increased in size from the previous dimensions of 3.3 x 2.3 cm.  Air within urinary bladder.  Stool remaining bowel loops unremarkable.  No free intraperitoneal fluid.  Atrophic uterus  IMPRESSION: Persistent abscess collections involving the left iliopsoas and sigmoid mesial colon, slightly larger than on previous exam.   Electronically Signed   By: Ulyses Southward M.D.   On: 06/27/2013 13:58    Anti-infectives: Anti-infectives   Start     Dose/Rate Route Frequency Ordered Stop   06/22/2013 2300  ertapenem (INVANZ) 1 g in sodium chloride 0.9 % 50 mL IVPB     1 g 100 mL/hr over 30 Minutes Intravenous Every 24 hours 06/26/2013 2142     06/10/2013 2200  vancomycin (VANCOCIN) IVPB 750 mg/150 ml premix     750 mg 150 mL/hr over 60 Minutes Intravenous Every 24 hours 06/25/2013 2104  Assessment/Plan: s/p left pelvic/diverticular abscess (+fistula) drainage 2/27, upsized 3/16; note made of [facility] request for ? drain repositioning- will review latest CT with Dr. Bonnielee HaffHoss  LOS: 1 day   Addendum: latest CT reviewed by Dr. Bonnielee HaffHoss- he does not recommend repositioning of current drain; we could place additional drain in collection medial to current drain site but only if pt does not have surgery next week. You may page Dr. Bonnielee HaffHoss at [phone] with any further  questions. Jocilyn Trego,D [facility] 06/29/2013

## 2013-06-29 NOTE — Progress Notes (Signed)
INITIAL NUTRITION ASSESSMENT  DOCUMENTATION CODES Per approved criteria  -Obesity Unspecified   INTERVENTION:  TPN dosing per Pharmacy to meet >90% of estimated nutrition needs as able.  May be unable to meet nutrition goals due to premixed Clinimix solution.  NUTRITION DIAGNOSIS: Inadequate oral intake related to inability to eat as evidenced by NPO status.   Goal: Intake to meet >90% of estimated nutrition needs.  Monitor:  TPN tolerance/adequacy, weight trend, labs, ability to transition to enteral nutrition.  Reason for Assessment: Consult for new TPN  80 y.o. female  Admitting Dx: Diverticular abscess with controlled colocutaneous fistula  ASSESSMENT: Patient is an 80 year old female who was recently in the hospital with pneumonia and the flu who subsequently developed diverticulitis with diverticular abscess. A drain was placed. There was stool leaking from the abscess cavity. She has continued to become weaker and unable to get out of bed. She's been on TPN and has been n.p.o. other than her pills. [facility]  At [facility], was on Clinimix 4.25/25 at 160mL/hr with NO lipids. Weight was down to 177 lb at the beginning of last hospitalization (3/6). Weight now up to 185 lb, likely partially related to fluid status.  Patient to begin receiving TPN later today with Clinimix E 5/15 @ 60 ml/hr and lipids @ 10 ml/hr to provide 1680 ml, 1502 kcal, and 72 grams protein per day. Meets 94% minimum estimated energy needs and 90% minimum estimated protein needs. Pharmacy to advance TPN to goal rate tomorrow.  Height: Ht Readings from Last 1 Encounters:  06/29/13 5\' 3"  (1.6 m)    Weight: Wt Readings from Last 1 Encounters:  06/29/13 185 lb 3 oz (84 kg)    Ideal Body Weight: 52.3 kg  % Ideal Body Weight: 161%  Wt Readings from Last 10 Encounters:  06/29/13 185 lb 3 oz (84 kg)  06/20/13 185 lb 13.6 oz (84.3 kg)  05/20/13 188 lb (85.276  kg)  03/24/13 196 lb 9.6 oz (89.177 kg)  12/31/12 197 lb (89.359 kg)  12/05/12 203 lb 3.2 oz (92.171 kg)  10/09/12 207 lb (93.895 kg)  10/01/12 207 lb (93.895 kg)  08/30/12 205 lb (92.987 kg)  08/13/12 204 lb (92.534 kg)    Usual Body Weight: 188 lb  % Usual Body Weight: 98%  BMI:  Body mass index is 32.81 kg/(m^2).  Estimated Nutritional Needs: Kcal: 1600-1800 Protein: 80-95 gm Fluid: 1.8-2 L  Skin: no wounds  Diet Order: NPO  EDUCATION NEEDS: -Education not appropriate at this time   Intake/Output Summary (Last 24 hours) at 06/29/13 1055 Last data filed at 06/29/13 0700  Gross per 24 hour  Intake    200 ml  Output     60 ml  Net    140 ml    Last BM: PTA   Labs:   Recent Labs Lab 06/25/13 0500 06/26/13 1130 06/27/13 0555 07/09/2013 1230 06/29/13 0519  NA 147 139 137 140 142  K 4.1 3.7 3.4* 3.9 3.5*  CL 104 98 97 99 99  CO2 30 30 29 27  32  BUN 39* 43* 46* 43* 39*  CREATININE 2.10* 1.64* 1.85* 1.34* 1.33*  CALCIUM 8.2* 8.7 8.3* 8.3* 8.4  MG 1.9 1.7  --   --  1.7  PHOS 3.2 2.9 3.2  --  2.7  GLUCOSE 81 168* 210* 218* 250*    CBG (last 3)   Recent Labs  06/09/2013 2219  GLUCAP 157*    Scheduled  Meds: . albuterol  2.5 mg Nebulization BID  . bisoprolol  5 mg Oral Daily  . ertapenem  1 g Intravenous Q24H  . famotidine  20 mg Oral Daily  . guaiFENesin  400 mg Oral BID  . insulin aspart  0-20 Units Subcutaneous QID  . levothyroxine  50 mcg Oral QAC breakfast  . multivitamin with minerals  1 tablet Oral Daily  . potassium chloride SA  20 mEq Oral Daily  . potassium chloride  20 mEq Oral Q4H  . predniSONE  10 mg Oral Q breakfast  . sodium chloride  10-40 mL Intracatheter Q12H  . tiotropium  18 mcg Inhalation Daily  . torsemide  80 mg Oral Daily  . vancomycin  750 mg Intravenous Q24H    Continuous Infusions: . sodium chloride    . dextrose 60 mL/hr (06/29/13 0500)  . dextrose 5 % and 0.45 % NaCl with KCl 20 mEq/L 20 mL/hr (07/01/2013 2326)  .  Marland KitchenTPN (CLINIMIX-E) Adult     And  . fat emulsion      Past Medical History  Diagnosis Date  . Diabetes mellitus   . Obesity   . COPD (chronic obstructive pulmonary disease)     Moderate. PFTs (12/10): FVC 67%, FEV1 58%, ratio 60%, TLC 95%, RV 138%, DLCO 43% (moderate obstructive defect). She is on home oxygen that should be worn at all times.  . Diastolic CHF, chronic     Echo (3/12): EF 55-60%, mild LV hypertrophy, grade I diastolic dysfunction, mild left atrial enlargement, normal pulmonary artery pressure  . CKD (chronic kidney disease)     Cr 2.4 when last checked. 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 PNA (pneumonia)     h/o with ARDS in 2005; MRSA PNA with prolonged hospitalization in [facility] 11/11  . Diverticulum     Jejunal  . OSA (obstructive sleep apnea)     possible. When pt was sedated for TEE in May 2010, she did develop some upper airway obstruction  . Anemia     of chronic disease  . Hypothyroidism   . Gout   . Atrial fibrillation 07/2008    Failed cardioversion with TEE guidance in May 2010. Pt went back to NSR by 11/2008 with amiodarne  . Hypertension   . GERD (gastroesophageal reflux disease)   . Peripheral vascular disease   . Pneumonia   . Chronic kidney disease   . Respiratory failure, chronic 07/01/2011  . Chronic gout due to renal impairment 07/01/2011    Past Surgical History  Procedure Laterality Date  . Tracheostomy      ARDS/ ICU admission, trach 2005  . Microperforation  08/2008    Of jejunem, ICU admission    Joaquin Courts, RD, LDN, CNSC Pager [phone] After Hours Pager [phone]

## 2013-06-29 NOTE — Progress Notes (Signed)
Patient ID: [name], female   DOB: 12-26-1930, 80 y.o.   MRN: [id number]  Subjective: White count is down.  Renal function is stable.  CBGs high.  Objective:  Vital signs:  Filed Vitals:   06/29/13 0154 06/29/13 0617 06/29/13 0700 06/29/13 0859  BP: 129/64 122/64    Pulse: 80 78    Temp: 98 F (36.7 C) 97.2 F (36.2 C)    TempSrc: Oral Oral    Resp: 20 18    Height:   5' 3" (1.6 m)   Weight:   185 lb 3 oz (84 kg)   SpO2: 98% 97%  97%       Intake/Output   Yesterday:  03/20 0701 - 03/21 0700 In: 200 [IV Piggyback:200] Out: 60 [Drains:60] This shift:    I/O last 3 completed shifts: In: 200 [IV Piggyback:200] Out: 51 [Drains:60]   Physical Exam: General: Pt awake/alert/oriented x3 in no acute distress Chest: coarse CV:  Irregularly irregular Abdomen: Soft.  Nondistended. Mild ttp umbilical region and llq.  No evidence of peritonitis. Small umbilical hernia. Ext:  SCDs BLE.  No mjr edema.  No cyanosis   Problem List:   Active Problems:   Colonic diverticular abscess    Results:   Labs: Results for orders placed during the hospital encounter of 06/09/2013 (from the past 48 hour(s))  GLUCOSE, CAPILLARY     Status: Abnormal   Collection Time    06/26/2013 10:19 PM      Result Value Ref Range   Glucose-Capillary 157 (*) 70 - 99 mg/dL   Comment 1 Notify RN    COMPREHENSIVE METABOLIC PANEL     Status: Abnormal   Collection Time    06/29/13  5:19 AM      Result Value Ref Range   Sodium 142  137 - 147 mEq/L   Potassium 3.5 (*) 3.7 - 5.3 mEq/L   Chloride 99  96 - 112 mEq/L   CO2 32  19 - 32 mEq/L   Glucose, Bld 250 (*) 70 - 99 mg/dL   BUN 39 (*) 6 - 23 mg/dL   Creatinine, Ser 1.33 (*) 0.50 - 1.10 mg/dL   Calcium 8.4  8.4 - 10.5 mg/dL   Total Protein 6.0  6.0 - 8.3 g/dL   Albumin 1.7 (*) 3.5 - 5.2 g/dL   AST 9  0 - 37 U/L   ALT 9  0 - 35 U/L   Alkaline Phosphatase 81  39 - 117 U/L   Total Bilirubin 0.3  0.3 - 1.2 mg/dL   GFR calc non Af Amer 36 (*)  >90 mL/min   GFR calc Af Amer 42 (*) >90 mL/min   Comment: (NOTE)     The eGFR has been calculated using the CKD EPI equation.     This calculation has not been validated in all clinical situations.     eGFR's persistently <90 mL/min signify possible Chronic Kidney     Disease.  MAGNESIUM     Status: None   Collection Time    06/29/13  5:19 AM      Result Value Ref Range   Magnesium 1.7  1.5 - 2.5 mg/dL  PHOSPHORUS     Status: None   Collection Time    06/29/13  5:19 AM      Result Value Ref Range   Phosphorus 2.7  2.3 - 4.6 mg/dL  CBC     Status: Abnormal   Collection Time    06/29/13  5:19 AM      Result Value Ref Range   WBC 9.7  4.0 - 10.5 K/uL   RBC 2.68 (*) 3.87 - 5.11 MIL/uL   Hemoglobin 8.0 (*) 12.0 - 15.0 g/dL   HCT 24.9 (*) 36.0 - 46.0 %   MCV 92.9  78.0 - 100.0 fL   MCH 29.9  26.0 - 34.0 pg   MCHC 32.1  30.0 - 36.0 g/dL   RDW 23.1 (*) 11.5 - 15.5 %   Platelets 281  150 - 400 K/uL  DIFFERENTIAL     Status: Abnormal   Collection Time    06/29/13  5:19 AM      Result Value Ref Range   Neutrophils Relative % 72  43 - 77 %   Lymphocytes Relative 14  12 - 46 %   Monocytes Relative 11  3 - 12 %   Eosinophils Relative 2  0 - 5 %   Basophils Relative 1  0 - 1 %   Neutro Abs 6.9  1.7 - 7.7 K/uL   Lymphs Abs 1.4  0.7 - 4.0 K/uL   Monocytes Absolute 1.1 (*) 0.1 - 1.0 K/uL   Eosinophils Absolute 0.2  0.0 - 0.7 K/uL   Basophils Absolute 0.1  0.0 - 0.1 K/uL   RBC Morphology POLYCHROMASIA PRESENT     Comment: RARE NRBCs     SPHEROCYTES   WBC Morphology ATYPICAL LYMPHOCYTES    PROTIME-INR     Status: Abnormal   Collection Time    06/29/13  5:19 AM      Result Value Ref Range   Prothrombin Time 29.2 (*) 11.6 - 15.2 seconds   INR 2.89 (*) 0.00 - 1.49  TRIGLYCERIDES     Status: Abnormal   Collection Time    06/29/13  5:19 AM      Result Value Ref Range   Triglycerides 284 (*) <150 mg/dL    Imaging / Studies: Ct Abdomen Pelvis Wo Contrast  06/27/2013   CLINICAL  DATA:  Psoas abscess post drainage, cellulitis  EXAM: CT ABDOMEN AND PELVIS WITHOUT CONTRAST  TECHNIQUE: Multidetector CT imaging of the abdomen and pelvis was performed following the standard protocol without intravenous contrast. Sagittal and coronal MPR images reconstructed from axial data set. Neither oral nor intravenous contrast administered.  COMPARISON:  06/24/2013, 06/21/2013, 06/07/2013  FINDINGS: Pigtail drainage catheter again identified within abscess collection in the left pelvis involving the left iliopsoas and adjacent sigmoid mesial colon.  Catheter is positioned predominately within the left adnexa/sigmoid mesocolon compartment.  Persistent 4.0 x 3.3 x 4.7 cm diameter collection within left iliopsoas, increased in size.  Small 3.1 x2.6 cm diameter phlegmon versus additional residual abscess at the sigmoid mesocolon.  Numerous sigmoid diverticula noted.  An additional 5.1 x 3.6 x 2.8 cm diameter collection is seen immediately adjacent to left lateral aspect of the urinary bladder, inferior to the uterus, corresponding to the site of extravasated GI contrast on the previous exam, slightly increased in size from the previous dimensions of 3.3 x 2.3 cm.  Air within urinary bladder.  Stool remaining bowel loops unremarkable.  No free intraperitoneal fluid.  Atrophic uterus  IMPRESSION: Persistent abscess collections involving the left iliopsoas and sigmoid mesial colon, slightly larger than on previous exam.   Electronically Signed   By: Lavonia Dana M.D.   On: 06/27/2013 13:58    Medications / Allergies: per chart  Antibiotics: Anti-infectives   Start     Dose/Rate Route Frequency Ordered Stop  06/30/2013 2300  ertapenem (INVANZ) 1 g in sodium chloride 0.9 % 50 mL IVPB     1 g 100 mL/hr over 30 Minutes Intravenous Every 24 hours 06/16/2013 2142     07/05/2013 2200  vancomycin (VANCOCIN) IVPB 750 mg/150 ml premix     750 mg 150 mL/hr over 60 Minutes Intravenous Every 24 hours 06/17/2013 2104         Assessment/Plan Diverticulitis with abscess and colocutaneous fistula. IR notified to attempt to reposition the drain---per IR would not recommend reposition TNA NPO atbx changed to [facility] 3/20 Vanc for cellulitis surrounding the drain Hold coumadin, heparin when INR <2 Consult to IM for help with multiple medical problems Cardiology consult for cardiac clearance Probable surgery early next week Hypokalemia-supplement  Repeat INR in AM Add insulin per SSI, monitor for hypoglycemia, to be started when TPN is resumed  Multiple medical problems DM  COPD, home 02 Chronic diastolic dysfunction CKD OSA hypothyroidism  Erby Pian, [facility] Pager [phone] Office [phone]  06/29/2013 9:14 AM

## 2013-06-29 NOTE — Progress Notes (Signed)
PA on call made aware of output noted upon inserting foley cath. No new order noted. Emotional support given to family. Will continue to monitor output.

## 2013-06-29 NOTE — Progress Notes (Signed)
ANTICOAGULATION CONSULT NOTE - Follow Up Consult  Pharmacy Consult for heparin Indication: atrial fibrillation  Allergies  Allergen Reactions  . Sulfonamide Derivatives Other (See Comments)    Childhood reaction    Patient Measurements: Height: 5\' 3"  (160 cm) Weight: 185 lb 3 oz (84 kg) IBW/kg (Calculated) : 52.4 Heparin Dosing Weight: 71kg  Vital Signs: Temp: 97.2 F (36.2 C) (03/21 0617) Temp src: Oral (03/21 0617) BP: 122/64 mmHg (03/21 0617) Pulse Rate: 78 (03/21 0617)  Labs:  Recent Labs  06/27/13 0555 06/30/2013 1230 06/29/13 0519  HGB 8.5* 8.1* 8.0*  HCT 25.5* 24.4* 24.9*  PLT 289 266 281  LABPROT 28.1* 24.7* 29.2*  INR 2.74* 2.32* 2.89*  CREATININE 1.85* 1.34* 1.33*    Estimated Creatinine Clearance: 34 ml/min (by C-G formula based on Cr of 1.33).   Assessment: [age] [facility] d/t progressive pain and weakness relative to her fistula and drain change. On warfarin for AFib which has been held d/t possible surgery. She is to start heparin when INR is <2. This morning INR is 2.89. Hgb is low but stable. plts WNL. No bleeding noted.  Goal of Therapy:  INR <2 Heparin level 0.3-0.7 units/ml Monitor platelets by anticoagulation protocol: Yes   Plan:  1. Continue to hold anticoagulation with elevated INR 2. Daily INR 3. Will start heparin when INR <2  Satcha Storlie D. Jordi Lacko, PharmD, BCPS Clinical Pharmacist Pager: [phone] 06/29/2013 10:47 AM

## 2013-06-29 NOTE — Progress Notes (Signed)
PARENTERAL NUTRITION CONSULT NOTE - INITIAL  Pharmacy Consult for TPN Indication: colocutaneous fistula  Allergies  Allergen Reactions  . Sulfonamide Derivatives Other (See Comments)    Childhood reaction    Patient Measurements: Height: 5\' 3"  (160 cm) Weight: 185 lb 3 oz (84 kg) IBW/kg (Calculated) : 52.4 Adjusted Body Weight: 65kg  Vital Signs: Temp: 97.2 F (36.2 C) (03/21 0617) Temp src: Oral (03/21 0617) BP: 122/64 mmHg (03/21 0617) Pulse Rate: 78 (03/21 0617) Intake/Output from previous day: 03/20 0701 - 03/21 0700 In: 200 [IV Piggyback:200] Out: 60 [Drains:60] Intake/Output from this shift:    Labs:  Recent Labs  06/27/13 0555 07/02/2013 1230 06/29/13 0519  WBC 16.3* 12.3* 9.7  HGB 8.5* 8.1* 8.0*  HCT 25.5* 24.4* 24.9*  PLT 289 266 281  INR 2.74* 2.32* 2.89*     Recent Labs  06/26/13 1130 06/27/13 0555 07/05/2013 1230 06/29/13 0519  NA 139 137 140 142  K 3.7 3.4* 3.9 3.5*  CL 98 97 99 99  CO2 30 29 27  32  GLUCOSE 168* 210* 218* 250*  BUN 43* 46* 43* 39*  CREATININE 1.64* 1.85* 1.34* 1.33*  CALCIUM 8.7 8.3* 8.3* 8.4  MG 1.7  --   --  1.7  PHOS 2.9 3.2  --  2.7  PROT 5.8*  --   --  6.0  ALBUMIN 1.9*  --   --  1.7*  AST 14  --   --  9  ALT 13  --   --  9  ALKPHOS 94  --   --  81  BILITOT 0.7  --   --  0.3  PREALBUMIN 13.2*  --   --   --   TRIG 274*  --   --  284*   Estimated Creatinine Clearance: 34 ml/min (by C-G formula based on Cr of 1.33).    Recent Labs  06/23/2013 2219  GLUCAP 157*    Medical History: Past Medical History  Diagnosis Date  . Diabetes mellitus   . Obesity   . COPD (chronic obstructive pulmonary disease)     Moderate. PFTs (12/10): FVC 67%, FEV1 58%, ratio 60%, TLC 95%, RV 138%, DLCO 43% (moderate obstructive defect). She is on home oxygen that should be worn at all times.  . Diastolic CHF, chronic     Echo (3/12): EF 55-60%, mild LV hypertrophy, grade I diastolic dysfunction, mild left atrial enlargement, normal  pulmonary artery pressure  . CKD (chronic kidney disease)     Cr 2.4 when last checked. 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 PNA (pneumonia)     h/o with ARDS in 2005; MRSA PNA with prolonged hospitalization in [facility] 11/11  . Diverticulum     Jejunal  . OSA (obstructive sleep apnea)     possible. When pt was sedated for TEE in May 2010, she did develop some upper airway obstruction  . Anemia     of chronic disease  . Hypothyroidism   . Gout   . Atrial fibrillation 07/2008    Failed cardioversion with TEE guidance in May 2010. Pt went back to NSR by 11/2008 with amiodarne  . Hypertension   . GERD (gastroesophageal reflux disease)   . Peripheral vascular disease   . Pneumonia   . Chronic kidney disease   . Respiratory failure, chronic 07/01/2011  . Chronic gout due to renal impairment 07/01/2011    Insulin Requirements in the past 24 hours:  No insulin ordered  Current  Nutrition:  D10 @ 7660mL/hr At [facility], was on Clinimix 4.25/25 at 4760mL/hr with NO lipids  Nutritional Goals:  1600-1800 kCal, 80-95 grams of protein per day per RD recs on 3/12 before transfer to [facility]  Admit:  83 YOF recently hospitaliazed for PNA and flu and developed diverticulitis with abscess which required drain placement. This week her drain was attempted to be upsized d/t stool in abscess cavity and she continued to have worsening pain and weakness.   GI: has been NPO except for medications; IR to see if drain can be repositioned- if not she will likely need surgery. Drain with 60mL out that has been charted; PO Pepcid, PO multivitamin. Prealbumin was 13.2 on 3/18- repeat pending  Endo: hx hypothyroid, TSH 06/22/2013 1.9, continues on Synthroid 50; hx DM- 06/22/2013 A1C 7%. CBGs 157-250 last 24 hours, is also on prednisone  Lytes: K 3.5- she is on KCl 20mEq PO daily, Phos 2.7, Mag 1.7; other lytes WNL  Renal: hx CKD; SCr 1.33 with est CrCL ~8235mL/min  Pulm: 95/2L  [place]  Cards: VSS on bisoprolol, torsemide and scheduled KCl 20mEq daily  Hepatobil: LFTs wnl except albumin is low at 1.7. Baseline trigs elevated at 284.  Neuro:  A&O  ID: was on Flagyl and tygecycline at [facility], changed to ertapenem and vancomycin here- D#2 for both of those. WBc 9.7, Tmax/24h 99.4.  Best Practices: to switch to heparin when INR <2 (currently 2.89)  TPN Access: R PICC TPN day#: 4 (started 3/17 in [facility])  Plan:  1. Clinimix E 5/15 at 3260mL/hr + lipid 20% emulsion at 3210mL/hr- this will provide 72g protein and 1584kcal daily while also preventing fatty acid deficiency. Goal rate will be 1775mL/hr + daily lipids which will provide 90g protein and 1872kcal daily. 2. NO multivitamin in TPN as patient is receiving daily as PO- will reassess if this changes 3. Trace elements MWF d/t national backorder 4. [facility] 5. CBGs and resistant SSI 4 times daily 6. BMET, mag and phos tomorrow morning 7. Give 2 more doses of KCl 20mEq PO today for repletion 8. Follow up RD recommendations- appreciate their assistance  Britain Anagnos D. Suriya Kovarik, PharmD, BCPS Clinical Pharmacist Pager: [phone] 06/29/2013 9:59 AM

## 2013-06-30 DIAGNOSIS — E876 Hypokalemia: Secondary | ICD-10-CM

## 2013-06-30 DIAGNOSIS — I4891 Unspecified atrial fibrillation: Secondary | ICD-10-CM

## 2013-06-30 DIAGNOSIS — J438 Other emphysema: Secondary | ICD-10-CM

## 2013-06-30 LAB — URINALYSIS, ROUTINE W REFLEX MICROSCOPIC
Bilirubin Urine: NEGATIVE
GLUCOSE, UA: NEGATIVE mg/dL
Ketones, ur: NEGATIVE mg/dL
Nitrite: NEGATIVE
PROTEIN: NEGATIVE mg/dL
SPECIFIC GRAVITY, URINE: 1.009 (ref 1.005–1.030)
Urobilinogen, UA: 0.2 mg/dL (ref 0.0–1.0)
pH: 6 (ref 5.0–8.0)

## 2013-06-30 LAB — PHOSPHORUS: PHOSPHORUS: 2.7 mg/dL (ref 2.3–4.6)

## 2013-06-30 LAB — GLUCOSE, CAPILLARY
Glucose-Capillary: 173 mg/dL — ABNORMAL HIGH (ref 70–99)
Glucose-Capillary: 273 mg/dL — ABNORMAL HIGH (ref 70–99)
Glucose-Capillary: 289 mg/dL — ABNORMAL HIGH (ref 70–99)
Glucose-Capillary: 197 mg/dL — ABNORMAL HIGH (ref 70–99)

## 2013-06-30 LAB — URINE MICROSCOPIC-ADD ON

## 2013-06-30 LAB — PROTIME-INR
INR: 2.95 — AB (ref 0.00–1.49)
PROTHROMBIN TIME: 29.7 s — AB (ref 11.6–15.2)

## 2013-06-30 LAB — BASIC METABOLIC PANEL
BUN: 37 mg/dL — ABNORMAL HIGH (ref 6–23)
CHLORIDE: 96 meq/L (ref 96–112)
CO2: 31 meq/L (ref 19–32)
Calcium: 8.6 mg/dL (ref 8.4–10.5)
Creatinine, Ser: 1.21 mg/dL — ABNORMAL HIGH (ref 0.50–1.10)
GFR calc Af Amer: 47 mL/min — ABNORMAL LOW (ref >90)
GFR calc non Af Amer: 41 mL/min — ABNORMAL LOW (ref >90)
Glucose, Bld: 152 mg/dL — ABNORMAL HIGH (ref 70–99)
POTASSIUM: 4.6 meq/L (ref 3.7–5.3)
SODIUM: 137 meq/L (ref 137–147)

## 2013-06-30 LAB — MAGNESIUM: MAGNESIUM: 1.6 mg/dL (ref 1.5–2.5)

## 2013-06-30 MED ORDER — VITAMIN K1 10 MG/ML IJ SOLN
5.0000 mg | Freq: Once | INTRAVENOUS | Status: AC
  Administered 2013-06-30: 5 mg via INTRAVENOUS
  Filled 2013-06-30: qty 0.5

## 2013-06-30 MED ORDER — MAGNESIUM SULFATE 40 MG/ML IJ SOLN
2.0000 g | Freq: Once | INTRAMUSCULAR | Status: AC
  Administered 2013-06-30: 2 g via INTRAVENOUS
  Filled 2013-06-30: qty 50

## 2013-06-30 MED ORDER — ALBUTEROL SULFATE (2.5 MG/3ML) 0.083% IN NEBU
2.5000 mg | INHALATION_SOLUTION | Freq: Four times a day (QID) | RESPIRATORY_TRACT | Status: DC
  Administered 2013-06-30 – 2013-07-01 (×6): 2.5 mg via RESPIRATORY_TRACT
  Filled 2013-06-30 (×6): qty 3

## 2013-06-30 MED ORDER — CLINIMIX E/DEXTROSE (5/15) 5 % IV SOLN
INTRAVENOUS | Status: AC
  Administered 2013-06-30: 19:00:00 via INTRAVENOUS
  Filled 2013-06-30: qty 2000

## 2013-06-30 MED ORDER — FAT EMULSION 20 % IV EMUL
240.0000 mL | INTRAVENOUS | Status: AC
  Administered 2013-06-30: 240 mL via INTRAVENOUS
  Filled 2013-06-30: qty 250

## 2013-06-30 MED ORDER — FUROSEMIDE 10 MG/ML IJ SOLN
20.0000 mg | Freq: Once | INTRAMUSCULAR | Status: AC
  Administered 2013-06-30: 20 mg via INTRAVENOUS
  Filled 2013-06-30: qty 2

## 2013-06-30 NOTE — Progress Notes (Signed)
Patient ID: [name], female   DOB: 1930-10-18, 80 y.o.   MRN: [id number]  Subjective: Foley inserted, fecal matter noted.  No n/v.  sCr improving.  VSS.  Afebrile.   Objective:  Vital signs:  Filed Vitals:   06/29/13 0948 06/29/13 1924 06/29/13 2151 06/30/13 0538  BP:  123/57 120/62 124/72  Pulse:  70 78 72  Temp:  98.4 F (36.9 C) 98.1 F (36.7 C)   TempSrc:  Oral Oral Oral  Resp:  '18 18 18  ' Height:      Weight:      SpO2: 96% 99% 97% 96%       Intake/Output   Yesterday:  03/21 0701 - 03/22 0700 In: 952.3 [I.V.:942.3] Out: 1160 [Drains:1160] This shift:     Physical Exam:  General: Pt awake/alert/oriented x3 in no acute distress  Chest: coarse  CV: Irregularly irregular  Abdomen: Soft. Nondistended. Mild ttp umbilical region and llq. No evidence of peritonitis. Small umbilical hernia. Drain with stool.  Foley with fecal matter. Ext: SCDs BLE. No mjr edema. No cyanosis    Problem List:   Active Problems:   Colonic diverticular abscess    Results:   Labs: Results for orders placed during the hospital encounter of 06/16/2013 (from the past 48 hour(s))  GLUCOSE, CAPILLARY     Status: Abnormal   Collection Time    06/11/2013 10:19 PM      Result Value Ref Range   Glucose-Capillary 157 (*) 70 - 99 mg/dL   Comment 1 Notify RN    COMPREHENSIVE METABOLIC PANEL     Status: Abnormal   Collection Time    06/29/13  5:19 AM      Result Value Ref Range   Sodium 142  137 - 147 mEq/L   Potassium 3.5 (*) 3.7 - 5.3 mEq/L   Chloride 99  96 - 112 mEq/L   CO2 32  19 - 32 mEq/L   Glucose, Bld 250 (*) 70 - 99 mg/dL   BUN 39 (*) 6 - 23 mg/dL   Creatinine, Ser 1.33 (*) 0.50 - 1.10 mg/dL   Calcium 8.4  8.4 - 10.5 mg/dL   Total Protein 6.0  6.0 - 8.3 g/dL   Albumin 1.7 (*) 3.5 - 5.2 g/dL   AST 9  0 - 37 U/L   ALT 9  0 - 35 U/L   Alkaline Phosphatase 81  39 - 117 U/L   Total Bilirubin 0.3  0.3 - 1.2 mg/dL   GFR calc non Af Amer 36 (*) >90 mL/min   GFR calc Af  Amer 42 (*) >90 mL/min   Comment: (NOTE)     The eGFR has been calculated using the CKD EPI equation.     This calculation has not been validated in all clinical situations.     eGFR's persistently <90 mL/min signify possible Chronic Kidney     Disease.  PREALBUMIN     Status: Abnormal   Collection Time    06/29/13  5:19 AM      Result Value Ref Range   Prealbumin 10.5 (*) 17.0 - 34.0 mg/dL   Comment: Performed at [facility]     Status: None   Collection Time    06/29/13  5:19 AM      Result Value Ref Range   Magnesium 1.7  1.5 - 2.5 mg/dL  PHOSPHORUS     Status: None   Collection Time    06/29/13  5:19 AM  Result Value Ref Range   Phosphorus 2.7  2.3 - 4.6 mg/dL  CBC     Status: Abnormal   Collection Time    06/29/13  5:19 AM      Result Value Ref Range   WBC 9.7  4.0 - 10.5 K/uL   RBC 2.68 (*) 3.87 - 5.11 MIL/uL   Hemoglobin 8.0 (*) 12.0 - 15.0 g/dL   HCT 24.9 (*) 36.0 - 46.0 %   MCV 92.9  78.0 - 100.0 fL   MCH 29.9  26.0 - 34.0 pg   MCHC 32.1  30.0 - 36.0 g/dL   RDW 23.1 (*) 11.5 - 15.5 %   Platelets 281  150 - 400 K/uL  DIFFERENTIAL     Status: Abnormal   Collection Time    06/29/13  5:19 AM      Result Value Ref Range   Neutrophils Relative % 72  43 - 77 %   Lymphocytes Relative 14  12 - 46 %   Monocytes Relative 11  3 - 12 %   Eosinophils Relative 2  0 - 5 %   Basophils Relative 1  0 - 1 %   Neutro Abs 6.9  1.7 - 7.7 K/uL   Lymphs Abs 1.4  0.7 - 4.0 K/uL   Monocytes Absolute 1.1 (*) 0.1 - 1.0 K/uL   Eosinophils Absolute 0.2  0.0 - 0.7 K/uL   Basophils Absolute 0.1  0.0 - 0.1 K/uL   RBC Morphology POLYCHROMASIA PRESENT     Comment: RARE NRBCs     SPHEROCYTES   WBC Morphology ATYPICAL LYMPHOCYTES    PROTIME-INR     Status: Abnormal   Collection Time    06/29/13  5:19 AM      Result Value Ref Range   Prothrombin Time 29.2 (*) 11.6 - 15.2 seconds   INR 2.89 (*) 0.00 - 1.49  TRIGLYCERIDES     Status: Abnormal   Collection Time     06/29/13  5:19 AM      Result Value Ref Range   Triglycerides 284 (*) <150 mg/dL  GLUCOSE, CAPILLARY     Status: Abnormal   Collection Time    06/29/13  7:45 AM      Result Value Ref Range   Glucose-Capillary 218 (*) 70 - 99 mg/dL   Comment 1 Notify RN    GLUCOSE, CAPILLARY     Status: Abnormal   Collection Time    06/29/13 12:52 PM      Result Value Ref Range   Glucose-Capillary 247 (*) 70 - 99 mg/dL   Comment 1 Notify RN    GLUCOSE, CAPILLARY     Status: Abnormal   Collection Time    06/29/13  5:45 PM      Result Value Ref Range   Glucose-Capillary 202 (*) 70 - 99 mg/dL   Comment 1 Notify RN    GLUCOSE, CAPILLARY     Status: Abnormal   Collection Time    06/29/13  9:49 PM      Result Value Ref Range   Glucose-Capillary 149 (*) 70 - 99 mg/dL  PROTIME-INR     Status: Abnormal   Collection Time    06/30/13  5:00 AM      Result Value Ref Range   Prothrombin Time 29.7 (*) 11.6 - 15.2 seconds   INR 2.95 (*) 0.00 - 9.37  BASIC METABOLIC PANEL     Status: Abnormal   Collection Time    06/30/13  5:00  AM      Result Value Ref Range   Sodium 137  137 - 147 mEq/L   Potassium 4.6  3.7 - 5.3 mEq/L   Comment: DELTA CHECK NOTED     NO VISIBLE HEMOLYSIS   Chloride 96  96 - 112 mEq/L   CO2 31  19 - 32 mEq/L   Glucose, Bld 152 (*) 70 - 99 mg/dL   BUN 37 (*) 6 - 23 mg/dL   Creatinine, Ser 1.21 (*) 0.50 - 1.10 mg/dL   Calcium 8.6  8.4 - 10.5 mg/dL   GFR calc non Af Amer 41 (*) >90 mL/min   GFR calc Af Amer 47 (*) >90 mL/min   Comment: (NOTE)     The eGFR has been calculated using the CKD EPI equation.     This calculation has not been validated in all clinical situations.     eGFR's persistently <90 mL/min signify possible Chronic Kidney     Disease.  MAGNESIUM     Status: None   Collection Time    06/30/13  5:00 AM      Result Value Ref Range   Magnesium 1.6  1.5 - 2.5 mg/dL  PHOSPHORUS     Status: None   Collection Time    06/30/13  5:00 AM      Result Value Ref Range    Phosphorus 2.7  2.3 - 4.6 mg/dL  GLUCOSE, CAPILLARY     Status: Abnormal   Collection Time    06/30/13  7:57 AM      Result Value Ref Range   Glucose-Capillary 173 (*) 70 - 99 mg/dL   Comment 1 Notify RN      Imaging / Studies: No results found.  Medications / Allergies: per chart  Antibiotics: Anti-infectives   Start     Dose/Rate Route Frequency Ordered Stop   06/27/2013 2300  ertapenem (INVANZ) 1 g in sodium chloride 0.9 % 50 mL IVPB     1 g 100 mL/hr over 30 Minutes Intravenous Every 24 hours 06/18/2013 2142     06/12/2013 2200  vancomycin (VANCOCIN) IVPB 750 mg/150 ml premix     750 mg 150 mL/hr over 60 Minutes Intravenous Every 24 hours 06/30/2013 2104        Assessment/Plan  Diverticulitis with abscess and colocutaneous and colovesical fistula.  She needs surgical intervention, possibly tomorrow if INR  IR notified to attempt to reposition the drain---per IR would not recommend reposition  TNA  NPO  atbx changed to Invanz 3/20  Vanc for cellulitis surrounding the drain  Hold coumadin, heparin when INR <2.  Reverse INR(2.9 today).  Give 2 units of FFP and vitamin K 57m, lasix post transfusion.  Repeat labs in AM We requested cardiology clearance.  I spoke with Dr. BJeffie Pollockwho stated that the recommendations would not change from her previous evaluation in February and re-consult post operatively if needed.  UA and urine culture pending Hypokalemia-supplement PRN  DM-TPN, CBGs and SSI Atrial fibrillation-continue to hold coumadin COPD, home 02-CCM has been consulted  Chronic diastolic dysfunction-give lasix 229mpost transfusion.  Monitor for volume overload.  CKD -baseline OSA -CPAP Hypothyroidism-home meds Appreciate IM assistance with chronic medical problems  EmErby PianANThe Pavilion At [facility] Placeurgery Pager [phone]/22/2015 10:40 AM

## 2013-06-30 NOTE — Consult Note (Signed)
Name: Heidi HanlonCynthia A Obando MRN: [id number] DOB: 10/06/1930    ADMISSION DATE:  06/17/2013 CONSULTATION DATE:  3/22  REFERRING MD :  Derrell LollingIngram  PRIMARY SERVICE:  CCS  CHIEF COMPLAINT:  Diverticulitis w/ abscess, h/o chronic resp failure/ request for pre-op pulm eval  BRIEF PATIENT DESCRIPTION:   80 year old female admitted on 3/20 for Diverticular abscess. Surgery planning on Laparotomy, [name] rsxn and colostomy for 3/23. PCCM asked to see pre-op in setting of GOLD stage C COPD and prob element of OHS.   SIGNIFICANT EVENTS / STUDIES:  CT abd 3/19: Persistent abscess collections involving the left iliopsoas and sigmoid mesial colon, slightly larger than on previous exam.   LINES / TUBES:   CULTURES:   ANTIBIOTICS:   HISTORY OF PRESENT ILLNESS:   80 year old female who was recently the hospital with pneumonia and the flu who subsequently developed diverticulitis with diverticular abscess. A drain was placed. There was stool leaking from the abscess cavity. She has had her drain attempted to be upsized and continued to have worsening pain over the week of 3/20. She has continued to become weaker and unable to get out of bed. She had significant pain and cellulitis of the drain insertion site. Denied fevers and chills. She has also been on antibiotics throughout this time. She's been on TNA and has been n.p.o. other than her pills. Was admitted on 3/20 w/ hope that her drain might be able to be re-positioned by IR. After CT review it was felt that repositioning was not indicated, however either surgery OR placement of second drain if surgery was not a option. Based on this it was felt that the best option was to go ahead w/ surgery. She is planned for  Laparotomy, [name] rsxn and colostomy for 3/23. PCCM asked to see pre-op in setting of GOLD stage C COPD and prob element of OHS.     PAST MEDICAL HISTORY :  Past Medical History  Diagnosis Date  . Diabetes mellitus   . Obesity   . COPD  (chronic obstructive pulmonary disease)     Moderate. PFTs (12/10): FVC 67%, FEV1 58%, ratio 60%, TLC 95%, RV 138%, DLCO 43% (moderate obstructive defect). She is on home oxygen that should be worn at all times.  . Diastolic CHF, chronic     Echo (3/12): EF 55-60%, mild LV hypertrophy, grade I diastolic dysfunction, mild left atrial enlargement, normal pulmonary artery pressure  . CKD (chronic kidney disease)     Cr 2.4 when last checked. ANCA +, pauci-immune glomerulnephritis followed by Dr. Arrie Aranoladonato  . PNA (pneumonia)     h/o with ARDS in 2005; [facility]  . Diverticulum     Jejunal  . OSA (obstructive sleep apnea)     possible. When pt was sedated for TEE in May 2010, she did develop some upper airway obstruction  . Anemia     of chronic disease  . Hypothyroidism   . Gout   . Atrial fibrillation 07/2008    Failed cardioversion with TEE guidance in May 2010. Pt went back to NSR by 11/2008 with amiodarne  . Hypertension   . GERD (gastroesophageal reflux disease)   . Peripheral vascular disease   . Pneumonia   . Chronic kidney disease   . Respiratory failure, chronic 07/01/2011  . Chronic gout due to renal impairment 07/01/2011   Past Surgical History  Procedure Laterality Date  . Tracheostomy      ARDS/  ICU admission, trach 2005  . Microperforation  08/2008    Of jejunem, ICU admission   Prior to Admission medications   Medication Sig Start Date End Date Taking? Authorizing Provider  Aclidinium Bromide (TUDORZA PRESSAIR) 400 MCG/ACT AEPB Inhale 1 puff into the lungs 2 (two) times daily.   Yes Historical Provider, MD  albuterol (PROVENTIL) (2.5 MG/3ML) 0.083% nebulizer solution Take 3 mLs (2.5 mg total) by nebulization 2 (two) times daily. Dx: 492.8 COPD with Emphysema 04/15/13  Yes Storm FriskPatrick E Jaiyon Wander, MD  Amino Acids-Protein Hydrolys (FEEDING SUPPLEMENT, PRO-STAT SUGAR FREE 64,) LIQD Take 30 mLs by mouth 2 (two) times daily.    Yes Historical Provider, MD  bisoprolol (ZEBETA) 10 MG tablet Take 5 mg by mouth daily.   Yes Historical Provider, MD  budesonide (PULMICORT) 0.5 MG/2ML nebulizer solution Take 0.5 mg by nebulization 2 (two) times daily.   Yes Historical Provider, MD  famotidine (PEPCID) 20 MG tablet Take 20 mg by mouth daily.   Yes Historical Provider, MD  guaiFENesin (MUCINEX) 600 MG 12 hr tablet Take 600 mg by mouth 2 (two) times daily.   Yes Historical Provider, MD  insulin aspart (NOVOLOG) 100 UNIT/ML injection Inject into the skin every 6 (six) hours. Sliding scale   Yes Historical Provider, MD  levothyroxine (SYNTHROID, LEVOTHROID) 50 MCG tablet Take 50 mcg by mouth daily.   Yes Historical Provider, MD  metroNIDAZOLE (FLAGYL) 5-0.79 MG/ML-% IVPB Inject 500 mg into the vein every 6 (six) hours.   Yes Historical Provider, MD  Multiple Vitamins-Minerals (MULTIVITAMINS THER. W/MINERALS) TABS tablet Take 1 tablet by mouth daily.   Yes Historical Provider, MD  oxyCODONE (OXY IR/ROXICODONE) 5 MG immediate release tablet Take 5 mg by mouth every 12 (twelve) hours.   Yes Historical Provider, MD  Probiotic Product (RISA-BID PROBIOTIC PO) Take 1 capsule by mouth 2 (two) times daily.   Yes Historical Provider, MD  tigecycline (TYGACIL) 50 MG injection Inject 50 mg into the vein 2 (two) times daily.   Yes Historical Provider, MD  torsemide (DEMADEX) 20 MG tablet Take 80 mg by mouth daily.   Yes Historical Provider, MD  barrier cream (NON-SPECIFIED) CREA Apply 1 application topically 3 (three) times daily as needed (place around drain area to keep it dry and clean). 06/20/13   Vassie Lollarlos Madera, MD   Allergies  Allergen Reactions  . Sulfonamide Derivatives Other (See Comments)    Childhood reaction    FAMILY HISTORY:  Family History  Problem Relation Age of Onset  . Emphysema Mother     and asthma  . Asthma Mother   . Heart disease Neg Hx     premature   SOCIAL HISTORY:  reports that she quit smoking about 37  years ago. Her smoking use included Cigarettes. She has a 23 pack-year smoking history. She has never used smokeless tobacco. She reports that she drinks alcohol. She reports that she does not use illicit drugs.  REVIEW OF SYSTEMS:   Constitutional: Negative for fever, chills, weight loss, malaise/fatigue and diaphoresis.  HENT: Negative for hearing loss, ear pain, nosebleeds, congestion, sore throat, neck pain, tinnitus and ear discharge.   Eyes: Negative for blurred vision, double vision, photophobia, pain, discharge and redness.  Respiratory: Negative for cough, hemoptysis, sputum production,only recently, but no color, no shortness of breath, wheezing and stridor.   Cardiovascular: Negative for chest pain, palpitations, orthopnea, claudication, leg swelling and PND.  Gastrointestinal: Negative for heartburn, nausea, vomiting, abdominal pain, diarrhea, constipation, blood in stool and melena.  Genitourinary: Negative for dysuria, urgency, frequency, hematuria and flank pain.  Musculoskeletal: Negative for myalgias, back pain, joint pain and falls.  Skin: Negative for itching and rash.  Neurological: Negative for dizziness, tingling, tremors, sensory change, speech change, focal weakness, seizures, loss of consciousness, weakness and headaches.  Endo/Heme/Allergies: Negative for environmental allergies and polydipsia. Does not bruise/bleed easily.  SUBJECTIVE:   VITAL SIGNS: Temp:  [98.1 F (36.7 C)-98.4 F (36.9 C)] 98.1 F (36.7 C) (03/21 2151) Pulse Rate:  [70-78] 72 (03/22 0538) Resp:  [18] 18 (03/22 0538) BP: (120-124)/(57-72) 124/72 mmHg (03/22 0538) SpO2:  [96 %-99 %] 96 % (03/22 1146) FiO2 (%):  [28 %] 28 % (03/22 1146)  PHYSICAL EXAMINATION: General:  No distress Neuro:  Awake and alert HEENT:  No JVD Neck:  supple Cardiovascular:  RRR nl s1 s2  Lungs:  Scattered rhonchi Abdomen:  Soft NT Musculoskeletal:  From  Skin:  clear   Recent Labs Lab 07/09/2013 1230  06/29/13 0519 06/30/13 0500  NA 140 142 137  K 3.9 3.5* 4.6  CL 99 99 96  CO2 27 32 31  BUN 43* 39* 37*  CREATININE 1.34* 1.33* 1.21*  GLUCOSE 218* 250* 152*    Recent Labs Lab 06/27/13 0555 06/10/2013 1230 06/29/13 0519  HGB 8.5* 8.1* 8.0*  HCT 25.5* 24.4* 24.9*  WBC 16.3* 12.3* 9.7  PLT 289 266 281   No results found.  ASSESSMENT / PLAN: Chronic respiratory failure in setting of GOLD Stage C COPD and element of OHS:  PFTs (12/10): FVC 67%, FEV1 58%, ratio 60%, TLC 95%, RV 138%, DLCO 43% (moderate obstructive defect). On home oxygen.  Possible OSA-->per records negative sleep study but had recorded apnea during conscious sedation in past  For TEE in 2010 -Given pulmonary co-morbids she is at significant risk for pulmonary complications such as: prolonged vent dependence, and post-op pulmonary complications, however these risks are not a absolute contraindication for surgery.  Plan/rec:  -would recover in either ICU -cont routine BDs (will escalate for post-op) -IS/flutter -we will see post-op   Diastolic congestive heart failure. Echo (11/13) with EF 60-65%, mild LVH, moderate biatrial enlargement.  H/o Afib on coumadin  Plan/rec Cont post-op bb  Holding coumadin for INR to normalize Will need post-op tele  Chronic kidney disease: h/o ANCA+, pauci-immune glomerulonephritis. Scr actually better than baseline (on average cr running 1.8-2.1) now 1.34 Plan: Keep euvolemic  Avoid hypotension  Renal dose meds   DM Plan: ssi   Caryl Bis  [phone]  Cell  [phone]  If no response or cell goes to voicemail, call beeper [phone]  Dorcas Carrow Pulmonary and Critical Care Medicine [facility] Pager: [phone]  06/30/2013, 12:04 PM

## 2013-06-30 NOTE — Progress Notes (Signed)
[facility] PROGRESS NOTE  Heidi HanlonCynthia A Burch BJY:[id number] DOB: 01/11/1931 DOA: 06/30/2013 PCP: Shan LevansPatrick Wright, MD  Assessment/Plan: Diverticulitis with abscess and colocutaneous fistula;  -Continue with IV antibiotics ( Vancomycin, Invanz)  -Management per surgery.  -Plan for surgery 3-23.   Malnutrition, protein caloric;  -continue with TPN. She is also on D 10. Monitor for pulmonary edema.   Hypokalemia; replete with 20 meq times 2.   Atrial fibrillation: rate controlled.  -Continue with bisoprolol.  -Reversed INR for surgery tomorrow. She will received 2 units of FFP and vitamin K.   COPD with chronic resp failure:  - Continue home inhalers  - Chronic prednisone and oxygen supplementation.   CKD Stage IV: Cr range 1.5 to 2.0; stable. Monitor on torsemide.  Cr today at 1.21. Monitor urine Out put.   Hypothyroidism; continue with synthroid.   Chronic diastolic heart failure: compensated at this moment.  -Last EF 60-65%  -Continue with torsemide.  -strict I's and O's, daily weight .   Diabetes; SSI.   Code Status: Full Code.  Family Communication: none at bedside.  Disposition Plan: remain inpatient.      Antibiotics: Vancomycin 3-20 Invanz 3-20   HPI/Subjective: Breathing ok. No nausea or significant abdominal pain.  Objective: Filed Vitals:   06/30/13 0538  BP: 124/72  Pulse: 72  Temp:   Resp: 18    Intake/Output Summary (Last 24 hours) at 06/30/13 1324 Last data filed at 06/30/13 0600  Gross per 24 hour  Intake 952.33 ml  Output   1160 ml  Net -207.67 ml   Filed Weights   06/29/13 0700  Weight: 84 kg (185 lb 3 oz)    Exam:   General:  No distress.   Cardiovascular: S 1, S 2 IRR  Respiratory: few ronchus and wheezes.   Abdomen: Bs present, left side drainage in place.   Musculoskeletal: trace edema.    Data Reviewed: Basic Metabolic Panel:  Recent Labs Lab 06/24/13 0500 06/25/13 0500 06/26/13 1130 06/27/13 0555  06/15/2013 1230 06/29/13 0519 06/30/13 0500  NA 148* 147 139 137 140 142 137  K 4.0 4.1 3.7 3.4* 3.9 3.5* 4.6  CL 102 104 98 97 99 99 96  CO2 29 30 30 29 27  32 31  GLUCOSE 94 81 168* 210* 218* 250* 152*  BUN 33* 39* 43* 46* 43* 39* 37*  CREATININE 2.19* 2.10* 1.64* 1.85* 1.34* 1.33* 1.21*  CALCIUM 7.8* 8.2* 8.7 8.3* 8.3* 8.4 8.6  MG 1.4* 1.9 1.7  --   --  1.7 1.6  PHOS 3.0 3.2 2.9 3.2  --  2.7 2.7   Liver Function Tests:  Recent Labs Lab 06/24/13 0500 06/26/13 1130 06/29/13 0519  AST 11 14 9   ALT 10 13 9   ALKPHOS 69 94 81  BILITOT 0.6 0.7 0.3  PROT 5.2* 5.8* 6.0  ALBUMIN 1.7* 1.9* 1.7*   No results found for this basename: LIPASE, AMYLASE,  in the last 168 hours No results found for this basename: AMMONIA,  in the last 168 hours CBC:  Recent Labs Lab 06/24/13 0500 06/25/13 0500 06/26/13 1130 06/27/13 0555 06/14/2013 1230 06/29/13 0519  WBC 13.2* 18.7* 20.1* 16.3* 12.3* 9.7  NEUTROABS 10.2* 15.5* 16.5* 12.8*  --  6.9  HGB 7.2* 7.3* 9.9* 8.5* 8.1* 8.0*  HCT 22.4* 21.7* 29.0* 25.5* 24.4* 24.9*  MCV 86.2 84.8 85.5 87.9 89.7 92.9  PLT 312 342 321 289 266 281   Cardiac Enzymes: No results found for this basename: CKTOTAL, CKMB, CKMBINDEX,  TROPONINI,  in the last 168 hours BNP (last 3 results)  Recent Labs  12/05/12 1558 06/06/13 2244 06/22/13 0900  PROBNP 277.0* 1764.0* 5835.0*   CBG:  Recent Labs Lab 06/29/13 0745 06/29/13 1252 06/29/13 1745 06/29/13 2149 06/30/13 0757  GLUCAP 218* 247* 202* 149* 173*    Recent Results (from the past 240 hour(s))  CULTURE, BLOOD (ROUTINE X 2)     Status: None   Collection Time    06/25/13 10:05 PM      Result Value Ref Range Status   Specimen Description BLOOD RIGHT HAND   Final   Special Requests BOTTLES DRAWN AEROBIC ONLY 3CC   Final   Culture  Setup Time     Final   Value: 06/26/2013 01:03     Performed at [facility]   Culture     Final   Value:        BLOOD CULTURE RECEIVED NO GROWTH TO DATE  CULTURE WILL BE HELD FOR 5 DAYS BEFORE ISSUING A FINAL NEGATIVE REPORT     Performed at [facility]   Report Status PENDING   Incomplete  CULTURE, BLOOD (ROUTINE X 2)     Status: None   Collection Time    06/25/13 10:10 PM      Result Value Ref Range Status   Specimen Description BLOOD LEFT WRIST   Final   Special Requests BOTTLES DRAWN AEROBIC ONLY 3CC   Final   Culture  Setup Time     Final   Value: 06/26/2013 01:03     Performed at [facility]   Culture     Final   Value:        BLOOD CULTURE RECEIVED NO GROWTH TO DATE CULTURE WILL BE HELD FOR 5 DAYS BEFORE ISSUING A FINAL NEGATIVE REPORT     Performed at [facility]   Report Status PENDING   Incomplete  CLOSTRIDIUM DIFFICILE BY PCR     Status: None   Collection Time    06/27/13  3:11 AM      Result Value Ref Range Status   C difficile by pcr NEGATIVE  NEGATIVE Final     Studies: No results found.  Scheduled Meds: . albuterol  2.5 mg Nebulization Q6H  . bisoprolol  5 mg Oral Daily  . budesonide  0.5 mg Nebulization BID  . ertapenem  1 g Intravenous Q24H  . famotidine  20 mg Oral Daily  . furosemide  20 mg Intravenous Once  . guaiFENesin  400 mg Oral BID  . insulin aspart  0-20 Units Subcutaneous QID  . levothyroxine  50 mcg Oral QAC breakfast  . multivitamin with minerals  1 tablet Oral Daily  . predniSONE  10 mg Oral Q breakfast  . sodium chloride  10-40 mL Intracatheter Q12H  . tiotropium  18 mcg Inhalation Daily  . torsemide  80 mg Oral Daily  . vancomycin  750 mg Intravenous Q24H   Continuous Infusions: . sodium chloride 20 mL/hr (06/29/13 1753)  . Marland KitchenTPN (CLINIMIX-E) Adult 60 mL/hr at 06/29/13 1842   And  . fat emulsion 240 mL (06/29/13 1842)  . Marland KitchenTPN (CLINIMIX-E) Adult     And  . fat emulsion      Active Problems:   Colonic diverticular abscess    Time spent: 35 minutes.     Hartley Barefoot A  [facility] Pager [phone]. If 7PM-7AM, please contact night-coverage  at [email or web address], password [facility] 06/30/2013, 1:24 PM  LOS: 2  days

## 2013-06-30 NOTE — Progress Notes (Addendum)
General surgery:  Patient is alert and stable.No fever or chills. Creatinine 1.21, BUN 37, glucose 152. inr 2.95  Heart: Irregularly irregular Abdomen: Soft. Not distended. Tender left lower quadrant and suprapubic area. No peritoneal signs. Small umbilical hernia. Drain has feculent material in it. Foley was yellow urine but some fecal residue.  Assessment/plan:  Diverticulitis with abscess and colocutaneous and colovesical fistula We will get her ready for laparotomy, Hartmann resection with colostomy possibly as early as tomorrow. I discussed this with her in detail. She is in full agreement  Coagulopathy persists. This will be  reversed with FFP and vitamin K today  Chronic atrial fibrillation  Atrial fibrillation and coronary artery disease. Dr. Gala RomneyBensimhon  states that the risk assessment is unchanged from February.  COPD. High risk for postop VDRF. We have asked Dr. Danise MinaPat Wright to see her preop.  Angelia MouldHaywood M. Derrell LollingIngram, M.D., [facility] General and Minimally invasive Surgery Breast and Colorectal Surgery Office:   [phone] Pager:   [phone]

## 2013-06-30 NOTE — Progress Notes (Signed)
Interviewed and examined. Agree with assessment and plans as outlined by Ms. Riebock, NP.  Will need laparotomy and Hartmann resection early this coming week. I discussed surgical details with patient and family, who understand and agree.  Await preop cardiology risk assessment and Internal medicine consult and review of MMP.   Angelia MouldHaywood M. Derrell LollingIngram, M.D., [facility] General and Minimally invasive Surgery Breast and Colorectal Surgery Office:   [phone] Pager:   [phone]

## 2013-06-30 NOTE — Progress Notes (Addendum)
PARENTERAL NUTRITION CONSULT NOTE - FOLLOW UP  Pharmacy Consult for TPN Indication: colocutaneous fistula  Allergies  Allergen Reactions  . Sulfonamide Derivatives Other (See Comments)    Childhood reaction    Patient Measurements: Height: 5\' 3"  (160 cm) Weight: 185 lb 3 oz (84 kg) IBW/kg (Calculated) : 52.4 Adjusted Body Weight: 65kg  Vital Signs: Temp: 98.1 F (36.7 C) (03/21 2151) Temp src: Oral (03/22 0538) BP: 124/72 mmHg (03/22 0538) Pulse Rate: 72 (03/22 0538) Intake/Output from previous day: 03/21 0701 - 03/22 0700 In: 952.3 [I.V.:942.3] Out: 1160 [Drains:1160] Intake/Output from this shift:    Labs:  Recent Labs  July 14, 2013 1230 06/29/13 0519 06/30/13 0500  WBC 12.3* 9.7  --   HGB 8.1* 8.0*  --   HCT 24.4* 24.9*  --   PLT 266 281  --   INR 2.32* 2.89* 2.95*     Recent Labs  2013/07/14 1230 06/29/13 0519 06/30/13 0500  NA 140 142 137  K 3.9 3.5* 4.6  CL 99 99 96  CO2 27 32 31  GLUCOSE 218* 250* 152*  BUN 43* 39* 37*  CREATININE 1.34* 1.33* 1.21*  CALCIUM 8.3* 8.4 8.6  MG  --  1.7 1.6  PHOS  --  2.7 2.7  PROT  --  6.0  --   ALBUMIN  --  1.7*  --   AST  --  9  --   ALT  --  9  --   ALKPHOS  --  81  --   BILITOT  --  0.3  --   PREALBUMIN  --  10.5*  --   TRIG  --  284*  --    Estimated Creatinine Clearance: 37.4 ml/min (by C-G formula based on Cr of 1.21).    Recent Labs  06/29/13 1745 06/29/13 2149 06/30/13 0757  GLUCAP 202* 149* 173*   Insulin Requirements in the past 24 hours:  17 units SSI  Current Nutrition:  Clinimix E 5/15 at 55mL/hr + lipid 20% emulsion at 45mL/hr- this will provide 72g protein and 1584kcal daily *Note, received written records from [facility] and patient was on Clinimix E 4.25/25 at 77mL/hr with lipids MWF. This is different from what was reported in yesterday's note from information received over the phone*  Nutritional Goals:  1600-1800 kCal, 80-95 grams of protein per day per RD recs on  3/21  Admit:  83 YOF recently hospitaliazed for PNA and flu and developed diverticulitis with abscess which required drain placement. This week her drain was attempted to be upsized d/t stool in abscess cavity and she continued to have worsening pain and weakness.   GI: has been NPO except for medications; IR does not recommend repositioning current drain, an additional drain could be placed if patient does not have surgery. Drain output 1217mL/24; surgery evaluated and is planning laparotomy and Hartmann resection early this week. PO Pepcid, PO multivitamin. Prealbumin was 13.2 on 3/18- this admission it decreased to 10.5.  Endo: hx hypothyroid, TSH 06/22/2013 1.9, continues on Synthroid 50; hx DM- 06/22/2013 A1C 7%. CBGs 149-250 last 24 hours- appears as though patient's CBGs were higher when she was on D10 as opposed to when TPN started; she is also on prednisone  Lytes: K 4.6- she is on KCl PO daily and received extra repletion yesterday, Phos 2.7, Mag 1.6- indicates she is tolerating new formula of TPN; other lytes WNL  Renal: hx CKD; SCr decreased to 1.21 with est CrCL ~35-41mL/min; urinated 3x in last  24 hours  Pulm: 96/2L [place]; Spiriva  Cards: VSS on bisoprolol, torsemide and scheduled KCl 20mEq daily; cards to evaluate prior to surgery  Hepatobil: LFTs wnl except albumin is low at 1.7. Baseline trigs elevated at 284.  Neuro:  A&O  ID: was on Flagyl and tygecycline at [facility], changed to ertapenem and vancomycin here- D#3 for both of those. WBc 9.7, afebrile for last 24 hours  Best Practices: to switch to heparin when INR <2- INR is currently 2.95. Unsure why this is climbing as patient has not received warfarin the past 2 evenings. All other LFTs are WNL  TPN Access: R PICC TPN day#: 5 (started 3/17 in [facility])  Plan:  1. Magnesium sulfate 2g IV x1 as repletion 2. Increase Clinimix E 5/15 to 7675mL/hr + lipid 20% emulsion at 4510mL/hr- this will provide 90g protein and 1872kcal  daily. 3. NO multivitamin in TPN as patient is receiving daily as PO- will reassess if this changes 4. Trace elements MWF d/t national backorder 5. [facility]  6. CBGs and resistant SSI 4 times daily 7. Will stop PO KCl and K climbed today. Will replete as needed 8. Will evaluate CBGs for 1 more day before making decision regarding adding any insulin to TPN bag- more SSI was given when patient was on D10 as opposed to TPN. 9. TPN labs as ordered  Eaven Schwager D. Jahmad Petrich, PharmD, BCPS Clinical Pharmacist Pager: [phone] 06/30/2013 8:56 AM

## 2013-06-30 NOTE — Progress Notes (Signed)
ANTICOAGULATION CONSULT NOTE - Follow Up Consult  Pharmacy Consult for heparin Indication: atrial fibrillation  Allergies  Allergen Reactions  . Sulfonamide Derivatives Other (See Comments)    Childhood reaction    Patient Measurements: Height: 5\' 3"  (160 cm) Weight: 185 lb 3 oz (84 kg) IBW/kg (Calculated) : 52.4 Heparin Dosing Weight: 71kg  Vital Signs: Temp src: Oral (03/22 0538) BP: 124/72 mmHg (03/22 0538) Pulse Rate: 72 (03/22 0538)  Labs:  Recent Labs  06/11/2013 1230 06/29/13 0519 06/30/13 0500  HGB 8.1* 8.0*  --   HCT 24.4* 24.9*  --   PLT 266 281  --   LABPROT 24.7* 29.2* 29.7*  INR 2.32* 2.89* 2.95*  CREATININE 1.34* 1.33* 1.21*    Estimated Creatinine Clearance: 37.4 ml/min (by C-G formula based on Cr of 1.21).   Assessment: [age] [facility] d[facility]t progressive pain and weakness relative to her fistula and drain change. On warfarin for AFib which has been held d/t possible surgery. She is to start heparin when INR is <2. This morning INR is 2.95. Hgb is low but stable. plts WNL. No bleeding noted.  Able to obtain records from [facility] regarding INR and warfarin doses 3/17 INR = 1.37, given 7.5mg  warfarin 3/18 INR = 1.86, given 4mg  warfarin 3/19 INR = 2.7, warfarin orders were not entered  Goal of Therapy:  INR <2 Heparin level 0.3-0.7 units/ml Monitor platelets by anticoagulation protocol: Yes   Plan:  1. Continue to hold anticoagulation with elevated INR 2. Daily INR 3. Will start heparin when INR <2  Cheril Slattery D. Valoria Tamburri, PharmD, BCPS Clinical Pharmacist Pager: [phone] 06/30/2013 10:11 AM

## 2013-07-01 ENCOUNTER — Ambulatory Visit (INDEPENDENT_AMBULATORY_CARE_PROVIDER_SITE_OTHER): Payer: Medicare Other | Admitting: Surgery

## 2013-07-01 LAB — DIFFERENTIAL
BASOS ABS: 0.1 10*3/uL (ref 0.0–0.1)
Basophils Relative: 1 % (ref 0–1)
Eosinophils Absolute: 0.1 10*3/uL (ref 0.0–0.7)
Eosinophils Relative: 1 % (ref 0–5)
LYMPHS ABS: 1.5 10*3/uL (ref 0.7–4.0)
LYMPHS PCT: 17 % (ref 12–46)
MONOS PCT: 11 % (ref 3–12)
Monocytes Absolute: 1 10*3/uL (ref 0.1–1.0)
Neutro Abs: 6.2 10*3/uL (ref 1.7–7.7)
Neutrophils Relative %: 70 % (ref 43–77)

## 2013-07-01 LAB — COMPREHENSIVE METABOLIC PANEL
ALT: 9 U/L (ref 0–35)
AST: 9 U/L (ref 0–37)
Albumin: 2.1 g/dL — ABNORMAL LOW (ref 3.5–5.2)
Alkaline Phosphatase: 76 U/L (ref 39–117)
BUN: 41 mg/dL — ABNORMAL HIGH (ref 6–23)
CALCIUM: 9.1 mg/dL (ref 8.4–10.5)
CO2: 36 meq/L — AB (ref 19–32)
CREATININE: 1.12 mg/dL — AB (ref 0.50–1.10)
Chloride: 93 mEq/L — ABNORMAL LOW (ref 96–112)
GFR calc non Af Amer: 45 mL/min — ABNORMAL LOW (ref >90)
GFR, EST AFRICAN AMERICAN: 52 mL/min — AB (ref >90)
GLUCOSE: 184 mg/dL — AB (ref 70–99)
Potassium: 4.4 mEq/L (ref 3.7–5.3)
Sodium: 138 mEq/L (ref 137–147)
TOTAL PROTEIN: 6.6 g/dL (ref 6.0–8.3)
Total Bilirubin: 0.3 mg/dL (ref 0.3–1.2)

## 2013-07-01 LAB — URINE CULTURE: Colony Count: 40000

## 2013-07-01 LAB — PREPARE FRESH FROZEN PLASMA
UNIT DIVISION: 0
Unit division: 0

## 2013-07-01 LAB — MAGNESIUM: Magnesium: 2 mg/dL (ref 1.5–2.5)

## 2013-07-01 LAB — GLUCOSE, CAPILLARY
GLUCOSE-CAPILLARY: 190 mg/dL — AB (ref 70–99)
Glucose-Capillary: 292 mg/dL — ABNORMAL HIGH (ref 70–99)
Glucose-Capillary: 305 mg/dL — ABNORMAL HIGH (ref 70–99)
Glucose-Capillary: 231 mg/dL — ABNORMAL HIGH (ref 70–99)
Glucose-Capillary: 143 mg/dL — ABNORMAL HIGH (ref 70–99)

## 2013-07-01 LAB — PROTIME-INR
INR: 1.05 (ref 0.00–1.49)
PROTHROMBIN TIME: 13.5 s (ref 11.6–15.2)

## 2013-07-01 LAB — CBC
HCT: 24.3 % — ABNORMAL LOW (ref 36.0–46.0)
Hemoglobin: 7.8 g/dL — ABNORMAL LOW (ref 12.0–15.0)
MCH: 29.9 pg (ref 26.0–34.0)
MCHC: 32.1 g/dL (ref 30.0–36.0)
MCV: 93.1 fL (ref 78.0–100.0)
PLATELETS: 305 10*3/uL (ref 150–400)
RBC: 2.61 MIL/uL — ABNORMAL LOW (ref 3.87–5.11)
RDW: 24 % — ABNORMAL HIGH (ref 11.5–15.5)
WBC: 8.9 10*3/uL (ref 4.0–10.5)

## 2013-07-01 LAB — TRIGLYCERIDES: Triglycerides: 336 mg/dL — ABNORMAL HIGH (ref <150)

## 2013-07-01 LAB — PHOSPHORUS: PHOSPHORUS: 3.8 mg/dL (ref 2.3–4.6)

## 2013-07-01 LAB — HEPARIN LEVEL (UNFRACTIONATED): Heparin Unfractionated: 0.21 IU/mL — ABNORMAL LOW (ref 0.30–0.70)

## 2013-07-01 MED ORDER — TRACE MINERALS CR-CU-F-FE-I-MN-MO-SE-ZN IV SOLN
INTRAVENOUS | Status: AC
  Administered 2013-07-01: 17:00:00 via INTRAVENOUS
  Filled 2013-07-01: qty 2000

## 2013-07-01 MED ORDER — ALBUTEROL SULFATE (2.5 MG/3ML) 0.083% IN NEBU
2.5000 mg | INHALATION_SOLUTION | Freq: Three times a day (TID) | RESPIRATORY_TRACT | Status: DC
  Administered 2013-07-02 (×3): 2.5 mg via RESPIRATORY_TRACT
  Filled 2013-07-01 (×3): qty 3

## 2013-07-01 MED ORDER — HEPARIN (PORCINE) IN NACL 100-0.45 UNIT/ML-% IJ SOLN
1350.0000 [IU]/h | INTRAMUSCULAR | Status: AC
  Administered 2013-07-01: 1000 [IU]/h via INTRAVENOUS
  Administered 2013-07-02: 1150 [IU]/h via INTRAVENOUS
  Administered 2013-07-03: 1350 [IU]/h via INTRAVENOUS
  Filled 2013-07-01 (×3): qty 250

## 2013-07-01 MED ORDER — INSULIN ASPART 100 UNIT/ML ~~LOC~~ SOLN
0.0000 [IU] | SUBCUTANEOUS | Status: DC
  Administered 2013-07-01: 15 [IU] via SUBCUTANEOUS
  Administered 2013-07-01: 7 [IU] via SUBCUTANEOUS
  Administered 2013-07-02: 4 [IU] via SUBCUTANEOUS
  Administered 2013-07-02 (×2): 3 [IU] via SUBCUTANEOUS
  Administered 2013-07-02 (×2): 7 [IU] via SUBCUTANEOUS
  Administered 2013-07-02 – 2013-07-03 (×3): 3 [IU] via SUBCUTANEOUS

## 2013-07-01 MED ORDER — FAT EMULSION 20 % IV EMUL
240.0000 mL | INTRAVENOUS | Status: AC
  Administered 2013-07-01: 240 mL via INTRAVENOUS
  Filled 2013-07-01: qty 250

## 2013-07-01 MED ORDER — HEPARIN BOLUS VIA INFUSION
3000.0000 [IU] | Freq: Once | INTRAVENOUS | Status: AC
  Administered 2013-07-01: 3000 [IU] via INTRAVENOUS
  Filled 2013-07-01: qty 3000

## 2013-07-01 NOTE — Consult Note (Signed)
I agree with NP Charlyne Petrinaster note. I have personally seen and examined the patient, reviewed the chart, notes, labs, CT A./P images. Discussed with patient and family nature, r/b/a to cystoscopy, bilateral RGP and ureteral stent placement, closure of cystotomy/fistula if necessary. All questions answered. They elects to proceed. Discussed depending on timing the procedure(s) might be performed by me or one of my associates.

## 2013-07-01 NOTE — Consult Note (Signed)
Urology Consult   Physician requesting consult: Derrell Lolling  Reason for consult: diverticulitis with colovesical fistula  History of Present Illness: [name] is a 80 y.o. white female with a past medical history significant for a fib, advanced COPD requiring O2, encephalopathy, and diabetes.  She had diverticulitis in 2013 and again in December 2014 treated with antibiotics.  The suspicion for an abscess was low. Due to ongoing left lower quadrant abdominal pain she returned to the hospital about a month ago. A diverticular abscess was found on CT in the left pelvis.  A drain was placed initially on 2/27.  It has been replaced on 3/3 and 3/16.  She has had multiple CT scans since admission which confirm a persistent abscess.  She remains on vancomycin.  Her CT scan on 3/19 showed possible air in the bladder thus urology has been consulted.  No hydronephrosis was seen.  UA shows TNTC WBC, 21-50 RBC, and many bacteria.  A urine culture is pending.  [name] has had several uncomplicated UTIs in the past that have been treated with antibiotics.  She otherwise has a negative GU history and denies any prior voiding or storage urinary symptoms, hematuria, STDs, urolithiasis, GU malignancy/trauma/surgery. Her daughters helped provide the history as she was sleeping intermittently.  Past Medical History  Diagnosis Date  . Diabetes mellitus   . Obesity   . COPD (chronic obstructive pulmonary disease)     Moderate. PFTs (12/10): FVC 67%, FEV1 58%, ratio 60%, TLC 95%, RV 138%, DLCO 43% (moderate obstructive defect). She is on home oxygen that should be worn at all times.  . Diastolic CHF, chronic     Echo (3/12): EF 55-60%, mild LV hypertrophy, grade I diastolic dysfunction, mild left atrial enlargement, normal pulmonary artery pressure  . CKD (chronic kidney disease)     Cr 2.4 when last checked. 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 PNA (pneumonia)     h/o with  ARDS in 2005; MRSA PNA with prolonged hospitalization in [facility] 11/11  . Diverticulum     Jejunal  . OSA (obstructive sleep apnea)     possible. When pt was sedated for TEE in May 2010, she did develop some upper airway obstruction  . Anemia     of chronic disease  . Hypothyroidism   . Gout   . Atrial fibrillation 07/2008    Failed cardioversion with TEE guidance in May 2010. Pt went back to NSR by 11/2008 with amiodarne  . Hypertension   . GERD (gastroesophageal reflux disease)   . Peripheral vascular disease   . Pneumonia   . Chronic kidney disease   . Respiratory failure, chronic 07/01/2011  . Chronic gout due to renal impairment 07/01/2011    Past Surgical History  Procedure Laterality Date  . Tracheostomy      ARDS/ ICU admission, trach 2005  . Microperforation  08/2008    Of jejunem, ICU admission     Current Hospital Medications:  Home meds:    Medication List    ASK your doctor about these medications       albuterol (2.5 MG/3ML) 0.083% nebulizer solution  Commonly known as:  PROVENTIL  Take 3 mLs (2.5 mg total) by nebulization 2 (two) times daily. Dx: 492.8 COPD with Emphysema     barrier cream Crea  Commonly known as:  non-specified  Apply 1 application topically 3 (three) times daily as needed (place around drain area to keep it dry and clean).  bisoprolol 10 MG tablet  Commonly known as:  ZEBETA  Take 5 mg by mouth daily.     budesonide 0.5 MG/2ML nebulizer solution  Commonly known as:  PULMICORT  Take 0.5 mg by nebulization 2 (two) times daily.     famotidine 20 MG tablet  Commonly known as:  PEPCID  Take 20 mg by mouth daily.     feeding supplement (PRO-STAT SUGAR FREE 64) Liqd  Take 30 mLs by mouth 2 (two) times daily.     guaiFENesin 600 MG 12 hr tablet  Commonly known as:  MUCINEX  Take 600 mg by mouth 2 (two) times daily.     insulin aspart 100 UNIT/ML injection  Commonly known as:  novoLOG  Inject into the skin every 6  (six) hours. Sliding scale     levothyroxine 50 MCG tablet  Commonly known as:  SYNTHROID, LEVOTHROID  Take 50 mcg by mouth daily.     metroNIDAZOLE 5-0.79 MG/ML-% IVPB  Commonly known as:  FLAGYL  Inject 500 mg into the vein every 6 (six) hours.     multivitamins ther. w/minerals Tabs tablet  Take 1 tablet by mouth daily.     oxyCODONE 5 MG immediate release tablet  Commonly known as:  Oxy IR/ROXICODONE  Take 5 mg by mouth every 12 (twelve) hours.     RISA-BID PROBIOTIC PO  Take 1 capsule by mouth 2 (two) times daily.     torsemide 20 MG tablet  Commonly known as:  DEMADEX  Take 80 mg by mouth daily.     TUDORZA PRESSAIR 400 MCG/ACT Aepb  Generic drug:  Aclidinium Bromide  Inhale 1 puff into the lungs 2 (two) times daily.     TYGACIL 50 MG injection  Generic drug:  tigecycline  Inject 50 mg into the vein 2 (two) times daily.        Scheduled Meds: . albuterol  2.5 mg Nebulization Q6H  . bisoprolol  5 mg Oral Daily  . budesonide  0.5 mg Nebulization BID  . ertapenem  1 g Intravenous Q24H  . famotidine  20 mg Oral Daily  . guaiFENesin  400 mg Oral BID  . insulin aspart  0-20 Units Subcutaneous 6 times per day  . levothyroxine  50 mcg Oral QAC breakfast  . multivitamin with minerals  1 tablet Oral Daily  . predniSONE  10 mg Oral Q breakfast  . sodium chloride  10-40 mL Intracatheter Q12H  . tiotropium  18 mcg Inhalation Daily  . torsemide  80 mg Oral Daily  . vancomycin  750 mg Intravenous Q24H   Continuous Infusions: . sodium chloride 20 mL/hr (06/30/13 1504)  . Marland KitchenTPN (CLINIMIX-E) Adult 75 mL/hr at 06/30/13 1842   And  . fat emulsion 240 mL (06/30/13 1842)  . Marland KitchenTPN (CLINIMIX-E) Adult     And  . fat emulsion    . heparin 1,000 Units/hr (07/01/13 1114)   PRN Meds:.acetaminophen, acetaminophen, albuterol, barrier cream, diphenhydrAMINE, diphenhydrAMINE, HYDROcodone-acetaminophen, morphine injection, ondansetron, sodium chloride, traMADol  Allergies:   Allergies  Allergen Reactions  . Sulfonamide Derivatives Other (See Comments)    Childhood reaction    Family History  Problem Relation Age of Onset  . Emphysema Mother     and asthma  . Asthma Mother   . Heart disease Neg Hx     premature    Social History:  reports that she quit smoking about 37 years ago. Her smoking use included Cigarettes. She has a 23 pack-year smoking history.  She has never used smokeless tobacco. She reports that she drinks alcohol. She reports that she does not use illicit drugs.  ROS: General: sleeping peacefully in no acute distress Respiratory: O2 canula well placed, normal rhythm and effort GU: foley catheter indwelling, noted to be draining yellow urine  Physical Exam:  Vital signs in last 24 hours: Temp:  [97.9 F (36.6 C)-98.4 F (36.9 C)] 98.4 F (36.9 C) (03/23 1449) Pulse Rate:  [90-96] 93 (03/23 1303) Resp:  [18] 18 (03/23 1303) BP: (120-137)/(54-90) 137/90 mmHg (03/23 1303) SpO2:  [95 %-99 %] 95 % (03/23 1303) Weight:  [84 kg (185 lb 3 oz)] 84 kg (185 lb 3 oz) (03/23 1028) Constitutional: sleeping peacefully Respiratory: nasal canula in place, normal rhythm and effort GU: foley in place and draining well into bedside bag  Laboratory Data:   Recent Labs  06/29/13 0519 07/01/13 0500  WBC 9.7 8.9  HGB 8.0* 7.8*  HCT 24.9* 24.3*  PLT 281 305     Recent Labs  06/29/13 0519 06/30/13 0500 07/01/13 0500  NA 142 137 138  K 3.5* 4.6 4.4  CL 99 96 93*  GLUCOSE 250* 152* 184*  BUN 39* 37* 41*  CALCIUM 8.4 8.6 9.1  CREATININE 1.33* 1.21* 1.12*     Results for orders placed during the hospital encounter of 06/27/2013 (from the past 24 hour(s))  GLUCOSE, CAPILLARY     Status: Abnormal   Collection Time    06/30/13  5:27 PM      Result Value Ref Range   Glucose-Capillary 289 (*) 70 - 99 mg/dL   Comment 1 Notify RN    GLUCOSE, CAPILLARY     Status: Abnormal   Collection Time    06/30/13 10:10 PM      Result Value Ref  Range   Glucose-Capillary 197 (*) 70 - 99 mg/dL  COMPREHENSIVE METABOLIC PANEL     Status: Abnormal   Collection Time    07/01/13  5:00 AM      Result Value Ref Range   Sodium 138  137 - 147 mEq/L   Potassium 4.4  3.7 - 5.3 mEq/L   Chloride 93 (*) 96 - 112 mEq/L   CO2 36 (*) 19 - 32 mEq/L   Glucose, Bld 184 (*) 70 - 99 mg/dL   BUN 41 (*) 6 - 23 mg/dL   Creatinine, Ser 1.611.12 (*) 0.50 - 1.10 mg/dL   Calcium 9.1  8.4 - 09.610.5 mg/dL   Total Protein 6.6  6.0 - 8.3 g/dL   Albumin 2.1 (*) 3.5 - 5.2 g/dL   AST 9  0 - 37 U/L   ALT 9  0 - 35 U/L   Alkaline Phosphatase 76  39 - 117 U/L   Total Bilirubin 0.3  0.3 - 1.2 mg/dL   GFR calc non Af Amer 45 (*) >90 mL/min   GFR calc Af Amer 52 (*) >90 mL/min  MAGNESIUM     Status: None   Collection Time    07/01/13  5:00 AM      Result Value Ref Range   Magnesium 2.0  1.5 - 2.5 mg/dL  PHOSPHORUS     Status: None   Collection Time    07/01/13  5:00 AM      Result Value Ref Range   Phosphorus 3.8  2.3 - 4.6 mg/dL  CBC     Status: Abnormal   Collection Time    07/01/13  5:00 AM      Result  Value Ref Range   WBC 8.9  4.0 - 10.5 K/uL   RBC 2.61 (*) 3.87 - 5.11 MIL/uL   Hemoglobin 7.8 (*) 12.0 - 15.0 g/dL   HCT 78.2 (*) 95.6 - 21.3 %   MCV 93.1  78.0 - 100.0 fL   MCH 29.9  26.0 - 34.0 pg   MCHC 32.1  30.0 - 36.0 g/dL   RDW 08.6 (*) 57.8 - 46.9 %   Platelets 305  150 - 400 K/uL  DIFFERENTIAL     Status: None   Collection Time    07/01/13  5:00 AM      Result Value Ref Range   Neutrophils Relative % 70  43 - 77 %   Lymphocytes Relative 17  12 - 46 %   Monocytes Relative 11  3 - 12 %   Eosinophils Relative 1  0 - 5 %   Basophils Relative 1  0 - 1 %   Neutro Abs 6.2  1.7 - 7.7 K/uL   Lymphs Abs 1.5  0.7 - 4.0 K/uL   Monocytes Absolute 1.0  0.1 - 1.0 K/uL   Eosinophils Absolute 0.1  0.0 - 0.7 K/uL   Basophils Absolute 0.1  0.0 - 0.1 K/uL   RBC Morphology POLYCHROMASIA PRESENT    PROTIME-INR     Status: None   Collection Time    07/01/13   5:00 AM      Result Value Ref Range   Prothrombin Time 13.5  11.6 - 15.2 seconds   INR 1.05  0.00 - 1.49  TRIGLYCERIDES     Status: Abnormal   Collection Time    07/01/13  5:00 AM      Result Value Ref Range   Triglycerides 336 (*) <150 mg/dL  GLUCOSE, CAPILLARY     Status: Abnormal   Collection Time    07/01/13  7:54 AM      Result Value Ref Range   Glucose-Capillary 190 (*) 70 - 99 mg/dL  GLUCOSE, CAPILLARY     Status: Abnormal   Collection Time    07/01/13 11:26 AM      Result Value Ref Range   Glucose-Capillary 292 (*) 70 - 99 mg/dL  GLUCOSE, CAPILLARY     Status: Abnormal   Collection Time    07/01/13  2:27 PM      Result Value Ref Range   Glucose-Capillary 305 (*) 70 - 99 mg/dL   Recent Results (from the past 240 hour(s))  CULTURE, BLOOD (ROUTINE X 2)     Status: None   Collection Time    06/25/13 10:05 PM      Result Value Ref Range Status   Specimen Description BLOOD RIGHT HAND   Final   Special Requests BOTTLES DRAWN AEROBIC ONLY 3CC   Final   Culture  Setup Time     Final   Value: 06/26/2013 01:03     Performed at [facility]   Culture     Final   Value:        BLOOD CULTURE RECEIVED NO GROWTH TO DATE CULTURE WILL BE HELD FOR 5 DAYS BEFORE ISSUING A FINAL NEGATIVE REPORT     Performed at [facility]   Report Status PENDING   Incomplete  CULTURE, BLOOD (ROUTINE X 2)     Status: None   Collection Time    06/25/13 10:10 PM      Result Value Ref Range Status   Specimen Description BLOOD LEFT WRIST  Final   Special Requests BOTTLES DRAWN AEROBIC ONLY 3CC   Final   Culture  Setup Time     Final   Value: 06/26/2013 01:03     Performed at [facility]   Culture     Final   Value:        BLOOD CULTURE RECEIVED NO GROWTH TO DATE CULTURE WILL BE HELD FOR 5 DAYS BEFORE ISSUING A FINAL NEGATIVE REPORT     Performed at [facility]   Report Status PENDING   Incomplete  CLOSTRIDIUM DIFFICILE BY PCR     Status: None   Collection Time     06/27/13  3:11 AM      Result Value Ref Range Status   C difficile by pcr NEGATIVE  NEGATIVE Final    Renal Function:  Recent Labs  06/25/13 0500 06/26/13 1130 06/27/13 0555 06/20/2013 1230 06/29/13 0519 06/30/13 0500 07/01/13 0500  CREATININE 2.10* 1.64* 1.85* 1.34* 1.33* 1.21* 1.12*   Estimated Creatinine Clearance: 40.4 ml/min (by C-G formula based on Cr of 1.12).   Impression/Recommendation: Ms. Atilano needs ureteral stenting to prevent obstruction. We will staff [name] with a urologist. Dr. Mena Goes will come by this evening to consent her for surgery.  She will likely have a cysto, stent placement, and possible bladder closure if needed.  I briefly discussed this procedure in detail with [name] and her daughters, but again Dr. Mena Goes will further discuss this with them.  Our surgery scheduler in the office will coordinate this procedure and either Dr. Mena Goes or another physician in our office will perform the procedure. Pending urine culture results, it will be made sure that [name] is on appropriate antibiotics for the specific bacteria. Her foley catheter will remain indwelling until further notice.   Denna Haggard 07/01/2013, 3:11 PM

## 2013-07-01 NOTE — Progress Notes (Signed)
Patient ID: [name], female   DOB: 09-27-1930, 80 y.o.   MRN: [id number]  Subjective: White count is normal.  Afebrile.  VSS.  Denies sob.  CBGs a bit high.  Objective:  Vital signs:  Filed Vitals:   06/30/13 2049 06/30/13 2205 07/01/13 0256 07/01/13 0658  BP:  122/54  120/62  Pulse:  90  96  Temp:  98.4 F (36.9 C)  98.2 F (36.8 C)  TempSrc:  Oral  Oral  Resp:  18  18  Height:      Weight:      SpO2: 98% 96% 97% 95%       Intake/Output   Yesterday:  03/22 0701 - 03/23 0700 In: 1180 [I.V.:480; TPN:690] Out: 3405 [Urine:3400; Drains:5] This shift:    I/O last 3 completed shifts: In: 1422.3 [I.V.:722.3; Other:10] Out: 8841 [Urine:4550; Drains:5]    Physical Exam:  General: Pt awake/alert/oriented x3 in no acute distress  Chest: coarse  CV: Irregularly irregular  Abdomen: Soft. Nondistended. Mild ttp umbilical region and llq. No evidence of peritonitis. Small umbilical hernia. Drain with stool. Foley with fecal matter.  Ext: SCDs BLE. No mjr edema. No cyanosis   Problem List:   Active Problems:   Colonic diverticular abscess    Results:   Labs: Results for orders placed during the hospital encounter of 07/07/2013 (from the past 48 hour(s))  GLUCOSE, CAPILLARY     Status: Abnormal   Collection Time    06/29/13 12:52 PM      Result Value Ref Range   Glucose-Capillary 247 (*) 70 - 99 mg/dL   Comment 1 Notify RN    GLUCOSE, CAPILLARY     Status: Abnormal   Collection Time    06/29/13  5:45 PM      Result Value Ref Range   Glucose-Capillary 202 (*) 70 - 99 mg/dL   Comment 1 Notify RN    GLUCOSE, CAPILLARY     Status: Abnormal   Collection Time    06/29/13  9:49 PM      Result Value Ref Range   Glucose-Capillary 149 (*) 70 - 99 mg/dL  PROTIME-INR     Status: Abnormal   Collection Time    06/30/13  5:00 AM      Result Value Ref Range   Prothrombin Time 29.7 (*) 11.6 - 15.2 seconds   INR 2.95 (*) 0.00 - 6.60  BASIC METABOLIC PANEL     Status:  Abnormal   Collection Time    06/30/13  5:00 AM      Result Value Ref Range   Sodium 137  137 - 147 mEq/L   Potassium 4.6  3.7 - 5.3 mEq/L   Comment: DELTA CHECK NOTED     NO VISIBLE HEMOLYSIS   Chloride 96  96 - 112 mEq/L   CO2 31  19 - 32 mEq/L   Glucose, Bld 152 (*) 70 - 99 mg/dL   BUN 37 (*) 6 - 23 mg/dL   Creatinine, Ser 1.21 (*) 0.50 - 1.10 mg/dL   Calcium 8.6  8.4 - 10.5 mg/dL   GFR calc non Af Amer 41 (*) >90 mL/min   GFR calc Af Amer 47 (*) >90 mL/min   Comment: (NOTE)     The eGFR has been calculated using the CKD EPI equation.     This calculation has not been validated in all clinical situations.     eGFR's persistently <90 mL/min signify possible Chronic Kidney     Disease.  MAGNESIUM     Status: None   Collection Time    06/30/13  5:00 AM      Result Value Ref Range   Magnesium 1.6  1.5 - 2.5 mg/dL  PHOSPHORUS     Status: None   Collection Time    06/30/13  5:00 AM      Result Value Ref Range   Phosphorus 2.7  2.3 - 4.6 mg/dL  GLUCOSE, CAPILLARY     Status: Abnormal   Collection Time    06/30/13  7:57 AM      Result Value Ref Range   Glucose-Capillary 173 (*) 70 - 99 mg/dL   Comment 1 Notify RN    PREPARE FRESH FROZEN PLASMA     Status: None   Collection Time    06/30/13 10:28 AM      Result Value Ref Range   Unit Number [id number]     Blood Component Type THAWED PLASMA     Unit division 00     Status of Unit ISSUED     Transfusion Status OK TO TRANSFUSE     Unit Number [id number]     Blood Component Type THAWED PLASMA     Unit division 00     Status of Unit ISSUED     Transfusion Status OK TO TRANSFUSE    URINALYSIS, ROUTINE W REFLEX MICROSCOPIC     Status: Abnormal   Collection Time    06/30/13 10:58 AM      Result Value Ref Range   Color, Urine YELLOW  YELLOW   APPearance TURBID (*) CLEAR   Specific Gravity, Urine 1.009  1.005 - 1.030   pH 6.0  5.0 - 8.0   Glucose, UA NEGATIVE  NEGATIVE mg/dL   Hgb urine dipstick LARGE (*) NEGATIVE    Bilirubin Urine NEGATIVE  NEGATIVE   Ketones, ur NEGATIVE  NEGATIVE mg/dL   Protein, ur NEGATIVE  NEGATIVE mg/dL   Urobilinogen, UA 0.2  0.0 - 1.0 mg/dL   Nitrite NEGATIVE  NEGATIVE   Leukocytes, UA LARGE (*) NEGATIVE  URINE MICROSCOPIC-ADD ON     Status: Abnormal   Collection Time    06/30/13 10:58 AM      Result Value Ref Range   Squamous Epithelial / LPF RARE  RARE   WBC, UA TOO NUMEROUS TO COUNT  <3 WBC/hpf   RBC / HPF 21-50  <3 RBC/hpf   Bacteria, UA MANY (*) RARE  GLUCOSE, CAPILLARY     Status: Abnormal   Collection Time    06/30/13 12:50 PM      Result Value Ref Range   Glucose-Capillary 273 (*) 70 - 99 mg/dL   Comment 1 Notify RN    GLUCOSE, CAPILLARY     Status: Abnormal   Collection Time    06/30/13  5:27 PM      Result Value Ref Range   Glucose-Capillary 289 (*) 70 - 99 mg/dL   Comment 1 Notify RN    GLUCOSE, CAPILLARY     Status: Abnormal   Collection Time    06/30/13 10:10 PM      Result Value Ref Range   Glucose-Capillary 197 (*) 70 - 99 mg/dL  COMPREHENSIVE METABOLIC PANEL     Status: Abnormal   Collection Time    07/01/13  5:00 AM      Result Value Ref Range   Sodium 138  137 - 147 mEq/L   Potassium 4.4  3.7 - 5.3 mEq/L   Chloride 93 (*)  96 - 112 mEq/L   CO2 36 (*) 19 - 32 mEq/L   Glucose, Bld 184 (*) 70 - 99 mg/dL   BUN 41 (*) 6 - 23 mg/dL   Creatinine, Ser 1.12 (*) 0.50 - 1.10 mg/dL   Calcium 9.1  8.4 - 10.5 mg/dL   Total Protein 6.6  6.0 - 8.3 g/dL   Albumin 2.1 (*) 3.5 - 5.2 g/dL   AST 9  0 - 37 U/L   ALT 9  0 - 35 U/L   Alkaline Phosphatase 76  39 - 117 U/L   Total Bilirubin 0.3  0.3 - 1.2 mg/dL   GFR calc non Af Amer 45 (*) >90 mL/min   GFR calc Af Amer 52 (*) >90 mL/min   Comment: (NOTE)     The eGFR has been calculated using the CKD EPI equation.     This calculation has not been validated in all clinical situations.     eGFR's persistently <90 mL/min signify possible Chronic Kidney     Disease.  MAGNESIUM     Status: None   Collection  Time    07/01/13  5:00 AM      Result Value Ref Range   Magnesium 2.0  1.5 - 2.5 mg/dL  PHOSPHORUS     Status: None   Collection Time    07/01/13  5:00 AM      Result Value Ref Range   Phosphorus 3.8  2.3 - 4.6 mg/dL  CBC     Status: Abnormal   Collection Time    07/01/13  5:00 AM      Result Value Ref Range   WBC 8.9  4.0 - 10.5 K/uL   RBC 2.61 (*) 3.87 - 5.11 MIL/uL   Hemoglobin 7.8 (*) 12.0 - 15.0 g/dL   HCT 24.3 (*) 36.0 - 46.0 %   MCV 93.1  78.0 - 100.0 fL   MCH 29.9  26.0 - 34.0 pg   MCHC 32.1  30.0 - 36.0 g/dL   RDW 24.0 (*) 11.5 - 15.5 %   Platelets 305  150 - 400 K/uL  DIFFERENTIAL     Status: None   Collection Time    07/01/13  5:00 AM      Result Value Ref Range   Neutrophils Relative % 70  43 - 77 %   Lymphocytes Relative 17  12 - 46 %   Monocytes Relative 11  3 - 12 %   Eosinophils Relative 1  0 - 5 %   Basophils Relative 1  0 - 1 %   Neutro Abs 6.2  1.7 - 7.7 K/uL   Lymphs Abs 1.5  0.7 - 4.0 K/uL   Monocytes Absolute 1.0  0.1 - 1.0 K/uL   Eosinophils Absolute 0.1  0.0 - 0.7 K/uL   Basophils Absolute 0.1  0.0 - 0.1 K/uL   RBC Morphology POLYCHROMASIA PRESENT     Comment: SPHEROCYTES     TARGET CELLS  PROTIME-INR     Status: None   Collection Time    07/01/13  5:00 AM      Result Value Ref Range   Prothrombin Time 13.5  11.6 - 15.2 seconds   INR 1.05  0.00 - 1.49  TRIGLYCERIDES     Status: Abnormal   Collection Time    07/01/13  5:00 AM      Result Value Ref Range   Triglycerides 336 (*) <150 mg/dL  GLUCOSE, CAPILLARY     Status: Abnormal  Collection Time    07/01/13  7:54 AM      Result Value Ref Range   Glucose-Capillary 190 (*) 70 - 99 mg/dL    Imaging / Studies: No results found.  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  Plans for laparotomy, hartmans resection with colostomy.  Consult to urology with assist of colovesical fistula.  Spoke with Dr. Junious Silk and notified office at [phone] TNA  NPO  atbx changed to [facility] 3/20  Vanc for cellulitis surrounding the drain  Coumadin on hold.  INR 1.05 today.  Heparin gtt. We requested cardiology clearance. I spoke with Dr. Jeffie Pollock who stated that the recommendations would not change from her previous evaluation in February and re-consult post operatively if needed.  UA and urine culture pending  Hypokalemia-supplement PRN  DM-TPN, CBGs and SSI  Atrial fibrillation-continue to hold coumadin  COPD, home 02-appreciate CCM assistance Chronic diastolic dysfunction-strict I&Os, daily weights CKD -baseline  OSA -CPAP  Hypothyroidism-home meds  Appreciate IM assistance with chronic medical problems  Erby Pian, [facility] Surgery Pager [phone] Office [phone]  07/01/2013 8:45 AM

## 2013-07-01 NOTE — Progress Notes (Signed)
Diverticulitis with colonic and colovesicle fistula. Will need colectomy, colostomy. Appreciate urology evaluation for stents and repair of the bladder. We will coordinate scheduling with them I also D/W Dr. Magnus IvanBlackman who saw her before. Will need to hold heparin 4h prior to surgery. Patient examined and I agree with the assessment and plan  Heidi GelinasBurke Leesa Leifheit, MD, MPH, FACS Trauma: [phone] General Surgery: [phone]  07/01/2013 5:23 PM

## 2013-07-01 NOTE — Progress Notes (Signed)
[facility] PROGRESS NOTE  Nolberto HanlonCynthia A Lohn ZOX:[id number] DOB: 06/27/1930 DOA: 06/30/2013 PCP: Shan LevansPatrick Wright, MD  Assessment/Plan: Diverticulitis with abscess and colocutaneous fistula;  -Continue with IV antibiotics ( Vancomycin, Invanz)  -Management per surgery.  -Plan for surgery 3-23.   Anemia; will defer to surgeon  blood transfusion prior to surgery. Hb at 7.8  Malnutrition, protein caloric;  -continue with TPN. She is also on D 10. Monitor for pulmonary edema.   Hypokalemia; resolved.   Atrial fibrillation: rate controlled.  -Continue with bisoprolol.  -Reversed INR for surgery. received 2 units of FFP and vitamin K.  -INR at 1.1  COPD with chronic resp failure:  - Continue home inhalers  - Chronic prednisone and oxygen supplementation.   CKD Stage IV: Cr range 1.5 to 2.0; stable. Monitor on torsemide.  Cr today at 1.1. Monitor urine Out put.   Hypothyroidism; continue with synthroid.   Chronic diastolic heart failure: compensated at this moment.  -Last EF 60-65%  -Continue with torsemide.  -strict I's and O's, daily weight .   Diabetes; SSI.   Code Status: Full Code.  Family Communication: daughter at bedside. She understand that this is a high risk procedure.  Disposition Plan: remain inpatient.      Antibiotics: Vancomycin 3-20 Invanz 3-20   HPI/Subjective: She is complaining of abdominal pain, they just move me.  She denies chest pain or dyspnea. She just received pain medication.   Objective: Filed Vitals:   07/01/13 0658  BP: 120/62  Pulse: 96  Temp: 98.2 F (36.8 C)  Resp: 18    Intake/Output Summary (Last 24 hours) at 07/01/13 0741 Last data filed at 06/30/13 2206  Gross per 24 hour  Intake   1020 ml  Output   2655 ml  Net  -1635 ml   Filed Weights   06/29/13 0700  Weight: 84 kg (185 lb 3 oz)    Exam:   General:  No distress.   Cardiovascular: S 1, S 2 IRR  Respiratory: CTA  Abdomen: Bs present, left side  drainage in place.   Musculoskeletal: trace edema.    Data Reviewed: Basic Metabolic Panel:  Recent Labs Lab 06/25/13 0500 06/26/13 1130 06/27/13 0555 07/01/2013 1230 06/29/13 0519 06/30/13 0500 07/01/13 0500  NA 147 139 137 140 142 137 138  K 4.1 3.7 3.4* 3.9 3.5* 4.6 4.4  CL 104 98 97 99 99 96 93*  CO2 30 30 29 27  32 31 36*  GLUCOSE 81 168* 210* 218* 250* 152* 184*  BUN 39* 43* 46* 43* 39* 37* 41*  CREATININE 2.10* 1.64* 1.85* 1.34* 1.33* 1.21* 1.12*  CALCIUM 8.2* 8.7 8.3* 8.3* 8.4 8.6 9.1  MG 1.9 1.7  --   --  1.7 1.6 2.0  PHOS 3.2 2.9 3.2  --  2.7 2.7 3.8   Liver Function Tests:  Recent Labs Lab 06/26/13 1130 06/29/13 0519 07/01/13 0500  AST 14 9 9   ALT 13 9 9   ALKPHOS 94 81 76  BILITOT 0.7 0.3 0.3  PROT 5.8* 6.0 6.6  ALBUMIN 1.9* 1.7* 2.1*   No results found for this basename: LIPASE, AMYLASE,  in the last 168 hours No results found for this basename: AMMONIA,  in the last 168 hours CBC:  Recent Labs Lab 06/25/13 0500 06/26/13 1130 06/27/13 0555 06/25/2013 1230 06/29/13 0519 07/01/13 0500  WBC 18.7* 20.1* 16.3* 12.3* 9.7 8.9  NEUTROABS 15.5* 16.5* 12.8*  --  6.9 6.2  HGB 7.3* 9.9* 8.5* 8.1* 8.0* 7.8*  HCT 21.7* 29.0* 25.5* 24.4* 24.9* 24.3*  MCV 84.8 85.5 87.9 89.7 92.9 93.1  PLT 342 321 289 266 281 305   Cardiac Enzymes: No results found for this basename: CKTOTAL, CKMB, CKMBINDEX, TROPONINI,  in the last 168 hours BNP (last 3 results)  Recent Labs  12/05/12 1558 06/06/13 2244 06/22/13 0900  PROBNP 277.0* 1764.0* 5835.0*   CBG:  Recent Labs Lab 06/29/13 2149 06/30/13 0757 06/30/13 1250 06/30/13 1727 06/30/13 2210  GLUCAP 149* 173* 273* 289* 197*    Recent Results (from the past 240 hour(s))  CULTURE, BLOOD (ROUTINE X 2)     Status: None   Collection Time    06/25/13 10:05 PM      Result Value Ref Range Status   Specimen Description BLOOD RIGHT HAND   Final   Special Requests BOTTLES DRAWN AEROBIC ONLY 3CC   Final   Culture   Setup Time     Final   Value: 06/26/2013 01:03     Performed at [facility]   Culture     Final   Value:        BLOOD CULTURE RECEIVED NO GROWTH TO DATE CULTURE WILL BE HELD FOR 5 DAYS BEFORE ISSUING A FINAL NEGATIVE REPORT     Performed at [facility]   Report Status PENDING   Incomplete  CULTURE, BLOOD (ROUTINE X 2)     Status: None   Collection Time    06/25/13 10:10 PM      Result Value Ref Range Status   Specimen Description BLOOD LEFT WRIST   Final   Special Requests BOTTLES DRAWN AEROBIC ONLY 3CC   Final   Culture  Setup Time     Final   Value: 06/26/2013 01:03     Performed at [facility]   Culture     Final   Value:        BLOOD CULTURE RECEIVED NO GROWTH TO DATE CULTURE WILL BE HELD FOR 5 DAYS BEFORE ISSUING A FINAL NEGATIVE REPORT     Performed at [facility]   Report Status PENDING   Incomplete  CLOSTRIDIUM DIFFICILE BY PCR     Status: None   Collection Time    06/27/13  3:11 AM      Result Value Ref Range Status   C difficile by pcr NEGATIVE  NEGATIVE Final     Studies: No results found.  Scheduled Meds: . albuterol  2.5 mg Nebulization Q6H  . bisoprolol  5 mg Oral Daily  . budesonide  0.5 mg Nebulization BID  . ertapenem  1 g Intravenous Q24H  . famotidine  20 mg Oral Daily  . guaiFENesin  400 mg Oral BID  . insulin aspart  0-20 Units Subcutaneous QID  . levothyroxine  50 mcg Oral QAC breakfast  . multivitamin with minerals  1 tablet Oral Daily  . predniSONE  10 mg Oral Q breakfast  . sodium chloride  10-40 mL Intracatheter Q12H  . tiotropium  18 mcg Inhalation Daily  . torsemide  80 mg Oral Daily  . vancomycin  750 mg Intravenous Q24H   Continuous Infusions: . sodium chloride 20 mL/hr (06/30/13 1504)  . Marland KitchenTPN (CLINIMIX-E) Adult 75 mL/hr at 06/30/13 1842   And  . fat emulsion 240 mL (06/30/13 1842)    Active Problems:   Colonic diverticular abscess    Time spent: 35 minutes.     Hartley Barefoot A  [facility] Pager [phone]. If 7PM-7AM, please  contact night-coverage at [email or web address], password [facility] 07/01/2013, 7:41 AM  LOS: 3 days

## 2013-07-01 NOTE — Progress Notes (Signed)
ANTICOAGULATION CONSULT NOTE - Follow Up Consult  Pharmacy Consult for heparin Indication: atrial fibrillation  Allergies  Allergen Reactions  . Sulfonamide Derivatives Other (See Comments)    Childhood reaction    Patient Measurements: Height: 5\' 3"  (160 cm) Weight: 185 lb 3 oz (84 kg) IBW/kg (Calculated) : 52.4 Heparin Dosing Weight: 71kg  Vital Signs: Temp: 98.4 F (36.9 C) (03/23 1449) Temp src: Axillary (03/23 1449) BP: 137/90 mmHg (03/23 1303) Pulse Rate: 93 (03/23 1303)  Labs:  Recent Labs  06/29/13 0519 06/30/13 0500 07/01/13 0500 07/01/13 1700  HGB 8.0*  --  7.8*  --   HCT 24.9*  --  24.3*  --   PLT 281  --  305  --   LABPROT 29.2* 29.7* 13.5  --   INR 2.89* 2.95* 1.05  --   HEPARINUNFRC  --   --   --  0.21*  CREATININE 1.33* 1.21* 1.12*  --     Estimated Creatinine Clearance: 40.4 ml/min (by C-G formula based on Cr of 1.12).   Assessment: [age] YOF on warfarin for AFib which has been held d/t possible surgery. INR down to 1.05 s/p 5mg  of vitamin K and 2 units of FFP yesterday. Pt on heparin bridge. Heparin level 0.21 (subtherapeutic) on 1000 units/hr. Hgb is low but stable. plts WNL. No bleeding noted.  Goal of Therapy:  Heparin level 0.3-0.7 units/ml Monitor platelets by anticoagulation protocol: Yes   Plan:  1. Increase heparin to 1150 units/hr.Start heparin at 1000 units/hr  2. Will f/u 8 hour heparin level 3. Will f/u timing of surgery. MD will need to address when heparin needs to be on hold for surgery. 4. Daily CBC and heparin level  Christoper Fabianaron Corrin Hingle, PharmD, BCPS Clinical pharmacist, pager [phone] 07/01/2013 5:55 PM

## 2013-07-01 NOTE — Progress Notes (Signed)
ANTICOAGULATION CONSULT NOTE - Follow Up Consult  Pharmacy Consult for heparin Indication: atrial fibrillation  Allergies  Allergen Reactions  . Sulfonamide Derivatives Other (See Comments)    Childhood reaction    Patient Measurements: Height: 5\' 3"  (160 cm) Weight: 185 lb 3 oz (84 kg) IBW/kg (Calculated) : 52.4 Heparin Dosing Weight: 71kg  Vital Signs: Temp: 98.2 F (36.8 C) (03/23 0658) Temp src: Oral (03/23 0658) BP: 120/62 mmHg (03/23 0658) Pulse Rate: 96 (03/23 0658)  Labs:  Recent Labs  2014-01-20 1230 06/29/13 0519 06/30/13 0500 07/01/13 0500  HGB 8.1* 8.0*  --  7.8*  HCT 24.4* 24.9*  --  24.3*  PLT 266 281  --  305  LABPROT 24.7* 29.2* 29.7* 13.5  INR 2.32* 2.89* 2.95* 1.05  CREATININE 1.34* 1.33* 1.21* 1.12*    Estimated Creatinine Clearance: 40.4 ml/min (by C-G formula based on Cr of 1.12).   Assessment: [age] [facility] d/t progressive pain and weakness relative to her fistula and drain change. On warfarin for AFib which has been held d/t possible surgery. She is to start heparin when INR is <2. This morning INR is 1- she received 5mg  of vitamin K and 2 units of FFP yesterday. Hgb is low but stable. plts WNL. No bleeding noted. Do not see patient on today's OR schedule yet and RN is unaware of time. Therefore, will start heparin in the interim.  Goal of Therapy:  Heparin level 0.3-0.7 units/ml Monitor platelets by anticoagulation protocol: Yes   Plan:  1. Heparin 3000 unit IV bolus x1 2. Start heparin at 1000 units/hr  3. Follow up plan for resumption of anticoagulation post-op and will obtain heparin level at appropriate time 4. Follow for s/s bleeding  Maansi Wike D. Robert Sperl, PharmD, BCPS Clinical Pharmacist Pager: [phone] 07/01/2013 7:44 AM

## 2013-07-01 NOTE — Progress Notes (Addendum)
PARENTERAL NUTRITION CONSULT NOTE - FOLLOW UP  Pharmacy Consult for TPN Indication: colocutaneous fistula  Allergies  Allergen Reactions  . Sulfonamide Derivatives Other (See Comments)    Childhood reaction    Patient Measurements: Height: 5\' 3"  (160 cm) Weight: 185 lb 3 oz (84 kg) IBW/kg (Calculated) : 52.4 Adjusted Body Weight: 65kg  Vital Signs: Temp: 98.2 F (36.8 C) (03/23 0658) Temp src: Oral (03/23 0658) BP: 120/62 mmHg (03/23 0658) Pulse Rate: 96 (03/23 0658) Intake/Output from previous day: 03/22 0701 - 03/23 0700 In: 1180 [I.V.:480; TPN:690] Out: 3405 [Urine:3400; Drains:5] Intake/Output from this shift:    Labs:  Recent Labs  06/24/2013 1230 06/29/13 0519 06/30/13 0500 07/01/13 0500  WBC 12.3* 9.7  --  8.9  HGB 8.1* 8.0*  --  7.8*  HCT 24.4* 24.9*  --  24.3*  PLT 266 281  --  305  INR 2.32* 2.89* 2.95* 1.05     Recent Labs  06/29/13 0519 06/30/13 0500 07/01/13 0500  NA 142 137 138  K 3.5* 4.6 4.4  CL 99 96 93*  CO2 32 31 36*  GLUCOSE 250* 152* 184*  BUN 39* 37* 41*  CREATININE 1.33* 1.21* 1.12*  CALCIUM 8.4 8.6 9.1  MG 1.7 1.6 2.0  PHOS 2.7 2.7 3.8  PROT 6.0  --  6.6  ALBUMIN 1.7*  --  2.1*  AST 9  --  9  ALT 9  --  9  ALKPHOS 81  --  76  BILITOT 0.3  --  0.3  PREALBUMIN 10.5*  --   --   TRIG 284*  --  336*   Estimated Creatinine Clearance: 40.4 ml/min (by C-G formula based on Cr of 1.12).    Recent Labs  06/30/13 1727 06/30/13 2210 07/01/13 0754  GLUCAP 289* 197* 190*   Insulin Requirements in the past 24 hours:  30 units SSI  Current Nutrition:  Clinimix E 5/15 at 20mL/hr + lipid 20% emulsion at 64mL/hr- this will provide 72g protein and 1584kcal daily  Nutritional Goals:  1600-1800 kCal, 80-95 grams of protein per day per RD recs on 3/21  Admit:  83 YOF recently hospitaliazed for PNA and flu and developed diverticulitis with abscess which required drain placement. This week her drain was attempted to be upsized d/t  stool in abscess cavity and she continued to have worsening pain and weakness.   GI: has been NPO except for medications; IR does not recommend repositioning current drain, an additional drain could be placed if patient does not have surgery. Drain output 1219mL/24; surgery evaluated and is planning laparotomy and Hartmann resection early this week. PO Pepcid, PO multivitamin. Prealbumin was 13.2 on 3/18- this admission it decreased to 10.5.  Endo: hx hypothyroid, TSH 06/22/2013 1.9, continues on Synthroid 50; hx DM- 06/22/2013 A1C 7%. CBGs 149-250 last 24 hours- appears as though patient's CBGs were higher when she was on D10 as opposed to when TPN started; she is also on prednisone  Lytes: Na 138, K 4.4, Phos 3.8, Mag 2- no indication of refeeding  Renal: hx CKD; SCr decreased to 1.12 with est CrCL ~71mL/min; urinated 3x in last 24 hours  Pulm: 96/2L [place]; Spiriva  Cards: VSS on bisoprolol, torsemide and scheduled KCl daily; cards to evaluate prior to surgery  Hepatobil: LFTs wnl except albumin is low at 2.1. Baseline trigs elevated at 284, increased further this morning to 336  Neuro:  A&O  ID: was on Flagyl and tygecycline at [facility], changed to ertapenem  and vancomycin here- D#4 for both of those. WBC 8.9, afebrile for last 24 hours  Best Practices: heparin started this morning as INR <2 now.  TPN Access: R PICC TPN day#: 6 (started 3/17 in [facility])  Plan:  1. Continue Clinimix E 5/15 at 1775mL/hr. Change lipid 20% emulsion to8210mL/hr on MWF only as triglycerides have been climbing. She was receiving lipids 3x weekly at [facility] - this will provide 90g protein daily and an average of 1790kcal daily. 2. As CBGs continued to climb, will add 20 units regular insulin to TPN bag (2/3 of amount received from SSI in past 24 hours) 3. NO multivitamin in TPN as patient is receiving daily as PO- will reassess if this changes 4. Trace elements MWF d/t national backorder 5. [facility]  6. CBGs and resistant SSI q4h  7. BMET, mag, phos and trigs tomorrow morning  Srinivas Lippman D. Brittain Smithey, PharmD, BCPS Clinical Pharmacist Pager: [phone] 07/01/2013 9:01 AM

## 2013-07-02 ENCOUNTER — Other Ambulatory Visit: Payer: Self-pay | Admitting: Urology

## 2013-07-02 ENCOUNTER — Inpatient Hospital Stay (HOSPITAL_COMMUNITY): Payer: Medicare Other

## 2013-07-02 LAB — CULTURE, BLOOD (ROUTINE X 2)
CULTURE: NO GROWTH
Culture: NO GROWTH

## 2013-07-02 LAB — CBC
HCT: 25 % — ABNORMAL LOW (ref 36.0–46.0)
HEMOGLOBIN: 7.9 g/dL — AB (ref 12.0–15.0)
MCH: 29.5 pg (ref 26.0–34.0)
MCHC: 31.6 g/dL (ref 30.0–36.0)
MCV: 93.3 fL (ref 78.0–100.0)
Platelets: 337 10*3/uL (ref 150–400)
RBC: 2.68 MIL/uL — ABNORMAL LOW (ref 3.87–5.11)
RDW: 24.1 % — ABNORMAL HIGH (ref 11.5–15.5)
WBC: 10 10*3/uL (ref 4.0–10.5)

## 2013-07-02 LAB — BASIC METABOLIC PANEL
BUN: 46 mg/dL — ABNORMAL HIGH (ref 6–23)
CO2: 35 meq/L — AB (ref 19–32)
CREATININE: 1.13 mg/dL — AB (ref 0.50–1.10)
Calcium: 9.2 mg/dL (ref 8.4–10.5)
Chloride: 87 mEq/L — ABNORMAL LOW (ref 96–112)
GFR calc Af Amer: 51 mL/min — ABNORMAL LOW (ref >90)
GFR calc non Af Amer: 44 mL/min — ABNORMAL LOW (ref >90)
GLUCOSE: 120 mg/dL — AB (ref 70–99)
Potassium: 4.3 mEq/L (ref 3.7–5.3)
Sodium: 134 mEq/L — ABNORMAL LOW (ref 137–147)

## 2013-07-02 LAB — HEPARIN LEVEL (UNFRACTIONATED)
HEPARIN UNFRACTIONATED: 0.33 [IU]/mL (ref 0.30–0.70)
Heparin Unfractionated: 0.13 IU/mL — ABNORMAL LOW (ref 0.30–0.70)

## 2013-07-02 LAB — TRIGLYCERIDES: Triglycerides: 313 mg/dL — ABNORMAL HIGH (ref <150)

## 2013-07-02 LAB — VANCOMYCIN, TROUGH: Vancomycin Tr: 18.6 ug/mL (ref 10.0–20.0)

## 2013-07-02 LAB — GLUCOSE, CAPILLARY
GLUCOSE-CAPILLARY: 130 mg/dL — AB (ref 70–99)
Glucose-Capillary: 119 mg/dL — ABNORMAL HIGH (ref 70–99)
Glucose-Capillary: 143 mg/dL — ABNORMAL HIGH (ref 70–99)
Glucose-Capillary: 203 mg/dL — ABNORMAL HIGH (ref 70–99)
Glucose-Capillary: 244 mg/dL — ABNORMAL HIGH (ref 70–99)
Glucose-Capillary: 182 mg/dL — ABNORMAL HIGH (ref 70–99)

## 2013-07-02 LAB — PHOSPHORUS: Phosphorus: 4 mg/dL (ref 2.3–4.6)

## 2013-07-02 LAB — MAGNESIUM: Magnesium: 1.9 mg/dL (ref 1.5–2.5)

## 2013-07-02 MED ORDER — FUROSEMIDE 10 MG/ML IJ SOLN
20.0000 mg | Freq: Once | INTRAMUSCULAR | Status: AC
  Filled 2013-07-02: qty 2

## 2013-07-02 MED ORDER — FUROSEMIDE 10 MG/ML IJ SOLN
INTRAMUSCULAR | Status: AC
  Administered 2013-07-02: 20 mg
  Filled 2013-07-02: qty 4

## 2013-07-02 MED ORDER — ALBUTEROL SULFATE (2.5 MG/3ML) 0.083% IN NEBU
2.5000 mg | INHALATION_SOLUTION | Freq: Two times a day (BID) | RESPIRATORY_TRACT | Status: DC
  Administered 2013-07-03: 2.5 mg via RESPIRATORY_TRACT
  Filled 2013-07-02: qty 3

## 2013-07-02 MED ORDER — VANCOMYCIN HCL 500 MG IV SOLR
500.0000 mg | INTRAVENOUS | Status: DC
  Administered 2013-07-03 – 2013-07-06 (×4): 500 mg via INTRAVENOUS
  Filled 2013-07-02 (×4): qty 500

## 2013-07-02 MED ORDER — FLUCONAZOLE IN SODIUM CHLORIDE 200-0.9 MG/100ML-% IV SOLN
200.0000 mg | INTRAVENOUS | Status: DC
Start: 2013-07-02 — End: 2013-07-06
  Administered 2013-07-02 – 2013-07-05 (×3): 200 mg via INTRAVENOUS
  Filled 2013-07-02 (×5): qty 100

## 2013-07-02 MED ORDER — INSULIN REGULAR HUMAN 100 UNIT/ML IJ SOLN
INTRAVENOUS | Status: AC
  Administered 2013-07-02: 17:00:00 via INTRAVENOUS
  Filled 2013-07-02: qty 2000

## 2013-07-02 NOTE — Progress Notes (Addendum)
ANTIBIOTIC CONSULT NOTE - INITIAL  Pharmacy Consult for Vancomycin Indication: cellulitis  Allergies  Allergen Reactions  . Sulfonamide Derivatives Other (See Comments)    Childhood reaction    Patient Measurements: Height: 5\' 3"  (160 cm) Weight: 185 lb 3 oz (84 kg) IBW/kg (Calculated) : 52.4  Vital Signs: Temp: 98.6 F (37 C) (03/23 2104) Temp src: Oral (03/23 2104) BP: 132/74 mmHg (03/23 2104) Pulse Rate: 95 (03/23 2104) Intake/Output from previous day: 03/23 0701 - 03/24 0700 In: 1267.7 [I.V.:187.7; IV Piggyback:200; TPN:880] Out: 1500 [Urine:1475; Drains:25] Intake/Output from this shift: Total I/O In: -  Out: 300 [Urine:300]  Labs:  Recent Labs  06/29/13 0519 06/30/13 0500 07/01/13 0500  WBC 9.7  --  8.9  HGB 8.0*  --  7.8*  PLT 281  --  305  CREATININE 1.33* 1.21* 1.12*   Estimated Creatinine Clearance: 40.4 ml/min (by C-G formula based on Cr of 1.12).  Recent Labs  07/01/13 2355  VANCOTROUGH 18.6    Assessment: 80 yo female with diverticular abcess s/p drain 2/27, now with surrounding cellulitis for empiric antibiotics  Goal of Therapy:  Vancomycin trough level 10-15 mcg/ml  Plan:  Change vancomycin 500 mg IV q24h  Eddie Candlebbott, Mariene Dickerman [place] 07/02/2013,2:24 AM

## 2013-07-02 NOTE — Progress Notes (Deleted)
Name: [name] MRN: [id number] DOB: 10/06/1930    ADMISSION DATE:  06/17/2013 CONSULTATION DATE:  3/22  REFERRING MD :  Derrell LollingIngram  PRIMARY SERVICE:  CCS  CHIEF COMPLAINT:  Diverticulitis w/ abscess, h/o chronic resp failure/ request for pre-op pulm eval  BRIEF PATIENT DESCRIPTION:   80 year old female admitted on 3/20 for Diverticular abscess. Surgery planning on Laparotomy, [name] rsxn and colostomy for 3/23. PCCM asked to see pre-op in setting of GOLD stage C COPD and prob element of OHS.   SIGNIFICANT EVENTS / STUDIES:  CT abd 3/19: Persistent abscess collections involving the left iliopsoas and sigmoid mesial colon, slightly larger than on previous exam.   LINES / TUBES:   CULTURES:   ANTIBIOTICS:   HISTORY OF PRESENT ILLNESS:   80 year old female who was recently the hospital with pneumonia and the flu who subsequently developed diverticulitis with diverticular abscess. A drain was placed. There was stool leaking from the abscess cavity. She has had her drain attempted to be upsized and continued to have worsening pain over the week of 3/20. She has continued to become weaker and unable to get out of bed. She had significant pain and cellulitis of the drain insertion site. Denied fevers and chills. She has also been on antibiotics throughout this time. She's been on TNA and has been n.p.o. other than her pills. Was admitted on 3/20 w/ hope that her drain might be able to be re-positioned by IR. After CT review it was felt that repositioning was not indicated, however either surgery OR placement of second drain if surgery was not a option. Based on this it was felt that the best option was to go ahead w/ surgery. She is planned for  Laparotomy, [name] rsxn and colostomy for 3/23. PCCM asked to see pre-op in setting of GOLD stage C COPD and prob element of OHS.     PAST MEDICAL HISTORY :  Past Medical History  Diagnosis Date  . Diabetes mellitus   . Obesity   . COPD  (chronic obstructive pulmonary disease)     Moderate. PFTs (12/10): FVC 67%, FEV1 58%, ratio 60%, TLC 95%, RV 138%, DLCO 43% (moderate obstructive defect). She is on home oxygen that should be worn at all times.  . Diastolic CHF, chronic     Echo (3/12): EF 55-60%, mild LV hypertrophy, grade I diastolic dysfunction, mild left atrial enlargement, normal pulmonary artery pressure  . CKD (chronic kidney disease)     Cr 2.4 when last checked. ANCA +, pauci-immune glomerulnephritis followed by Dr. Arrie Aranoladonato  . PNA (pneumonia)     h/o with ARDS in 2005; [facility]  . Diverticulum     Jejunal  . OSA (obstructive sleep apnea)     possible. When pt was sedated for TEE in May 2010, she did develop some upper airway obstruction  . Anemia     of chronic disease  . Hypothyroidism   . Gout   . Atrial fibrillation 07/2008    Failed cardioversion with TEE guidance in May 2010. Pt went back to NSR by 11/2008 with amiodarne  . Hypertension   . GERD (gastroesophageal reflux disease)   . Peripheral vascular disease   . Pneumonia   . Chronic kidney disease   . Respiratory failure, chronic 07/01/2011  . Chronic gout due to renal impairment 07/01/2011   Past Surgical History  Procedure Laterality Date  . Tracheostomy      ARDS/  ICU admission, trach 2005  . Microperforation  08/2008    Of jejunem, ICU admission   Prior to Admission medications   Medication Sig Start Date End Date Taking? Authorizing Provider  Aclidinium Bromide (TUDORZA PRESSAIR) 400 MCG/ACT AEPB Inhale 1 puff into the lungs 2 (two) times daily.   Yes Historical Provider, MD  albuterol (PROVENTIL) (2.5 MG/3ML) 0.083% nebulizer solution Take 3 mLs (2.5 mg total) by nebulization 2 (two) times daily. Dx: 492.8 COPD with Emphysema 04/15/13  Yes Storm FriskPatrick E Wright, MD  Amino Acids-Protein Hydrolys (FEEDING SUPPLEMENT, PRO-STAT SUGAR FREE 64,) LIQD Take 30 mLs by mouth 2 (two) times daily.    Yes Historical Provider, MD  bisoprolol (ZEBETA) 10 MG tablet Take 5 mg by mouth daily.   Yes Historical Provider, MD  budesonide (PULMICORT) 0.5 MG/2ML nebulizer solution Take 0.5 mg by nebulization 2 (two) times daily.   Yes Historical Provider, MD  famotidine (PEPCID) 20 MG tablet Take 20 mg by mouth daily.   Yes Historical Provider, MD  guaiFENesin (MUCINEX) 600 MG 12 hr tablet Take 600 mg by mouth 2 (two) times daily.   Yes Historical Provider, MD  insulin aspart (NOVOLOG) 100 UNIT/ML injection Inject into the skin every 6 (six) hours. Sliding scale   Yes Historical Provider, MD  levothyroxine (SYNTHROID, LEVOTHROID) 50 MCG tablet Take 50 mcg by mouth daily.   Yes Historical Provider, MD  metroNIDAZOLE (FLAGYL) 5-0.79 MG/ML-% IVPB Inject 500 mg into the vein every 6 (six) hours.   Yes Historical Provider, MD  Multiple Vitamins-Minerals (MULTIVITAMINS THER. W/MINERALS) TABS tablet Take 1 tablet by mouth daily.   Yes Historical Provider, MD  oxyCODONE (OXY IR/ROXICODONE) 5 MG immediate release tablet Take 5 mg by mouth every 12 (twelve) hours.   Yes Historical Provider, MD  Probiotic Product (RISA-BID PROBIOTIC PO) Take 1 capsule by mouth 2 (two) times daily.   Yes Historical Provider, MD  tigecycline (TYGACIL) 50 MG injection Inject 50 mg into the vein 2 (two) times daily.   Yes Historical Provider, MD  torsemide (DEMADEX) 20 MG tablet Take 80 mg by mouth daily.   Yes Historical Provider, MD  barrier cream (NON-SPECIFIED) CREA Apply 1 application topically 3 (three) times daily as needed (place around drain area to keep it dry and clean). 06/20/13   Vassie Lollarlos Madera, MD   Allergies  Allergen Reactions  . Sulfonamide Derivatives Other (See Comments)    Childhood reaction    FAMILY HISTORY:  Family History  Problem Relation Age of Onset  . Emphysema Mother     and asthma  . Asthma Mother   . Heart disease Neg Hx     premature   SOCIAL HISTORY:  reports that she quit smoking about 37  years ago. Her smoking use included Cigarettes. She has a 23 pack-year smoking history. She has never used smokeless tobacco. She reports that she drinks alcohol. She reports that she does not use illicit drugs.  REVIEW OF SYSTEMS:   Constitutional: Negative for fever, chills, weight loss, malaise/fatigue and diaphoresis.  HENT: Negative for hearing loss, ear pain, nosebleeds, congestion, sore throat, neck pain, tinnitus and ear discharge.   Eyes: Negative for blurred vision, double vision, photophobia, pain, discharge and redness.  Respiratory: Negative for cough, hemoptysis, sputum production,only recently, but no color, no shortness of breath, wheezing and stridor.   Cardiovascular: Negative for chest pain, palpitations, orthopnea, claudication, leg swelling and PND.  Gastrointestinal: Negative for heartburn, nausea, vomiting, abdominal pain, diarrhea, constipation, blood in stool and melena.  Genitourinary: Negative for dysuria, urgency, frequency, hematuria and flank pain.  Musculoskeletal: Negative for myalgias, back pain, joint pain and falls.  Skin: Negative for itching and rash.  Neurological: Negative for dizziness, tingling, tremors, sensory change, speech change, focal weakness, seizures, loss of consciousness, weakness and headaches.  Endo/Heme/Allergies: Negative for environmental allergies and polydipsia. Does not bruise/bleed easily.  SUBJECTIVE:   VITAL SIGNS: Temp:  [97.9 F (36.6 C)-98.6 F (37 C)] 98.3 F (36.8 C) (03/24 0451) Pulse Rate:  [93-98] 98 (03/24 0451) Resp:  [18] 18 (03/24 0451) BP: (132-141)/(59-90) 141/59 mmHg (03/24 0451) SpO2:  [95 %-99 %] 98 % (03/24 0802) Weight:  [81.874 kg (180 lb 8 oz)] 81.874 kg (180 lb 8 oz) (03/24 0451)  PHYSICAL EXAMINATION: General:  No distress Neuro:  Awake and alert HEENT:  No JVD Neck:  supple Cardiovascular:  RRR nl s1 s2  Lungs:  Rhonchi throughout Abdomen:  Soft NT Musculoskeletal:  From  Skin:   clear   Recent Labs Lab 06/30/13 0500 07/01/13 0500 07/02/13 0455  NA 137 138 134*  K 4.6 4.4 4.3  CL 96 93* 87*  CO2 31 36* 35*  BUN 37* 41* 46*  CREATININE 1.21* 1.12* 1.13*  GLUCOSE 152* 184* 120*    Recent Labs Lab 06/29/13 0519 07/01/13 0500 07/02/13 0455  HGB 8.0* 7.8* 7.9*  HCT 24.9* 24.3* 25.0*  WBC 9.7 8.9 10.0  PLT 281 305 337   Dg Chest Port 1 View  07/02/2013   CLINICAL DATA:  Cough and congestion.  EXAM: PORTABLE CHEST - 1 VIEW  COMPARISON:  06/27/2013  FINDINGS: PICC line tip in the SVC region. Again noted is volume loss in the left hemithorax. There are enlarged interstitial densities bilaterally. Findings suggest interstitial edema. Persistent densities at the left lung base appear to be chronic and related to volume loss and possibly cardiophrenic fat. However, there is haziness at the right lung base and small pleural effusions cannot be excluded.  IMPRESSION: Prominent interstitial lung markings suggestive interstitial pulmonary edema.  Densities at the lung bases and cannot exclude small pleural effusions, particularly on the right side.   Electronically Signed   By: Richarda Overlie M.D.   On: 07/02/2013 08:21    ASSESSMENT / PLAN: Chronic respiratory failure in setting of GOLD Stage C COPD and element of OHS:  PFTs (12/10): FVC 67%, FEV1 58%, ratio 60%, TLC 95%, RV 138%, DLCO 43% (moderate obstructive defect). On home oxygen.  Possible OSA-->per records negative sleep study but had recorded apnea during conscious sedation in past  For TEE in 2010 -Given pulmonary co-morbids she is at significant risk for pulmonary complications such as: prolonged vent dependence, and post-op pulmonary complications, however these risks are not a absolute contraindication for surgery.  Pulmonary Edema secondary to Acute on chronic diastolic CHF.   Plan/rec:  -would recover in either ICU -Diurese per primary team -cont routine BDs (will escalate for post-op) -IS/flutter -we  will see post-op   Diastolic congestive heart failure. Echo (11/13) with EF 60-65%, mild LVH, moderate biatrial enlargement.  H/o Afib on coumadin   Plan/rec Cont post-op bb  Holding coumadin for INR to normalize Will need post-op tele  Chronic kidney disease: h/o ANCA+, pauci-immune glomerulonephritis. Scr actually better than baseline (on average cr running 1.8-2.1) now 1.34 Plan: Avoid hypotension  Renal dose meds   DM Plan: ssi   Will follow up with her post operatively, which is tentatively scheduled for today or tomorrow. She will likely need to  recover in ICU in which case PCCM would assume care.   Joneen Roach, ACNP [facility] Pulmonology/Critical Care Pager [phone] or [phone]

## 2013-07-02 NOTE — Progress Notes (Signed)
ANTICOAGULATION CONSULT NOTE - Follow Up Consult  Pharmacy Consult for Heparin  Indication: atrial fibrillation  Allergies  Allergen Reactions  . Sulfonamide Derivatives Other (See Comments)    Childhood reaction    Patient Measurements: Height: 5\' 3"  (160 cm) Weight: 180 lb 8 oz (81.874 kg) IBW/kg (Calculated) : 52.4  Vital Signs: Temp: 98.3 F (36.8 C) (03/24 0451) Temp src: Oral (03/24 0451) BP: 141/59 mmHg (03/24 0451) Pulse Rate: 98 (03/24 0451)  Labs:  Recent Labs  06/30/13 0500 07/01/13 0500 07/01/13 1700 07/02/13 0455 07/02/13 0500  HGB  --  7.8*  --  7.9*  --   HCT  --  24.3*  --  25.0*  --   PLT  --  305  --  337  --   LABPROT 29.7* 13.5  --   --   --   INR 2.95* 1.05  --   --   --   HEPARINUNFRC  --   --  0.21*  --  0.13*  CREATININE 1.21* 1.12*  --   --   --     Estimated Creatinine Clearance: 39.9 ml/min (by C-G formula based on Cr of 1.12).  Medications:  Heparin 1150 units/hr  Assessment: 80 y/o F on warfarin for afib. HL is low at 0.13. Other labs as above. No issues per RN. MD still working out plans for surgery.   Goal of Therapy:  Heparin level 0.3-0.7 units/ml Monitor platelets by anticoagulation protocol: Yes   Plan:  -Increase heparin to 1350 units/hr -1500 HL -Daily CBC/HL -Monitor for bleeding -F/U surgery plans  Abran DukeLedford, Gissel Keilman 07/02/2013,6:32 AM

## 2013-07-02 NOTE — Progress Notes (Signed)
PARENTERAL NUTRITION CONSULT NOTE - FOLLOW UP  Pharmacy Consult for TPN Indication: colocutaneous fistula  Allergies  Allergen Reactions  . Sulfonamide Derivatives Other (See Comments)    Childhood reaction    Patient Measurements: Height: 5\' 3"  (160 cm) Weight: 180 lb 8 oz (81.874 kg) IBW/kg (Calculated) : 52.4 Adjusted Body Weight: 65kg  Vital Signs: Temp: 98.3 F (36.8 C) (03/24 0451) Temp src: Oral (03/24 0451) BP: 141/59 mmHg (03/24 0451) Pulse Rate: 98 (03/24 0451) Intake/Output from previous day: 03/23 0701 - 03/24 0700 In: 1614.7 [I.V.:484.7; IV Piggyback:250; TPN:880] Out: 2025 [Urine:2000; Drains:25] Intake/Output from this shift:    Labs:  Recent Labs  06/30/13 0500 07/01/13 0500 07/02/13 0455  WBC  --  8.9 10.0  HGB  --  7.8* 7.9*  HCT  --  24.3* 25.0*  PLT  --  305 337  INR 2.95* 1.05  --      Recent Labs  06/30/13 0500 07/01/13 0500 07/02/13 0455  NA 137 138 134*  K 4.6 4.4 4.3  CL 96 93* 87*  CO2 31 36* 35*  GLUCOSE 152* 184* 120*  BUN 37* 41* 46*  CREATININE 1.21* 1.12* 1.13*  CALCIUM 8.6 9.1 9.2  MG 1.6 2.0 1.9  PHOS 2.7 3.8 4.0  PROT  --  6.6  --   ALBUMIN  --  2.1*  --   AST  --  9  --   ALT  --  9  --   ALKPHOS  --  76  --   BILITOT  --  0.3  --   TRIG  --  336* 313*   Estimated Creatinine Clearance: 39.6 ml/min (by C-G formula based on Cr of 1.13).    Recent Labs  07/02/13 0056 07/02/13 0450 07/02/13 0756  GLUCAP 130* 119* 143*   Insulin Requirements in the past 24 hours:  13 units SSI  Current Nutrition:  Clinimix E 5/15 at 109mL/hr + 20% lipid emulsion at 45mL/hr on MWF only- this will provide an average of 90 gm protein and 1484 kCal daily  Nutritional Goals:  1600-1800 kCal, 80-95 grams of protein per day per RD recs on 3/21  Admit:  83 YOF recently hospitaliazed for PNA and flu and developed diverticulitis with abscess which required drain placement. This week her drain was attempted to be upsized d/t  stool in abscess cavity and she continued to have worsening pain and weakness.   GI: has been NPO except for medications; IR does not recommend repositioning current drain, an additional drain could be placed if patient does not have surgery. Drain output decreasing; surgery evaluated and is planning laparotomy and Hartmann resection this week. PO Pepcid, PO multivitamin. Prealbumin was 13.2 on 3/18- this admission it decreased to 10.5.  Endo: hx hypothyroid, TSH 06/22/2013 1.9, continues on Synthroid 50; hx DM- 06/22/2013 A1C 7%. CBGs improved; she is also on prednisone  Lytes: Na 138, K 4.4, Phos 3.8, Mag 2- no indication of refeeding  Renal: hx CKD; SCr stable  Pulm: 3L [place]; Spiriva  Cards: VSS on bisoprolol, torsemide  Hepatobil: LFTs wnl except albumin is low at 2.1. Triglycerides remain elevated at 313.  Lipid provisions were adjusted 3/23.  Neuro:  A&O  ID: was on Flagyl and tigecycline at [facility], changed to ertapenem and vancomycin here- D#5 for both of those. WBC 10, afebrile  Best Practices: heparin infusion for atrial fibrillation while Coumadin is on hold.  TPN Access: R PICC  TPN day#: 7 (started 3/17 in [facility])  Plan:  Continue Clinimix E 5/15 at 8475mL/hr. Continue 20% lipid emulsion at 8010mL/hr on MWF only as triglycerides remain elevated.  This will provide an average of 90 gm protein and 1484 kCal per day. Continue with 20 units of insulin in TPN bag. NO multivitamin or trace elements in TPN as patient is receiving an oral tablet [facility]  [facility] and resistant SSI q4h  CMET tomorrow morning  Estella HuskMichelle Bertha Lokken, Pharm.D., BCPS, AAHIVP Clinical Pharmacist Phone: [phone] or [phone] 07/02/2013, 9:26 AM

## 2013-07-02 NOTE — Progress Notes (Signed)
Less erythema at drain site. Working on coordination with urology. I spoke to her daughter again. Patient examined and I agree with the assessment and plan  Violeta GelinasBurke Maudene Stotler, MD, MPH, FACS Trauma: [phone] General Surgery: [phone]  07/02/2013 11:48 AM

## 2013-07-02 NOTE — Progress Notes (Signed)
NUTRITION FOLLOW UP  DOCUMENTATION CODES  Per approved criteria   -Obesity Unspecified    Intervention:    TPN dosing per Pharmacy to meet >90% of estimated nutrition needs as able.   May be unable to meet nutrition goals due to premixed Clinimix solution.  Nutrition Dx:   Inadequate oral intake related to inability to eat as evidenced by NPO status, ongoing  Goal:   Patient to meet >/=90% of estimated nutrition needs, met  Monitor:   TPN tolerance/adequacy, weight trend, labs, ability to transition to enteral nutrition vs PO diet  Assessment:   Patient is an 80 year old female who was recently in the hospital with pneumonia and the flu who subsequently developed diverticulitis with diverticular abscess. A drain was placed. There was stool leaking from the abscess cavity. She has continued to become weaker and unable to get out of bed. She's been on TPN and has been n.p.o. other than her pills. She was transferred in from [facility].  From nutrition note on 3/21:  At [facility], was on Clinimix 4.25/25 at 8m/hr with NO lipids. Weight was down to 177 lb at the beginning of last hospitalization (3/6). Weight now up to 185 lb, likely partially related to fluid status.  Per pharmacy note, Clinimix E 5/15 at 75 ml/hr. 20% lipid emulsion at 10 ml/hr are being provided 3 times weekly (MWF) due to elevated triglycerides. Current TPN prescription provides an average of 90 grams protein (113% minimum estimated needs) and 1484 kcal (93% minimum estimated needs) daily.  Patient's weight trending down, current weight 180 lb. Patient net - 3 L since admission.   Lipid Panel     Component Value Date/Time   CHOL 106 07/25/2012 0822   TRIG 313* 07/02/2013 0455   HDL 21.20* 07/25/2012 0822   CHOLHDL 5 07/25/2012 0822   VLDL 54.4* 07/25/2012 0822   LDLCALC See Comment mg/dL 07/23/2009 0000    Sodium low at 134 mEq/L, patient receiving sodium chloride  Chloride low at 87 mEq/L CO2  elevated at 35 mEq/L Bun elevated at 4.6 mg/dL Creatinine elevated at 1.13 mg/dL CBGs elevated at 119, 143, and 203 mg/dL Potassium, Magnesium, Phosphorous WNL  Height: Ht Readings from Last 1 Encounters:  06/29/13 _0  (1.6 m)    Weight Status:   Wt Readings from Last 1 Encounters:  07/02/13 180 lb 8 oz (81.874 kg)    Re-estimated needs:  Kcal: 1600-1800  Protein: 80-95 gm  Fluid: 1.8-2 L  Skin: Stage II pressure ulcer on buttocks, closed abdominal incision, closed system abdominal drain  Diet Order: NPO   Intake/Output Summary (Last 24 hours) at 07/02/13 1224 Last data filed at 07/02/13 1139  Gross per 24 hour  Intake 1614.67 ml  Output   2425 ml  Net -810.33 ml    Last BM: PTA   Labs:   Recent Labs Lab 06/30/13 0500 07/01/13 0500 07/02/13 0455  NA 137 138 134*  K 4.6 4.4 4.3  CL 96 93* 87*  CO2 31 36* 35*  BUN 37* 41* 46*  CREATININE 1.21* 1.12* 1.13*  CALCIUM 8.6 9.1 9.2  MG 1.6 2.0 1.9  PHOS 2.7 3.8 4.0  GLUCOSE 152* 184* 120*    CBG (last 3)   Recent Labs  07/02/13 0450 07/02/13 0756 07/02/13 1155  GLUCAP 119* 143* 203*    Scheduled Meds: . albuterol  2.5 mg Nebulization TID  . bisoprolol  5 mg Oral Daily  . budesonide  0.5 mg Nebulization BID  .  ertapenem  1 g Intravenous Q24H  . famotidine  20 mg Oral Daily  . fluconazole (DIFLUCAN) IV  200 mg Intravenous Q24H  . guaiFENesin  400 mg Oral BID  . insulin aspart  0-20 Units Subcutaneous 6 times per day  . levothyroxine  50 mcg Oral QAC breakfast  . multivitamin with minerals  1 tablet Oral Daily  . predniSONE  10 mg Oral Q breakfast  . sodium chloride  10-40 mL Intracatheter Q12H  . tiotropium  18 mcg Inhalation Daily  . torsemide  80 mg Oral Daily  . [START ON 06/22/2013] vancomycin  500 mg Intravenous Q24H    Continuous Infusions: . sodium chloride 20 mL/hr (06/30/13 1504)  . Marland KitchenTPN (CLINIMIX-E) Adult 75 mL/hr at 07/01/13 1702   And  . fat emulsion 240 mL (07/01/13 1702)   . heparin 1,350 Units/hr (07/02/13 0640)  . Marland KitchenTPN (CLINIMIX-E) Adult      Claudell Kyle, Dietetic Intern Pager: [phone]  I agree with the Student-Dietitian note and made appropriate revisions.  Arthur Holms, RD, LDN Pager #: [phone] After-Hours Pager #: [phone]

## 2013-07-02 NOTE — Progress Notes (Signed)
[facility] PROGRESS NOTE  UNNAMED HINO OZH:[id number] DOB: [birth year] DOA: 07/02/2013 PCP: Shan Levans, MD  Assessment/Plan: Diverticulitis with abscess and colocutaneous fistula, colovesical fistula  -Continue with IV antibiotics ( Vancomycin, Invanz)  -Management per general surgery and urologist.  Christean Grief when surgery will be.   Anemia; will defer to surgeon  blood transfusion prior to surgery. Hb at 7.8  Malnutrition, protein caloric;  -continue with TPN.   Hypokalemia; resolved.   Atrial fibrillation: rate controlled.  -Continue with bisoprolol.  -Reversed INR for surgery. received 2 units of FFP and vitamin K.  -INR at 1.1  COPD with chronic resp failure:  - Continue home inhalers  - Chronic prednisone and oxygen supplementation.   CKD Stage IV: Cr range 1.5 to 2.0; stable. Monitor on torsemide.  Cr today at 1.1. urine Out put;2. L.    Hypothyroidism; continue with synthroid.   Chronic diastolic heart failure: compensated at this moment.  -Last EF 60-65%  -Continue with torsemide.  -strict I's and O's, daily weight .  -Will give one time dose lasix.  -Chest x ray 3-24; Prominent interstitial lung markings suggestive interstitial  pulmonary edema. -Agree with stoping D 10 IV fluids.   Diabetes; SSI.    Code Status: Full Code.  Family Communication: daughter at bedside. She understand that this is a high risk procedure.  Disposition Plan: remain inpatient.      Antibiotics: Vancomycin 3-20 Invanz 3-20   HPI/Subjective: Relates left side abdominal pain. Denies worsening dyspnea. She has productive cough.   Objective: Filed Vitals:   07/02/13 0451  BP: 141/59  Pulse: 98  Temp: 98.3 F (36.8 C)  Resp: 18    Intake/Output Summary (Last 24 hours) at 07/02/13 0828 Last data filed at 07/02/13 0451  Gross per 24 hour  Intake 1614.67 ml  Output   2025 ml  Net -410.33 ml   Filed Weights   06/29/13 0700 07/01/13 1028 07/02/13 0451   Weight: 84 kg (185 lb 3 oz) 84 kg (185 lb 3 oz) 81.874 kg (180 lb 8 oz)    Exam:   General:  No distress.   Cardiovascular: S 1, S 2 IRR  Respiratory: bilateral ronchus, decreases breath sounds.   Abdomen: Bs present, left side drainage in place.   Musculoskeletal: trace edema.    Data Reviewed: Basic Metabolic Panel:  Recent Labs Lab 06/26/13 1130 06/27/13 0555 06/10/2013 1230 06/29/13 0519 06/30/13 0500 07/01/13 0500 07/02/13 0455  NA 139 137 140 142 137 138 134*  K 3.7 3.4* 3.9 3.5* 4.6 4.4 4.3  CL 98 97 99 99 96 93* 87*  CO2 30 29 27  32 31 36* 35*  GLUCOSE 168* 210* 218* 250* 152* 184* 120*  BUN 43* 46* 43* 39* 37* 41* 46*  CREATININE 1.64* 1.85* 1.34* 1.33* 1.21* 1.12* 1.13*  CALCIUM 8.7 8.3* 8.3* 8.4 8.6 9.1 9.2  MG 1.7  --   --  1.7 1.6 2.0 1.9  PHOS 2.9 3.2  --  2.7 2.7 3.8 4.0   Liver Function Tests:  Recent Labs Lab 06/26/13 1130 06/29/13 0519 07/01/13 0500  AST 14 9 9   ALT 13 9 9   ALKPHOS 94 81 76  BILITOT 0.7 0.3 0.3  PROT 5.8* 6.0 6.6  ALBUMIN 1.9* 1.7* 2.1*   No results found for this basename: LIPASE, AMYLASE,  in the last 168 hours No results found for this basename: AMMONIA,  in the last 168 hours CBC:  Recent Labs Lab 06/26/13 1130 06/27/13 0555 06/23/2013  1230 06/29/13 0519 07/01/13 0500 07/02/13 0455  WBC 20.1* 16.3* 12.3* 9.7 8.9 10.0  NEUTROABS 16.5* 12.8*  --  6.9 6.2  --   HGB 9.9* 8.5* 8.1* 8.0* 7.8* 7.9*  HCT 29.0* 25.5* 24.4* 24.9* 24.3* 25.0*  MCV 85.5 87.9 89.7 92.9 93.1 93.3  PLT 321 289 266 281 305 337   Cardiac Enzymes: No results found for this basename: CKTOTAL, CKMB, CKMBINDEX, TROPONINI,  in the last 168 hours BNP (last 3 results)  Recent Labs  12/05/12 1558 06/06/13 2244 06/22/13 0900  PROBNP 277.0* 1764.0* 5835.0*   CBG:  Recent Labs Lab 07/01/13 1657 07/01/13 1939 07/02/13 0056 07/02/13 0450 07/02/13 0756  GLUCAP 231* 143* 130* 119* 143*    Recent Results (from the past 240 hour(s))   CULTURE, BLOOD (ROUTINE X 2)     Status: None   Collection Time    06/25/13 10:05 PM      Result Value Ref Range Status   Specimen Description BLOOD RIGHT HAND   Final   Special Requests BOTTLES DRAWN AEROBIC ONLY 3CC   Final   Culture  Setup Time     Final   Value: 06/26/2013 01:03     Performed at [facility]   Culture     Final   Value: NO GROWTH 5 DAYS     Performed at [facility]   Report Status 07/02/2013 FINAL   Final  CULTURE, BLOOD (ROUTINE X 2)     Status: None   Collection Time    06/25/13 10:10 PM      Result Value Ref Range Status   Specimen Description BLOOD LEFT WRIST   Final   Special Requests BOTTLES DRAWN AEROBIC ONLY 3CC   Final   Culture  Setup Time     Final   Value: 06/26/2013 01:03     Performed at [facility]   Culture     Final   Value: NO GROWTH 5 DAYS     Performed at [facility]   Report Status 07/02/2013 FINAL   Final  CLOSTRIDIUM DIFFICILE BY PCR     Status: None   Collection Time    06/27/13  3:11 AM      Result Value Ref Range Status   C difficile by pcr NEGATIVE  NEGATIVE Final  URINE CULTURE     Status: None   Collection Time    06/30/13 10:58 AM      Result Value Ref Range Status   Specimen Description URINE, CATHETERIZED   Final   Special Requests NONE   Final   Culture  Setup Time     Final   Value: 06/30/2013 19:44     Performed at [facility] Count     Final   Value: 40,000 COLONIES/ML     Performed at [facility]   Culture     Final   Value: YEAST     Performed at [facility]   Report Status 07/01/2013 FINAL   Final     Studies: Dg Chest Port 1 View  07/02/2013   CLINICAL DATA:  Cough and congestion.  EXAM: PORTABLE CHEST - 1 VIEW  COMPARISON:  06/27/2013  FINDINGS: PICC line tip in the SVC region. Again noted is volume loss in the left hemithorax. There are enlarged interstitial densities bilaterally. Findings suggest interstitial edema. Persistent  densities at the left lung base appear to be chronic and related to volume loss and  possibly cardiophrenic fat. However, there is haziness at the right lung base and small pleural effusions cannot be excluded.  IMPRESSION: Prominent interstitial lung markings suggestive interstitial pulmonary edema.  Densities at the lung bases and cannot exclude small pleural effusions, particularly on the right side.   Electronically Signed   By: Richarda OverlieAdam  Henn M.D.   On: 07/02/2013 08:21    Scheduled Meds: . albuterol  2.5 mg Nebulization TID  . bisoprolol  5 mg Oral Daily  . budesonide  0.5 mg Nebulization BID  . ertapenem  1 g Intravenous Q24H  . famotidine  20 mg Oral Daily  . guaiFENesin  400 mg Oral BID  . insulin aspart  0-20 Units Subcutaneous 6 times per day  . levothyroxine  50 mcg Oral QAC breakfast  . multivitamin with minerals  1 tablet Oral Daily  . predniSONE  10 mg Oral Q breakfast  . sodium chloride  10-40 mL Intracatheter Q12H  . tiotropium  18 mcg Inhalation Daily  . torsemide  80 mg Oral Daily  . [START ON 06/15/2013] vancomycin  500 mg Intravenous Q24H   Continuous Infusions: . sodium chloride 20 mL/hr (06/30/13 1504)  . Marland Kitchen.TPN (CLINIMIX-E) Adult 75 mL/hr at 07/01/13 1702   And  . fat emulsion 240 mL (07/01/13 1702)  . heparin 1,350 Units/hr (07/02/13 0640)    Active Problems:   Colonic diverticular abscess    Time spent: 35 minutes.     Hartley Barefootegalado, Gurpreet Mariani A  [facility] Pager [phone]. If 7PM-7AM, please contact night-coverage at [email or web address], password [email or web address] 07/02/2013, 8:28 AM  LOS: 4 days

## 2013-07-02 NOTE — Progress Notes (Signed)
ANTICOAGULATION CONSULT NOTE - Follow Up Consult  Pharmacy Consult for Heparin  Indication: atrial fibrillation  Allergies  Allergen Reactions  . Sulfonamide Derivatives Other (See Comments)    Childhood reaction    Patient Measurements: Height: 5\' 3"  (160 cm) Weight: 180 lb 8 oz (81.874 kg) IBW/kg (Calculated) : 52.4  Vital Signs: Temp: 98.2 F (36.8 C) (03/24 1438) Temp src: Oral (03/24 1438) BP: 136/69 mmHg (03/24 1438) Pulse Rate: 88 (03/24 1438)  Labs:  Recent Labs  06/30/13 0500 07/01/13 0500 07/01/13 1700 07/02/13 0455 07/02/13 0500 07/02/13 1500  HGB  --  7.8*  --  7.9*  --   --   HCT  --  24.3*  --  25.0*  --   --   PLT  --  305  --  337  --   --   LABPROT 29.7* 13.5  --   --   --   --   INR 2.95* 1.05  --   --   --   --   HEPARINUNFRC  --   --  0.21*  --  0.13* 0.33  CREATININE 1.21* 1.12*  --  1.13*  --   --     Estimated Creatinine Clearance: 39.6 ml/min (by C-G formula based on Cr of 1.13).  Medications:  . sodium chloride 20 mL/hr at 07/02/13 1530  . Marland Kitchen.TPN (CLINIMIX-E) Adult 75 mL/hr at 07/01/13 1702   And  . fat emulsion 240 mL (07/01/13 1702)  . heparin 1,350 Units/hr (07/02/13 0640)  . Marland Kitchen.TPN (CLINIMIX-E) Adult       Assessment: 80 y/o F on chronic anticoagulation for atrial fibrillation.  Coumadin is on hold and she is being bridged with Heparin for surgery planned 3/25.  Her heparin level is now therapeutic after rate adjustment.  Note her heparin stops 3/25 at 0600 for surgery.  Goal of Therapy:  Heparin level 0.3-0.7 units/ml Monitor platelets by anticoagulation protocol: Yes   Plan:  Continue Heparin at 1350 units/hr Heparin stops 3/25 at 0600, will f/u 3/25 PM for post-operative anticoagulation plans  Estella HuskMichelle Brytani Voth, Pharm.D., BCPS, AAHIVP Clinical Pharmacist Phone: [phone] or [phone] 07/02/2013, 4:13 PM

## 2013-07-02 NOTE — Progress Notes (Signed)
Surgery will be tomorrow afternoon. Obtain consent Hold heparin gtt 0600 3/25

## 2013-07-02 NOTE — Progress Notes (Signed)
Patient ID: [name], female   DOB: 1930/10/10, 80 y.o.   MRN: [id number]  Subjective: No changes.  Erythema surrounding the drain is much improved.  Objective:  Vital signs:  Filed Vitals:   07/01/13 2056 07/01/13 2104 07/02/13 0451 07/02/13 0802  BP:  132/74 141/59   Pulse:  95 98   Temp:  98.6 F (37 C) 98.3 F (36.8 C)   TempSrc:  Oral Oral   Resp:  18 18   Height:      Weight:   180 lb 8 oz (81.874 kg)   SpO2: 99% 97% 99% 98%       Intake/Output   Yesterday:  03/23 0701 - 03/24 0700 In: 1614.7 [I.V.:484.7; IV Piggyback:250; TPN:880] Out: 2025 [Urine:2000; Drains:25] This shift:    I/O last 3 completed shifts: In: 2094.7 [I.V.:964.7; IV Piggyback:250] Out: 3545 [Urine:3950; Drains:30]    Physical Exam:  General: Pt awake/alert/oriented x3 in no acute distress  Chest: coarse  CV: Irregularly irregular  Abdomen: Soft. Nondistended. Mild ttp umbilical region and llq. No evidence of peritonitis. Small umbilical hernia. Drain with stool. Foley with fecal matter.  Ext: SCDs BLE. No mjr edema. No cyanosis   Problem List:   Active Problems:   Colonic diverticular abscess    Results:   Labs: Results for orders placed during the hospital encounter of 07/02/2013 (from the past 48 hour(s))  GLUCOSE, CAPILLARY     Status: Abnormal   Collection Time    06/30/13 12:50 PM      Result Value Ref Range   Glucose-Capillary 273 (*) 70 - 99 mg/dL   Comment 1 Notify RN    GLUCOSE, CAPILLARY     Status: Abnormal   Collection Time    06/30/13  5:27 PM      Result Value Ref Range   Glucose-Capillary 289 (*) 70 - 99 mg/dL   Comment 1 Notify RN    GLUCOSE, CAPILLARY     Status: Abnormal   Collection Time    06/30/13 10:10 PM      Result Value Ref Range   Glucose-Capillary 197 (*) 70 - 99 mg/dL  COMPREHENSIVE METABOLIC PANEL     Status: Abnormal   Collection Time    07/01/13  5:00 AM      Result Value Ref Range   Sodium 138  137 - 147 mEq/L   Potassium 4.4   3.7 - 5.3 mEq/L   Chloride 93 (*) 96 - 112 mEq/L   CO2 36 (*) 19 - 32 mEq/L   Glucose, Bld 184 (*) 70 - 99 mg/dL   BUN 41 (*) 6 - 23 mg/dL   Creatinine, Ser 1.12 (*) 0.50 - 1.10 mg/dL   Calcium 9.1  8.4 - 10.5 mg/dL   Total Protein 6.6  6.0 - 8.3 g/dL   Albumin 2.1 (*) 3.5 - 5.2 g/dL   AST 9  0 - 37 U/L   ALT 9  0 - 35 U/L   Alkaline Phosphatase 76  39 - 117 U/L   Total Bilirubin 0.3  0.3 - 1.2 mg/dL   GFR calc non Af Amer 45 (*) >90 mL/min   GFR calc Af Amer 52 (*) >90 mL/min   Comment: (NOTE)     The eGFR has been calculated using the CKD EPI equation.     This calculation has not been validated in all clinical situations.     eGFR's persistently <90 mL/min signify possible Chronic Kidney     Disease.  MAGNESIUM     Status: None   Collection Time    07/01/13  5:00 AM      Result Value Ref Range   Magnesium 2.0  1.5 - 2.5 mg/dL  PHOSPHORUS     Status: None   Collection Time    07/01/13  5:00 AM      Result Value Ref Range   Phosphorus 3.8  2.3 - 4.6 mg/dL  CBC     Status: Abnormal   Collection Time    07/01/13  5:00 AM      Result Value Ref Range   WBC 8.9  4.0 - 10.5 K/uL   RBC 2.61 (*) 3.87 - 5.11 MIL/uL   Hemoglobin 7.8 (*) 12.0 - 15.0 g/dL   HCT 24.3 (*) 36.0 - 46.0 %   MCV 93.1  78.0 - 100.0 fL   MCH 29.9  26.0 - 34.0 pg   MCHC 32.1  30.0 - 36.0 g/dL   RDW 24.0 (*) 11.5 - 15.5 %   Platelets 305  150 - 400 K/uL  DIFFERENTIAL     Status: None   Collection Time    07/01/13  5:00 AM      Result Value Ref Range   Neutrophils Relative % 70  43 - 77 %   Lymphocytes Relative 17  12 - 46 %   Monocytes Relative 11  3 - 12 %   Eosinophils Relative 1  0 - 5 %   Basophils Relative 1  0 - 1 %   Neutro Abs 6.2  1.7 - 7.7 K/uL   Lymphs Abs 1.5  0.7 - 4.0 K/uL   Monocytes Absolute 1.0  0.1 - 1.0 K/uL   Eosinophils Absolute 0.1  0.0 - 0.7 K/uL   Basophils Absolute 0.1  0.0 - 0.1 K/uL   RBC Morphology POLYCHROMASIA PRESENT     Comment: SPHEROCYTES     TARGET CELLS   PROTIME-INR     Status: None   Collection Time    07/01/13  5:00 AM      Result Value Ref Range   Prothrombin Time 13.5  11.6 - 15.2 seconds   INR 1.05  0.00 - 1.49  TRIGLYCERIDES     Status: Abnormal   Collection Time    07/01/13  5:00 AM      Result Value Ref Range   Triglycerides 336 (*) <150 mg/dL  GLUCOSE, CAPILLARY     Status: Abnormal   Collection Time    07/01/13  7:54 AM      Result Value Ref Range   Glucose-Capillary 190 (*) 70 - 99 mg/dL  GLUCOSE, CAPILLARY     Status: Abnormal   Collection Time    07/01/13 11:26 AM      Result Value Ref Range   Glucose-Capillary 292 (*) 70 - 99 mg/dL  GLUCOSE, CAPILLARY     Status: Abnormal   Collection Time    07/01/13  2:27 PM      Result Value Ref Range   Glucose-Capillary 305 (*) 70 - 99 mg/dL  GLUCOSE, CAPILLARY     Status: Abnormal   Collection Time    07/01/13  4:57 PM      Result Value Ref Range   Glucose-Capillary 231 (*) 70 - 99 mg/dL  HEPARIN LEVEL (UNFRACTIONATED)     Status: Abnormal   Collection Time    07/01/13  5:00 PM      Result Value Ref Range   Heparin Unfractionated 0.21 (*) 0.30 -  0.70 IU/mL   Comment:            IF HEPARIN RESULTS ARE BELOW     EXPECTED VALUES, AND PATIENT     DOSAGE HAS BEEN CONFIRMED,     SUGGEST FOLLOW UP TESTING     OF ANTITHROMBIN III LEVELS.  GLUCOSE, CAPILLARY     Status: Abnormal   Collection Time    07/01/13  7:39 PM      Result Value Ref Range   Glucose-Capillary 143 (*) 70 - 99 mg/dL  VANCOMYCIN, TROUGH     Status: None   Collection Time    07/01/13 11:55 PM      Result Value Ref Range   Vancomycin Tr 18.6  10.0 - 20.0 ug/mL  GLUCOSE, CAPILLARY     Status: Abnormal   Collection Time    07/02/13 12:56 AM      Result Value Ref Range   Glucose-Capillary 130 (*) 70 - 99 mg/dL  GLUCOSE, CAPILLARY     Status: Abnormal   Collection Time    07/02/13  4:50 AM      Result Value Ref Range   Glucose-Capillary 119 (*) 70 - 99 mg/dL  BASIC METABOLIC PANEL     Status:  Abnormal   Collection Time    07/02/13  4:55 AM      Result Value Ref Range   Sodium 134 (*) 137 - 147 mEq/L   Potassium 4.3  3.7 - 5.3 mEq/L   Chloride 87 (*) 96 - 112 mEq/L   CO2 35 (*) 19 - 32 mEq/L   Glucose, Bld 120 (*) 70 - 99 mg/dL   BUN 46 (*) 6 - 23 mg/dL   Creatinine, Ser 1.13 (*) 0.50 - 1.10 mg/dL   Calcium 9.2  8.4 - 10.5 mg/dL   GFR calc non Af Amer 44 (*) >90 mL/min   GFR calc Af Amer 51 (*) >90 mL/min   Comment: (NOTE)     The eGFR has been calculated using the CKD EPI equation.     This calculation has not been validated in all clinical situations.     eGFR's persistently <90 mL/min signify possible Chronic Kidney     Disease.  MAGNESIUM     Status: None   Collection Time    07/02/13  4:55 AM      Result Value Ref Range   Magnesium 1.9  1.5 - 2.5 mg/dL  PHOSPHORUS     Status: None   Collection Time    07/02/13  4:55 AM      Result Value Ref Range   Phosphorus 4.0  2.3 - 4.6 mg/dL  CBC     Status: Abnormal   Collection Time    07/02/13  4:55 AM      Result Value Ref Range   WBC 10.0  4.0 - 10.5 K/uL   RBC 2.68 (*) 3.87 - 5.11 MIL/uL   Hemoglobin 7.9 (*) 12.0 - 15.0 g/dL   HCT 25.0 (*) 36.0 - 46.0 %   MCV 93.3  78.0 - 100.0 fL   MCH 29.5  26.0 - 34.0 pg   MCHC 31.6  30.0 - 36.0 g/dL   RDW 24.1 (*) 11.5 - 15.5 %   Platelets 337  150 - 400 K/uL  TRIGLYCERIDES     Status: Abnormal   Collection Time    07/02/13  4:55 AM      Result Value Ref Range   Triglycerides 313 (*) <150 mg/dL  HEPARIN LEVEL (  UNFRACTIONATED)     Status: Abnormal   Collection Time    07/02/13  5:00 AM      Result Value Ref Range   Heparin Unfractionated 0.13 (*) 0.30 - 0.70 IU/mL   Comment:            IF HEPARIN RESULTS ARE BELOW     EXPECTED VALUES, AND PATIENT     DOSAGE HAS BEEN CONFIRMED,     SUGGEST FOLLOW UP TESTING     OF ANTITHROMBIN III LEVELS.  GLUCOSE, CAPILLARY     Status: Abnormal   Collection Time    07/02/13  7:56 AM      Result Value Ref Range    Glucose-Capillary 143 (*) 70 - 99 mg/dL   Comment 1 Notify RN      Imaging / Studies: Dg Chest Port 1 View  07/02/2013   CLINICAL DATA:  Cough and congestion.  EXAM: PORTABLE CHEST - 1 VIEW  COMPARISON:  06/27/2013  FINDINGS: PICC line tip in the SVC region. Again noted is volume loss in the left hemithorax. There are enlarged interstitial densities bilaterally. Findings suggest interstitial edema. Persistent densities at the left lung base appear to be chronic and related to volume loss and possibly cardiophrenic fat. However, there is haziness at the right lung base and small pleural effusions cannot be excluded.  IMPRESSION: Prominent interstitial lung markings suggestive interstitial pulmonary edema.  Densities at the lung bases and cannot exclude small pleural effusions, particularly on the right side.   Electronically Signed   By: Markus Daft M.D.   On: 07/02/2013 08:21    Medications / Allergies: per chart  Antibiotics: Anti-infectives   Start     Dose/Rate Route Frequency Ordered Stop   06/14/2013 0600  vancomycin (VANCOCIN) 500 mg in sodium chloride 0.9 % 100 mL IVPB     500 mg 100 mL/hr over 60 Minutes Intravenous Every 24 hours 07/02/13 0230     06/09/2013 2300  ertapenem (INVANZ) 1 g in sodium chloride 0.9 % 50 mL IVPB     1 g 100 mL/hr over 30 Minutes Intravenous Every 24 hours 06/27/2013 2142     06/15/2013 2200  vancomycin (VANCOCIN) IVPB 750 mg/150 ml premix  Status:  Discontinued     750 mg 150 mL/hr over 60 Minutes Intravenous Every 24 hours 06/27/2013 2104 07/02/13 0230      Assessment/Plan  Diverticulitis with abscess and colocutaneous and colovesical fistula.  Plans for laparotomy, hartmans resection with colostomy. Consult to urology with assist of colovesical fistula. Hopefully we can get a date and time set today. TNA  NPO  atbx changed to Invanz 3/20  Vanc for cellulitis surrounding the drain, much improved  We requested cardiology clearance. I spoke with Dr. Jeffie Pollock  who stated that the recommendations would not change from her previous evaluation in February and re-consult post operatively if needed.  Urine culture shows yeast, add fluconazole Hypokalemia-supplement PRN  Deconditioning-PT/OT DM-TPN, CBGs and SSI  Atrial fibrillation-continue to hold coumadin, heparin gtt  COPD, home 02-appreciate CCM assistance  Chronic diastolic dysfunction-strict I&Os, daily weights  CKD -baseline  OSA -CPAP  Hypothyroidism-home meds  Appreciate IM assistance with chronic medical problems, giving lasix today  Erby Pian, [facility] Pager [phone] Office [phone]  07/02/2013 11:31 AM

## 2013-07-03 ENCOUNTER — Inpatient Hospital Stay (HOSPITAL_COMMUNITY): Payer: Medicare Other | Admitting: Anesthesiology

## 2013-07-03 ENCOUNTER — Encounter (HOSPITAL_COMMUNITY): Payer: Self-pay | Admitting: Certified Registered Nurse Anesthetist

## 2013-07-03 ENCOUNTER — Encounter (HOSPITAL_COMMUNITY): Admission: EM | Disposition: E | Payer: Self-pay | Source: Ambulatory Visit

## 2013-07-03 ENCOUNTER — Encounter (HOSPITAL_COMMUNITY): Payer: Medicare Other | Admitting: Anesthesiology

## 2013-07-03 ENCOUNTER — Inpatient Hospital Stay (HOSPITAL_COMMUNITY): Payer: Medicare Other

## 2013-07-03 DIAGNOSIS — J9600 Acute respiratory failure, unspecified whether with hypoxia or hypercapnia: Secondary | ICD-10-CM

## 2013-07-03 DIAGNOSIS — Z9049 Acquired absence of other specified parts of digestive tract: Secondary | ICD-10-CM

## 2013-07-03 DIAGNOSIS — I4891 Unspecified atrial fibrillation: Secondary | ICD-10-CM

## 2013-07-03 DIAGNOSIS — I5032 Chronic diastolic (congestive) heart failure: Secondary | ICD-10-CM

## 2013-07-03 DIAGNOSIS — N184 Chronic kidney disease, stage 4 (severe): Secondary | ICD-10-CM

## 2013-07-03 DIAGNOSIS — K5732 Diverticulitis of large intestine without perforation or abscess without bleeding: Secondary | ICD-10-CM

## 2013-07-03 HISTORY — PX: CYSTOSCOPY W/ URETERAL STENT PLACEMENT: SHX1429

## 2013-07-03 HISTORY — PX: COLOSTOMY REVISION: SHX5232

## 2013-07-03 LAB — COMPREHENSIVE METABOLIC PANEL
ALT: 9 U/L (ref 0–35)
AST: 17 U/L (ref 0–37)
Albumin: 1.8 g/dL — ABNORMAL LOW (ref 3.5–5.2)
Alkaline Phosphatase: 89 U/L (ref 39–117)
BILIRUBIN TOTAL: 0.3 mg/dL (ref 0.3–1.2)
BUN: 57 mg/dL — AB (ref 6–23)
CO2: 35 mEq/L — ABNORMAL HIGH (ref 19–32)
CREATININE: 1.15 mg/dL — AB (ref 0.50–1.10)
Calcium: 9.3 mg/dL (ref 8.4–10.5)
Chloride: 87 mEq/L — ABNORMAL LOW (ref 96–112)
GFR calc non Af Amer: 43 mL/min — ABNORMAL LOW (ref >90)
GFR, EST AFRICAN AMERICAN: 50 mL/min — AB (ref >90)
GLUCOSE: 132 mg/dL — AB (ref 70–99)
Potassium: 4.4 mEq/L (ref 3.7–5.3)
Sodium: 135 mEq/L — ABNORMAL LOW (ref 137–147)
Total Protein: 6.6 g/dL (ref 6.0–8.3)

## 2013-07-03 LAB — CBC
HEMATOCRIT: 24.2 % — AB (ref 36.0–46.0)
Hemoglobin: 7.7 g/dL — ABNORMAL LOW (ref 12.0–15.0)
MCH: 30.1 pg (ref 26.0–34.0)
MCHC: 31.8 g/dL (ref 30.0–36.0)
MCV: 94.5 fL (ref 78.0–100.0)
Platelets: 349 10*3/uL (ref 150–400)
RBC: 2.56 MIL/uL — ABNORMAL LOW (ref 3.87–5.11)
RDW: 23.8 % — AB (ref 11.5–15.5)
WBC: 9.9 10*3/uL (ref 4.0–10.5)

## 2013-07-03 LAB — GLUCOSE, CAPILLARY
GLUCOSE-CAPILLARY: 232 mg/dL — AB (ref 70–99)
Glucose-Capillary: 122 mg/dL — ABNORMAL HIGH (ref 70–99)
Glucose-Capillary: 124 mg/dL — ABNORMAL HIGH (ref 70–99)
Glucose-Capillary: 131 mg/dL — ABNORMAL HIGH (ref 70–99)
Glucose-Capillary: 305 mg/dL — ABNORMAL HIGH (ref 70–99)

## 2013-07-03 LAB — POCT I-STAT 7, (LYTES, BLD GAS, ICA,H+H)
ACID-BASE EXCESS: 9 mmol/L — AB (ref 0.0–2.0)
Bicarbonate: 33.6 mEq/L — ABNORMAL HIGH (ref 20.0–24.0)
Calcium, Ion: 1.07 mmol/L — ABNORMAL LOW (ref 1.13–1.30)
HCT: 24 % — ABNORMAL LOW (ref 36.0–46.0)
HEMOGLOBIN: 8.2 g/dL — AB (ref 12.0–15.0)
O2 Saturation: 100 %
PH ART: 7.5 — AB (ref 7.350–7.450)
POTASSIUM: 4.7 meq/L (ref 3.7–5.3)
Patient temperature: 36.6
Sodium: 131 mEq/L — ABNORMAL LOW (ref 137–147)
TCO2: 35 mmol/L (ref 0–100)
pCO2 arterial: 42.9 mmHg (ref 35.0–45.0)
pO2, Arterial: 156 mmHg — ABNORMAL HIGH (ref 80.0–100.0)

## 2013-07-03 LAB — CBC WITH DIFFERENTIAL/PLATELET
Basophils Absolute: 0 10*3/uL (ref 0.0–0.1)
Basophils Relative: 0 % (ref 0–1)
Eosinophils Absolute: 0 10*3/uL (ref 0.0–0.7)
Eosinophils Relative: 0 % (ref 0–5)
HCT: 35.1 % — ABNORMAL LOW (ref 36.0–46.0)
Hemoglobin: 11.8 g/dL — ABNORMAL LOW (ref 12.0–15.0)
LYMPHS ABS: 0.8 10*3/uL (ref 0.7–4.0)
Lymphocytes Relative: 5 % — ABNORMAL LOW (ref 12–46)
MCH: 29.5 pg (ref 26.0–34.0)
MCHC: 33.6 g/dL (ref 30.0–36.0)
MCV: 87.8 fL (ref 78.0–100.0)
Monocytes Absolute: 0.4 10*3/uL (ref 0.1–1.0)
Monocytes Relative: 2 % — ABNORMAL LOW (ref 3–12)
NEUTROS ABS: 16.8 10*3/uL — AB (ref 1.7–7.7)
NEUTROS PCT: 93 % — AB (ref 43–77)
Platelets: 320 10*3/uL (ref 150–400)
RBC: 4 MIL/uL (ref 3.87–5.11)
RDW: 20.5 % — ABNORMAL HIGH (ref 11.5–15.5)
WBC: 18 10*3/uL — ABNORMAL HIGH (ref 4.0–10.5)

## 2013-07-03 LAB — HEPARIN LEVEL (UNFRACTIONATED): Heparin Unfractionated: <0.1 IU/mL — ABNORMAL LOW (ref 0.30–0.70)

## 2013-07-03 LAB — POCT I-STAT 3, ART BLOOD GAS (G3+)
Acid-Base Excess: 7 mmol/L — ABNORMAL HIGH (ref 0.0–2.0)
Bicarbonate: 33.2 mEq/L — ABNORMAL HIGH (ref 20.0–24.0)
O2 SAT: 99 %
PCO2 ART: 51.5 mmHg — AB (ref 35.0–45.0)
PO2 ART: 137 mmHg — AB (ref 80.0–100.0)
Patient temperature: 98.2
TCO2: 35 mmol/L (ref 0–100)
pH, Arterial: 7.416 (ref 7.350–7.450)

## 2013-07-03 LAB — PREALBUMIN: PREALBUMIN: 17.4 mg/dL — AB (ref 17.0–34.0)

## 2013-07-03 LAB — PREPARE RBC (CROSSMATCH)

## 2013-07-03 LAB — BASIC METABOLIC PANEL
BUN: 61 mg/dL — ABNORMAL HIGH (ref 6–23)
CO2: 30 mEq/L (ref 19–32)
Calcium: 8.5 mg/dL (ref 8.4–10.5)
Chloride: 90 mEq/L — ABNORMAL LOW (ref 96–112)
Creatinine, Ser: 1.21 mg/dL — ABNORMAL HIGH (ref 0.50–1.10)
GFR calc non Af Amer: 41 mL/min — ABNORMAL LOW (ref >90)
GFR, EST AFRICAN AMERICAN: 47 mL/min — AB (ref >90)
GLUCOSE: 274 mg/dL — AB (ref 70–99)
POTASSIUM: 5.1 meq/L (ref 3.7–5.3)
SODIUM: 130 meq/L — AB (ref 137–147)

## 2013-07-03 LAB — POCT I-STAT 4, (NA,K, GLUC, HGB,HCT)
Glucose, Bld: 291 mg/dL — ABNORMAL HIGH (ref 70–99)
HEMATOCRIT: 31 % — AB (ref 36.0–46.0)
Hemoglobin: 10.5 g/dL — ABNORMAL LOW (ref 12.0–15.0)
POTASSIUM: 5.6 meq/L — AB (ref 3.7–5.3)
Sodium: 130 mEq/L — ABNORMAL LOW (ref 137–147)

## 2013-07-03 SURGERY — COLECTOMY, SIGMOID, OPEN
Anesthesia: General | Site: Ureter

## 2013-07-03 MED ORDER — FENTANYL BOLUS VIA INFUSION
25.0000 ug | INTRAVENOUS | Status: DC | PRN
  Filled 2013-07-03: qty 50

## 2013-07-03 MED ORDER — LIDOCAINE HCL (CARDIAC) 20 MG/ML IV SOLN
INTRAVENOUS | Status: DC | PRN
  Administered 2013-07-03: 80 mg via INTRAVENOUS

## 2013-07-03 MED ORDER — LIDOCAINE HCL (CARDIAC) 20 MG/ML IV SOLN
INTRAVENOUS | Status: AC
  Filled 2013-07-03: qty 5

## 2013-07-03 MED ORDER — 0.9 % SODIUM CHLORIDE (POUR BTL) OPTIME
TOPICAL | Status: DC | PRN
  Administered 2013-07-03 (×4): 1000 mL

## 2013-07-03 MED ORDER — FENTANYL CITRATE 0.05 MG/ML IJ SOLN
INTRAMUSCULAR | Status: AC
  Filled 2013-07-03: qty 5

## 2013-07-03 MED ORDER — INSULIN REGULAR HUMAN 100 UNIT/ML IJ SOLN
INTRAMUSCULAR | Status: AC
  Administered 2013-07-03: 19:00:00 via INTRAVENOUS
  Filled 2013-07-03: qty 2000

## 2013-07-03 MED ORDER — HYDROCORTISONE SOD SUCCINATE 100 MG IJ SOLR
50.0000 mg | Freq: Four times a day (QID) | INTRAMUSCULAR | Status: DC
  Administered 2013-07-03 – 2013-07-05 (×6): 50 mg via INTRAVENOUS
  Filled 2013-07-03 (×11): qty 1

## 2013-07-03 MED ORDER — LACTATED RINGERS IV SOLN
INTRAVENOUS | Status: DC | PRN
  Administered 2013-07-03: 13:00:00 via INTRAVENOUS

## 2013-07-03 MED ORDER — SODIUM CHLORIDE 0.9 % IJ SOLN
INTRAMUSCULAR | Status: AC
  Filled 2013-07-03: qty 10

## 2013-07-03 MED ORDER — STERILE WATER FOR IRRIGATION IR SOLN
Status: DC | PRN
  Administered 2013-07-03: 1

## 2013-07-03 MED ORDER — METOPROLOL TARTRATE 1 MG/ML IV SOLN
5.0000 mg | Freq: Four times a day (QID) | INTRAVENOUS | Status: DC
  Administered 2013-07-03 – 2013-07-04 (×3): 5 mg via INTRAVENOUS
  Filled 2013-07-03 (×7): qty 5

## 2013-07-03 MED ORDER — PROPOFOL 10 MG/ML IV EMUL
INTRAVENOUS | Status: AC
  Filled 2013-07-03: qty 50

## 2013-07-03 MED ORDER — ALBUTEROL SULFATE (2.5 MG/3ML) 0.083% IN NEBU: 2.5000 mg | INHALATION_SOLUTION | RESPIRATORY_TRACT | Status: DC | PRN

## 2013-07-03 MED ORDER — FENTANYL CITRATE 0.05 MG/ML IJ SOLN
INTRAMUSCULAR | Status: DC | PRN
  Administered 2013-07-03: 50 ug via INTRAVENOUS
  Administered 2013-07-03: 150 ug via INTRAVENOUS
  Administered 2013-07-03: 100 ug via INTRAVENOUS
  Administered 2013-07-03 (×4): 50 ug via INTRAVENOUS

## 2013-07-03 MED ORDER — INSULIN ASPART 100 UNIT/ML ~~LOC~~ SOLN
0.0000 [IU] | SUBCUTANEOUS | Status: DC
  Administered 2013-07-03: 11 [IU] via SUBCUTANEOUS
  Administered 2013-07-03: 8 [IU] via SUBCUTANEOUS
  Administered 2013-07-03 – 2013-07-04 (×2): 5 [IU] via SUBCUTANEOUS
  Administered 2013-07-04: 8 [IU] via SUBCUTANEOUS

## 2013-07-03 MED ORDER — SODIUM CHLORIDE 0.9 % IV SOLN
INTRAVENOUS | Status: DC | PRN
  Administered 2013-07-03: 14:00:00 via INTRAVENOUS

## 2013-07-03 MED ORDER — METHYLENE BLUE 1 % INJ SOLN
INTRAMUSCULAR | Status: DC | PRN
  Administered 2013-07-03: 10 mL

## 2013-07-03 MED ORDER — GLYCOPYRROLATE 0.2 MG/ML IJ SOLN
INTRAMUSCULAR | Status: AC
  Filled 2013-07-03: qty 1

## 2013-07-03 MED ORDER — LEVOTHYROXINE SODIUM 100 MCG IV SOLR
25.0000 ug | Freq: Every day | INTRAVENOUS | Status: DC
  Administered 2013-07-03 – 2013-07-06 (×4): 25 ug via INTRAVENOUS
  Filled 2013-07-03 (×4): qty 5

## 2013-07-03 MED ORDER — FENTANYL CITRATE 0.05 MG/ML IJ SOLN
0.0000 ug/h | INTRAMUSCULAR | Status: DC
  Administered 2013-07-03: 50 ug/h via INTRAVENOUS
  Filled 2013-07-03: qty 50

## 2013-07-03 MED ORDER — ONDANSETRON HCL 4 MG/2ML IJ SOLN
INTRAMUSCULAR | Status: AC
  Filled 2013-07-03: qty 2

## 2013-07-03 MED ORDER — VECURONIUM BROMIDE 10 MG IV SOLR
INTRAVENOUS | Status: DC | PRN
  Administered 2013-07-03: 3 mg via INTRAVENOUS
  Administered 2013-07-03: 5 mg via INTRAVENOUS
  Administered 2013-07-03: 2 mg via INTRAVENOUS

## 2013-07-03 MED ORDER — SODIUM CHLORIDE 0.9 % IV SOLN
10.0000 mg | INTRAVENOUS | Status: DC | PRN
  Administered 2013-07-03: 50 ug/min via INTRAVENOUS

## 2013-07-03 MED ORDER — IPRATROPIUM-ALBUTEROL 0.5-2.5 (3) MG/3ML IN SOLN
3.0000 mL | RESPIRATORY_TRACT | Status: DC
  Administered 2013-07-03 – 2013-07-06 (×15): 3 mL via RESPIRATORY_TRACT
  Filled 2013-07-03 (×15): qty 3

## 2013-07-03 MED ORDER — LACTATED RINGERS IV SOLN
INTRAVENOUS | Status: DC | PRN
  Administered 2013-07-03: 14:00:00 via INTRAVENOUS

## 2013-07-03 MED ORDER — MIDAZOLAM HCL 2 MG/2ML IJ SOLN
INTRAMUSCULAR | Status: AC
  Filled 2013-07-03: qty 2

## 2013-07-03 MED ORDER — IOHEXOL 300 MG/ML  SOLN
INTRAMUSCULAR | Status: DC | PRN
  Administered 2013-07-03: 50 mL via URETHRAL

## 2013-07-03 MED ORDER — ROCURONIUM BROMIDE 100 MG/10ML IV SOLN
INTRAVENOUS | Status: DC | PRN
  Administered 2013-07-03 (×2): 50 mg via INTRAVENOUS

## 2013-07-03 MED ORDER — IPRATROPIUM-ALBUTEROL 0.5-2.5 (3) MG/3ML IN SOLN: 3.0000 mL | RESPIRATORY_TRACT | Status: DC

## 2013-07-03 MED ORDER — FENTANYL CITRATE 0.05 MG/ML IJ SOLN
50.0000 ug | Freq: Once | INTRAMUSCULAR | Status: AC
  Administered 2013-07-03: 50 ug via INTRAVENOUS

## 2013-07-03 MED ORDER — VECURONIUM BROMIDE 10 MG IV SOLR
INTRAVENOUS | Status: AC
  Filled 2013-07-03: qty 10

## 2013-07-03 MED ORDER — FAT EMULSION 20 % IV EMUL
250.0000 mL | INTRAVENOUS | Status: AC
  Administered 2013-07-03: 250 mL via INTRAVENOUS
  Filled 2013-07-03: qty 250

## 2013-07-03 MED ORDER — PROPOFOL 10 MG/ML IV BOLUS
INTRAVENOUS | Status: DC | PRN
Start: 2013-07-03 — End: 2013-07-03
  Administered 2013-07-03: 70 mg via INTRAVENOUS

## 2013-07-03 MED ORDER — PROPOFOL 10 MG/ML IV BOLUS
INTRAVENOUS | Status: AC
  Filled 2013-07-03: qty 20

## 2013-07-03 MED ORDER — ROCURONIUM BROMIDE 50 MG/5ML IV SOLN
INTRAVENOUS | Status: AC
  Filled 2013-07-03: qty 1

## 2013-07-03 MED ORDER — METHYLENE BLUE 1 % INJ SOLN
INTRAMUSCULAR | Status: AC
  Filled 2013-07-03: qty 10

## 2013-07-03 SURGICAL SUPPLY — 102 items
ADAPTER CATH URET PLST 4-6FR (CATHETERS) ×1 IMPLANT
ADAPTER GOLDBERG URETERAL (ADAPTER) ×1 IMPLANT
ADPR CATH 15X14FR FL DRN BG (ADAPTER) ×2
ADPR CATH URET STRL DISP 4-6FR (CATHETERS) ×2
APL SKNCLS STERI-STRIP NONHPOA (GAUZE/BANDAGES/DRESSINGS)
BAG URINE DRAINAGE (UROLOGICAL SUPPLIES) ×3 IMPLANT
BAG URO CATCHER STRL LF (DRAPE) ×3 IMPLANT
BANDAGE GAUZE ELAST BULKY 4 IN (GAUZE/BANDAGES/DRESSINGS) ×1 IMPLANT
BENZOIN TINCTURE PRP APPL 2/3 (GAUZE/BANDAGES/DRESSINGS) IMPLANT
BLADE SURG ROTATE 9660 (MISCELLANEOUS) IMPLANT
BUCKET BIOHAZARD WASTE 5 GAL (MISCELLANEOUS) ×3 IMPLANT
CANISTER SUCTION 2500CC (MISCELLANEOUS) ×3 IMPLANT
CATH FOLEY 2WAY SLVR  5CC 16FR (CATHETERS)
CATH FOLEY 2WAY SLVR 5CC 16FR (CATHETERS) IMPLANT
CATH INTERMIT  6FR 70CM (CATHETERS) ×2 IMPLANT
CATH URET 5FR 28IN CONE TIP (BALLOONS)
CATH URET 5FR 70CM CONE TIP (BALLOONS) IMPLANT
CHLORAPREP W/TINT 26ML (MISCELLANEOUS) ×3 IMPLANT
COVER MAYO STAND STRL (DRAPES) ×7 IMPLANT
COVER SURGICAL LIGHT HANDLE (MISCELLANEOUS) ×3 IMPLANT
DRAIN CHANNEL 19F RND (DRAIN) ×1 IMPLANT
DRAIN PENROSE 1/2X12 LTX STRL (WOUND CARE) ×1 IMPLANT
DRAPE CAMERA CLOSED 9X96 (DRAPES) ×6 IMPLANT
DRAPE LAPAROSCOPIC ABDOMINAL (DRAPES) ×3 IMPLANT
DRAPE PROXIMA HALF (DRAPES) ×6 IMPLANT
DRAPE UTILITY 15X26 W/TAPE STR (DRAPE) ×15 IMPLANT
DRAPE WARM FLUID 44X44 (DRAPE) ×3 IMPLANT
DRESSING ALLEVYN LIFE SACRUM (GAUZE/BANDAGES/DRESSINGS) ×1 IMPLANT
DRSG OPSITE POSTOP 4X10 (GAUZE/BANDAGES/DRESSINGS) IMPLANT
DRSG OPSITE POSTOP 4X8 (GAUZE/BANDAGES/DRESSINGS) IMPLANT
DRSG PAD ABDOMINAL 8X10 ST (GAUZE/BANDAGES/DRESSINGS) ×1 IMPLANT
ELECT BLADE 6.5 EXT (BLADE) ×3 IMPLANT
ELECT CAUTERY BLADE 6.4 (BLADE) ×6 IMPLANT
ELECT REM PT RETURN 9FT ADLT (ELECTROSURGICAL) ×3
ELECTRODE REM PT RTRN 9FT ADLT (ELECTROSURGICAL) ×2 IMPLANT
EVACUATOR SILICONE 100CC (DRAIN) ×1 IMPLANT
GAUZE SPONGE 4X4 16PLY XRAY LF (GAUZE/BANDAGES/DRESSINGS) ×1 IMPLANT
GLOVE BIO SURGEON STRL SZ 6 (GLOVE) ×3 IMPLANT
GLOVE BIO SURGEON STRL SZ7.5 (GLOVE) ×2 IMPLANT
GLOVE BIO SURGEON STRL SZ8 (GLOVE) ×6 IMPLANT
GLOVE BIOGEL PI IND STRL 6.5 (GLOVE) IMPLANT
GLOVE BIOGEL PI IND STRL 7.0 (GLOVE) IMPLANT
GLOVE BIOGEL PI IND STRL 7.5 (GLOVE) IMPLANT
GLOVE BIOGEL PI IND STRL 8 (GLOVE) ×4 IMPLANT
GLOVE BIOGEL PI INDICATOR 6.5 (GLOVE) ×2
GLOVE BIOGEL PI INDICATOR 7.0 (GLOVE) ×2
GLOVE BIOGEL PI INDICATOR 7.5 (GLOVE) ×1
GLOVE BIOGEL PI INDICATOR 8 (GLOVE) ×2
GLOVE SURG SS PI 7.0 STRL IVOR (GLOVE) ×2 IMPLANT
GOWN SPEC L3 XXLG W/TWL (GOWN DISPOSABLE) ×1 IMPLANT
GOWN STRL REUS W/ TWL LRG LVL3 (GOWN DISPOSABLE) ×10 IMPLANT
GOWN STRL REUS W/ TWL XL LVL3 (GOWN DISPOSABLE) ×4 IMPLANT
GOWN STRL REUS W/TWL LRG LVL3 (GOWN DISPOSABLE) ×15
GOWN STRL REUS W/TWL XL LVL3 (GOWN DISPOSABLE) ×6
GUIDEWIRE COOK  .035 (WIRE) IMPLANT
GUIDEWIRE STR DUAL SENSOR (WIRE) ×2 IMPLANT
KIT BASIN OR (CUSTOM PROCEDURE TRAY) ×3 IMPLANT
KIT OSTOMY DRAINABLE 2.75 STR (WOUND CARE) ×1 IMPLANT
KIT ROOM TURNOVER OR (KITS) ×6 IMPLANT
LEGGING LITHOTOMY PAIR STRL (DRAPES) ×3 IMPLANT
LIGASURE IMPACT 36 18CM CVD LR (INSTRUMENTS) ×1 IMPLANT
NS IRRIG 1000ML POUR BTL (IV SOLUTION) ×12 IMPLANT
PACK CYSTOSCOPY (CUSTOM PROCEDURE TRAY) ×3 IMPLANT
PACK GENERAL/GYN (CUSTOM PROCEDURE TRAY) ×3 IMPLANT
PAD ABD 8X10 STRL (GAUZE/BANDAGES/DRESSINGS) ×1 IMPLANT
PAD ARMBOARD 7.5X6 YLW CONV (MISCELLANEOUS) ×9 IMPLANT
PENCIL BUTTON HOLSTER BLD 10FT (ELECTRODE) ×3 IMPLANT
PLUG CATH AND CAP STER (CATHETERS) IMPLANT
RELOAD PROXIMATE 75MM BLUE (ENDOMECHANICALS) ×3 IMPLANT
RELOAD STAPLE 75 3.8 BLU REG (ENDOMECHANICALS) IMPLANT
SPECIMEN JAR X LARGE (MISCELLANEOUS) ×3 IMPLANT
SPONGE GAUZE 4X4 12PLY STER LF (GAUZE/BANDAGES/DRESSINGS) ×1 IMPLANT
SPONGE LAP 18X18 X RAY DECT (DISPOSABLE) IMPLANT
STAPLER CUT CVD 40MM BLUE (STAPLE) ×1 IMPLANT
STAPLER PROXIMATE 75MM BLUE (STAPLE) ×1 IMPLANT
STAPLER VISISTAT 35W (STAPLE) ×3 IMPLANT
STENT CONTOUR 6FRX24X.038 (STENTS) ×1 IMPLANT
STENT URET 6FRX24 CONTOUR (STENTS) IMPLANT
SUCTION POOLE TIP (SUCTIONS) ×3 IMPLANT
SURGILUBE 2OZ TUBE FLIPTOP (MISCELLANEOUS) ×2 IMPLANT
SUT ETHILON 2 0 FS 18 (SUTURE) ×1 IMPLANT
SUT PDS AB 1 TP1 96 (SUTURE) ×6 IMPLANT
SUT PROLENE 2 0 CT2 30 (SUTURE) IMPLANT
SUT PROLENE 2 0 KS (SUTURE) IMPLANT
SUT PROLENE 2 0 SH 30 (SUTURE) ×1 IMPLANT
SUT SILK 0 TIES 10X30 (SUTURE) ×1 IMPLANT
SUT SILK 2 0 SH CR/8 (SUTURE) ×3 IMPLANT
SUT SILK 2 0 TIES 10X30 (SUTURE) ×3 IMPLANT
SUT SILK 3 0 SH CR/8 (SUTURE) ×4 IMPLANT
SUT SILK 3 0 TIES 10X30 (SUTURE) ×3 IMPLANT
SUT VIC AB 3-0 SH 18 (SUTURE) ×1 IMPLANT
SYR BULB IRRIGATION 50ML (SYRINGE) ×3 IMPLANT
SYR CONTROL 10ML LL (SYRINGE) ×3 IMPLANT
SYRINGE TOOMEY DISP (SYRINGE) ×1 IMPLANT
TOWEL OR 17X26 10 PK STRL BLUE (TOWEL DISPOSABLE) ×6 IMPLANT
TRAY FOLEY CATH 14FRSI W/METER (CATHETERS) ×2 IMPLANT
TRAY PROCTOSCOPIC FIBER OPTIC (SET/KITS/TRAYS/PACK) ×2 IMPLANT
TUBE CONNECTING 12X1/4 (SUCTIONS) ×3 IMPLANT
UNDERPAD 30X30 INCONTINENT (UNDERPADS AND DIAPERS) ×3 IMPLANT
WATER STERILE IRR 1000ML POUR (IV SOLUTION) ×3 IMPLANT
WIRE COONS/BENSON .038X145CM (WIRE) IMPLANT
YANKAUER SUCT BULB TIP NO VENT (SUCTIONS) ×3 IMPLANT

## 2013-07-03 NOTE — Anesthesia Preprocedure Evaluation (Addendum)
Anesthesia Evaluation  Patient identified by MRN, date of birth, ID band Patient awake    Reviewed: Allergy & Precautions, H&P , NPO status , Patient's Chart, lab work & pertinent test results, reviewed documented beta blocker date and time   History of Anesthesia Complications Negative for: history of anesthetic complications  Airway Mallampati: III TM Distance: >3 FB Neck ROM: Full    Dental  (+) Teeth Intact, Dental Advidsory Given,    Pulmonary shortness of breath, with exertion and at rest, COPD COPD inhaler and oxygen dependent, neg recent URI, former smoker,  breath sounds clear to auscultation        Cardiovascular hypertension, Pt. on medications and Pt. on home beta blockers + Peripheral Vascular Disease and +CHF + dysrhythmias Atrial Fibrillation Rhythm:Irregular Rate:Normal  afib failed cardioversion in 2010, normal ef, chronic anticoagulation   Neuro/Psych negative neurological ROS  negative psych ROS   GI/Hepatic Neg liver ROS, GERD-  Medicated and Controlled,  Endo/Other  diabetes, Type 2, Insulin DependentHypothyroidism   Renal/GU Renal Insufficiency and ARFRenal diseaseLikely entero vesicular fistula     Musculoskeletal   Abdominal   Peds  Hematology  (+) anemia ,   Anesthesia Other Findings   Reproductive/Obstetrics                         Anesthesia Physical Anesthesia Plan  ASA: III  Anesthesia Plan: General   Post-op Pain Management:    Induction: Intravenous  Airway Management Planned: Oral ETT  Additional Equipment: Arterial line, CVP and Ultrasound Guidance Line Placement  Intra-op Plan:   Post-operative Plan: Extubation in OR  Informed Consent: I have reviewed the patients History and Physical, chart, labs and discussed the procedure including the risks, benefits and alternatives for the proposed anesthesia with the patient or authorized representative who has  indicated his/her understanding and acceptance.   Dental advisory given and Dental Advisory Given  Plan Discussed with: CRNA, Surgeon and Anesthesiologist  Anesthesia Plan Comments:        Anesthesia Quick Evaluation

## 2013-07-03 NOTE — Progress Notes (Signed)
Patient ID: [name], female   DOB: 11/20/1930, 80 y.o.   MRN: [id number]  I was asked by Dr. Mena GoesEskridge to perform the urologic portion of her procedure today including cystoscopy and ureteral stent placement as well as possible closure of the bladder if indicated.  I have reviewed her recent imaging studies today and she does appear to have feculent matter in her bladder consistent with a colovesical fistula.  She is scheduled to undergo bowel diversion by Dr. Janee Mornhompson.   I discussed the potential benefits and risks of the procedure, side effects of the proposed treatment, the likelihood of the patient achieving the goals of the procedure, and any potential problems that might occur during the procedure or recuperation. She has agreed to proceed and gives her informed consent.

## 2013-07-03 NOTE — Anesthesia Procedure Notes (Signed)
Procedure Name: Intubation Date/Time: 06/25/2013 12:52 PM Performed by: Carmela RimaMARTINELLI, Pegeen Stiger F Pre-anesthesia Checklist: Patient identified, Timeout performed, Emergency Drugs available, Suction available and Patient being monitored Patient Re-evaluated:Patient Re-evaluated prior to inductionOxygen Delivery Method: Circle system utilized Preoxygenation: Pre-oxygenation with 100% oxygen Intubation Type: IV induction Ventilation: Mask ventilation without difficulty Laryngoscope Size: Mac and 3 Grade View: Grade III Tube type: Subglottic suction tube Tube size: 7.5 mm Number of attempts: 1 Placement Confirmation: positive ETCO2,  ETT inserted through vocal cords under direct vision and breath sounds checked- equal and bilateral Secured at: 21 cm Tube secured with: Tape Dental Injury: Teeth and Oropharynx as per pre-operative assessment

## 2013-07-03 NOTE — Progress Notes (Signed)
PARENTERAL NUTRITION CONSULT NOTE - FOLLOW UP  Pharmacy Consult for TPN Indication: colocutaneous fistula  Allergies  Allergen Reactions  . Sulfonamide Derivatives Other (See Comments)    Childhood reaction    Patient Measurements: Height: 5\' 3"  (160 cm) Weight: 184 lb 6.4 oz (83.643 kg) IBW/kg (Calculated) : 52.4 Adjusted Body Weight: 65kg  Vital Signs: Temp: 98.1 F (36.7 C) (03/25 0617) Temp src: Oral (03/25 0617) BP: 147/57 mmHg (03/25 0617) Pulse Rate: 80 (03/25 0617) Intake/Output from previous day: 03/24 0701 - 03/25 0700 In: 1900.3 [I.V.:650.3; IV Piggyback:50; TPN:1200] Out: 2025 [Urine:2025] Intake/Output from this shift:    Labs:  Recent Labs  07/01/13 0500 07/02/13 0455 06/22/2013 0500  WBC 8.9 10.0 9.9  HGB 7.8* 7.9* 7.7*  HCT 24.3* 25.0* 24.2*  PLT 305 337 349  INR 1.05  --   --      Recent Labs  07/01/13 0500 07/02/13 0455 06/22/2013 0500  NA 138 134* 135*  K 4.4 4.3 4.4  CL 93* 87* 87*  CO2 36* 35* 35*  GLUCOSE 184* 120* 132*  BUN 41* 46* 57*  CREATININE 1.12* 1.13* 1.15*  CALCIUM 9.1 9.2 9.3  MG 2.0 1.9  --   PHOS 3.8 4.0  --   PROT 6.6  --  6.6  ALBUMIN 2.1*  --  1.8*  AST 9  --  17  ALT 9  --  9  ALKPHOS 76  --  89  BILITOT 0.3  --  0.3  PREALBUMIN 17.4*  --   --   TRIG 336* 313*  --    Estimated Creatinine Clearance: 39.3 ml/min (by C-G formula based on Cr of 1.15).    Recent Labs  07/02/13 2347 06/30/2013 0307 07/05/2013 0806  GLUCAP 131* 122* 124*   Insulin Requirements in the past 24 hours:  13 units SSI  Current Nutrition:  Clinimix E 5/15 at 84mL/hr + 20% lipid emulsion at 68mL/hr on MWF only- this will provide an average of 90 gm protein and 1484 kCal daily  Nutritional Goals:  1600-1800 kCal, 80-95 grams of protein per day per RD recs on 3/21  Admit:  83 YOF recently hospitaliazed for PNA and flu and developed diverticulitis with abscess which required drain placement. This week her drain was attempted to be  upsized d/t stool in abscess cavity and she continued to have worsening pain and weakness.   GI: has been NPO except for medications; IR does not recommend repositioning current drain, an additional drain could be placed if patient does not have surgery. Drain output decreasing; surgery evaluated and is planning laparotomy and Hartmann resection 3/25. PO Pepcid, PO multivitamin. Prealbumin was 13.2 on 3/18- this admission it decreased to 10.5.  Endo: hx hypothyroid, TSH 06/22/2013 1.9, continues on Synthroid 50; hx DM- 06/22/2013 A1C 7%. CBGs improved; she is also on prednisone  Lytes: Na 135, K 4.4, Phos 4, Mag 1.9 - no indication of refeeding  Renal: hx CKD; SCr stable  Pulm: 3L Wilson; Spiriva, Pulmicort, Albuterol  Cards: VSS on bisoprolol, torsemide  Hepatobil: LFTs wnl except albumin is low at 1.8. Triglycerides remain elevated at 313.  Lipid provisions were adjusted 3/23.  Neuro:  A&O  ID: was on Flagyl and tigecycline at [facility], changed to ertapenem and vancomycin here- D#6 for both of those. WBC 10, afebrile.  Her Vancomycin regimen was adjusted 3/24 for a slightly elevated trough.  Anticoagulation: Afib - heparin infusion for bridging while Coumadin is on hold, was turned off this AM  at 0600 for surgery this afternoon.  Best Practices: heparin infusion for atrial fibrillation while Coumadin is on hold.  TPN Access: R PICC  TPN day#: 8 (started 3/17 in [facility])  Plan:  Continue Clinimix E 5/15 at 3275mL/hr. Continue 20% lipid emulsion at 5510mL/hr on MWF only as triglycerides remain elevated.  This will provide an average of 90 gm protein and 1484 kCal per day. Continue with 20 units of insulin in TPN bag. NO multivitamin or trace elements in TPN as patient is receiving an oral tablet [facility]  [facility] and resistant SSI q4h  CMET, Mag, Phos tomorrow morning Continue Vancomycin 500mg  IV q24h Continue Ertapenem 1gm IV q24h Follow-up post-operatively to determine  anticoagulation needs  Estella HuskMichelle Cheron Pasquarelli, Pharm.D., BCPS, AAHIVP Clinical Pharmacist Phone: [phone] or [phone] 06/09/2013, 9:17 AM

## 2013-07-03 NOTE — Progress Notes (Signed)
Subjective: Cough a bit better  Objective: Vital signs in last 24 hours: Temp:  [98.1 F (36.7 C)-98.4 F (36.9 C)] 98.1 F (36.7 C) (03/25 0617) Pulse Rate:  [80-88] 80 (03/25 0617) Resp:  [17-18] 17 (03/25 0617) BP: (136-147)/(57-72) 147/57 mmHg (03/25 0617) SpO2:  [95 %-100 %] 96 % (03/25 0806) FiO2 (%):  [32 %] 32 % (03/24 1449) Weight:  [184 lb 6.4 oz (83.643 kg)] 184 lb 6.4 oz (83.643 kg) (03/25 0617) Last BM Date: 07/02/13  Intake/Output from previous day: 03/24 0701 - 03/25 0700 In: 1900.3 [I.V.:650.3; IV Piggyback:50; TPN:1200] Out: 2025 [Urine:2025] Intake/Output this shift:    General appearance: cooperative Resp: wheeze L>R Cardio: S1, S2 normal GI: soft, minimal lower TTP, erythema around drain site L lateral is less, mild tenderness and stool drainage there Neuro: F/C  Lab Results:   Recent Labs  07/02/13 0455 07/04/2013 0500  WBC 10.0 9.9  HGB 7.9* 7.7*  HCT 25.0* 24.2*  PLT 337 349   BMET  Recent Labs  07/02/13 0455 06/11/2013 0500  NA 134* 135*  K 4.3 4.4  CL 87* 87*  CO2 35* 35*  GLUCOSE 120* 132*  BUN 46* 57*  CREATININE 1.13* 1.15*  CALCIUM 9.2 9.3   PT/INR  Recent Labs  07/01/13 0500  LABPROT 13.5  INR 1.05   ABG No results found for this basename: PHART, PCO2, PO2, HCO3,  in the last 72 hours  Studies/Results: Dg Chest Port 1 View  07/02/2013   CLINICAL DATA:  Cough and congestion.  EXAM: PORTABLE CHEST - 1 VIEW  COMPARISON:  06/27/2013  FINDINGS: PICC line tip in the SVC region. Again noted is volume loss in the left hemithorax. There are enlarged interstitial densities bilaterally. Findings suggest interstitial edema. Persistent densities at the left lung base appear to be chronic and related to volume loss and possibly cardiophrenic fat. However, there is haziness at the right lung base and small pleural effusions cannot be excluded.  IMPRESSION: Prominent interstitial lung markings suggestive interstitial pulmonary edema.   Densities at the lung bases and cannot exclude small pleural effusions, particularly on the right side.   Electronically Signed   By: Richarda Overlie M.D.   On: 07/02/2013 08:21    Anti-infectives: Anti-infectives   Start     Dose/Rate Route Frequency Ordered Stop   06/09/2013 0600  vancomycin (VANCOCIN) 500 mg in sodium chloride 0.9 % 100 mL IVPB     500 mg 100 mL/hr over 60 Minutes Intravenous Every 24 hours 07/02/13 0230     07/02/13 1200  fluconazole (DIFLUCAN) IVPB 200 mg     200 mg 100 mL/hr over 60 Minutes Intravenous Every 24 hours 07/02/13 1128     06/11/2013 2300  ertapenem (INVANZ) 1 g in sodium chloride 0.9 % 50 mL IVPB     1 g 100 mL/hr over 30 Minutes Intravenous Every 24 hours 06/19/2013 2142     07/09/2013 2200  vancomycin (VANCOCIN) IVPB 750 mg/150 ml premix  Status:  Discontinued     750 mg 150 mL/hr over 60 Minutes Intravenous Every 24 hours 06/27/2013 2104 07/02/13 0230      Assessment/Plan: Diverticulitis with abscess and colocutaneous and colovesical fistula.  Plans for laparotomy, hartmans resection with colostomy today. Procedure, risks, benefits D/W her daughter again. Consent obtained. Urology to place stents, RPG, assist with closure of colovesicle fistula. I advised family we plan ICU admit post-op, possibly on vent. TNA  NPO  [facility]  Vanc  for cellulitis surrounding the drain, much improved  We requested cardiology clearance. I spoke with Dr. Teressa LowerBensimohn who stated that the recommendations would not change from her previous evaluation in February and re-consult post operatively if needed.  ANemia of chronic disease - TF 2u PRBC now. I D/W her daughter. Yeast UTI - on fluconazole Hypokalemia-supplement PRN  Deconditioning-PT/OT DM-TPN, CBGs and SSI  Atrial fibrillation- heparin drip held for surgery today COPD, home 02-appreciate CCM assistance, will have them follow in ICU post-op Chronic diastolic dysfunction-strict I&Os, daily weights  CKD  -baseline  OSA -CPAP  Hypothyroidism-home meds  Appreciate IM assistance with chronic medical problems  LOS: 5 days    Donnika Kucher E 06/20/2013

## 2013-07-03 NOTE — Consult Note (Signed)
WOC wound consult note Reason for Consult: Consult requested for colostomy stoma site marking, surgery scheduled for today.  Pt does not want to talk about the surgery or ask questions.  Daughter at bedside is a Publishing rights managernurse practitioner and states she is familiar with pouch application and emptying.   Pt feels very sick and weak and is unable to stand or sit on the side of the bed. Assessed while lying down and HOB as high as possible.  Mark placed within rectus muscles, in line of vision, to area free from folds.  LLQ 5cm to left of umbilicus and one cm below.  Educational materials left at bedside for pt and family and demonstrated pouch use.  Briefly discussed pouching routines. Will plan to follow post-op when stable for educational sessions. Cammie Mcgeeawn Lorine Iannaccone MSN, RN, CWOCN, [place], CNS [phone]

## 2013-07-03 NOTE — Op Note (Signed)
Preoperative diagnosis: Diverticulitis with diverticular abscess and possible colovesical fistula  Postoperative diagnosis: Diverticulitis with diverticular abscess and possible colovesical fistula  Procedure: 1.  Cystoscopy 2.  Bilateral retrograde pyelography with interpretation 3.  Bilateral ureteral stent placement (Left 6 x 24 Contour, Right 6 Fr open ended ureteral catheter)  Surgeon: Dr. Rolly Salter, Montez Hageman.  Anesthesia: General  Complications: None  Intraoperative findings: Left retrograde pyelography demonstrated a normal caliber ureter without evidence of filling defects.  The left renal collecting system also appeared normal without hydronephrosis or filling defects.  Right retrograde pyelography demonstrated a normal caliber ureter without filling defects.  The right renal pelvis was noted to be slightly dilated.  Although there were no obvious filling defects, the upper pole calyx was not very well visualized.  Indication: [name] is an 80 year old female who was admitted to the hospital with diverticulitis and a diverticular abscess.  There also is concerned about a colovesical fistula based on the fact that her urethral catheter has been draining feculent material. She was evaluated by my partner, Dr. Jerilee Field, earlier this week at the request of Dr. Janee Morn and urology was requested to place ureteral stents preoperatively to help with identification of the ureters as well as to assist with any potential closure of the bladder necessary.  I had a discussion with [name] and reviewed the potential risks, complications, and the expected recovery process associated with my portion of the procedure.  She gave her informed consent to proceed as planned.  Description of procedure: The patient was taken to the operating room and a general anesthetic was administered.  She was given preoperative antibiotics including broad-spectrum therapy which she has been on during her  hospitalization and fluconazole which was started yesterday based on a recent yeast urine culture.  She was placed in the dorsal lithotomy position and prepped and draped in the usual sterile fashion.  Next, cystourethroscopy was performed with a 12 and 70 lens.  There was noted to be some bullous edema toward the left posterior portion of the bladder although no clear fistula was identified.  Attention turned to the left ureteral orifice which was intubated with a 6 French ureteral catheter.  Omnipaque contrast was injected to perform a retrograde pyelogram.  Findings are as dictated above.  A .038 sensor guidewire was then advanced up the left ureter and into the left renal pelvis under fluoroscopic guidance.  A 6 x 24 double-J ureteral stent was then advanced over the wire using Seldinger technique and positioned appropriately under fluoroscopic and cystoscopic guidance.  The wire was then removed with an adequate curl noted in the renal pelvis as well as within the bladder.  Attention then turned to the right ureteral orifice which was also cannulated with a 6 French ureteral catheter.  Omnipaque contrast was injected and a retrograde pyelogram was performed again with findings as dictated above. A 0.038 sensor guidewire was advanced up the right ureter into the right renal pelvis under fluoroscopic guidance.  The 6 French ureteral catheter was then advanced over the wire and positioned within the renal pelvis.  The wire was then removed.  Further inspection of the bladder was then again performed with the 70 lens.  Again, no clear fistula tract was identified although there was a small area of bullous edema located toward the left lateral aspect of the bladder raising concern for a possible fistula.  The cystoscope was then removed and a 16 French urethral catheter was placed.  The  open-ended right ureteral externalized stent secured to the catheter with a silk suture.  The catheter and the open-ended  ureteral stent were then drained into a urine meter utilizing a Layla BarterGoldberg adapter.  At this point, the procedure was turned over to Dr. Janee Mornhompson.  During the exploratory laparotomy, no obvious fistula was noted by Dr. Janee Mornhompson.  I did recommend and he proceeded with placing 200 cc of saline with methylene blue into the bladder to see if obvious extravasation could be demonstrated.  No blue dye was seen to extravasate from the bladder indicating that any colovesical fistula was present was probably quite small.  Furthermore, there was significant inflammation was not felt that a formal repair may be very successful considering the acute nature of the inflammatory process.  I therefore recommended that an indwelling catheter be left in place and a pelvic extravesical drain be placed.

## 2013-07-03 NOTE — Anesthesia Postprocedure Evaluation (Signed)
  Anesthesia Post-op Note  Patient: Heidi HanlonCynthia A Burch  Procedure(s) Performed: Procedure(s): SIGMOID COLECTOMY; HARTMANN'S PROCEDURE WITH COLOSTOMY; I&D LEFT FLANK DRAIN SITE (N/A) CYSTOSCOPY WITH BILATERAL RETROGRADE PYELOGRAM/URETERAL STENT PLACEMENT  (Bilateral)  Patient Location: ICU  Anesthesia Type:General  Level of Consciousness: sedated  Airway and Oxygen Therapy: Patient remains intubated per anesthesia plan  Post-op Pain: none  Post-op Assessment: Post-op Vital signs reviewed, Patient's Cardiovascular Status Stable and Respiratory Function Stable  Post-op Vital Signs: Reviewed and stable  Complications: No apparent anesthesia complications

## 2013-07-03 NOTE — Progress Notes (Signed)
PULMONARY / CRITICAL CARE MEDICINE  Name: [name] MRN: [id number] DOB: 02-15-1931    ADMISSION DATE:  07/05/2013 CONSULTATION DATE: 06/17/2013  REFERRING MD :  EDP PRIMARY SERVICE:  PCCM  CHIEF COMPLAINT:  Chronic lung disease / perioperative management  BRIEF PATIENT DESCRIPTION:  80 yo with COPD admitted 3/20 with diverticular abscess s/p sigmoid colectomy, colostomy, cystoscopy and urethral stent placement.  SIGNIFICANT EVENTS / STUDIES:  3/25  OR >>>  Sigmoid colectomy, colostomy, cystoscopy and urethral stent placement.  LINES / TUBES: OETT 3/25 >>> OGT 3/25 >>> Foley 3/25 >>> R IJ CVL 3/25 >>> R PICC ??? >>> L R A-line 3/25 >>>  CULTURES: 3/22 Urine >>> YEAST  ANTIBIOTICS: Invanz 3/25 >>> Vancomycin 3/25 >>> Diflucan 3/25 >>>  INTERVAL HISTORY:  Back from OR intubated.  VITAL SIGNS: Temp:  [97.8 F (36.6 C)-98.5 F (36.9 C)] 97.9 F (36.6 C) (03/25 1145) Pulse Rate:  [63-126] 63 (03/25 1628) Resp:  [16-19] 18 (03/25 1154) BP: (114-166)/(50-82) 166/74 mmHg (03/25 1628) SpO2:  [96 %-100 %] 100 % (03/25 1154) FiO2 (%):  [50 %] 50 % (03/25 1630) Weight:  [83.643 kg (184 lb 6.4 oz)] 83.643 kg (184 lb 6.4 oz) (03/25 0617) HEMODYNAMICS:   VENTILATOR SETTINGS: Vent Mode:  [-] PRVC FiO2 (%):  [50 %] 50 % Set Rate:  [16 bmp] 16 bmp Vt Set:  [420 mL] 420 mL PEEP:  [5 cmH20] 5 cmH20 Plateau Pressure:  [21 cmH20] 21 cmH20 INTAKE / OUTPUT: Intake/Output     03/24 0701 - 03/25 0700 03/25 0701 - 03/26 0700   I.V. (mL/kg) 650.3 (7.8) 900 (10.8)   Blood  1341   IV Piggyback 50    TPN 1200    Total Intake(mL/kg) 1900.3 (22.7) 2241 (26.8)   Urine (mL/kg/hr) 2025 (1) 75 (0.1)   Drains     Blood  200 (0.2)   Total Output 2025 275   Net -124.7 +1966          PHYSICAL EXAMINATION: General:  Appears acutely ill, mechanically ventilated, synchronous Neuro:  Encephalopathic, nonfocal, cough / gag diminished HEENT:  PERRL, OETT / OGT Cardiovascular:  RRR,  no m/r/g Lungs:  Bilateral diminished air entry, no w/r/r Abdomen:  Surgical depressing dry, minimal JP drainage, bowel sounds absent Musculoskeletal:  Trace edema Skin:  Intact  LABS: CBC  Recent Labs Lab 07/01/13 0500 07/02/13 0455 06/24/2013 0500  WBC 8.9 10.0 9.9  HGB 7.8* 7.9* 7.7*  HCT 24.3* 25.0* 24.2*  PLT 305 337 349   Coag's  Recent Labs Lab 06/29/13 0519 06/30/13 0500 07/01/13 0500  INR 2.89* 2.95* 1.05   BMET  Recent Labs Lab 07/01/13 0500 07/02/13 0455 06/09/2013 0500  NA 138 134* 135*  K 4.4 4.3 4.4  CL 93* 87* 87*  CO2 36* 35* 35*  BUN 41* 46* 57*  CREATININE 1.12* 1.13* 1.15*  GLUCOSE 184* 120* 132*   Electrolytes  Recent Labs Lab 06/30/13 0500 07/01/13 0500 07/02/13 0455 06/12/2013 0500  CALCIUM 8.6 9.1 9.2 9.3  MG 1.6 2.0 1.9  --   PHOS 2.7 3.8 4.0  --    Sepsis Markers No results found for this basename: LATICACIDVEN, PROCALCITON, O2SATVEN,  in the last 168 hours ABG No results found for this basename: PHART, PCO2ART, PO2ART,  in the last 168 hours Liver Enzymes  Recent Labs Lab 06/29/13 0519 07/01/13 0500 06/09/2013 0500  AST 9 9 17   ALT 9 9 9   ALKPHOS 81 76 89  BILITOT 0.3 0.3  0.3  ALBUMIN 1.7* 2.1* 1.8*   Cardiac Enzymes No results found for this basename: TROPONINI, PROBNP,  in the last 168 hours Glucose  Recent Labs Lab 07/02/13 1155 07/02/13 1613 07/02/13 1955 07/02/13 2347 06/23/2013 0307 07/02/2013 0806  GLUCAP 203* 244* 182* 131* 122* 124*   IMAGING: Dg Chest Port 1 View  07/02/2013   CLINICAL DATA:  Cough and congestion.  EXAM: PORTABLE CHEST - 1 VIEW  COMPARISON:  06/27/2013  FINDINGS: PICC line tip in the SVC region. Again noted is volume loss in the left hemithorax. There are enlarged interstitial densities bilaterally. Findings suggest interstitial edema. Persistent densities at the left lung base appear to be chronic and related to volume loss and possibly cardiophrenic fat. However, there is haziness at the  right lung base and small pleural effusions cannot be excluded.  IMPRESSION: Prominent interstitial lung markings suggestive interstitial pulmonary edema.  Densities at the lung bases and cannot exclude small pleural effusions, particularly on the right side.   Electronically Signed   By: Richarda OverlieAdam  Henn M.D.   On: 07/02/2013 08:21   ASSESSMENT / PLAN:  PULMONARY A:   Acute respirator failure  COPD without exacerbation P:   Goal SpO2>92, pH>7.30 Full mechanical support Daily SBT Ventilator bundle Trend ABG / CXR DuoNeb / Pulmicort scheduled, Albuterol PRN D/c low dose prednisone and start stress dose steroids  CARDIOVASCULAR A:  AF HTN Chronic diastolic heart failure P:  Start Metoprolol 5 q6h Hold Zebeta as NPO Hold anticoagulation as recent surgery   RENAL A:   CKD S/p urethral stent placement? Op note pending. Hematuria P:   Trend BMP Hold Torsemide Per Urology  GASTROINTESTINAL A:   Diverticulitis with abscess / fistula s/o sigmoid colectomy, colostomy Nutrition GI Px P:   NPO TNA Per CCS Pepcid  HEMATOLOGIC A:   Therapeutic anticoagulation for EF prior to surgery VTE Px P:  Trend CBC SCD Hold Heparin / Coumadin for now  INFECTIOUS A:   Intraabdominal abscess P:   Cx / abx as above  ENDOCRINE  A:   DM Hyperglycemia Hypothyroidism Suspected adrenal insufficiency - chronic steroids P:   SSI Synthroid, convert to IV Hydrocortisone  NEUROLOGIC A:   Post op pain Vent synchrony P:   Goal RASS 0 to -1 Daily WUA Fentanyl gtt  I have personally obtained history, examined patient, evaluated and interpreted laboratory and imaging results, reviewed medical records, formulated assessment / plan and placed orders.  CRITICAL CARE:  The patient is critically ill with multiple organ systems failure and requires high complexity decision making for assessment and support, frequent evaluation and titration of therapies, application of advanced  monitoring technologies and extensive interpretation of multiple databases. Critical Care Time devoted to patient care services described in this note is 45 minutes.   Lonia FarberZUBELEVITSKIY, Elvera Almario, MD Pulmonary and Critical Care Medicine [facility] Pager: [phone]  06/12/2013, 4:50 PM

## 2013-07-03 NOTE — Progress Notes (Signed)
07/09/2013 1114  OT Visit Information  Last OT Received On 06/27/2013  Reason Eval/Treat Not Completed Patient at procedure or test/ unavailable    [name], OTR/L [phone]

## 2013-07-03 NOTE — Progress Notes (Signed)
PT Cancellation Note  Patient Details Name: [name] MRN: [id number] DOB: 07/04/1930   Cancelled Treatment:     PT deferred this date.  Pt for surgery.  Will follow in am.   Maizy Davanzo 06/13/2013, 4:13 PM

## 2013-07-03 NOTE — Transfer of Care (Signed)
Immediate Anesthesia Transfer of Care Note  Patient: Heidi HanlonCynthia A Burch  Procedure(s) Performed: Procedure(s): SIGMOID COLECTOMY; HARTMANN'S PROCEDURE WITH COLOSTOMY; I&D LEFT FLANK DRAIN SITE (N/A) CYSTOSCOPY WITH BILATERAL RETROGRADE PYELOGRAM/URETERAL STENT PLACEMENT  (Bilateral)  Patient Location: SICU  Anesthesia Type:General  Level of Consciousness: sedated and Patient remains intubated per anesthesia plan  Airway & Oxygen Therapy: Patient remains intubated per anesthesia plan and Patient placed on Ventilator (see vital sign flow sheet for setting)  Post-op Assessment: Report given to PACU RN and Post -op Vital signs reviewed and stable  Post vital signs: Reviewed and stable  Complications: No apparent anesthesia complications

## 2013-07-03 NOTE — Preoperative (Signed)
Beta Blockers   Reason not to administer Beta Blockers:Not Applicable 

## 2013-07-03 NOTE — Op Note (Signed)
06/10/2013 - 07/02/2013  3:51 PM  PATIENT:  [name]  80 y.o. female  PRE-OPERATIVE DIAGNOSIS:  DIVERTICULITIS WITH ABSCESS AND COLOVESICLE FISTULA   POST-OPERATIVE DIAGNOSIS:  DIVERTICULITIS WITH ABSCESS AND COLOVESICLE FISTULA   PROCEDURE:  Procedure(s): SIGMOID COLECTOMY; HARTMANN'S PROCEDURE WITH COLOSTOMY; I&D LEFT FLANK DRAIN SITE  SURGEON:  Violeta Gelinas, MD  ASSISTANTS: Almond Lint, MD   ANESTHESIA:   general  EBL:  Total I/O In: 1441 [I.V.:100; Blood:1341] Out: 175 [Urine:75; Blood:100]  BLOOD ADMINISTERED:4U CC PRBC  DRAINS: Penrose drain in the L flank JP RLQ  SPECIMEN:  Excision  DISPOSITION OF SPECIMEN:  PATHOLOGY  COUNTS:  YES  DICTATION: .Dragon Dictation Patient was identified in preop holding area. Informed consent was obtained. She is on IV antibiotic protocol. She was brought to the operating room and general endotracheal anesthesia was administered by the anesthesia staff. Dr. Laverle Patter from urology Began with his procedure first. That is dictated separately. Once he completed, the abdomen was prepped and draped in sterile fashion. We did a time out procedure. Midline incision was made from above the umbilicus down below. Subcutaneous tissues were dissected down revealing the anterior fascia. This was divided along the midline and the perineal cavity was entered under direct vision. The fascia was opened the length of the skin incision. She was placed in Trendelenburg. We mobilized some filmy adhesions out of the left lower quadrant and freed up the bowel. The distal sigmoid down into the pelvis is very hard and inflamed. This seemed to be stuck to the left pelvic sidewall just beneath the left fallopian tube off the uterus. More proximally, the sigmoid colon was normal. It was divided with GIA-75 stapler. We took down some of the mesentery with the LigaSure and suture ligation getting good hemostasis. We stayed along the midline.Next, the inflamed area  along the left pelvic sidewall  was carefully taken down with blunt dissection and cautery. This seemed to enter an abscess cavity in that area. Careful dissection was able to free up the colon away from the area. There was no obvious connection to the bladder. The colon was further freed up out of the pelvis for several centimeters distal to the problem area. The remainder of the mesentery was taken down with a combination of LigaSure and suture ligation. We stayed along the colon wall and avoid the ureter areas which were palpable with stents. We divided the colon distally with a contour stapler. The specimen was marked for pathology and sent. We put a 2-0 Prolene on the rectosigmoid cuff as a marker. Next we closely investigated area of inflammation in the bladder. No site could be discerned as a colovesical fistula. We discussed this with Dr. Laverle Patter. We have 200 cc of saline with methylene blue through the Foley distending the bladder. No blue fluid came into the pelvis. After thorough investigation and no blue dye noted, decision was made to leave a 19 [place] Blake drain in the pelvis. We changed our gloves. The remaining colon was mobilized some from its lateral peritoneal attachments and we took a little more the mesentery. It remained pink and viable. It seemed to reach nicely to the left lower quadrant. A circular incision was made in the skin and the fat was cored out down to the fascia. A cruciate incision was made in the fascia and this was continued through into the abdomen. Distal colon was brought out and it was tacked to the fascia outside with 2-0 silk sutures. It was not  on tension & it was pink. Abdomen was copiously irrigated with numerous liters of saline. Drain as above was left in the pelvis. Bowel was returned to anatomic position. There was no bleeding. The fascia was closed with 2 lengths of running #1 loop PDS and tied in the middle. Skin was left open. Colostomy was matured with 3-0  Vicry. It remained viable. Next, the drain site On the left flank was prepped in a sterile fashion. The opening in the skin was enlarged with cautery. Suction was used To clear the drain path. It was copiously irrigated. Half inch Penrose drain was then inserted and sutured to the skin. Sterile dressings were applied. Ostomy appliance was applied. All counts were correct. Per urology instruction, green right ureteral stent was removed at the completion of the case. There were no apparent complications. Patient was taken intubated to the surgical intensive care unit and I consulted critical care medicine and spoke with Dr. Delford FieldWright.  PATIENT DISPOSITION:  ICU - intubated and hemodynamically stable.   Delay start of Pharmacological VTE agent (>24hrs) due to surgical blood loss or risk of bleeding:  no  Violeta GelinasBurke [name], MD, MPH, FACS Pager: [phone]  3/25/20153:51 PM

## 2013-07-04 ENCOUNTER — Encounter (HOSPITAL_COMMUNITY): Admission: EM | Disposition: E | Payer: Self-pay | Source: Ambulatory Visit

## 2013-07-04 ENCOUNTER — Encounter (HOSPITAL_COMMUNITY): Payer: Medicare Other | Admitting: Anesthesiology

## 2013-07-04 ENCOUNTER — Encounter (HOSPITAL_COMMUNITY): Payer: Self-pay | Admitting: Anesthesiology

## 2013-07-04 ENCOUNTER — Inpatient Hospital Stay (HOSPITAL_COMMUNITY): Payer: Medicare Other

## 2013-07-04 ENCOUNTER — Encounter (HOSPITAL_COMMUNITY): Payer: Self-pay

## 2013-07-04 ENCOUNTER — Inpatient Hospital Stay (HOSPITAL_COMMUNITY): Payer: Medicare Other | Admitting: Anesthesiology

## 2013-07-04 DIAGNOSIS — E46 Unspecified protein-calorie malnutrition: Secondary | ICD-10-CM

## 2013-07-04 DIAGNOSIS — I4891 Unspecified atrial fibrillation: Secondary | ICD-10-CM

## 2013-07-04 LAB — TYPE AND SCREEN
ABO/RH(D): O POS
ANTIBODY SCREEN: NEGATIVE
UNIT DIVISION: 0
Unit division: 0
Unit division: 0
Unit division: 0

## 2013-07-04 LAB — BLOOD GAS, ARTERIAL
Acid-Base Excess: 3.5 mmol/L — ABNORMAL HIGH (ref 0.0–2.0)
BICARBONATE: 28.7 meq/L — AB (ref 20.0–24.0)
Drawn by: 34775
FIO2: 0.4 %
MECHVT: 420 mL
O2 Saturation: 98.5 %
PCO2 ART: 53 mmHg — AB (ref 35.0–45.0)
PEEP: 5 cmH2O
PH ART: 7.352 (ref 7.350–7.450)
Patient temperature: 98.6
RATE: 16 resp/min
TCO2: 30.3 mmol/L (ref 0–100)
pO2, Arterial: 118 mmHg — ABNORMAL HIGH (ref 80.0–100.0)

## 2013-07-04 LAB — GLUCOSE, CAPILLARY
GLUCOSE-CAPILLARY: 292 mg/dL — AB (ref 70–99)
GLUCOSE-CAPILLARY: 263 mg/dL — AB (ref 70–99)
GLUCOSE-CAPILLARY: 302 mg/dL — AB (ref 70–99)
GLUCOSE-CAPILLARY: 250 mg/dL — AB (ref 70–99)
GLUCOSE-CAPILLARY: 121 mg/dL — AB (ref 70–99)
GLUCOSE-CAPILLARY: 119 mg/dL — AB (ref 70–99)
Glucose-Capillary: 233 mg/dL — ABNORMAL HIGH (ref 70–99)
Glucose-Capillary: 286 mg/dL — ABNORMAL HIGH (ref 70–99)
Glucose-Capillary: 252 mg/dL — ABNORMAL HIGH (ref 70–99)
Glucose-Capillary: 214 mg/dL — ABNORMAL HIGH (ref 70–99)
Glucose-Capillary: 130 mg/dL — ABNORMAL HIGH (ref 70–99)

## 2013-07-04 LAB — COMPREHENSIVE METABOLIC PANEL
ALK PHOS: 102 U/L (ref 39–117)
ALT: 14 U/L (ref 0–35)
AST: 21 U/L (ref 0–37)
Albumin: 1.8 g/dL — ABNORMAL LOW (ref 3.5–5.2)
BUN: 64 mg/dL — ABNORMAL HIGH (ref 6–23)
CO2: 30 mEq/L (ref 19–32)
Calcium: 8.4 mg/dL (ref 8.4–10.5)
Chloride: 91 mEq/L — ABNORMAL LOW (ref 96–112)
Creatinine, Ser: 1.21 mg/dL — ABNORMAL HIGH (ref 0.50–1.10)
GFR, EST AFRICAN AMERICAN: 47 mL/min — AB (ref >90)
GFR, EST NON AFRICAN AMERICAN: 41 mL/min — AB (ref >90)
GLUCOSE: 251 mg/dL — AB (ref 70–99)
POTASSIUM: 5.4 meq/L — AB (ref 3.7–5.3)
SODIUM: 132 meq/L — AB (ref 137–147)
Total Bilirubin: 0.2 mg/dL — ABNORMAL LOW (ref 0.3–1.2)
Total Protein: 6.6 g/dL (ref 6.0–8.3)

## 2013-07-04 LAB — POCT I-STAT 7, (LYTES, BLD GAS, ICA,H+H)
Acid-Base Excess: 4 mmol/L — ABNORMAL HIGH (ref 0.0–2.0)
Bicarbonate: 29 mEq/L — ABNORMAL HIGH (ref 20.0–24.0)
Calcium, Ion: 1.05 mmol/L — ABNORMAL LOW (ref 1.13–1.30)
HCT: 28 % — ABNORMAL LOW (ref 36.0–46.0)
Hemoglobin: 9.5 g/dL — ABNORMAL LOW (ref 12.0–15.0)
O2 Saturation: 100 %
Potassium: 4.1 mEq/L (ref 3.7–5.3)
Sodium: 128 mEq/L — ABNORMAL LOW (ref 137–147)
TCO2: 30 mmol/L (ref 0–100)
pCO2 arterial: 45.5 mmHg — ABNORMAL HIGH (ref 35.0–45.0)
pH, Arterial: 7.412 (ref 7.350–7.450)
pO2, Arterial: 181 mmHg — ABNORMAL HIGH (ref 80.0–100.0)

## 2013-07-04 LAB — HEPARIN LEVEL (UNFRACTIONATED)

## 2013-07-04 LAB — CBC
HCT: 34.1 % — ABNORMAL LOW (ref 36.0–46.0)
Hemoglobin: 11.4 g/dL — ABNORMAL LOW (ref 12.0–15.0)
MCH: 29.3 pg (ref 26.0–34.0)
MCHC: 33.4 g/dL (ref 30.0–36.0)
MCV: 87.7 fL (ref 78.0–100.0)
PLATELETS: 306 10*3/uL (ref 150–400)
RBC: 3.89 MIL/uL (ref 3.87–5.11)
RDW: 20.9 % — AB (ref 11.5–15.5)
WBC: 16.5 10*3/uL — ABNORMAL HIGH (ref 4.0–10.5)

## 2013-07-04 LAB — POCT ACTIVATED CLOTTING TIME
ACTIVATED CLOTTING TIME: 143 s
Activated Clotting Time: 132 seconds

## 2013-07-04 LAB — PHOSPHORUS: Phosphorus: 5.5 mg/dL — ABNORMAL HIGH (ref 2.3–4.6)

## 2013-07-04 LAB — MAGNESIUM: Magnesium: 2 mg/dL (ref 1.5–2.5)

## 2013-07-04 SURGERY — RADIOLOGY WITH ANESTHESIA
Anesthesia: General

## 2013-07-04 MED ORDER — METOPROLOL TARTRATE 1 MG/ML IV SOLN
5.0000 mg | INTRAVENOUS | Status: DC
  Administered 2013-07-04 – 2013-07-06 (×11): 5 mg via INTRAVENOUS
  Filled 2013-07-04 (×14): qty 5

## 2013-07-04 MED ORDER — DEXTROSE 10 % IV SOLN
INTRAVENOUS | Status: DC
  Filled 2013-07-04: qty 1000

## 2013-07-04 MED ORDER — VECURONIUM BROMIDE 10 MG IV SOLR
INTRAVENOUS | Status: DC | PRN
  Administered 2013-07-04: 5 mg via INTRAVENOUS

## 2013-07-04 MED ORDER — M.V.I. ADULT IV INJ
INTRAVENOUS | Status: AC
  Administered 2013-07-04: 20:00:00 via INTRAVENOUS
  Filled 2013-07-04: qty 2000

## 2013-07-04 MED ORDER — CHLORHEXIDINE GLUCONATE 0.12 % MT SOLN
OROMUCOSAL | Status: AC
  Filled 2013-07-04: qty 15

## 2013-07-04 MED ORDER — ACETAMINOPHEN 650 MG RE SUPP: 650.0000 mg | Freq: Four times a day (QID) | RECTAL | Status: DC | PRN

## 2013-07-04 MED ORDER — ONDANSETRON HCL 4 MG/2ML IJ SOLN: 4.0000 mg | Freq: Four times a day (QID) | INTRAMUSCULAR | Status: DC | PRN

## 2013-07-04 MED ORDER — MORPHINE SULFATE 2 MG/ML IJ SOLN: 2.0000 mg | INTRAMUSCULAR | Status: DC | PRN

## 2013-07-04 MED ORDER — SODIUM CHLORIDE 0.9 % IV SOLN
100.0000 [IU] | INTRAVENOUS | Status: DC | PRN
  Administered 2013-07-04: 7.7 [IU]/h via INTRAVENOUS

## 2013-07-04 MED ORDER — SODIUM CHLORIDE 0.9 % IV SOLN
INTRAVENOUS | Status: DC
  Administered 2013-07-04: 3.1 [IU]/h via INTRAVENOUS
  Administered 2013-07-04: 2.4 [IU]/h via INTRAVENOUS
  Filled 2013-07-04 (×2): qty 1

## 2013-07-04 MED ORDER — FUROSEMIDE 10 MG/ML IJ SOLN
60.0000 mg | Freq: Once | INTRAMUSCULAR | Status: AC
  Administered 2013-07-04: 60 mg via INTRAVENOUS
  Filled 2013-07-04: qty 6

## 2013-07-04 MED ORDER — SODIUM CHLORIDE 0.9 % IV SOLN
INTRAVENOUS | Status: DC
Start: 2013-07-04 — End: 2013-07-06
  Administered 2013-07-04 – 2013-07-06 (×2): via INTRAVENOUS

## 2013-07-04 MED ORDER — MIDAZOLAM HCL 2 MG/2ML IJ SOLN
INTRAMUSCULAR | Status: AC | PRN
  Administered 2013-07-04 (×2): 1 mg via INTRAVENOUS

## 2013-07-04 MED ORDER — NITROGLYCERIN 1 MG/10 ML FOR IR/CATH LAB
INTRA_ARTERIAL | Status: AC
  Filled 2013-07-04: qty 10

## 2013-07-04 MED ORDER — ALTEPLASE 30 MG/30 ML FOR INTERV. RAD
1.0000 mg | INTRA_ARTERIAL | Status: AC
  Administered 2013-07-04: 15 mg via INTRA_ARTERIAL
  Filled 2013-07-04: qty 30

## 2013-07-04 MED ORDER — CHLORHEXIDINE GLUCONATE 0.12 % MT SOLN
15.0000 mL | Freq: Two times a day (BID) | OROMUCOSAL | Status: DC
  Administered 2013-07-04 – 2013-07-06 (×5): 15 mL via OROMUCOSAL
  Filled 2013-07-04 (×5): qty 15

## 2013-07-04 MED ORDER — BIOTENE DRY MOUTH MT LIQD
15.0000 mL | Freq: Four times a day (QID) | OROMUCOSAL | Status: DC
  Administered 2013-07-04 – 2013-07-06 (×8): 15 mL via OROMUCOSAL

## 2013-07-04 MED ORDER — SODIUM CHLORIDE 0.9 % IV SOLN
INTRAVENOUS | Status: DC | PRN
  Administered 2013-07-04: 17:00:00 via INTRAVENOUS

## 2013-07-04 MED ORDER — MIDAZOLAM HCL 2 MG/2ML IJ SOLN
INTRAMUSCULAR | Status: AC
  Filled 2013-07-04: qty 2

## 2013-07-04 MED ORDER — FENTANYL CITRATE 0.05 MG/ML IJ SOLN
INTRAMUSCULAR | Status: AC | PRN
  Administered 2013-07-04 (×2): 25 ug via INTRAVENOUS

## 2013-07-04 MED ORDER — HEPARIN (PORCINE) IN NACL 100-0.45 UNIT/ML-% IJ SOLN
1350.0000 [IU]/h | INTRAMUSCULAR | Status: DC
  Administered 2013-07-04: 1350 [IU]/h via INTRAVENOUS
  Filled 2013-07-04 (×2): qty 250

## 2013-07-04 MED ORDER — ACETAMINOPHEN 500 MG PO TABS: 1000.0000 mg | ORAL_TABLET | Freq: Four times a day (QID) | ORAL | Status: DC | PRN

## 2013-07-04 MED ORDER — NICARDIPINE HCL IN NACL 20-0.86 MG/200ML-% IV SOLN
5.0000 mg/h | INTRAVENOUS | Status: DC
Start: 2013-07-04 — End: 2013-07-06
  Administered 2013-07-04 – 2013-07-06 (×3): 5 mg/h via INTRAVENOUS
  Filled 2013-07-04 (×3): qty 200

## 2013-07-04 MED ORDER — IOHEXOL 300 MG/ML  SOLN
125.0000 mL | Freq: Once | INTRAMUSCULAR | Status: AC | PRN
  Administered 2013-07-04: 115 mL via INTRAVENOUS

## 2013-07-04 MED ORDER — FENTANYL CITRATE 0.05 MG/ML IJ SOLN
INTRAMUSCULAR | Status: AC
  Filled 2013-07-04: qty 2

## 2013-07-04 NOTE — Procedures (Signed)
S/P lt common carotid arteriogram followed by IA infusion of 15 mg of TPA with partial recanalization of LT MCA trifurcation

## 2013-07-04 NOTE — Transfer of Care (Signed)
Immediate Anesthesia Transfer of Care Note  Patient: Heidi HanlonCynthia A Hyden  Procedure(s) Performed: * No procedures listed *  Patient Location: NICU  Anesthesia Type:General  Level of Consciousness: Patient remains intubated per anesthesia plan  Airway & Oxygen Therapy: Patient remains intubated per anesthesia plan and Patient placed on Ventilator (see vital sign flow sheet for setting)  Post-op Assessment: Report given to PACU RN and Post -op Vital signs reviewed and stable  Post vital signs: Reviewed and stable  Complications: No apparent anesthesia complications

## 2013-07-04 NOTE — Sedation Documentation (Signed)
Turn care over to anesthsia

## 2013-07-04 NOTE — Progress Notes (Signed)
Placed pt on SBT trial cpap/ps 5/5,40. Pt tol well. Pt in no distress.

## 2013-07-04 NOTE — Progress Notes (Signed)
50 mL Fentanyl from bag wis. Witnessed by Ulyess Blossomarcy Dennison-Harwood, RN

## 2013-07-04 NOTE — Progress Notes (Signed)
PULMONARY / CRITICAL CARE MEDICINE  Name: [name] MRN: [id number] DOB: 07/03/1930    ADMISSION DATE:  07/05/2013 CONSULTATION DATE: 06/23/2013  REFERRING MD :  EDP PRIMARY SERVICE:  PCCM  CHIEF COMPLAINT:  Chronic lung disease / perioperative management  BRIEF PATIENT DESCRIPTION:  80 yo with COPD admitted 3/20 with diverticular abscess s/p sigmoid colectomy, colostomy, cystoscopy and urethral stent placement.  SIGNIFICANT EVENTS / STUDIES:  3/25  OR >>>  Sigmoid colectomy, colostomy, cystoscopy and urethral stent placement.  LINES / TUBES: OETT 3/25 >>> OGT 3/25 >>> Foley 3/25 >>> R IJ CVL 3/25 >>> R PICC ??? >>> L R A-line 3/25 >>>  CULTURES: 3/22 Urine >>> YEAST  ANTIBIOTICS: Invanz 3/25 >>> Vancomycin 3/25 >>> Diflucan 3/25 >>>  INTERVAL HISTORY:  Weaning well overnight.  VITAL SIGNS: Temp:  [97.7 F (36.5 C)-98.5 F (36.9 C)] 97.7 F (36.5 C) (03/26 0804) Pulse Rate:  [44-134] 89 (03/26 0746) Resp:  [15-26] 16 (03/26 0615) BP: (105-181)/(44-99) 155/73 mmHg (03/26 0746) SpO2:  [98 %-100 %] 100 % (03/26 0746) Arterial Line BP: (104-219)/(48-103) 129/56 mmHg (03/26 0615) FiO2 (%):  [40 %-50 %] 40 % (03/26 0746) Weight:  [83.8 kg (184 lb 11.9 oz)] 83.8 kg (184 lb 11.9 oz) (03/26 0500) HEMODYNAMICS:   VENTILATOR SETTINGS: Vent Mode:  [-] PSV;CPAP FiO2 (%):  [40 %-50 %] 40 % Set Rate:  [16 bmp] 16 bmp Vt Set:  [420 mL] 420 mL PEEP:  [5 cmH20] 5 cmH20 Pressure Support:  [5 cmH20] 5 cmH20 Plateau Pressure:  [16 cmH20-21 cmH20] 16 cmH20 INTAKE / OUTPUT: Intake/Output     03/25 0701 - 03/26 0700 03/26 0701 - 03/27 0700   I.V. (mL/kg) 1267.5 (15.1)    Blood 1341    IV Piggyback     TPN 1020    Total Intake(mL/kg) 3628.5 (43.3)    Urine (mL/kg/hr) 865 (0.4)    Drains 117 (0.1)    Blood 200 (0.1)    Total Output 1182     Net +2446.5            PHYSICAL EXAMINATION: General:  No distress while on 5/5, good tidal volumes Neuro:  Awake, alert,  following commands HEENT:  PERRL, OETT / OGT Cardiovascular:  Regular, tachycardic Lungs:  Bilateral diminished air entry, rales Abdomen:  Surgical depressing dry, minimal JP drainage, bowel sounds absent Musculoskeletal:  Trace edema Skin:  Intact  LABS: CBC  Recent Labs Lab 06/30/2013 0500  06/26/2013 1519 06/29/2013 2000 02-24-2014 0340  WBC 9.9  --   --  18.0* 16.5*  HGB 7.7*  < > 10.5* 11.8* 11.4*  HCT 24.2*  < > 31.0* 35.1* 34.1*  PLT 349  --   --  320 306  < > = values in this interval not displayed. Coag's  Recent Labs Lab 06/29/13 0519 06/30/13 0500 07/01/13 0500  INR 2.89* 2.95* 1.05   BMET  Recent Labs Lab 06/27/2013 0500  06/21/2013 1519 06/25/2013 2000 02-24-2014 0340  NA 135*  < > 130* 130* 132*  K 4.4  < > 5.6* 5.1 5.4*  CL 87*  --   --  90* 91*  CO2 35*  --   --  30 30  BUN 57*  --   --  61* 64*  CREATININE 1.15*  --   --  1.21* 1.21*  GLUCOSE 132*  --  291* 274* 251*  < > = values in this interval not displayed. Electrolytes  Recent Labs Lab 07/01/13 0500 07/02/13  9147 06/11/2013 0500 07/05/2013 2000 06/18/2013 0340  CALCIUM 9.1 9.2 9.3 8.5 8.4  MG 2.0 1.9  --   --  2.0  PHOS 3.8 4.0  --   --  5.5*   Sepsis Markers No results found for this basename: LATICACIDVEN, PROCALCITON, O2SATVEN,  in the last 168 hours ABG  Recent Labs Lab 06/12/2013 1353 07/02/2013 1649 07/09/2013 0512  PHART 7.500* 7.416 7.352  PCO2ART 42.9 51.5* 53.0*  PO2ART 156.0* 137.0* 118.0*   Liver Enzymes  Recent Labs Lab 07/01/13 0500 07/02/2013 0500 06/30/2013 0340  AST 9 17 21   ALT 9 9 14   ALKPHOS 76 89 102  BILITOT 0.3 0.3 0.2*  ALBUMIN 2.1* 1.8* 1.8*   Cardiac Enzymes No results found for this basename: TROPONINI, PROBNP,  in the last 168 hours Glucose  Recent Labs Lab 06/12/2013 0806 06/21/2013 1648 06/13/2013 1943 06/27/2013 2318 06/28/2013 0323 06/11/2013 0802  GLUCAP 124* 292* 305* 232* 233* 263*   IMAGING: Dg Chest Port 1 View  07/03/2013   CLINICAL DATA:  Sigmoid  colectomy  EXAM: PORTABLE CHEST - 1 VIEW  COMPARISON:  DG CHEST 1V PORT dated 06/19/2013  FINDINGS: Tubular device is stable. Right pleural effusion worse. Left pleural effusion and basilar opacity stable. No pneumothorax. Vascular congestion and interstitial prominence are stable. Cardiomegaly.  IMPRESSION: Bilateral pleural effusions. It is stable on the left and increased on the right.   Electronically Signed   By: Maryclare Bean M.D.   On: 06/14/2013 08:16   Dg Chest Port 1 View  06/15/2013   CLINICAL DATA:  Three day history of respiratory failure; history of COPD and CHF and diabetes  EXAM: PORTABLE CHEST - 1 VIEW  COMPARISON:  DG CHEST 1V PORT dated 07/02/2013  FINDINGS: The patient is rotated on today's study. The lungs are adequately inflated. The interstitial markings remain increased bilaterally. Parenchymal consolidation at the left lung base is present and stable. The left hemidiaphragm is nearly totally obscured. The cardiac silhouette is mildly enlarged. The pulmonary vascularity is indistinct but not clearly engorged.  The patient has undergone interval intubation of the trachea. The endotracheal tube tip lies 5.2 cm above the crotch of the carina. The esophagogastric tube tip and proximal port project below the left hemidiaphragm. A PICC line via the right upper extremity has its tip in the proximal SVC. A right internal jugular venous catheter tip lies at approximately the same level.  IMPRESSION: 1. The lungs are adequately inflated and exhibit increased interstitial markings which are not new. Confluent density at the left lung base is consistent with atelectasis or pneumonia. There may be a small amount of pleural fluid on the left as well. 2. The support tubes and lines appear to be in appropriate position.   Electronically Signed   By: David  [place]   On: 07/09/2013 17:05   ASSESSMENT / PLAN:  PULMONARY A:   Acute respirator failure Tolerating SBT well COPD without exacerbation P:    Extubate Supplemental oxygen PRN Pulmonary hygiene / IS / Flutter valve DuoNeb / Pulmicort scheduled, Albuterol PRN  CARDIOVASCULAR A:  AF HTN Tachycardia Chronic diastolic heart failure P:  Increase Metoprolol 5 to q4h Hold Zebeta as NPO   RENAL A:   CKD Bilateral urethral stents placement Hematuria, improving P:   Trend BMP Hold Torsemide Per Urology Lasix 60 x 1 before extubation  GASTROINTESTINAL A:   Diverticulitis with abscess / fistula s/o sigmoid colectomy, colostomy Nutrition GI Px P:   NPO TNA Per CCS  Pepcid  HEMATOLOGIC A:   Therapeutic anticoagulation for EF prior to surgery VTE Px is not indicated P:  Trend CBC SCD Hold Coumadin Heparin gtt started by CCS  INFECTIOUS A:   Intraabdominal abscess P:   Cx / abx as above  ENDOCRINE  A:   DM Hyperglycemia Hypothyroidism Suspected adrenal insufficiency - chronic steroids P:   D/c SSI Start Insulin gtt Synthroid, convert to IV Hydrocortisone  NEUROLOGIC A:   Post op pain Vent synchrony P:   D/c Fentanyl gtt / boluses Start Morphine PRN  I have personally obtained history, examined patient, evaluated and interpreted laboratory and imaging results, reviewed medical records, formulated assessment / plan and placed orders.  CRITICAL CARE:  The patient is critically ill with multiple organ systems failure and requires high complexity decision making for assessment and support, frequent evaluation and titration of therapies, application of advanced monitoring technologies and extensive interpretation of multiple databases. Critical Care Time devoted to patient care services described in this note is 35 minutes.   Lonia Farber, MD Pulmonary and Critical Care Medicine [facility] Pager: [phone]  06/21/2013, 8:41 AM

## 2013-07-04 NOTE — Progress Notes (Signed)
PARENTERAL NUTRITION CONSULT NOTE - FOLLOW UP  Pharmacy Consult for TPN Indication: colocutaneous fistula  Allergies  Allergen Reactions  . Sulfonamide Derivatives Other (See Comments)    Childhood reaction    Patient Measurements: Height: 5\' 3"  (160 cm) Weight: 184 lb 11.9 oz (83.8 kg) IBW/kg (Calculated) : 52.4 Adjusted Body Weight: 65kg  Vital Signs: Temp: 97.7 F (36.5 C) (03/26 0804) Temp src: Oral (03/26 0804) BP: 140/71 mmHg (03/26 0900) Pulse Rate: 143 (03/26 0900) Intake/Output from previous day: 03/25 0701 - 03/26 0700 In: 3628.5 [I.V.:1267.5; Blood:1341; TPN:1020] Out: 1182 [Urine:865; Drains:117; Blood:200] Intake/Output from this shift: Total I/O In: 13.5 [I.V.:13.5] Out: -   Labs:  Recent Labs  07/02/2013 0500  06/12/2013 1519 06/13/2013 2000 07/05/2013 0340  WBC 9.9  --   --  18.0* 16.5*  HGB 7.7*  < > 10.5* 11.8* 11.4*  HCT 24.2*  < > 31.0* 35.1* 34.1*  PLT 349  --   --  320 306  < > = values in this interval not displayed.   Recent Labs  07/02/13 0455 06/16/2013 0500  06/25/2013 1519 06/18/2013 2000 06/11/2013 0340  NA 134* 135*  < > 130* 130* 132*  K 4.3 4.4  < > 5.6* 5.1 5.4*  CL 87* 87*  --   --  90* 91*  CO2 35* 35*  --   --  30 30  GLUCOSE 120* 132*  --  291* 274* 251*  BUN 46* 57*  --   --  61* 64*  CREATININE 1.13* 1.15*  --   --  1.21* 1.21*  CALCIUM 9.2 9.3  --   --  8.5 8.4  MG 1.9  --   --   --   --  2.0  PHOS 4.0  --   --   --   --  5.5*  PROT  --  6.6  --   --   --  6.6  ALBUMIN  --  1.8*  --   --   --  1.8*  AST  --  17  --   --   --  21  ALT  --  9  --   --   --  14  ALKPHOS  --  89  --   --   --  102  BILITOT  --  0.3  --   --   --  0.2*  TRIG 313*  --   --   --   --   --   < > = values in this interval not displayed. Estimated Creatinine Clearance: 37.4 ml/min (by C-G formula based on Cr of 1.21).    Recent Labs  06/19/2013 2318 06/24/2013 0323 07/05/2013 0802  GLUCAP 232* 233* 263*   Insulin Requirements in the past 24  hours:  37 units SSI, now on insulin drip  Current Nutrition:  Clinimix E 5/15 at 8375mL/hr + 20% lipid emulsion at 7410mL/hr on MWF only- this will provide an average of 90 gm protein and 1484 kCal daily  Nutritional Goals:  1600-1800 kCal, 80-95 grams of protein per day per RD recs on 3/21  Admit:  83 YOF recently hospitaliazed for PNA and flu and developed diverticulitis with abscess which required drain placement. This week her drain was attempted to be upsized d/t stool in abscess cavity and she continued to have worsening pain and weakness.   GI: has been NPO except for medications; now s/p sigmoid colectomy/colostomy/I&D of left flank drain site on 3/25.  PO Pepcid.  Prealbumin was 13.2 on 3/18- this admission it decreased to 10.5.  Endo: hx hypothyroid, TSH 06/22/2013 1.9, continues on Synthroid 50; hx DM- 06/22/2013 A1C 7%. CBGs markedly elevated post-operatively; note prednisone was changed to stress-dose hydrocortisone IV and she is now on an insulin drip.  Lytes: Na 132, K 5.4, Phos 5.5, Mag 2 - will require removal of electrolytes from TPN today  Renal: hx CKD; SCr stable but UOP is low   Pulm: 3on vent post-op; budesonide, duonebs  Cards: VSS, bisoprolol changed to IV Metoprolol  Hepatobil: LFTs wnl except albumin is low at 1.8. Triglycerides remain elevated at 313.  Lipid provisions were adjusted 3/23.  Neuro:  A&O  ID: was on Flagyl and tigecycline at [facility], changed to ertapenem and vancomycin here- D#7 for both of those. WBC 10, afebrile.  Her Vancomycin regimen was adjusted 3/24 for a slightly elevated trough.  Anticoagulation: Afib - heparin infusion for bridging while Coumadin is on hold, was off 3/25 for surgery and was restarted 3/26 AM  Best Practices: heparin infusion for atrial fibrillation while Coumadin is on hold.  TPN Access: R PICC  TPN day#: 9 (started 3/17 in [facility])  Plan:  Continue Clinimix 5/15 at 66mL/hr but change to an electrolyte-free formula.  Continue 20% lipid emulsion at 13mL/hr on MWF only as triglycerides remain elevated.  This will provide an average of 90 gm protein and 1484 kCal per day. Will change Pepcid to be provided in TPN bag. Daily IV Multivitamin in TPN; trace elements on MWF only due to national shortage Continue MIVF as 1/2NS at [facility]  Continue with 20 units of insulin in TPN bag. Follow CBGs and insulin requirements on insulin infusion CMET, Mag, Phos tomorrow morning  Estella Husk, Pharm.D., BCPS, AAHIVP Clinical Pharmacist Phone: [phone] or [phone] 07/03/2013, 10:07 AM

## 2013-07-04 NOTE — Evaluation (Signed)
Physical Therapy Evaluation Patient Details Name: [name] MRN: [id number] DOB: [birth year] Today's Date: 06/09/2013   History of Present Illness  Patient is an 80 year old female who was recently the hospital with pneumonia and the flu who subsequently developed diverticulitis with diverticular abscess.  A drain was placed. There was stool leaking from the abscess cavity. She has had her drain attempted to be upsized and continued to have worsening pain this week. She has continued to become weaker and unable to get out of bed. She had significant pain and cellulitis of the drain insertion site.  Denies fevers and chills. She has also been on antibiotics throughout this time. She's been on TNA and has been n.p.o. other than her pills. 2013/07/05 SIGMOID COLECTOMY; HARTMANN'S PROCEDURE WITH COLOSTOMY; I&D LEFT FLANK DRAIN SITE and Bilateral ureteral stent placement.  Clinical Impression  Pt admitted with/for above state problems.  As of end of evaluation have called a code stroke.  Pt currently limited functionally due to the problems listed. ( See problems list.)   Pt will benefit from PT to maximize function and safety in order to get ready for next venue listed below.      Follow Up Recommendations CIR    Equipment Recommendations  Other (comment) (TBA at next venue)    Recommendations for Other Services Rehab consult     Precautions / Restrictions Precautions Precautions: Fall Precaution Comments: L inguinal penrose drain, left lower abdominal colostomy, midline abdominal incision Restrictions Weight Bearing Restrictions: No      Mobility  Bed Mobility Overal bed mobility: Needs Assistance;+2 for physical assistance Bed Mobility: Rolling;Sidelying to Sit;Sit to Supine Rolling: Total assist;+2 for physical assistance Sidelying to sit: Total assist;+2 for physical assistance   Sit to supine: Total assist;+2 for physical assistance      Transfers                  General transfer comment: Unable at this time due to lethargy  Ambulation/Gait                Stairs            Wheelchair Mobility    Modified Rankin (Stroke Patients Only)       Balance Overall balance assessment: Needs assistance Sitting-balance support: Feet supported;Single extremity supported Sitting balance-Leahy Scale: Zero Sitting balance - Comments: A times she was total A and at other times min A (at EOB). While seated EOB she would occassionally pull with her LUE, would grab onto anything that came in contact with her hand, flexed her LLE back under her more (when asked to kick her leg out)                             Pertinent Vitals/Pain VSS    Home Living Family/patient expects to be discharged to:: Private residence Living Arrangements: Children Available Help at Discharge: Family Type of Home: House Home Access: Stairs to enter Entrance Stairs-Rails: Can reach both Entrance Stairs-Number of Steps: 6 Home Layout: Two level Home Equipment: Environmental consultant - 2 wheels Additional Comments: Unknown, pt on OETT and not trying to communicate and no family available    Prior Function Level of Independence: Needs assistance   Gait / Transfers Assistance Needed: RW inside and WC outside  ADL's / Homemaking Assistance Needed: ADLs 50% assist for bathing and dressing, daughter-in-law bathes pt in evening  Comments: Unknown, pt on OETT and not trying to communicate and  no family available     Hand Dominance        Extremity/Trunk Assessment   Upper Extremity Assessment: Defer to OT evaluation RUE Deficits / Details: No active movement noted, only grimace x1 to nail bed pressure; PROM WNL     LUE Deficits / Details: with noxious stimulation she moves arm, grabs onto anything put in her hand, did loosen grip on my hand x 1 when asked her to   Lower Extremity Assessment: RLE deficits/detail;LLE deficits/detail RLE Deficits / Details: delayed  minimal withdrawal from noxious stimuli LLE Deficits / Details: difficult to assess fully due to arousal, but instantly withraws to noxious stim, resists movement in both flexion and extension     Communication   Communication: Other (comment) (orally intubated)  Cognition Arousal/Alertness: Lethargic (fatigue from vent wean or ? cva) Behavior During Therapy: WFL for tasks assessed/performed Overall Cognitive Status: Difficult to assess                      General Comments      Exercises General Exercises - Lower Extremity Heel Slides: AAROM;PROM;Both;10 reps;Supine      Assessment/Plan    PT Assessment Patient needs continued PT services  PT Diagnosis Generalized weakness (possible CVA)   PT Problem List Decreased strength;Decreased activity tolerance;Decreased balance;Decreased mobility;Decreased coordination;Decreased knowledge of use of DME;Cardiopulmonary status limiting activity  PT Treatment Interventions DME instruction;Functional mobility training;Therapeutic activities;Balance training;Neuromuscular re-education;Patient/family education   PT Goals (Current goals can be found in the Care Plan section) Acute Rehab PT Goals Patient Stated Goal: pt unable to state and no family available Time For Goal Achievement: 07/18/13 Potential to Achieve Goals: Fair    Frequency Min 3X/week   Barriers to discharge        End of Session Equipment Utilized During Treatment: Oxygen Activity Tolerance: Patient tolerated treatment well Patient left: in bed;with call bell/phone within reach         Time: 1429-1503 PT Time Calculation (min): 34 min   Charges:   PT Evaluation $Initial PT Evaluation Tier I: 1 Procedure PT Treatments $Therapeutic Activity: 8-22 mins   PT G Codes:          [name], Eliseo GumKenneth V 06/11/2013, 5:16 PM 06/23/2013  [name], PT [phone]  (pager)

## 2013-07-04 NOTE — Progress Notes (Signed)
NUTRITION FOLLOW UP  Intervention:    TPN per pharmacy  RD to follow for nutrition care plan  Nutrition Dx:   Inadequate oral intake related to inability to eat as evidenced by NPO status, ongoing  Goal:   Patient to meet >/= 90% of estimated nutrition needs, met  Monitor:   TPN prescription, respiratory status, weight, labs, I/O's  Assessment:   Patient is an 80 year old female who was recently in the hospital with pneumonia and the flu who subsequently developed diverticulitis with diverticular abscess. A drain was placed. There was stool leaking from the abscess cavity. She has continued to become weaker and unable to get out of bed. She's been on TPN and has been n.p.o. other than her pills. She was transferred in from [facility].  From Nutrition note on 3/21:  At [facility], was on Clinimix 4.25/25 at 20m/hr with NO lipids. Weight was down to 177 lb at the beginning of last hospitalization (3/6). Weight now up to 185 lb, likely partially related to fluid status.  [place]  Patient transferred to 2S-Surgical ICU from 6N-Surgical post-op.  Patient is currently intubated on ventilator support MV: 5.9 L/min Temp (24hrs), Avg:98.1 F (36.7 C), Min:97.7 F (36.5 C), Max:98.8 F (37.1 C)   Patient with colocutaneous fistula.  Patient continues to receive TPN with Clinimix E 5/15 @ 75 ml/hr.  Lipids (20% IVFE @ 10 ml/hr) are provided 3 times weekly (MWF) due to elevated triglycerides.  Provides 1484 kcal and 90 grams protein daily (based on weekly average).  Meets 100% minimum estimated kcal and 90% minimum estimated protein needs.  Height: Ht Readings from Last 1 Encounters:  06/29/13 '5\' 3"'  (1.6 m)    Weight Status:   Wt Readings from Last 1 Encounters:  06/22/2013 184 lb 11.9 oz (83.8 kg)    Body mass index is 32.73 kg/(m^2).  Re-estimated needs:  Kcal:  1350-1500 Protein: 100-110 gm  Fluid: per MD  Skin: Stage II pressure ulcer on buttocks, colostomy  Diet Order: NPO   Intake/Output Summary (Last 24 hours) at 07/07/2013 1519 Last data filed at 06/24/2013 1300  Gross per 24 hour  Intake 3235.55 ml  Output   1742 ml  Net 1493.55 ml    Labs:   Recent Labs Lab 07/01/13 0500 07/02/13 0455 07/07/2013 0500  06/24/2013 1519 06/23/2013 2000 06/29/2013 0340  NA 138 134* 135*  < > 130* 130* 132*  K 4.4 4.3 4.4  < > 5.6* 5.1 5.4*  CL 93* 87* 87*  --   --  90* 91*  CO2 36* 35* 35*  --   --  30 30  BUN 41* 46* 57*  --   --  61* 64*  CREATININE 1.12* 1.13* 1.15*  --   --  1.21* 1.21*  CALCIUM 9.1 9.2 9.3  --   --  8.5 8.4  MG 2.0 1.9  --   --   --   --  2.0  PHOS 3.8 4.0  --   --   --   --  5.5*  GLUCOSE 184* 120* 132*  --  291* 274* 251*  < > = values in this interval not displayed.   Triglycerides  Date Value Ref Range Status  07/02/2013 313* <150 mg/dL Final    CBG (last 3)   Recent Labs  06/25/2013 2318 06/11/2013 0323 06/29/2013 0802  GLUCAP 232* 233* 263*  Scheduled Meds: . antiseptic oral rinse  15 mL Mouth Rinse QID  . budesonide  0.5 mg Nebulization BID  . chlorhexidine  15 mL Mouth Rinse BID  . ertapenem  1 g Intravenous Q24H  . fluconazole (DIFLUCAN) IV  200 mg Intravenous Q24H  . hydrocortisone sodium succinate  50 mg Intravenous Q6H  . ipratropium-albuterol  3 mL Nebulization Q4H  . levothyroxine  25 mcg Intravenous Daily  . metoprolol  5 mg Intravenous Q4H  . sodium chloride  10-40 mL Intracatheter Q12H  . vancomycin  500 mg Intravenous Q24H    Continuous Infusions: . sodium chloride 20 mL/hr (06/27/2013 1821)  . Marland KitchenTPN (CLINIMIX-E) Adult 75 mL/hr at 07/09/2013 1900   And  . fat emulsion 250 mL (06/10/2013 1900)  . heparin 1,350 Units/hr (06/17/2013 0830)  . insulin (NOVOLIN-R) infusion 7.7 Units/hr (06/14/2013 1439)  . TPN [facility]) Adult without lytes      Arthur Holms, RD, LDN Pager #: [phone] After-Hours  Pager #: [phone]

## 2013-07-04 NOTE — Progress Notes (Signed)
PT/OT in working with patient.  Was sitting patient on side of bed when they noticed that she wasn't moving right side.  They laid her back down and called me in to assess patient.  Patient currently not responding on right side, has no movement rt. Upper extremity as well as rt. Lower extremity.  Eyes checked and patient noted to be deviated upward with left gaze. Pupils equal and reactive to light-sluggishly.   Devon Lofters, RN in to see patient and assess as well and noted same assessment.  Code Stroked called.  Dr. Armida Sans in to see patient.  Patient taken down to x-ray for stat CT Head.  Dr. Grandville Silos and Dr. Joya Gaskins notified of above findings.  Dr. Grandville Silos met [place] in CT, and then called Daughter to make her aware of findings.  Patient then taken over to IR following CT scan.

## 2013-07-04 NOTE — Progress Notes (Signed)
ANTICOAGULATION CONSULT NOTE - Initial Consult  Pharmacy Consult for heparin Indication: atrial fibrillation  Allergies  Allergen Reactions  . Sulfonamide Derivatives Other (See Comments)    Childhood reaction    Patient Measurements: Height: 5\' 3"  (160 cm) Weight: 184 lb 11.9 oz (83.8 kg) IBW/kg (Calculated) : 52.4 Heparin Dosing Weight: 62.8  Vital Signs: Temp: 97.9 F (36.6 C) (03/26 0340) Temp src: Oral (03/26 0340) BP: 117/71 mmHg (03/26 0600) Pulse Rate: 71 (03/26 0615)  Labs:  Recent Labs  07/02/13 1500 06/21/2013 0500  06/27/2013 1519 06/30/2013 2000 06/15/2013 0340  HGB  --  7.7*  < > 10.5* 11.8* 11.4*  HCT  --  24.2*  < > 31.0* 35.1* 34.1*  PLT  --  349  --   --  320 306  HEPARINUNFRC 0.33 <0.10*  --   --   --  <0.10*  CREATININE  --  1.15*  --   --  1.21* 1.21*  < > = values in this interval not displayed.  Estimated Creatinine Clearance: 37.4 ml/min (by C-G formula based on Cr of 1.21).   Medical History: Past Medical History  Diagnosis Date  . Diabetes mellitus   . Obesity   . COPD (chronic obstructive pulmonary disease)     Moderate. PFTs (12/10): FVC 67%, FEV1 58%, ratio 60%, TLC 95%, RV 138%, DLCO 43% (moderate obstructive defect). She is on home oxygen that should be worn at all times.  . Diastolic CHF, chronic     Echo (3/12): EF 55-60%, mild LV hypertrophy, grade I diastolic dysfunction, mild left atrial enlargement, normal pulmonary artery pressure  . CKD (chronic kidney disease)     Cr 2.4 when last checked. ANCA +, pauci-immune glomerulnephritis followed by Dr. Arrie Aranoladonato  . PNA (pneumonia)     h/o with ARDS in 2005; [facility]  . Diverticulum     Jejunal  . OSA (obstructive sleep apnea)     possible. When pt was sedated for TEE in May 2010, she did develop some upper airway obstruction  . Anemia     of chronic disease  . Hypothyroidism   . Gout   . Atrial fibrillation 07/2008   Failed cardioversion with TEE guidance in May 2010. Pt went back to NSR by 11/2008 with amiodarne  . Hypertension   . GERD (gastroesophageal reflux disease)   . Peripheral vascular disease   . Pneumonia   . Chronic kidney disease   . Respiratory failure, chronic 07/01/2011  . Chronic gout due to renal impairment 07/01/2011    Medications:  Prescriptions prior to admission  Medication Sig Dispense Refill  . Aclidinium Bromide (TUDORZA PRESSAIR) 400 MCG/ACT AEPB Inhale 1 puff into the lungs 2 (two) times daily.      Marland Kitchen. albuterol (PROVENTIL) (2.5 MG/3ML) 0.083% nebulizer solution Take 3 mLs (2.5 mg total) by nebulization 2 (two) times daily. Dx: 492.8 COPD with Emphysema  270 mL  5  . Amino Acids-Protein Hydrolys (FEEDING SUPPLEMENT, PRO-STAT SUGAR FREE 64,) LIQD Take 30 mLs by mouth 2 (two) times daily.      . bisoprolol (ZEBETA) 10 MG tablet Take 5 mg by mouth daily.      . budesonide (PULMICORT) 0.5 MG/2ML nebulizer solution Take 0.5 mg by nebulization 2 (two) times daily.      . famotidine (PEPCID) 20 MG tablet Take 20 mg by mouth daily.      Marland Kitchen. guaiFENesin (MUCINEX) 600 MG 12 hr tablet Take 600 mg  by mouth 2 (two) times daily.      . insulin aspart (NOVOLOG) 100 UNIT/ML injection Inject into the skin every 6 (six) hours. Sliding scale      . levothyroxine (SYNTHROID, LEVOTHROID) 50 MCG tablet Take 50 mcg by mouth daily.      . metroNIDAZOLE (FLAGYL) 5-0.79 MG/ML-% IVPB Inject 500 mg into the vein every 6 (six) hours.      . Multiple Vitamins-Minerals (MULTIVITAMINS THER. W/MINERALS) TABS tablet Take 1 tablet by mouth daily.      Marland Kitchen oxyCODONE (OXY IR/ROXICODONE) 5 MG immediate release tablet Take 5 mg by mouth every 12 (twelve) hours.      . Probiotic Product (RISA-BID PROBIOTIC PO) Take 1 capsule by mouth 2 (two) times daily.      . tigecycline (TYGACIL) 50 MG injection Inject 50 mg into the vein 2 (two) times daily.      Marland Kitchen torsemide (DEMADEX) 20 MG tablet Take 80 mg by mouth daily.      .  barrier cream (NON-SPECIFIED) CREA Apply 1 application topically 3 (three) times daily as needed (place around drain area to keep it dry and clean).        Assessment: 80 yo lady to resume heparin for afib.  She was previously therapeutic on heparin 1350 units/hr Goal of Therapy:  Heparin level 0.3-0.7 units/ml Monitor platelets by anticoagulation protocol: Yes   Plan:  Resume heparin at 1350 units/hr with no bolus as per MD Check heparin level 8 hours after start. Daily HL and CBC while on heparin. Monitor for bleeding.  [name] 08-01-2013,7:38 AM

## 2013-07-04 NOTE — Consult Note (Addendum)
Referring Physician: CCS    Chief Complaint: Code stroke  HPI:                                                                                                                                         [name] is an 80 y.o. female who underwent a  SIGMOID COLECTOMY; HARTMANN'S PROCEDURE WITH COLOSTOMY; on 06/23/2013.  She has known Afib and was taken of her coumadin prior to surgery. patient had been restarted on Heparin today and was doing well.  She was last seen normal at 1200.  At 2:30 PT noted she was not moving her right side and called the nurse. On arrival patient is intubated, following no commands, has a slight left gaze deviation, shows a right hemiplegia and moving left arm and leg antigravity.  Patient is not a tPA candidate but was in the window for IA therapy.  Pateint was taken to CT which showed no intracranial bleed.  IR was called and patient was brought straight to IR for cerebral angiogram.   Date last known well: Date: 07/03/2013 Time last known well: Time: 12:00 tPA Given: No: recent surgery, IV heparin. NIHSS: 20  Past Medical History  Diagnosis Date  . Diabetes mellitus   . Obesity   . COPD (chronic obstructive pulmonary disease)     Moderate. PFTs (12/10): FVC 67%, FEV1 58%, ratio 60%, TLC 95%, RV 138%, DLCO 43% (moderate obstructive defect). She is on home oxygen that should be worn at all times.  . Diastolic CHF, chronic     Echo (3/12): EF 55-60%, mild LV hypertrophy, grade I diastolic dysfunction, mild left atrial enlargement, normal pulmonary artery pressure  . CKD (chronic kidney disease)     Cr 2.4 when last checked. 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 PNA (pneumonia)     h/o with ARDS in 2005; MRSA PNA with prolonged hospitalization in [facility] 11/11  . Diverticulum     Jejunal  . OSA (obstructive sleep apnea)     possible. When pt was sedated for TEE in May 2010, she did develop some upper airway obstruction  .  Anemia     of chronic disease  . Hypothyroidism   . Gout   . Atrial fibrillation 07/2008    Failed cardioversion with TEE guidance in May 2010. Pt went back to NSR by 11/2008 with amiodarne  . Hypertension   . GERD (gastroesophageal reflux disease)   . Peripheral vascular disease   . Pneumonia   . Chronic kidney disease   . Respiratory failure, chronic 07/01/2011  . Chronic gout due to renal impairment 07/01/2011    Past Surgical History  Procedure Laterality Date  . Tracheostomy      ARDS/ ICU admission, trach 2005  . Microperforation  08/2008    Of jejunem, ICU admission    Family History  Problem Relation Age  of Onset  . Emphysema Mother     and asthma  . Asthma Mother   . Heart disease Neg Hx     premature   Social History:  reports that she quit smoking about 37 years ago. Her smoking use included Cigarettes. She has a 23 pack-year smoking history. She has never used smokeless tobacco. She reports that she drinks alcohol. She reports that she does not use illicit drugs.  Allergies:  Allergies  Allergen Reactions  . Sulfonamide Derivatives Other (See Comments)    Childhood reaction    Medications:                                                                                                                           Scheduled: . antiseptic oral rinse  15 mL Mouth Rinse QID  . budesonide  0.5 mg Nebulization BID  . chlorhexidine  15 mL Mouth Rinse BID  . ertapenem  1 g Intravenous Q24H  . fluconazole (DIFLUCAN) IV  200 mg Intravenous Q24H  . hydrocortisone sodium succinate  50 mg Intravenous Q6H  . ipratropium-albuterol  3 mL Nebulization Q4H  . levothyroxine  25 mcg Intravenous Daily  . metoprolol  5 mg Intravenous Q4H  . sodium chloride  10-40 mL Intracatheter Q12H  . vancomycin  500 mg Intravenous Q24H   Continuous: . sodium chloride 20 mL/hr (07/09/2013 1821)  . Marland KitchenTPN (CLINIMIX-E) Adult 75 mL/hr at 06/30/2013 1900   And  . fat emulsion 250 mL (06/30/2013  1900)  . heparin 1,350 Units/hr (06/26/2013 0830)  . insulin (NOVOLIN-R) infusion 7.7 Units/hr (06/16/2013 1439)  . TPN (CLINIMIX) Adult without lytes      ROS:                                                                                                                                       History obtained from unobtainable from patient due to mental status  Neurologic Examination:  Blood pressure 110/48, pulse 75, temperature 98.8 F (37.1 C), temperature source Oral, resp. rate 12, height 5\' 3"  (1.6 m), weight 83.8 kg (184 lb 11.9 oz), SpO2 99.00%.   Mental Status: Intubated, not following commands.   Cranial Nerves: II: right hemianopsia, pupils equal, round, reactive to light and accommodation III,IV, VI: ptosis not present, slight left gaze preference and does not cross midline with doll's maneuver.  V,VII: face symmetric but intubated, no wince to bilateral facial noxious stimuli VIII: unable to asses IX,X: gag reflex intact XI: left shoulder shrug intact XII: unable to assess  Motor: Shows a dense right hemiplegia and moving left arm and leg antigravity.  Sensory: Pinprick and light touch intact throughout, bilaterally Deep Tendon Reflexes:  Right: Upper Extremity   Left: Upper extremity   biceps (C-5 to C-6) 2/4   biceps (C-5 to C-6) 2/4 tricep (C7) 2/4    triceps (C7) 2/4 Brachioradialis (C6) 2/4  Brachioradialis (C6) 2/4  Lower Extremity Lower Extremity  quadriceps (L-2 to L-4) 2/4   quadriceps (L-2 to L-4) 2/4 Achilles (S1) 0/4   Achilles (S1) 0/4  Plantars: Right: upgoing   Left: downgoing Cerebellar: Unable to assess Gait: unable to assess CV: pulses palpable throughout    Lab Results: Basic Metabolic Panel:  Recent Labs Lab 06/29/13 0519 06/30/13 0500 07/01/13 0500 07/02/13 0455 07/05/2013 0500 06/26/2013 1353 06/10/2013 1519 06/14/2013 2000  06/23/2013 0340  NA 142 137 138 134* 135* 131* 130* 130* 132*  K 3.5* 4.6 4.4 4.3 4.4 4.7 5.6* 5.1 5.4*  CL 99 96 93* 87* 87*  --   --  90* 91*  CO2 32 31 36* 35* 35*  --   --  30 30  GLUCOSE 250* 152* 184* 120* 132*  --  291* 274* 251*  BUN 39* 37* 41* 46* 57*  --   --  61* 64*  CREATININE 1.33* 1.21* 1.12* 1.13* 1.15*  --   --  1.21* 1.21*  CALCIUM 8.4 8.6 9.1 9.2 9.3  --   --  8.5 8.4  MG 1.7 1.6 2.0 1.9  --   --   --   --  2.0  PHOS 2.7 2.7 3.8 4.0  --   --   --   --  5.5*    Liver Function Tests:  Recent Labs Lab 06/29/13 0519 07/01/13 0500 07/05/2013 0500 06/30/2013 0340  AST 9 9 17 21   ALT 9 9 9 14   ALKPHOS 81 76 89 102  BILITOT 0.3 0.3 0.3 0.2*  PROT 6.0 6.6 6.6 6.6  ALBUMIN 1.7* 2.1* 1.8* 1.8*   No results found for this basename: LIPASE, AMYLASE,  in the last 168 hours No results found for this basename: AMMONIA,  in the last 168 hours  CBC:  Recent Labs Lab 06/29/13 0519 07/01/13 0500 07/02/13 0455 06/09/2013 0500 06/20/2013 1353 06/17/2013 1519 07/05/2013 2000 06/20/2013 0340  WBC 9.7 8.9 10.0 9.9  --   --  18.0* 16.5*  NEUTROABS 6.9 6.2  --   --   --   --  16.8*  --   HGB 8.0* 7.8* 7.9* 7.7* 8.2* 10.5* 11.8* 11.4*  HCT 24.9* 24.3* 25.0* 24.2* 24.0* 31.0* 35.1* 34.1*  MCV 92.9 93.1 93.3 94.5  --   --  87.8 87.7  PLT 281 305 337 349  --   --  320 306    Cardiac Enzymes: No results found for this basename: CKTOTAL, CKMB, CKMBINDEX, TROPONINI,  in the last 168 hours  Lipid  Panel:  Recent Labs Lab 06/29/13 0519 07/01/13 0500 07/02/13 0455  TRIG 284* 336* 313*    CBG:  Recent Labs Lab 06/19/2013 1648 06/09/2013 1943 06/15/2013 2318 06/18/2013 0323 06/17/2013 0802  GLUCAP 292* 305* 232* 233* 263*    Microbiology: Results for orders placed during the hospital encounter of 11/19/2013  URINE CULTURE     Status: None   Collection Time    06/30/13 10:58 AM      Result Value Ref Range Status   Specimen Description URINE, CATHETERIZED   Final   Special Requests  NONE   Final   Culture  Setup Time     Final   Value: 06/30/2013 19:44     [facility]formed at [facility]   Colony Count     Final   Value: 40,000 COLONIES/ML     [facility]formed at [facility]   Culture     Final   Value: YEAST     [facility]formed at [facility]   Report Status 07/01/2013 FINAL   Final    Coagulation Studies: No results found for this basename: LABPROT, INR,  in the last 72 hours  Imaging: Dg Chest Port 1 View  06/20/2013   CLINICAL DATA:  Sigmoid colectomy  EXAM: PORTABLE CHEST - 1 VIEW  COMPARISON:  DG CHEST 1V PORT dated 07/07/2013  FINDINGS: Tubular device is stable. Right pleural effusion worse. Left pleural effusion and basilar opacity stable. No pneumothorax. Vascular congestion and interstitial prominence are stable. Cardiomegaly.  IMPRESSION: Bilateral pleural effusions. It is stable on the left and increased on the right.   Electronically Signed   By: Maryclare BeanArt  Hoss M.D.   On: 07/07/2013 08:16   Dg Chest Port 1 View  06/27/2013   CLINICAL DATA:  Three day history of respiratory failure; history of COPD and CHF and diabetes  EXAM: PORTABLE CHEST - 1 VIEW  COMPARISON:  DG CHEST 1V PORT dated 07/02/2013  FINDINGS: The patient is rotated on today's study. The lungs are adequately inflated. The interstitial markings remain increased bilaterally. Parenchymal consolidation at the left lung base is present and stable. The left hemidiaphragm is nearly totally obscured. The cardiac silhouette is mildly enlarged. The pulmonary vascularity is indistinct but not clearly engorged.  The patient has undergone interval intubation of the trachea. The endotracheal tube tip lies 5.2 cm above the crotch of the carina. The esophagogastric tube tip and proximal port project below the left hemidiaphragm. A PICC line via the right upper extremity has its tip in the proximal SVC. A right internal jugular venous catheter tip lies at approximately the same level.  IMPRESSION: 1. The lungs are  adequately inflated and exhibit increased interstitial markings which are not new. Confluent density at the left lung base is consistent with atelectasis or pneumonia. There may be a small amount of pleural fluid on the left as well. 2. The support tubes and lines appear to be in appropriate position.   Electronically Signed   By: David  [place]   On: 06/26/2013 17:05   Assessment: 80 y.o. female with acute onset left hemispheric syndrome, concerning for a left MCA distribution infarct most likely cardioembolism in the context of atrial fibrillation. Patient has a dense right hemiplegia, seems to be globally aphasic,  but is not a candidate for IV thrombolysis due to recent major surgery and use of IV heparin. Get stat CT brain. Discussed with interventional radiology and agreed upon pursuing cerebral angio searching for a proximal left MCA clot  amenable to endovascular intervention.    Stroke Risk Factors - atrial fibrillation, diabetes mellitus and hypertension   Ermalene Postin [facility] [phone]  06/19/2013, 3:37 PM

## 2013-07-04 NOTE — Progress Notes (Addendum)
1 Day Post-Op  Subjective: On vent  Objective: Vital signs in last 24 hours: Temp:  [97.8 F (36.6 C)-98.5 F (36.9 C)] 97.9 F (36.6 C) (03/26 0340) Pulse Rate:  [44-134] 71 (03/26 0615) Resp:  [15-26] 16 (03/26 0615) BP: (105-181)/(44-99) 117/71 mmHg (03/26 0600) SpO2:  [96 %-100 %] 99 % (03/26 0615) Arterial Line BP: (104-219)/(48-103) 129/56 mmHg (03/26 0615) FiO2 (%):  [40 %-50 %] 40 % (03/26 0600) Weight:  [184 lb 11.9 oz (83.8 kg)] 184 lb 11.9 oz (83.8 kg) (03/26 0500) Last BM Date: 2013/07/20  Intake/Output from previous day: 03/25 0701 - 03/26 0700 In: 3628.5 [I.V.:1267.5; Blood:1341; TPN:1020] Out: 1182 [Urine:865; Drains:117; Blood:200] Intake/Output this shift:    General appearance: no distress Resp: rhonchi R>L Cardio: irregularly irregular rhythm GI: soft, colostomy pink with min output, midline wound clean, quiet Extremities: erythema R 4th toe L lateral penrose and minimal SS output  Lab Results:   Recent Labs  06/12/2013 2000 2013/07/20 0340  WBC 18.0* 16.5*  HGB 11.8* 11.4*  HCT 35.1* 34.1*  PLT 320 306   BMET  Recent Labs  06/18/2013 2000 2013-07-20 0340  NA 130* 132*  K 5.1 5.4*  CL 90* 91*  CO2 30 30  GLUCOSE 274* 251*  BUN 61* 64*  CREATININE 1.21* 1.21*  CALCIUM 8.5 8.4   PT/INR No results found for this basename: LABPROT, INR,  in the last 72 hours ABG  Recent Labs  06/29/2013 1649 2013/07/20 0512  PHART 7.416 7.352  HCO3 33.2* 28.7*    Studies/Results: Dg Chest Port 1 View  07/07/2013   CLINICAL DATA:  Three day history of respiratory failure; history of COPD and CHF and diabetes  EXAM: PORTABLE CHEST - 1 VIEW  COMPARISON:  DG CHEST 1V PORT dated 07/02/2013  FINDINGS: The patient is rotated on today's study. The lungs are adequately inflated. The interstitial markings remain increased bilaterally. Parenchymal consolidation at the left lung base is present and stable. The left hemidiaphragm is nearly totally obscured. The cardiac  silhouette is mildly enlarged. The pulmonary vascularity is indistinct but not clearly engorged.  The patient has undergone interval intubation of the trachea. The endotracheal tube tip lies 5.2 cm above the crotch of the carina. The esophagogastric tube tip and proximal port project below the left hemidiaphragm. A PICC line via the right upper extremity has its tip in the proximal SVC. A right internal jugular venous catheter tip lies at approximately the same level.  IMPRESSION: 1. The lungs are adequately inflated and exhibit increased interstitial markings which are not new. Confluent density at the left lung base is consistent with atelectasis or pneumonia. There may be a small amount of pleural fluid on the left as well. 2. The support tubes and lines appear to be in appropriate position.   Electronically Signed   By: David  [place]   On: 06/13/2013 17:05    Anti-infectives: Anti-infectives   Start     Dose/Rate Route Frequency Ordered Stop   06/19/2013 0600  vancomycin (VANCOCIN) 500 mg in sodium chloride 0.9 % 100 mL IVPB     500 mg 100 mL/hr over 60 Minutes Intravenous Every 24 hours 07/02/13 0230     07/02/13 1200  fluconazole (DIFLUCAN) IVPB 200 mg     200 mg 100 mL/hr over 60 Minutes Intravenous Every 24 hours 07/02/13 1128     07/05/2013 2300  ertapenem (INVANZ) 1 g in sodium chloride 0.9 % 50 mL IVPB     1 g 100  mL/hr over 30 Minutes Intravenous Every 24 hours 29-Oct-2013 2142     29-Oct-2013 2200  vancomycin (VANCOCIN) IVPB 750 mg/150 ml premix  Status:  Discontinued     750 mg 150 mL/hr over 60 Minutes Intravenous Every 24 hours 29-Oct-2013 2104 07/02/13 0230      Assessment/Plan: S/P sigmoid colectomy/colostomy/I&D L flank drain site POD#1 Await return of bowel function ID - Vanc/Invanz/Diflucan VDRF/COPD - appreciate CCM management, anticipate weaning Afib - start heparin drip with no bolus, Lopressor IV PCM - TNA Likely gout L 4th toe - treat when can take PO  LOS: 6 days     Maisey Deandrade E 07/01/2013

## 2013-07-04 NOTE — Progress Notes (Signed)
Patient ID: [name], female   DOB: 01/20/1931, 80 y.o.   MRN: [id number] Notified by RN of acute R sided weakness. Definitely a change from earlier. She has been on a heparin drip. I saw her in CT - no acute change seen on CT head. Stroke team is seeing her and I spoke with Dr. Cyril Mourningamillo. They plan emergent cerebral angio. I called her daughter and updated her on the phone.  Heidi GelinasBurke Dayvon Dax, MD, MPH, FACS Trauma: [phone] General Surgery: [phone]

## 2013-07-04 NOTE — Consult Note (Addendum)
WOC ostomy follow up Stoma type/location: Pt had colostomy surgery yesterday.  Intubated and unable to discuss ostomy care at this time.  Daughter at bedside for teaching session.  She is a Publishing rights managernurse practitioner and states she is familiar with pouch application and emptying. Stomal assessment/size: Stoma red and viable, 11/2 inches, below skin level. Peristomal assessment: Intact skin surrounding.  Deep crease has been created by stoma which was not present during marking assessment. Output 50cc liquid brown stool Ostomy pouching: 1pc. Education provided: Demonstrated application of one piece pouch to daughter at bedside.  Barrier ring applied to maintain seal.  Daughter able to open and close velcro to empty. Will follow when stable and out of ICU for further teaching sessions.  Supplies ordered to bedside for staff use. Cammie Mcgeeawn Mahala Rommel MSN, RN, CWOCN, SebastianWCN-AP, CNS [phone]

## 2013-07-04 NOTE — Evaluation (Signed)
Occupational Therapy Evaluation Patient Details Name: Heidi HanlonCynthia A Netzer MRN: [id number] DOB: 10/24/1930 Today's Date: 06/26/2013    History of Present Illness Patient is an 80 year old female who was recently the hospital with pneumonia and the flu who subsequently developed diverticulitis with diverticular abscess.  A drain was placed. There was stool leaking from the abscess cavity. She has had her drain attempted to be upsized and continued to have worsening pain this week. She has continued to become weaker and unable to get out of bed. She had significant pain and cellulitis of the drain insertion site.  Denies fevers and chills. She has also been on antibiotics throughout this time. She's been on TNA and has been n.p.o. other than her pills. 06/29/2013 SIGMOID COLECTOMY; HARTMANN'S PROCEDURE WITH COLOSTOMY; I&D LEFT FLANK DRAIN SITE and Bilateral ureteral stent placement.   Clinical Impression   This 80 yo female admitted and underwent above presents to acute OT with almost absent movement on right side (mild movement to pain), lethargy, left gaze deviation, decreased balance, decreased following commands all affecting pt's ability to care for herself at home. Will benefit from acute OT with follow up on CIR.    Follow Up Recommendations  CIR    Equipment Recommendations   (TBD at next venue)       Precautions / Restrictions Precautions Precaution Comments: L inguinal penrose drain, left lower abdominal colostomy, midline abdominal incision Restrictions Weight Bearing Restrictions: No      Mobility Bed Mobility Overal bed mobility: Needs Assistance;+2 for physical assistance Bed Mobility: Rolling;Sidelying to Sit;Sit to Supine Rolling: Total assist;+2 for physical assistance Sidelying to sit: Total assist;+2 for physical assistance   Sit to supine: Total assist;+2 for physical assistance      Transfers                 General transfer comment: Unable at this time due to  lethargy    Balance Overall balance assessment: Needs assistance Sitting-balance support: Feet supported;Single extremity supported Sitting balance-Leahy Scale: Zero Sitting balance - Comments: A times she was total A and at other times min A (at EOB). While seated EOB she would occassionally pull with her LUE, would grab onto anything that came in contact with her hand, flexed her LLE back under her more (when asked to kick her leg out)                            ADL                     General ADL Comments: Pt currently total A for BADLs      Vision   Alignment/Gaze Preference: Gaze left             Additional Comments: Unable to test at this time due to pt's lethargy   Perception     Praxis      Pertinent Vitals/Pain Stable during entire session     Hand Dominance     Extremity/Trunk Assessment Upper Extremity Assessment Upper Extremity Assessment: RUE deficits/detail;LUE deficits/detail RUE Deficits / Details: No active movement noted, only grimace x1 to nail bed pressure; PROM WNL RUE Coordination: decreased fine motor;decreased gross motor LUE Deficits / Details: with noxious stimulation she moves arm, grabs onto anything put in her hand, did loosen grip on my hand x 1 when asked her to   Lower Extremity Assessment Lower Extremity Assessment: Defer to PT evaluation  Communication     Cognition Arousal/Alertness: Lethargic (? fatigue from vent wean and/or CVA)                         General Comments       Exercises      Home Living Family/patient expects to be discharged to:: Private residence Living Arrangements: Children                               Additional Comments: Unknown, pt on OETT and not trying to communicate and no family available      Prior Functioning/Environment          Comments: Unknown, pt on OETT and not trying to communicate and no family available       OT Problem  List: Decreased strength;Decreased range of motion;Decreased activity tolerance;Impaired balance (sitting and/or standing);Decreased cognition;Decreased coordination;Impaired vision/perception;Decreased knowledge of use of DME or AE;Obesity;Impaired UE functional use;Impaired tone;Increased edema   OT Treatment/Interventions: Self-care/ADL training;Therapeutic exercise;Neuromuscular education;DME and/or AE instruction;Cognitive remediation/compensation;Therapeutic activities;Visual/perceptual remediation/compensation;Patient/family education;Balance training    OT Goals(Current goals can be found in the care plan section) Acute Rehab OT Goals Patient Stated Goal: pt unable to state and no family available OT Goal Formulation: Patient unable to participate in goal setting Time For Goal Achievement: 07/18/13 Potential to Achieve Goals: Fair  OT Frequency: Min 2X/week   Barriers to D/C:  (unknown)          End of Session:    Activity Tolerance: Patient limited by fatigue;Patient limited by lethargy Patient left: in bed;with nursing/sitter in room   Time: 1429-1503 OT Time Calculation (min): 34 min Charges:  OT General Charges $OT Visit: 1 Procedure OT Evaluation $Initial OT Evaluation Tier I: 1 Procedure OT Treatments $Therapeutic Activity: 8-22 mins  Evette Georges [phone] 07/05/2013, 3:31 PM

## 2013-07-04 NOTE — Anesthesia Preprocedure Evaluation (Addendum)
Anesthesia Evaluation  Patient identified by MRN, date of birth, ID band Patient unresponsive  General Assessment Comment:Patient in IR room upon my arrival, patient remained intubated from 3/25 surgery, arterial line and cvl in place from 3/25, patient unresponsive as reportedly sedated by IR team and s/p left mca stroke.   History of Anesthesia Complications Negative for: history of anesthetic complications  Airway      Comment: ETT in place, 7.5 hilo evac Dental   Pulmonary sleep apnea , COPD COPD inhaler and oxygen dependent, former smoker,  Severe copd with 2l continuous oxygen and inhaler, dlco mid 40%, FVC and FEV1 60s%, intubated 3/25 with 7.5 hilo evac ett for surgery  Equal bilaterally        Cardiovascular hypertension, Pt. on medications - angina+ Peripheral Vascular Disease and +CHF + dysrhythmias Atrial Fibrillation Rhythm:Irregular Rate:Normal     Neuro/Psych Acute left mca ischemic stroke CVA    GI/Hepatic Neg liver ROS, GERD-  Medicated and Controlled,S/p colorectal surgery for colonic abscess and fistula   Endo/Other  diabetes, Type 2, Insulin DependentHypothyroidism Arrive on insulin infusion  Renal/GU Renal InsufficiencyRenal disease     Musculoskeletal   Abdominal (+) + obese, Ostomy and surgical dressing in place  Peds  Hematology  (+) anemia , Chronic anticoagulation for afib, on heparin gtt prior to 3/25 surgery and reportedly restarted post op   Anesthesia Other Findings   Reproductive/Obstetrics                          Anesthesia Physical Anesthesia Plan  ASA: IV and emergent  Anesthesia Plan: General   Post-op Pain Management:    Induction: Inhalational  Airway Management Planned: Oral ETT  Additional Equipment: Arterial line and CVP  Intra-op Plan:   Post-operative Plan: Post-operative intubation/ventilation  Informed Consent: I have reviewed the patients  History and Physical, chart, labs and discussed the procedure including the risks, benefits and alternatives for the proposed anesthesia with the patient or authorized representative who has indicated his/her understanding and acceptance.   History available from chart only  Plan Discussed with: CRNA and Surgeon  Anesthesia Plan Comments: (Chart review and previous knowledge of patient from personally performed case on 3/25, IR staff with no additional information on patient history or condition)       Anesthesia Quick Evaluation

## 2013-07-04 NOTE — Plan of Care (Signed)
Problem: Phase I Progression Outcomes Goal: Pain controlled with appropriate interventions Outcome: Progressing Pain controlled with Fentanyl gtt at . Goal: OOB as tolerated unless otherwise ordered Outcome: Not Progressing Remains intubated Goal: Incision/dressings dry and intact Outcome: Not Progressing Abd wound remains open and packed

## 2013-07-05 ENCOUNTER — Inpatient Hospital Stay (HOSPITAL_COMMUNITY): Payer: Medicare Other

## 2013-07-05 ENCOUNTER — Encounter (HOSPITAL_COMMUNITY): Payer: Self-pay | Admitting: General Surgery

## 2013-07-05 DIAGNOSIS — I369 Nonrheumatic tricuspid valve disorder, unspecified: Secondary | ICD-10-CM

## 2013-07-05 DIAGNOSIS — I498 Other specified cardiac arrhythmias: Secondary | ICD-10-CM

## 2013-07-05 LAB — LIPID PANEL
CHOL/HDL RATIO: 12.1 ratio
Cholesterol: 121 mg/dL (ref 0–200)
HDL: 10 mg/dL — ABNORMAL LOW (ref >39)
LDL CALC: 38 mg/dL (ref 0–99)
TRIGLYCERIDES: 367 mg/dL — AB (ref <150)
VLDL: 73 mg/dL — AB (ref 0–40)

## 2013-07-05 LAB — GLUCOSE, CAPILLARY
GLUCOSE-CAPILLARY: 153 mg/dL — AB (ref 70–99)
GLUCOSE-CAPILLARY: 217 mg/dL — AB (ref 70–99)
GLUCOSE-CAPILLARY: 165 mg/dL — AB (ref 70–99)
GLUCOSE-CAPILLARY: 170 mg/dL — AB (ref 70–99)
Glucose-Capillary: 113 mg/dL — ABNORMAL HIGH (ref 70–99)
Glucose-Capillary: 123 mg/dL — ABNORMAL HIGH (ref 70–99)
Glucose-Capillary: 127 mg/dL — ABNORMAL HIGH (ref 70–99)
Glucose-Capillary: 128 mg/dL — ABNORMAL HIGH (ref 70–99)
Glucose-Capillary: 158 mg/dL — ABNORMAL HIGH (ref 70–99)
Glucose-Capillary: 163 mg/dL — ABNORMAL HIGH (ref 70–99)
Glucose-Capillary: 196 mg/dL — ABNORMAL HIGH (ref 70–99)
Glucose-Capillary: 238 mg/dL — ABNORMAL HIGH (ref 70–99)

## 2013-07-05 LAB — CBC WITH DIFFERENTIAL/PLATELET
BASOS ABS: 0 10*3/uL (ref 0.0–0.1)
Basophils Relative: 0 % (ref 0–1)
Eosinophils Absolute: 0 10*3/uL (ref 0.0–0.7)
Eosinophils Relative: 0 % (ref 0–5)
HEMATOCRIT: 30.7 % — AB (ref 36.0–46.0)
Hemoglobin: 10 g/dL — ABNORMAL LOW (ref 12.0–15.0)
LYMPHS PCT: 4 % — AB (ref 12–46)
Lymphs Abs: 0.7 10*3/uL (ref 0.7–4.0)
MCH: 28.9 pg (ref 26.0–34.0)
MCHC: 32.6 g/dL (ref 30.0–36.0)
MCV: 88.7 fL (ref 78.0–100.0)
MONO ABS: 0.5 10*3/uL (ref 0.1–1.0)
MONOS PCT: 3 % (ref 3–12)
Neutro Abs: 16.3 10*3/uL — ABNORMAL HIGH (ref 1.7–7.7)
Neutrophils Relative %: 94 % — ABNORMAL HIGH (ref 43–77)
Platelets: 291 10*3/uL (ref 150–400)
RBC: 3.46 MIL/uL — AB (ref 3.87–5.11)
RDW: 20.5 % — ABNORMAL HIGH (ref 11.5–15.5)
WBC: 17.5 10*3/uL — AB (ref 4.0–10.5)

## 2013-07-05 LAB — COMPREHENSIVE METABOLIC PANEL
ALT: 14 U/L (ref 0–35)
AST: 19 U/L (ref 0–37)
Albumin: 1.5 g/dL — ABNORMAL LOW (ref 3.5–5.2)
Alkaline Phosphatase: 97 U/L (ref 39–117)
BUN: 68 mg/dL — AB (ref 6–23)
CALCIUM: 7.7 mg/dL — AB (ref 8.4–10.5)
CO2: 26 mEq/L (ref 19–32)
Chloride: 93 mEq/L — ABNORMAL LOW (ref 96–112)
Creatinine, Ser: 1.06 mg/dL (ref 0.50–1.10)
GFR calc Af Amer: 56 mL/min — ABNORMAL LOW (ref >90)
GFR calc non Af Amer: 48 mL/min — ABNORMAL LOW (ref >90)
GLUCOSE: 154 mg/dL — AB (ref 70–99)
Potassium: 4.4 mEq/L (ref 3.7–5.3)
SODIUM: 132 meq/L — AB (ref 137–147)
TOTAL PROTEIN: 5.7 g/dL — AB (ref 6.0–8.3)
Total Bilirubin: <0.2 mg/dL — ABNORMAL LOW (ref 0.3–1.2)

## 2013-07-05 LAB — MAGNESIUM
Magnesium: 1.9 mg/dL (ref 1.5–2.5)
Magnesium: 1.9 mg/dL (ref 1.5–2.5)

## 2013-07-05 LAB — BASIC METABOLIC PANEL
BUN: 69 mg/dL — AB (ref 6–23)
CO2: 23 mEq/L (ref 19–32)
Calcium: 7.6 mg/dL — ABNORMAL LOW (ref 8.4–10.5)
Chloride: 95 mEq/L — ABNORMAL LOW (ref 96–112)
Creatinine, Ser: 1.02 mg/dL (ref 0.50–1.10)
GFR calc Af Amer: 58 mL/min — ABNORMAL LOW (ref >90)
GFR, EST NON AFRICAN AMERICAN: 50 mL/min — AB (ref >90)
GLUCOSE: 200 mg/dL — AB (ref 70–99)
POTASSIUM: 4.4 meq/L (ref 3.7–5.3)
Sodium: 131 mEq/L — ABNORMAL LOW (ref 137–147)

## 2013-07-05 LAB — HEMOGLOBIN A1C
HEMOGLOBIN A1C: 6.2 % — AB (ref <5.7)
MEAN PLASMA GLUCOSE: 131 mg/dL — AB (ref <117)

## 2013-07-05 LAB — PHOSPHORUS: PHOSPHORUS: 4.6 mg/dL (ref 2.3–4.6)

## 2013-07-05 MED ORDER — DEXTROSE 10 % IV SOLN: INTRAVENOUS | Status: DC | PRN

## 2013-07-05 MED ORDER — SODIUM CHLORIDE 0.9 % IV SOLN
INTRAVENOUS | Status: DC
  Filled 2013-07-05: qty 1

## 2013-07-05 MED ORDER — INSULIN GLARGINE 100 UNIT/ML ~~LOC~~ SOLN
10.0000 [IU] | Freq: Every day | SUBCUTANEOUS | Status: DC
  Administered 2013-07-05 (×2): 10 [IU] via SUBCUTANEOUS
  Filled 2013-07-05 (×3): qty 0.1

## 2013-07-05 MED ORDER — INSULIN ASPART 100 UNIT/ML ~~LOC~~ SOLN
2.0000 [IU] | SUBCUTANEOUS | Status: DC
  Administered 2013-07-05: 6 [IU] via SUBCUTANEOUS

## 2013-07-05 MED ORDER — ASPIRIN 325 MG PO TABS
325.0000 mg | ORAL_TABLET | Freq: Every day | ORAL | Status: DC
  Administered 2013-07-05 – 2013-07-06 (×2): 325 mg
  Filled 2013-07-05 (×3): qty 1

## 2013-07-05 MED ORDER — TRACE MINERALS CR-CU-F-FE-I-MN-MO-SE-ZN IV SOLN
INTRAVENOUS | Status: DC
  Administered 2013-07-05: 18:00:00 via INTRAVENOUS
  Filled 2013-07-05: qty 2000

## 2013-07-05 MED ORDER — HYDROCORTISONE SOD SUCCINATE 100 MG IJ SOLR
25.0000 mg | Freq: Four times a day (QID) | INTRAMUSCULAR | Status: DC
  Administered 2013-07-05 – 2013-07-06 (×4): 25 mg via INTRAVENOUS
  Filled 2013-07-05 (×8): qty 0.5

## 2013-07-05 MED ORDER — FAT EMULSION 20 % IV EMUL
250.0000 mL | INTRAVENOUS | Status: DC
  Administered 2013-07-05: 250 mL via INTRAVENOUS
  Filled 2013-07-05: qty 250

## 2013-07-05 NOTE — Progress Notes (Signed)
ANTIBIOTIC CONSULT NOTE - INITIAL  Pharmacy Consult for Vancomycin Indication: cellulitis  Allergies  Allergen Reactions  . Sulfonamide Derivatives Other (See Comments)    Childhood reaction    Patient Measurements: Height: 5\' 3"  (160 cm) Weight: 194 lb 0.1 oz (88 kg) IBW/kg (Calculated) : 52.4  Vital Signs: Temp: 97.7 F (36.5 C) (03/27 0700) Temp src: Axillary (03/27 0700) BP: 140/58 mmHg (03/27 0817) Pulse Rate: 64 (03/27 0817) Intake/Output from previous day: 03/26 0701 - 03/27 0700 In: 1262.6 [I.V.:317.6; IV Piggyback:350; TPN:595] Out: 2195 [Urine:2055; Drains:10; Blood:130] Intake/Output from this shift:    Labs:  Recent Labs  07/02/2013 2000 06/11/2013 0340 07/02/2013 1815 07/05/13 0245  WBC 18.0* 16.5*  --  17.5*  HGB 11.8* 11.4* 9.5* 10.0*  PLT 320 306  --  291  CREATININE 1.21* 1.21*  --  1.06   Estimated Creatinine Clearance: 43.8 ml/min (by C-G formula based on Cr of 1.06). No results found for this basename: Rolm GalaVANCOTROUGH, VANCOPEAK, VANCORANDOM, GENTTROUGH, GENTPEAK, GENTRANDOM, TOBRATROUGH, TOBRAPEAK, TOBRARND, AMIKACINPEAK, AMIKACINTROU, AMIKACIN,  in the last 72 hours  Assessment: 80 yo female with diverticular abcess s/p drain 2/27, now with surrounding cellulitis for empiric antibiotics.  Renal function remains stable.  Vancomycin trough 18.6 on 07/01/13 on a dose of 750mg  IV q24.  Dose adjusted to 500mg  IV q24.  Goal of Therapy:  Vancomycin trough level 10-15 mcg/ml  Plan:  Continue vancomycin 500 mg IV q24h  Mickeal SkinnerFrens, Xenia Nile John 07/05/2013,9:36 AM

## 2013-07-05 NOTE — Progress Notes (Signed)
Rehab Admissions Coordinator Note:  Patient was screened by [name], [name] for appropriateness for an Inpatient Acute Rehab Consult per PT recs yesterday prior to Code stroke being called.  At this time, we are recommending await medical progress before determining needs.[name].  [name] 07/05/2013, 10:22 AM  I can be reached at [phone].

## 2013-07-05 NOTE — Progress Notes (Signed)
PULMONARY / CRITICAL CARE MEDICINE  Name: [name] MRN: [id number] DOB: [birth year]    ADMISSION DATE:  06/20/2013 CONSULTATION DATE: 06/15/2013  REFERRING MD :  EDP PRIMARY SERVICE:  PCCM  CHIEF COMPLAINT:  Chronic lung disease / perioperative management  BRIEF PATIENT DESCRIPTION:  80 yo with COPD admitted 3/20 with diverticular abscess s/p sigmoid colectomy, colostomy, cystoscopy and urethral stent placement.  SIGNIFICANT EVENTS / STUDIES:  3/25  OR >>>  Sigmoid colectomy, colostomy, cystoscopy and urethral stent placement. 3/26- MCi stroke, IR, vent   LINES / TUBES: OETT 3/25 >>> OGT 3/25 >>> Foley 3/25 >>> R IJ CVL 3/25 >>> R PICC ??? >>> L R A-line 3/25 >>>  CULTURES: 3/22 Urine >>> YEAST  ANTIBIOTICS: Invanz 3/25 >>> Vancomycin 3/25 >>> Diflucan 3/25 >>>  INTERVAL HISTORY:  Weaning on vent  VITAL SIGNS: Temp:  [97.5 F (36.4 C)-98.8 F (37.1 C)] 97.7 F (36.5 C) (03/27 0700) Pulse Rate:  [47-109] 64 (03/27 0817) Resp:  [11-21] 13 (03/27 0817) BP: (92-168)/(40-90) 140/58 mmHg (03/27 0817) SpO2:  [93 %-100 %] 100 % (03/27 0817) Arterial Line BP: (88-172)/(43-111) 138/59 mmHg (03/27 0800) FiO2 (%):  [40 %-100 %] 40 % (03/27 0817) Weight:  [88 kg (194 lb 0.1 oz)] 88 kg (194 lb 0.1 oz) (03/27 0430) HEMODYNAMICS:   VENTILATOR SETTINGS: Vent Mode:  [-] PRVC FiO2 (%):  [40 %-100 %] 40 % Set Rate:  [14 bmp] 14 bmp Vt Set:  [500 mL] 500 mL PEEP:  [5 cmH20] 5 cmH20 Pressure Support:  [5 cmH20] 5 cmH20 Plateau Pressure:  [13 cmH20-20 cmH20] 18 cmH20 INTAKE / OUTPUT: Intake/Output     03/26 0701 - 03/27 0700 03/27 0701 - 03/28 0700   I.V. (mL/kg) 317.6 (3.6)    Blood     IV Piggyback 350    TPN 595    Total Intake(mL/kg) 1262.6 (14.3)    Urine (mL/kg/hr) 2055 (1) 300 (1)   Drains 10 (0)    Blood 130 (0.1)    Total Output 2195 300   Net -932.4 -300          PHYSICAL EXAMINATION: General:  No distress  Neuro:  Moves left, int awake, int follows  commands HEENT:  PERRL, OETT / OGT Cardiovascular:  Regular, s1 s 2 Lungs: reduced Abdomen:  Surgical depressing dry, minimal JP drainage, bowel sounds absent, ostomy wnl Musculoskeletal:  Trace edema Skin:  Intact  LABS: CBC  Recent Labs Lab 06/24/2013 2000 07/07/2013 0340 06/18/2013 1815 07/05/13 0245  WBC 18.0* 16.5*  --  17.5*  HGB 11.8* 11.4* 9.5* 10.0*  HCT 35.1* 34.1* 28.0* 30.7*  PLT 320 306  --  291   Coag's  Recent Labs Lab 06/29/13 0519 06/30/13 0500 07/01/13 0500  INR 2.89* 2.95* 1.05   BMET  Recent Labs Lab 06/23/2013 2000 06/19/2013 0340 06/27/2013 1815 07/05/13 0245  NA 130* 132* 128* 132*  K 5.1 5.4* 4.1 4.4  CL 90* 91*  --  93*  CO2 30 30  --  26  BUN 61* 64*  --  68*  CREATININE 1.21* 1.21*  --  1.06  GLUCOSE 274* 251*  --  154*   Electrolytes  Recent Labs Lab 07/02/13 0455  06/15/2013 2000 06/29/2013 0340 07/05/13 0245  CALCIUM 9.2  < > 8.5 8.4 7.7*  MG 1.9  --   --  2.0 1.9  PHOS 4.0  --   --  5.5* 4.6  < > = values in this interval not displayed.  Sepsis Markers No results found for this basename: LATICACIDVEN, PROCALCITON, O2SATVEN,  in the last 168 hours ABG  Recent Labs Lab 06/18/2013 1649 07/09/2013 0512 06/17/2013 1815  PHART 7.416 7.352 7.412  PCO2ART 51.5* 53.0* 45.5*  PO2ART 137.0* 118.0* 181.0*   Liver Enzymes  Recent Labs Lab 06/18/2013 0500 06/20/2013 0340 07/05/13 0245  AST '17 21 19  ' ALT '9 14 14  ' ALKPHOS 89 102 97  BILITOT 0.3 0.2* <0.2*  ALBUMIN 1.8* 1.8* 1.5*   Cardiac Enzymes No results found for this basename: TROPONINI, PROBNP,  in the last 168 hours Glucose  Recent Labs Lab 06/22/2013 2232 07/02/2013 2341 07/05/13 0040 07/05/13 0145 07/05/13 0405 07/05/13 0808  GLUCAP 119* 128* 127* 113* 123* 153*   IMAGING: Ct Head Wo Contrast  06/12/2013   CLINICAL DATA:  Post interventional radiology procedure. Code stroke. Left MCA occlusion on preceding catheter angiogram.  EXAM: CT HEAD WITHOUT CONTRAST  TECHNIQUE:  Contiguous axial images were obtained from the base of the skull through the vertex without intravenous contrast.  COMPARISON:  CT HEAD W/O CM dated 06/30/2013; IR ANGIO INTRA EXTRACRAN SEL COM CAROTID INNOMINATE UNI R MOD SED dated 06/10/2013  FINDINGS: There are new areas of contrast staining in the left basal ganglia and left frontal lobe in the region of the operculum as well as in the insula. There is some new, subtle hypoattenuation in the left MCA territory involving the temporal and parietal lobes with mild sulcal effacement consistent with evolving acute infarct.  No definite acute intracranial hemorrhage is identified. Moderate cerebral atrophy is unchanged. Periventricular white-matter hypodensities are similar to the prior exam and compatible with moderate chronic small vessel ischemic disease. There is no midline shift or extra-axial fluid collection. Orbits are unremarkable. Left maxillary sinus mucous retention cyst is noted as well as right sphenoid sinus mucosal thickening. Mastoid air cells are clear.  IMPRESSION: Evolving, acute left MCA infarct with new areas of contrast staining in the basal ganglia and left frontal lobe following catheter angiogram and new evidence of cytotoxic edema in the left temporal and parietal regions.   Electronically Signed   By: Logan Bores   On: 06/20/2013 19:34   Ct Head Wo Contrast  06/13/2013   CLINICAL DATA:  Code stroke, assess for bleed.  EXAM: CT HEAD WITHOUT CONTRAST  TECHNIQUE: Contiguous axial images were obtained from the base of the skull through the vertex without intravenous contrast.  COMPARISON:  CT HEAD W/O CM dated 06/07/2013; DG CHEST 1V PORT dated 06/25/2013  FINDINGS: The ventricles and sulci are normal for age. No intraparenchymal hemorrhage, mass effect nor midline shift. Patchy supratentorial white matter hypodensities are within normal range for patient's age and though non-specific suggest sequelae of chronic small vessel ischemic disease.  No acute large vascular territory infarcts.  No abnormal extra-axial fluid collections. Basal cisterns are patent. Moderate calcific atherosclerosis of the carotid siphons.  No skull fracture. Left maxillary mucosal retention cysts without paranasal sinus air-fluid levels. Mild residual right sphenoid mucosal thickening, improved. Mastoid air cells appear well-aerated. The included ocular globes and orbital contents are non-suspicious. Life support lines in place.  IMPRESSION: No acute intracranial process, specific no intracranial hemorrhage. If clinical assessment for acute ischemia, MRI of the brain with diffusion-weighted sequences could be performed.  Involutional changes. Moderate white matter changes suggest chronic small vessel ischemic disease, similar.  Findings discussed with and reconfirmed by Dr.Bert Thompsonon 06/27/2013 3:35 PM.   Electronically Signed   By: Elon Alas  On: 06/15/2013 15:44   Dg Chest Port 1 View  07/05/2013   CLINICAL DATA:  Respiratory failure.  EXAM: PORTABLE CHEST - 1 VIEW  COMPARISON:  06/18/2013  FINDINGS: Endotracheal tube has tip 6 cm above the carina. Right-sided PICC line and right IJ central venous catheter as well as the nasogastric tube are unchanged. Lungs are somewhat hypoinflated with continued moderate opacification over the left base likely combination of moderate size effusion with atelectasis. Mild hazy density over the right base without significant change likely due to combination of small effusion with atelectasis. Cannot completely exclude infection in the lung bases. Remainder of the exam is unchanged.  IMPRESSION: No significant change in moderate left base opacification and mild right base opacification likely effusions with associated atelectasis. Cannot exclude infection.  Tubes and lines unchanged.   Electronically Signed   By: Marin Olp M.D.   On: 07/05/2013 08:03   Dg Chest Port 1 View  06/30/2013   CLINICAL DATA:  Sigmoid colectomy   EXAM: PORTABLE CHEST - 1 VIEW  COMPARISON:  DG CHEST 1V PORT dated 07/02/2013  FINDINGS: Tubular device is stable. Right pleural effusion worse. Left pleural effusion and basilar opacity stable. No pneumothorax. Vascular congestion and interstitial prominence are stable. Cardiomegaly.  IMPRESSION: Bilateral pleural effusions. It is stable on the left and increased on the right.   Electronically Signed   By: Maryclare Bean M.D.   On: 06/20/2013 08:16   Dg Chest Port 1 View  06/21/2013   CLINICAL DATA:  Three day history of respiratory failure; history of COPD and CHF and diabetes  EXAM: PORTABLE CHEST - 1 VIEW  COMPARISON:  DG CHEST 1V PORT dated 07/02/2013  FINDINGS: The patient is rotated on today's study. The lungs are adequately inflated. The interstitial markings remain increased bilaterally. Parenchymal consolidation at the left lung base is present and stable. The left hemidiaphragm is nearly totally obscured. The cardiac silhouette is mildly enlarged. The pulmonary vascularity is indistinct but not clearly engorged.  The patient has undergone interval intubation of the trachea. The endotracheal tube tip lies 5.2 cm above the crotch of the carina. The esophagogastric tube tip and proximal port project below the left hemidiaphragm. A PICC line via the right upper extremity has its tip in the proximal SVC. A right internal jugular venous catheter tip lies at approximately the same level.  IMPRESSION: 1. The lungs are adequately inflated and exhibit increased interstitial markings which are not new. Confluent density at the left lung base is consistent with atelectasis or pneumonia. There may be a small amount of pleural fluid on the left as well. 2. The support tubes and lines appear to be in appropriate position.   Electronically Signed   By: David  Martinique   On: 07/07/2013 17:05   ASSESSMENT / PLAN:  PULMONARY A:   Acute respirator failure Concern now type 4 resp failure COPD without exacerbation  P:    Supplemental oxygen PRN Pulmonary hygiene / IS / Flutter valve DuoNeb / Pulmicort scheduled, Albuterol PRN Wean this am , sbt  Attempt, unable to extubate with current mental status abg reviewed, reduce rate, avoid alk with copd  CARDIOVASCULAR A:  AF HTN Tachycardia Chronic diastolic heart failure P:  Metoprolol 5 to q4h, rate controlled tele   RENAL A:   CKD Bilateral urethral stents placement Hematuria, improving P:   Trend BMP in am  Allow pos balance  GASTROINTESTINAL A:   Diverticulitis with abscess / fistula s/o sigmoid colectomy, colostomy  Nutrition GI Px P:   NPO remains, follow ostomy output TNA Per CCS Pepcid  HEMATOLOGIC A:   Therapeutic anticoagulation for EF prior to surgery VTE Px is not indicated P:  Trend CBC SCD Hold Coumadin Heparin gtt started by CCS  INFECTIOUS A:   Intraabdominal abscess P:   Will establish stop date, follow clinical course  ENDOCRINE  A:   DM Hyperglycemia Hypothyroidism Suspected adrenal insufficiency - chronic steroids P:   D/c SSI Start Insulin gtt Synthroid, convert to IV Hydrocortisone, if remains no pressors can reduce  NEUROLOGIC A:   Post op pain Vent synchrony Left acute cva P:   Limit sedation WUA  I have personally obtained history, examined patient, evaluated and interpreted laboratory and imaging results, reviewed medical records, formulated assessment / plan and placed orders.  CRITICAL CARE:  The patient is critically ill with multiple organ systems failure and requires high complexity decision making for assessment and support, frequent evaluation and titration of therapies, application of advanced monitoring technologies and extensive interpretation of multiple databases. Critical Care Time devoted to patient care services described in this note is 30 minutes.   Raylene Miyamoto., MD Pulmonary and [place] Pager: [phone]  07/05/2013,  10:19 AM

## 2013-07-05 NOTE — Progress Notes (Signed)
2 Days Post-Op  Subjective: On vent  Objective: Vital signs in last 24 hours: Temp:  [97.5 F (36.4 C)-98.8 F (37.1 C)] 97.7 F (36.5 C) (03/27 0700) Pulse Rate:  [47-143] 64 (03/27 0817) Resp:  [11-21] 13 (03/27 0817) BP: (92-168)/(40-90) 140/58 mmHg (03/27 0817) SpO2:  [93 %-100 %] 100 % (03/27 0817) Arterial Line BP: (88-172)/(43-111) 138/59 mmHg (03/27 0800) FiO2 (%):  [40 %-100 %] 40 % (03/27 0817) Weight:  [194 lb 0.1 oz (88 kg)] 194 lb 0.1 oz (88 kg) (03/27 0430) Last BM Date: 06/13/2013  Intake/Output from previous day: 03/26 0701 - 03/27 0700 In: 1262.6 [I.V.:317.6; IV Piggyback:350; TPN:595] Out: 2195 [Urine:2055; Drains:10; Blood:130] Intake/Output this shift:    General appearance: no distress Eyes: PERL L gaze Resp: some wheeze B Cardio: irregularly irregular rhythm GI: soft, a few BS, colostomy pink with small output, L flank penrose in drain site mild SS drainage and less erythema, midline wound clean with WTD Neuro: PERL, L gaze, min movement R arm to pain, F/C LUE  Lab Results:   Recent Labs  06/25/2013 0340 06/09/2013 1815 07/05/13 0245  WBC 16.5*  --  17.5*  HGB 11.4* 9.5* 10.0*  HCT 34.1* 28.0* 30.7*  PLT 306  --  291   BMET  Recent Labs  07/03/2013 0340 07/05/2013 1815 07/05/13 0245  NA 132* 128* 132*  K 5.4* 4.1 4.4  CL 91*  --  93*  CO2 30  --  26  GLUCOSE 251*  --  154*  BUN 64*  --  68*  CREATININE 1.21*  --  1.06  CALCIUM 8.4  --  7.7*   PT/INR No results found for this basename: LABPROT, INR,  in the last 72 hours ABG  Recent Labs  06/17/2013 0512 06/27/2013 1815  PHART 7.352 7.412  HCO3 28.7* 29.0*    Studies/Results: Ct Head Wo Contrast  06/25/2013   CLINICAL DATA:  Post interventional radiology procedure. Code stroke. Left MCA occlusion on preceding catheter angiogram.  EXAM: CT HEAD WITHOUT CONTRAST  TECHNIQUE: Contiguous axial images were obtained from the base of the skull through the vertex without intravenous contrast.   COMPARISON:  CT HEAD W/O CM dated 06/09/2013; IR ANGIO INTRA EXTRACRAN SEL COM CAROTID INNOMINATE UNI R MOD SED dated 06/27/2013  FINDINGS: There are new areas of contrast staining in the left basal ganglia and left frontal lobe in the region of the operculum as well as in the insula. There is some new, subtle hypoattenuation in the left MCA territory involving the temporal and parietal lobes with mild sulcal effacement consistent with evolving acute infarct.  No definite acute intracranial hemorrhage is identified. Moderate cerebral atrophy is unchanged. Periventricular white-matter hypodensities are similar to the prior exam and compatible with moderate chronic small vessel ischemic disease. There is no midline shift or extra-axial fluid collection. Orbits are unremarkable. Left maxillary sinus mucous retention cyst is noted as well as right sphenoid sinus mucosal thickening. Mastoid air cells are clear.  IMPRESSION: Evolving, acute left MCA infarct with new areas of contrast staining in the basal ganglia and left frontal lobe following catheter angiogram and new evidence of cytotoxic edema in the left temporal and parietal regions.   Electronically Signed   By: Sebastian Ache   On: 06/28/2013 19:34   Ct Head Wo Contrast  07/02/2013   CLINICAL DATA:  Code stroke, assess for bleed.  EXAM: CT HEAD WITHOUT CONTRAST  TECHNIQUE: Contiguous axial images were obtained from the base of the  skull through the vertex without intravenous contrast.  COMPARISON:  CT HEAD W/O CM dated 06/07/2013; DG CHEST 1V PORT dated 07/01/2013  FINDINGS: The ventricles and sulci are normal for age. No intraparenchymal hemorrhage, mass effect nor midline shift. Patchy supratentorial white matter hypodensities are within normal range for patient's age and though non-specific suggest sequelae of chronic small vessel ischemic disease. No acute large vascular territory infarcts.  No abnormal extra-axial fluid collections. Basal cisterns are patent.  Moderate calcific atherosclerosis of the carotid siphons.  No skull fracture. Left maxillary mucosal retention cysts without paranasal sinus air-fluid levels. Mild residual right sphenoid mucosal thickening, improved. Mastoid air cells appear well-aerated. The included ocular globes and orbital contents are non-suspicious. Life support lines in place.  IMPRESSION: No acute intracranial process, specific no intracranial hemorrhage. If clinical assessment for acute ischemia, MRI of the brain with diffusion-weighted sequences could be performed.  Involutional changes. Moderate white matter changes suggest chronic small vessel ischemic disease, similar.  Findings discussed with and reconfirmed by Dr.Bert Thompsonon 07/09/2013 3:35 PM.   Electronically Signed   By: Awilda Metro   On: 07/07/2013 15:44   Dg Chest Port 1 View  07/05/2013   CLINICAL DATA:  Respiratory failure.  EXAM: PORTABLE CHEST - 1 VIEW  COMPARISON:  06/11/2013  FINDINGS: Endotracheal tube has tip 6 cm above the carina. Right-sided PICC line and right IJ central venous catheter as well as the nasogastric tube are unchanged. Lungs are somewhat hypoinflated with continued moderate opacification over the left base likely combination of moderate size effusion with atelectasis. Mild hazy density over the right base without significant change likely due to combination of small effusion with atelectasis. Cannot completely exclude infection in the lung bases. Remainder of the exam is unchanged.  IMPRESSION: No significant change in moderate left base opacification and mild right base opacification likely effusions with associated atelectasis. Cannot exclude infection.  Tubes and lines unchanged.   Electronically Signed   By: Elberta Fortis M.D.   On: 07/05/2013 08:03   Dg Chest Port 1 View  06/23/2013   CLINICAL DATA:  Sigmoid colectomy  EXAM: PORTABLE CHEST - 1 VIEW  COMPARISON:  DG CHEST 1V PORT dated 07-12-13  FINDINGS: Tubular device is stable.  Right pleural effusion worse. Left pleural effusion and basilar opacity stable. No pneumothorax. Vascular congestion and interstitial prominence are stable. Cardiomegaly.  IMPRESSION: Bilateral pleural effusions. It is stable on the left and increased on the right.   Electronically Signed   By: Maryclare Bean M.D.   On: 07/01/2013 08:16   Dg Chest Port 1 View  12-Jul-2013   CLINICAL DATA:  Three day history of respiratory failure; history of COPD and CHF and diabetes  EXAM: PORTABLE CHEST - 1 VIEW  COMPARISON:  DG CHEST 1V PORT dated 07/02/2013  FINDINGS: The patient is rotated on today's study. The lungs are adequately inflated. The interstitial markings remain increased bilaterally. Parenchymal consolidation at the left lung base is present and stable. The left hemidiaphragm is nearly totally obscured. The cardiac silhouette is mildly enlarged. The pulmonary vascularity is indistinct but not clearly engorged.  The patient has undergone interval intubation of the trachea. The endotracheal tube tip lies 5.2 cm above the crotch of the carina. The esophagogastric tube tip and proximal port project below the left hemidiaphragm. A PICC line via the right upper extremity has its tip in the proximal SVC. A right internal jugular venous catheter tip lies at approximately the same level.  IMPRESSION: 1.  The lungs are adequately inflated and exhibit increased interstitial markings which are not new. Confluent density at the left lung base is consistent with atelectasis or pneumonia. There may be a small amount of pleural fluid on the left as well. 2. The support tubes and lines appear to be in appropriate position.   Electronically Signed   By: David  [place]   On: 07/05/2013 17:05    Anti-infectives: Anti-infectives   Start     Dose/Rate Route Frequency Ordered Stop   07/05/2013 0600  vancomycin (VANCOCIN) 500 mg in sodium chloride 0.9 % 100 mL IVPB     500 mg 100 mL/hr over 60 Minutes Intravenous Every 24 hours 07/02/13  0230     07/02/13 1200  fluconazole (DIFLUCAN) IVPB 200 mg     200 mg 100 mL/hr over 60 Minutes Intravenous Every 24 hours 07/02/13 1128     06/27/2013 2300  ertapenem (INVANZ) 1 g in sodium chloride 0.9 % 50 mL IVPB     1 g 100 mL/hr over 30 Minutes Intravenous Every 24 hours 06/25/2013 2142     06/27/2013 2200  vancomycin (VANCOCIN) IVPB 750 mg/150 ml premix  Status:  Discontinued     750 mg 150 mL/hr over 60 Minutes Intravenous Every 24 hours 06/25/2013 2104 07/02/13 0230      Assessment/Plan: s/p Procedure(s): SIGMOID COLECTOMY; HARTMANN'S PROCEDURE WITH COLOSTOMY; I&D LEFT FLANK DRAIN SITE (N/A) CYSTOSCOPY WITH BILATERAL RETROGRADE PYELOGRAM/URETERAL STENT PLACEMENT  (Bilateral) S/P sigmoid colectomy/colostomy/I&D L flank drain site POD#2 Await return of bowel function, hope to start TF tomorrow ID - Vanc/Invanz/Diflucan VDRF/COPD - appreciate CCM management, I spoke with Dr. Tyson AliasFeinstein this AM L MCA CVA - S/P cerebral angio/TPA. On Cardene. F/U CT head done. Decision to resume Heparin pending per stroke team Afib - heparin held P neuro eval as above PCM - TNA Likely gout L 4th toe - treat when can take PO I spoke with her daughter at the bedside  LOS: 7 days    Carsin Randazzo E 07/05/2013

## 2013-07-05 NOTE — Progress Notes (Signed)
2 Days Post-Op  Subjective: CVA Cerebral arteriogram with L Middle cerebral artery IA tpa 3/26 On vent Minimal movement on Left None on Rt   Objective: Vital signs in last 24 hours: Temp:  [97.5 F (36.4 C)-98.8 F (37.1 C)] 97.7 F (36.5 C) (03/27 0700) Pulse Rate:  [47-120] 64 (03/27 0817) Resp:  [11-21] 13 (03/27 0817) BP: (92-168)/(40-90) 140/58 mmHg (03/27 0817) SpO2:  [93 %-100 %] 100 % (03/27 0817) Arterial Line BP: (88-172)/(43-111) 138/59 mmHg (03/27 0800) FiO2 (%):  [40 %-100 %] 40 % (03/27 0817) Weight:  [88 kg (194 lb 0.1 oz)] 88 kg (194 lb 0.1 oz) (03/27 0430) Last BM Date: 06/17/2013  Intake/Output from previous day: 03/26 0701 - 03/27 0700 In: 1262.6 [I.V.:317.6; IV Piggyback:350; TPN:595] Out: 2195 [Urine:2055; Drains:10; Blood:130] Intake/Output this shift:    PE:  Afeb; VSS On vent Pt can move Left hand slightly to command Moves Left foot slightly to command No movement on Rt at all Does not open eyes Rt groin : no bleeding Fem Arterial sheath intact Rt foot 1+ pulses Wbc 17.5 H/H stable   Lab Results:   Recent Labs  06/09/2013 0340 06/19/2013 1815 07/05/13 0245  WBC 16.5*  --  17.5*  HGB 11.4* 9.5* 10.0*  HCT 34.1* 28.0* 30.7*  PLT 306  --  291   BMET  Recent Labs  07/05/2013 0340 06/27/2013 1815 07/05/13 0245  NA 132* 128* 132*  K 5.4* 4.1 4.4  CL 91*  --  93*  CO2 30  --  26  GLUCOSE 251*  --  154*  BUN 64*  --  68*  CREATININE 1.21*  --  1.06  CALCIUM 8.4  --  7.7*   PT/INR No results found for this basename: LABPROT, INR,  in the last 72 hours ABG  Recent Labs  06/11/2013 0512 06/15/2013 1815  PHART 7.352 7.412  HCO3 28.7* 29.0*    Studies/Results: Ct Head Wo Contrast  06/14/2013   CLINICAL DATA:  Post interventional radiology procedure. Code stroke. Left MCA occlusion on preceding catheter angiogram.  EXAM: CT HEAD WITHOUT CONTRAST  TECHNIQUE: Contiguous axial images were obtained from the base of the skull through the  vertex without intravenous contrast.  COMPARISON:  CT HEAD W/O CM dated 06/27/2013; IR ANGIO INTRA EXTRACRAN SEL COM CAROTID INNOMINATE UNI R MOD SED dated 07/05/2013  FINDINGS: There are new areas of contrast staining in the left basal ganglia and left frontal lobe in the region of the operculum as well as in the insula. There is some new, subtle hypoattenuation in the left MCA territory involving the temporal and parietal lobes with mild sulcal effacement consistent with evolving acute infarct.  No definite acute intracranial hemorrhage is identified. Moderate cerebral atrophy is unchanged. Periventricular white-matter hypodensities are similar to the prior exam and compatible with moderate chronic small vessel ischemic disease. There is no midline shift or extra-axial fluid collection. Orbits are unremarkable. Left maxillary sinus mucous retention cyst is noted as well as right sphenoid sinus mucosal thickening. Mastoid air cells are clear.  IMPRESSION: Evolving, acute left MCA infarct with new areas of contrast staining in the basal ganglia and left frontal lobe following catheter angiogram and new evidence of cytotoxic edema in the left temporal and parietal regions.   Electronically Signed   By: Sebastian AcheAllen  Grady   On: 07/01/2013 19:34   Ct Head Wo Contrast  06/11/2013   CLINICAL DATA:  Code stroke, assess for bleed.  EXAM: CT HEAD WITHOUT CONTRAST  TECHNIQUE: Contiguous axial images were obtained from the base of the skull through the vertex without intravenous contrast.  COMPARISON:  CT HEAD W/O CM dated 06/07/2013; DG CHEST 1V PORT dated 06/19/2013  FINDINGS: The ventricles and sulci are normal for age. No intraparenchymal hemorrhage, mass effect nor midline shift. Patchy supratentorial white matter hypodensities are within normal range for patient's age and though non-specific suggest sequelae of chronic small vessel ischemic disease. No acute large vascular territory infarcts.  No abnormal extra-axial fluid  collections. Basal cisterns are patent. Moderate calcific atherosclerosis of the carotid siphons.  No skull fracture. Left maxillary mucosal retention cysts without paranasal sinus air-fluid levels. Mild residual right sphenoid mucosal thickening, improved. Mastoid air cells appear well-aerated. The included ocular globes and orbital contents are non-suspicious. Life support lines in place.  IMPRESSION: No acute intracranial process, specific no intracranial hemorrhage. If clinical assessment for acute ischemia, MRI of the brain with diffusion-weighted sequences could be performed.  Involutional changes. Moderate white matter changes suggest chronic small vessel ischemic disease, similar.  Findings discussed with and reconfirmed by Dr.Bert Thompsonon 07/07/2013 3:35 PM.   Electronically Signed   By: Awilda Metro   On: 06/10/2013 15:44   Dg Chest Port 1 View  07/05/2013   CLINICAL DATA:  Respiratory failure.  EXAM: PORTABLE CHEST - 1 VIEW  COMPARISON:  06/20/2013  FINDINGS: Endotracheal tube has tip 6 cm above the carina. Right-sided PICC line and right IJ central venous catheter as well as the nasogastric tube are unchanged. Lungs are somewhat hypoinflated with continued moderate opacification over the left base likely combination of moderate size effusion with atelectasis. Mild hazy density over the right base without significant change likely due to combination of small effusion with atelectasis. Cannot completely exclude infection in the lung bases. Remainder of the exam is unchanged.  IMPRESSION: No significant change in moderate left base opacification and mild right base opacification likely effusions with associated atelectasis. Cannot exclude infection.  Tubes and lines unchanged.   Electronically Signed   By: Elberta Fortis M.D.   On: 07/05/2013 08:03   Dg Chest Port 1 View  06/28/2013   CLINICAL DATA:  Sigmoid colectomy  EXAM: PORTABLE CHEST - 1 VIEW  COMPARISON:  DG CHEST 1V PORT dated 06/12/2013   FINDINGS: Tubular device is stable. Right pleural effusion worse. Left pleural effusion and basilar opacity stable. No pneumothorax. Vascular congestion and interstitial prominence are stable. Cardiomegaly.  IMPRESSION: Bilateral pleural effusions. It is stable on the left and increased on the right.   Electronically Signed   By: Maryclare Bean M.D.   On: 06/23/2013 08:16   Dg Chest Port 1 View  06/25/2013   CLINICAL DATA:  Three day history of respiratory failure; history of COPD and CHF and diabetes  EXAM: PORTABLE CHEST - 1 VIEW  COMPARISON:  DG CHEST 1V PORT dated 07/02/2013  FINDINGS: The patient is rotated on today's study. The lungs are adequately inflated. The interstitial markings remain increased bilaterally. Parenchymal consolidation at the left lung base is present and stable. The left hemidiaphragm is nearly totally obscured. The cardiac silhouette is mildly enlarged. The pulmonary vascularity is indistinct but not clearly engorged.  The patient has undergone interval intubation of the trachea. The endotracheal tube tip lies 5.2 cm above the crotch of the carina. The esophagogastric tube tip and proximal port project below the left hemidiaphragm. Burch PICC line via the right upper extremity has its tip in the proximal SVC. Burch right internal jugular venous  catheter tip lies at approximately the same level.  IMPRESSION: 1. The lungs are adequately inflated and exhibit increased interstitial markings which are not new. Confluent density at the left lung base is consistent with atelectasis or pneumonia. There may be Burch small amount of pleural fluid on the left as well. 2. The support tubes and lines appear to be in appropriate position.   Electronically Signed   By: David  [place]   On: 06/27/2013 17:05    Anti-infectives: Anti-infectives   Start     Dose/Rate Route Frequency Ordered Stop   07/02/2013 0600  vancomycin (VANCOCIN) 500 mg in sodium chloride 0.9 % 100 mL IVPB     500 mg 100 mL/hr over 60 Minutes  Intravenous Every 24 hours 07/02/13 0230     07/02/13 1200  fluconazole (DIFLUCAN) IVPB 200 mg     200 mg 100 mL/hr over 60 Minutes Intravenous Every 24 hours 07/02/13 1128     06/17/2013 2300  ertapenem (INVANZ) 1 g in sodium chloride 0.9 % 50 mL IVPB     1 g 100 mL/hr over 30 Minutes Intravenous Every 24 hours 07/03/2013 2142     06/27/2013 2200  vancomycin (VANCOCIN) IVPB 750 mg/150 ml premix  Status:  Discontinued     750 mg 150 mL/hr over 60 Minutes Intravenous Every 24 hours 06/19/2013 2104 07/02/13 0230      Assessment/Plan: s/p Procedure(s): SIGMOID COLECTOMY; HARTMANN'S PROCEDURE WITH COLOSTOMY; I&D LEFT FLANK DRAIN SITE (N/Burch) CYSTOSCOPY WITH BILATERAL RETROGRADE PYELOGRAM/URETERAL STENT PLACEMENT  (Bilateral)  CVA LMCA IA tpa 3/26 On vent- slight movement of hand and foot on Left Rt groin sheath intact For removal today- orders in Will report to Dr Corliss Skains   LOS: 7 days    [name] 07/05/2013

## 2013-07-05 NOTE — Progress Notes (Signed)
eLink Physician-Brief Progress Note Patient Name: Heidi HanlonCynthia A Hartzell DOB: 04/26/1930 MRN: [id number]  Date of Service  07/05/2013   HPI/Events of Note   Few short runs of HD stable VTach  eICU Interventions  Stat BMET, Mg   Intervention Category Intermediate Interventions: Arrhythmia - evaluation and management  MCQUAID, DOUGLAS 07/05/2013, 5:46 PM

## 2013-07-05 NOTE — Progress Notes (Signed)
Notified Dr. Kendrick FriesMcQuaid of pt's 7 beat run of vtach at 1723.  Also looked back at pt strip and pt had 5 beat run of vtach at 1439.  New orders of STAT BMET and mag.  Will continue to monitor pt.

## 2013-07-05 NOTE — Progress Notes (Signed)
PARENTERAL NUTRITION CONSULT NOTE - FOLLOW UP  Pharmacy Consult for TPN Indication: colocutaneous fistula  Allergies  Allergen Reactions  . Sulfonamide Derivatives Other (See Comments)    Childhood reaction    Patient Measurements: Height: 5\' 3"  (160 cm) Weight: 194 lb 0.1 oz (88 kg) IBW/kg (Calculated) : 52.4 Adjusted Body Weight: 65kg  Vital Signs: Temp: 97.7 F (36.5 C) (03/27 0700) Temp src: Axillary (03/27 0700) BP: 140/58 mmHg (03/27 0817) Pulse Rate: 64 (03/27 0817) Intake/Output from previous day: 03/26 0701 - 03/27 0700 In: 1262.6 [I.V.:317.6; IV Piggyback:350; TPN:595] Out: 2195 [Urine:2055; Drains:10; Blood:130] Intake/Output from this shift: Total I/O In: -  Out: 300 [Urine:300]  Labs:  Recent Labs  06/12/2013 2000 06/21/2013 0340 06/22/2013 1815 07/05/13 0245  WBC 18.0* 16.5*  --  17.5*  HGB 11.8* 11.4* 9.5* 10.0*  HCT 35.1* 34.1* 28.0* 30.7*  PLT 320 306  --  291     Recent Labs  06/18/2013 0500  06/21/2013 2000 06/30/2013 0340 06/19/2013 1815 07/05/13 0245  NA 135*  < > 130* 132* 128* 132*  K 4.4  < > 5.1 5.4* 4.1 4.4  CL 87*  --  90* 91*  --  93*  CO2 35*  --  30 30  --  26  GLUCOSE 132*  < > 274* 251*  --  154*  BUN 57*  --  61* 64*  --  68*  CREATININE 1.15*  --  1.21* 1.21*  --  1.06  CALCIUM 9.3  --  8.5 8.4  --  7.7*  MG  --   --   --  2.0  --  1.9  PHOS  --   --   --  5.5*  --  4.6  PROT 6.6  --   --  6.6  --  5.7*  ALBUMIN 1.8*  --   --  1.8*  --  1.5*  AST 17  --   --  21  --  19  ALT 9  --   --  14  --  14  ALKPHOS 89  --   --  102  --  97  BILITOT 0.3  --   --  0.2*  --  <0.2*  TRIG  --   --   --   --   --  367*  CHOLHDL  --   --   --   --   --  12.1  CHOL  --   --   --   --   --  121  < > = values in this interval not displayed. Estimated Creatinine Clearance: 43.8 ml/min (by C-G formula based on Cr of 1.06).    Recent Labs  07/05/13 0145 07/05/13 0405 07/05/13 0808  GLUCAP 113* 123* 153*   Insulin Requirements in the  past 24 hours:  insulin drip 3/26 - 3/27, transitioned to lantus and q4h SSI; lantus 10 units given at 03 am and scheduled for qhs   Current Nutrition:  Clinimix 5/15 at 1075mL/hr + 20% lipid emulsion at 6710mL/hr on MWF only- this will provide an average of 90 gm protein and 1484 kCal daily  Nutritional Goals:  1600-1800 kCal, 80-95 grams of protein per day per RD recs on 3/21  Admit:  83 YOF recently hospitaliazed for PNA and flu and developed diverticulitis with abscess which required drain placement. This week her drain was attempted to be upsized d/t stool in abscess cavity and she continued to have worsening pain and weakness.  GI: has been NPO except for medications; now s/p sigmoid colectomy/colostomy/I&D of left flank drain site on 3/25.   Prealbumin was 13.2 on 3/18- this admission it decreased to 10.5. IV pepcid in TPN; per [facility] note "hope to start TF tomorrow"  Endo: hx hypothyroid, TSH 06/22/2013 1.9, continues on Synthroid 50; hx DM- 06/22/2013 A1C 7%. prednisone was changed to stress-dose hydrocortisone IV; insulin drip stopped and lantus 10 qhs and SSI started; has 20 units/bag insulin in TPN  Lytes: Na 132, K 4.4, Phos 4.6 , Mag 1.9, CoCa = 9.7, CaxPhos = 44.6,  will add back electrolytes to TPN today, may need to alternate days without lytes  Renal: hx CKD; SCr 1.06; UOP 1 ml/kg/hr  Pulm: on vent post-op; budesonide, duonebs  Cards: VSS, bisoprolol changed to IV Metoprolol  Hepatobil: LFTs wnl except albumin is low at 1.5. Triglycerides remain elevated at 367.  Lipid provisions were adjusted 3/23.  Neuro:  A&O  ID: was on Flagyl and tigecycline at [facility], changed to ertapenem and vancomycin here- D#8 for both of those. WBC 17.5 afebrile.  Her Vancomycin regimen was adjusted 3/24 for a slightly elevated trough.  Anticoagulation: Afib - heparin was infusing previously for bridging while Coumadin is on hold, was off 3/25 for surgery and was restarted 3/26 AM; now off s/p tPA in  IR 3/26 for stroke   TPN Access: R PICC  TPN day#: 10 (started 3/17 in [facility])  Plan:  Add electrolytes back to TPN for today.  Clinimix E 5/15 at 87mL/hr. Continue 20% lipid emulsion at 75mL/hr on MWF only as triglycerides remain elevated.  This will provide an averaget of 90 gm protein and 1484 kCal per day. Pepcid in TPN bag. Daily IV Multivitamin in TPN; trace elements on MWF only due to national shortage Continue MIVF as 1/2NS at [facility]  Continue with 20 units of insulin in TPN bag. Lantus 10 qhs and SSI BMET, Mag, Phos tomorrow morning  Herby Abraham, Pharm.D. [phone] 07/05/2013 10:20 AM

## 2013-07-05 NOTE — Progress Notes (Signed)
Stroke Team Progress Note  HISTORY [name] is an 80 y.o. female who underwent a SIGMOID COLECTOMY; HARTMANN'S PROCEDURE WITH COLOSTOMY; on 07/02/2013. She has known Afib and was taken off her coumadin prior to surgery. patient had been restarted on Heparin 25-Jul-2013 and was doing well. She was last seen normal at 1200 that day.  At 2:30 PT noted she was not moving her right side and called the nurse. On arrival patient was intubated, following no commands, had a slight left gaze deviation, showed a right hemiplegia and moving left arm and leg antigravity. Patient was not a tPA candidate but was in the window for IA therapy. Pateint was taken to CT which showed no intracranial bleed. IR was called and patient was brought straight to IR for cerebral angiogram.   Date last known well: Date: 25-Jul-2013  Time last known well: Time: 12:00  tPA Given: No: recent surgery, IV heparin.  NIHSS: 20  SUBJECTIVE The patient's family said to bedside. The patient is intubated. Dr. Pearlean Brownie discussed the patient's condition with the family.  OBJECTIVE Most recent Vital Signs: Filed Vitals:   07/05/13 0500 07/05/13 0530 07/05/13 0600 07/05/13 0630  BP: 108/57 111/52 103/41 108/57  Pulse: 65 77 74 70  Temp:      TempSrc:      Resp: 14 17 16 13   Height:      Weight:      SpO2: 100% 100% 100% 100%   CBG (last 3)   Recent Labs  07/05/13 0040 07/05/13 0145 07/05/13 0405  GLUCAP 127* 113* 123*    IV Fluid Intake:   . sodium chloride 20 mL/hr (06/21/2013 1821)  . sodium chloride 75 mL/hr at 07/05/13 0600  . dextrose    . insulin (NOVOLIN-R) infusion 0.5 mL/hr at 07/05/13 0100  . niCARDipine 2.5 mg/hr (07/05/13 0700)  . TPN (CLINIMIX) Adult without lytes 75 mL/hr at 07/05/13 0600    MEDICATIONS  . alteplase  1-30 mg Intra-arterial to XRAY  . antiseptic oral rinse  15 mL Mouth Rinse QID  . budesonide  0.5 mg Nebulization BID  . chlorhexidine  15 mL Mouth Rinse BID  . ertapenem  1 g  Intravenous Q24H  . fluconazole (DIFLUCAN) IV  200 mg Intravenous Q24H  . hydrocortisone sodium succinate  50 mg Intravenous Q6H  . insulin glargine  10 Units Subcutaneous QHS  . ipratropium-albuterol  3 mL Nebulization Q4H  . levothyroxine  25 mcg Intravenous Daily  . metoprolol  5 mg Intravenous Q4H  . sodium chloride  10-40 mL Intracatheter Q12H  . vancomycin  500 mg Intravenous Q24H   PRN:  acetaminophen, acetaminophen, acetaminophen, acetaminophen, albuterol, barrier cream, morphine injection, ondansetron, ondansetron (ZOFRAN) IV, sodium chloride  Diet:  NPO no liquids Activity:  Bedrest DVT Prophylaxis:  SCDs  CLINICALLY SIGNIFICANT STUDIES Basic Metabolic Panel:  Recent Labs Lab 07/25/13 0340 2013-07-25 1815 07/05/13 0245  NA 132* 128* 132*  K 5.4* 4.1 4.4  CL 91*  --  93*  CO2 30  --  26  GLUCOSE 251*  --  154*  BUN 64*  --  68*  CREATININE 1.21*  --  1.06  CALCIUM 8.4  --  7.7*  MG 2.0  --  1.9  PHOS 5.5*  --  4.6   Liver Function Tests:  Recent Labs Lab 07/25/13 0340 07/05/13 0245  AST 21 19  ALT 14 14  ALKPHOS 102 97  BILITOT 0.2* <0.2*  PROT 6.6 5.7*  ALBUMIN 1.8* 1.5*  CBC:  Recent Labs Lab 06/16/2013 2000 06/27/2013 0340 06/10/2013 1815 07/05/13 0245  WBC 18.0* 16.5*  --  17.5*  NEUTROABS 16.8*  --   --  16.3*  HGB 11.8* 11.4* 9.5* 10.0*  HCT 35.1* 34.1* 28.0* 30.7*  MCV 87.8 87.7  --  88.7  PLT 320 306  --  291   Coagulation:  Recent Labs Lab 06/30/2013 1230 06/29/13 0519 06/30/13 0500 07/01/13 0500  LABPROT 24.7* 29.2* 29.7* 13.5  INR 2.32* 2.89* 2.95* 1.05   Cardiac Enzymes: No results found for this basename: CKTOTAL, CKMB, CKMBINDEX, TROPONINI,  in the last 168 hours Urinalysis:  Recent Labs Lab 06/30/13 1058  COLORURINE YELLOW  LABSPEC 1.009  PHURINE 6.0  GLUCOSEU NEGATIVE  HGBUR LARGE*  BILIRUBINUR NEGATIVE  KETONESUR NEGATIVE  PROTEINUR NEGATIVE  UROBILINOGEN 0.2  NITRITE NEGATIVE  LEUKOCYTESUR LARGE*   Lipid Panel     Component Value Date/Time   CHOL 121 07/05/2013 0245   TRIG 367* 07/05/2013 0245   HDL 10* 07/05/2013 0245   CHOLHDL 12.1 07/05/2013 0245   VLDL 73* 07/05/2013 0245   LDLCALC 38 07/05/2013 0245   HgbA1C  Lab Results  Component Value Date   HGBA1C 6.2* 06/15/2013    Urine Drug Screen:   No results found for this basename: labopia, cocainscrnur, labbenz, amphetmu, thcu, labbarb    Alcohol Level: No results found for this basename: ETH,  in the last 168 hours  Ct Head Wo Contrast 06/25/2013    Evolving, acute left MCA infarct with new areas of contrast staining in the basal ganglia and left frontal lobe following catheter angiogram and new evidence of cytotoxic edema in the left temporal and parietal regions.      Ct Head Wo Contrast 07/02/2013    No acute intracranial process, specific no intracranial hemorrhage. If clinical assessment for acute ischemia, MRI of the brain with diffusion-weighted sequences could be performed.  Involutional changes. Moderate white matter changes suggest chronic small vessel ischemic disease, similar.     Dg Chest Port 1 View 07/02/2013    Bilateral pleural effusions. It is stable on the left and increased on the right.      Dg Chest Port 1 View 06/19/2013    1. The lungs are adequately inflated and exhibit increased interstitial markings which are not new. Confluent density at the left lung base is consistent with atelectasis or pneumonia. There may be a small amount of pleural fluid on the left as well. 2. The support tubes and lines appear to be in appropriate position.     MRI of the brain  pending  MRA of the brain    2D Echocardiogram - ejection fraction 65%  No cardiac source of emboli identified  Carotid Doppler    CXR    EKG Atrial fibrillation. Ventricular response 94 beats per minute. For complete results please see formal report.   Therapy Recommendations pending  Physical Exam   Mental Status:  Intubated, not following commands.   Cranial Nerves:  II: right hemianopsia, pupils equal, round, reactive to light and accommodation  III,IV, VI: ptosis not present, slight left gaze preference and does not cross midline with doll's maneuver.  V,VII: face symmetric but intubated, no wince to bilateral facial noxious stimuli  VIII: unable to asses  IX,X: gag reflex intact  XI: left shoulder shrug intact  XII: unable to assess  Motor:  Shows a dense right hemiplegia and moving left arm and leg antigravity.  Sensory: Pinprick and light touch intact  throughout, bilaterally  Deep Tendon Reflexes:  Right: Upper Extremity Left: Upper extremity  biceps (C-5 to C-6) 2/4 biceps (C-5 to C-6) 2/4  tricep (C7) 2/4 triceps (C7) 2/4  Brachioradialis (C6) 2/4 Brachioradialis (C6) 2/4  Lower Extremity Lower Extremity  quadriceps (L-2 to L-4) 2/4 quadriceps (L-2 to L-4) 2/4  Achilles (S1) 0/4 Achilles (S1) 0/4  Plantars:  Right: upgoing Left: downgoing  Cerebellar:  Unable to assess  Gait: unable to assess   ASSESSMENT Ms. Nolberto HanlonCynthia A Lozano is a 80 y.o. female presenting with right hemiparesis following surgery. TPA was not initiated secondary to recent surgery. Infarct felt to be embolic secondary to atrial fibrillation. Status post intra-arterial TPA by Dr. Corliss Skainseveshwar. CT - evolving, acute left MCA infarct with new areas of contrast staining in the basal ganglia and left frontal lobe following catheter angiogram and new evidence of cytotoxic edema in the left temporal and parietal regions. On warfarin prior to admission. Now on aspirin 325 mg orally every day for secondary stroke prevention. Patient with resultant right hemiparesis.. Stroke work up underway.   Atrial fibrillation - warfarin prior to admission  Status post sigmoid colectomy secondary to a perforated diverticulum.  Diabetes mellitus - hemoglobin A1c 6.2  COPD  Obesity  Hypertension  History of diverticular disease  Intubated  Hyponatremia  Elevated  BUN  Anemia  Leukocytosis  Cholesterol 121; LDL 38; triglycerides 367  Status post intra-arterial TPA performed by Dr. Corliss Skainseveshwar - unable to position clot retrieval device.   Hospital day # 7  TREATMENT/PLAN  Continue aspirin 325 mg orally every day for secondary stroke prevention.  Consider starting intravenous heparin tomorrow - Dr. Pearlean BrownieSethi  MRI pending  Therapy evaluations pending    Delton SeeDavid Rinehuls PA-C [facility] Pager [phone] 07/05/2013, 7:49 AM  I have personally obtained a history, examined the patient, evaluated imaging results, and formulated the assessment and plan of care. I agree with the above. Delia HeadyPramod Secundino Ellithorpe, MD   To contact Stroke Continuity provider, please refer to [email or web address]. After hours, contact General Neurology

## 2013-07-05 NOTE — Progress Notes (Signed)
I have had extensive discussions with family . We discussed patients current circumstances and organ failures. We also discussed patient's prior wishes under circumstances such as this. Family has decided to NOT perform resuscitation if arrest but to continue current medical support for now.' They will await MRI brain and neuro prognostication. BUT she id dependenrt for her care already 10 years, on TNA, ostomy etc and they will consider comfort in 24 hrs if MRI / neuro poor outcomr. She would NEVER want SNF, trach , long term support,. Heidi CritchleyKaty Weddle, RN attendance Mcarthur Rossettianiel J. Tyson AliasFeinstein, MD, FACP Pgr: [phone] [facility]]'

## 2013-07-05 NOTE — Progress Notes (Signed)
Patient ID: Heidi HanlonCynthia A Burch, female   DOB: 08/05/1930, 80 y.o.   MRN: [id number]  2 Days Post-Op Subjective: Pt remains sedated and on ventilator.  Objective: Vital signs in last 24 hours: Temp:  [97.5 F (36.4 C)-98.8 F (37.1 C)] 97.5 F (36.4 C) (03/27 0406) Pulse Rate:  [47-143] 65 (03/27 0500) Resp:  [11-21] 14 (03/27 0500) BP: (92-168)/(40-90) 108/57 mmHg (03/27 0500) SpO2:  [93 %-100 %] 100 % (03/27 0500) Arterial Line BP: (88-177)/(43-111) 141/58 mmHg (03/27 0000) FiO2 (%):  [40 %-100 %] 50 % (03/27 0311) Weight:  [88 kg (194 lb 0.1 oz)] 88 kg (194 lb 0.1 oz) (03/27 0430)  Intake/Output from previous day: 03/26 0701 - 03/27 0700 In: 1162.6 [I.V.:317.6; IV Piggyback:250; TPN:595] Out: 2195 [Urine:2055; Drains:10; Blood:130] Intake/Output this shift: Total I/O In: 50 [IV Piggyback:50] Out: 410 [Urine:380; Blood:30]  Physical Exam:  Abd: Soft, pelvic drain with minimal serosanguineous drainage  GU: Urine clear  Lab Results:  Recent Labs  06/21/2013 0340 06/26/2013 1815 07/05/13 0245  HGB 11.4* 9.5* 10.0*  HCT 34.1* 28.0* 30.7*   BMET  Recent Labs  06/11/2013 0340 06/26/2013 1815 07/05/13 0245  NA 132* 128* 132*  K 5.4* 4.1 4.4  CL 91*  --  93*  CO2 30  --  26  GLUCOSE 251*  --  154*  BUN 64*  --  68*  CREATININE 1.21*  --  1.06  CALCIUM 8.4  --  7.7*     Studies/Results:  Assessment/Plan: 80 year old with probable colovesical fistula (not able to be identified during exploration on 07/19/13) - Urine output has been very adequate with minimal output from pelvic drain.  If there is a small colovesical fistula, this hopefully will close after bowel diversion and with prolonged catheter drainage although there is certainly a possibility that she could develop a mature fistula requiring formal repair in the future.  Continue current care.  She will eventually need removal of her indwelling left ureteral stent.  Continue pelvic drain for now.  Will reassess next  week.   LOS: 7 days   Ahmari Duerson,LES 07/05/2013, 6:27 AM

## 2013-07-05 NOTE — Progress Notes (Signed)
Pulled Rt femoral 9 fr. Sheath.  No hematoma present before sheath pull.  7 fr Exoceal placed.  Held manual pressure for 10 minutes, no complications.  Pressure dressing applied with a sandbag.  [facility].  Genene ChurnJames M. Alycia RossettiKoch RT R [employer] & McLennanKris Hines RT R

## 2013-07-06 ENCOUNTER — Inpatient Hospital Stay (HOSPITAL_COMMUNITY): Payer: Medicare Other

## 2013-07-06 DIAGNOSIS — I6350 Cerebral infarction due to unspecified occlusion or stenosis of unspecified cerebral artery: Secondary | ICD-10-CM

## 2013-07-06 DIAGNOSIS — G9340 Encephalopathy, unspecified: Secondary | ICD-10-CM

## 2013-07-06 LAB — GLUCOSE, CAPILLARY
GLUCOSE-CAPILLARY: 150 mg/dL — AB (ref 70–99)
GLUCOSE-CAPILLARY: 157 mg/dL — AB (ref 70–99)
GLUCOSE-CAPILLARY: 180 mg/dL — AB (ref 70–99)
Glucose-Capillary: 141 mg/dL — ABNORMAL HIGH (ref 70–99)
Glucose-Capillary: 143 mg/dL — ABNORMAL HIGH (ref 70–99)
Glucose-Capillary: 145 mg/dL — ABNORMAL HIGH (ref 70–99)
Glucose-Capillary: 150 mg/dL — ABNORMAL HIGH (ref 70–99)
Glucose-Capillary: 162 mg/dL — ABNORMAL HIGH (ref 70–99)
Glucose-Capillary: 163 mg/dL — ABNORMAL HIGH (ref 70–99)
Glucose-Capillary: 167 mg/dL — ABNORMAL HIGH (ref 70–99)
Glucose-Capillary: 168 mg/dL — ABNORMAL HIGH (ref 70–99)

## 2013-07-06 LAB — CBC WITH DIFFERENTIAL/PLATELET
BASOS PCT: 0 % (ref 0–1)
Basophils Absolute: 0 10*3/uL (ref 0.0–0.1)
EOS ABS: 0 10*3/uL (ref 0.0–0.7)
Eosinophils Relative: 0 % (ref 0–5)
HCT: 29 % — ABNORMAL LOW (ref 36.0–46.0)
HEMOGLOBIN: 9.4 g/dL — AB (ref 12.0–15.0)
Lymphocytes Relative: 3 % — ABNORMAL LOW (ref 12–46)
Lymphs Abs: 0.6 10*3/uL — ABNORMAL LOW (ref 0.7–4.0)
MCH: 28.9 pg (ref 26.0–34.0)
MCHC: 32.4 g/dL (ref 30.0–36.0)
MCV: 89.2 fL (ref 78.0–100.0)
Monocytes Absolute: 0.7 10*3/uL (ref 0.1–1.0)
Monocytes Relative: 3 % (ref 3–12)
Neutro Abs: 19.2 10*3/uL — ABNORMAL HIGH (ref 1.7–7.7)
Neutrophils Relative %: 94 % — ABNORMAL HIGH (ref 43–77)
PLATELETS: 302 10*3/uL (ref 150–400)
RBC: 3.25 MIL/uL — ABNORMAL LOW (ref 3.87–5.11)
RDW: 20.1 % — AB (ref 11.5–15.5)
WBC: 20.5 10*3/uL — ABNORMAL HIGH (ref 4.0–10.5)

## 2013-07-06 LAB — COMPREHENSIVE METABOLIC PANEL
ALK PHOS: 103 U/L (ref 39–117)
ALT: 16 U/L (ref 0–35)
AST: 27 U/L (ref 0–37)
Albumin: 1.6 g/dL — ABNORMAL LOW (ref 3.5–5.2)
BUN: 66 mg/dL — ABNORMAL HIGH (ref 6–23)
CALCIUM: 7.8 mg/dL — AB (ref 8.4–10.5)
CHLORIDE: 96 meq/L (ref 96–112)
CO2: 22 meq/L (ref 19–32)
Creatinine, Ser: 0.99 mg/dL (ref 0.50–1.10)
GFR calc Af Amer: 60 mL/min — ABNORMAL LOW (ref >90)
GFR, EST NON AFRICAN AMERICAN: 52 mL/min — AB (ref >90)
GLUCOSE: 185 mg/dL — AB (ref 70–99)
Potassium: 4.6 mEq/L (ref 3.7–5.3)
SODIUM: 132 meq/L — AB (ref 137–147)
Total Protein: 5.9 g/dL — ABNORMAL LOW (ref 6.0–8.3)

## 2013-07-06 LAB — MAGNESIUM: Magnesium: 2 mg/dL (ref 1.5–2.5)

## 2013-07-06 LAB — HEPARIN LEVEL (UNFRACTIONATED): Heparin Unfractionated: <0.1 IU/mL — ABNORMAL LOW (ref 0.30–0.70)

## 2013-07-06 LAB — PHOSPHORUS: Phosphorus: 3.3 mg/dL (ref 2.3–4.6)

## 2013-07-06 MED ORDER — MORPHINE BOLUS VIA INFUSION
5.0000 mg | INTRAVENOUS | Status: DC | PRN
  Filled 2013-07-06: qty 20

## 2013-07-06 MED ORDER — M.V.I. ADULT IV INJ
INTRAVENOUS | Status: DC
  Filled 2013-07-06: qty 2000

## 2013-07-06 MED ORDER — MORPHINE SULFATE 10 MG/ML IJ SOLN
10.0000 mg/h | INTRAMUSCULAR | Status: DC
  Administered 2013-07-06: 10 mg/h via INTRAVENOUS
  Filled 2013-07-06: qty 10

## 2013-07-06 MED ORDER — FAT EMULSION 20 % IV EMUL
250.0000 mL | INTRAVENOUS | Status: DC
  Filled 2013-07-06: qty 250

## 2013-07-09 ENCOUNTER — Ambulatory Visit: Payer: Medicare Other | Admitting: Critical Care Medicine

## 2013-07-09 LAB — GLUCOSE, CAPILLARY
Glucose-Capillary: 158 mg/dL — ABNORMAL HIGH (ref 70–99)
Glucose-Capillary: 146 mg/dL — ABNORMAL HIGH (ref 70–99)

## 2013-07-10 NOTE — Progress Notes (Signed)
Stroke Team Progress Note  HISTORY [name] is an 80 y.o. female who underwent a SIGMOID COLECTOMY; HARTMANN'S PROCEDURE WITH COLOSTOMY; on 06/17/2013. She has known Afib and was taken off her coumadin prior to surgery. patient had been restarted on Heparin 07/07/2013 and was doing well. She was last seen normal at 1200 that day.  At 2:30 PT noted she was not moving her right side and called the nurse. On arrival patient was intubated, following no commands, had a slight left gaze deviation, showed a right hemiplegia and moving left arm and leg antigravity. Patient was not a tPA candidate but was in the window for IA therapy. Pateint was taken to CT which showed no intracranial bleed. IR was called and patient was brought straight to IR for cerebral angiogram.   Date last known well: Date: 06/15/2013  Time last known well: Time: 12:00  tPA Given: No: recent surgery, IV heparin.  NIHSS: 20  SUBJECTIVE The patient's family said to bedside. The patient is intubated. Repeat MRI overnight showed marked worsening of the infarct. Findings discussed with the family, patient will be made comfort care.   OBJECTIVE Most recent Vital Signs: Filed Vitals:   07/05/2013 0750 06/09/2013 0800 06/24/2013 0823 06/29/2013 0900  BP:  128/60  128/45  Pulse: 80 80  96  Temp:   97.5 F (36.4 C)   TempSrc:   Axillary   Resp: 16 15  15   Height:      Weight:      SpO2: 100% 100%  100%   CBG (last 3)   Recent Labs  06/11/2013 0629 06/14/2013 0730 06/12/2013 0826  GLUCAP 168* 162* 163*    IV Fluid Intake:   . sodium chloride 20 mL/hr at 07/02/2013 0826  . Marland KitchenTPN (CLINIMIX-E) Adult 75 mL/hr at 07/05/13 2328   And  . fat emulsion 250 mL (07/05/13 2329)  . Marland KitchenTPN (CLINIMIX-E) Adult     And  . fat emulsion    . insulin (NOVOLIN-R) infusion Stopped (07/05/2013 0945)  . niCARDipine Stopped (06/26/2013 0925)    MEDICATIONS  . antiseptic oral rinse  15 mL Mouth Rinse QID  . aspirin  325 mg Per Tube Daily  . budesonide   0.5 mg Nebulization BID  . chlorhexidine  15 mL Mouth Rinse BID  . ertapenem  1 g Intravenous Q24H  . fluconazole (DIFLUCAN) IV  200 mg Intravenous Q24H  . hydrocortisone sodium succinate  25 mg Intravenous Q6H  . levothyroxine  25 mcg Intravenous Daily  . metoprolol  5 mg Intravenous Q4H  . sodium chloride  10-40 mL Intracatheter Q12H  . vancomycin  500 mg Intravenous Q24H   PRN:  acetaminophen, acetaminophen, acetaminophen, acetaminophen, albuterol, barrier cream, morphine injection, ondansetron, ondansetron (ZOFRAN) IV, sodium chloride  Diet:  NPO no liquids Activity:  Bedrest DVT Prophylaxis:  SCDs  CLINICALLY SIGNIFICANT STUDIES Basic Metabolic Panel:   Recent Labs Lab 07/05/13 0245 07/05/13 1746 06/12/2013 0500  NA 132* 131* 132*  K 4.4 4.4 4.6  CL 93* 95* 96  CO2 26 23 22   GLUCOSE 154* 200* 185*  BUN 68* 69* 66*  CREATININE 1.06 1.02 0.99  CALCIUM 7.7* 7.6* 7.8*  MG 1.9 1.9 2.0  PHOS 4.6  --  3.3   Liver Function Tests:   Recent Labs Lab 07/05/13 0245 06/27/2013 0500  AST 19 27  ALT 14 16  ALKPHOS 97 103  BILITOT <0.2* <0.2*  PROT 5.7* 5.9*  ALBUMIN 1.5* 1.6*   CBC:  Recent Labs Lab 07/05/13 0245 06/19/2013 0500  WBC 17.5* 20.5*  NEUTROABS 16.3* 19.2*  HGB 10.0* 9.4*  HCT 30.7* 29.0*  MCV 88.7 89.2  PLT 291 302   Coagulation:   Recent Labs Lab 06/30/13 0500 07/01/13 0500  LABPROT 29.7* 13.5  INR 2.95* 1.05   Cardiac Enzymes: No results found for this basename: CKTOTAL, CKMB, CKMBINDEX, TROPONINI,  in the last 168 hours Urinalysis:   Recent Labs Lab 06/30/13 1058  COLORURINE YELLOW  LABSPEC 1.009  PHURINE 6.0  GLUCOSEU NEGATIVE  HGBUR LARGE*  BILIRUBINUR NEGATIVE  KETONESUR NEGATIVE  PROTEINUR NEGATIVE  UROBILINOGEN 0.2  NITRITE NEGATIVE  LEUKOCYTESUR LARGE*   Lipid Panel    Component Value Date/Time   CHOL 121 07/05/2013 0245   TRIG 367* 07/05/2013 0245   HDL 10* 07/05/2013 0245   CHOLHDL 12.1 07/05/2013 0245   VLDL 73*  07/05/2013 0245   LDLCALC 38 07/05/2013 0245   HgbA1C  Lab Results  Component Value Date   HGBA1C 6.2* 07-29-13    Urine Drug Screen:   No results found for this basename: labopia,  cocainscrnur,  labbenz,  amphetmu,  thcu,  labbarb    Alcohol Level: No results found for this basename: ETH,  in the last 168 hours  Ct Head Wo Contrast Jul 29, 2013    Evolving, acute left MCA infarct with new areas of contrast staining in the basal ganglia and left frontal lobe following catheter angiogram and new evidence of cytotoxic edema in the left temporal and parietal regions.      Ct Head Wo Contrast Jul 29, 2013    No acute intracranial process, specific no intracranial hemorrhage. If clinical assessment for acute ischemia, MRI of the brain with diffusion-weighted sequences could be performed.  Involutional changes. Moderate white matter changes suggest chronic small vessel ischemic disease, similar.     Dg Chest Port 1 View 2013/07/29    Bilateral pleural effusions. It is stable on the left and increased on the right.      Dg Chest Port 1 View 06/14/2013    1. The lungs are adequately inflated and exhibit increased interstitial markings which are not new. Confluent density at the left lung base is consistent with atelectasis or pneumonia. There may be a small amount of pleural fluid on the left as well. 2. The support tubes and lines appear to be in appropriate position.     MRI of the brain  07/05/2013:  1. Continued interval evolution of large left MCA territory infarct  with markedly interval increase in subfalcine herniation and  left-to-right midline shift, now measuring up to 2 cm. There is  asymmetric enlargement of the right lateral ventricle, worrisome for  developing entrapment.  2. 5 mm acute ischemic infarct within the right frontal lobe.   MRA of the brain    2D Echocardiogram - ejection fraction 65%  No cardiac source of emboli identified  Carotid Doppler    CXR    EKG  Atrial fibrillation. Ventricular response 94 beats per minute. For complete results please see formal report.   Therapy Recommendations pending  Physical Exam   Mental Status:  Intubated, not following commands.  Cranial Nerves:  II: pupils fixed and dilated III,IV, VI: does not cross midline with doll's maneuver.  V,VII: face symmetric but intubated, no wince to bilateral facial noxious stimuli  VIII: unable to asses  XII: unable to assess  Motor:  No spontaneous movement Sensory: no response to noxious stimuli Cerebellar:  Unable to assess  Gait:  unable to assess   ASSESSMENT [name] is a 80 y.o. female presenting with right hemiparesis following surgery. TPA was not initiated secondary to recent surgery. Infarct felt to be embolic secondary to atrial fibrillation. Status post intra-arterial TPA by Dr. Corliss Skainseveshwar. MRI - continued interval evolution of large left MCA infarct with marked interval increase in subfalcine herniation and left to right midline shift. Asymmetric enlargemetn of right lateral ventricle worrisome for developing entrapement. 5mm acute ischemic infarct within the right frontal lobe. On warfarin prior to admission. Now on aspirin 325 mg orally every day for secondary stroke prevention. Patient with resultant right hemiparesis.. Stroke work up underway.   Atrial fibrillation - warfarin prior to admission  Status post sigmoid colectomy secondary to a perforated diverticulum.  Diabetes mellitus - hemoglobin A1c 6.2  COPD  Obesity  Hypertension  History of diverticular disease  Intubated  Hyponatremia  Elevated BUN  Anemia  Leukocytosis  Cholesterol 121; LDL 38; triglycerides 367  Status post intra-arterial TPA performed by Dr. Corliss Skainseveshwar - unable to position clot retrieval device.   Hospital day # 8  TREATMENT/PLAN  Continue aspirin 325 mg orally every day for secondary stroke prevention.  Discussed with RN and family, plan is  for comfort care and planned extubation when family arrives    To contact Stroke Continuity provider, please refer to [email or web address]. After hours, contact General Neurology

## 2013-07-10 NOTE — Progress Notes (Signed)
Received order from physician to provide comfort care measures. Measures were initiated per families request. Prior to terminal extubation morphine drip was started per physician order. Following extubation patient was observed for signs of discomfort and pain and treated as needed. Monitor was placed in comfort care mode. At 1530 patient's oxygen saturation was dropping and asystole was observed on the monitor. This RN and Alvester Morinebbie Gregory, RN listened independently for 1 minute and confirmed the absence of breath and heart sounds. Time of death was noted as being 1538. Current illness/condition was not compatible with life.  Dr. Kendrick FriesMcQuaid was notified and acknowledged. Comfort was given to the family. Family denied any further assistance from nursing staff at this time. Post mortem care was completed and the patient was transported to the morgue without complication.

## 2013-07-10 NOTE — Progress Notes (Signed)
Wasted 70 mL of morphine with Alvester Morinebbie Gregory, RN.

## 2013-07-10 NOTE — Progress Notes (Signed)
PARENTERAL NUTRITION CONSULT NOTE - FOLLOW UP  Pharmacy Consult for TPN Indication: colocutaneous fistula  Allergies  Allergen Reactions  . Sulfonamide Derivatives Other (See Comments)    Childhood reaction    Patient Measurements: Height: 5\' 3"  (160 cm) Weight: 202 lb 6.1 oz (91.8 kg) IBW/kg (Calculated) : 52.4 Adjusted Body Weight: 65kg  Vital Signs: Temp: 98.6 F (37 C) (03/28 0323) Temp src: Oral (03/28 0323) BP: 150/79 mmHg (03/28 0700) Pulse Rate: 80 (03/28 0750) Intake/Output from previous day: 03/27 0701 - 03/28 0700 In: 4851.2 [I.V.:2848.6; IV Piggyback:250; TPN:1752.7] Out: 1690 [Urine:1665; Drains:25] Intake/Output from this shift:    Labs:  Recent Labs  07/05/2013 0340 07/05/2013 1815 07/05/13 0245 06/12/2013 0500  WBC 16.5*  --  17.5* 20.5*  HGB 11.4* 9.5* 10.0* 9.4*  HCT 34.1* 28.0* 30.7* 29.0*  PLT 306  --  291 302     Recent Labs  06/29/2013 0340  07/05/13 0245 07/05/13 1746 06/09/2013 0500  NA 132*  < > 132* 131* 132*  K 5.4*  < > 4.4 4.4 4.6  CL 91*  --  93* 95* 96  CO2 30  --  26 23 22   GLUCOSE 251*  --  154* 200* 185*  BUN 64*  --  68* 69* 66*  CREATININE 1.21*  --  1.06 1.02 0.99  CALCIUM 8.4  --  7.7* 7.6* 7.8*  MG 2.0  --  1.9 1.9 2.0  PHOS 5.5*  --  4.6  --  3.3  PROT 6.6  --  5.7*  --  5.9*  ALBUMIN 1.8*  --  1.5*  --  1.6*  AST 21  --  19  --  27  ALT 14  --  14  --  16  ALKPHOS 102  --  97  --  103  BILITOT 0.2*  --  <0.2*  --  <0.2*  TRIG  --   --  367*  --   --   CHOLHDL  --   --  12.1  --   --   CHOL  --   --  121  --   --   < > = values in this interval not displayed. Estimated Creatinine Clearance: 48 ml/min (by C-G formula based on Cr of 0.99).    Recent Labs  06/19/2013 0425 06/26/2013 0528 07/05/2013 0629  GLUCAP 157* 180* 168*   Insulin Requirements in the past 24 hours:   Lantus 10 units QHS + Insulin drip  Current Nutrition:  Clinimix 5/15 at 2775mL/hr + 20% lipid emulsion at 2210mL/hr on MWF only- this will  provide an average of 90 gm protein and 1484 kCal daily  Nutritional Goals:  1600-1800 kCal, 80-95 grams of protein per day per RD recs on 3/21  Admit:  83 YOF recently hospitaliazed for PNA and flu and developed diverticulitis with abscess which required drain placement. This week her drain was attempted to be upsized d/t stool in abscess cavity and she continued to have worsening pain and weakness.   GI: has been NPO except for medications; now s/p sigmoid colectomy/colostomy/I&D of left flank drain site on 3/25.   Prealbumin was 13.2 on 3/18- this admission it decreased to 10.5. IV pepcid in TPN; per [facility] note "hope to start TF tomorrow"  Endo: hx hypothyroid, TSH 06/22/2013 1.9, continues on Synthroid 25 IV; hx DM- 06/22/2013 A1C 7%. prednisone was changed to stress-dose hydrocortisone IV; insulin drip stopped and lantus 10 qhs and SSI started 3/27, but now back  on insulin drip; has 20 units/bag insulin in TPN plus still has an active Lantus order  Lytes: Na 132, K 4.6, Phos 3.3, Mag 2, CoCa = 9.7, tolerating TPN with electrolytes  Renal: hx CKD; SCr 1; UOP 0.8 ml/kg/hr.  + 4.2L on 3/27 - appears she had IV fluids started at 75 ml/hr post IR procedure.  It appears she is ready for a decrease to [facility] with TPN infusing as well.  Pulm: on vent post-op; budesonide, duonebs  Cards: VSS, bisoprolol changed to IV Metoprolol  Hepatobil: LFTs wnl except albumin is low at 1.6. Triglycerides remain elevated at 367.  Lipid provisions were adjusted 3/23.  Neuro:  A&O  ID: was on Flagyl and tigecycline at [facility], changed to ertapenem and vancomycin here- D#9 for both of those. WBC 20.5 afebrile.  Her Vancomycin regimen was adjusted 3/24 for a slightly elevated trough.  Anticoagulation: Afib - heparin was infusing previously for bridging while Coumadin is on hold, was off 3/25 for surgery and was restarted 3/26 AM; now off s/p tPA in IR 3/26 for stroke  TPN Access: R PICC  TPN day#: 11 (started 3/17  in [facility])  Plan:  Continue Clinimix E 5/15 at 85mL/hr. Continue 20% lipid emulsion at 60mL/hr on MWF only as triglycerides remain elevated.  This will provide an averagt of 90 gm protein and 1484 kCal per day. Pepcid in TPN bag. Daily IV Multivitamin in TPN; trace elements on MWF only due to national shortage Decrease MIVF to 0.9% sodium chloride at [facility]. Continue with 20 units of insulin in TPN bag. Discontinue Lantus order as she is on an insulin drip. CMET, Mag, Phos tomorrow morning  Estella Husk, [place].D., BCPS, AAHIVP Clinical Pharmacist Phone: [phone] or [phone] 07/12/13, 8:16 AM

## 2013-07-10 NOTE — Progress Notes (Signed)
Patient's MRI has changed extensively.  Comfort care only.  Marta LamasJames O. Gae BonWyatt, III, MD, FACS [phone]--pager [phone]--office [facility]

## 2013-07-10 NOTE — Procedures (Signed)
Extubation Procedure Note  Patient Details:   Name: [name] DOB: 04/21/1930 MRN: [id number]   Airway Documentation:     Evaluation  O2 sats: stable throughout Complications: No apparent complications Patient did tolerate procedure well. Bilateral Breath Sounds: Rhonchi Suctioning: Airway No  Pt terminally extubated per MD order.  RN at bedside.  RT will continue to monitor.  Closson, Terie PurserMorgan Gregg Holster 06/26/2013, 2:40 PM

## 2013-07-10 NOTE — Progress Notes (Signed)
PULMONARY / CRITICAL CARE MEDICINE  Name: [name] MRN: [id number] DOB: 01/07/1931    ADMISSION DATE:  07/05/2013 CONSULTATION DATE: 06/13/2013  REFERRING MD :  EDP PRIMARY SERVICE:  PCCM  CHIEF COMPLAINT:  Chronic lung disease / perioperative management  BRIEF PATIENT DESCRIPTION:  80 yo with COPD admitted 3/20 with diverticular abscess s/p sigmoid colectomy, colostomy, cystoscopy and urethral stent placement.  SIGNIFICANT EVENTS / STUDIES:  3/25  OR >>>  Sigmoid colectomy, colostomy, cystoscopy and urethral stent placement. 3/26- MCi stroke, IR, vent  3/27 MRI Brain > large L MCA distribution stroke with midline shift  LINES / TUBES: OETT 3/25 >>> OGT 3/25 >>> Foley 3/25 >>> R IJ CVL 3/25 >>> R PICC ??? >>> L R A-line 3/25 >>>  CULTURES: 3/22 Urine >>> YEAST  ANTIBIOTICS: Invanz 3/25 >>> Vancomycin 3/25 >>> Diflucan 3/25 >>>  INTERVAL HISTORY:  Unresponsive on vent  VITAL SIGNS: Temp:  [97.5 F (36.4 C)-98.6 F (37 C)] 97.5 F (36.4 C) (03/28 0823) Pulse Rate:  [46-96] 96 (03/28 0900) Resp:  [13-19] 15 (03/28 0900) BP: (97-160)/(41-81) 128/45 mmHg (03/28 0900) SpO2:  [98 %-100 %] 100 % (03/28 0900) Arterial Line BP: (121-167)/(52-81) 132/54 mmHg (03/28 0900) FiO2 (%):  [40 %] 40 % (03/28 0750) Weight:  [91.8 kg (202 lb 6.1 oz)] 91.8 kg (202 lb 6.1 oz) (03/28 0400) HEMODYNAMICS:   VENTILATOR SETTINGS: Vent Mode:  [-] PRVC FiO2 (%):  [40 %] 40 % Set Rate:  [14 bmp] 14 bmp Vt Set:  [500 mL] 500 mL PEEP:  [5 cmH20] 5 cmH20 Plateau Pressure:  [12 cmH20-17 cmH20] 15 cmH20 INTAKE / OUTPUT: Intake/Output     03/27 0701 - 03/28 0700 03/28 0701 - 03/29 0700   I.V. (mL/kg) 2848.6 (31) 197 (2.1)   IV Piggyback 250    TPN 1752.7    Total Intake(mL/kg) 4851.2 (52.8) 197 (2.1)   Urine (mL/kg/hr) 1665 (0.8) 325 (1.2)   Drains 25 (0)    Blood     Total Output 1690 325   Net +3161.2 -128          PHYSICAL EXAMINATION:  Gen: unresponsive on vent HEENT:  NCAT, pupils dilated and unresponsive to light, ETT in place PULM: few crackles in bases CV: Irreg irreg, no mgr, cannot assess JVD AB: BS+, soft, nontender, no hsm Ext: warm, pretibial edema, no clubbing, no cyanosis Derm: no rash or skin breakdown Neuro: GCS 3, no response to pain   LABS: CBC  Recent Labs Lab 06/11/2013 0340 06/22/2013 1815 07/05/13 0245 May 15, 2013 0500  WBC 16.5*  --  17.5* 20.5*  HGB 11.4* 9.5* 10.0* 9.4*  HCT 34.1* 28.0* 30.7* 29.0*  PLT 306  --  291 302   Coag's  Recent Labs Lab 06/30/13 0500 07/01/13 0500  INR 2.95* 1.05   BMET  Recent Labs Lab 07/05/13 0245 07/05/13 1746 May 15, 2013 0500  NA 132* 131* 132*  K 4.4 4.4 4.6  CL 93* 95* 96  CO2 26 23 22   BUN 68* 69* 66*  CREATININE 1.06 1.02 0.99  GLUCOSE 154* 200* 185*   Electrolytes  Recent Labs Lab 07/05/2013 0340 07/05/13 0245 07/05/13 1746 May 15, 2013 0500  CALCIUM 8.4 7.7* 7.6* 7.8*  MG 2.0 1.9 1.9 2.0  PHOS 5.5* 4.6  --  3.3   Sepsis Markers No results found for this basename: LATICACIDVEN, PROCALCITON, O2SATVEN,  in the last 168 hours ABG  Recent Labs Lab 06/25/2013 1649 06/27/2013 0512 06/27/2013 1815  PHART 7.416 7.352 7.412  PCO2ART 51.5* 53.0* 45.5*  PO2ART 137.0* 118.0* 181.0*   Liver Enzymes  Recent Labs Lab 06/16/2013 0340 07/05/13 0245 07/02/2013 0500  AST 21 19 27   ALT 14 14 16   ALKPHOS 102 97 103  BILITOT 0.2* <0.2* <0.2*  ALBUMIN 1.8* 1.5* 1.6*   Cardiac Enzymes No results found for this basename: TROPONINI, PROBNP,  in the last 168 hours Glucose  Recent Labs Lab 06/16/2013 0321 07/05/2013 0425 06/15/2013 0528 07/09/2013 0629 07/03/2013 0730 07/04/2013 0826  GLUCAP 143* 157* 180* 168* 162* 163*   IMAGING:  ASSESSMENT / PLAN:  PULMONARY A:   Acute respiratory failure > inability to protect airway from massive stroke COPD without exacerbation    CARDIOVASCULAR A:  AF HTN Tachycardia Chronic diastolic heart failure    RENAL A:   CKD Bilateral  urethral stents placement Hematuria, improving   GASTROINTESTINAL A:   Diverticulitis with abscess / fistula s/o sigmoid colectomy, colostomy Nutrition GI Px   HEMATOLOGIC A:   Therapeutic anticoagulation for EF prior to surgery VTE Px is not indicated   INFECTIOUS A:   Intraabdominal abscess   ENDOCRINE  A:   DM Hyperglycemia Hypothyroidism Suspected adrenal insufficiency - chronic steroids   NEUROLOGIC A:   Acute large CVA brain with midline shift and comatose state on exam  Family updated extensively on rounds, they understand the severity of this stroke and that she will not survive.  They state that she would not want to be maintained on life support in this situation.  Plan:   Plan one way extubation and comfort measures only today after sister arrives  Code: dnr, comfort only  I have personally obtained history, examined patient, evaluated and interpreted laboratory and imaging results, reviewed medical records, formulated assessment / plan and placed orders.  CRITICAL CARE:  The patient is critically ill with multiple organ systems failure and requires high complexity decision making for assessment and support, frequent evaluation and titration of therapies, application of advanced monitoring technologies and extensive interpretation of multiple databases. Critical Care Time devoted to patient care services described in this note is 35 minutes.   Max Fickle, MD Pulmonary and Critical Care Medicine [facility] Pager: [phone]  06/13/2013, 10:03 AM

## 2013-07-10 NOTE — Progress Notes (Signed)
Spoke with family regarding current care. Related that MRI scan was worse and that patient's pupils were no longer responsive which indicated declining neuro status. Family stated they would be here first thing in the morning.

## 2013-07-10 DEATH — deceased

## 2013-07-19 NOTE — Discharge Summary (Signed)
NAMShellia Carwin:  Burch, Heidi             ACCOUNT NO.:  [id number]  MEDICAL RECORD NO.:  [id number]  LOCATION:  3M05C                        FACILITY:  [facility]  PHYSICIAN:  Veto KempsUGLAS BRENT MCQUAID, MDDATE OF BIRTH:  12/27/1930  DATE OF ADMISSION:  06/12/2013 DATE OF DISCHARGE:  09-27-2013                              DISCHARGE SUMMARY   DEATH NOTE:  DATE OF DEATH:  July 06, 2013.  CAUSE OF DEATH:  Large cerebrovascular accident.  SECONDARY DIAGNOSIS:  Diverticular abscess.  HISTORY OF PRESENT ILLNESS:  This is an 80 year old female with a past medical history significant for COPD who was admitted on June 28, 2013, with a diverticular abscess requiring a sigmoid colectomy and colostomy, as well as cystoscopy and a ureteral stent placement.  Please see the admission H and P for further details.  PAST MEDICAL HISTORY, FAMILY HISTORY, SOCIAL HISTORY, HOME MEDICATIONS, ALLERGIES, AND REVIEW OF SYSTEMS:  See the admission H and P.  PHYSICAL EXAMINATION:  VITAL SIGNS:  On the day of death, temperature 97.5, heart rate 96, respirations 15, blood pressure 128/45, blood pressure 132/54, FiO2 is 40%, SpO2 is 100%. GENERAL:  Unresponsive on that. HEENT:  [place]/AT.  Pupils dilated and unresponsive to light.  Endotracheal tube is in place. PULMONARY:  Two crackles in bases. CARDIOVASCULAR:  Irregular, regular.  No murmurs, gallops or rubs. Cannot assess JVD. ABDOMEN:  Bowel sounds are positive.  Soft, nontender, nondistended.  No hepatosplenomegaly. EXTREMITIES:  Warm.  Pretibial edema is noted.  No clubbing, no cyanosis. SKIN:  No rash or skin breakdown. NEUROLOGIC:  Glasgow score is 3.  There is no response to pain of any kind.  HOSPITAL COURSE:  The patient was initially admitted on June 28, 2013, for diverticular abscess and successfully underwent surgical treatment. However, unfortunately near the time of hospital discharge, after recovering from the surgery, she developed a large left  MCA distribution stroke that led to cerebral edema and progressive worsening neurologic status.  Neurology was consulted and the patient's family was updated with findings of an MRI which showed severe damage from the left MCA stroke.  It was felt there is no chance of meaningful neurologic recovery and said the patient's family elected to withdraw care on July 06, 2013.          ______________________________ Veto KempsUGLAS BRENT MCQUAID, MD     DBM/MEDQ  D:  07/18/2013  T:  07/19/2013  Job:  [id number]

## 2013-08-09 DEATH — deceased

## 2013-08-16 ENCOUNTER — Ambulatory Visit: Payer: Medicare Other | Admitting: Cardiology

## 2013-08-20 NOTE — Progress Notes (Signed)
Puncture time 1533

## 2014-04-24 ENCOUNTER — Encounter (HOSPITAL_COMMUNITY): Payer: Self-pay | Admitting: Interventional Radiology
